# Patient Record
Sex: Male | Born: 1945 | Race: White | Hispanic: No | Marital: Married | Smoking: Former smoker
Health system: Southern US, Community
[De-identification: ages and names within clinical notes are randomized; demographics above are authoritative.]

## PROBLEM LIST (undated history)

## (undated) DIAGNOSIS — I10 Essential (primary) hypertension: Secondary | ICD-10-CM

## (undated) DIAGNOSIS — I2510 Atherosclerotic heart disease of native coronary artery without angina pectoris: Secondary | ICD-10-CM

## (undated) DIAGNOSIS — N400 Enlarged prostate without lower urinary tract symptoms: Secondary | ICD-10-CM

## (undated) DIAGNOSIS — E119 Type 2 diabetes mellitus without complications: Secondary | ICD-10-CM

## (undated) DIAGNOSIS — K08109 Complete loss of teeth, unspecified cause, unspecified class: Secondary | ICD-10-CM

## (undated) DIAGNOSIS — K219 Gastro-esophageal reflux disease without esophagitis: Secondary | ICD-10-CM

## (undated) DIAGNOSIS — M5030 Other cervical disc degeneration, unspecified cervical region: Secondary | ICD-10-CM

## (undated) DIAGNOSIS — I779 Disorder of arteries and arterioles, unspecified: Secondary | ICD-10-CM

## (undated) DIAGNOSIS — J189 Pneumonia, unspecified organism: Secondary | ICD-10-CM

## (undated) DIAGNOSIS — M1990 Unspecified osteoarthritis, unspecified site: Secondary | ICD-10-CM

## (undated) DIAGNOSIS — J449 Chronic obstructive pulmonary disease, unspecified: Secondary | ICD-10-CM

## (undated) DIAGNOSIS — R0600 Dyspnea, unspecified: Secondary | ICD-10-CM

## (undated) DIAGNOSIS — Z7901 Long term (current) use of anticoagulants: Secondary | ICD-10-CM

## (undated) DIAGNOSIS — E876 Hypokalemia: Secondary | ICD-10-CM

## (undated) DIAGNOSIS — I700 Atherosclerosis of aorta: Secondary | ICD-10-CM

## (undated) DIAGNOSIS — Z7902 Long term (current) use of antithrombotics/antiplatelets: Secondary | ICD-10-CM

## (undated) DIAGNOSIS — I219 Acute myocardial infarction, unspecified: Secondary | ICD-10-CM

## (undated) DIAGNOSIS — N200 Calculus of kidney: Secondary | ICD-10-CM

## (undated) DIAGNOSIS — E7800 Pure hypercholesterolemia, unspecified: Secondary | ICD-10-CM

## (undated) DIAGNOSIS — N189 Chronic kidney disease, unspecified: Secondary | ICD-10-CM

## (undated) DIAGNOSIS — K5790 Diverticulosis of intestine, part unspecified, without perforation or abscess without bleeding: Secondary | ICD-10-CM

## (undated) DIAGNOSIS — N23 Unspecified renal colic: Secondary | ICD-10-CM

## (undated) DIAGNOSIS — C3490 Malignant neoplasm of unspecified part of unspecified bronchus or lung: Secondary | ICD-10-CM

## (undated) HISTORY — PX: CATARACT EXTRACTION, BILATERAL: SHX1313

## (undated) HISTORY — DX: Type 2 diabetes mellitus without complications: E11.9

## (undated) HISTORY — DX: Malignant neoplasm of unspecified part of unspecified bronchus or lung: C34.90

---

## 2005-04-27 ENCOUNTER — Ambulatory Visit: Payer: Self-pay | Admitting: General Surgery

## 2005-04-27 HISTORY — PX: COLONOSCOPY: SHX174

## 2005-11-15 DIAGNOSIS — E876 Hypokalemia: Secondary | ICD-10-CM | POA: Insufficient documentation

## 2005-11-15 DIAGNOSIS — I10 Essential (primary) hypertension: Secondary | ICD-10-CM | POA: Insufficient documentation

## 2006-07-19 DIAGNOSIS — D239 Other benign neoplasm of skin, unspecified: Secondary | ICD-10-CM | POA: Insufficient documentation

## 2008-02-14 ENCOUNTER — Emergency Department: Payer: Self-pay | Admitting: Emergency Medicine

## 2008-09-07 ENCOUNTER — Ambulatory Visit: Payer: Self-pay | Admitting: Family Medicine

## 2010-12-10 ENCOUNTER — Inpatient Hospital Stay: Payer: Self-pay | Admitting: Internal Medicine

## 2010-12-10 DIAGNOSIS — I214 Non-ST elevation (NSTEMI) myocardial infarction: Secondary | ICD-10-CM

## 2010-12-10 HISTORY — DX: Non-ST elevation (NSTEMI) myocardial infarction: I21.4

## 2010-12-11 DIAGNOSIS — Z955 Presence of coronary angioplasty implant and graft: Secondary | ICD-10-CM

## 2010-12-11 HISTORY — PX: CORONARY ANGIOPLASTY WITH STENT PLACEMENT: SHX49

## 2010-12-11 HISTORY — DX: Presence of coronary angioplasty implant and graft: Z95.5

## 2011-01-05 ENCOUNTER — Ambulatory Visit: Payer: Self-pay | Admitting: Internal Medicine

## 2011-01-05 HISTORY — PX: CORONARY STENT INTERVENTION: CATH118234

## 2011-03-06 DIAGNOSIS — I219 Acute myocardial infarction, unspecified: Secondary | ICD-10-CM

## 2011-03-06 HISTORY — PX: CARDIAC CATHETERIZATION: SHX172

## 2011-03-06 HISTORY — DX: Acute myocardial infarction, unspecified: I21.9

## 2011-07-09 ENCOUNTER — Emergency Department: Payer: Self-pay | Admitting: Emergency Medicine

## 2011-07-09 LAB — URINALYSIS, COMPLETE
Bacteria: NONE SEEN
Bilirubin,UR: NEGATIVE
Glucose,UR: NEGATIVE mg/dL (ref 0–75)
Ketone: NEGATIVE
Nitrite: NEGATIVE
Protein: NEGATIVE
RBC,UR: 150 /HPF (ref 0–5)
Specific Gravity: 1.015 (ref 1.003–1.030)
Squamous Epithelial: 1

## 2011-07-09 LAB — LIPASE, BLOOD: Lipase: 180 U/L (ref 73–393)

## 2011-07-09 LAB — COMPREHENSIVE METABOLIC PANEL
Albumin: 4.3 g/dL (ref 3.4–5.0)
Alkaline Phosphatase: 96 U/L (ref 50–136)
Anion Gap: 8 (ref 7–16)
BUN: 19 mg/dL — ABNORMAL HIGH (ref 7–18)
Bilirubin,Total: 0.6 mg/dL (ref 0.2–1.0)
Calcium, Total: 9.2 mg/dL (ref 8.5–10.1)
Co2: 29 mmol/L (ref 21–32)
Creatinine: 1.85 mg/dL — ABNORMAL HIGH (ref 0.60–1.30)
EGFR (African American): 43 — ABNORMAL LOW
EGFR (Non-African Amer.): 37 — ABNORMAL LOW
Potassium: 4.3 mmol/L (ref 3.5–5.1)
SGPT (ALT): 30 U/L
Sodium: 141 mmol/L (ref 136–145)
Total Protein: 8 g/dL (ref 6.4–8.2)

## 2011-07-09 LAB — CBC
HCT: 48 % (ref 40.0–52.0)
MCH: 29.3 pg (ref 26.0–34.0)
WBC: 10.1 10*3/uL (ref 3.8–10.6)

## 2011-09-21 ENCOUNTER — Emergency Department: Payer: Self-pay | Admitting: Emergency Medicine

## 2011-09-21 LAB — CBC
HCT: 46.8 % (ref 40.0–52.0)
MCH: 29.5 pg (ref 26.0–34.0)
MCV: 89 fL (ref 80–100)
RDW: 14.7 % — ABNORMAL HIGH (ref 11.5–14.5)
WBC: 10.5 10*3/uL (ref 3.8–10.6)

## 2011-09-21 LAB — URINALYSIS, COMPLETE
Ketone: NEGATIVE
Nitrite: NEGATIVE
Ph: 5 (ref 4.5–8.0)
Protein: NEGATIVE
Specific Gravity: 1.024 (ref 1.003–1.030)
WBC UR: 4 /HPF (ref 0–5)

## 2011-09-21 LAB — COMPREHENSIVE METABOLIC PANEL
Bilirubin,Total: 0.6 mg/dL (ref 0.2–1.0)
Chloride: 105 mmol/L (ref 98–107)
Co2: 30 mmol/L (ref 21–32)
EGFR (African American): 38 — ABNORMAL LOW
EGFR (Non-African Amer.): 33 — ABNORMAL LOW
SGOT(AST): 25 U/L (ref 15–37)
SGPT (ALT): 34 U/L
Total Protein: 7.7 g/dL (ref 6.4–8.2)

## 2011-10-02 ENCOUNTER — Emergency Department: Payer: Self-pay | Admitting: Emergency Medicine

## 2011-10-02 LAB — BASIC METABOLIC PANEL
BUN: 21 mg/dL — ABNORMAL HIGH (ref 7–18)
Calcium, Total: 8.4 mg/dL — ABNORMAL LOW (ref 8.5–10.1)
Chloride: 106 mmol/L (ref 98–107)
Creatinine: 1.88 mg/dL — ABNORMAL HIGH (ref 0.60–1.30)
EGFR (African American): 42 — ABNORMAL LOW
EGFR (Non-African Amer.): 36 — ABNORMAL LOW
Glucose: 97 mg/dL (ref 65–99)
Potassium: 4.2 mmol/L (ref 3.5–5.1)
Sodium: 139 mmol/L (ref 136–145)

## 2011-10-02 LAB — CBC
HCT: 42 % (ref 40.0–52.0)
HGB: 14.3 g/dL (ref 13.0–18.0)
MCH: 29.9 pg (ref 26.0–34.0)
MCHC: 34.1 g/dL (ref 32.0–36.0)
MCV: 88 fL (ref 80–100)
RBC: 4.78 10*6/uL (ref 4.40–5.90)

## 2011-10-02 LAB — URINALYSIS, COMPLETE
Bacteria: NONE SEEN
Bilirubin,UR: NEGATIVE
Glucose,UR: NEGATIVE mg/dL (ref 0–75)
Ketone: NEGATIVE
Ph: 5 (ref 4.5–8.0)
Protein: NEGATIVE
RBC,UR: 5 /HPF (ref 0–5)
Squamous Epithelial: <1

## 2011-10-10 ENCOUNTER — Ambulatory Visit: Payer: Self-pay | Admitting: Urology

## 2011-10-11 ENCOUNTER — Ambulatory Visit: Payer: Self-pay | Admitting: Urology

## 2014-05-20 DIAGNOSIS — S76011A Strain of muscle, fascia and tendon of right hip, initial encounter: Secondary | ICD-10-CM | POA: Diagnosis not present

## 2014-05-31 DIAGNOSIS — N390 Urinary tract infection, site not specified: Secondary | ICD-10-CM | POA: Diagnosis not present

## 2014-07-02 DIAGNOSIS — I10 Essential (primary) hypertension: Secondary | ICD-10-CM | POA: Diagnosis not present

## 2014-07-02 DIAGNOSIS — I252 Old myocardial infarction: Secondary | ICD-10-CM | POA: Diagnosis not present

## 2014-07-02 DIAGNOSIS — E782 Mixed hyperlipidemia: Secondary | ICD-10-CM | POA: Diagnosis not present

## 2014-07-08 DIAGNOSIS — E782 Mixed hyperlipidemia: Secondary | ICD-10-CM | POA: Diagnosis not present

## 2014-07-08 DIAGNOSIS — I10 Essential (primary) hypertension: Secondary | ICD-10-CM | POA: Diagnosis not present

## 2014-08-05 DIAGNOSIS — E669 Obesity, unspecified: Secondary | ICD-10-CM | POA: Diagnosis not present

## 2014-08-05 DIAGNOSIS — I2510 Atherosclerotic heart disease of native coronary artery without angina pectoris: Secondary | ICD-10-CM | POA: Diagnosis not present

## 2014-08-05 DIAGNOSIS — I209 Angina pectoris, unspecified: Secondary | ICD-10-CM | POA: Diagnosis not present

## 2014-08-05 DIAGNOSIS — E785 Hyperlipidemia, unspecified: Secondary | ICD-10-CM | POA: Diagnosis not present

## 2014-10-06 ENCOUNTER — Telehealth: Payer: Self-pay

## 2014-10-06 ENCOUNTER — Other Ambulatory Visit: Payer: Self-pay

## 2014-10-06 NOTE — Telephone Encounter (Signed)
Refill request received from [facility] requesting Naproxen 500 mg.

## 2014-10-07 MED ORDER — NAPROXEN 500 MG PO TABS: 500.0000 mg | ORAL_TABLET | Freq: Two times a day (BID) | ORAL | Status: DC

## 2014-10-08 ENCOUNTER — Ambulatory Visit (INDEPENDENT_AMBULATORY_CARE_PROVIDER_SITE_OTHER): Payer: Medicare Other | Admitting: Family Medicine

## 2014-10-08 ENCOUNTER — Encounter: Payer: Self-pay | Admitting: Family Medicine

## 2014-10-08 ENCOUNTER — Other Ambulatory Visit: Payer: Self-pay | Admitting: Family Medicine

## 2014-10-08 VITALS — BP 140/78 | HR 61 | Temp 97.8°F | Resp 16 | Wt 200.0 lb

## 2014-10-08 DIAGNOSIS — R319 Hematuria, unspecified: Secondary | ICD-10-CM

## 2014-10-08 DIAGNOSIS — M1990 Unspecified osteoarthritis, unspecified site: Secondary | ICD-10-CM | POA: Insufficient documentation

## 2014-10-08 DIAGNOSIS — I10 Essential (primary) hypertension: Secondary | ICD-10-CM

## 2014-10-08 DIAGNOSIS — E785 Hyperlipidemia, unspecified: Secondary | ICD-10-CM | POA: Insufficient documentation

## 2014-10-08 DIAGNOSIS — K219 Gastro-esophageal reflux disease without esophagitis: Secondary | ICD-10-CM | POA: Insufficient documentation

## 2014-10-08 DIAGNOSIS — I252 Old myocardial infarction: Secondary | ICD-10-CM | POA: Insufficient documentation

## 2014-10-08 LAB — POCT URINALYSIS DIPSTICK
BILIRUBIN UA: NEGATIVE
Blood, UA: INVALID
Glucose, UA: NEGATIVE
Ketones, UA: NEGATIVE
Leukocytes, UA: NEGATIVE
NITRITE UA: NEGATIVE
PH UA: 6.5
Protein, UA: NEGATIVE
Spec Grav, UA: 1.01
UROBILINOGEN UA: 0.2

## 2014-10-08 MED ORDER — CIPROFLOXACIN HCL 500 MG PO TABS: 500.0000 mg | ORAL_TABLET | Freq: Two times a day (BID) | ORAL | Status: DC

## 2014-10-08 NOTE — Progress Notes (Signed)
Patient ID: Luke Rojas, male   DOB: 1945-11-24, 69 y.o.   MRN: [id number]   Patient: Luke Rojas Male    DOB: 09-Feb-1946   69 y.o.   MRN: [id number] Visit Date: 10/08/2014  Today's Provider: Vernie Murders, PA   Chief Complaint  Patient presents with  . Hematuria    X 2 days   Subjective:    HPI This 69 year old male noticed blood in urine yesterday without discomfort. Has had multiple kidney stones in the past. No nausea, vomiting, dysuria or urgency. Still has some decrease in stream intermittently. No nocturia.  Patient Active Problem List   Diagnosis Date Noted  . Arthritis 10/08/2014  . H/O acute myocardial infarction 10/08/2014  . Acid reflux 10/08/2014  . HLD (hyperlipidemia) 10/08/2014  . BP (high blood pressure) 10/08/2014  . 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 Benign neoplasm of skin 07/19/2006  . Essential (primary) hypertension 11/15/2005  . Decreased potassium in the blood 11/15/2005   Past Surgical History  Procedure Laterality Date  . No past surgeries     Family History  Problem Relation Age of Onset  . Pulmonary embolism Mother   . Transient ischemic attack Mother   . Pancreatic cancer Father   . Hypertension Father   . Cirrhosis Brother    Allergies  Allergen Reactions  . Codeine Nausea Only      Previous Medications   ASPIRIN 81 MG TABLET    Take 1 tablet by mouth daily.   ATORVASTATIN (LIPITOR) 40 MG TABLET    Take 1 tablet by mouth daily.   METOPROLOL (LOPRESSOR) 50 MG TABLET    Take 1 tablet by mouth. Morning and night   MULTIPLE VITAMIN PO    Take 1 tablet by mouth daily.   NAPROXEN (NAPROSYN) 500 MG TABLET    Take 1 tablet (500 mg total) by mouth 2 (two) times daily.   NITROGLYCERIN (NITROSTAT) 0.4 MG SL TABLET    Place 1 tablet under the tongue. Every 5 minutes X 3 PRN chest pain   OMEGA-3 FATTY ACIDS (FISH OIL) 1200 MG CAPS    Take 1 capsule by mouth 2 (two) times daily.   RAMIPRIL (ALTACE) 10 MG  CAPSULE    Take 1 capsule by mouth daily.    Review of Systems  Constitutional: Negative.   HENT: Negative.   Respiratory: Negative.   Cardiovascular: Negative.   Gastrointestinal: Negative.   Genitourinary: Positive for hematuria. Negative for dysuria, urgency, frequency and flank pain.  Musculoskeletal: Positive for arthralgias.       Stiffness and ache/soreness in both hands. Controlled by use of Naproxen.   History  Substance Use Topics  . Smoking status: Former Research scientist (life sciences)  . Smokeless tobacco: Not on file     Comment: [place]  . Alcohol Use: No   Objective:   BP 140/78 mmHg  Pulse 61  Temp(Src) 97.8 F (36.6 C) (Oral)  Resp 16  Wt 200 lb (90.719 kg)  SpO2 97%  Physical Exam  Constitutional: He is oriented to person, place, and time. He appears well-developed and well-nourished. No distress.  HENT:  Head: Normocephalic and atraumatic.  Right Ear: Hearing normal.  Left Ear: Hearing normal.  Nose: Nose normal.  Mouth/Throat: Oropharynx is clear and moist.  Eyes: Conjunctivae, EOM and lids are normal. Right eye exhibits no discharge. Left eye exhibits no discharge. No scleral icterus.  Cardiovascular: Normal rate and regular rhythm.   Pulmonary/Chest: Effort normal and  breath sounds normal. No respiratory distress.  Abdominal: Soft. Bowel sounds are normal.  Genitourinary: Rectum normal, prostate normal and penis normal. Guaiac negative stool.  Musculoskeletal: Normal range of motion.  Neurological: He is alert and oriented to person, place, and time.  Skin: Skin is intact. No lesion and no rash noted.  Psychiatric: He has a normal mood and affect. His speech is normal and behavior is normal. Thought content normal.      Assessment & Plan:        1. Hematuria Onset yesterday with very little symptoms. No pain or nausea. History of past renal stones. Will get C&S and start antibiotic. Increase fluid intake and if no sign of infection will need to get renal  ultrasound. May need referral to urologist. If pain develops or hematuria worsens, should go to ER. - POCT urinalysis dipstick - Urine Culture - ciprofloxacin (CIPRO) 500 MG tablet; Take 1 tablet (500 mg total) by mouth 2 (two) times daily.  Dispense: 20 tablet; Refill: 0 - CBC with Differential/Platelet

## 2014-10-09 LAB — CBC WITH DIFFERENTIAL/PLATELET
BASOS: 0 %
Basophils Absolute: 0 10*3/uL (ref 0.0–0.2)
EOS (ABSOLUTE): 0.5 10*3/uL — ABNORMAL HIGH (ref 0.0–0.4)
EOS: 6 %
HEMATOCRIT: 45.9 % (ref 37.5–51.0)
HEMOGLOBIN: 15.4 g/dL (ref 12.6–17.7)
IMMATURE GRANS (ABS): 0 10*3/uL (ref 0.0–0.1)
Immature Granulocytes: 0 %
Lymphocytes Absolute: 3 10*3/uL (ref 0.7–3.1)
Lymphs: 40 %
MCH: 29.5 pg (ref 26.6–33.0)
MCHC: 33.6 g/dL (ref 31.5–35.7)
MCV: 88 fL (ref 79–97)
MONOCYTES: 9 %
Monocytes Absolute: 0.7 10*3/uL (ref 0.1–0.9)
NEUTROS PCT: 45 %
Neutrophils Absolute: 3.4 10*3/uL (ref 1.4–7.0)
Platelets: 163 10*3/uL (ref 150–379)
RBC: 5.22 x10E6/uL (ref 4.14–5.80)
RDW: 14.7 % (ref 12.3–15.4)
WBC: 7.7 10*3/uL (ref 3.4–10.8)

## 2014-10-11 ENCOUNTER — Telehealth: Payer: Self-pay

## 2014-10-11 LAB — URINE CULTURE: Organism ID, Bacteria: NO GROWTH

## 2014-10-11 NOTE — Telephone Encounter (Signed)
-----   Message from Margo Common, [place] sent at 10/11/2014  8:38 AM EDT ----- Blood cell count in good shape. Awaiting urine culture report for infection. If any further blood in urine, will proceed with CT scan to look for obstructing kidney stones.

## 2014-10-11 NOTE — Telephone Encounter (Signed)
Attempted to contact patient. No answer and voicemail has not be set up yet.

## 2014-10-11 NOTE — Telephone Encounter (Signed)
-----   Message from Margo Common, [place] sent at 10/11/2014  8:47 AM EDT ----- Culture report came through and no bacterial infection identified. Should consider CT scan to evaluate for more kidney stones or follow up with his urologist.

## 2014-10-12 NOTE — Telephone Encounter (Signed)
Patient advised as directed below. Patient verbalized understanding. Patient states she would prefer following up with urology before proceeding with CT scan. Patient states he will call back with results from urologist.

## 2014-10-20 DIAGNOSIS — R3914 Feeling of incomplete bladder emptying: Secondary | ICD-10-CM | POA: Diagnosis not present

## 2014-10-20 DIAGNOSIS — R310 Gross hematuria: Secondary | ICD-10-CM | POA: Diagnosis not present

## 2014-10-20 DIAGNOSIS — N200 Calculus of kidney: Secondary | ICD-10-CM | POA: Diagnosis not present

## 2014-10-20 DIAGNOSIS — N401 Enlarged prostate with lower urinary tract symptoms: Secondary | ICD-10-CM | POA: Diagnosis not present

## 2014-10-27 ENCOUNTER — Other Ambulatory Visit: Payer: Self-pay

## 2014-11-02 ENCOUNTER — Encounter
Admission: RE | Admit: 2014-11-02 | Discharge: 2014-11-02 | Disposition: A | Discharge status: Home / Self Care | Payer: Medicare Other | Source: Ambulatory Visit | Attending: Urology | Admitting: Urology

## 2014-11-02 DIAGNOSIS — Z87891 Personal history of nicotine dependence: Secondary | ICD-10-CM | POA: Diagnosis not present

## 2014-11-02 DIAGNOSIS — I2510 Atherosclerotic heart disease of native coronary artery without angina pectoris: Secondary | ICD-10-CM | POA: Diagnosis not present

## 2014-11-02 DIAGNOSIS — Z7902 Long term (current) use of antithrombotics/antiplatelets: Secondary | ICD-10-CM | POA: Diagnosis not present

## 2014-11-02 DIAGNOSIS — I10 Essential (primary) hypertension: Secondary | ICD-10-CM | POA: Diagnosis not present

## 2014-11-02 DIAGNOSIS — Z823 Family history of stroke: Secondary | ICD-10-CM | POA: Diagnosis not present

## 2014-11-02 DIAGNOSIS — Z955 Presence of coronary angioplasty implant and graft: Secondary | ICD-10-CM | POA: Diagnosis not present

## 2014-11-02 DIAGNOSIS — Z7982 Long term (current) use of aspirin: Secondary | ICD-10-CM | POA: Diagnosis not present

## 2014-11-02 DIAGNOSIS — Z791 Long term (current) use of non-steroidal anti-inflammatories (NSAID): Secondary | ICD-10-CM | POA: Diagnosis not present

## 2014-11-02 DIAGNOSIS — Z885 Allergy status to narcotic agent status: Secondary | ICD-10-CM | POA: Diagnosis not present

## 2014-11-02 DIAGNOSIS — Z79899 Other long term (current) drug therapy: Secondary | ICD-10-CM | POA: Diagnosis not present

## 2014-11-02 DIAGNOSIS — N200 Calculus of kidney: Secondary | ICD-10-CM | POA: Diagnosis not present

## 2014-11-02 LAB — BASIC METABOLIC PANEL
Anion gap: 7 (ref 5–15)
BUN: 21 mg/dL — ABNORMAL HIGH (ref 6–20)
CALCIUM: 9 mg/dL (ref 8.9–10.3)
CHLORIDE: 108 mmol/L (ref 101–111)
CO2: 23 mmol/L (ref 22–32)
CREATININE: 1.3 mg/dL — AB (ref 0.61–1.24)
GFR, EST NON AFRICAN AMERICAN: 54 mL/min — AB (ref >60)
Glucose, Bld: 106 mg/dL — ABNORMAL HIGH (ref 65–99)
Potassium: 4.1 mmol/L (ref 3.5–5.1)
SODIUM: 138 mmol/L (ref 135–145)

## 2014-11-04 ENCOUNTER — Encounter
Admission: RE | Disposition: A | Discharge status: Home / Self Care | Payer: Self-pay | Source: Ambulatory Visit | Attending: Urology

## 2014-11-04 ENCOUNTER — Encounter: Payer: Self-pay | Admitting: *Deleted

## 2014-11-04 ENCOUNTER — Ambulatory Visit
Admission: RE | Admit: 2014-11-04 | Discharge: 2014-11-04 | Disposition: A | Discharge status: Home / Self Care | Payer: Medicare Other | Source: Ambulatory Visit | Attending: Urology | Admitting: Urology

## 2014-11-04 DIAGNOSIS — Z79899 Other long term (current) drug therapy: Secondary | ICD-10-CM | POA: Insufficient documentation

## 2014-11-04 DIAGNOSIS — Z7902 Long term (current) use of antithrombotics/antiplatelets: Secondary | ICD-10-CM | POA: Diagnosis not present

## 2014-11-04 DIAGNOSIS — I2510 Atherosclerotic heart disease of native coronary artery without angina pectoris: Secondary | ICD-10-CM | POA: Insufficient documentation

## 2014-11-04 DIAGNOSIS — Z7982 Long term (current) use of aspirin: Secondary | ICD-10-CM | POA: Diagnosis not present

## 2014-11-04 DIAGNOSIS — Z955 Presence of coronary angioplasty implant and graft: Secondary | ICD-10-CM | POA: Insufficient documentation

## 2014-11-04 DIAGNOSIS — N200 Calculus of kidney: Secondary | ICD-10-CM | POA: Diagnosis not present

## 2014-11-04 DIAGNOSIS — Z87891 Personal history of nicotine dependence: Secondary | ICD-10-CM | POA: Diagnosis not present

## 2014-11-04 DIAGNOSIS — I10 Essential (primary) hypertension: Secondary | ICD-10-CM | POA: Insufficient documentation

## 2014-11-04 DIAGNOSIS — Z885 Allergy status to narcotic agent status: Secondary | ICD-10-CM | POA: Diagnosis not present

## 2014-11-04 DIAGNOSIS — Z823 Family history of stroke: Secondary | ICD-10-CM | POA: Insufficient documentation

## 2014-11-04 DIAGNOSIS — Z791 Long term (current) use of non-steroidal anti-inflammatories (NSAID): Secondary | ICD-10-CM | POA: Diagnosis not present

## 2014-11-04 HISTORY — DX: Chronic kidney disease, unspecified: N18.9

## 2014-11-04 HISTORY — DX: Essential (primary) hypertension: I10

## 2014-11-04 HISTORY — PX: EXTRACORPOREAL SHOCK WAVE LITHOTRIPSY: SHX1557

## 2014-11-04 HISTORY — DX: Atherosclerotic heart disease of native coronary artery without angina pectoris: I25.10

## 2014-11-04 SURGERY — LITHOTRIPSY, ESWL
Anesthesia: Moderate Sedation | Laterality: Left

## 2014-11-04 MED ORDER — DOCUSATE SODIUM 100 MG PO CAPS: 200.0000 mg | ORAL_CAPSULE | Freq: Two times a day (BID) | ORAL | Status: DC

## 2014-11-04 MED ORDER — TAMSULOSIN HCL 0.4 MG PO CAPS: 0.4000 mg | ORAL_CAPSULE | Freq: Every day | ORAL | Status: DC

## 2014-11-04 MED ORDER — MIDAZOLAM HCL 2 MG/2ML IJ SOLN
INTRAMUSCULAR | Status: AC
  Administered 2014-11-04: 1 mg via INTRAMUSCULAR
  Filled 2014-11-04: qty 2

## 2014-11-04 MED ORDER — LEVOFLOXACIN 500 MG PO TABS: 500.0000 mg | ORAL_TABLET | Freq: Every day | ORAL | Status: DC

## 2014-11-04 MED ORDER — PROMETHAZINE HCL 25 MG/ML IJ SOLN
25.0000 mg | Freq: Once | INTRAMUSCULAR | Status: AC
  Administered 2014-11-04: 25 mg via INTRAMUSCULAR

## 2014-11-04 MED ORDER — MIDAZOLAM HCL 2 MG/2ML IJ SOLN
1.0000 mg | Freq: Once | INTRAMUSCULAR | Status: AC
  Administered 2014-11-04: 1 mg via INTRAMUSCULAR

## 2014-11-04 MED ORDER — NUCYNTA 50 MG PO TABS: 50.0000 mg | ORAL_TABLET | Freq: Four times a day (QID) | ORAL | Status: DC | PRN

## 2014-11-04 MED ORDER — PROMETHAZINE HCL 25 MG/ML IJ SOLN
INTRAMUSCULAR | Status: AC
  Administered 2014-11-04: 25 mg via INTRAMUSCULAR
  Filled 2014-11-04: qty 1

## 2014-11-04 MED ORDER — DIPHENHYDRAMINE HCL 25 MG PO CAPS
ORAL_CAPSULE | ORAL | Status: AC
  Administered 2014-11-04: 25 mg via ORAL
  Filled 2014-11-04: qty 1

## 2014-11-04 MED ORDER — MORPHINE SULFATE (PF) 10 MG/ML IV SOLN
INTRAVENOUS | Status: AC
  Administered 2014-11-04: 10 mg via INTRAMUSCULAR
  Filled 2014-11-04: qty 1

## 2014-11-04 MED ORDER — ONDANSETRON 8 MG PO TBDP: 8.0000 mg | ORAL_TABLET | Freq: Four times a day (QID) | ORAL | Status: DC | PRN

## 2014-11-04 MED ORDER — LEVOFLOXACIN 500 MG PO TABS
500.0000 mg | ORAL_TABLET | Freq: Once | ORAL | Status: AC
  Administered 2014-11-04: 500 mg via ORAL

## 2014-11-04 MED ORDER — LEVOFLOXACIN 500 MG PO TABS
ORAL_TABLET | ORAL | Status: AC
  Administered 2014-11-04: 500 mg via ORAL
  Filled 2014-11-04: qty 1

## 2014-11-04 MED ORDER — MORPHINE SULFATE (PF) 10 MG/ML IV SOLN
10.0000 mg | Freq: Once | INTRAVENOUS | Status: AC
  Administered 2014-11-04: 10 mg via INTRAMUSCULAR

## 2014-11-04 MED ORDER — PROMETHAZINE HCL 25 MG RE SUPP: 25.0000 mg | Freq: Four times a day (QID) | RECTAL | Status: DC | PRN

## 2014-11-04 MED ORDER — DEXTROSE-NACL 5-0.45 % IV SOLN
INTRAVENOUS | Status: DC
  Administered 2014-11-04: 12:00:00 via INTRAVENOUS

## 2014-11-04 MED ORDER — DIPHENHYDRAMINE HCL 25 MG PO CAPS
25.0000 mg | ORAL_CAPSULE | ORAL | Status: AC
  Administered 2014-11-04: 25 mg via ORAL

## 2014-11-04 NOTE — Discharge Instructions (Addendum)
Dietary Guidelines to Help Prevent Kidney Stones Your risk of kidney stones can be decreased by adjusting the foods you eat. The most important thing you can do is drink enough fluid. You should drink enough fluid to keep your urine clear or pale yellow. The following guidelines provide specific information for the type of kidney stone you have had. GUIDELINES ACCORDING TO TYPE OF KIDNEY STONE Calcium Oxalate Kidney Stones  Reduce the amount of salt you eat. Foods that have a lot of salt cause your body to release excess calcium into your urine. The excess calcium can combine with a substance called oxalate to form kidney stones.  Reduce the amount of animal protein you eat if the amount you eat is excessive. Animal protein causes your body to release excess calcium into your urine. Ask your dietitian how much protein from animal sources you should be eating.  Avoid foods that are high in oxalates. If you take vitamins, they should have less than 500 mg of vitamin C. Your body turns vitamin C into oxalates. You do not need to avoid fruits and vegetables high in vitamin C. Calcium Phosphate Kidney Stones  Reduce the amount of salt you eat to help prevent the release of excess calcium into your urine.  Reduce the amount of animal protein you eat if the amount you eat is excessive. Animal protein causes your body to release excess calcium into your urine. Ask your dietitian how much protein from animal sources you should be eating.  Get enough calcium from food or take a calcium supplement (ask your dietitian for recommendations). Food sources of calcium that do not increase your risk of kidney stones include:  Broccoli.  Dairy products, such as cheese and yogurt.  Pudding. Uric Acid Kidney Stones  Do not have more than 6 oz of animal protein per day. FOOD SOURCES Animal Protein Sources  Meat (all types).  Poultry.  Eggs.  Fish, seafood. Foods High in [employer] seasonings.  Soy  sauce.  Teriyaki sauce.  Cured and processed meats.  Salted crackers and snack foods.  Fast food.  Canned soups and most canned foods. Foods High in Oxalates  Grains:  Amaranth.  Barley.  Grits.  Wheat germ.  Bran.  Buckwheat flour.  All bran cereals.  Pretzels.  Whole wheat bread.  Vegetables:  Beans (wax).  Beets and beet greens.  Collard greens.  Eggplant.  [place]scarole.  Leeks.  Okra.  Parsley.  Rutabagas.  Spinach.  Swiss chard.  Tomato paste.  Fried potatoes.  Sweet potatoes.  Fruits:  Red currants.  Figs.  Kiwi.  Rhubarb.  Meat and Other Protein Sources:  Beans (dried).  Soy burgers and other soybean products.  Miso.  Nuts (peanuts, almonds, pecans, cashews, hazelnuts).  Nut butters.  Sesame seeds and tahini (paste made of sesame seeds).  Poppy seeds.  Beverages:  Chocolate drink mixes.  Soy milk.  Instant iced tea.  Juices made from high-oxalate fruits or vegetables.  Other:  Carob.  Chocolate.  Fruitcake.  Marmalades. Document Released: 06/16/2010 Document Revised: 02/24/2013 Document Reviewed: 01/16/2013 [facility] Patient Information 2015 [place], [place]. This information is not intended to replace advice given to you by your health care provider. Make sure you discuss any questions you have with your health care provider.  Lithotripsy for Kidney Stones Lithotripsy is a treatment that can sometimes help eliminate kidney stones and pain that they cause. A form of lithotripsy, also known as extracorporeal shock wave lithotripsy, is a nonsurgical procedure that  helps your body rid itself of the kidney stone when it is too big to pass on its own. Extracorporeal shock wave lithotripsy is a method of crushing a kidney stone with shock waves. These shock waves pass through your body and are focused on your stone. They cause the kidney stones to crumble while still in the urinary tract. It is then easier for  the smaller pieces of stone to pass in the urine. Lithotripsy usually takes about an hour. It is done in a hospital, a lithotripsy center, or a mobile unit. It usually does not require an overnight stay. Your health care provider will instruct you on preparation for the procedure. Your health care provider will tell you what to expect afterward. LET [facility] CARE PROVIDER KNOW ABOUT:  Any allergies you have.  All medicines you are taking, including vitamins, herbs, eye drops, creams, and over-the-counter medicines.  Previous problems you or members of your family have had with the use of anesthetics.  Any blood disorders you have.  Previous surgeries you have had.  Medical conditions you have. RISKS AND COMPLICATIONS Generally, lithotripsy for kidney stones is a safe procedure. However, as with any procedure, complications can occur. Possible complications include:  Infection.  Bleeding of the kidney.  Bruising of the kidney or skin.  Obstruction of the ureter.  Failure of the stone to fragment. BEFORE THE PROCEDURE  Do not eat or drink for 6-8 hours prior to the procedure. You may, however, take the medications with a sip of water that your physician instructs you to take  Do not take aspirin or aspirin-containing products for 7 days prior to your procedure  Do not take nonsteroidal anti-inflammatory products for 7 days prior to your procedure PROCEDURE A stent (flexible tube with holes) may be placed in your ureter. The ureter is the tube that transports the urine from the kidneys to the bladder. Your health care provider may place a stent before the procedure. This will help keep urine flowing from the kidney if the fragments of the stone block the ureter. You may have an IV tube placed in one of your veins to give you fluids and medicines. These medicines may help you relax or make you sleep. During the procedure, you will lie comfortably on a fluid-filled cushion or in a  warm-water bath. After an X-ray or ultrasound exam to locate your stone, shock waves are aimed at the stone. If you are awake, you may feel a tapping sensation as the shock waves pass through your body. If large stone particles remain after treatment, a second procedure may be necessary at a later date. For comfort during the test:  Relax as much as possible.  Try to remain still as much as possible.  Try to follow instructions to speed up the test.  Let your health care provider know if you are uncomfortable, anxious, or in pain. AFTER THE PROCEDURE  After surgery, you will be taken to the recovery area. A nurse will watch and check your progress. Once you're awake, stable, and taking fluids well, you will be allowed to go home as long as there are no problems. You will also be allowed to pass your urine before discharge.You may be given antibiotics to help prevent infection. You may also be prescribed pain medicine if needed. In a week or two, your health care provider may remove your stent, if you have one. You may first have an X-ray exam to check on how successful the fragmentation  of your stone has been and how much of the stone has passed. Your health care provider will check to see whether or not stone particles remain. SEEK IMMEDIATE MEDICAL CARE IF:  You develop a fever or shaking chills.  Your pain is not relieved by medicine.  You feel sick to your stomach (nauseated) and you vomit.  You develop heavy bleeding.  You have difficulty urinating.  You start to pass your stent from your penis. Document Released: 02/17/2000 Document Revised: 12/10/2012 Document Reviewed: 09/04/2012 [facility] Patient Information 2015 [place], [place]. This information is not intended to replace advice given to you by your health care provider. Make sure you discuss any questions you have with your health care provider.  Kidney Stones Kidney stones (urolithiasis) are deposits that form inside your  kidneys. The intense pain is caused by the stone moving through the urinary tract. When the stone moves, the ureter goes into spasm around the stone. The stone is usually passed in the urine.  CAUSES   A disorder that makes certain neck glands produce too much parathyroid hormone (primary hyperparathyroidism).  A buildup of uric acid crystals, similar to gout in your joints.  Narrowing (stricture) of the ureter.  A kidney obstruction present at birth (congenital obstruction).  Previous surgery on the kidney or ureters.  Numerous kidney infections. SYMPTOMS   Feeling sick to your stomach (nauseous).  Throwing up (vomiting).  Blood in the urine (hematuria).  Pain that usually spreads (radiates) to the groin.  Frequency or urgency of urination. DIAGNOSIS   Taking a history and physical exam.  Blood or urine tests.  CT scan.  Occasionally, an examination of the inside of the urinary bladder (cystoscopy) is performed. TREATMENT   Observation.  Increasing your fluid intake.  Extracorporeal shock wave lithotripsy--This is a noninvasive procedure that uses shock waves to break up kidney stones.  Surgery may be needed if you have severe pain or persistent obstruction. There are various surgical procedures. Most of the procedures are performed with the use of small instruments. Only small incisions are needed to accommodate these instruments, so recovery time is minimized. The size, location, and chemical composition are all important variables that will determine the proper choice of action for you. Talk to your health care provider to better understand your situation so that you will minimize the risk of injury to yourself and your kidney.  HOME CARE INSTRUCTIONS   Drink enough water and fluids to keep your urine clear or pale yellow. This will help you to pass the stone or stone fragments.  Strain all urine through the provided strainer. Keep all particulate matter and stones  for your health care provider to see. The stone causing the pain may be as small as a grain of salt. It is very important to use the strainer each and every time you pass your urine. The collection of your stone will allow your health care provider to analyze it and verify that a stone has actually passed. The stone analysis will often identify what you can do to reduce the incidence of recurrences.  Only take over-the-counter or prescription medicines for pain, discomfort, or fever as directed by your health care provider.  Make a follow-up appointment with your health care provider as directed.  Get follow-up X-rays if required. The absence of pain does not always mean that the stone has passed. It may have only stopped moving. If the urine remains completely obstructed, it can cause loss of kidney function or even  complete destruction of the kidney. It is your responsibility to make sure X-rays and follow-ups are completed. Ultrasounds of the kidney can show blockages and the status of the kidney. Ultrasounds are not associated with any radiation and can be performed easily in a matter of minutes. SEEK MEDICAL CARE IF:  You experience pain that is progressive and unresponsive to any pain medicine you have been prescribed. SEEK IMMEDIATE MEDICAL CARE IF:   Pain cannot be controlled with the prescribed medicine.  You have a fever or shaking chills.  The severity or intensity of pain increases over 18 hours and is not relieved by pain medicine.  You develop a new onset of abdominal pain.  You feel faint or pass out.  You are unable to urinate. MAKE SURE YOU:   Understand these instructions.  Will watch your condition.  Will get help right away if you are not doing well or get worse. Document Released: 02/19/2005 Document Revised: 10/22/2012 Document Reviewed: 07/23/2012 [facility] Patient Information 2015 [place], [place]. This information is not intended to replace advice given to you by  your health care provider. Make sure you discuss any questions you have with your health care provider.

## 2014-11-11 DIAGNOSIS — N200 Calculus of kidney: Secondary | ICD-10-CM | POA: Diagnosis not present

## 2014-11-12 ENCOUNTER — Encounter: Payer: Self-pay | Admitting: Urology

## 2014-12-07 DIAGNOSIS — R3915 Urgency of urination: Secondary | ICD-10-CM | POA: Diagnosis not present

## 2014-12-07 DIAGNOSIS — N401 Enlarged prostate with lower urinary tract symptoms: Secondary | ICD-10-CM | POA: Diagnosis not present

## 2014-12-07 DIAGNOSIS — R3914 Feeling of incomplete bladder emptying: Secondary | ICD-10-CM | POA: Diagnosis not present

## 2014-12-07 DIAGNOSIS — N200 Calculus of kidney: Secondary | ICD-10-CM | POA: Diagnosis not present

## 2015-01-10 ENCOUNTER — Telehealth: Payer: Self-pay

## 2015-01-10 ENCOUNTER — Other Ambulatory Visit: Payer: Self-pay | Admitting: Family Medicine

## 2015-01-10 DIAGNOSIS — M1990 Unspecified osteoarthritis, unspecified site: Secondary | ICD-10-CM

## 2015-01-10 MED ORDER — NAPROXEN 500 MG PO TABS: 500.0000 mg | ORAL_TABLET | Freq: Two times a day (BID) | ORAL | Status: DC

## 2015-01-10 NOTE — Telephone Encounter (Signed)
Refill request received from [facility] requesting Naproxen 500 mg tablet.

## 2015-01-11 NOTE — Telephone Encounter (Signed)
Dennis sent RX to pharmacy.

## 2015-02-07 ENCOUNTER — Other Ambulatory Visit: Payer: Self-pay

## 2015-02-07 ENCOUNTER — Ambulatory Visit (INDEPENDENT_AMBULATORY_CARE_PROVIDER_SITE_OTHER): Payer: Medicare Other | Admitting: Family Medicine

## 2015-02-07 ENCOUNTER — Encounter: Payer: Self-pay | Admitting: Family Medicine

## 2015-02-07 VITALS — BP 142/68 | HR 66 | Temp 97.9°F | Resp 14 | Wt 195.6 lb

## 2015-02-07 DIAGNOSIS — J069 Acute upper respiratory infection, unspecified: Secondary | ICD-10-CM | POA: Diagnosis not present

## 2015-02-07 DIAGNOSIS — L723 Sebaceous cyst: Secondary | ICD-10-CM

## 2015-02-07 DIAGNOSIS — J029 Acute pharyngitis, unspecified: Secondary | ICD-10-CM

## 2015-02-07 MED ORDER — FIRST-DUKES MOUTHWASH MT SUSP: OROMUCOSAL | Status: DC

## 2015-02-07 NOTE — Progress Notes (Signed)
Patient ID: Luke Rojas, male   DOB: 02/06/1946, 69 y.o.   MRN: [id number] Name: Luke Rojas   MRN: [id number]    DOB: 06-25-1945   Date:02/07/2015       Progress Note  Subjective  Chief Complaint  Chief Complaint  Patient presents with  . URI    URI  This is a new problem. The current episode started in the past 7 days. The problem has been unchanged. There has been no fever. Associated symptoms include coughing, rhinorrhea and a sore throat. Pertinent negatives include no ear pain or sinus pain. Associated symptoms comments: Swollen sensation in throat. Ticklish cough without sputum production.. He has tried nothing for the symptoms.   Past Surgical History  Procedure Laterality Date  . Cardiac catheterization  2012    2 stents  . Extracorporeal shock wave lithotripsy Left 11/04/2014    Procedure: EXTRACORPOREAL SHOCK WAVE LITHOTRIPSY (ESWL);  Surgeon: Royston Cowper, MD;  Location: [facility] ORS;  Service: Urology;  Laterality: Left;   Family History  Problem Relation Age of Onset  . Pulmonary embolism Mother   . Transient ischemic attack Mother   . Pancreatic cancer Father   . Hypertension Father   . Cirrhosis Brother     Past Medical History  Diagnosis Date  . Hypertension   . Coronary artery disease   . Chronic kidney disease     kidney stones    Social History  Substance Use Topics  . Smoking status: Former Smoker    Quit date: 11/03/2012  . Smokeless tobacco: Not on file     Comment: [place]  . Alcohol Use: No     Current outpatient prescriptions:  .  atorvastatin (LIPITOR) 40 MG tablet, Take 1 tablet by mouth daily., Disp: , Rfl:  .  clopidogrel (PLAVIX) 75 MG tablet, , Disp: , Rfl:  .  docusate sodium (COLACE) 100 MG capsule, Take 2 capsules (200 mg total) by mouth 2 (two) times daily., Disp: 120 capsule, Rfl: 3 .  metoprolol (LOPRESSOR) 50 MG tablet, Take 1 tablet by mouth. Morning and night, Disp: , Rfl:  .  MULTIPLE VITAMIN PO, Take 1  tablet by mouth daily., Disp: , Rfl:  .  naproxen (NAPROSYN) 500 MG tablet, Take 1 tablet (500 mg total) by mouth 2 (two) times daily., Disp: 60 tablet, Rfl: 3 .  nitroGLYCERIN (NITROSTAT) 0.4 MG SL tablet, Place 1 tablet under the tongue. Every 5 minutes X 3 PRN chest pain, Disp: , Rfl:  .  NUCYNTA 50 MG TABS tablet, Take 1 tablet (50 mg total) by mouth every 6 (six) hours as needed for moderate pain. 1 TO 2 TABS Q 6 HOURS PRN PAIN, Disp: 30 tablet, Rfl: 0 .  Omega-3 Fatty Acids (FISH OIL) 1200 MG CAPS, Take 1 capsule by mouth 2 (two) times daily., Disp: , Rfl:  .  omeprazole (PRILOSEC) 20 MG capsule, , Disp: , Rfl:  .  ondansetron (ZOFRAN ODT) 8 MG disintegrating tablet, Take 1 tablet (8 mg total) by mouth every 6 (six) hours as needed for nausea or vomiting., Disp: 10 tablet, Rfl: 3 .  Potassium Citrate 15 MEQ (1620 MG) TBCR, , Disp: , Rfl:  .  promethazine (PHENERGAN) 25 MG suppository, Place 1 suppository (25 mg total) rectally every 6 (six) hours as needed for nausea or vomiting., Disp: 10 suppository, Rfl: 3 .  ramipril (ALTACE) 10 MG capsule, Take 1 capsule by mouth daily., Disp: , Rfl:  .  tamsulosin (FLOMAX)  0.4 MG CAPS capsule, Take 1 capsule (0.4 mg total) by mouth daily., Disp: 30 capsule, Rfl: 11  Allergies  Allergen Reactions  . Codeine Nausea Only    Review of Systems  Constitutional: Negative.   HENT: Positive for rhinorrhea and sore throat. Negative for ear pain.   Eyes: Positive for discharge.  Respiratory: Positive for cough.   Cardiovascular: Negative.   Genitourinary: Negative.   Musculoskeletal: Negative.   Skin: Negative.   Neurological: Negative.   Endo/Heme/Allergies: Negative.   Psychiatric/Behavioral: Negative.    Objective  Filed Vitals:   02/07/15 1431  BP: 142/68  Pulse: 66  Temp: 97.9 F (36.6 C)  TempSrc: Oral  Resp: 14  Weight: 195 lb 9.6 oz (88.724 kg)  SpO2: 98%    Physical Exam  Constitutional: He is oriented to person, place, and time  and well-developed, well-nourished, and in no distress.  HENT:  Head: Normocephalic.  Right Ear: External ear normal.  Left Ear: External ear normal.  Slightly reddened posterior pharynx.   Eyes: Conjunctivae and EOM are normal.  Neck: Normal range of motion. Neck supple.  Cardiovascular: Normal rate and regular rhythm.   Pulmonary/Chest: Effort normal and breath sounds normal.  Abdominal: Soft. Bowel sounds are normal.  Neurological: He is alert and oriented to person, place, and time.    Assessment & Plan  1. Sore throat Recent onset. Will treat with Duke's Mouthwash to help with sore throat and irritation to left lower gum under new dentures. Recheck with dentist regarding fit of dentures. - Diphenhyd-Hydrocort-Nystatin (FIRST-DUKES MOUTHWASH) SUSP; Gargle and swallow one teaspoon after meals and at bedtime.  Dispense: 237 mL; Refill: 0  2. Upper respiratory infection Onset over the past 3-4 days. May use Mucinex-DM for cough and increase fluid intake. Recheck prn.  3. Sebaceous cyst Stable 2-3 cm soft sebaceous cyst on the right mid back. No redness or pain. If any progression or pain, may need referral to surgeon/dermatologist to excise lesion.

## 2015-02-15 ENCOUNTER — Encounter: Payer: Self-pay | Admitting: *Deleted

## 2015-03-10 DIAGNOSIS — N200 Calculus of kidney: Secondary | ICD-10-CM | POA: Diagnosis not present

## 2015-03-23 ENCOUNTER — Encounter: Payer: Self-pay | Admitting: *Deleted

## 2015-03-24 ENCOUNTER — Ambulatory Visit
Admission: RE | Admit: 2015-03-24 | Discharge: 2015-03-24 | Disposition: A | Discharge status: Home / Self Care | Payer: Medicare Other | Source: Ambulatory Visit | Attending: Urology | Admitting: Urology

## 2015-03-24 ENCOUNTER — Encounter
Admission: RE | Disposition: A | Discharge status: Home / Self Care | Payer: Self-pay | Source: Ambulatory Visit | Attending: Urology

## 2015-03-24 DIAGNOSIS — Z79899 Other long term (current) drug therapy: Secondary | ICD-10-CM | POA: Insufficient documentation

## 2015-03-24 DIAGNOSIS — Z9889 Other specified postprocedural states: Secondary | ICD-10-CM | POA: Insufficient documentation

## 2015-03-24 DIAGNOSIS — I252 Old myocardial infarction: Secondary | ICD-10-CM | POA: Diagnosis not present

## 2015-03-24 DIAGNOSIS — Z885 Allergy status to narcotic agent status: Secondary | ICD-10-CM | POA: Diagnosis not present

## 2015-03-24 DIAGNOSIS — Z823 Family history of stroke: Secondary | ICD-10-CM | POA: Diagnosis not present

## 2015-03-24 DIAGNOSIS — N200 Calculus of kidney: Secondary | ICD-10-CM | POA: Diagnosis not present

## 2015-03-24 DIAGNOSIS — I2510 Atherosclerotic heart disease of native coronary artery without angina pectoris: Secondary | ICD-10-CM | POA: Insufficient documentation

## 2015-03-24 DIAGNOSIS — Z791 Long term (current) use of non-steroidal anti-inflammatories (NSAID): Secondary | ICD-10-CM | POA: Diagnosis not present

## 2015-03-24 DIAGNOSIS — Z95818 Presence of other cardiac implants and grafts: Secondary | ICD-10-CM | POA: Diagnosis not present

## 2015-03-24 DIAGNOSIS — I10 Essential (primary) hypertension: Secondary | ICD-10-CM | POA: Diagnosis not present

## 2015-03-24 DIAGNOSIS — Z7902 Long term (current) use of antithrombotics/antiplatelets: Secondary | ICD-10-CM | POA: Diagnosis not present

## 2015-03-24 DIAGNOSIS — Z87891 Personal history of nicotine dependence: Secondary | ICD-10-CM | POA: Insufficient documentation

## 2015-03-24 HISTORY — PX: EXTRACORPOREAL SHOCK WAVE LITHOTRIPSY: SHX1557

## 2015-03-24 HISTORY — DX: Pure hypercholesterolemia, unspecified: E78.00

## 2015-03-24 HISTORY — DX: Gastro-esophageal reflux disease without esophagitis: K21.9

## 2015-03-24 HISTORY — DX: Acute myocardial infarction, unspecified: I21.9

## 2015-03-24 SURGERY — LITHOTRIPSY, ESWL
Anesthesia: Moderate Sedation | Laterality: Left

## 2015-03-24 MED ORDER — DIPHENHYDRAMINE HCL 25 MG PO CAPS
ORAL_CAPSULE | ORAL | Status: AC
  Administered 2015-03-24: 25 mg via ORAL
  Filled 2015-03-24: qty 1

## 2015-03-24 MED ORDER — PROMETHAZINE HCL 25 MG/ML IJ SOLN
INTRAMUSCULAR | Status: AC
  Administered 2015-03-24: 25 mg
  Filled 2015-03-24: qty 1

## 2015-03-24 MED ORDER — MIDAZOLAM HCL 2 MG/2ML IJ SOLN
1.0000 mg | Freq: Once | INTRAMUSCULAR | Status: AC
  Administered 2015-03-24: 1 mg via INTRAMUSCULAR

## 2015-03-24 MED ORDER — DEXTROSE 5 % IV SOLN
1.0000 g | Freq: Once | INTRAVENOUS | Status: AC
  Administered 2015-03-24: 1 g via INTRAVENOUS
  Filled 2015-03-24: qty 10

## 2015-03-24 MED ORDER — DEXTROSE-NACL 5-0.45 % IV SOLN
INTRAVENOUS | Status: DC
  Administered 2015-03-24: 12:00:00 via INTRAVENOUS

## 2015-03-24 MED ORDER — LEVOFLOXACIN 500 MG PO TABS: 500.0000 mg | ORAL_TABLET | Freq: Every day | ORAL | Status: DC

## 2015-03-24 MED ORDER — MORPHINE SULFATE (PF) 10 MG/ML IV SOLN
INTRAVENOUS | Status: AC
  Administered 2015-03-24: 10 mg via INTRAMUSCULAR
  Filled 2015-03-24: qty 1

## 2015-03-24 MED ORDER — FUROSEMIDE 10 MG/ML IJ SOLN: 10.0000 mg | Freq: Once | INTRAMUSCULAR | Status: DC

## 2015-03-24 MED ORDER — SODIUM CHLORIDE 0.9 % IJ SOLN
INTRAMUSCULAR | Status: AC
  Filled 2015-03-24: qty 3

## 2015-03-24 MED ORDER — MORPHINE SULFATE (PF) 10 MG/ML IV SOLN
10.0000 mg | Freq: Once | INTRAVENOUS | Status: AC
  Administered 2015-03-24: 10 mg via INTRAMUSCULAR

## 2015-03-24 MED ORDER — MIDAZOLAM HCL 2 MG/2ML IJ SOLN
INTRAMUSCULAR | Status: AC
  Administered 2015-03-24: 1 mg via INTRAMUSCULAR
  Filled 2015-03-24: qty 2

## 2015-03-24 MED ORDER — PROMETHAZINE HCL 25 MG/ML IJ SOLN: 12.5000 mg | Freq: Once | INTRAMUSCULAR | Status: DC

## 2015-03-24 MED ORDER — DIPHENHYDRAMINE HCL 25 MG PO CAPS
25.0000 mg | ORAL_CAPSULE | ORAL | Status: AC
  Administered 2015-03-24: 25 mg via ORAL

## 2015-03-24 NOTE — OR Nursing (Signed)
Report given to Mickel Baas on [place] truck.

## 2015-03-24 NOTE — Discharge Instructions (Addendum)
Dietary Guidelines to Help Prevent Kidney Stones Your risk of kidney stones can be decreased by adjusting the foods you eat. The most important thing you can do is drink enough fluid. You should drink enough fluid to keep your urine clear or pale yellow. The following guidelines provide specific information for the type of kidney stone you have had. GUIDELINES ACCORDING TO TYPE OF KIDNEY STONE Calcium Oxalate Kidney Stones  Reduce the amount of salt you eat. Foods that have a lot of salt cause your body to release excess calcium into your urine. The excess calcium can combine with a substance called oxalate to form kidney stones.  Reduce the amount of animal protein you eat if the amount you eat is excessive. Animal protein causes your body to release excess calcium into your urine. Ask your dietitian how much protein from animal sources you should be eating.  Avoid foods that are high in oxalates. If you take vitamins, they should have less than 500 mg of vitamin C. Your body turns vitamin C into oxalates. You do not need to avoid fruits and vegetables high in vitamin C. Calcium Phosphate Kidney Stones  Reduce the amount of salt you eat to help prevent the release of excess calcium into your urine.  Reduce the amount of animal protein you eat if the amount you eat is excessive. Animal protein causes your body to release excess calcium into your urine. Ask your dietitian how much protein from animal sources you should be eating.  Get enough calcium from food or take a calcium supplement (ask your dietitian for recommendations). Food sources of calcium that do not increase your risk of kidney stones include:  Broccoli.  Dairy products, such as cheese and yogurt.  Pudding. Uric Acid Kidney Stones  Do not have more than 6 oz of animal protein per day. FOOD SOURCES Animal Protein Sources  Meat (all types).  Poultry.  Eggs.  Fish, seafood. Foods High in [employer] seasonings.  Soy  sauce.  Teriyaki sauce.  Cured and processed meats.  Salted crackers and snack foods.  Fast food.  Canned soups and most canned foods. Foods High in Oxalates  Grains:  Amaranth.  Barley.  Grits.  Wheat germ.  Bran.  Buckwheat flour.  All bran cereals.  Pretzels.  Whole wheat bread.  Vegetables:  Beans (wax).  Beets and beet greens.  Collard greens.  Eggplant.  [place]ole.  Leeks.  Okra.  Parsley.  Rutabagas.  Spinach.  Swiss chard.  Tomato paste.  Fried potatoes.  Sweet potatoes.  Fruits:  Red currants.  Figs.  Kiwi.  Rhubarb.  Meat and Other Protein Sources:  Beans (dried).  Soy burgers and other soybean products.  Miso.  Nuts (peanuts, almonds, pecans, cashews, hazelnuts).  Nut butters.  Sesame seeds and tahini (paste made of sesame seeds).  Poppy seeds.  Beverages:  Chocolate drink mixes.  Soy milk.  Instant iced tea.  Juices made from high-oxalate fruits or vegetables.  Other:  Carob.  Chocolate.  Fruitcake.  Marmalades.   This information is not intended to replace advice given to you by your health care provider. Make sure you discuss any questions you have with your health care provider.   Document Released: 06/16/2010 Document Revised: 02/24/2013 Document Reviewed: 01/16/2013 Elsevier Interactive Patient Education 2016 Else[employer]  Kidney Stones Kidney stones (urolithiasis) are deposits that form inside your kidneys. The intense pain is caused by the stone moving through the urinary tract. When the stone moves, the ureter  goes into spasm around the stone. The stone is usually passed in the urine.  CAUSES   A disorder that makes certain neck glands produce too much parathyroid hormone (primary hyperparathyroidism).  A buildup of uric acid crystals, similar to gout in your joints.  Narrowing (stricture) of the ureter.  A kidney obstruction present at birth (congenital  obstruction).  Previous surgery on the kidney or ureters.  Numerous kidney infections. SYMPTOMS   Feeling sick to your stomach (nauseous).  Throwing up (vomiting).  Blood in the urine (hematuria).  Pain that usually spreads (radiates) to the groin.  Frequency or urgency of urination. DIAGNOSIS   Taking a history and physical exam.  Blood or urine tests.  CT scan.  Occasionally, an examination of the inside of the urinary bladder (cystoscopy) is performed. TREATMENT   Observation.  Increasing your fluid intake.  Extracorporeal shock wave lithotripsy--This is a noninvasive procedure that uses shock waves to break up kidney stones.  Surgery may be needed if you have severe pain or persistent obstruction. There are various surgical procedures. Most of the procedures are performed with the use of small instruments. Only small incisions are needed to accommodate these instruments, so recovery time is minimized. The size, location, and chemical composition are all important variables that will determine the proper choice of action for you. Talk to your health care provider to better understand your situation so that you will minimize the risk of injury to yourself and your kidney.  HOME CARE INSTRUCTIONS   Drink enough water and fluids to keep your urine clear or pale yellow. This will help you to pass the stone or stone fragments.  Strain all urine through the provided strainer. Keep all particulate matter and stones for your health care provider to see. The stone causing the pain may be as small as a grain of salt. It is very important to use the strainer each and every time you pass your urine. The collection of your stone will allow your health care provider to analyze it and verify that a stone has actually passed. The stone analysis will often identify what you can do to reduce the incidence of recurrences.  Only take over-the-counter or prescription medicines for pain,  discomfort, or fever as directed by your health care provider.  Keep all follow-up visits as told by your health care provider. This is important.  Get follow-up X-rays if required. The absence of pain does not always mean that the stone has passed. It may have only stopped moving. If the urine remains completely obstructed, it can cause loss of kidney function or even complete destruction of the kidney. It is your responsibility to make sure X-rays and follow-ups are completed. Ultrasounds of the kidney can show blockages and the status of the kidney. Ultrasounds are not associated with any radiation and can be performed easily in a matter of minutes.  Make changes to your daily diet as told by your health care provider. You may be told to:  Limit the amount of salt that you eat.  Eat 5 or more servings of fruits and vegetables each day.  Limit the amount of meat, poultry, fish, and eggs that you eat.  Collect a 24-hour urine sample as told by your health care provider.You may need to collect another urine sample every 6-12 months. SEEK MEDICAL CARE IF:  You experience pain that is progressive and unresponsive to any pain medicine you have been prescribed. SEEK IMMEDIATE MEDICAL CARE IF:   Pain  cannot be controlled with the prescribed medicine.  You have a fever or shaking chills.  The severity or intensity of pain increases over 18 hours and is not relieved by pain medicine.  You develop a new onset of abdominal pain.  You feel faint or pass out.  You are unable to urinate.   This information is not intended to replace advice given to you by your health care provider. Make sure you discuss any questions you have with your health care provider.   Document Released: 02/19/2005 Document Revised: 11/10/2014 Document Reviewed: 07/23/2012 Elsevier Interactive Patient Education 2016 [place], Care After Refer to this sheet in the next few weeks. These instructions  provide you with information on caring for yourself after your procedure. Your health care provider may also give you more specific instructions. Your treatment has been planned according to current medical practices, but problems sometimes occur. Call your health care provider if you have any problems or questions after your procedure. WHAT TO EXPECT AFTER THE PROCEDURE   Your urine may have a red tinge for a few days after treatment. Blood loss is usually minimal.  You may have soreness in the back or flank area. This usually goes away after a few days. The procedure can cause blotches or bruises on the back where the pressure wave enters the skin. These marks usually cause only minimal discomfort and should disappear in a short time.  Stone fragments should begin to pass within 24 hours of treatment. However, a delayed passage is not unusual.  You may have pain, discomfort, and feel sick to your stomach (nauseated) when the crushed fragments of stone are passed down the tube from the kidney to the bladder. Stone fragments can pass soon after the procedure and may last for up to 4-8 weeks.  A small number of patients may have severe pain when stone fragments are not able to pass, which leads to an obstruction.  If your stone is greater than 1 inch (2.5 cm) in diameter or if you have multiple stones that have a combined diameter greater than 1 inch (2.5 cm), you may require more than one treatment.  If you had a stent placed prior to your procedure, you may experience some discomfort, especially during urination. You may experience the pain or discomfort in your flank or back, or you may experience a sharp pain or discomfort at the base of your penis or in your lower abdomen. The discomfort usually lasts only a few minutes after urinating. HOME CARE INSTRUCTIONS   Rest at home until you feel your energy improving.  Only take over-the-counter or prescription medicines for pain, discomfort, or  fever as directed by your health care provider. Depending on the type of lithotripsy, you may need to take antibiotics and anti-inflammatory medicines for a few days.  Drink enough water and fluids to keep your urine clear or pale yellow. This helps "flush" your kidneys. It helps pass any remaining pieces of stone and prevents stones from coming back.  Most people can resume daily activities within 1-2 days after standard lithotripsy. It can take longer to recover from laser and percutaneous lithotripsy.  Strain all urine through the provided strainer. Keep all particulate matter and stones for your health care provider to see. The stone may be as small as a grain of salt. It is very important to use the strainer each and every time you pass your urine. Any stones that are found can be sent to  a medical lab for examination.  Visit your health care provider for a follow-up appointment in a few weeks. Your doctor may remove your stent if you have one. Your health care provider will also check to see whether stone particles still remain. SEEK MEDICAL CARE IF:   Your pain is not relieved by medicine.  You have a lasting nauseous feeling.  You feel there is too much blood in the urine.  You develop persistent problems with frequent or painful urination that does not at least partially improve after 2 days following the procedure.  You have a congested cough.  You feel lightheaded.  You develop a rash or any other signs that might suggest an allergic problem.  You develop any reaction or side effects to your medicine(s). SEEK IMMEDIATE MEDICAL CARE IF:   You experience severe back or flank pain or both.  You see nothing but blood when you urinate.  You cannot pass any urine at all.  You have a fever or shaking chills.  You develop shortness of breath, difficulty breathing, or chest pain.  You develop vomiting that will not stop after 6-8 hours.  You have a fainting episode.   This  information is not intended to replace advice given to you by your health care provider. Make sure you discuss any questions you have with your health care provider.   Document Released: 03/11/2007 Document Revised: 11/10/2014 Document Reviewed: 09/04/2012 Elsevier Interactive Patient Education [employer].  Lithotripsy Lithotripsy is a treatment that can sometimes help eliminate kidney stones and pain that they cause. A form of lithotripsy, also known as extracorporeal shock wave lithotripsy, is a nonsurgical procedure that helps your body rid itself of the kidney stone when it is too big to pass on its own. Extracorporeal shock wave lithotripsy is a method of crushing a kidney stone with shock waves. These shock waves pass through your body and are focused on your stone. They cause the kidney stones to crumble while still in the urinary tract. It is then easier for the smaller pieces of stone to pass in the urine. Lithotripsy usually takes about an hour. It is done in a hospital, a lithotripsy center, or a mobile unit. It usually does not require an overnight stay. Your health care provider will instruct you on preparation for the procedure. Your health care provider will tell you what to expect afterward. LET [facility] CARE PROVIDER KNOW ABOUT:  Any allergies you have.  All medicines you are taking, including vitamins, herbs, eye drops, creams, and over-the-counter medicines.  Previous problems you or members of your family have had with the use of anesthetics.  Any blood disorders you have.  Previous surgeries you have had.  Medical conditions you have. RISKS AND COMPLICATIONS Generally, lithotripsy for kidney stones is a safe procedure. However, as with any procedure, complications can occur. Possible complications include:  Infection.  Bleeding of the kidney.  Bruising of the kidney or skin.  Obstruction of the ureter.  Failure of the stone to fragment. BEFORE THE  PROCEDURE  Do not eat or drink for 6-8 hours prior to the procedure. You may, however, take the medications with a sip of water that your physician instructs you to take  Do not take aspirin or aspirin-containing products for 7 days prior to your procedure  Do not take nonsteroidal anti-inflammatory products for 7 days prior to your procedure PROCEDURE A stent (flexible tube with holes) may be placed in your ureter. The ureter is  the tube that transports the urine from the kidneys to the bladder. Your health care provider may place a stent before the procedure. This will help keep urine flowing from the kidney if the fragments of the stone block the ureter. You may have an IV tube placed in one of your veins to give you fluids and medicines. These medicines may help you relax or make you sleep. During the procedure, you will lie comfortably on a fluid-filled cushion or in a warm-water bath. After an X-ray or ultrasound exam to locate your stone, shock waves are aimed at the stone. If you are awake, you may feel a tapping sensation as the shock waves pass through your body. If large stone particles remain after treatment, a second procedure may be necessary at a later date. For comfort during the test:  Relax as much as possible.  Try to remain still as much as possible.  Try to follow instructions to speed up the test.  Let your health care provider know if you are uncomfortable, anxious, or in pain. AFTER THE PROCEDURE  After surgery, you will be taken to the recovery area. A nurse will watch and check your progress. Once you're awake, stable, and taking fluids well, you will be allowed to go home as long as there are no problems. You will also be allowed to pass your urine before discharge.You may be given antibiotics to help prevent infection. You may also be prescribed pain medicine if needed. In a week or two, your health care provider may remove your stent, if you have one. You may first  have an X-ray exam to check on how successful the fragmentation of your stone has been and how much of the stone has passed. Your health care provider will check to see whether or not stone particles remain. SEEK IMMEDIATE MEDICAL CARE IF:  You develop a fever or shaking chills.  Your pain is not relieved by medicine.  You feel sick to your stomach (nauseated) and you vomit.  You develop heavy bleeding.  You have difficulty urinating.  You start to pass your stent from your penis.   This information is not intended to replace advice given to you by your health care provider. Make sure you discuss any questions you have with your health care provider.   Document Released: 02/17/2000 Document Revised: 03/12/2014 Document Reviewed: 09/04/2012 Elsevier Interactive Patient Education 2016 [place].  Renal Colic Renal colic is pain that is caused by passing a kidney stone. The pain can be sharp and severe. It may be felt in the back, abdomen, side (flank), or groin. It can cause nausea. Renal colic can come and go. HOME CARE INSTRUCTIONS Watch your condition for any changes. The following actions may help to lessen any discomfort that you are feeling:  Take medicines only as directed by your health care provider.  Ask your health care provider if it is okay to take over-the-counter pain medicine.  Drink enough fluid to keep your urine clear or pale yellow. Drink 6-8 glasses of water each day.  Limit the amount of salt that you eat to less than 2 grams per day.  Reduce the amount of protein in your diet. Eat less meat, fish, nuts, and dairy.  Avoid foods such as spinach, rhubarb, nuts, or bran. These may make kidney stones more likely to form. SEEK MEDICAL CARE IF:  You have a fever or chills.  Your urine smells bad or looks cloudy.  You have pain or  burning when you pass urine. SEEK IMMEDIATE MEDICAL CARE IF:  Your flank pain or groin pain suddenly worsens.  You become  confused or disoriented or you lose consciousness.   This information is not intended to replace advice given to you by your health care provider. Make sure you discuss any questions you have with your health care provider.   Document Released: 11/29/2004 Document Revised: 03/12/2014 Document Reviewed: 12/30/2013 Elsevier Interactive Patient Education [employer].

## 2015-03-24 NOTE — OR Nursing (Signed)
Patient has a bandage on upper right side of back.  Patient states he hit it on something and its draining.  He has been to his doctor.

## 2015-03-28 ENCOUNTER — Telehealth: Payer: Self-pay | Admitting: Family Medicine

## 2015-03-28 NOTE — Telephone Encounter (Signed)
Patient is due for pneumovax 23 and we do have the vaccine available.

## 2015-03-28 NOTE — Telephone Encounter (Signed)
FYI: Pt is scheduled to come in on 03/31/15 at 10 am for a pneumonia shot. Pt stated he got a call telling him he was due for the shot. (not sure if it was a call from his insurance or if it was a hospital call) Is pt due for the shot and do we have the shot in the office? Please advise if I need to change the appt. Thanks TNP

## 2015-03-31 ENCOUNTER — Ambulatory Visit (INDEPENDENT_AMBULATORY_CARE_PROVIDER_SITE_OTHER): Payer: Medicare Other | Admitting: Family Medicine

## 2015-03-31 DIAGNOSIS — Z23 Encounter for immunization: Secondary | ICD-10-CM | POA: Diagnosis not present

## 2015-03-31 DIAGNOSIS — N200 Calculus of kidney: Secondary | ICD-10-CM | POA: Diagnosis not present

## 2015-04-07 ENCOUNTER — Encounter: Payer: Self-pay | Admitting: Family Medicine

## 2015-04-07 NOTE — Progress Notes (Signed)
Subjective:    Patient ID: Luke Rojas, male    DOB: Sep 12, 1945, 70 y.o.   MRN: [id number] No chief complaint on file.   HPI This 70 year old male presented for Pneumovax-23 vaccination. No history of recent illness or fever.  Patient Active Problem List   Diagnosis Date Noted  . Arthritis 10/08/2014  . H/O acute myocardial infarction 10/08/2014  . Acid reflux 10/08/2014  . HLD (hyperlipidemia) 10/08/2014  . BP (high blood pressure) 10/08/2014  . 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 Benign neoplasm of skin 07/19/2006  . Essential (primary) hypertension 11/15/2005  . Decreased potassium in the blood 11/15/2005   Past Surgical History  Procedure Laterality Date  . Extracorporeal shock wave lithotripsy Left 11/04/2014    has had 2 previous lithotripsies  . Cardiac catheterization  2013    2 stents  . Extracorporeal shock wave lithotripsy Left 03/24/2015    Procedure: EXTRACORPOREAL SHOCK WAVE LITHOTRIPSY (ESWL);  Surgeon: Royston Cowper, MD;  Location: [facility] ORS;  Service: Urology;  Laterality: Left;   Allergies  Allergen Reactions  . Codeine Nausea Only   Current Outpatient Prescriptions on File Prior to Visit  Medication Sig Dispense Refill  . atorvastatin (LIPITOR) 40 MG tablet Take 1 tablet by mouth daily.    . clopidogrel (PLAVIX) 75 MG tablet     . Diphenhyd-Hydrocort-Nystatin (FIRST-DUKES MOUTHWASH) SUSP Gargle and swallow one teaspoon after meals and at bedtime. (Patient not taking: Reported on 03/24/2015) 237 mL 0  . docusate sodium (COLACE) 100 MG capsule Take 2 capsules (200 mg total) by mouth 2 (two) times daily. 120 capsule 3  . levofloxacin (LEVAQUIN) 500 MG tablet Take 1 tablet (500 mg total) by mouth daily. 2 tablet 0  . metoprolol (LOPRESSOR) 50 MG tablet Take 1 tablet by mouth. Morning and night    . MULTIPLE VITAMIN PO Take 1 tablet by mouth daily.    . nitroGLYCERIN (NITROSTAT) 0.4 MG SL tablet Place 1 tablet under the  tongue. Reported on 03/24/2015    . NUCYNTA 50 MG TABS tablet Take 1 tablet (50 mg total) by mouth every 6 (six) hours as needed for moderate pain. 1 TO 2 TABS Q 6 HOURS PRN PAIN (Patient not taking: Reported on 03/24/2015) 30 tablet 0  . Omega-3 Fatty Acids (FISH OIL) 1200 MG CAPS Take 1 capsule by mouth 2 (two) times daily.    Marland Kitchen omeprazole (PRILOSEC) 20 MG capsule     . ondansetron (ZOFRAN ODT) 8 MG disintegrating tablet Take 1 tablet (8 mg total) by mouth every 6 (six) hours as needed for nausea or vomiting. (Patient not taking: Reported on 03/24/2015) 10 tablet 3  . Potassium Citrate 15 MEQ (1620 MG) TBCR     . promethazine (PHENERGAN) 25 MG suppository Place 1 suppository (25 mg total) rectally every 6 (six) hours as needed for nausea or vomiting. (Patient not taking: Reported on 03/24/2015) 10 suppository 3  . ramipril (ALTACE) 10 MG capsule Take 1 capsule by mouth daily.    . tamsulosin (FLOMAX) 0.4 MG CAPS capsule Take 1 capsule (0.4 mg total) by mouth daily. 30 capsule 11   No current facility-administered medications on file prior to visit.    Review of Systems  Constitutional: Negative.   HENT: Negative.   Respiratory: Negative.   Gastrointestinal: Negative.   Genitourinary: Negative.         Objective:   Physical Exam  Constitutional: He is oriented to person, place, and time. He  appears well-developed and well-nourished. No distress.  HENT:  Head: Normocephalic and atraumatic.  Right Ear: Hearing normal.  Left Ear: Hearing normal.  Nose: Nose normal.  Eyes: Conjunctivae and lids are normal. Right eye exhibits no discharge. Left eye exhibits no discharge. No scleral icterus.  Pulmonary/Chest: Effort normal. No respiratory distress.  Musculoskeletal: Normal range of motion.  Neurological: He is alert and oriented to person, place, and time.  Skin: Skin is intact. No lesion and no rash noted.  Psychiatric: He has a normal mood and affect. His speech is normal and behavior is  normal. Thought content normal.       Assessment & Plan:  1. Need for pneumococcal vaccination Given Pneumovax-23. Was given Prenar-15 January 2014. Recheck prn. - Pneumococcal polysaccharide vaccine 23-valent greater than or equal to 2yo subcutaneous/IM

## 2015-04-14 DIAGNOSIS — N200 Calculus of kidney: Secondary | ICD-10-CM | POA: Diagnosis not present

## 2015-05-03 ENCOUNTER — Encounter: Payer: Self-pay | Admitting: General Surgery

## 2015-05-03 ENCOUNTER — Ambulatory Visit (INDEPENDENT_AMBULATORY_CARE_PROVIDER_SITE_OTHER): Payer: Medicare Other | Admitting: General Surgery

## 2015-05-03 VITALS — BP 138/78 | HR 62 | Resp 12 | Ht 68.0 in | Wt 190.0 lb

## 2015-05-03 DIAGNOSIS — Z1211 Encounter for screening for malignant neoplasm of colon: Secondary | ICD-10-CM | POA: Diagnosis not present

## 2015-05-03 NOTE — Patient Instructions (Signed)

## 2015-05-03 NOTE — Progress Notes (Signed)
Patient ID: Luke Rojas, male   DOB: Nov 01, 1945, 70 y.o.   MRN: [id number]  Chief Complaint  Patient presents with  . Colonoscopy    HPI Luke Rojas is a 70 y.o. male here today for a evaluation of a screening colonoscopy. Last colonoscopy 04/27/05. Patient states no GI problems at this time. Bowels move daily and no bleeding.  I personally reviewed the patient's history.  HPI  Past Medical History  Diagnosis Date  . Hypertension   . Chronic kidney disease     kidney stones  . GERD (gastroesophageal reflux disease)   . Hypercholesteremia   . Coronary artery disease     2 stents   . Myocardial infarction [facility]) 2013    Past Surgical History  Procedure Laterality Date  . Extracorporeal shock wave lithotripsy Left 11/04/2014    has had 2 previous lithotripsies  . Cardiac catheterization  2013    2 stents  . Extracorporeal shock wave lithotripsy Left 03/24/2015    Procedure: EXTRACORPOREAL SHOCK WAVE LITHOTRIPSY (ESWL);  Surgeon: Royston Cowper, MD;  Location: [facility] ORS;  Service: Urology;  Laterality: Left;  . Colonoscopy  04/27/05    Family History  Problem Relation Age of Onset  . Pulmonary embolism Mother   . Transient ischemic attack Mother   . Pancreatic cancer Father   . Hypertension Father   . Cirrhosis Brother     Social History Social History  Substance Use Topics  . Smoking status: Former Smoker -- 1.00 packs/day for 30 years    Types: Cigarettes    Quit date: 03/05/1984  . Smokeless tobacco: Current User    Types: Chew  . Alcohol Use: No    Allergies  Allergen Reactions  . Codeine Nausea Only    Current Outpatient Prescriptions  Medication Sig Dispense Refill  . aspirin 81 MG tablet Take 81 mg by mouth daily.    Marland Kitchen atorvastatin (LIPITOR) 40 MG tablet Take 1 tablet by mouth daily.    . clopidogrel (PLAVIX) 75 MG tablet     . metoprolol (LOPRESSOR) 50 MG tablet Take 1 tablet by mouth. Morning and night    . MULTIPLE VITAMIN PO Take 1  tablet by mouth daily.    . naproxen (NAPROSYN) 500 MG tablet Take 500 mg by mouth 2 (two) times daily with a meal.    . nitroGLYCERIN (NITROSTAT) 0.4 MG SL tablet Place 1 tablet under the tongue. Reported on 03/24/2015    . Omega-3 Fatty Acids (FISH OIL) 1200 MG CAPS Take 1 capsule by mouth 2 (two) times daily.    Marland Kitchen omeprazole (PRILOSEC) 20 MG capsule     . ramipril (ALTACE) 10 MG capsule Take 1 capsule by mouth daily.    . polyethylene glycol powder (GLYCOLAX/MIRALAX) powder 255 grams one bottle for colonoscopy prep 255 g 0   No current facility-administered medications for this visit.    Review of Systems Review of Systems  Constitutional: Negative.   Respiratory: Negative.   Cardiovascular: Negative.   Gastrointestinal: Negative.     Blood pressure 138/78, pulse 62, resp. rate 12, height '5\' 8"'$  (1.727 m), weight 190 lb (86.183 kg).  Physical Exam Physical Exam  Constitutional: He is oriented to person, place, and time. He appears well-developed and well-nourished.  HENT:  Mouth/Throat: Oropharynx is clear and moist.  Eyes: Conjunctivae are normal. No scleral icterus.  Neck: Neck supple.  Cardiovascular: Normal rate, regular rhythm and normal heart sounds.   Pulmonary/Chest: Effort normal and breath sounds normal.  Lymphadenopathy:    He has no cervical adenopathy.  Neurological: He is alert and oriented to person, place, and time.  Skin: Skin is warm and dry.  Psychiatric: His behavior is normal.    Data Reviewed 04/27/2005 colonoscopy was reviewed. Sessile polyp in the rectum identified. Pathology showed hyperplastic polyp.  Assessment    Candidate for screening colonoscopy.    Plan    It has been 4 years since the last stent placement. The patient should be able to safely stop his Plavix prior to his colonoscopy.    Colonoscopy with possible biopsy/polypectomy prn: Information regarding the procedure, including its potential risks and complications (including but  not limited to perforation of the bowel, which may require emergency surgery to repair, and bleeding) was verbally given to the patient. Educational information regarding lower intestinal endoscopy was given to the patient. Written instructions for how to complete the bowel prep using Miralax were provided. The importance of drinking ample fluids to avoid dehydration as a result of the prep emphasized.  Stop plavix 5 days prior to colonoscopy.  PCP:  Margo Common This information has been scribed by Karie Fetch [place].    Luke Rojas 05/04/2015, 7:56 PM

## 2015-05-04 ENCOUNTER — Other Ambulatory Visit: Payer: Self-pay | Admitting: *Deleted

## 2015-05-04 ENCOUNTER — Other Ambulatory Visit: Payer: Self-pay | Admitting: General Surgery

## 2015-05-04 ENCOUNTER — Encounter: Payer: Self-pay | Admitting: *Deleted

## 2015-05-04 DIAGNOSIS — Z1211 Encounter for screening for malignant neoplasm of colon: Secondary | ICD-10-CM

## 2015-05-04 MED ORDER — POLYETHYLENE GLYCOL 3350 17 GM/SCOOP PO POWD: ORAL | Status: DC

## 2015-05-04 NOTE — Progress Notes (Signed)
Patient ID: Luke Rojas, male   DOB: 1945-08-31, 70 y.o.   MRN: [id number]  Patient has been scheduled for a colonoscopy on 06-08-15 at [facility].   This patient has been asked to discontinue Plavix 5 days prior to procedure. It is okay for patient to continue 81 mg aspirin once daily.

## 2015-06-01 ENCOUNTER — Telehealth: Payer: Self-pay | Admitting: *Deleted

## 2015-06-01 NOTE — Telephone Encounter (Signed)
Patient contacted today and he confirms no medication changes since last office visit. This patient was reminded about stoping fish oil and Plavix. It is okay for patient to continue 81 mg aspirin once daily.  He reports he has already picked up Miralax prescription.   We will proceed with colonoscopy as scheduled for 06-08-15 at [facility].   This patient was instructed to call the office should he have further questions.

## 2015-06-02 ENCOUNTER — Other Ambulatory Visit: Payer: Self-pay | Admitting: General Surgery

## 2015-06-02 DIAGNOSIS — Z1211 Encounter for screening for malignant neoplasm of colon: Secondary | ICD-10-CM

## 2015-06-07 ENCOUNTER — Encounter: Payer: Self-pay | Admitting: *Deleted

## 2015-06-08 ENCOUNTER — Ambulatory Visit: Payer: Medicare Other | Admitting: Anesthesiology

## 2015-06-08 ENCOUNTER — Encounter
Admission: RE | Disposition: A | Discharge status: Home / Self Care | Payer: Self-pay | Source: Ambulatory Visit | Attending: General Surgery

## 2015-06-08 ENCOUNTER — Ambulatory Visit
Admission: RE | Admit: 2015-06-08 | Discharge: 2015-06-08 | Disposition: A | Discharge status: Home / Self Care | Payer: Medicare Other | Source: Ambulatory Visit | Attending: General Surgery | Admitting: General Surgery

## 2015-06-08 ENCOUNTER — Encounter: Payer: Self-pay | Admitting: *Deleted

## 2015-06-08 DIAGNOSIS — Z9889 Other specified postprocedural states: Secondary | ICD-10-CM | POA: Diagnosis not present

## 2015-06-08 DIAGNOSIS — Z7982 Long term (current) use of aspirin: Secondary | ICD-10-CM | POA: Insufficient documentation

## 2015-06-08 DIAGNOSIS — Z791 Long term (current) use of non-steroidal anti-inflammatories (NSAID): Secondary | ICD-10-CM | POA: Insufficient documentation

## 2015-06-08 DIAGNOSIS — E7800 Pure hypercholesterolemia, unspecified: Secondary | ICD-10-CM | POA: Insufficient documentation

## 2015-06-08 DIAGNOSIS — Z79899 Other long term (current) drug therapy: Secondary | ICD-10-CM | POA: Diagnosis not present

## 2015-06-08 DIAGNOSIS — I129 Hypertensive chronic kidney disease with stage 1 through stage 4 chronic kidney disease, or unspecified chronic kidney disease: Secondary | ICD-10-CM | POA: Insufficient documentation

## 2015-06-08 DIAGNOSIS — Z7902 Long term (current) use of antithrombotics/antiplatelets: Secondary | ICD-10-CM | POA: Diagnosis not present

## 2015-06-08 DIAGNOSIS — K219 Gastro-esophageal reflux disease without esophagitis: Secondary | ICD-10-CM | POA: Diagnosis not present

## 2015-06-08 DIAGNOSIS — N189 Chronic kidney disease, unspecified: Secondary | ICD-10-CM | POA: Diagnosis not present

## 2015-06-08 DIAGNOSIS — I2510 Atherosclerotic heart disease of native coronary artery without angina pectoris: Secondary | ICD-10-CM | POA: Insufficient documentation

## 2015-06-08 DIAGNOSIS — I252 Old myocardial infarction: Secondary | ICD-10-CM | POA: Insufficient documentation

## 2015-06-08 DIAGNOSIS — Z1211 Encounter for screening for malignant neoplasm of colon: Secondary | ICD-10-CM | POA: Diagnosis not present

## 2015-06-08 DIAGNOSIS — Z87442 Personal history of urinary calculi: Secondary | ICD-10-CM | POA: Diagnosis not present

## 2015-06-08 DIAGNOSIS — Z87891 Personal history of nicotine dependence: Secondary | ICD-10-CM | POA: Diagnosis not present

## 2015-06-08 HISTORY — PX: COLONOSCOPY WITH PROPOFOL: SHX5780

## 2015-06-08 SURGERY — COLONOSCOPY WITH PROPOFOL
Anesthesia: General

## 2015-06-08 MED ORDER — LIDOCAINE HCL (CARDIAC) 20 MG/ML IV SOLN
INTRAVENOUS | Status: DC | PRN
  Administered 2015-06-08: 30 mg via INTRAVENOUS

## 2015-06-08 MED ORDER — MIDAZOLAM HCL 5 MG/5ML IJ SOLN
INTRAMUSCULAR | Status: DC | PRN
  Administered 2015-06-08: 1 mg via INTRAVENOUS

## 2015-06-08 MED ORDER — PROPOFOL 10 MG/ML IV BOLUS
INTRAVENOUS | Status: DC | PRN
  Administered 2015-06-08: 50 mg via INTRAVENOUS

## 2015-06-08 MED ORDER — SODIUM CHLORIDE 0.9 % IV SOLN
INTRAVENOUS | Status: DC
  Administered 2015-06-08: 11:00:00 via INTRAVENOUS
  Administered 2015-06-08: 1000 mL via INTRAVENOUS

## 2015-06-08 MED ORDER — PROPOFOL 500 MG/50ML IV EMUL
INTRAVENOUS | Status: DC | PRN
  Administered 2015-06-08: 120 ug/kg/min via INTRAVENOUS

## 2015-06-08 MED ORDER — FENTANYL CITRATE (PF) 100 MCG/2ML IJ SOLN
INTRAMUSCULAR | Status: DC | PRN
  Administered 2015-06-08: 50 ug via INTRAVENOUS

## 2015-06-08 NOTE — Op Note (Signed)
[facility] Gastroenterology Patient Name: Luke Rojas Procedure Date: 06/08/2015 10:43 AM MRN: [id number] Account #: [id number] Date of Birth: [birth year] Admit Type: Outpatient Age: 70 Room: [facility] ENDO ROOM 1 Gender: Male Note Status: Finalized Procedure:            Colonoscopy Indications:          Screening for colorectal malignant neoplasm Providers:            Robert Bellow, MD Referring MD:         Guadalupe Maple, MD (Referring MD) Medicines:            Monitored Anesthesia Care Complications:        No immediate complications. Procedure:            Pre-Anesthesia Assessment:                       - Prior to the procedure, a History and Physical was                        performed, and patient medications, allergies and                        sensitivities were reviewed. The patient's tolerance of                        previous anesthesia was reviewed.                       - The risks and benefits of the procedure and the                        sedation options and risks were discussed with the                        patient. All questions were answered and informed                        consent was obtained.                       After obtaining informed consent, the colonoscope was                        passed under direct vision. Throughout the procedure,                        the patient's blood pressure, pulse, and oxygen                        saturations were monitored continuously. The                        Colonoscope was introduced through the anus and                        advanced to the the cecum, identified by appendiceal                        orifice and ileocecal valve. The colonoscopy was  performed without difficulty. The patient tolerated the                        procedure well. The quality of the bowel preparation                        was excellent. Findings:      The entire examined colon  appeared normal on direct and retroflexion       views. Impression:           - The entire examined colon is normal on direct and                        retroflexion views.                       - No specimens collected. Recommendation:       - Repeat colonoscopy in 10 years for screening purposes. Procedure Code(s):    --- Professional ---                       [phone], Colonoscopy, flexible; diagnostic, including                        collection of specimen(s) by brushing or washing, when                        performed (separate procedure) Diagnosis Code(s):    --- Professional ---                       Z12.11, Encounter for screening for malignant neoplasm                        of colon CPT copyright 2016 American Medical Association. All rights reserved. The codes documented in this report are preliminary and upon coder review may  be revised to meet current compliance requirements. Robert Bellow, MD 06/08/2015 11:08:24 AM This report has been signed electronically. Number of Addenda: 0 Note Initiated On: 06/08/2015 10:43 AM Scope Withdrawal Time: 0 hours 5 minutes 7 seconds  Total Procedure Duration: 0 hours 11 minutes 40 seconds       [facility]

## 2015-06-08 NOTE — Transfer of Care (Signed)
Immediate Anesthesia Transfer of Care Note  Patient: Luke Rojas  Procedure(s) Performed: Procedure(s): COLONOSCOPY WITH PROPOFOL (N/A)  Patient Location: PACU and Short Stay  Anesthesia Type:General  Level of Consciousness: patient cooperative  Airway & Oxygen Therapy: Patient Spontanous Breathing and Patient connected to nasal cannula oxygen  Post-op Assessment: Report given to RN and Post -op Vital signs reviewed and stable  Post vital signs: Reviewed and stable  Last Vitals:  Filed Vitals:   06/08/15 0947  BP: 132/75  Pulse: 59  Temp: 36.6 C  Resp: 18    Complications: No apparent anesthesia complications

## 2015-06-08 NOTE — Anesthesia Preprocedure Evaluation (Signed)
Anesthesia Evaluation  Patient identified by MRN, date of birth, ID band Patient awake    Reviewed: Allergy & Precautions, NPO status , Patient's Chart, lab work & pertinent test results  Airway Mallampati: II       Dental  (+) Upper Dentures   Pulmonary former smoker,    Pulmonary exam normal        Cardiovascular Exercise Tolerance: Good hypertension, Pt. on home beta blockers + CAD and + Past MI   Rhythm:Regular Rate:Normal     Neuro/Psych negative neurological ROS  negative psych ROS   GI/Hepatic Neg liver ROS, GERD  ,  Endo/Other  negative endocrine ROS  Renal/GU negative Renal ROS     Musculoskeletal   Abdominal Normal abdominal exam  (+)   Peds  Hematology negative hematology ROS (+)   Anesthesia Other Findings   Reproductive/Obstetrics                             Anesthesia Physical Anesthesia Plan  ASA: III  Anesthesia Plan: General   Post-op Pain Management:    Induction: Intravenous  Airway Management Planned: Natural Airway and Nasal Cannula  Additional Equipment:   Intra-op Plan:   Post-operative Plan:   Informed Consent: I have reviewed the patients History and Physical, chart, labs and discussed the procedure including the risks, benefits and alternatives for the proposed anesthesia with the patient or authorized representative who has indicated his/her understanding and acceptance.     Plan Discussed with: CRNA  Anesthesia Plan Comments:         Anesthesia Quick Evaluation

## 2015-06-08 NOTE — Anesthesia Postprocedure Evaluation (Signed)
Anesthesia Post Note  Patient: Luke Rojas  Procedure(s) Performed: Procedure(s) (LRB): COLONOSCOPY WITH PROPOFOL (N/A)  Patient location during evaluation: PACU Anesthesia Type: General Level of consciousness: awake Pain management: pain level controlled Vital Signs Assessment: post-procedure vital signs reviewed and stable Respiratory status: spontaneous breathing Cardiovascular status: blood pressure returned to baseline Anesthetic complications: no    Last Vitals:  Filed Vitals:   06/08/15 0947  BP: 132/75  Pulse: 59  Temp: 36.6 C  Resp: 18    Last Pain: There were no vitals filed for this visit.               VAN STAVEREN,Genasis Zingale

## 2015-06-08 NOTE — H&P (Signed)
NEIKO TRIVEDI [id number] 1945-10-06     HPI: Candidate for screening colonoscopy. No health issues since last exam. Tolerated prep well.   Prescriptions prior to admission  Medication Sig Dispense Refill Last Dose  . metoprolol (LOPRESSOR) 50 MG tablet Take 1 tablet by mouth. Morning and night   06/08/2015 at 0600  . aspirin 81 MG tablet Take 81 mg by mouth daily.     Marland Kitchen atorvastatin (LIPITOR) 40 MG tablet Take 1 tablet by mouth daily.   Taking  . clopidogrel (PLAVIX) 75 MG tablet    Taking  . MULTIPLE VITAMIN PO Take 1 tablet by mouth daily.   Taking  . naproxen (NAPROSYN) 500 MG tablet Take 500 mg by mouth 2 (two) times daily with a meal.     . nitroGLYCERIN (NITROSTAT) 0.4 MG SL tablet Place 1 tablet under the tongue. Reported on 03/24/2015   Taking  . Omega-3 Fatty Acids (FISH OIL) 1200 MG CAPS Take 1 capsule by mouth 2 (two) times daily.   Taking  . omeprazole (PRILOSEC) 20 MG capsule    Taking  . polyethylene glycol powder (GLYCOLAX/MIRALAX) powder 255 grams one bottle for colonoscopy prep 255 g 0   . ramipril (ALTACE) 10 MG capsule Take 1 capsule by mouth daily.   Taking   Allergies  Allergen Reactions  . Codeine Nausea Only   Past Medical History  Diagnosis Date  . Hypertension   . Chronic kidney disease     kidney stones  . GERD (gastroesophageal reflux disease)   . Hypercholesteremia   . Coronary artery disease     2 stents   . Myocardial infarction [facility]) 2013   Past Surgical History  Procedure Laterality Date  . Extracorporeal shock wave lithotripsy Left 11/04/2014    has had 2 previous lithotripsies  . Cardiac catheterization  2013    2 stents  . Extracorporeal shock wave lithotripsy Left 03/24/2015    Procedure: EXTRACORPOREAL SHOCK WAVE LITHOTRIPSY (ESWL);  Surgeon: Royston Cowper, MD;  Location: [facility] ORS;  Service: Urology;  Laterality: Left;  . Colonoscopy  04/27/05   Social History   Social History  . Marital Status: Married    Spouse Name: N/A  .  Number of Children: N/A  . Years of Education: N/A   Occupational History  . Not on file.   Social History Main Topics  . Smoking status: Former Smoker -- 1.00 packs/day for 30 years    Types: Cigarettes    Quit date: 03/05/1984  . Smokeless tobacco: Current User    Types: Chew  . Alcohol Use: No  . Drug Use: No  . Sexual Activity: Not on file   Other Topics Concern  . Not on file   Social History Narrative   Social History   Social History Narrative     ROS: Negative.     PE: HEENT: Negative. Lungs: Clear. Cardio: RR. Robert Bellow 06/08/2015   Assessment/Plan:  Proceed with planned endoscopy.

## 2015-06-15 ENCOUNTER — Other Ambulatory Visit: Payer: Self-pay

## 2015-06-15 NOTE — Telephone Encounter (Signed)
Refill request received from [facility] requesting Naproxen 500 mg.

## 2015-06-16 MED ORDER — NAPROXEN 500 MG PO TABS: 500.0000 mg | ORAL_TABLET | Freq: Two times a day (BID) | ORAL | Status: DC

## 2015-08-04 ENCOUNTER — Encounter: Payer: Self-pay | Admitting: General Surgery

## 2015-08-04 DIAGNOSIS — I2510 Atherosclerotic heart disease of native coronary artery without angina pectoris: Secondary | ICD-10-CM | POA: Diagnosis not present

## 2015-08-04 DIAGNOSIS — E785 Hyperlipidemia, unspecified: Secondary | ICD-10-CM | POA: Diagnosis not present

## 2015-08-04 DIAGNOSIS — I209 Angina pectoris, unspecified: Secondary | ICD-10-CM | POA: Diagnosis not present

## 2015-08-04 DIAGNOSIS — K219 Gastro-esophageal reflux disease without esophagitis: Secondary | ICD-10-CM | POA: Diagnosis not present

## 2015-10-19 ENCOUNTER — Other Ambulatory Visit: Payer: Self-pay | Admitting: Family Medicine

## 2015-10-19 NOTE — Telephone Encounter (Signed)
Is a Luke Rojas pt. Has appointment with him on 10/31/2015. Renaldo Fiddler, CMA

## 2015-10-31 ENCOUNTER — Encounter: Payer: Self-pay | Admitting: Family Medicine

## 2015-10-31 ENCOUNTER — Ambulatory Visit (INDEPENDENT_AMBULATORY_CARE_PROVIDER_SITE_OTHER): Payer: Medicare Other | Admitting: Family Medicine

## 2015-10-31 VITALS — BP 130/76 | HR 63 | Temp 97.7°F | Resp 14 | Ht 66.5 in | Wt 200.2 lb

## 2015-10-31 DIAGNOSIS — Z0000 Encounter for general adult medical examination without abnormal findings: Secondary | ICD-10-CM | POA: Diagnosis not present

## 2015-10-31 DIAGNOSIS — I10 Essential (primary) hypertension: Secondary | ICD-10-CM | POA: Diagnosis not present

## 2015-10-31 DIAGNOSIS — Z23 Encounter for immunization: Secondary | ICD-10-CM

## 2015-10-31 DIAGNOSIS — I252 Old myocardial infarction: Secondary | ICD-10-CM | POA: Diagnosis not present

## 2015-10-31 DIAGNOSIS — E785 Hyperlipidemia, unspecified: Secondary | ICD-10-CM | POA: Diagnosis not present

## 2015-10-31 LAB — POCT URINALYSIS DIPSTICK
Bilirubin, UA: NEGATIVE
GLUCOSE UA: NEGATIVE
KETONES UA: NEGATIVE
Leukocytes, UA: NEGATIVE
Nitrite, UA: NEGATIVE
Protein, UA: NEGATIVE
RBC UA: NEGATIVE
UROBILINOGEN UA: 0.2
pH, UA: 5

## 2015-10-31 NOTE — Progress Notes (Signed)
Patient: Luke Rojas, Male    DOB: 23-Jul-1945, 70 y.o.   MRN: [id number] Visit Date: 10/31/2015  Today's Provider: Vernie Murders, PA   No chief complaint on file.  Subjective:    Annual wellness visit Luke Rojas is a 70 y.o. male who presents today for his Subsequent Annual Wellness Visit. He feels well. He reports exercising none. He reports he is sleeping well.  ----------------------------------------------------------- Prevnar: 01/07/2014 Pneumovax: 03/31/2015 Tdap: 09/23/2009 Zoster: 04/02/2011 Colonoscopy: 06/08/15 normal   Review of Systems  Constitutional: Negative.   Respiratory: Negative.   Cardiovascular: Negative.   Musculoskeletal: Positive for arthralgias.  Neurological: Positive for numbness.    Social History   Social History  . Marital status: Married    Spouse name: N/A  . Number of children: N/A  . Years of education: N/A   Occupational History  . Not on file.   Social History Main Topics  . Smoking status: Former Smoker    Packs/day: 1.00    Years: 30.00    Types: Cigarettes    Quit date: 03/05/1984  . Smokeless tobacco: Current User    Types: Chew  . Alcohol use No  . Drug use: No  . Sexual activity: Not on file   Other Topics Concern  . Not on file   Social History Narrative  . No narrative on file    Patient Active Problem List   Diagnosis Date Noted  . Encounter for screening colonoscopy 05/04/2015  . Arthritis 10/08/2014  . H/O acute myocardial infarction 10/08/2014  . Acid reflux 10/08/2014  . HLD (hyperlipidemia) 10/08/2014  . BP (high blood pressure) 10/08/2014  . 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 Benign neoplasm of skin 07/19/2006  . Essential (primary) hypertension 11/15/2005  . Decreased potassium in the blood 11/15/2005    Past Surgical History:  Procedure Laterality Date  . CARDIAC CATHETERIZATION  2013   2 stents  . COLONOSCOPY  04/27/05  . COLONOSCOPY WITH PROPOFOL N/A  06/08/2015   Procedure: COLONOSCOPY WITH PROPOFOL;  Surgeon: Robert Bellow, MD;  Location[facility] END[facility]COPY;  Service: Endoscopy;  Laterality: N/A;  . EXTRACORPOREAL SHOCK WAVE LITHOTRIPSY Left 11/04/2014   has had 2 previous lithotripsies  . EXTRACORPOREAL SHOCK WAVE LITHOTRIPSY Left 03/24/2015   Procedure: EXTRACORPOREAL SHOCK WAVE LITHOTRIPSY (ESWL);  Surgeon: Royston Cowper, MD;  Location: [facility] ORS;  Service: Urology;  Laterality: Left;    His family history includes Cirrhosis in his brother; Hypertension in his father; Pancreatic cancer in his father; Pulmonary embolism in his mother; Transient ischemic attack in his mother.    Allergies  Allergen Reactions  . Codeine Nausea Only   Previous Medications   ASPIRIN 81 MG TABLET    Take 81 mg by mouth daily.   ATORVASTATIN (LIPITOR) 40 MG TABLET    Take 1 tablet by mouth daily.   CLOPIDOGREL (PLAVIX) 75 MG TABLET       METOPROLOL (LOPRESSOR) 50 MG TABLET    Take 1 tablet by mouth. Morning and night   MULTIPLE VITAMIN PO    Take 1 tablet by mouth daily.   NAPROXEN (NAPROSYN) 500 MG TABLET    TAKE 1 TABLET BY MOUTH TWICE A DAY WITH A MEAL   NITROGLYCERIN (NITROSTAT) 0.4 MG SL TABLET    Place 1 tablet under the tongue. Reported on 03/24/2015   OMEGA-3 FATTY ACIDS (FISH OIL) 1200 MG CAPS    Take 1 capsule by mouth 2 (two) times daily.   OMEPRAZOLE (PRILOSEC)  20 MG CAPSULE       RAMIPRIL (ALTACE) 10 MG CAPSULE    Take 1 capsule by mouth daily.    Patient Care Team: Margo Common, PA as PCP - General (Physician Assistant)     Objective:   Vitals: BP 130/76 (BP Location: Right Arm, Patient Position: Sitting, Cuff Size: Normal)   Pulse 63   Temp 97.7 F (36.5 C) (Oral)   Resp 14   Ht 5' 6.5" (1.689 m)   Wt 200 lb 3.2 oz (90.8 kg)   BMI 31.83 kg/m   Physical Exam  Activities of Daily Living In your present state of health, do you have any difficulty performing the following activities: 10/31/2015  Hearing? N  Vision? N   Difficulty concentrating or making decisions? N  Walking or climbing stairs? N  Dressing or bathing? N  Doing errands, shopping? N  Some recent data might be hidden    Fall Risk Assessment Fall Risk  10/31/2015  Falls in the past year? No     Depression Screen PHQ 2/9 Scores 10/31/2015  PHQ - 2 Score 0    Cognitive Testing - 6-CIT  Correct? Score   What year is it? yes 0 0 or 4  What month is it? yes 0 0 or 3  Memorize:    Pia Mau,  41,  [place],      What time is it? (within 1 hour) yes 0 0 or 3  Count backwards from 20 yes 0 0, 2, or 4  Name the months of the year yes 0 0, 2, or 4  Repeat name & address above no 8 0, 2, 4, 6, 8, or 10       TOTAL SCORE  8/28   Interpretation:  Abnormal- 8  Normal (0-7) Abnormal (8-28)   Audit-C Alcohol Use Screening  Question Answer Points  How often do you have alcoholic drink? never 0  On days you do drink alcohol, how many drinks do you typically consume? 0 0  How oftey will you drink 6 or more in a total? never 0  Total Score:  0   A score of 3 or more in women, and 4 or more in men indicates increased risk for alcohol abuse, EXCEPT if all of the points are from question 1.  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      Immunization History  Administered Date(s) Administered  . Pneumococcal Conjugate-13 01/07/2014  . Pneumococcal Polysaccharide-23 03/31/2015  . Tdap 09/23/2009  . Zoster 04/02/2011    Health Maintenance  Topic Date Due  . Hepatitis C Screening  1946-01-03  . INFLUENZA VACCINE   10/04/2015  . TETANUS/TDAP  09/24/2019  . COLONOSCOPY  06/07/2025  . ZOSTAVAX  Completed  . PNA vac Low Risk Adult  Completed    Discussed health benefits of physical activity, and encouraged him to engage in regular exercise appropriate for his age and condition.    ------------------------------------------------------------------------------------------------------------ 1. Medicare annual wellness visit, subsequent General health good. Colonoscopy and immunizations up to date. - POCT Urinalysis Dipstick  2. Need for influenza vaccination - Flu  vaccine HIGH DOSE PF  3. Essential (primary) hypertension Stable and BP controlled. No side effects. Still taking Ramipril 10 mg qd.  4. HLD (hyperlipidemia) Tolerating Atorvastatin 40 mg qd without side effects. Continues low fat diet.  5. H/O acute myocardial infarction No chest pains or palpitations. Still followed by Dr. Clayborn Bigness (cardiologist) annually. Has not had to use NTG. Taking ASA 81 mg qd. Continue follow up with cardiologist regularly.

## 2015-11-04 ENCOUNTER — Encounter: Payer: Self-pay | Admitting: General Surgery

## 2015-12-22 ENCOUNTER — Telehealth: Payer: Self-pay | Admitting: Family Medicine

## 2015-12-22 NOTE — Telephone Encounter (Signed)
LM for pt to sched AWV in Nov (or whenever), j. Jennette Bill

## 2016-02-21 ENCOUNTER — Encounter: Payer: Self-pay | Admitting: Family Medicine

## 2016-02-21 ENCOUNTER — Ambulatory Visit (INDEPENDENT_AMBULATORY_CARE_PROVIDER_SITE_OTHER): Payer: Medicare Other | Admitting: Family Medicine

## 2016-02-21 VITALS — BP 112/60 | HR 60 | Temp 98.2°F | Resp 16 | Wt 197.8 lb

## 2016-02-21 DIAGNOSIS — N3001 Acute cystitis with hematuria: Secondary | ICD-10-CM | POA: Diagnosis not present

## 2016-02-21 LAB — POCT URINALYSIS DIPSTICK
Bilirubin, UA: NOT DETECTED
GLUCOSE UA: NEGATIVE
KETONES UA: NEGATIVE
Nitrite, UA: NEGATIVE
UROBILINOGEN UA: 0.2
pH, UA: 5

## 2016-02-21 MED ORDER — CIPROFLOXACIN HCL 500 MG PO TABS: 500.0000 mg | ORAL_TABLET | Freq: Two times a day (BID) | ORAL | 0 refills | Status: DC

## 2016-02-21 MED ORDER — PHENAZOPYRIDINE HCL 200 MG PO TABS: 200.0000 mg | ORAL_TABLET | Freq: Three times a day (TID) | ORAL | 0 refills | Status: DC | PRN

## 2016-02-21 NOTE — Patient Instructions (Signed)
Urinary Tract Infection, Adult A urinary tract infection (UTI) is an infection of any part of the urinary tract, which includes the kidneys, ureters, bladder, and urethra. These organs make, store, and get rid of urine in the body. UTI can be a bladder infection (cystitis) or kidney infection (pyelonephritis). What are the causes? This infection may be caused by fungi, viruses, or bacteria. Bacteria are the most common cause of UTIs. This condition can also be caused by repeated incomplete emptying of the bladder during urination. What increases the risk? This condition is more likely to develop if:  You ignore your need to urinate or hold urine for long periods of time.  You do not empty your bladder completely during urination.  You wipe back to front after urinating or having a bowel movement, if you are male.  You are uncircumcised, if you are male.  You are constipated.  You have a urinary catheter that stays in place (indwelling).  You have a weak defense (immune) system.  You have a medical condition that affects your bowels, kidneys, or bladder.  You have diabetes.  You take antibiotic medicines frequently or for long periods of time, and the antibiotics no longer work well against certain types of infections (antibiotic resistance).  You take medicines that irritate your urinary tract.  You are exposed to chemicals that irritate your urinary tract.  You are male. What are the signs or symptoms? Symptoms of this condition include:  Fever.  Frequent urination or passing small amounts of urine frequently.  Needing to urinate urgently.  Pain or burning with urination.  Urine that smells bad or unusual.  Cloudy urine.  Pain in the lower abdomen or back.  Trouble urinating.  Blood in the urine.  Vomiting or being less hungry than normal.  Diarrhea or abdominal pain.  Vaginal discharge, if you are male. How is this diagnosed? This condition is  diagnosed with a medical history and physical exam. You will also need to provide a urine sample to test your urine. Other tests may be done, including:  Blood tests.  Sexually transmitted disease (STD) testing. If you have had more than one UTI, a cystoscopy or imaging studies may be done to determine the cause of the infections. How is this treated? Treatment for this condition often includes a combination of two or more of the following:  Antibiotic medicine.  Other medicines to treat less common causes of UTI.  Over-the-counter medicines to treat pain.  Drinking enough water to stay hydrated. Follow these instructions at home:  Take over-the-counter and prescription medicines only as told by your health care provider.  If you were prescribed an antibiotic, take it as told by your health care provider. Do not stop taking the antibiotic even if you start to feel better.  Avoid alcohol, caffeine, tea, and carbonated beverages. They can irritate your bladder.  Drink enough fluid to keep your urine clear or pale yellow.  Keep all follow-up visits as told by your health care provider. This is important.  Make sure to:  Empty your bladder often and completely. Do not hold urine for long periods of time.  Empty your bladder before and after sex.  Wipe from front to back after a bowel movement if you are male. Use each tissue one time when you wipe. Contact a health care provider if:  You have back pain.  You have a fever.  You feel nauseous or vomit.  Your symptoms do not get better after 3   days.  Your symptoms go away and then return. Get help right away if:  You have severe back pain or lower abdominal pain.  You are vomiting and cannot keep down any medicines or water. This information is not intended to replace advice given to you by your health care provider. Make sure you discuss any questions you have with your health care provider. Document Released:  11/29/2004 Document Revised: 08/03/2015 Document Reviewed: 01/10/2015 Elsevier Interactive Patient Education  2017 Elsevier Inc.  

## 2016-02-21 NOTE — Progress Notes (Signed)
Patient: Luke Rojas Male    DOB: [birth year]   70 y.o.   MRN: [id number] Visit Date: 02/21/2016  Today's Provider: Vernie Murders, PA   Chief Complaint  Patient presents with  . Urinary Tract Infection   Subjective:    Urinary Tract Infection   This is a new problem. Episode onset: Sunday. The problem occurs every urination. The problem has been unchanged. The quality of the pain is described as burning. Associated symptoms include frequency. Associated symptoms comments: Lower back pain, headache, and decreased urine output . He has tried nothing for the symptoms.   Past Medical History:  Diagnosis Date  . Chronic kidney disease    kidney stones  . Coronary artery disease    2 stents   . GERD (gastroesophageal reflux disease)   . Hypercholesteremia   . Hypertension   . Myocardial infarction 2013   Past Surgical History:  Procedure Laterality Date  . CARDIAC CATHETERIZATION  2013   2 stents  . COLONOSCOPY  04/27/05  . COLONOSCOPY WITH PROPOFOL N/A 06/08/2015   Procedure: COLONOSCOPY WITH PROPOFOL;  Surgeon: Robert Bellow, MD;  Location[facility] ENDOSCOPY;  Service: Endoscopy;  Laterality: N/A;  . EXTRACORPOREAL SHOCK WAVE LITHOTRIPSY Left 11/04/2014   has had 2 previous lithotripsies  . EXTRACORPOREAL SHOCK WAVE LITHOTRIPSY Left 03/24/2015   Procedure: EXTRACORPOREAL SHOCK WAVE LITHOTRIPSY (ESWL);  Surgeon: Royston Cowper, MD;  Location: [facility] ORS;  Service: Urology;  Laterality: Left;   Family History  Problem Relation Age of Onset  . Pulmonary embolism Mother   . Transient ischemic attack Mother   . Pancreatic cancer Father   . Hypertension Father   . Cirrhosis Brother    Allergies  Allergen Reactions  . Codeine Nausea Only     Previous Medications   ASPIRIN 81 MG TABLET    Take 81 mg by mouth daily.   ATORVASTATIN (LIPITOR) 40 MG TABLET    Take 1 tablet by mouth daily.   CLOPIDOGREL (PLAVIX) 75 MG TABLET       METOPROLOL (LOPRESSOR) 50 MG TABLET    Take  1 tablet by mouth. Morning and night   MULTIPLE VITAMIN PO    Take 1 tablet by mouth daily.   NAPROXEN (NAPROSYN) 500 MG TABLET    TAKE 1 TABLET BY MOUTH TWICE A DAY WITH A MEAL   NITROGLYCERIN (NITROSTAT) 0.4 MG SL TABLET    Place 1 tablet under the tongue. Reported on 03/24/2015   OMEGA-3 FATTY ACIDS (FISH OIL) 1200 MG CAPS    Take 1 capsule by mouth 2 (two) times daily.   OMEPRAZOLE (PRILOSEC) 20 MG CAPSULE       RAMIPRIL (ALTACE) 10 MG CAPSULE    Take 1 capsule by mouth daily.    Review of Systems  Constitutional: Negative.   Respiratory: Negative.   Cardiovascular: Negative.   Genitourinary: Positive for decreased urine volume, dysuria and frequency.  Musculoskeletal: Positive for back pain.  Neurological: Positive for headaches.    Social History  Substance Use Topics  . Smoking status: Former Smoker    Packs/day: 1.00    Years: 30.00    Types: Cigarettes    Quit date: 03/05/1984  . Smokeless tobacco: Current User    Types: Chew  . Alcohol use No   Objective:   BP 112/60 (BP Location: Right Arm, Patient Position: Sitting, Cuff Size: Normal)   Pulse 60   Temp 98.2 F (36.8 C) (Oral)   Resp 16   Wt 197  lb 12.8 oz (89.7 kg)   BMI 31.45 kg/m   Physical Exam  Constitutional: He is oriented to person, place, and time. He appears well-developed and well-nourished. No distress.  HENT:  Head: Normocephalic and atraumatic.  Right Ear: Hearing normal.  Left Ear: Hearing normal.  Nose: Nose normal.  Eyes: Conjunctivae and lids are normal. Right eye exhibits no discharge. Left eye exhibits no discharge. No scleral icterus.  Neck: Neck supple.  Cardiovascular: Normal rate and regular rhythm.   Pulmonary/Chest: Effort normal and breath sounds normal. No respiratory distress.  Abdominal: Soft. Bowel sounds are normal. There is tenderness.  Slight suprapubic discomfort to palpate. No CVA tenderness to percussion posteriorly.  Musculoskeletal: Normal range of motion.    Neurological: He is alert and oriented to person, place, and time.  Skin: Skin is intact. No lesion and no rash noted.  Psychiatric: He has a normal mood and affect. His speech is normal and behavior is normal. Thought content normal.      Assessment & Plan:     1. Acute cystitis with hematuria Onset with low back ache and urinary frequency 2 days ago. No gross hematuria. Took two Excedrin and back pain with headache resolved. Burning with frequent urination today and darker urine. Urinalysis showed TNTC WBC's, RBC's, and bacteria. Will get antibiotic and analgesic. Increase water intake and send specimen to lab for C&S. Recheck pending reports. Has a history of lithotripsy for renal stones in January 2017. - POCT Urinalysis Dipstick - Urine culture - ciprofloxacin (CIPRO) 500 MG tablet; Take 1 tablet (500 mg total) by mouth 2 (two) times daily.  Dispense: 20 tablet; Refill: 0 - phenazopyridine (PYRIDIUM) 200 MG tablet; Take 1 tablet (200 mg total) by mouth 3 (three) times daily as needed for pain.  Dispense: 12 tablet; Refill: 0

## 2016-02-23 ENCOUNTER — Telehealth: Payer: Self-pay

## 2016-02-23 LAB — URINE CULTURE

## 2016-02-23 NOTE — Telephone Encounter (Signed)
-----   Message from Margo Common, [place] sent at 02/23/2016  8:14 AM EST ----- Urine culture preliminary report shows E.coli bacteria. Awaiting the sensitivity to antibiotics report. Usually the Cipro, you were given, will clear this infection. Continue to drink the extra fluids to help flush this out.

## 2016-02-23 NOTE — Telephone Encounter (Signed)
Patient advised as below. Patient verbalizes understanding and is in agreement with treatment plan.  

## 2016-03-15 ENCOUNTER — Encounter: Payer: Self-pay | Admitting: Family Medicine

## 2016-03-15 ENCOUNTER — Ambulatory Visit (INDEPENDENT_AMBULATORY_CARE_PROVIDER_SITE_OTHER): Payer: Medicare Other | Admitting: Family Medicine

## 2016-03-15 VITALS — BP 126/60 | HR 55 | Temp 97.4°F | Resp 16 | Wt 199.6 lb

## 2016-03-15 DIAGNOSIS — B9789 Other viral agents as the cause of diseases classified elsewhere: Secondary | ICD-10-CM

## 2016-03-15 DIAGNOSIS — J069 Acute upper respiratory infection, unspecified: Secondary | ICD-10-CM | POA: Diagnosis not present

## 2016-03-15 NOTE — Patient Instructions (Signed)
Discussed use of Mucinex D for congestion and Delsym for cough. Also saline nasal spray may help clear your sinuses.

## 2016-03-15 NOTE — Progress Notes (Signed)
Subjective:     Patient ID: Luke Rojas, male   DOB: [birth year], 71 y.o.   MRN: [id number]  HPI  Chief Complaint  Patient presents with  . Cough    Patient comes in office today with complaints of cough, congestion and sore throat for the past 2 days. Associated with cough patient complains of the following; wheezing, shortness of breath, runny nose, blurred vision and headache. Patient denies taking any otc medication to treat symptoms.   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 + flu shot.   Review of Systems     Objective:   Physical Exam  Constitutional: He appears well-developed and well-nourished. No distress.  Ears: T.M's obscured by cerumen Throat: no tonsillar enlargement or exudate Neck: no cervical adenopathy Lungs: clear     Assessment:    1. Viral upper respiratory tract infection    Plan:    discussed use of Mucinex D, Delsym and saline nasal spray. Call or return if not improving over the next 4-5 days.

## 2016-03-25 ENCOUNTER — Emergency Department: Payer: Medicare Other

## 2016-03-25 ENCOUNTER — Encounter: Payer: Self-pay | Admitting: Emergency Medicine

## 2016-03-25 ENCOUNTER — Emergency Department
Admission: EM | Admit: 2016-03-25 | Discharge: 2016-03-25 | Disposition: A | Discharge status: Home / Self Care | Payer: Medicare Other | Attending: Emergency Medicine | Admitting: Emergency Medicine

## 2016-03-25 DIAGNOSIS — F17220 Nicotine dependence, chewing tobacco, uncomplicated: Secondary | ICD-10-CM | POA: Insufficient documentation

## 2016-03-25 DIAGNOSIS — Z79899 Other long term (current) drug therapy: Secondary | ICD-10-CM | POA: Insufficient documentation

## 2016-03-25 DIAGNOSIS — M1611 Unilateral primary osteoarthritis, right hip: Secondary | ICD-10-CM | POA: Diagnosis not present

## 2016-03-25 DIAGNOSIS — I252 Old myocardial infarction: Secondary | ICD-10-CM | POA: Insufficient documentation

## 2016-03-25 DIAGNOSIS — N189 Chronic kidney disease, unspecified: Secondary | ICD-10-CM | POA: Diagnosis not present

## 2016-03-25 DIAGNOSIS — I129 Hypertensive chronic kidney disease with stage 1 through stage 4 chronic kidney disease, or unspecified chronic kidney disease: Secondary | ICD-10-CM | POA: Diagnosis not present

## 2016-03-25 DIAGNOSIS — M25551 Pain in right hip: Secondary | ICD-10-CM | POA: Diagnosis present

## 2016-03-25 DIAGNOSIS — I2510 Atherosclerotic heart disease of native coronary artery without angina pectoris: Secondary | ICD-10-CM | POA: Diagnosis not present

## 2016-03-25 DIAGNOSIS — Z7982 Long term (current) use of aspirin: Secondary | ICD-10-CM | POA: Insufficient documentation

## 2016-03-25 MED ORDER — TRAMADOL HCL 50 MG PO TABS
50.0000 mg | ORAL_TABLET | Freq: Once | ORAL | Status: AC
  Administered 2016-03-25: 50 mg via ORAL
  Filled 2016-03-25: qty 1

## 2016-03-25 MED ORDER — TRAMADOL HCL 50 MG PO TABS: 50.0000 mg | ORAL_TABLET | Freq: Four times a day (QID) | ORAL | 0 refills | Status: DC | PRN

## 2016-03-25 MED ORDER — MELOXICAM 15 MG PO TABS: 15.0000 mg | ORAL_TABLET | Freq: Every day | ORAL | 0 refills | Status: DC

## 2016-03-25 NOTE — ED Provider Notes (Signed)
[facility] Emergency Department Provider Note   ____________________________________________   First MD Initiated Contact with Patient 03/25/16 1237     (approximate)  I have reviewed the triage vital signs and the nursing notes.   HISTORY  Chief Complaint Hip Pain   HPI PAWAN KNECHTEL is a 71 y.o. male is here with complaint of right hip pain. Patient states pain started approximately 2 days ago and has continued since. Patient is taken some over-the-counter ibuprofen occasionally with minimal relief. Patient denies any history of injury or prior problems with his right hip. He is continued to ambulate with discomfort. He denies any back pain.Patient states he continues taking his regular medication as prescribed by his PCP. Also on his list naproxen 500 mg twice a day was listed. When asked patient states that he continues to take that medication as well.   Past Medical History:  Diagnosis Date  . Chronic kidney disease    kidney stones  . Coronary artery disease    2 stents   . GERD (gastroesophageal reflux disease)   . Hypercholesteremia   . Hypertension   . Myocardial infarction 2013    Patient Active Problem List   Diagnosis Date Noted  . Encounter for screening colonoscopy 05/04/2015  . Arthritis 10/08/2014  . H/O acute myocardial infarction 10/08/2014  . Acid reflux 10/08/2014  . HLD (hyperlipidemia) 10/08/2014  . BP (high blood pressure) 10/08/2014  . 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 Benign neoplasm of skin 07/19/2006  . Essential (primary) hypertension 11/15/2005  . Decreased potassium in the blood 11/15/2005    Past Surgical History:  Procedure Laterality Date  . CARDIAC CATHETERIZATION  2013   2 stents  . COLONOSCOPY  04/27/05  . COLONOSCOPY WITH PROPOFOL N/A 06/08/2015   Procedure: COLONOSCOPY WITH PROPOFOL;  Surgeon: Robert Bellow, MD;  Location: [facility] ENDOSCOPY;  Service: Endoscopy;   Laterality: N/A;  . EXTRACORPOREAL SHOCK WAVE LITHOTRIPSY Left 11/04/2014   has had 2 previous lithotripsies  . EXTRACORPOREAL SHOCK WAVE LITHOTRIPSY Left 03/24/2015   Procedure: EXTRACORPOREAL SHOCK WAVE LITHOTRIPSY (ESWL);  Surgeon: Royston Cowper, MD;  Location: [facility] ORS;  Service: Urology;  Laterality: Left;    Prior to Admission medications   Medication Sig Start Date End Date Taking? Authorizing Provider  aspirin 81 MG tablet Take 81 mg by mouth daily.    Historical Provider, MD  atorvastatin (LIPITOR) 40 MG tablet Take 1 tablet by mouth daily. 09/29/11   Historical Provider, MD  ciprofloxacin (CIPRO) 500 MG tablet Take 1 tablet (500 mg total) by mouth 2 (two) times daily. 02/21/16   Vickki Muff Chrismon, PA  clopidogrel (PLAVIX) 75 MG tablet  01/12/15   Historical Provider, MD  meloxicam (MOBIC) 15 MG tablet Take 1 tablet (15 mg total) by mouth daily. 03/25/16 03/25/17  Johnn Hai, PA-C  metoprolol (LOPRESSOR) 50 MG tablet Take 1 tablet by mouth. Morning and night 07/08/14   Historical Provider, MD  MULTIPLE VITAMIN PO Take 1 tablet by mouth daily.    Historical Provider, MD  nitroGLYCERIN (NITROSTAT) 0.4 MG SL tablet Place 1 tablet under the tongue. Reported on 03/24/2015 09/29/11   Historical Provider, MD  Omega-3 Fatty Acids (FISH OIL) 1200 MG CAPS Take 1 capsule by mouth 2 (two) times daily.    Historical Provider, MD  omeprazole (PRILOSEC) 20 MG capsule  01/26/15   Historical Provider, MD  phenazopyridine (PYRIDIUM) 200 MG tablet Take 1 tablet (200 mg total) by mouth  3 (three) times daily as needed for pain. 02/21/16   Vickki Muff Chrismon, PA  ramipril (ALTACE) 10 MG capsule Take 1 capsule by mouth daily. 07/08/14   Historical Provider, MD  traMADol (ULTRAM) 50 MG tablet Take 1 tablet (50 mg total) by mouth every 6 (six) hours as needed. 03/25/16   Johnn Hai, PA-C    Allergies Codeine  Family History  Problem Relation Age of Onset  . Pulmonary embolism Mother   . Transient  ischemic attack Mother   . Pancreatic cancer Father   . Hypertension Father   . Cirrhosis Brother     Social History Social History  Substance Use Topics  . Smoking status: Former Smoker    Packs/day: 1.00    Years: 30.00    Types: Cigarettes    Quit date: 03/05/1984  . Smokeless tobacco: Current User    Types: Chew  . Alcohol use No    Review of Systems Constitutional: No fever/chills Eyes: No visual changes. Cardiovascular: Denies chest pain. Respiratory: Denies shortness of breath. Gastrointestinal: No abdominal pain.  No nausea, no vomiting.   Musculoskeletal: Negative for back pain.Positive right hip pain. Skin: Negative for rash. Neurological: Negative for headaches, focal weakness or numbness.  10-point ROS otherwise negative.  ____________________________________________   PHYSICAL EXAM:  VITAL SIGNS: ED Triage Vitals  Enc Vitals Group     BP 03/25/16 1126 (!) 151/68     Pulse Rate 03/25/16 1124 (!) 57     Resp 03/25/16 1124 16     Temp 03/25/16 1124 98.1 F (36.7 C)     Temp src --      SpO2 03/25/16 1124 97 %     Weight 03/25/16 1125 200 lb (90.7 kg)     Height 03/25/16 1125 '5\' 9"'$  (1.753 m)     Head Circumference --      Peak Flow --      Pain Score 03/25/16 1127 0     Pain Loc --      Pain Edu? --      Excl. in [place]? --     Constitutional: Alert and oriented. Well appearing and in no acute distress. Eyes: Conjunctivae are normal. PERRL. EOMI. Head: Atraumatic. Nose: No congestion/rhinnorhea. Neck: No stridor.   Cardiovascular: Normal rate, regular rhythm. Grossly normal heart sounds.  Good peripheral circulation. Respiratory: Normal respiratory effort.  No retractions. Lungs CTAB. Gastrointestinal: Soft and nontender. Musculoskeletal: Patient is able to move upper and lower extremities. There is marked tenderness on palpation of the right lateral hip area. Range of motion is slightly restricted secondary to discomfort. Patient is ambulatory  without assistance. There  is no edema noted lower extremities. Patient does walk with a slight limp. Neurologic:  Normal speech and language. No gross focal neurologic deficits are appreciated. Skin:  Skin is warm, dry and intact. No rash noted. Psychiatric: Mood and affect are normal. Speech and behavior are normal.  ____________________________________________   LABS (all labs ordered are listed, but only abnormal results are displayed)  Labs Reviewed - No data to display  RADIOLOGY Right hip x-ray per radiologist shows no acute bony abnormality. There is mild right hip osteoarthritis. I, Johnn Hai, personally viewed and evaluated these images (plain radiographs) as part of my medical decision making, as well as reviewing the written report by the radiologist.  ____________________________________________   PROCEDURES  Procedure(s) performed: None  Procedures  Critical Care performed: No  ____________________________________________   INITIAL IMPRESSION / ASSESSMENT AND PLAN /  ED COURSE  Pertinent labs & imaging results that were available during my care of the patient were reviewed by me and considered in my medical decision making (see chart for details).  In evaluating the patient's medication he states that he has been on naproxen 500 mg twice a day for "a little bit long time". At this time he will discontinue taking the naproxen. He was given a prescription for meloxicam 15 mg one daily with food. He was also given a tramadol 50 mg while in the emergency room and a prescription for a limited amount for severe pain should he need it. Follow up with his primary care provider for any continued medication and further evaluation if needed    ___________________________________________   FINAL CLINICAL IMPRESSION(S) / ED DIAGNOSES  Final diagnoses:  Right hip pain  Osteoarthritis of right hip, unspecified osteoarthritis type      NEW MEDICATIONS STARTED  DURING THIS VISIT:  Discharge Medication List as of 03/25/2016  2:39 PM    START taking these medications   Details  meloxicam (MOBIC) 15 MG tablet Take 1 tablet (15 mg total) by mouth daily., Starting Sun 03/25/2016, Until Mon 03/25/2017, Print    traMADol (ULTRAM) 50 MG tablet Take 1 tablet (50 mg total) by mouth every 6 (six) hours as needed., Starting Sun 03/25/2016, Print         Note:  This document was prepared using Dragon voice recognition software and may include unintentional dictation errors.    Johnn Hai, PA-C 03/25/16 [place], MD 03/27/16 [phone]

## 2016-03-25 NOTE — ED Notes (Signed)
See triage note. Pt c/o pain to R hip radiating down R leg starting yesterday. Denies injury. Able to ambulate with limp.

## 2016-03-25 NOTE — Discharge Instructions (Signed)
Discontinue taking naproxen 500 mg twice a day. Begin taking meloxicam 15 mg one daily with food. Tramadol 50 mg 1 every 6 hours if needed for severe pain. This medication could cause drowsiness and increase your risk for falling.  Follow-up with your primary care doctor if any continued problems or not improving.

## 2016-03-25 NOTE — ED Notes (Signed)

## 2016-03-25 NOTE — ED Triage Notes (Signed)
Pt states rt hip pain that started yesterday and radiates into rt leg

## 2016-03-26 ENCOUNTER — Telehealth: Payer: Self-pay | Admitting: Family Medicine

## 2016-03-26 ENCOUNTER — Other Ambulatory Visit: Payer: Self-pay

## 2016-03-26 NOTE — Telephone Encounter (Signed)
I do not do TCM calls for ED only visits. They have to be admitted, sorry!

## 2016-03-26 NOTE — Telephone Encounter (Signed)
Faythe Ghee was not sure about that. Thank you

## 2016-03-26 NOTE — Telephone Encounter (Signed)
Pt was discharged from [facility] ER on 03/25/2016 for right hip and leg pain.  I have scheduled a hospital follow up appointment/MW

## 2016-03-27 ENCOUNTER — Ambulatory Visit
Admission: RE | Admit: 2016-03-27 | Discharge: 2016-03-27 | Disposition: A | Discharge status: Home / Self Care | Payer: Medicare Other | Source: Ambulatory Visit | Attending: Family Medicine | Admitting: Family Medicine

## 2016-03-27 ENCOUNTER — Ambulatory Visit (INDEPENDENT_AMBULATORY_CARE_PROVIDER_SITE_OTHER): Payer: Medicare Other | Admitting: Family Medicine

## 2016-03-27 ENCOUNTER — Telehealth: Payer: Self-pay

## 2016-03-27 ENCOUNTER — Encounter: Payer: Self-pay | Admitting: Family Medicine

## 2016-03-27 VITALS — BP 142/78 | HR 58 | Temp 98.0°F | Resp 14 | Wt 193.6 lb

## 2016-03-27 DIAGNOSIS — M545 Low back pain, unspecified: Secondary | ICD-10-CM

## 2016-03-27 DIAGNOSIS — M5136 Other intervertebral disc degeneration, lumbar region: Secondary | ICD-10-CM | POA: Diagnosis not present

## 2016-03-27 DIAGNOSIS — M5440 Lumbago with sciatica, unspecified side: Secondary | ICD-10-CM

## 2016-03-27 DIAGNOSIS — N2889 Other specified disorders of kidney and ureter: Secondary | ICD-10-CM | POA: Insufficient documentation

## 2016-03-27 DIAGNOSIS — I700 Atherosclerosis of aorta: Secondary | ICD-10-CM | POA: Diagnosis not present

## 2016-03-27 DIAGNOSIS — M1611 Unilateral primary osteoarthritis, right hip: Secondary | ICD-10-CM

## 2016-03-27 MED ORDER — HYDROCODONE-ACETAMINOPHEN 5-325 MG PO TABS
1.0000 | ORAL_TABLET | Freq: Four times a day (QID) | ORAL | 0 refills | Status: DC | PRN
Start: 2016-03-27 — End: 2016-10-04

## 2016-03-27 MED ORDER — PREDNISONE 5 MG PO TABS: 5.0000 mg | ORAL_TABLET | Freq: Every day | ORAL | 0 refills | Status: DC

## 2016-03-27 NOTE — Telephone Encounter (Signed)
Patient advised. He will call back if medication prescribed does not work.

## 2016-03-27 NOTE — Progress Notes (Signed)
Patient: Luke Rojas Male    DOB: 07-08-1945   71 y.o.   MRN: [id number] Visit Date: 03/27/2016  Today's Provider: Vernie Murders, PA   Chief Complaint  Patient presents with  . 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 Treatment for this included started Meloxicam and Tramadol. Stopped Naproxen. He reports good compliance with treatment. He reports this condition is Unchanged. Patient reports pain is 10/10 when standing and walking.   ------------------------------------------------------------------------------------ Patient Active Problem List   Diagnosis Date Noted  . Encounter for screening colonoscopy 05/04/2015  . Arthritis 10/08/2014  . H/O acute myocardial infarction 10/08/2014  . Acid reflux 10/08/2014  . HLD (hyperlipidemia) 10/08/2014  . BP (high blood pressure) 10/08/2014  . 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 Benign neoplasm of skin 07/19/2006  . Essential (primary) hypertension 11/15/2005  . Decreased potassium in the blood 11/15/2005   Past Surgical History:  Procedure Laterality Date  . CARDIAC CATHETERIZATION  2013   2 stents  . COLONOSCOPY  04/27/05  . COLONOSCOPY WITH PROPOFOL N/A 06/08/2015   Procedure: COLONOSCOPY WITH PROPOFOL;  Surgeon: Robert Bellow, MD;  Location: [facility] ENDOSCOPY;  Service: Endoscopy;  Laterality: N/A;  . EXTRACORPOREAL SHOCK WAVE LITHOTRIPSY Left 11/04/2014   has had 2 previous lithotripsies  . EXTRACORPOREAL SHOCK WAVE LITHOTRIPSY Left 03/24/2015   Procedure: EXTRACORPOREAL SHOCK WAVE LITHOTRIPSY (ESWL);  Surgeon: Royston Cowper, MD;  Location: [facility] ORS;  Service: Urology;  Laterality: Left;   Family History  Problem Relation Age of Onset  . Pulmonary embolism Mother   . Transient ischemic attack Mother   . Pancreatic cancer Father   . Hypertension Father   .  Cirrhosis Brother    Allergies  Allergen Reactions  . Codeine Nausea Only     Previous Medications   ASPIRIN 81 MG TABLET    Take 81 mg by mouth daily.   ATORVASTATIN (LIPITOR) 40 MG TABLET    Take 1 tablet by mouth daily.   CIPROFLOXACIN (CIPRO) 500 MG TABLET    Take 1 tablet (500 mg total) by mouth 2 (two) times daily.   CLOPIDOGREL (PLAVIX) 75 MG TABLET       MELOXICAM (MOBIC) 15 MG TABLET    Take 1 tablet (15 mg total) by mouth daily.   METOPROLOL (LOPRESSOR) 50 MG TABLET    Take 1 tablet by mouth. Morning and night   MULTIPLE VITAMIN PO    Take 1 tablet by mouth daily.   NAPROXEN (NAPROSYN) 500 MG TABLET       NITROGLYCERIN (NITROSTAT) 0.4 MG SL TABLET    Place 1 tablet under the tongue. Reported on 03/24/2015   OMEGA-3 FATTY ACIDS (FISH OIL) 1200 MG CAPS    Take 1 capsule by mouth 2 (two) times daily.   OMEPRAZOLE (PRILOSEC) 20 MG CAPSULE       PHENAZOPYRIDINE (PYRIDIUM) 200 MG TABLET    Take 1 tablet (200 mg total) by mouth 3 (three) times daily as needed for pain.   RAMIPRIL (ALTACE) 10 MG CAPSULE    Take 1 capsule by mouth daily.   TRAMADOL (ULTRAM) 50 MG TABLET    Take 1 tablet (50 mg total) by mouth every 6 (six) hours as needed.    Review of Systems  Constitutional: Negative.   Respiratory: Negative.   Cardiovascular: Negative.   Musculoskeletal: Positive  for arthralgias.    Social History  Substance Use Topics  . Smoking status: Former Smoker    Packs/day: 1.00    Years: 30.00    Types: Cigarettes    Quit date: 03/05/1984  . Smokeless tobacco: Current User    Types: Chew  . Alcohol use No   Objective:   BP (!) 142/78 (BP Location: Right Arm, Patient Position: Sitting, Cuff Size: Normal)   Pulse (!) 58   Temp 98 F (36.7 C) (Oral)   Resp 14   Wt 193 lb 9.6 oz (87.8 kg)   SpO2 99%   BMI 28.59 kg/m   Physical Exam  Constitutional: He is oriented to person, place, and time. He appears well-developed and well-nourished. No distress.  HENT:  Head:  Normocephalic and atraumatic.  Right Ear: Hearing normal.  Left Ear: Hearing normal.  Nose: Nose normal.  Eyes: Conjunctivae and lids are normal. Right eye exhibits no discharge. Left eye exhibits no discharge. No scleral icterus.  Neck: Neck supple.  Pulmonary/Chest: Effort normal. No respiratory distress.  Musculoskeletal: He exhibits tenderness.  Tender right lower back/sacrum with radiation down the lateral thigh to the lateral calf. No numbness. Increased pain with standing erect and walking. DTR's 1+ and symmetric. Good pulses in legs and feet. SLR's 90 degrees without pain. No hip pain to palpate or cross legs in figure-of-4 pattern.  Neurological: He is alert and oriented to person, place, and time.  Skin: Skin is intact. No lesion and no rash noted.  Psychiatric: He has a normal mood and affect. His speech is normal and behavior is normal. Thought content normal.      Assessment & Plan:     1. Low back pain with radiation Started the day after shoveling snow from his driveway. Unable to stand erect without sharp pain and radiation down the right leg to the calf. No relief from the Tramadol and Meloxicam. Some pain relief from sitting down or bending over while walking. Will get x-ray evaluation of the lower back. Treat with Prednisone taper for inflammation and given Norco for pain. May need orthopedic evaluation pending reports. - HYDROcodone-acetaminophen (NORCO/VICODIN) 5-325 MG tablet; Take 1 tablet by mouth every 6 (six) hours as needed for moderate pain.  Dispense: 30 tablet; Refill: 0 - DG Lumbar Spine Complete - predniSONE (DELTASONE) 5 MG tablet; Take 1 tablet (5 mg total) by mouth daily with breakfast. Taper by one tablet daily (6,5,4,3,2,1)  Dispense: 21 tablet; Refill: 0  2. Osteoarthritis of right hip, unspecified osteoarthritis type X-rays in ER on 03-25-16 showed arthritis in the right hip. Most of his pain is in the right lower back and some tenderness to palpation.  Good ROM of the right hip today. Will give pain med and prednisone taper for inflammation. Stop the Tramadol and Meloxicam since he is not afforded any relief. May need referral to an orthopedist pending response. - HYDROcodone-acetaminophen (NORCO/VICODIN) 5-325 MG tablet; Take 1 tablet by mouth every 6 (six) hours as needed for moderate pain.  Dispense: 30 tablet; Refill: 0 - predniSONE (DELTASONE) 5 MG tablet; Take 1 tablet (5 mg total) by mouth daily with breakfast. Taper by one tablet daily (6,5,4,3,2,1)  Dispense: 21 tablet; Refill: 0

## 2016-03-27 NOTE — Telephone Encounter (Signed)
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 Will need referral to an orthopedist/spine specialist if no better with use of prednisone and pain medication over the next 5-6 days.

## 2016-04-03 ENCOUNTER — Encounter: Payer: Self-pay | Admitting: Family Medicine

## 2016-04-03 ENCOUNTER — Ambulatory Visit (INDEPENDENT_AMBULATORY_CARE_PROVIDER_SITE_OTHER): Payer: Medicare Other | Admitting: Family Medicine

## 2016-04-03 VITALS — BP 120/66 | HR 60 | Temp 98.3°F | Resp 16 | Wt 193.0 lb

## 2016-04-03 DIAGNOSIS — M47819 Spondylosis without myelopathy or radiculopathy, site unspecified: Secondary | ICD-10-CM

## 2016-04-03 DIAGNOSIS — M5440 Lumbago with sciatica, unspecified side: Secondary | ICD-10-CM

## 2016-04-03 DIAGNOSIS — M4690 Unspecified inflammatory spondylopathy, site unspecified: Secondary | ICD-10-CM | POA: Diagnosis not present

## 2016-04-03 DIAGNOSIS — M545 Low back pain, unspecified: Secondary | ICD-10-CM

## 2016-04-03 MED ORDER — CYCLOBENZAPRINE HCL 5 MG PO TABS: 5.0000 mg | ORAL_TABLET | Freq: Three times a day (TID) | ORAL | 1 refills | Status: DC | PRN

## 2016-04-03 MED ORDER — MELOXICAM 15 MG PO TABS: 15.0000 mg | ORAL_TABLET | Freq: Every day | ORAL | 0 refills | Status: DC

## 2016-04-03 NOTE — Progress Notes (Signed)
Patient: Luke Rojas Male    DOB: Jan 08, 1946   71 y.o.   MRN: [id number] Visit Date: 04/03/2016  Today's Provider: Vernie Murders, PA   Chief Complaint  Patient presents with  . Back Pain   Subjective:    HPI Patient here today c/o persistent back pain. Patient was last seen on 03/27/2016 and started on hydrocodone-acetaminophen, prednisone, and x-ray were ordered. X-Ray showed degenerative disease with arthritic spurring at multiple levels in lumbar spine. Patient advised he may need a referral to orthopedist/spine specialist if symptoms are not improving with oral medications. Patient reports his pain is unchanged, pt reports he took last dose of prednisone yesterday. Patient reports he is taking hydrocodone-acetaminophen every 6 hours and started taking Meloxicam 15 mg today. Patient reports mild pain control on medications.  Past Medical History:  Diagnosis Date  . Chronic kidney disease    kidney stones  . Coronary artery disease    2 stents   . GERD (gastroesophageal reflux disease)   . Hypercholesteremia   . Hypertension   . Myocardial infarction 2013      Past Surgical History:  Procedure Laterality Date  . CARDIAC CATHETERIZATION  2013   2 stents  . COLONOSCOPY  04/27/05  . COLONOSCOPY WITH PROPOFOL N/A 06/08/2015   Procedure: COLONOSCOPY WITH PROPOFOL;  Surgeon: Robert Bellow, MD;  Location: [facility] ENDOSCOPY;  Service: Endoscopy;  Laterality: N/A;  . EXTRACORPOREAL SHOCK WAVE LITHOTRIPSY Left 11/04/2014   has had 2 previous lithotripsies  . EXTRACORPOREAL SHOCK WAVE LITHOTRIPSY Left 03/24/2015   Procedure: EXTRACORPOREAL SHOCK WAVE LITHOTRIPSY (ESWL);  Surgeon: Royston Cowper, MD;  Location: [facility] ORS;  Service: Urology;  Laterality: Left;   Family History  Problem Relation Age of Onset  . Pulmonary embolism Mother   . Transient ischemic attack Mother   . Pancreatic cancer Father   . Hypertension Father   . Cirrhosis Brother     Allergies    Allergen Reactions  . Codeine Nausea Only     Current Outpatient Prescriptions:  .  aspirin 81 MG tablet, Take 81 mg by mouth daily., Disp: , Rfl:  .  atorvastatin (LIPITOR) 40 MG tablet, Take 1 tablet by mouth daily., Disp: , Rfl:  .  ciprofloxacin (CIPRO) 500 MG tablet, Take 1 tablet (500 mg total) by mouth 2 (two) times daily., Disp: 20 tablet, Rfl: 0 .  clopidogrel (PLAVIX) 75 MG tablet, , Disp: , Rfl:  .  HYDROcodone-acetaminophen (NORCO/VICODIN) 5-325 MG tablet, Take 1 tablet by mouth every 6 (six) hours as needed for moderate pain., Disp: 30 tablet, Rfl: 0 .  meloxicam (MOBIC) 15 MG tablet, Take 1 tablet (15 mg total) by mouth daily., Disp: 20 tablet, Rfl: 0 .  metoprolol (LOPRESSOR) 50 MG tablet, Take 1 tablet by mouth. Morning and night, Disp: , Rfl:  .  MULTIPLE VITAMIN PO, Take 1 tablet by mouth daily., Disp: , Rfl:  .  naproxen (NAPROSYN) 500 MG tablet, , Disp: , Rfl:  .  nitroGLYCERIN (NITROSTAT) 0.4 MG SL tablet, Place 1 tablet under the tongue. Reported on 03/24/2015, Disp: , Rfl:  .  Omega-3 Fatty Acids (FISH OIL) 1200 MG CAPS, Take 1 capsule by mouth 2 (two) times daily., Disp: , Rfl:  .  omeprazole (PRILOSEC) 20 MG capsule, , Disp: , Rfl:  .  phenazopyridine (PYRIDIUM) 200 MG tablet, Take 1 tablet (200 mg total) by mouth 3 (three) times daily as needed for pain., Disp: 12 tablet, Rfl: 0 .  ramipril (ALTACE) 10 MG capsule, Take 1 capsule by mouth daily., Disp: , Rfl:   Review of Systems  Constitutional: Negative.   Respiratory: Negative.   Cardiovascular: Negative.   Musculoskeletal: Positive for back pain.    Social History  Substance Use Topics  . Smoking status: Former Smoker    Packs/day: 1.00    Years: 30.00    Types: Cigarettes    Quit date: 03/05/1984  . Smokeless tobacco: Current User    Types: Chew  . Alcohol use No   Objective:   BP 120/66 (BP Location: Right Arm, Patient Position: Sitting, Cuff Size: Large)   Pulse 60   Temp 98.3 F (36.8 C)  (Oral)   Resp 16   Wt 193 lb (87.5 kg)   SpO2 97%   BMI 28.50 kg/m   Physical Exam  Constitutional: He is oriented to person, place, and time. He appears well-developed and well-nourished. No distress.  HENT:  Head: Normocephalic and atraumatic.  Right Ear: Hearing normal.  Left Ear: Hearing normal.  Nose: Nose normal.  Eyes: Conjunctivae and lids are normal. Right eye exhibits no discharge. Left eye exhibits no discharge. No scleral icterus.  Neck: Neck supple.  Cardiovascular: Normal rate and regular rhythm.   Pulmonary/Chest: Effort normal and breath sounds normal. No respiratory distress.  Abdominal: Soft. Bowel sounds are normal.  Musculoskeletal:  Still has tenderness in right lower back to palpate or transfer from sitting to standing. Denies significant discomfort sitting or standing. Able stand more erect today when walking. More comfortable partially flexed when walking. No numbness or muscle weakness. Spasms then trying to stand from seated position. SLR's 90 degrees with intermittent radiating pain to the right thigh.  Neurological: He is alert and oriented to person, place, and time.  Skin: Skin is intact. No lesion and no rash noted.  Psychiatric: He has a normal mood and affect. His speech is normal and behavior is normal. Thought content normal.      Assessment & Plan:     1. Low back pain with radiation Slight improvement. Does not feel the Norco is helping with pain. Will continue moist heat applications or ice packs, add Flexeril and schedule physical therapy evaluation and treatment. Recheck in 2 weeks. With history of kidney stones, may need follow up with urologist and further imaging. - cyclobenzaprine (FLEXERIL) 5 MG tablet; Take 1 tablet (5 mg total) by mouth 3 (three) times daily as needed for muscle spasms.  Dispense: 30 tablet; Refill: 1 - Ambulatory referral to Physical Therapy  2. 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 Some calcifications considered renal stones as previously documented. Denies hematuria or stone-type pain he had experienced in the past. Will add NSAID and recheck in 2 weeks. May need to recheck with urologist. - meloxicam (MOBIC) 15 MG tablet; Take 1 tablet (15 mg total) by mouth daily.  Dispense: 20 tablet; Refill: [place], PA  [facility]

## 2016-04-17 ENCOUNTER — Ambulatory Visit: Payer: Medicare Other | Attending: Family Medicine

## 2016-04-17 DIAGNOSIS — M5416 Radiculopathy, lumbar region: Secondary | ICD-10-CM | POA: Insufficient documentation

## 2016-04-17 DIAGNOSIS — R262 Difficulty in walking, not elsewhere classified: Secondary | ICD-10-CM | POA: Insufficient documentation

## 2016-04-17 DIAGNOSIS — M545 Low back pain: Secondary | ICD-10-CM | POA: Insufficient documentation

## 2016-04-17 NOTE — Therapy (Signed)
[place] [facility] [address] [place], [address] Phone: [phone]   Fax:  [phone]  Physical Therapy Evaluation  Patient Details  Name: Luke Rojas MRN: [id number] Date of Birth: 06/21/1945 Referring Provider: Vernie Murders, PA  Encounter Date: 04/17/2016      PT End of Session - 04/17/16 0805    Visit Number 1   Number of Visits 9   Date for PT Re-Evaluation 05/17/16   Authorization Type 1   Authorization Time Period of 10   PT Start Time 0806   PT Stop Time 0931   PT Time Calculation (min) 85 min   Activity Tolerance Patient tolerated treatment well   Behavior During Therapy [facility] for tasks assessed/performed      Past Medical History:  Diagnosis Date  . Chronic kidney disease    kidney stones  . Coronary artery disease    2 stents   . GERD (gastroesophageal reflux disease)   . Hypercholesteremia   . Hypertension   . Myocardial infarction 2013    Past Surgical History:  Procedure Laterality Date  . CARDIAC CATHETERIZATION  2013   2 stents  . COLONOSCOPY  04/27/05  . COLONOSCOPY WITH PROPOFOL N/A 06/08/2015   Procedure: COLONOSCOPY WITH PROPOFOL;  Surgeon: Robert Bellow, MD;  Location: [facility] ENDOSCOPY;  Service: Endoscopy;  Laterality: N/A;  . EXTRACORPOREAL SHOCK WAVE LITHOTRIPSY Left 11/04/2014   has had 2 previous lithotripsies  . EXTRACORPOREAL SHOCK WAVE LITHOTRIPSY Left 03/24/2015   Procedure: EXTRACORPOREAL SHOCK WAVE LITHOTRIPSY (ESWL);  Surgeon: Royston Cowper, MD;  Location: [facility] ORS;  Service: Urology;  Laterality: Left;    There were no vitals filed for this visit.       Subjective Assessment - 04/17/16 0811    Subjective Back pain:  0/10 currently (pt sitting on a chair), 10/10 at worst; and R LE pain: 0/10 currently (pt sitting) 10/10 at most    Pertinent History Low back pain with radiation. Pt states that his back bothered him around the time it snowed (4 weeks  ago, around Jan 18). Pt shoveled snow. The next day, pt was fine. Saturday (03/24/2016) morning, his back bothered him. Went to the hospital Sunday, had an x-ray for his hip which showed arthritis. Had a back x-ray (Tuesday) which revealed arthritis. Pain pills and muscle relaxers did not really help.  Had to stay bent over afterwards for about 3 weeks due to back pain.  Feeling better now. Pt also states that his R LE kind of gives way when he steps on his R LE.  Pt also states that his R lateral leg bothered him this morning. Prior to injury pt was able to ambulate independenly without back pain.  Had chiropractic treatment for his back 30 years ago which helped.    Patient Stated Goals Go to work (plumbing, retired but helping someone), walk better, climb ladders.    Currently in Pain? Yes   Pain Score 0-No pain  0/10 sitting   Pain Location Back  back and R LE to R leg, L5 dermatome   Pain Orientation Right   Pain Descriptors / Indicators Aching   Pain Type Acute pain   Pain Onset 1 to 4 weeks ago   Pain Frequency Occasional   Aggravating Factors  lying on his L side (causes sharp pain in R side), laying on his R side hurts but not as bad. standing up and taking a step.   Standing  for about  2 minutes, standing up straight.    Pain Relieving Factors sleeping on his back, propping his R foot onto a chair when standing, bending over.             [facility] PT Assessment - 04/17/16 0808      Assessment   Medical Diagnosis low back pain with radiation   Referring Provider Vernie Murders, PA   Onset Date/Surgical Date 03/24/16   Prior Therapy No known physical therapy for current condition     Precautions   Precaution Comments No known precautions     Restrictions   Other Position/Activity Restrictions No known restrictions     Balance Screen   Has the patient fallen in the past 6 months No   Has the patient had a decrease in activity level because of a fear of falling?  No   Is the  patient reluctant to leave their home because of a fear of falling?  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 Supine posture: L LE longer     AROM   Overall AROM Comments lumbar rotation performed in sitting.    Lumbar Flexion WFL with reproduction of R LE symptoms   Lumbar Extension limited with R lateral leg pain reproduction (worse than lumbar flexion)   limited lumbar and thoracic extension   Lumbar - Right Side Bend limited with R lateral leg pain reproduction   Lumbar - Left Side Bend limited, minimal to no pain   Lumbar - Right Rotation limited with R leg pain (with overpressure)   Lumbar - Left Rotation WFL, minimal to no symptoms     PROM   Overall PROM Comments S/L R hip extension: -14 degrees with reproduction of symptoms. Supine 90/90 hip position: ER WFL bilaterally, IR limited bilaterally R >L      Strength   Right Hip Flexion 5/5   Right Hip ABduction 4/5   Left Hip Flexion 4+/5   Left Hip ABduction --  Unable to perform secondary to pain in R S/L   Right Knee Flexion 4+/5  hamstring cramp   Right Knee Extension 5/5   Left Knee Flexion 4/5  (L lateral leg discomfort)   Left Knee Extension 5/5     Ambulation/Gait   Gait Comments antalgic, decreased stance R LE, forward flexed, R lateral lean during R LE stance phase      Objectives   There-ex  Directed patient with supine R mini hip flexion with knee bent 10x  Log rolling 1x from supine to  sit  Seated bilateral scapular retraction to promote thoracic extension 10x5 seconds  Seated thoracic extension over chair with foot on 3 inch step and Air Ex pad 10x5 seconds to promote gentle thoracic extension.   Standing glute max squeeze 10x5 seconds    Standing R hip extension gentle stretch (with glute max squeeze) with L foot on 1st step with bilateral UE assist and  slight forward flexed posture 10x5 seconds  Reviewed HEP. Pt demonstrated and verbalized understanding.    Improved exercise technique, movement at target joints, use of target muscles after mod verbal, visual, tactile cues.                    PT Education - 04/17/16 2036    Education provided Yes   Education Details ther-ex, HEP, plan of care   Person(s) Educated Patient   Methods Explanation;Demonstration;Tactile cues;Verbal cues;Handout   Comprehension Verbalized understanding;Returned demonstration             PT Long Term Goals - 04/17/16 0950      PT LONG TERM GOAL #1   Title Patient will have a decrease in back and R LE pain to 5/10 or less at worst to promote ability to ambulate and perform standing tasks.    Baseline 10/10 at worst (04/17/2016)   Time 4   Period Weeks   Status New     PT LONG TERM GOAL #2   Title Patient will improve R hip extension PROM to -8 degrees or less to promote ability to stand, walk perform standing tasks.    Baseline R hip extension PROM -14 degrees (04/17/2016)   Time 4   Period Weeks   Status New     PT LONG TERM GOAL #3   Title Patient will improve bilateral LE strength by at least 1/2 MMT grade to promote ability to perform functional tasks.    Time 4   Period Weeks   Status New     PT LONG TERM GOAL #4   Title Pt will report being able to increase standing tolerance to 8 min or more with overall decreased back pain to promote ability to perform functional tasks at home.    Baseline standing for about 2 minutes increases back pain (04/17/2016)    Time 4   Period Weeks   Status New               Plan - 04/17/16 0947    Clinical Impression Statement Patient is a 71 year old male who came to physical therapy secondary to low back pain with R LE radiating symptoms. He also demonstrates limited R hip extension ROM, limited bilateral hip IR R > L, limited thoracic extension, altered gait pattern and posture, glute weakness, and difficulty performing functional tasks such as walking, and difficulty tolerating positions such as standing. Patient will benefit from skilled physical therapy services to address the aforementioned deficits.     Rehab Potential Good   Clinical Impairments Affecting Rehab Potential pain   PT Frequency 2x / week   PT Duration 4 weeks   PT Treatment/Interventions Neuromuscular re-education;Therapeutic activities;Therapeutic exercise;Manual techniques;Dry needling;Patient/family education;Gait training;Ultrasound;Electrical Stimulation;Iontophoresis '4mg'$ /ml Dexamethasone;Traction;Aquatic Therapy  traction if appropriate   PT Next Visit Plan hip extension, thoracic extension, scapular strengthening, glute strengthening   Consulted and Agree with Plan of Care Patient;Family member/caregiver   Family Member Consulted wife      Patient will benefit from skilled therapeutic intervention in order to improve the following deficits and impairments:  Pain, Postural dysfunction, Improper body mechanics, Difficulty walking, Decreased strength, Decreased range of motion  Visit Diagnosis: Acute low back pain, unspecified back pain laterality, with sciatica presence unspecified - Plan: PT plan of care cert/re-cert  Radiculopathy, lumbar region - Plan: PT plan of care cert/re-cert  Difficulty in walking, not elsewhere classified - Plan: PT plan of care cert/re-cert  G-Codes - 04/17/16 1232    Functional Assessment Tool Used clinical presentation, patient interview   Functional Limitation Mobility: Walking and  moving around   Mobility: Walking and Moving Around Current Status [phone]) At least 40 percent but less than 60 percent impaired, limited or restricted   Mobility: Walking and Moving Around Goal Status [phone]) At least 20 percent but less than 40 percent impaired, limited or restricted       Problem List Patient Active Problem List   Diagnosis Date Noted  . Encounter for screening colonoscopy 05/04/2015  . Arthritis 10/08/2014  . H/O acute myocardial infarction 10/08/2014  . Acid reflux 10/08/2014  . HLD (hyperlipidemia) 10/08/2014  . BP (high blood pressure) 10/08/2014  . 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 Benign neoplasm of skin 07/19/2006  . Essential (primary) hypertension 11/15/2005  . Decreased potassium in the blood 11/15/2005   Joneen Boers PT, DPT   04/17/2016, 8:50 PM   [place] [facility] [address] [place], [address] Phone: [phone]   Fax:  [phone]  Name: Luke Rojas MRN: [id number] Date of Birth: 1945/06/22

## 2016-04-17 NOTE — Patient Instructions (Signed)
     Scapular Retraction (perform in sitting)   With arms at sides, pinch shoulder blades together.  Hold for 5 seconds.  Repeat __10__ times per set. Do __3__ sets per session.     Copyright  VHI. All rights reserved.    

## 2016-04-18 ENCOUNTER — Emergency Department: Payer: Medicare Other

## 2016-04-18 ENCOUNTER — Encounter: Payer: Self-pay | Admitting: Emergency Medicine

## 2016-04-18 ENCOUNTER — Emergency Department
Admission: EM | Admit: 2016-04-18 | Discharge: 2016-04-18 | Disposition: A | Discharge status: Home / Self Care | Payer: Medicare Other | Attending: Emergency Medicine | Admitting: Emergency Medicine

## 2016-04-18 DIAGNOSIS — I2510 Atherosclerotic heart disease of native coronary artery without angina pectoris: Secondary | ICD-10-CM | POA: Insufficient documentation

## 2016-04-18 DIAGNOSIS — N189 Chronic kidney disease, unspecified: Secondary | ICD-10-CM | POA: Diagnosis not present

## 2016-04-18 DIAGNOSIS — Z7982 Long term (current) use of aspirin: Secondary | ICD-10-CM | POA: Diagnosis not present

## 2016-04-18 DIAGNOSIS — F17220 Nicotine dependence, chewing tobacco, uncomplicated: Secondary | ICD-10-CM | POA: Insufficient documentation

## 2016-04-18 DIAGNOSIS — R109 Unspecified abdominal pain: Secondary | ICD-10-CM | POA: Diagnosis present

## 2016-04-18 DIAGNOSIS — N200 Calculus of kidney: Secondary | ICD-10-CM | POA: Insufficient documentation

## 2016-04-18 DIAGNOSIS — Z79899 Other long term (current) drug therapy: Secondary | ICD-10-CM | POA: Diagnosis not present

## 2016-04-18 DIAGNOSIS — I129 Hypertensive chronic kidney disease with stage 1 through stage 4 chronic kidney disease, or unspecified chronic kidney disease: Secondary | ICD-10-CM | POA: Diagnosis not present

## 2016-04-18 LAB — URINALYSIS, COMPLETE (UACMP) WITH MICROSCOPIC
BACTERIA UA: NONE SEEN
BILIRUBIN URINE: NEGATIVE
Glucose, UA: NEGATIVE mg/dL
KETONES UR: NEGATIVE mg/dL
Nitrite: NEGATIVE
PROTEIN: NEGATIVE mg/dL
SQUAMOUS EPITHELIAL / LPF: NONE SEEN
Specific Gravity, Urine: 1.012 (ref 1.005–1.030)
pH: 7 (ref 5.0–8.0)

## 2016-04-18 LAB — BASIC METABOLIC PANEL
Anion gap: 8 (ref 5–15)
BUN: 20 mg/dL (ref 6–20)
CHLORIDE: 102 mmol/L (ref 101–111)
CO2: 27 mmol/L (ref 22–32)
CREATININE: 1.4 mg/dL — AB (ref 0.61–1.24)
Calcium: 9.2 mg/dL (ref 8.9–10.3)
GFR calc non Af Amer: 49 mL/min — ABNORMAL LOW (ref >60)
GFR, EST AFRICAN AMERICAN: 57 mL/min — AB (ref >60)
Glucose, Bld: 151 mg/dL — ABNORMAL HIGH (ref 65–99)
POTASSIUM: 4.2 mmol/L (ref 3.5–5.1)
Sodium: 137 mmol/L (ref 135–145)

## 2016-04-18 LAB — CBC
HEMATOCRIT: 44.6 % (ref 40.0–52.0)
HEMOGLOBIN: 15.3 g/dL (ref 13.0–18.0)
MCH: 29.7 pg (ref 26.0–34.0)
MCHC: 34.2 g/dL (ref 32.0–36.0)
MCV: 86.8 fL (ref 80.0–100.0)
PLATELETS: 152 10*3/uL (ref 150–440)
RBC: 5.14 MIL/uL (ref 4.40–5.90)
RDW: 14.8 % — ABNORMAL HIGH (ref 11.5–14.5)
WBC: 10.6 10*3/uL (ref 3.8–10.6)

## 2016-04-18 MED ORDER — TAMSULOSIN HCL 0.4 MG PO CAPS: 0.4000 mg | ORAL_CAPSULE | Freq: Every day | ORAL | 0 refills | Status: DC

## 2016-04-18 MED ORDER — OXYCODONE-ACETAMINOPHEN 10-325 MG PO TABS: 1.0000 | ORAL_TABLET | Freq: Four times a day (QID) | ORAL | 0 refills | Status: DC | PRN

## 2016-04-18 MED ORDER — OXYCODONE-ACETAMINOPHEN 5-325 MG PO TABS
1.0000 | ORAL_TABLET | Freq: Once | ORAL | Status: AC
  Administered 2016-04-18: 1 via ORAL
  Filled 2016-04-18: qty 1

## 2016-04-18 MED ORDER — ONDANSETRON HCL 4 MG PO TABS: 4.0000 mg | ORAL_TABLET | Freq: Every day | ORAL | 1 refills | Status: AC | PRN

## 2016-04-18 NOTE — ED Provider Notes (Signed)
[facility] Emergency Department Provider Note   First MD Initiated Contact with Patient 04/18/16 0411     (approximate)  I have reviewed the triage vital signs and the nursing notes.   HISTORY  Chief Complaint Flank Pain    HPI Luke Rojas is a 71 y.o. male presents with acute onset of right flank pain with onset tonight. Patient states that his pain is 10 out of 10 at home patient has no pain at this time. Patient denies any dysuria or does admits to hematuria patient is afebrile temperature 90.8.   Past Medical History:  Diagnosis Date  . Chronic kidney disease    kidney stones  . Coronary artery disease    2 stents   . GERD (gastroesophageal reflux disease)   . Hypercholesteremia   . Hypertension   . Myocardial infarction 2013    Patient Active Problem List   Diagnosis Date Noted  . Encounter for screening colonoscopy 05/04/2015  . Arthritis 10/08/2014  . H/O acute myocardial infarction 10/08/2014  . Acid reflux 10/08/2014  . HLD (hyperlipidemia) 10/08/2014  . BP (high blood pressure) 10/08/2014  . 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 Benign neoplasm of skin 07/19/2006  . Essential (primary) hypertension 11/15/2005  . Decreased potassium in the blood 11/15/2005    Past Surgical History:  Procedure Laterality Date  . CARDIAC CATHETERIZATION  2013   2 stents  . COLONOSCOPY  04/27/05  . COLONOSCOPY WITH PROPOFOL N/A 06/08/2015   Procedure: COLONOSCOPY WITH PROPOFOL;  Surgeon: Robert Bellow, MD;  Location: [facility] ENDOSCOPY;  Service: Endoscopy;  Laterality: N/A;  . EXTRACORPOREAL SHOCK WAVE LITHOTRIPSY Left 11/04/2014   has had 2 previous lithotripsies  . EXTRACORPOREAL SHOCK WAVE LITHOTRIPSY Left 03/24/2015   Procedure: EXTRACORPOREAL SHOCK WAVE LITHOTRIPSY (ESWL);  Surgeon: Royston Cowper, MD;  Location: [facility] ORS;  Service: Urology;  Laterality: Left;    Prior to Admission medications   Medication  Sig Start Date End Date Taking? Authorizing Provider  aspirin 81 MG tablet Take 81 mg by mouth daily.    Historical Provider, MD  atorvastatin (LIPITOR) 40 MG tablet Take 1 tablet by mouth daily. 09/29/11   Historical Provider, MD  ciprofloxacin (CIPRO) 500 MG tablet Take 1 tablet (500 mg total) by mouth 2 (two) times daily. 02/21/16   Vickki Muff Chrismon, PA  clopidogrel (PLAVIX) 75 MG tablet  01/12/15   Historical Provider, MD  cyclobenzaprine (FLEXERIL) 5 MG tablet Take 1 tablet (5 mg total) by mouth 3 (three) times daily as needed for muscle spasms. 04/03/16   Vickki Muff Chrismon, PA  HYDROcodone-acetaminophen (NORCO/VICODIN) 5-325 MG tablet Take 1 tablet by mouth every 6 (six) hours as needed for moderate pain. 03/27/16   Vickki Muff Chrismon, PA  meloxicam (MOBIC) 15 MG tablet Take 1 tablet (15 mg total) by mouth daily. 04/03/16 04/03/17  Vickki Muff Chrismon, PA  metoprolol (LOPRESSOR) 50 MG tablet Take 1 tablet by mouth. Morning and night 07/08/14   Historical Provider, MD  MULTIPLE VITAMIN PO Take 1 tablet by mouth daily.    Historical Provider, MD  naproxen (NAPROSYN) 500 MG tablet  03/20/16   Historical Provider, MD  nitroGLYCERIN (NITROSTAT) 0.4 MG SL tablet Place 1 tablet under the tongue. Reported on 03/24/2015 09/29/11   Historical Provider, MD  Omega-3 Fatty Acids (FISH OIL) 1200 MG CAPS Take 1 capsule by mouth 2 (two) times daily.    Historical Provider, MD  omeprazole (PRILOSEC) 20 MG capsule  01/26/15   Historical Provider, MD  ondansetron (ZOFRAN) 4 MG tablet Take 1 tablet (4 mg total) by mouth daily as needed for nausea or vomiting. 04/18/16 04/18/17  Gregor Hams, MD  oxyCODONE-acetaminophen (PERCOCET) 10-325 MG tablet Take 1 tablet by mouth every 6 (six) hours as needed for pain. 04/18/16 04/18/17  Gregor Hams, MD  phenazopyridine (PYRIDIUM) 200 MG tablet Take 1 tablet (200 mg total) by mouth 3 (three) times daily as needed for pain. 02/21/16   Vickki Muff Chrismon, PA  ramipril (ALTACE) 10 MG  capsule Take 1 capsule by mouth daily. 07/08/14   Historical Provider, MD  tamsulosin (FLOMAX) 0.4 MG CAPS capsule Take 1 capsule (0.4 mg total) by mouth daily after breakfast. 04/18/16   Gregor Hams, MD    Allergies Codeine  Family History  Problem Relation Age of Onset  . Pulmonary embolism Mother   . Transient ischemic attack Mother   . Pancreatic cancer Father   . Hypertension Father   . Cirrhosis Brother     Social History Social History  Substance Use Topics  . Smoking status: Former Smoker    Packs/day: 1.00    Years: 30.00    Types: Cigarettes    Quit date: 03/05/1984  . Smokeless tobacco: Current User    Types: Chew  . Alcohol use No    Review of Systems Constitutional: No fever/chills Eyes: No visual changes. ENT: No sore throat. Cardiovascular: Denies chest pain. Respiratory: Denies shortness of breath. Gastrointestinal: As negative for right flank  No nausea, no vomiting.  No diarrhea.  No constipation. Genitourinary: Negative for dysuria. Musculoskeletal: Negative for back pain. Skin: Negative for rash. Neurological: Negative for headaches, focal weakness or numbness.  10-point ROS otherwise negative.  ____________________________________________   PHYSICAL EXAM:  VITAL SIGNS: ED Triage Vitals  Enc Vitals Group     BP 04/18/16 0108 (!) 186/86     Pulse Rate 04/18/16 0108 72     Resp 04/18/16 0108 18     Temp 04/18/16 0108 98 F (36.7 C)     Temp Source 04/18/16 0108 Oral     SpO2 04/18/16 0108 99 %     Weight 04/18/16 0107 193 lb (87.5 kg)     Height 04/18/16 0107 '5\' 9"'$  (1.753 m)     Head Circumference --      Peak Flow --      Pain Score 04/18/16 0107 10     Pain Loc --      Pain Edu? --      Excl. in [place]? --    Constitutional: Alert and oriented. Well appearing and in no acute distress. Eyes: Conjunctivae are normal. PERRL. EOMI. Head: Atraumatic. Ears:  Healthy appearing ear canals and TMs bilaterally Nose: No  congestion/rhinnorhea. Mouth/Throat: Mucous membranes are moist. Neck: No stridor.  No meningeal signs.  Cardiovascular: Normal rate, regular rhythm. Good peripheral circulation. Grossly normal heart sounds. Respiratory: Normal respiratory effort.  No retractions. Lungs CTAB. Gastrointestinal: Soft and nontender. No distention.  Musculoskeletal: No lower extremity tenderness nor edema. No gross deformities of extremities. Neurologic:  Normal speech and language. No gross focal neurologic deficits are appreciated.  Skin:  Skin is warm, dry and intact. No rash noted.   ____________________________________________   LABS (all labs ordered are listed, but only abnormal results are displayed)  Labs Reviewed  BASIC METABOLIC PANEL - Abnormal; Notable for the following:       Result Value   Glucose, Bld 151 (*)  Creatinine, Ser 1.40 (*)    GFR calc non Af Amer 49 (*)    GFR calc Af Amer 57 (*)    All other components within normal limits  CBC - Abnormal; Notable for the following:    RDW 14.8 (*)    All other components within normal limits  URINALYSIS, COMPLETE (UACMP) WITH MICROSCOPIC - Abnormal; Notable for the following:    Color, Urine YELLOW (*)    APPearance CLEAR (*)    Hgb urine dipstick LARGE (*)    Leukocytes, UA TRACE (*)    All other components within normal limits    RADIOLOGY I, Varnado N Delos Klich, personally viewed and evaluated these images (plain radiographs) as part of my medical decision making, as well as reviewing the written report by the radiologist.  Ct Renal Stone Study  Result Date: 04/18/2016 CLINICAL DATA:  Right flank pain. Previous history of several kidney stones. Patient has past all but 2 of them. Right flank and back pain with blood in urine this morning. EXAM: CT ABDOMEN AND PELVIS WITHOUT CONTRAST TECHNIQUE: Multidetector CT imaging of the abdomen and pelvis was performed following the standard protocol without IV contrast. COMPARISON:  10/02/2011  FINDINGS: Lower chest: Mild dependent changes in the lung bases. Coronary artery and aortic calcifications. Hepatobiliary: No focal liver abnormality is seen. No gallstones, gallbladder wall thickening, or biliary dilatation. Pancreas: Unremarkable. No pancreatic ductal dilatation or surrounding inflammatory changes. Spleen: Normal in size without focal abnormality. Adrenals/Urinary Tract: No adrenal gland nodules. Large cyst in the upper pole right kidney, unchanged. 3 mm stone in the proximal right ureter at the level of L4. There is hydronephrosis and ureterectasis above the stone with distal ureteral decompression. Stranding around the right kidney and ureter. Multiple additional intrarenal stones on the right, with at least 6 stones identified. Largest is in the lower pole and measures 5 mm in diameter. Multiple punctate size stones in the left kidney. No left hydronephrosis or hydroureter. Bladder wall is not thickened and no filling defects are demonstrated in the bladder. Stomach/Bowel: Stomach is within normal limits. Appendix appears normal. No evidence of bowel wall thickening, distention, or inflammatory changes. Vascular/Lymphatic: Aortic atherosclerosis. No enlarged abdominal or pelvic lymph nodes. Reproductive: Prostate gland is enlarged, measuring 5 cm diameter. Prostate calcifications are present. Other: No free air or free fluid in the abdomen. Abdominal wall musculature appears intact. Musculoskeletal: Degenerative changes in the spine. No destructive bone lesions. IMPRESSION: 3 mm stone in the proximal right ureter with moderate proximal obstruction. Additional nonobstructing intrarenal stones bilaterally. Electronically Signed   By: Lucienne Capers M.D.   On: 04/18/2016 01:55      Procedures    INITIAL IMPRESSION / ASSESSMENT AND PLAN / ED COURSE  Pertinent labs & imaging results that were available during my care of the patient were reviewed by me and considered in my medical  decision making (see chart for details).   Patient has no pain at this time we'll prescribe Percocet and Zofran and Flomax for home. Patient states that he seen Dr. Yves Dill in the past with his previous kidney stones and a such will be referred to.     ____________________________________________  FINAL CLINICAL IMPRESSION(S) / ED DIAGNOSES  Final diagnoses:  Right kidney stone     MEDICATIONS GIVEN DURING THIS VISIT:  Medications - No data to display   NEW OUTPATIENT MEDICATIONS STARTED DURING THIS VISIT:  New Prescriptions   ONDANSETRON (ZOFRAN) 4 MG TABLET    Take 1 tablet (  4 mg total) by mouth daily as needed for nausea or vomiting.   OXYCODONE-ACETAMINOPHEN (PERCOCET) 10-325 MG TABLET    Take 1 tablet by mouth every 6 (six) hours as needed for pain.   TAMSULOSIN (FLOMAX) 0.4 MG CAPS CAPSULE    Take 1 capsule (0.4 mg total) by mouth daily after breakfast.    Modified Medications   No medications on file    Discontinued Medications   No medications on file     Note:  This document was prepared using Dragon voice recognition software and may include unintentional dictation errors.    Gregor Hams, MD 04/18/16 [phone]

## 2016-04-18 NOTE — ED Triage Notes (Signed)
Patient ambulatory to triage with steady gait, without difficulty or distress noted; pt reports right flank pain and noted hematuria tonight; st hx kidney stone

## 2016-04-19 ENCOUNTER — Ambulatory Visit: Payer: Medicare Other

## 2016-04-24 ENCOUNTER — Ambulatory Visit: Payer: Medicare Other

## 2016-04-24 DIAGNOSIS — M5416 Radiculopathy, lumbar region: Secondary | ICD-10-CM

## 2016-04-24 DIAGNOSIS — R262 Difficulty in walking, not elsewhere classified: Secondary | ICD-10-CM

## 2016-04-24 DIAGNOSIS — M545 Low back pain: Secondary | ICD-10-CM | POA: Diagnosis not present

## 2016-04-24 NOTE — Therapy (Signed)
[facility] [address] [place], [address] Phone: [phone]   Fax:  [phone]  Physical Therapy Treatment  Patient Details  Name: Luke Rojas MRN: [id number] Date of Birth: 03-Jun-1945 Referring Provider: Vernie Murders, PA  Encounter Date: 04/24/2016      PT End of Session - 04/24/16 0818    Visit Number 2   Number of Visits 9   Date for PT Re-Evaluation 05/17/16   Authorization Type 2   Authorization Time Period of 10   PT Start Time 0818   PT Stop Time 0900   PT Time Calculation (min) 42 min   Activity Tolerance Patient tolerated treatment well   Behavior During Therapy [facility] for tasks assessed/performed      Past Medical History:  Diagnosis Date  . Chronic kidney disease    kidney stones  . Coronary artery disease    2 stents   . GERD (gastroesophageal reflux disease)   . Hypercholesteremia   . Hypertension   . Myocardial infarction 2013    Past Surgical History:  Procedure Laterality Date  . CARDIAC CATHETERIZATION  2013   2 stents  . COLONOSCOPY  04/27/05  . COLONOSCOPY WITH PROPOFOL N/A 06/08/2015   Procedure: COLONOSCOPY WITH PROPOFOL;  Surgeon: Robert Bellow, MD;  Location: [facility] ENDOSCOPY;  Service: Endoscopy;  Laterality: N/A;  . EXTRACORPOREAL SHOCK WAVE LITHOTRIPSY Left 11/04/2014   has had 2 previous lithotripsies  . EXTRACORPOREAL SHOCK WAVE LITHOTRIPSY Left 03/24/2015   Procedure: EXTRACORPOREAL SHOCK WAVE LITHOTRIPSY (ESWL);  Surgeon: Royston Cowper, MD;  Location: [facility] ORS;  Service: Urology;  Laterality: Left;    There were no vitals filed for this visit.      Subjective Assessment - 04/24/16 0821    Subjective Pt states going to his kidney doctor in the morning. Other than that, he is in good shape. Back seems to be doing better. Can bend over, walk and get the papers.  No pain currently.    Pertinent History Low back pain with radiation. Pt states that his  back bothered him around the time it snowed (4 weeks ago, around Jan 18). Pt shoveled snow. The next day, pt was fine. Saturday (03/24/2016) morning, his back bothered him. Went to the hospital Sunday, had an x-ray for his hip which showed arthritis. Had a back x-ray (Tuesday) which revealed arthritis. Pain pills and muscle relaxers did not really help.  Had to stay bent over afterwards for about 3 weeks due to back pain.  Feeling better now. Pt also states that his R LE kind of gives way when he steps on his R LE.  Pt also states that his R lateral leg bothered him this morning. Prior to injury pt was able to ambulate independenly without back pain.  Had chiropractic treatment for his back 30 years ago which helped.    Patient Stated Goals Go to work (plumbing, retired but helping someone), walk better, climb ladders.    Currently in Pain? No/denies   Pain Score 0-No pain   Pain Onset 1 to 4 weeks ago                                 PT Education - 04/24/16 0824    Education provided Yes   Education Details ther-ex   [employer]) Educated Patient   Methods Explanation;Demonstration;Tactile cues;Verbal cues   Comprehension Verbalized understanding;Returned demonstration  Objectives  There-ex  Directed patient with seated bilateral scapular retraction to promote thoracic extension 10x5 seconds  Vitals obtained. Blood pressure L arm sitting, mechanically taken: 150/77, HR 62  Supine R leg straight, L knee in hooklying: glute max squeeze 3x5 with 5 second holds to promote R hip extension  Log rolling sit <> supine 3x. Max cues for technique. Minimal to no back pain with transfer afterwards.    Seated thoracic extension over chair with foot on 3 inch step and Air Ex pad 10x5 seconds to promote gentle thoracic extension.   Standing glute max squeeze 10x5 seconds for 2 sets  Standing bilateral shoulder extension resisting yellow band 6x. R LE symptoms which  eases with rest  Sitting bilateral shoulder extension resisting yellow band 10x2. No R LE symptoms.    Improved exercise technique, movement at target joints, use of target muscles after mod verbal, visual, tactile cues.      Back pain initially with supine <> sit which decreased with log rolling technique. Continued with gentle thoracic extension and R hip flexor flexibility and scapular and glute max strengthening exercises to decrease pressure to low back.  Pt tolerated session well without aggravation of symptoms          PT Long Term Goals - 04/17/16 0950      PT LONG TERM GOAL #1   Title Patient will have a decrease in back and R LE pain to 5/10 or less at worst to promote ability to ambulate and perform standing tasks.    Baseline 10/10 at worst (04/17/2016)   Time 4   Period Weeks   Status New     PT LONG TERM GOAL #2   Title Patient will improve R hip extension PROM to -8 degrees or less to promote ability to stand, walk perform standing tasks.    Baseline R hip extension PROM -14 degrees (04/17/2016)   Time 4   Period Weeks   Status New     PT LONG TERM GOAL #3   Title Patient will improve bilateral LE strength by at least 1/2 MMT grade to promote ability to perform functional tasks.    Time 4   Period Weeks   Status New     PT LONG TERM GOAL #4   Title Pt will report being able to increase standing tolerance to 8 min or more with overall decreased back pain to promote ability to perform functional tasks at home.    Baseline standing for about 2 minutes increases back pain (04/17/2016)   Time 4   Period Weeks   Status New               Plan - 04/24/16 8416    Clinical Impression Statement Back pain initially with supine <> sit which decreased with log rolling technique. Continued with gentle thoracic extension and R hip flexor flexibility and scapular and glute max strengthening exercises to decrease pressure to low back.  Pt tolerated session well without  aggravation of symptoms    Rehab Potential Good   Clinical Impairments Affecting Rehab Potential pain   PT Frequency 2x / week   PT Duration 4 weeks   PT Treatment/Interventions Neuromuscular re-education;Therapeutic activities;Therapeutic exercise;Manual techniques;Dry needling;Patient/family education;Gait training;Ultrasound;Electrical Stimulation;Iontophoresis '4mg'$ /ml Dexamethasone;Traction;Aquatic Therapy  traction if appropriate   PT Next Visit Plan hip extension, thoracic extension, scapular strengthening, glute strengthening   Consulted and Agree with Plan of Care Patient;Family member/caregiver   Family Member Consulted wife  Patient will benefit from skilled therapeutic intervention in order to improve the following deficits and impairments:  Pain, Postural dysfunction, Improper body mechanics, Difficulty walking, Decreased strength, Decreased range of motion  Visit Diagnosis: Acute low back pain, unspecified back pain laterality, with sciatica presence unspecified  Radiculopathy, lumbar region  Difficulty in walking, not elsewhere classified     Problem List Patient Active Problem List   Diagnosis Date Noted  . Encounter for screening colonoscopy 05/04/2015  . Arthritis 10/08/2014  . H/O acute myocardial infarction 10/08/2014  . Acid reflux 10/08/2014  . HLD (hyperlipidemia) 10/08/2014  . BP (high blood pressure) 10/08/2014  . 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 Benign neoplasm of skin 07/19/2006  . Essential (primary) hypertension 11/15/2005  . Decreased potassium in the blood 11/15/2005    Joneen Boers PT, DPT   04/24/2016, 9:09 AM  [place] [address], [place], [address] Phone: [phone]   Fax:  [phone]  Name: Luke Rojas MRN: [id number] Date of Birth: 09/03/1945

## 2016-04-24 NOTE — Patient Instructions (Signed)
  Standing and holding onto something sturdy for support:    Squeeze your rear end muscles together for 5 seconds.   Repeat 10 times.     Perform 3 sets daily.

## 2016-05-01 ENCOUNTER — Ambulatory Visit: Payer: Medicare Other

## 2016-05-03 ENCOUNTER — Ambulatory Visit: Payer: Medicare Other

## 2016-05-08 ENCOUNTER — Ambulatory Visit: Payer: Medicare Other

## 2016-05-08 ENCOUNTER — Other Ambulatory Visit: Payer: Self-pay | Admitting: Family Medicine

## 2016-05-08 DIAGNOSIS — M47819 Spondylosis without myelopathy or radiculopathy, site unspecified: Secondary | ICD-10-CM

## 2016-05-10 ENCOUNTER — Ambulatory Visit: Payer: Medicare Other | Attending: Family Medicine

## 2016-05-10 DIAGNOSIS — R262 Difficulty in walking, not elsewhere classified: Secondary | ICD-10-CM | POA: Diagnosis present

## 2016-05-10 DIAGNOSIS — M545 Low back pain: Secondary | ICD-10-CM | POA: Insufficient documentation

## 2016-05-10 DIAGNOSIS — M5416 Radiculopathy, lumbar region: Secondary | ICD-10-CM | POA: Diagnosis present

## 2016-05-10 NOTE — Therapy (Signed)
[facility] [address], [place], [address] Phone: [phone]   Fax:  [phone]  Physical Therapy Treatment  Patient Details  Name: Luke Rojas MRN: [id number] Date of Birth: [birth year] Referring Provider: Vernie Murders, PA  Encounter Date: 05/10/2016      PT End of Session - 05/10/16 0840    Visit Number 3   Number of Visits 9   Date for PT Re-Evaluation 05/17/16   Authorization Type 3   Authorization Time Period of 10   PT Start Time 0840   PT Stop Time 0924   PT Time Calculation (min) 44 min   Activity Tolerance Patient tolerated treatment well   Behavior During Therapy [facility] for tasks assessed/performed      Past Medical History:  Diagnosis Date  . Chronic kidney disease    kidney stones  . Coronary artery disease    2 stents   . GERD (gastroesophageal reflux disease)   . Hypercholesteremia   . Hypertension   . Myocardial infarction 2013    Past Surgical History:  Procedure Laterality Date  . CARDIAC CATHETERIZATION  2013   2 stents  . COLONOSCOPY  04/27/05  . COLONOSCOPY WITH PROPOFOL N/A 06/08/2015   Procedure: COLONOSCOPY WITH PROPOFOL;  Surgeon: Robert Bellow, MD;  Location: [facility] ENDOSCOPY;  Service: Endoscopy;  Laterality: N/A;  . EXTRACORPOREAL SHOCK WAVE LITHOTRIPSY Left 11/04/2014   has had 2 previous lithotripsies  . EXTRACORPOREAL SHOCK WAVE LITHOTRIPSY Left 03/24/2015   Procedure: EXTRACORPOREAL SHOCK WAVE LITHOTRIPSY (ESWL);  Surgeon: Royston Cowper, MD;  Location: [facility] ORS;  Service: Urology;  Laterality: Left;    There were no vitals filed for this visit.      Subjective Assessment - 05/10/16 0842    Subjective Pt was wearing a catheter for a week because he could not pee. Had the catheter removed last week. Pt urinated a lot and a kidney stone came out. Still has 2 kidney stones.  Going back to his kidney doctor in 2 weeks to do a biopsy on his prostate.   Back feels fine. Has not problem with it. Just feels week. Has not done much exercise. 1-2/10 R LE pain at worst, 0/10 back pain at most for the past 7 days.  Symptoms bothered him a little bit after last session but it worked itself out.    Pertinent History Low back pain with radiation. Pt states that his back bothered him around the time it snowed (4 weeks ago, around Jan 18). Pt shoveled snow. The next day, pt was fine. Saturday (03/24/2016) morning, his back bothered him. Went to the hospital Sunday, had an x-ray for his hip which showed arthritis. Had a back x-ray (Tuesday) which revealed arthritis. Pain pills and muscle relaxers did not really help.  Had to stay bent over afterwards for about 3 weeks due to back pain.  Feeling better now. Pt also states that his R LE kind of gives way when he steps on his R LE.  Pt also states that his R lateral leg bothered him this morning. Prior to injury pt was able to ambulate independenly without back pain.  Had chiropractic treatment for his back 30 years ago which helped.    Patient Stated Goals Go to work (plumbing, retired but helping someone), walk better, climb ladders.    Currently in Pain? No/denies   Pain Score 0-No pain   Pain Onset 1 to 4 weeks ago  PT Education - 05/10/16 0858    Education provided Yes   Education Details ther-ex. HEP   Person(s) Educated Patient   Methods Explanation;Demonstration;Tactile cues;Verbal cues;Handout   Comprehension Verbalized understanding;Returned demonstration        Objectives  There-ex  Directed patient with seated thoracic extension over chair 10x5 seconds for 2 sets  Vitals obtained. Blood pressure L arm sitting, mechanically taken: 156/70, HR 68   Standing glute max squeeze 10x5 seconds for 2 sets  Standing bilateral shoulder extension with bilateral scapular retraction and glute max squeeze to promote thoracic extension, hip  extension, and decrease low back pressure 10x2 with 5 second holds.  Pt education on thoracic extension, hip extension to help decrease low back and R LE symptoms.    Reviewed HEP. Pt demonstrated and verbalized understanding.   TRX mini squats 5x3   Improved exercise technique, movement at target joints, use of target muscles after mod verbal, visual, tactile cues.      Pt able to perform all standing exercises today without complain of back or R LE pain. Continued working on thoracic and hip extension to decrease low back extension pressure. Patient making good progress towards pain goals.              PT Long Term Goals - 04/17/16 0950      PT LONG TERM GOAL #1   Title Patient will have a decrease in back and R LE pain to 5/10 or less at worst to promote ability to ambulate and perform standing tasks.    Baseline 10/10 at worst (04/17/2016)   Time 4   Period Weeks   Status New     PT LONG TERM GOAL #2   Title Patient will improve R hip extension PROM to -8 degrees or less to promote ability to stand, walk perform standing tasks.    Baseline R hip extension PROM -14 degrees (04/17/2016)   Time 4   Period Weeks   Status New     PT LONG TERM GOAL #3   Title Patient will improve bilateral LE strength by at least 1/2 MMT grade to promote ability to perform functional tasks.    Time 4   Period Weeks   Status New     PT LONG TERM GOAL #4   Title Pt will report being able to increase standing tolerance to 8 min or more with overall decreased back pain to promote ability to perform functional tasks at home.    Baseline standing for about 2 minutes increases back pain (04/17/2016)   Time 4   Period Weeks   Status New               Plan - 05/10/16 9381    Clinical Impression Statement Pt able to perform all standing exercises today without complain of back or R LE pain. Continued working on thoracic and hip extension to decrease low back extension pressure.  Patient making good progress towards pain goals.    Rehab Potential Good   Clinical Impairments Affecting Rehab Potential pain   PT Frequency 2x / week   PT Duration 4 weeks   PT Treatment/Interventions Neuromuscular re-education;Therapeutic activities;Therapeutic exercise;Manual techniques;Dry needling;Patient/family education;Gait training;Ultrasound;Electrical Stimulation;Iontophoresis '4mg'$ /ml Dexamethasone;Traction;Aquatic Therapy  traction if appropriate   PT Next Visit Plan hip extension, thoracic extension, scapular strengthening, glute strengthening   Consulted and Agree with Plan of Care Patient;Family member/caregiver   Family Member Consulted wife      Patient will benefit from skilled therapeutic intervention  in order to improve the following deficits and impairments:  Pain, Postural dysfunction, Improper body mechanics, Difficulty walking, Decreased strength, Decreased range of motion  Visit Diagnosis: Acute low back pain, unspecified back pain laterality, with sciatica presence unspecified  Radiculopathy, lumbar region  Difficulty in walking, not elsewhere classified     Problem List Patient Active Problem List   Diagnosis Date Noted  . Encounter for screening colonoscopy 05/04/2015  . Arthritis 10/08/2014  . H/O acute myocardial infarction 10/08/2014  . Acid reflux 10/08/2014  . HLD (hyperlipidemia) 10/08/2014  . BP (high blood pressure) 10/08/2014  . 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 Benign neoplasm of skin 07/19/2006  . Essential (primary) hypertension 11/15/2005  . Decreased potassium in the blood 11/15/2005   Joneen Boers PT, DPT   05/10/2016, 9:33 AM  [facility] [address] [place], [address] Phone: [phone]   Fax:  [phone]  Name: Luke Rojas MRN: [id number] Date of Birth: Jul 23, 1945

## 2016-05-10 NOTE — Patient Instructions (Addendum)
(  Home) Extension: Thoracic With Lumbar Lock - Sitting    Sit with back against chair, knees bent, hands folded across (not shown). Extend trunk over chair back. Hold position for __5__ seconds. Repeat ___10  times per set. Do _3___ sets per session daily.   Copyright  VHI. All rights reserved.      Strengthening: Resisted Extension    Hold band, one in each hand, arms forward. Pull arms back, elbow straight.   Squeeze your shoulder blades together.  Squeeze rear end muscles together  Count out loud for 5 seconds.   Repeat ___10_ times per set. Do _3___ sets per session. Do _1__ sessions per day.  http://orth.exer.us/832   Copyright  VHI. All rights reserved.

## 2016-05-17 ENCOUNTER — Ambulatory Visit: Payer: Medicare Other

## 2016-05-24 ENCOUNTER — Ambulatory Visit: Payer: Medicare Other

## 2016-06-04 NOTE — Progress Notes (Signed)
Patient presents requesting a BP check.  He reports that he checked his blood pressure and got a reading of 158/104.   He is scheduled to have prostate biopsy tomorrow and his wife had to be taken tot he ED last night.  He is under increased stress and this was discussed.   Patient's reading lowered after just a few minutes sitting.  He was advised to that his pressure today was not in a dangerous range, also informed him that they will check his vital signs before doing the procedure tomorrow.  He agrees that he may be a little more anxious about things going on in his life right now and that may have contributed to the earlier eleveted reading.   Patient has been re-assured and appeared more relaxed after talking with him.  He will try to get rest between now and the time of his procedure.

## 2016-06-07 ENCOUNTER — Other Ambulatory Visit: Payer: Self-pay | Admitting: Family Medicine

## 2016-06-07 DIAGNOSIS — M47819 Spondylosis without myelopathy or radiculopathy, site unspecified: Secondary | ICD-10-CM

## 2016-07-03 ENCOUNTER — Encounter: Payer: Self-pay | Admitting: Family Medicine

## 2016-09-16 ENCOUNTER — Emergency Department: Payer: Medicare Other | Admitting: Certified Registered Nurse Anesthetist

## 2016-09-16 ENCOUNTER — Encounter: Payer: Self-pay | Admitting: Emergency Medicine

## 2016-09-16 ENCOUNTER — Emergency Department: Payer: Medicare Other

## 2016-09-16 ENCOUNTER — Emergency Department
Admission: EM | Admit: 2016-09-16 | Discharge: 2016-09-16 | Disposition: A | Discharge status: Home / Self Care | Payer: Medicare Other | Attending: Emergency Medicine | Admitting: Emergency Medicine

## 2016-09-16 ENCOUNTER — Encounter
Admission: EM | Disposition: A | Discharge status: Home / Self Care | Payer: Self-pay | Source: Home / Self Care | Attending: Emergency Medicine

## 2016-09-16 DIAGNOSIS — I2510 Atherosclerotic heart disease of native coronary artery without angina pectoris: Secondary | ICD-10-CM | POA: Insufficient documentation

## 2016-09-16 DIAGNOSIS — Z7982 Long term (current) use of aspirin: Secondary | ICD-10-CM | POA: Diagnosis not present

## 2016-09-16 DIAGNOSIS — Z87891 Personal history of nicotine dependence: Secondary | ICD-10-CM | POA: Diagnosis not present

## 2016-09-16 DIAGNOSIS — E7800 Pure hypercholesterolemia, unspecified: Secondary | ICD-10-CM | POA: Insufficient documentation

## 2016-09-16 DIAGNOSIS — Z955 Presence of coronary angioplasty implant and graft: Secondary | ICD-10-CM | POA: Insufficient documentation

## 2016-09-16 DIAGNOSIS — N390 Urinary tract infection, site not specified: Secondary | ICD-10-CM

## 2016-09-16 DIAGNOSIS — I10 Essential (primary) hypertension: Secondary | ICD-10-CM | POA: Insufficient documentation

## 2016-09-16 DIAGNOSIS — K219 Gastro-esophageal reflux disease without esophagitis: Secondary | ICD-10-CM | POA: Diagnosis not present

## 2016-09-16 DIAGNOSIS — N200 Calculus of kidney: Secondary | ICD-10-CM

## 2016-09-16 DIAGNOSIS — N136 Pyonephrosis: Secondary | ICD-10-CM | POA: Insufficient documentation

## 2016-09-16 DIAGNOSIS — E782 Mixed hyperlipidemia: Secondary | ICD-10-CM | POA: Diagnosis not present

## 2016-09-16 DIAGNOSIS — N400 Enlarged prostate without lower urinary tract symptoms: Secondary | ICD-10-CM | POA: Insufficient documentation

## 2016-09-16 DIAGNOSIS — Z79899 Other long term (current) drug therapy: Secondary | ICD-10-CM | POA: Diagnosis not present

## 2016-09-16 DIAGNOSIS — Z7902 Long term (current) use of antithrombotics/antiplatelets: Secondary | ICD-10-CM | POA: Insufficient documentation

## 2016-09-16 DIAGNOSIS — Z87442 Personal history of urinary calculi: Secondary | ICD-10-CM | POA: Diagnosis not present

## 2016-09-16 DIAGNOSIS — I252 Old myocardial infarction: Secondary | ICD-10-CM | POA: Insufficient documentation

## 2016-09-16 DIAGNOSIS — N201 Calculus of ureter: Secondary | ICD-10-CM

## 2016-09-16 DIAGNOSIS — R109 Unspecified abdominal pain: Secondary | ICD-10-CM

## 2016-09-16 HISTORY — PX: CYSTOSCOPY WITH STENT PLACEMENT: SHX5790

## 2016-09-16 LAB — CBC
HEMATOCRIT: 43.1 % (ref 40.0–52.0)
HEMOGLOBIN: 14.8 g/dL (ref 13.0–18.0)
MCH: 29.8 pg (ref 26.0–34.0)
MCHC: 34.4 g/dL (ref 32.0–36.0)
MCV: 86.8 fL (ref 80.0–100.0)
Platelets: 115 10*3/uL — ABNORMAL LOW (ref 150–440)
RBC: 4.97 MIL/uL (ref 4.40–5.90)
RDW: 14.7 % — ABNORMAL HIGH (ref 11.5–14.5)
WBC: 11.6 10*3/uL — AB (ref 3.8–10.6)

## 2016-09-16 LAB — URINALYSIS, COMPLETE (UACMP) WITH MICROSCOPIC
Bilirubin Urine: NEGATIVE
Glucose, UA: NEGATIVE mg/dL
Ketones, ur: NEGATIVE mg/dL
NITRITE: POSITIVE — AB
Protein, ur: NEGATIVE mg/dL
SPECIFIC GRAVITY, URINE: 1.014 (ref 1.005–1.030)
pH: 5 (ref 5.0–8.0)

## 2016-09-16 LAB — BASIC METABOLIC PANEL
ANION GAP: 9 (ref 5–15)
BUN: 19 mg/dL (ref 6–20)
CHLORIDE: 104 mmol/L (ref 101–111)
CO2: 23 mmol/L (ref 22–32)
Calcium: 8.8 mg/dL — ABNORMAL LOW (ref 8.9–10.3)
Creatinine, Ser: 1.46 mg/dL — ABNORMAL HIGH (ref 0.61–1.24)
GFR calc non Af Amer: 47 mL/min — ABNORMAL LOW (ref >60)
GFR, EST AFRICAN AMERICAN: 54 mL/min — AB (ref >60)
GLUCOSE: 131 mg/dL — AB (ref 65–99)
POTASSIUM: 3.4 mmol/L — AB (ref 3.5–5.1)
Sodium: 136 mmol/L (ref 135–145)

## 2016-09-16 SURGERY — CYSTOSCOPY, WITH STENT INSERTION
Anesthesia: General | Laterality: Right

## 2016-09-16 MED ORDER — ONDANSETRON HCL 4 MG/2ML IJ SOLN: 4.0000 mg | Freq: Once | INTRAMUSCULAR | Status: DC | PRN

## 2016-09-16 MED ORDER — FENTANYL CITRATE (PF) 100 MCG/2ML IJ SOLN
INTRAMUSCULAR | Status: AC
  Filled 2016-09-16: qty 2

## 2016-09-16 MED ORDER — FENTANYL CITRATE (PF) 100 MCG/2ML IJ SOLN
INTRAMUSCULAR | Status: DC | PRN
  Administered 2016-09-16: 25 ug via INTRAVENOUS
  Administered 2016-09-16: 75 ug via INTRAVENOUS

## 2016-09-16 MED ORDER — KETOROLAC TROMETHAMINE 30 MG/ML IJ SOLN
30.0000 mg | Freq: Once | INTRAMUSCULAR | Status: AC
  Administered 2016-09-16: 30 mg via INTRAVENOUS
  Filled 2016-09-16: qty 1

## 2016-09-16 MED ORDER — ONDANSETRON HCL 4 MG/2ML IJ SOLN
INTRAMUSCULAR | Status: AC
  Filled 2016-09-16: qty 2

## 2016-09-16 MED ORDER — PROPOFOL 10 MG/ML IV BOLUS
INTRAVENOUS | Status: DC | PRN
  Administered 2016-09-16: 180 mg via INTRAVENOUS

## 2016-09-16 MED ORDER — SODIUM CHLORIDE 0.9 % IV BOLUS (SEPSIS)
1000.0000 mL | Freq: Once | INTRAVENOUS | Status: AC
  Administered 2016-09-16: 1000 mL via INTRAVENOUS

## 2016-09-16 MED ORDER — EPHEDRINE SULFATE 50 MG/ML IJ SOLN
INTRAMUSCULAR | Status: DC | PRN
  Administered 2016-09-16: 5 mg via INTRAVENOUS

## 2016-09-16 MED ORDER — BELLADONNA ALKALOIDS-OPIUM 16.2-60 MG RE SUPP
RECTAL | Status: DC | PRN
  Administered 2016-09-16: 1 via RECTAL

## 2016-09-16 MED ORDER — DEXTROSE 5 % IV SOLN
1.0000 g | Freq: Once | INTRAVENOUS | Status: AC
  Administered 2016-09-16: 1 g via INTRAVENOUS
  Filled 2016-09-16: qty 10

## 2016-09-16 MED ORDER — DOCUSATE SODIUM 100 MG PO CAPS: 200.0000 mg | ORAL_CAPSULE | Freq: Two times a day (BID) | ORAL | 3 refills | Status: DC

## 2016-09-16 MED ORDER — PHENAZOPYRIDINE HCL 200 MG PO TABS: 200.0000 mg | ORAL_TABLET | Freq: Three times a day (TID) | ORAL | 3 refills | Status: DC | PRN

## 2016-09-16 MED ORDER — FUROSEMIDE 10 MG/ML IJ SOLN
INTRAMUSCULAR | Status: AC
  Administered 2016-09-16: 10 mg via INTRAVENOUS
  Filled 2016-09-16: qty 2

## 2016-09-16 MED ORDER — FENTANYL CITRATE (PF) 100 MCG/2ML IJ SOLN: 25.0000 ug | INTRAMUSCULAR | Status: DC | PRN

## 2016-09-16 MED ORDER — ONDANSETRON HCL 4 MG/2ML IJ SOLN
4.0000 mg | Freq: Once | INTRAMUSCULAR | Status: AC
  Administered 2016-09-16: 4 mg via INTRAVENOUS
  Filled 2016-09-16: qty 2

## 2016-09-16 MED ORDER — HYDROCODONE-ACETAMINOPHEN 7.5-325 MG PO TABS: 1.0000 | ORAL_TABLET | ORAL | 0 refills | Status: DC | PRN

## 2016-09-16 MED ORDER — BELLADONNA ALKALOIDS-OPIUM 16.2-60 MG RE SUPP
RECTAL | Status: AC
  Filled 2016-09-16: qty 1

## 2016-09-16 MED ORDER — LACTATED RINGERS IV SOLN
INTRAVENOUS | Status: DC | PRN
  Administered 2016-09-16: 12:00:00 via INTRAVENOUS

## 2016-09-16 MED ORDER — LIDOCAINE HCL (CARDIAC) 20 MG/ML IV SOLN
INTRAVENOUS | Status: DC | PRN
  Administered 2016-09-16: 100 mg via INTRAVENOUS

## 2016-09-16 MED ORDER — PROPOFOL 10 MG/ML IV BOLUS
INTRAVENOUS | Status: AC
  Filled 2016-09-16: qty 20

## 2016-09-16 MED ORDER — LIDOCAINE HCL 2 % EX GEL
CUTANEOUS | Status: AC
  Filled 2016-09-16: qty 10

## 2016-09-16 MED ORDER — PHENYLEPHRINE HCL 10 MG/ML IJ SOLN
INTRAMUSCULAR | Status: DC | PRN
  Administered 2016-09-16: 50 ug via INTRAVENOUS
  Administered 2016-09-16: 100 ug via INTRAVENOUS
  Administered 2016-09-16 (×3): 50 ug via INTRAVENOUS
  Administered 2016-09-16: 100 ug via INTRAVENOUS

## 2016-09-16 MED ORDER — ONDANSETRON 8 MG PO TBDP: 4.0000 mg | ORAL_TABLET | Freq: Four times a day (QID) | ORAL | 3 refills | Status: DC | PRN

## 2016-09-16 MED ORDER — LIDOCAINE HCL 2 % EX GEL
CUTANEOUS | Status: DC | PRN
  Administered 2016-09-16: 1 via URETHRAL

## 2016-09-16 MED ORDER — ONDANSETRON HCL 4 MG/2ML IJ SOLN
INTRAMUSCULAR | Status: DC | PRN
  Administered 2016-09-16: 4 mg via INTRAVENOUS

## 2016-09-16 MED ORDER — SUCCINYLCHOLINE CHLORIDE 20 MG/ML IJ SOLN
INTRAMUSCULAR | Status: DC | PRN
  Administered 2016-09-16: 120 mg via INTRAVENOUS

## 2016-09-16 MED ORDER — FUROSEMIDE 10 MG/ML IJ SOLN
10.0000 mg | Freq: Once | INTRAMUSCULAR | Status: AC
  Administered 2016-09-16: 10 mg via INTRAVENOUS

## 2016-09-16 MED ORDER — DEXAMETHASONE SODIUM PHOSPHATE 10 MG/ML IJ SOLN
INTRAMUSCULAR | Status: DC | PRN
  Administered 2016-09-16: 10 mg via INTRAVENOUS

## 2016-09-16 MED ORDER — SULFAMETHOXAZOLE-TRIMETHOPRIM 800-160 MG PO TABS: 1.0000 | ORAL_TABLET | Freq: Two times a day (BID) | ORAL | 0 refills | Status: DC

## 2016-09-16 SURGICAL SUPPLY — 19 items
BAG DRAIN CYSTO-URO LG1000N (MISCELLANEOUS) ×2 IMPLANT
CONRAY 43 FOR UROLOGY 50M (MISCELLANEOUS) ×2 IMPLANT
GLOVE BIO SURGEON STRL SZ7 (GLOVE) ×8 IMPLANT
GLOVE BIO SURGEON STRL SZ7.5 (GLOVE) ×2 IMPLANT
GOWN STRL REUS W/ TWL LRG LVL4 (GOWN DISPOSABLE) ×1 IMPLANT
GOWN STRL REUS W/ TWL XL LVL3 (GOWN DISPOSABLE) ×1 IMPLANT
GOWN STRL REUS W/TWL LRG LVL4 (GOWN DISPOSABLE) ×1
GOWN STRL REUS W/TWL XL LVL3 (GOWN DISPOSABLE) ×1
GUIDEWIRE STR ZIPWIRE 035X150 (MISCELLANEOUS) ×2 IMPLANT
KIT RM TURNOVER CYSTO AR (KITS) ×2 IMPLANT
PACK CYSTO AR (MISCELLANEOUS) ×2 IMPLANT
SET CYSTO W/LG BORE CLAMP LF (SET/KITS/TRAYS/PACK) ×2 IMPLANT
SOL .9 NS 3000ML IRR  AL (IV SOLUTION) ×1
SOL .9 NS 3000ML IRR UROMATIC (IV SOLUTION) ×1 IMPLANT
SOL PREP PVP 2OZ (MISCELLANEOUS) ×2
SOLUTION PREP PVP 2OZ (MISCELLANEOUS) ×1 IMPLANT
STENT URET 6FRX24 CONTOUR (STENTS) ×2 IMPLANT
STENT URET 6FRX26 CONTOUR (STENTS) IMPLANT
WATER STERILE IRR 1000ML POUR (IV SOLUTION) ×2 IMPLANT

## 2016-09-16 NOTE — Op Note (Signed)
Preoperative diagnosis: 1. Right ureterolithiasis with hydronephrosis                                             2. Urinary infection  Postoperative diagnosis: Same   Procedure: 1. Cystoscopy with right double pigtail ureteral stent placement                      2. Fluoroscopy    Surgeon: Otelia Limes. Yves Dill MD  Anesthesia: General  Indications:See the history and physical. After informed consent the above procedure(s) were requested     Technique and findings: After adequate general anesthesia had been obtained the patient was placed into dorsal lithotomy position and the perineum was prepped and draped in the usual fashion. Fluoroscopy confirmed the presence of an 11 mm right proximal ureteral calculus. The 2 French the scope was then coupled the camera and visually advanced into the bladder. The patient had lateral lobe prostatic hypertrophy. No bladder mucosal lesions were identified. Both ureteral orifices were identified and had clear efflux. The right ureteral orifice was then cannulated with a 0.035 Glidewire and guidewire advanced to the renal pelvis. A 6 French by 24 cm double pigtail stent with retrieval suture was then advanced over the guidewire and positioned in the ureter under fluoroscopic guidance. The guidewire was removed taking care leaving the stent in position. The bladder was drained and the cystoscope was removed. 10 cc of viscous Xylocaine was instilled within the urethra. A B&O suppository was placed. The procedure was then terminated and patient transferred to the recovery room in stable condition.

## 2016-09-16 NOTE — Anesthesia Postprocedure Evaluation (Signed)
Anesthesia Post Note  Patient: Luke Rojas  Procedure(s) Performed: Procedure(s) (LRB): CYSTOSCOPY WITH STENT PLACEMENT (Right)  Patient location during evaluation: PACU Anesthesia Type: General Level of consciousness: awake and alert Pain management: pain level controlled Vital Signs Assessment: post-procedure vital signs reviewed and stable Respiratory status: spontaneous breathing and respiratory function stable Cardiovascular status: stable Anesthetic complications: no     Last Vitals:  Vitals:   09/16/16 1100 09/16/16 1215  BP: 119/66 113/63  Pulse: (!) 58 73  Resp: 19   Temp:  36.4 C    Last Pain:  Vitals:   09/16/16 1215  PainSc: 0-No pain                 KEPHART,Jaecob K

## 2016-09-16 NOTE — OR Nursing (Signed)
Patient voided

## 2016-09-16 NOTE — ED Provider Notes (Signed)
[facility] Emergency Department Provider Note  ____________________________________________   First MD Initiated Contact with Patient 09/16/16 [phone]     (approximate)  I have reviewed the triage vital signs and the nursing notes.   HISTORY  Chief Complaint Flank Pain   HPI Luke Rojas is a 71 y.o. male with a history of kidney stones is present emergency department with right lower back pain that started at 1 AM. He says that the pain is sharp and a 10 out of 10 at this time. He says that he had one episode of nausea and vomiting at home. Says that he has had multiple kidney stones in the past and has had intervention on 2 of them. Sees Dr. Eliberto Ivory in [place]. Denying any abdominal pain at this time but says that the pain has been shooting from his right lower back around his abdomen intermittently. Says that he has had intermittent blood in his urine over the past several weeks but says that he also procedure done on his prostate and had spoken to his urologist about this who said this was a normal aftereffects of the procedure.   Past Medical History:  Diagnosis Date  . Chronic kidney disease    kidney stones  . Coronary artery disease    2 stents   . GERD (gastroesophageal reflux disease)   . Hypercholesteremia   . Hypertension   . Myocardial infarction [facility]) 2013    Patient Active Problem List   Diagnosis Date Noted  . Encounter for screening colonoscopy 05/04/2015  . Arthritis 10/08/2014  . H/O acute myocardial infarction 10/08/2014  . Acid reflux 10/08/2014  . HLD (hyperlipidemia) 10/08/2014  . BP (high blood pressure) 10/08/2014  . 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 Benign neoplasm of skin 07/19/2006  . Essential (primary) hypertension 11/15/2005  . Decreased potassium in the blood 11/15/2005    Past Surgical History:  Procedure Laterality Date  . CARDIAC CATHETERIZATION  2013   2 stents  .  COLONOSCOPY  04/27/05  . COLONOSCOPY WITH PROPOFOL N/A 06/08/2015   Procedure: COLONOSCOPY WITH PROPOFOL;  Surgeon: Robert Bellow, MD;  Location: [facility] ENDOSCOPY;  Service: Endoscopy;  Laterality: N/A;  . EXTRACORPOREAL SHOCK WAVE LITHOTRIPSY Left 11/04/2014   has had 2 previous lithotripsies  . EXTRACORPOREAL SHOCK WAVE LITHOTRIPSY Left 03/24/2015   Procedure: EXTRACORPOREAL SHOCK WAVE LITHOTRIPSY (ESWL);  Surgeon: Royston Cowper, MD;  Location: [facility] ORS;  Service: Urology;  Laterality: Left;    Prior to Admission medications   Medication Sig Start Date End Date Taking? Authorizing Provider  aspirin 81 MG tablet Take 81 mg by mouth daily.    [provider]  atorvastatin (LIPITOR) 40 MG tablet Take 1 tablet by mouth daily. 09/29/11   [provider]  ciprofloxacin (CIPRO) 500 MG tablet Take 1 tablet (500 mg total) by mouth 2 (two) times daily. 02/21/16   Chrismon, Vickki Muff, PA  clopidogrel (PLAVIX) 75 MG tablet  01/12/15   [provider]  cyclobenzaprine (FLEXERIL) 5 MG tablet Take 1 tablet (5 mg total) by mouth 3 (three) times daily as needed for muscle spasms. 04/03/16   Chrismon, Vickki Muff, PA  HYDROcodone-acetaminophen (NORCO/VICODIN) 5-325 MG tablet Take 1 tablet by mouth every 6 (six) hours as needed for moderate pain. 03/27/16   Chrismon, Vickki Muff, PA  meloxicam (MOBIC) 15 MG tablet TAKE 1 TABLET BY MOUTH ONCE A DAY 06/07/16   Chrismon, Vickki Muff, PA  metoprolol (LOPRESSOR) 50 MG  tablet Take 1 tablet by mouth. Morning and night 07/08/14   [provider]  MULTIPLE VITAMIN PO Take 1 tablet by mouth daily.    [provider]  naproxen (NAPROSYN) 500 MG tablet  03/20/16   [provider]  nitroGLYCERIN (NITROSTAT) 0.4 MG SL tablet Place 1 tablet under the tongue. Reported on 03/24/2015 09/29/11   [provider]  Omega-3 Fatty Acids (FISH OIL) 1200 MG CAPS Take 1 capsule by mouth 2 (two) times daily.    [provider]  omeprazole  (PRILOSEC) 20 MG capsule  01/26/15   [provider]  ondansetron (ZOFRAN) 4 MG tablet Take 1 tablet (4 mg total) by mouth daily as needed for nausea or vomiting. 04/18/16 04/18/17  Gregor Hams, MD  oxyCODONE-acetaminophen (PERCOCET) 10-325 MG tablet Take 1 tablet by mouth every 6 (six) hours as needed for pain. 04/18/16 04/18/17  Gregor Hams, MD  phenazopyridine (PYRIDIUM) 200 MG tablet Take 1 tablet (200 mg total) by mouth 3 (three) times daily as needed for pain. 02/21/16   Chrismon, Vickki Muff, PA  ramipril (ALTACE) 10 MG capsule Take 1 capsule by mouth daily. 07/08/14   [provider]  tamsulosin (FLOMAX) 0.4 MG CAPS capsule Take 1 capsule (0.4 mg total) by mouth daily after breakfast. 04/18/16   Gregor Hams, MD    Allergies Codeine  Family History  Problem Relation Age of Onset  . Pulmonary embolism Mother   . Transient ischemic attack Mother   . Pancreatic cancer Father   . Hypertension Father   . Cirrhosis Brother     Social History Social History  Substance Use Topics  . Smoking status: Former Smoker    Packs/day: 1.00    Years: 30.00    Types: Cigarettes    Quit date: 03/05/1984  . Smokeless tobacco: Current User    Types: Chew  . Alcohol use No    Review of Systems  Constitutional: No fever/chills Eyes: No visual changes. ENT: No sore throat. Cardiovascular: Denies chest pain. Respiratory: Denies shortness of breath. Gastrointestinal: No diarrhea.  No constipation. Genitourinary: Negative for dysuria. Musculoskeletal: as above Skin: Negative for rash. Neurological: Negative for headaches, focal weakness or numbness.   ____________________________________________   PHYSICAL EXAM:  VITAL SIGNS: ED Triage Vitals [09/16/16 0755]  Enc Vitals Group     BP      Pulse      Resp      Temp      Temp src      SpO2      Weight 190 lb (86.2 kg)     Height 5\' 8"  (1.727 m)     Head Circumference      Peak Flow      Pain Score 10       Pain Loc      Pain Edu?      Excl. in [place]?     Constitutional: Alert and oriented. Well appearing and in no acute distress. Eyes: Conjunctivae are normal.  Head: Atraumatic. Nose: No congestion/rhinnorhea. Mouth/Throat: Mucous membranes are moist.  Neck: No stridor.   Cardiovascular: Normal rate, regular rhythm. Grossly normal heart sounds.   Respiratory: Normal respiratory effort.  No retractions. Lungs CTAB. Gastrointestinal: Soft and nontender. No distention. Right-sided low CVA tenderness to palpation. Musculoskeletal: No lower extremity tenderness nor edema.  No joint effusions. Neurologic:  Normal speech and language. No gross focal neurologic deficits are appreciated. Skin:  Skin is warm, dry and intact. No rash noted.  Psychiatric: Mood and affect are normal. Speech and behavior are normal.  ____________________________________________   LABS (all labs ordered are listed, but only abnormal results are displayed)  Labs Reviewed  URINALYSIS, COMPLETE (UACMP) WITH MICROSCOPIC - Abnormal; Notable for the following:       Result Value   Color, Urine YELLOW (*)    APPearance HAZY (*)    Hgb urine dipstick MODERATE (*)    Nitrite POSITIVE (*)    Leukocytes, UA LARGE (*)    Bacteria, UA RARE (*)    Squamous Epithelial / LPF 0-5 (*)    All other components within normal limits  CBC - Abnormal; Notable for the following:    WBC 11.6 (*)    RDW 14.7 (*)    Platelets 115 (*)    All other components within normal limits  BASIC METABOLIC PANEL - Abnormal; Notable for the following:    Potassium 3.4 (*)    Glucose, Bld 131 (*)    Creatinine, Ser 1.46 (*)    Calcium 8.8 (*)    GFR calc non Af Amer 47 (*)    GFR calc Af Amer 54 (*)    All other components within normal limits  URINE CULTURE   ____________________________________________  EKG   ____________________________________________  RADIOLOGY  Right-sided hydronephrosis with proximal right stone 11 mm ST.  STONE LIKELY ALSO SEEN ON KUB.  ____________________________________________   PROCEDURES  Procedure(s) performed:   Procedures  Critical Care performed:   ____________________________________________   INITIAL IMPRESSION / ASSESSMENT AND PLAN / ED COURSE  Pertinent labs & imaging results that were available during my care of the patient were reviewed by me and considered in my medical decision making (see chart for details).  ----------------------------------------- 9:57 AM on 09/16/2016 -----------------------------------------  Patient is pain-free at this time. I discussed the case with his urologist, Dr. Rogers Blocker, who says that he will come to the bedside to evaluate the patient. The patient has a UTI and what appears to be a proximal 11 mm stone. He is afebrile with only mild leukocytosis. Antibiotics begun.    ----------------------------------------- 10:49 AM on 09/16/2016 -----------------------------------------  Patient was evaluated by Dr. Rogers Blocker at the bedside and will be taking the patient to the operating room for stent. Patient kept nothing by mouth while in the emergency department.  ____________________________________________   FINAL CLINICAL IMPRESSION(S) / ED DIAGNOSES  Final diagnoses:  Right flank pain  UTI. Kidney stone.    NEW MEDICATIONS STARTED DURING THIS VISIT:  New Prescriptions   No medications on file     Note:  This document was prepared using Dragon voice recognition software and may include unintentional dictation errors.     Orbie Pyo, MD 09/16/16 1049

## 2016-09-16 NOTE — ED Notes (Signed)
Patient transported to Ultrasound 

## 2016-09-16 NOTE — ED Notes (Signed)
FIRST NURSE NOTE:  Pt reports he has been fighting a kidney stone since around 1am. C/o right flank pain. Pt ambulating in lobby, offered wheelchair, but declined.

## 2016-09-16 NOTE — Anesthesia Preprocedure Evaluation (Signed)
Anesthesia Evaluation  Patient identified by MRN, date of birth, ID band Patient awake    Reviewed: Allergy & Precautions, NPO status , Patient's Chart, lab work & pertinent test results  History of Anesthesia Complications Negative for: history of anesthetic complications  Airway Mallampati: III       Dental  (+) Upper Dentures, Lower Dentures   Pulmonary neg pulmonary ROS, former smoker,           Cardiovascular hypertension, Pt. on medications + CAD, + Past MI and + Cardiac Stents       Neuro/Psych negative neurological ROS     GI/Hepatic GERD  Medicated and Controlled,  Endo/Other  negative endocrine ROS  Renal/GU Renal disease (stents)     Musculoskeletal   Abdominal   Peds  Hematology negative hematology ROS (+)   Anesthesia Other Findings   Reproductive/Obstetrics                             Anesthesia Physical Anesthesia Plan  ASA: III and emergent  Anesthesia Plan: General   Post-op Pain Management:    Induction: Intravenous  PONV Risk Score and Plan: 2 and Ondansetron and Dexamethasone  Airway Management Planned: Oral ETT  Additional Equipment:   Intra-op Plan:   Post-operative Plan:   Informed Consent: I have reviewed the patients History and Physical, chart, labs and discussed the procedure including the risks, benefits and alternatives for the proposed anesthesia with the patient or authorized representative who has indicated his/her understanding and acceptance.     Plan Discussed with:   Anesthesia Plan Comments:         Anesthesia Quick Evaluation

## 2016-09-16 NOTE — Anesthesia Procedure Notes (Signed)
Procedure Name: Intubation Performed by: Demetrius Charity Pre-anesthesia Checklist: Patient identified, Patient being monitored, Timeout performed, Emergency Drugs available and Suction available Patient Re-evaluated:Patient Re-evaluated prior to induction Oxygen Delivery Method: Circle system utilized Preoxygenation: Pre-oxygenation with 100% oxygen Induction Type: IV induction Ventilation: Oral airway inserted - appropriate to patient size Laryngoscope Size: Mac and 3 Grade View: Grade I Tube type: Oral Tube size: 7.0 mm Number of attempts: 1 Airway Equipment and Method: Stylet Placement Confirmation: ETT inserted through vocal cords under direct vision,  positive ETCO2 and breath sounds checked- equal and bilateral Secured at: 23 cm Tube secured with: Tape Dental Injury: Teeth and Oropharynx as per pre-operative assessment

## 2016-09-16 NOTE — H&P (Addendum)
Urology Admission H&P Right flank pain Chief Complaint:   History of Present Illness: Sudden onset right flank pain at 1 AM today. This was associated with chills nausea and vomiting. Renal ultrasound indicated moderate right hydronephrosis with an 11 mm proximal right ureteral stone. Urinalysis was nitrite positive and white blood cell count was elevated at 11,600.  Past Medical History:  Diagnosis Date  . Chronic kidney disease    kidney stones  . Coronary artery disease    2 stents   . GERD (gastroesophageal reflux disease)   . Hypercholesteremia   . Hypertension   . Myocardial infarction [facility]) 2013       BPH Past Surgical History:  Procedure Laterality Date  . CARDIAC CATHETERIZATION  2013   2 stents  . COLONOSCOPY  04/27/05  . COLONOSCOPY WITH PROPOFOL N/A 06/08/2015   Procedure: COLONOSCOPY WITH PROPOFOL;  Surgeon: Robert Bellow, MD;  Location: [facility] ENDOSCOPY;  Service: Endoscopy;  Laterality: N/A;  . EXTRACORPOREAL SHOCK WAVE LITHOTRIPSY Left 11/04/2014   has had 2 previous lithotripsies  . EXTRACORPOREAL SHOCK WAVE LITHOTRIPSY Left 03/24/2015   Procedure: EXTRACORPOREAL SHOCK WAVE LITHOTRIPSY (ESWL);  Surgeon: Royston Cowper, MD;  Location: [facility] ORS;  Service: Urology;  Laterality: Left;  TUMT 07/2016  Home Medications:   (Not in a hospital admission) Allergies:  Allergies  Allergen Reactions  . Codeine Nausea Only    Family History  Problem Relation Age of Onset  . Pulmonary embolism Mother   . Transient ischemic attack Mother   . Pancreatic cancer Father   . Hypertension Father   . Cirrhosis Brother    Social History:  reports that he quit smoking about 32 years ago. His smoking use included Cigarettes. He has a 30.00 pack-year smoking history. His smokeless tobacco use includes Chew. He reports that he does not drink alcohol or use drugs.  Review of Systems  Constitutional: Positive for chills.  HENT: Positive for hearing loss.   Eyes: Negative.    Respiratory: Negative.   Cardiovascular: Negative.   Gastrointestinal: Positive for abdominal pain, nausea and vomiting.  Genitourinary: Positive for frequency and urgency.  Musculoskeletal: Positive for back pain.  Skin: Negative.   Neurological: Negative.   Endo/Heme/Allergies: Negative.   Psychiatric/Behavioral: Negative.     Physical Exam:  Vital signs in last 24 hours: Pulse Rate:  [65] 65 (07/15 0900) Resp:  [18] 18 (07/15 0900) BP: (121)/(61) 121/61 (07/15 0900) SpO2:  [99 %] 99 % (07/15 0900) Weight:  [86.2 kg (190 lb)] 86.2 kg (190 lb) (07/15 0755) Physical Exam  Constitutional: He appears well-nourished.  HENT:  Head: Normocephalic.  Eyes: Conjunctivae are normal.  Neck: Normal range of motion.  Cardiovascular: Regular rhythm.   Respiratory: Effort normal.  GI: Soft. Bowel sounds are normal.  Musculoskeletal: Normal range of motion.  Neurological: He is alert.  Skin: Skin is dry.  Psychiatric: He has a normal mood and affect.    Laboratory Data:  Results for orders placed or performed during the hospital encounter of 09/16/16 (from the past 24 hour(s))  CBC     Status: Abnormal   Collection Time: 09/16/16  7:57 AM  Result Value Ref Range   WBC 11.6 (H) 3.8 - 10.6 K/uL   RBC 4.97 4.40 - 5.90 MIL/uL   Hemoglobin 14.8 13.0 - 18.0 g/dL   HCT 43.1 40.0 - 52.0 %   MCV 86.8 80.0 - 100.0 fL   MCH 29.8 26.0 - 34.0 pg   MCHC 34.4 32.0 - 36.0  g/dL   RDW 14.7 (H) 11.5 - 14.5 %   Platelets 115 (L) 150 - 440 K/uL  Basic metabolic panel     Status: Abnormal   Collection Time: 09/16/16  7:57 AM  Result Value Ref Range   Sodium 136 135 - 145 mmol/L   Potassium 3.4 (L) 3.5 - 5.1 mmol/L   Chloride 104 101 - 111 mmol/L   CO2 23 22 - 32 mmol/L   Glucose, Bld 131 (H) 65 - 99 mg/dL   BUN 19 6 - 20 mg/dL   Creatinine, Ser 1.46 (H) 0.61 - 1.24 mg/dL   Calcium 8.8 (L) 8.9 - 10.3 mg/dL   GFR calc non Af Amer 47 (L) >60 mL/min   GFR calc Af Amer 54 (L) >60 mL/min   Anion  gap 9 5 - 15  Urinalysis, Complete w Microscopic     Status: Abnormal   Collection Time: 09/16/16  8:07 AM  Result Value Ref Range   Color, Urine YELLOW (A) YELLOW   APPearance HAZY (A) CLEAR   Specific Gravity, Urine 1.014 1.005 - 1.030   pH 5.0 5.0 - 8.0   Glucose, UA NEGATIVE NEGATIVE mg/dL   Hgb urine dipstick MODERATE (A) NEGATIVE   Bilirubin Urine NEGATIVE NEGATIVE   Ketones, ur NEGATIVE NEGATIVE mg/dL   Protein, ur NEGATIVE NEGATIVE mg/dL   Nitrite POSITIVE (A) NEGATIVE   Leukocytes, UA LARGE (A) NEGATIVE   RBC / HPF 6-30 0 - 5 RBC/hpf   WBC, UA TOO NUMEROUS TO COUNT 0 - 5 WBC/hpf   Bacteria, UA RARE (A) NONE SEEN   Squamous Epithelial / LPF 0-5 (A) NONE SEEN   Mucous PRESENT    No results found for this or any previous visit (from the past 240 hour(s)). Creatinine:  Recent Labs  09/16/16 0757  CREATININE 1.46*   Baseline Creatinine: 1.46  Impression/Assessment:  Right ureterolithiasis with hydronephrosis and urinary infection  Plan:  Cystoscopy with right stent placement  Rojas,Luke R 09/16/2016, 10:35 AM

## 2016-09-16 NOTE — Transfer of Care (Signed)
Immediate Anesthesia Transfer of Care Note  Patient: Luke Rojas  Procedure(s) Performed: Procedure(s): CYSTOSCOPY WITH STENT PLACEMENT (Right)  Patient Location: PACU  Anesthesia Type:General  Level of Consciousness: awake and alert   Airway & Oxygen Therapy: Patient Spontanous Breathing and Patient connected to face mask oxygen  Post-op Assessment: Report given to RN and Post -op Vital signs reviewed and stable  Post vital signs: Reviewed and stable  Last Vitals:  Vitals:   09/16/16 1030 09/16/16 1100  BP: 139/71 119/66  Pulse: 63 (!) 58  Resp: 15 19    Last Pain:  Vitals:   09/16/16 0803  PainSc: 10-Worst pain ever         Complications: No apparent anesthesia complications

## 2016-09-16 NOTE — Anesthesia Post-op Follow-up Note (Cosign Needed)
Anesthesia QCDR form completed.        

## 2016-09-16 NOTE — Discharge Instructions (Addendum)
AMBULATORY SURGERY  DISCHARGE INSTRUCTIONS   1) The drugs that you were given will stay in your system until tomorrow so for the next 24 hours you should not:  A) Drive an automobile B) Make any legal decisions C) Drink any alcoholic beverage   2) You may resume regular meals tomorrow.  Today it is better to start with liquids and gradually work up to solid foods.  You may eat anything you prefer, but it is better to start with liquids, then soup and crackers, and gradually work up to solid foods.   3) Please notify your doctor immediately if you have any unusual bleeding, trouble breathing, redness and pain at the surgery site, drainage, fever, or pain not relieved by medication.    4) Additional Instructions:        Please contact your physician with any problems or Same Day Surgery at [phone], Monday through Friday 6 am to 4 pm, or  at [facility] number at [phone].Flank Pain, Adult Flank pain is pain that is located on the side of the body between the upper abdomen and the back. This area is called the flank. The pain may occur over a short period of time (acute), or it may be long-term or recurring (chronic). It may be mild or severe. Flank pain can be caused by many things, including:  Muscle soreness or injury.  Kidney stones or kidney disease.  Stress.  A disease of the spine (vertebral disk disease).  A lung infection (pneumonia).  Fluid around the lungs (pulmonary edema).  A skin rash caused by the chickenpox virus (shingles).  Tumors that affect the back of the abdomen.  Gallbladder disease.  Follow these instructions at home:  Drink enough fluid to keep your urine clear or pale yellow.  Rest as told by your health care provider.  Take over-the-counter and prescription medicines only as told by your health care provider.  Keep a journal to track what has caused your flank pain and what has made it feel better.  Keep all  follow-up visits as told by your health care provider. This is important. Contact a health care provider if:  Your pain is not controlled with medicine.  You have new symptoms.  Your pain gets worse.  You have a fever.  Your symptoms last longer than 2-3 days.  You have trouble urinating or you are urinating very frequently. Get help right away if:  You have trouble breathing or you are short of breath.  Your abdomen hurts or it is swollen or red.  You have nausea or vomiting.  You feel faint or you pass out.  You have blood in your urine. Summary  Flank pain is pain that is located on the side of the body between the upper abdomen and the back.  The pain may occur over a short period of time (acute), or it may be long-term or recurring (chronic). It may be mild or severe.  Flank pain can be caused by many things.  Contact your health care provider if your symptoms get worse or they last longer than 2-3 days. This information is not intended to replace advice given to you by your health care provider. Make sure you discuss any questions you have with your health care provider. Document Released: 04/12/2005 Document Revised: 05/04/2016 Document Reviewed: 05/04/2016 Elsevier Interactive Patient Education  2018 [employer].  Dysuria Dysuria is pain or discomfort while urinating. The pain or discomfort may be felt in the tube that  carries urine out of the bladder (urethra) or in the surrounding tissue of the genitals. The pain may also be felt in the groin area, lower abdomen, and lower back. You may have to urinate frequently or have the sudden feeling that you have to urinate (urgency). Dysuria can affect both men and women, but is more common in women. Dysuria can be caused by many different things, including:  Urinary tract infection in women.  Infection of the kidney or bladder.  Kidney stones or bladder stones.  Certain sexually transmitted infections (STIs), such  as chlamydia.  Dehydration.  Inflammation of the vagina.  Use of certain medicines.  Use of certain soaps or scented products that cause irritation.  Follow these instructions at home: Watch your dysuria for any changes. The following actions may help to reduce any discomfort you are feeling:  Drink enough fluid to keep your urine clear or pale yellow.  Empty your bladder often. Avoid holding urine for long periods of time.  After a bowel movement or urination, women should cleanse from front to back, using each tissue only once.  Empty your bladder after sexual intercourse.  Take medicines only as directed by your health care provider.  If you were prescribed an antibiotic medicine, finish it all even if you start to feel better.  Avoid caffeine, tea, and alcohol. They can irritate the bladder and make dysuria worse. In men, alcohol may irritate the prostate.  Keep all follow-up visits as directed by your health care provider. This is important.  If you had any tests done to find the cause of dysuria, it is your responsibility to obtain your test results. Ask the lab or department performing the test when and how you will get your results. Talk with your health care provider if you have any questions about your results.  Contact a health care provider if:  You develop pain in your back or sides.  You have a fever.  You have nausea or vomiting.  You have blood in your urine.  You are not urinating as often as you usually do. Get help right away if:  You pain is severe and not relieved with medicines.  You are unable to hold down any fluids.  You or someone else notices a change in your mental function.  You have a rapid heartbeat at rest.  You have shaking or chills.  You feel extremely weak. This information is not intended to replace advice given to you by your health care provider. Make sure you discuss any questions you have with your health care  provider. Document Released: 11/18/2003 Document Revised: 07/28/2015 Document Reviewed: 10/15/2013 Elsevier Interactive Patient Education  2018 [employer].   Kidney Stones Kidney stones (urolithiasis) are solid, rock-like deposits that form inside of the organs that make urine (kidneys). A kidney stone may form in a kidney and move into the bladder, where it can cause intense pain and block the flow of urine. Kidney stones are created when high levels of certain minerals are found in the urine. They are usually passed through urination, but in some cases, medical treatment may be needed to remove them. What are the causes? Kidney stones may be caused by:  A condition in which certain glands produce too much parathyroid hormone (primary hyperparathyroidism), which causes too much calcium buildup in the blood.  Buildup of uric acid crystals in the bladder (hyperuricosuria). Uric acid is a chemical that the body produces when you eat certain foods. It usually exits  the body in the urine.  Narrowing (stricture) of one or both of the tubes that drain urine from the kidneys to the bladder (ureters).  A kidney blockage that is present at birth (congenital obstruction).  Past surgery on the kidney or the ureters, such as gastric bypass surgery.  What increases the risk? The following factors make you more likely to develop kidney stones:  Having had a kidney stone in the past.  Having a family history of kidney stones.  Not drinking enough water.  Eating a diet that is high in protein, salt (sodium), or sugar.  Being overweight or obese.  What are the signs or symptoms? Symptoms of a kidney stone may include:  Nausea.  Vomiting.  Blood in the urine (hematuria).  Pain in the side of the abdomen, right below the ribs (flank pain). Pain usually spreads (radiates) to the groin.  Needing to urinate frequently or urgently.  How is this diagnosed? This condition may be diagnosed  based on:  Your medical history.  A physical exam.  Blood tests.  Urine tests.  CT scan.  Abdominal X-ray.  A procedure to examine the inside of the bladder (cystoscopy).  How is this treated? Treatment for kidney stones depends on the size, location, and makeup of the stones. Treatment may involve:  Analyzing your urine before and after you pass the stone through urination.  Being monitored at the hospital until you pass the stone through urination.  Increasing your fluid intake and decreasing the amount of calcium and protein in your diet.  A procedure to break up kidney stones in the bladder using: ? A focused beam of light (laser therapy). ? Shock waves (extracorporeal shock wave lithotripsy).  Surgery to remove kidney stones. This may be needed if you have severe pain or have stones that block your urinary tract.  Follow these instructions at home: Eating and drinking   Drink enough fluid to keep your urine clear or pale yellow. This will help you to pass the kidney stone.  If directed, change your diet. This may include: ? Limiting how much sodium you eat. ? Eating more fruits and vegetables. ? Limiting how much meat, poultry, fish, and eggs you eat.  Follow instructions from your health care provider about eating or drinking restrictions. General instructions  Collect urine samples as told by your health care provider. You may need to collect a urine sample: ? 24 hours after you pass the stone. ? 8-12 weeks after passing the kidney stone, and every 6-12 months after that.  Strain your urine every time you urinate, for as long as directed. Use the strainer that your health care provider recommends.  Do not throw out the kidney stone after passing it. Keep the stone so it can be tested by your health care provider. Testing the makeup of your kidney stone may help prevent you from getting kidney stones in the future.  Take over-the-counter and prescription  medicines only as told by your health care provider.  Keep all follow-up visits as told by your health care provider. This is important. You may need follow-up X-rays or ultrasounds to make sure that your stone has passed. How is this prevented? To prevent another kidney stone:  Drink enough fluid to keep your urine clear or pale yellow. This is the best way to prevent kidney stones.  Eat a healthy diet and follow recommendations from your health care provider about foods to avoid. You may be instructed to eat a low-protein  diet. Recommendations vary depending on the type of kidney stone that you have.  Maintain a healthy weight.  Contact a health care provider if:  You have pain that gets worse or does not get better with medicine. Get help right away if:  You have a fever or chills.  You develop severe pain.  You develop new abdominal pain.  You faint.  You are unable to urinate. This information is not intended to replace advice given to you by your health care provider. Make sure you discuss any questions you have with your health care provider. Document Released: 02/19/2005 Document Revised: 09/09/2015 Document Reviewed: 08/05/2015 Elsevier Interactive Patient Education  2017 [employer]  Ureteral Stent Implantation, Care After Refer to this sheet in the next few weeks. These instructions provide you with information about caring for yourself after your procedure. Your health care provider may also give you more specific instructions. Your treatment has been planned according to current medical practices, but problems sometimes occur. Call your health care provider if you have any problems or questions after your procedure. What can I expect after the procedure? After the procedure, it is common to have:  Nausea.  Mild pain when you urinate. You may feel this pain in your lower back or lower abdomen. Pain should stop within a few minutes after you urinate. This may last  for up to 1 week.  A small amount of blood in your urine for several days.  Follow these instructions at home:  Medicines  Take over-the-counter and prescription medicines only as told by your health care provider.  If you were prescribed an antibiotic medicine, take it as told by your health care provider. Do not stop taking the antibiotic even if you start to feel better.  Do not drive for 24 hours if you received a sedative.  Do not drive or operate heavy machinery while taking prescription pain medicines. Activity  Return to your normal activities as told by your health care provider. Ask your health care provider what activities are safe for you.  Do not lift anything that is heavier than 10 lb (4.5 kg). Follow this limit for 1 week after your procedure, or for as long as told by your health care provider. General instructions  Watch for any blood in your urine. Call your health care provider if the amount of blood in your urine increases.  If you have a catheter: ? Follow instructions from your health care provider about taking care of your catheter and collection bag. ? Do not take baths, swim, or use a hot tub until your health care provider approves.  Drink enough fluid to keep your urine clear or pale yellow.  Keep all follow-up visits as told by your health care provider. This is important. Contact a health care provider if:  You have pain that gets worse or does not get better with medicine, especially pain when you urinate.  You have difficulty urinating.  You feel nauseous or you vomit repeatedly during a period of more than 2 days after the procedure. Get help right away if:  Your urine is dark red or has blood clots in it.  You are leaking urine (have incontinence).  The end of the stent comes out of your urethra.  You cannot urinate.  You have sudden, sharp, or severe pain in your abdomen or lower back.  You have a fever. This information is not  intended to replace advice given to you by your health care  provider. Make sure you discuss any questions you have with your health care provider. Document Released: 10/22/2012 Document Revised: 07/28/2015 Document Reviewed: 09/03/2014 Elsevier Interactive Patient Education  [employer].

## 2016-09-16 NOTE — ED Triage Notes (Signed)
Right flank pain since 130 this morning, hx of kidney stones. Denies any dysuria or fevers at home

## 2016-09-17 ENCOUNTER — Encounter: Payer: Self-pay | Admitting: Urology

## 2016-09-19 LAB — URINE CULTURE

## 2016-10-01 ENCOUNTER — Other Ambulatory Visit: Payer: Self-pay

## 2016-10-01 ENCOUNTER — Ambulatory Visit
Admission: RE | Admit: 2016-10-01 | Discharge: 2016-10-01 | Disposition: A | Discharge status: Home / Self Care | Payer: Medicare Other | Source: Ambulatory Visit | Attending: Orthopedic Surgery | Admitting: Orthopedic Surgery

## 2016-10-01 DIAGNOSIS — R001 Bradycardia, unspecified: Secondary | ICD-10-CM | POA: Insufficient documentation

## 2016-10-03 MED ORDER — LEVOFLOXACIN 500 MG PO TABS
500.0000 mg | ORAL_TABLET | ORAL | Status: AC
  Administered 2016-10-04: 500 mg via ORAL

## 2016-10-03 MED ORDER — CEFAZOLIN SODIUM-DEXTROSE 1-4 GM/50ML-% IV SOLN: 1.0000 g | Freq: Once | INTRAVENOUS | Status: DC

## 2016-10-04 ENCOUNTER — Encounter
Admission: RE | Disposition: A | Discharge status: Home / Self Care | Payer: Self-pay | Source: Ambulatory Visit | Attending: Urology

## 2016-10-04 ENCOUNTER — Encounter: Payer: Self-pay | Admitting: *Deleted

## 2016-10-04 ENCOUNTER — Ambulatory Visit
Admission: RE | Admit: 2016-10-04 | Discharge: 2016-10-04 | Disposition: A | Discharge status: Home / Self Care | Payer: Medicare Other | Source: Ambulatory Visit | Attending: Urology | Admitting: Urology

## 2016-10-04 DIAGNOSIS — I2510 Atherosclerotic heart disease of native coronary artery without angina pectoris: Secondary | ICD-10-CM | POA: Insufficient documentation

## 2016-10-04 DIAGNOSIS — Z7902 Long term (current) use of antithrombotics/antiplatelets: Secondary | ICD-10-CM | POA: Insufficient documentation

## 2016-10-04 DIAGNOSIS — Z955 Presence of coronary angioplasty implant and graft: Secondary | ICD-10-CM | POA: Insufficient documentation

## 2016-10-04 DIAGNOSIS — I10 Essential (primary) hypertension: Secondary | ICD-10-CM | POA: Insufficient documentation

## 2016-10-04 DIAGNOSIS — K219 Gastro-esophageal reflux disease without esophagitis: Secondary | ICD-10-CM | POA: Diagnosis not present

## 2016-10-04 DIAGNOSIS — N200 Calculus of kidney: Secondary | ICD-10-CM | POA: Insufficient documentation

## 2016-10-04 DIAGNOSIS — Z87891 Personal history of nicotine dependence: Secondary | ICD-10-CM | POA: Insufficient documentation

## 2016-10-04 DIAGNOSIS — Z79899 Other long term (current) drug therapy: Secondary | ICD-10-CM | POA: Insufficient documentation

## 2016-10-04 HISTORY — PX: EXTRACORPOREAL SHOCK WAVE LITHOTRIPSY: SHX1557

## 2016-10-04 SURGERY — LITHOTRIPSY, ESWL
Anesthesia: Moderate Sedation | Laterality: Right

## 2016-10-04 MED ORDER — MORPHINE SULFATE (PF) 10 MG/ML IV SOLN
10.0000 mg | Freq: Once | INTRAVENOUS | Status: AC
  Administered 2016-10-04: 10 mg via INTRAMUSCULAR

## 2016-10-04 MED ORDER — MIDAZOLAM HCL 2 MG/2ML IJ SOLN
1.0000 mg | Freq: Once | INTRAMUSCULAR | Status: AC
  Administered 2016-10-04: 1 mg via INTRAMUSCULAR

## 2016-10-04 MED ORDER — NUCYNTA 50 MG PO TABS: 50.0000 mg | ORAL_TABLET | Freq: Four times a day (QID) | ORAL | 0 refills | Status: DC | PRN

## 2016-10-04 MED ORDER — MIDAZOLAM HCL 2 MG/2ML IJ SOLN
INTRAMUSCULAR | Status: AC
  Filled 2016-10-04: qty 2

## 2016-10-04 MED ORDER — ONDANSETRON 8 MG PO TBDP: 4.0000 mg | ORAL_TABLET | Freq: Four times a day (QID) | ORAL | 3 refills | Status: DC | PRN

## 2016-10-04 MED ORDER — DIPHENHYDRAMINE HCL 25 MG PO CAPS
25.0000 mg | ORAL_CAPSULE | ORAL | Status: AC
  Administered 2016-10-04: 25 mg via ORAL

## 2016-10-04 MED ORDER — LEVOFLOXACIN 500 MG PO TABS
ORAL_TABLET | ORAL | Status: AC
  Filled 2016-10-04: qty 1

## 2016-10-04 MED ORDER — DEXTROSE-NACL 5-0.45 % IV SOLN
INTRAVENOUS | Status: DC
  Administered 2016-10-04: 11:00:00 via INTRAVENOUS

## 2016-10-04 MED ORDER — PROMETHAZINE HCL 25 MG/ML IJ SOLN
25.0000 mg | Freq: Once | INTRAMUSCULAR | Status: AC
  Administered 2016-10-04: 25 mg via INTRAMUSCULAR

## 2016-10-04 MED ORDER — PROMETHAZINE HCL 25 MG/ML IJ SOLN
INTRAMUSCULAR | Status: AC
  Filled 2016-10-04: qty 1

## 2016-10-04 MED ORDER — CIPROFLOXACIN HCL 500 MG PO TABS: 500.0000 mg | ORAL_TABLET | Freq: Two times a day (BID) | ORAL | 0 refills | Status: DC

## 2016-10-04 MED ORDER — MORPHINE SULFATE (PF) 10 MG/ML IV SOLN
INTRAVENOUS | Status: AC
  Filled 2016-10-04: qty 1

## 2016-10-04 MED ORDER — DIPHENHYDRAMINE HCL 25 MG PO CAPS
ORAL_CAPSULE | ORAL | Status: AC
  Filled 2016-10-04: qty 1

## 2016-10-04 MED ORDER — FUROSEMIDE 10 MG/ML IJ SOLN: 10.0000 mg | Freq: Once | INTRAMUSCULAR | Status: DC

## 2016-10-04 NOTE — Discharge Instructions (Signed)
Kidney Stones Kidney stones (urolithiasis) are solid, rock-like deposits that form inside of the organs that make urine (kidneys). A kidney stone may form in a kidney and move into the bladder, where it can cause intense pain and block the flow of urine. Kidney stones are created when high levels of certain minerals are found in the urine. They are usually passed through urination, but in some cases, medical treatment may be needed to remove them. What are the causes? Kidney stones may be caused by:  A condition in which certain glands produce too much parathyroid hormone (primary hyperparathyroidism), which causes too much calcium buildup in the blood.  Buildup of uric acid crystals in the bladder (hyperuricosuria). Uric acid is a chemical that the body produces when you eat certain foods. It usually exits the body in the urine.  Narrowing (stricture) of one or both of the tubes that drain urine from the kidneys to the bladder (ureters).  A kidney blockage that is present at birth (congenital obstruction).  Past surgery on the kidney or the ureters, such as gastric bypass surgery.  What increases the risk? The following factors make you more likely to develop kidney stones:  Having had a kidney stone in the past.  Having a family history of kidney stones.  Not drinking enough water.  Eating a diet that is high in protein, salt (sodium), or sugar.  Being overweight or obese.  What are the signs or symptoms? Symptoms of a kidney stone may include:  Nausea.  Vomiting.  Blood in the urine (hematuria).  Pain in the side of the abdomen, right below the ribs (flank pain). Pain usually spreads (radiates) to the groin.  Needing to urinate frequently or urgently.  How is this diagnosed? This condition may be diagnosed based on:  Your medical history.  A physical exam.  Blood tests.  Urine tests.  CT scan.  Abdominal X-ray.  A procedure to examine the inside of the  bladder (cystoscopy).  How is this treated? Treatment for kidney stones depends on the size, location, and makeup of the stones. Treatment may involve:  Analyzing your urine before and after you pass the stone through urination.  Being monitored at the hospital until you pass the stone through urination.  Increasing your fluid intake and decreasing the amount of calcium and protein in your diet.  A procedure to break up kidney stones in the bladder using: ? A focused beam of light (laser therapy). ? Shock waves (extracorporeal shock wave lithotripsy).  Surgery to remove kidney stones. This may be needed if you have severe pain or have stones that block your urinary tract.  Follow these instructions at home: Eating and drinking   Drink enough fluid to keep your urine clear or pale yellow. This will help you to pass the kidney stone.  If directed, change your diet. This may include: ? Limiting how much sodium you eat. ? Eating more fruits and vegetables. ? Limiting how much meat, poultry, fish, and eggs you eat.  Follow instructions from your health care provider about eating or drinking restrictions. General instructions  Collect urine samples as told by your health care provider. You may need to collect a urine sample: ? 24 hours after you pass the stone. ? 8-12 weeks after passing the kidney stone, and every 6-12 months after that.  Strain your urine every time you urinate, for as long as directed. Use the strainer that your health care provider recommends.  Do not throw out  the kidney stone after passing it. Keep the stone so it can be tested by your health care provider. Testing the makeup of your kidney stone may help prevent you from getting kidney stones in the future.  Take over-the-counter and prescription medicines only as told by your health care provider.  Keep all follow-up visits as told by your health care provider. This is important. You may need follow-up  X-rays or ultrasounds to make sure that your stone has passed. How is this prevented? To prevent another kidney stone:  Drink enough fluid to keep your urine clear or pale yellow. This is the best way to prevent kidney stones.  Eat a healthy diet and follow recommendations from your health care provider about foods to avoid. You may be instructed to eat a low-protein diet. Recommendations vary depending on the type of kidney stone that you have.  Maintain a healthy weight.  Contact a health care provider if:  You have pain that gets worse or does not get better with medicine. Get help right away if:  You have a fever or chills.  You develop severe pain.  You develop new abdominal pain.  You faint.  You are unable to urinate. This information is not intended to replace advice given to you by your health care provider. Make sure you discuss any questions you have with your health care provider. Document Released: 02/19/2005 Document Revised: 09/09/2015 Document Reviewed: 08/05/2015 Elsevier Interactive Patient Education  2017 [place] After This sheet gives you information about how to care for yourself after your procedure. Your health care provider may also give you more specific instructions. If you have problems or questions, contact your health care provider. What can I expect after the procedure? After the procedure, it is common to have:  Some blood in your urine. This should only last for a few days.  Soreness in your back, sides, or upper abdomen for a few days.  Blotches or bruises on your back where the pressure wave entered the skin.  Pain, discomfort, or nausea when pieces (fragments) of the kidney stone move through the tube that carries urine from the kidney to the bladder (ureter). Stone fragments may pass soon after the procedure, but they may continue to pass for up to 4-8 weeks. ? If you have severe pain or nausea, contact your  health care provider. This may be caused by a large stone that was not broken up, and this may mean that you need more treatment.  Some pain or discomfort during urination.  Some pain or discomfort in the lower abdomen or (in men) at the base of the penis.  Follow these instructions at home: Medicines  Take over-the-counter and prescription medicines only as told by your health care provider.  If you were prescribed an antibiotic medicine, take it as told by your health care provider. Do not stop taking the antibiotic even if you start to feel better.  Do not drive for 24 hours if you were given a medicine to help you relax (sedative).  Do not drive or use heavy machinery while taking prescription pain medicine. Eating and drinking  Drink enough water and fluids to keep your urine clear or pale yellow. This helps any remaining pieces of the stone to pass. It can also help prevent new stones from forming.  Eat plenty of fresh fruits and vegetables.  Follow instructions from your health care provider about eating and drinking restrictions. You may be instructed: ?  To reduce how much salt (sodium) you eat or drink. Check ingredients and nutrition facts on packaged foods and beverages. ? To reduce how much meat you eat.  Eat the recommended amount of calcium for your age and gender. Ask your health care provider how much calcium you should have. General instructions  Get plenty of rest.  Most people can resume normal activities 1-2 days after the procedure. Ask your health care provider what activities are safe for you.  If directed, strain all urine through the strainer that was provided by your health care provider. ? Keep all fragments for your health care provider to see. Any stones that are found may be sent to a medical lab for examination. The stone may be as small as a grain of salt.  Keep all follow-up visits as told by your health care provider. This is important. Contact a  health care provider if:  You have pain that is severe or does not get better with medicine.  You have nausea that is severe or does not go away.  You have blood in your urine longer than your health care provider told you to expect.  You have more blood in your urine.  You have pain during urination that does not go away.  You urinate more frequently than usual and this does not go away.  You develop a rash or any other possible signs of an allergic reaction. Get help right away if:  You have severe pain in your back, sides, or upper abdomen.  You have severe pain while urinating.  Your urine is very dark red.  You have blood in your stool (feces).  You cannot pass any urine at all.  You feel a strong urge to urinate after emptying your bladder.  You have a fever or chills.  You develop shortness of breath, difficulty breathing, or chest pain.  You have severe nausea that leads to persistent vomiting.  You faint. Summary  After this procedure, it is common to have some pain, discomfort, or nausea when pieces (fragments) of the kidney stone move through the tube that carries urine from the kidney to the bladder (ureter). If this pain or nausea is severe, however, you should contact your health care provider.  Most people can resume normal activities 1-2 days after the procedure. Ask your health care provider what activities are safe for you.  Drink enough water and fluids to keep your urine clear or pale yellow. This helps any remaining pieces of the stone to pass, and it can help prevent new stones from forming.  If directed, strain your urine and keep all fragments for your health care provider to see. Fragments or stones may be as small as a grain of salt.  Get help right away if you have severe pain in your back, sides, or upper abdomen or have severe pain while urinating. This information is not intended to replace advice given to you by your health care provider.  Make sure you discuss any questions you have with your health care provider. Document Released: 03/11/2007 Document Revised: 01/11/2016 Document Reviewed: 01/11/2016 Elsevier Interactive Patient Education  2017 [place].   Lithotripsy Lithotripsy is a treatment that can sometimes help eliminate kidney stones and the pain that they cause. A form of lithotripsy, also known as extracorporeal shock wave lithotripsy, is a nonsurgical procedure that crushes a kidney stone with shock waves. These shock waves pass through your body and focus on the kidney stone. They cause the  kidney stone to break up while it is still in the urinary tract. This makes it easier for the smaller pieces of stone to pass in the urine. Tell a health care provider about:  Any allergies you have.  All medicines you are taking, including vitamins, herbs, eye drops, creams, and over-the-counter medicines.  Any blood disorders you have.  Any surgeries you have had.  Any medical conditions you have.  Whether you are pregnant or may be pregnant.  Any problems you or family members have had with anesthetic medicines. What are the risks? Generally, this is a safe procedure. However, problems may occur, including:  Infection.  Bleeding of the kidney.  Bruising of the kidney or skin.  Scarring of the kidney, which can lead to: ? Increased blood pressure. ? Poor kidney function. ? Return (recurrence) of kidney stones.  Damage to other structures or organs, such as the liver, colon, spleen, or pancreas.  Blockage (obstruction) of the the tube that carries urine from the kidney to the bladder (ureter).  Failure of the kidney stone to break into pieces (fragments).  What happens before the procedure? Staying hydrated Follow instructions from your health care provider about hydration, which may include:  Up to 2 hours before the procedure - you may continue to drink clear liquids, such as water, clear fruit  juice, black coffee, and plain tea.  Eating and drinking restrictions Follow instructions from your health care provider about eating and drinking, which may include:  8 hours before the procedure - stop eating heavy meals or foods such as meat, fried foods, or fatty foods.  6 hours before the procedure - stop eating light meals or foods, such as toast or cereal.  6 hours before the procedure - stop drinking milk or drinks that contain milk.  2 hours before the procedure - stop drinking clear liquids.  General instructions  Plan to have someone take you home from the hospital or clinic.  Ask your health care provider about: ? Changing or stopping your regular medicines. This is especially important if you are taking diabetes medicines or blood thinners. ? Taking medicines such as aspirin and ibuprofen. These medicines and other NSAIDs can thin your blood. Do not take these medicines for 7 days before your procedure if your health care provider instructs you not to.  You may have tests, such as: ? Blood tests. ? Urine tests. ? Imaging tests, such as a CT scan. What happens during the procedure?  To lower your risk of infection: ? Your health care team will wash or sanitize their hands. ? Your skin will be washed with soap.  An IV tube will be inserted into one of your veins. This tube will give you fluids and medicines.  You will be given one or more of the following: ? A medicine to help you relax (sedative). ? A medicine to make you fall asleep (general anesthetic).  A water-filled cushion may be placed behind your kidney or on your abdomen. In some cases you may be placed in a tub of lukewarm water.  Your body will be positioned in a way that makes it easy to target the kidney stone.  A flexible tube with holes in it (stent) may be placed in the ureter. This will help keep urine flowing from the kidney if the fragments of the stone have been blocking the ureter.  An X-ray  or ultrasound exam will be done to locate your stone.  Shock waves will be  aimed at the stone. If you are awake, you may feel a tapping sensation as the shock waves pass through your body. The procedure may vary among health care providers and hospitals. What happens after the procedure?  You may have an X-ray to see whether the procedure was able to break up the kidney stone and how much of the stone has passed. If large stone fragments remain after treatment, you may need to have a second procedure at a later time.  Your blood pressure, heart rate, breathing rate, and blood oxygen level will be monitored until the medicines you were given have worn off.  You may be given antibiotics or pain medicine as needed.  If a stent was placed in your ureter during surgery, it may stay in place for a few weeks.  You may need strain your urine to collect pieces of the kidney stone for testing.  You will need to drink plenty of water.  Do not drive for 24 hours if you were given a sedative. Summary  Lithotripsy is a treatment that can sometimes help eliminate kidney stones and the pain that they cause.  A form of lithotripsy, also known as extracorporeal shock wave lithotripsy, is a nonsurgical procedure that crushes a kidney stone with shock waves.  Generally, this is a safe procedure. However, problems may occur, including damage to the kidney or other organs, infection, or obstruction of the tube that carries urine from the kidney to the bladder (ureter).  When you go home, you will need to drink plenty of water. You may be asked to strain your urine to collect pieces of the kidney stone for testing. This information is not intended to replace advice given to you by your health care provider. Make sure you discuss any questions you have with your health care provider. Document Released: 02/17/2000 Document Revised: 01/11/2016 Document Reviewed: 01/11/2016 Elsevier Interactive Patient Education   2017 [employer].

## 2016-10-04 NOTE — OR Nursing (Signed)
Discussed MVI and folic acid preo with Valetta Close - aware pt took both this am, advises ok to proceed

## 2016-10-05 ENCOUNTER — Encounter: Payer: Self-pay | Admitting: Urology

## 2016-10-30 ENCOUNTER — Ambulatory Visit (INDEPENDENT_AMBULATORY_CARE_PROVIDER_SITE_OTHER): Payer: Medicare Other

## 2016-10-30 ENCOUNTER — Encounter: Payer: Self-pay | Admitting: Family Medicine

## 2016-10-30 ENCOUNTER — Ambulatory Visit: Payer: Medicare Other | Admitting: Family Medicine

## 2016-10-30 VITALS — BP 146/84 | HR 60 | Temp 98.2°F | Ht 68.0 in | Wt 194.8 lb

## 2016-10-30 DIAGNOSIS — Z1159 Encounter for screening for other viral diseases: Secondary | ICD-10-CM | POA: Diagnosis not present

## 2016-10-30 DIAGNOSIS — N200 Calculus of kidney: Secondary | ICD-10-CM | POA: Diagnosis not present

## 2016-10-30 DIAGNOSIS — I10 Essential (primary) hypertension: Secondary | ICD-10-CM

## 2016-10-30 DIAGNOSIS — Z23 Encounter for immunization: Secondary | ICD-10-CM

## 2016-10-30 DIAGNOSIS — I252 Old myocardial infarction: Secondary | ICD-10-CM

## 2016-10-30 DIAGNOSIS — E782 Mixed hyperlipidemia: Secondary | ICD-10-CM

## 2016-10-30 DIAGNOSIS — Z0000 Encounter for general adult medical examination without abnormal findings: Secondary | ICD-10-CM | POA: Diagnosis not present

## 2016-10-30 NOTE — Patient Instructions (Signed)
[name] , Thank you for taking time to come for your Medicare Wellness Visit. I appreciate your ongoing commitment to your health goals. Please review the following plan we discussed and let me know if I can assist you in the future.   Screening recommendations/referrals: Colonoscopy: up to date Recommended yearly ophthalmology/optometry visit for glaucoma screening and checkup Recommended yearly dental visit for hygiene and checkup  Vaccinations: Influenza vaccine: completed today Pneumococcal vaccine: completed series Tdap vaccine: up to date, due 09/2019 Shingles vaccine: completed 04/02/11  Advanced directives: Advance directive discussed with you today. I have provided a copy for you to complete at home and have notarized. Once this is complete please bring a copy in to our office so we can scan it into your chart.  Conditions/risks identified: Continue drinking 6-8 glasses of water a day.  Next appointment: None, need to schedule 1 year AWV  Preventive Care 58 Years and Older, Male Preventive care refers to lifestyle choices and visits with your health care provider that can promote health and wellness. What does preventive care include?  A yearly physical exam. This is also called an annual well check.  Dental exams once or twice a year.  Routine eye exams. Ask your health care provider how often you should have your eyes checked.  Personal lifestyle choices, including:  Daily care of your teeth and gums.  Regular physical activity.  Eating a healthy diet.  Avoiding tobacco and drug use.  Limiting alcohol use.  Practicing safe sex.  Taking low doses of aspirin every day.  Taking vitamin and mineral supplements as recommended by your health care provider. What happens during an annual well check? The services and screenings done by your health care provider during your annual well check will depend on your age, overall health, lifestyle risk factors, and  family history of disease. Counseling  Your health care provider may ask you questions about your:  Alcohol use.  Tobacco use.  Drug use.  Emotional well-being.  Home and relationship well-being.  Sexual activity.  Eating habits.  History of falls.  Memory and ability to understand (cognition).  Work and work Statistician. Screening  You may have the following tests or measurements:  Height, weight, and BMI.  Blood pressure.  Lipid and cholesterol levels. These may be checked every 5 years, or more frequently if you are over 7 years old.  Skin check.  Lung cancer screening. You may have this screening every year starting at age 72 if you have a 30-pack-year history of smoking and currently smoke or have quit within the past 15 years.  Fecal occult blood test (FOBT) of the stool. You may have this test every year starting at age 72.  Flexible sigmoidoscopy or colonoscopy. You may have a sigmoidoscopy every 5 years or a colonoscopy every 10 years starting at age 72.  Prostate cancer screening. Recommendations will vary depending on your family history and other risks.  Hepatitis C blood test.  Hepatitis B blood test.  Sexually transmitted disease (STD) testing.  Diabetes screening. This is done by checking your blood sugar (glucose) after you have not eaten for a while (fasting). You may have this done every 1-3 years.  Abdominal aortic aneurysm (AAA) screening. You may need this if you are a current or former smoker.  Osteoporosis. You may be screened starting at age 15 if you are at high risk. Talk with your health care provider about your test results, treatment options, and if necessary, the need  for more tests. Vaccines  Your health care provider may recommend certain vaccines, such as:  Influenza vaccine. This is recommended every year.  Tetanus, diphtheria, and acellular pertussis (Tdap, Td) vaccine. You may need a Td booster every 10 years.  Zoster  vaccine. You may need this after age 72.  Pneumococcal 13-valent conjugate (PCV13) vaccine. One dose is recommended after age 72.  Pneumococcal polysaccharide (PPSV23) vaccine. One dose is recommended after age 72. Talk to your health care provider about which screenings and vaccines you need and how often you need them. This information is not intended to replace advice given to you by your health care provider. Make sure you discuss any questions you have with your health care provider. Document Released: 03/18/2015 Document Revised: 11/09/2015 Document Reviewed: 12/21/2014 Elsevier Interactive Patient Education  2017 [place] Prevention in the Home Falls can cause injuries. They can happen to people of all ages. There are many things you can do to make your home safe and to help prevent falls. What can I do on the outside of my home?  Regularly fix the edges of walkways and driveways and fix any cracks.  Remove anything that might make you trip as you walk through a door, such as a raised step or threshold.  Trim any bushes or trees on the path to your home.  Use bright outdoor lighting.  Clear any walking paths of anything that might make someone trip, such as rocks or tools.  Regularly check to see if handrails are loose or broken. Make sure that both sides of any steps have handrails.  Any raised decks and porches should have guardrails on the edges.  Have any leaves, snow, or ice cleared regularly.  Use sand or salt on walking paths during winter.  Clean up any spills in your garage right away. This includes oil or grease spills. What can I do in the bathroom?  Use night lights.  Install grab bars by the toilet and in the tub and shower. Do not use towel bars as grab bars.  Use non-skid mats or decals in the tub or shower.  If you need to sit down in the shower, use a plastic, non-slip stool.  Keep the floor dry. Clean up any water that spills on the  floor as soon as it happens.  Remove soap buildup in the tub or shower regularly.  Attach bath mats securely with double-sided non-slip rug tape.  Do not have throw rugs and other things on the floor that can make you trip. What can I do in the bedroom?  Use night lights.  Make sure that you have a light by your bed that is easy to reach.  Do not use any sheets or blankets that are too big for your bed. They should not hang down onto the floor.  Have a firm chair that has side arms. You can use this for support while you get dressed.  Do not have throw rugs and other things on the floor that can make you trip. What can I do in the kitchen?  Clean up any spills right away.  Avoid walking on wet floors.  Keep items that you use a lot in easy-to-reach places.  If you need to reach something above you, use a strong step stool that has a grab bar.  Keep electrical cords out of the way.  Do not use floor polish or wax that makes floors slippery. If you must use wax, use  non-skid floor wax.  Do not have throw rugs and other things on the floor that can make you trip. What can I do with my stairs?  Do not leave any items on the stairs.  Make sure that there are handrails on both sides of the stairs and use them. Fix handrails that are broken or loose. Make sure that handrails are as long as the stairways.  Check any carpeting to make sure that it is firmly attached to the stairs. Fix any carpet that is loose or worn.  Avoid having throw rugs at the top or bottom of the stairs. If you do have throw rugs, attach them to the floor with carpet tape.  Make sure that you have a light switch at the top of the stairs and the bottom of the stairs. If you do not have them, ask someone to add them for you. What else can I do to help prevent falls?  Wear shoes that:  Do not have high heels.  Have rubber bottoms.  Are comfortable and fit you well.  Are closed at the toe. Do not wear  sandals.  If you use a stepladder:  Make sure that it is fully opened. Do not climb a closed stepladder.  Make sure that both sides of the stepladder are locked into place.  Ask someone to hold it for you, if possible.  Clearly mark and make sure that you can see:  Any grab bars or handrails.  First and last steps.  Where the edge of each step is.  Use tools that help you move around (mobility aids) if they are needed. These include:  Canes.  Walkers.  Scooters.  Crutches.  Turn on the lights when you go into a dark area. Replace any light bulbs as soon as they burn out.  Set up your furniture so you have a clear path. Avoid moving your furniture around.  If any of your floors are uneven, fix them.  If there are any pets around you, be aware of where they are.  Review your medicines with your doctor. Some medicines can make you feel dizzy. This can increase your chance of falling. Ask your doctor what other things that you can do to help prevent falls. This information is not intended to replace advice given to you by your health care provider. Make sure you discuss any questions you have with your health care provider. Document Released: 12/16/2008 Document Revised: 07/28/2015 Document Reviewed: 03/26/2014 Elsevier Interactive Patient Education  2017 [employer].

## 2016-10-30 NOTE — Progress Notes (Signed)
Subjective:   Luke Rojas is a 71 y.o. male who presents for Medicare Annual/Subsequent preventive examination.  Review of Systems:  N/A Cardiac Risk Factors include: advanced age (>77men, >35 women);dyslipidemia;hypertension;male gender     Objective:    Vitals: BP (!) 146/84 (BP Location: Left Arm)   Pulse 60   Temp 98.2 F (36.8 C) (Oral)   Ht 5\' 8"  (1.727 m)   Wt 194 lb 12.8 oz (88.4 kg)   BMI 29.62 kg/m   Body mass index is 29.62 kg/m.  Tobacco History  Smoking Status  . Former Smoker  . Packs/day: 1.00  . Years: 30.00  . Types: Cigarettes  . Quit date: 03/05/1984  Smokeless Tobacco  . Current User  . Types: Chew     Ready to quit: Not Answered Counseling given: Not Answered   Past Medical History:  Diagnosis Date  . Chronic kidney disease    kidney stones  . Coronary artery disease    2 stents   . GERD (gastroesophageal reflux disease)   . Hypercholesteremia   . Hypertension   . Myocardial infarction [facility]) 2013   Past Surgical History:  Procedure Laterality Date  . CARDIAC CATHETERIZATION  2013   2 stents  . COLONOSCOPY  04/27/05  . COLONOSCOPY WITH PROPOFOL N/A 06/08/2015   Procedure: COLONOSCOPY WITH PROPOFOL;  Surgeon: Robert Bellow, MD;  Location: [facility] ENDOSCOPY;  Service: Endoscopy;  Laterality: N/A;  . CYSTOSCOPY WITH STENT PLACEMENT Right 09/16/2016   Procedure: CYSTOSCOPY WITH STENT PLACEMENT;  Surgeon: Royston Cowper, MD;  Location: [facility] ORS;  Service: Urology;  Laterality: Right;  . EXTRACORPOREAL SHOCK WAVE LITHOTRIPSY Left 11/04/2014   has had 2 previous lithotripsies  . EXTRACORPOREAL SHOCK WAVE LITHOTRIPSY Left 03/24/2015   Procedure: EXTRACORPOREAL SHOCK WAVE LITHOTRIPSY (ESWL);  Surgeon: Royston Cowper, MD;  Location: [facility] ORS;  Service: Urology;  Laterality: Left;  . EXTRACORPOREAL SHOCK WAVE LITHOTRIPSY Right 10/04/2016   Procedure: EXTRACORPOREAL SHOCK WAVE LITHOTRIPSY (ESWL);  Surgeon: Royston Cowper, MD;  Location: [facility]  ORS;  Service: Urology;  Laterality: Right;   Family History  Problem Relation Age of Onset  . Pulmonary embolism Mother   . Transient ischemic attack Mother   . Pancreatic cancer Father   . Hypertension Father   . Cirrhosis Brother    History  Sexual Activity  . Sexual activity: Not on file    Outpatient Encounter Prescriptions as of 10/30/2016  Medication Sig  . atorvastatin (LIPITOR) 40 MG tablet Take 1 tablet by mouth daily.  . cyclobenzaprine (FLEXERIL) 5 MG tablet Take 1 tablet (5 mg total) by mouth 3 (three) times daily as needed for muscle spasms.  Marland Kitchen docusate sodium (COLACE) 100 MG capsule Take 2 capsules (200 mg total) by mouth 2 (two) times daily.  . finasteride (PROSCAR) 5 MG tablet Take 5 mg by mouth every evening.  Marland Kitchen HYDROcodone-acetaminophen (NORCO) 7.5-325 MG tablet Take 1-2 tablets by mouth every 4 (four) hours as needed for moderate pain. Maximum dose per 24 hours - 8 pills  . metoprolol (LOPRESSOR) 50 MG tablet Take 50 mg by mouth 2 (two) times daily. Morning and night  . MULTIPLE VITAMIN PO Take 1 tablet by mouth daily.  . nitroGLYCERIN (NITROSTAT) 0.4 MG SL tablet Place 0.4 mg under the tongue every 5 (five) minutes as needed. Reported on 03/24/2015  . NUCYNTA 50 MG tablet Take 1 tablet (50 mg total) by mouth every 6 (six) hours as needed for moderate pain. 1 TO 2  TABS Q 6 HOURS PRN PAIN  . Omega-3 Fatty Acids (FISH OIL) 1200 MG CAPS Take 1 capsule by mouth 2 (two) times daily.  Marland Kitchen omeprazole (PRILOSEC) 20 MG capsule Take 20 mg by mouth daily.   . ondansetron (ZOFRAN ODT) 8 MG disintegrating tablet Take 0.5 tablets (4 mg total) by mouth every 6 (six) hours as needed for nausea or vomiting.  . ondansetron (ZOFRAN) 4 MG tablet Take 1 tablet (4 mg total) by mouth daily as needed for nausea or vomiting.  . phenazopyridine (PYRIDIUM) 200 MG tablet Take 1 tablet (200 mg total) by mouth 3 (three) times daily as needed for pain. (Patient not taking: Reported on 09/16/2016)  .  ramipril (ALTACE) 10 MG capsule Take 1 capsule by mouth daily.  . tamsulosin (FLOMAX) 0.4 MG CAPS capsule Take 1 capsule (0.4 mg total) by mouth daily after breakfast.  . [DISCONTINUED] ciprofloxacin (CIPRO) 500 MG tablet Take 1 tablet (500 mg total) by mouth 2 (two) times daily.   No facility-administered encounter medications on file as of 10/30/2016.     Activities of Daily Living In your present state of health, do you have any difficulty performing the following activities: 10/30/2016 10/31/2015  Hearing? N N  Vision? Y N  Comment having cataract sx in the future -  Difficulty concentrating or making decisions? N N  Walking or climbing stairs? N N  Dressing or bathing? N N  Doing errands, shopping? N N  Preparing Food and eating ? N -  Using the Toilet? N -  In the past six months, have you accidently leaked urine? N -  Do you have problems with loss of bowel control? N -  Managing your Medications? N -  Managing your Finances? N -  Housekeeping or managing your Housekeeping? N -  Some recent data might be hidden    Patient Care Team: Chrismon, Vickki Muff, PA as PCP - General (Physician Assistant)   Assessment:     Exercise Activities and Dietary recommendations Current Exercise Habits: The patient does not participate in regular exercise at present, Exercise limited by: None identified  Goals    None     Fall Risk Fall Risk  10/30/2016 10/31/2015  Falls in the past year? No No   Depression Screen PHQ 2/9 Scores 10/30/2016 10/31/2015  PHQ - 2 Score 0 0    Cognitive Function- Pt declined screening today.        Immunization History  Administered Date(s) Administered  . Influenza Split 03/10/2010, 03/11/2012  . Influenza, High Dose Seasonal PF 01/07/2014, 10/31/2015  . Influenza,inj,Quad PF,6+ Mos 12/09/2012  . Pneumococcal Conjugate-13 01/07/2014  . Pneumococcal Polysaccharide-23 03/31/2015  . Tdap 09/23/2009  . Zoster 04/02/2011   Screening Tests Health  Maintenance  Topic Date Due  . Hepatitis C Screening  03-23-1945  . INFLUENZA VACCINE  10/03/2016  . TETANUS/TDAP  09/24/2019  . COLONOSCOPY  06/07/2025  . PNA vac Low Risk Adult  Completed      Plan:  I have personally reviewed and addressed the Medicare Annual Wellness questionnaire and have noted the following in the patient's chart:  A. Medical and social history B. Use of alcohol, tobacco or illicit drugs  C. Current medications and supplements D. Functional ability and status E.  Nutritional status F.  Physical activity G. Advance directives H. List of other physicians I.  Hospitalizations, surgeries, and ER visits in previous 12 months J.  [place] such as hearing and vision if needed, cognitive and  depression L. Referrals and appointments - none  In addition, I have reviewed and discussed with patient certain preventive protocols, quality metrics, and best practice recommendations. A written personalized care plan for preventive services as well as general preventive health recommendations were provided to patient.  See attached scanned questionnaire for additional information.   Signed,  Fabio Neighbors, LPN Nurse Health Advisor   MD Recommendations: None.  Reviewed documentation of wellness screening by Nurse Health Advisor. Agree with recommendations and reviewed with patient today.

## 2016-10-30 NOTE — Progress Notes (Signed)
Patient: Luke Rojas, Male    DOB: October 31, 1945, 71 y.o.   MRN: [id number] Visit Date: 10/30/2016  Today's Provider: Vernie Murders, PA   Chief Complaint  Patient presents with  . Annual Exam   Subjective:    Annual physical exam Luke Rojas is a 71 y.o. male who presents today for health maintenance and complete physical. He feels well. He reports exercising none. He reports he is sleeping well.  -----------------------------------------------------------------   Review of Systems  Constitutional: Negative.   HENT: Negative.   Eyes: Negative.   Respiratory: Negative.   Cardiovascular: Negative.   Gastrointestinal: Negative.   Endocrine: Negative.   Genitourinary: Negative.   Musculoskeletal: Negative.   Skin: Negative.   Allergic/Immunologic: Negative.   Neurological: Negative.   Hematological: Negative.   Psychiatric/Behavioral: Negative.     Social History      He  reports that he quit smoking about 32 years ago. His smoking use included Cigarettes. He has a 30.00 pack-year smoking history. His smokeless tobacco use includes Chew. He reports that he does not drink alcohol or use drugs.       Social History   Social History  . Marital status: Married    Spouse name: N/A  . Number of children: N/A  . Years of education: N/A   Social History Main Topics  . Smoking status: Former Smoker    Packs/day: 1.00    Years: 30.00    Types: Cigarettes    Quit date: 03/05/1984  . Smokeless tobacco: Current User    Types: Chew  . Alcohol use No  . Drug use: No  . Sexual activity: Not on file   Other Topics Concern  . Not on file   Social History Narrative  . No narrative on file    Past Medical History:  Diagnosis Date  . Chronic kidney disease    kidney stones  . Coronary artery disease    2 stents   . GERD (gastroesophageal reflux disease)   . Hypercholesteremia   . Hypertension   . Myocardial infarction [facility]) 2013     Patient  Active Problem List   Diagnosis Date Noted  . Encounter for screening colonoscopy 05/04/2015  . Arthritis 10/08/2014  . H/O acute myocardial infarction 10/08/2014  . Acid reflux 10/08/2014  . HLD (hyperlipidemia) 10/08/2014  . BP (high blood pressure) 10/08/2014  . 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 Benign neoplasm of skin 07/19/2006  . Essential (primary) hypertension 11/15/2005  . Decreased potassium in the blood 11/15/2005    Past Surgical History:  Procedure Laterality Date  . CARDIAC CATHETERIZATION  2013   2 stents  . COLONOSCOPY  04/27/05  . COLONOSCOPY WITH PROPOFOL N/A 06/08/2015   Procedure: COLONOSCOPY WITH PROPOFOL;  Surgeon: Robert Bellow, MD;  Location: [facility] ENDOSCOPY;  Service: Endoscopy;  Laterality: N/A;  . CYSTOSCOPY WITH STENT PLACEMENT Right 09/16/2016   Procedure: CYSTOSCOPY WITH STENT PLACEMENT;  Surgeon: Royston Cowper, MD;  Location: [facility] ORS;  Service: Urology;  Laterality: Right;  . EXTRACORPOREAL SHOCK WAVE LITHOTRIPSY Left 11/04/2014   has had 2 previous lithotripsies  . EXTRACORPOREAL SHOCK WAVE LITHOTRIPSY Left 03/24/2015   Procedure: EXTRACORPOREAL SHOCK WAVE LITHOTRIPSY (ESWL);  Surgeon: Royston Cowper, MD;  Location: [facility] ORS;  Service: Urology;  Laterality: Left;  . EXTRACORPOREAL SHOCK WAVE LITHOTRIPSY Right 10/04/2016   Procedure: EXTRACORPOREAL SHOCK WAVE LITHOTRIPSY (ESWL);  Surgeon: Royston Cowper, MD;  Location: [facility] ORS;  Service: Urology;  Laterality: Right;    Family History        Family Status  Relation Status  . Mother Deceased at age 28  . Father Deceased at age 78  . Sister Alive  . Brother Deceased       POISIONING  . Brother Deceased  . Brother Deceased at age 38       MENINGITIS        His family history includes Cirrhosis in his brother; Hypertension in his father; Pancreatic cancer in his father; Pulmonary embolism in his mother; Transient ischemic attack in his mother.     Allergies    Allergen Reactions  . Codeine Nausea Only    Current Outpatient Prescriptions:  .  atorvastatin (LIPITOR) 40 MG tablet, Take 1 tablet by mouth daily., Disp: , Rfl:  .  cyclobenzaprine (FLEXERIL) 5 MG tablet, Take 1 tablet (5 mg total) by mouth 3 (three) times daily as needed for muscle spasms., Disp: 30 tablet, Rfl: 1 .  docusate sodium (COLACE) 100 MG capsule, Take 2 capsules (200 mg total) by mouth 2 (two) times daily., Disp: 120 capsule, Rfl: 3 .  finasteride (PROSCAR) 5 MG tablet, Take 5 mg by mouth every evening., Disp: , Rfl:  .  HYDROcodone-acetaminophen (NORCO) 7.5-325 MG tablet, Take 1-2 tablets by mouth every 4 (four) hours as needed for moderate pain. Maximum dose per 24 hours - 8 pills, Disp: 20 tablet, Rfl: 0 .  metoprolol (LOPRESSOR) 50 MG tablet, Take 50 mg by mouth 2 (two) times daily. Morning and night, Disp: , Rfl:  .  MULTIPLE VITAMIN PO, Take 1 tablet by mouth daily., Disp: , Rfl:  .  nitroGLYCERIN (NITROSTAT) 0.4 MG SL tablet, Place 0.4 mg under the tongue every 5 (five) minutes as needed. Reported on 03/24/2015, Disp: , Rfl:  .  NUCYNTA 50 MG tablet, Take 1 tablet (50 mg total) by mouth every 6 (six) hours as needed for moderate pain. 1 TO 2 TABS Q 6 HOURS PRN PAIN, Disp: 20 tablet, Rfl: 0 .  Omega-3 Fatty Acids (FISH OIL) 1200 MG CAPS, Take 1 capsule by mouth 2 (two) times daily., Disp: , Rfl:  .  omeprazole (PRILOSEC) 20 MG capsule, Take 20 mg by mouth daily. , Disp: , Rfl:  .  ondansetron (ZOFRAN ODT) 8 MG disintegrating tablet, Take 0.5 tablets (4 mg total) by mouth every 6 (six) hours as needed for nausea or vomiting., Disp: 4 tablet, Rfl: 3 .  ondansetron (ZOFRAN) 4 MG tablet, Take 1 tablet (4 mg total) by mouth daily as needed for nausea or vomiting., Disp: 30 tablet, Rfl: 1 .  phenazopyridine (PYRIDIUM) 200 MG tablet, Take 1 tablet (200 mg total) by mouth 3 (three) times daily as needed for pain., Disp: 12 tablet, Rfl: 0 .  ramipril (ALTACE) 10 MG capsule, Take 1  capsule by mouth daily., Disp: , Rfl:  .  tamsulosin (FLOMAX) 0.4 MG CAPS capsule, Take 1 capsule (0.4 mg total) by mouth daily after breakfast., Disp: 14 capsule, Rfl: 0   Patient Care Team: Chrismon, Vickki Muff, PA as PCP - General (Physician Assistant) Thelma Comp, [place] as Consulting Physician (Optometry) Royston Cowper, MD as Consulting Physician (Urology)      Objective:   Vitals: BP (!) 146/84   Pulse 60   Temp 98.2 F (36.8 C) (Oral)   Ht 5\' 8"  (1.727 m)   Wt 194 lb 12.8 oz (88.4 kg)   BMI 29.62 kg/m    Vitals:  10/30/16 0910  BP: (!) 146/84  Pulse: 60  Temp: 98.2 F (36.8 C)  TempSrc: Oral  Weight: 194 lb 12.8 oz (88.4 kg)  Height: 5\' 8"  (1.727 m)     Physical Exam  Constitutional: He is oriented to person, place, and time. He appears well-developed and well-nourished.  HENT:  Head: Normocephalic and atraumatic.  Right Ear: External ear normal.  Left Ear: External ear normal.  Nose: Nose normal.  Mouth/Throat: Oropharynx is clear and moist.  Eyes: Pupils are equal, round, and reactive to light. Conjunctivae and EOM are normal. Right eye exhibits no discharge.  Neck: Normal range of motion. Neck supple. No tracheal deviation present. No thyromegaly present.  Cardiovascular: Normal rate, regular rhythm, normal heart sounds and intact distal pulses.   No murmur heard. Pulmonary/Chest: Effort normal and breath sounds normal. No respiratory distress. He has no wheezes. He has no rales. He exhibits no tenderness.  Abdominal: Soft. He exhibits no distension and no mass. There is no tenderness. There is no rebound and no guarding.  Genitourinary:  Genitourinary Comments: Exam deferred. Recent follow up with Dr. Yves Dill for follow up of renal stones and BPH.  Musculoskeletal: Normal range of motion. He exhibits no edema or tenderness.  Lymphadenopathy:    He has no cervical adenopathy.  Neurological: He is alert and oriented to person, place, and time. He has  normal reflexes. No cranial nerve deficit. He exhibits normal muscle tone. Coordination normal.  Skin: Skin is warm and dry. No rash noted. No erythema.  Psychiatric: He has a normal mood and affect. His behavior is normal. Judgment and thought content normal.     Depression Screen PHQ 2/9 Scores 10/30/2016 10/31/2015  PHQ - 2 Score 0 0      Assessment & Plan:     Routine Health Maintenance and Physical Exam  Exercise Activities and Dietary recommendations Goals    . Increase water intake          Continue drinking 6-8 glasses of water a day. Pt declined diet or exercise change.       Immunization History  Administered Date(s) Administered  . Influenza Split 03/10/2010, 03/11/2012  . Influenza, High Dose Seasonal PF 01/07/2014, 10/31/2015, 10/30/2016  . Influenza,inj,Quad PF,6+ Mos 12/09/2012  . Pneumococcal Conjugate-13 01/07/2014  . Pneumococcal Polysaccharide-23 03/31/2015  . Tdap 09/23/2009  . Zoster 04/02/2011    Health Maintenance  Topic Date Due  . Samul Dada  09/24/2019  . COLONOSCOPY  06/07/2025  . INFLUENZA VACCINE  Completed  . Hepatitis C Screening  Completed  . PNA vac Low Risk Adult  Completed     Discussed health benefits of physical activity, and encouraged him to engage in regular exercise appropriate for his age and condition.    -------------------------------------------------------------------- 1. Encounter for Medicare annual wellness exam General health good. Immunizations up to date. Recommend flu shot this Fall. Colonoscopy done 06-07-15 without significant pathology.  2. Essential (primary) hypertension Stable on the Altace and Metoprolol without side effects. Recheck labs and follow up pending reports. - CBC with Differential/Platelet - Comprehensive metabolic panel - Lipid panel - TSH  3. H/O acute myocardial infarction No longer needing any Nitrostat since MI with placement of 2 stents in 2013. No recent chest pains, diaphoresis  or dyspnea. Recheck lipids and follow up annually with Dr. Clayborn Bigness (cardiologist).  - Lipid panel  4. Combined fat and carbohydrate induced hyperlipemia Trying to follow low fat diet and exercising more by working outdoors with a friend. No longer  taking statin or fish oil supplement. Follow up pending report of CMP,TSH and lipid panel. - Comprehensive metabolic panel - Lipid panel - TSH  5. Nephrolithiasis Followed by Dr. Yves Dill without recurrence of pain or hematuria recently. Still taking Flomax to help clear remains of lithotripsy. Recheck CBC and CMP to assess kidney function. - CBC with Differential/Platelet - Comprehensive metabolic panel    Vernie Murders, PA  [facility]

## 2016-11-07 LAB — LIPID PANEL
Chol/HDL Ratio: 3.5 ratio (ref 0.0–5.0)
Cholesterol, Total: 102 mg/dL (ref 100–199)
HDL: 29 mg/dL — AB (ref >39)
LDL Calculated: 42 mg/dL (ref 0–99)
Triglycerides: 155 mg/dL — ABNORMAL HIGH (ref 0–149)
VLDL CHOLESTEROL CAL: 31 mg/dL (ref 5–40)

## 2016-11-07 LAB — COMPREHENSIVE METABOLIC PANEL
ALT: 23 IU/L (ref 0–44)
AST: 22 IU/L (ref 0–40)
Albumin/Globulin Ratio: 1.6 (ref 1.2–2.2)
Albumin: 4.4 g/dL (ref 3.5–4.8)
Alkaline Phosphatase: 71 IU/L (ref 39–117)
BILIRUBIN TOTAL: 0.4 mg/dL (ref 0.0–1.2)
BUN/Creatinine Ratio: 13 (ref 10–24)
BUN: 16 mg/dL (ref 8–27)
CALCIUM: 9.6 mg/dL (ref 8.6–10.2)
CHLORIDE: 106 mmol/L (ref 96–106)
CO2: 19 mmol/L — ABNORMAL LOW (ref 20–29)
Creatinine, Ser: 1.21 mg/dL (ref 0.76–1.27)
GFR calc non Af Amer: 60 mL/min/{1.73_m2} (ref >59)
GFR, EST AFRICAN AMERICAN: 69 mL/min/{1.73_m2} (ref >59)
GLUCOSE: 129 mg/dL — AB (ref 65–99)
Globulin, Total: 2.8 g/dL (ref 1.5–4.5)
Potassium: 4.4 mmol/L (ref 3.5–5.2)
Sodium: 140 mmol/L (ref 134–144)
TOTAL PROTEIN: 7.2 g/dL (ref 6.0–8.5)

## 2016-11-07 LAB — CBC WITH DIFFERENTIAL/PLATELET
BASOS ABS: 0 10*3/uL (ref 0.0–0.2)
Basos: 1 %
EOS (ABSOLUTE): 0.3 10*3/uL (ref 0.0–0.4)
Eos: 5 %
Hematocrit: 41.2 % (ref 37.5–51.0)
Hemoglobin: 14.3 g/dL (ref 13.0–17.7)
IMMATURE GRANS (ABS): 0 10*3/uL (ref 0.0–0.1)
IMMATURE GRANULOCYTES: 0 %
LYMPHS: 32 %
Lymphocytes Absolute: 1.8 10*3/uL (ref 0.7–3.1)
MCH: 29.7 pg (ref 26.6–33.0)
MCHC: 34.7 g/dL (ref 31.5–35.7)
MCV: 86 fL (ref 79–97)
Monocytes Absolute: 0.5 10*3/uL (ref 0.1–0.9)
Monocytes: 9 %
NEUTROS PCT: 53 %
Neutrophils Absolute: 3.1 10*3/uL (ref 1.4–7.0)
PLATELETS: 156 10*3/uL (ref 150–379)
RBC: 4.82 x10E6/uL (ref 4.14–5.80)
RDW: 15.9 % — ABNORMAL HIGH (ref 12.3–15.4)
WBC: 5.6 10*3/uL (ref 3.4–10.8)

## 2016-11-07 LAB — TSH: TSH: 4.22 u[IU]/mL (ref 0.450–4.500)

## 2016-11-07 LAB — HEPATITIS C ANTIBODY: Hep C Virus Ab: 0.2 s/co ratio (ref 0.0–0.9)

## 2016-11-08 ENCOUNTER — Telehealth: Payer: Self-pay

## 2016-11-08 NOTE — Telephone Encounter (Signed)
Pt advised.   Thanks,   -Laura  

## 2016-11-08 NOTE — Telephone Encounter (Signed)
-----   Message from Margo Common, [place] sent at 11/08/2016  8:04 AM EDT ----- Triglycerides slightly elevated and HDL cholesterol very low. Recommend getting back on the Atorvastatin regularly, add Co-Q 10 qd and lower fats in diet. Recheck progress in 3 months.

## 2016-11-17 ENCOUNTER — Other Ambulatory Visit: Payer: Self-pay | Admitting: Family Medicine

## 2016-11-17 DIAGNOSIS — M47819 Spondylosis without myelopathy or radiculopathy, site unspecified: Secondary | ICD-10-CM

## 2017-03-21 ENCOUNTER — Other Ambulatory Visit: Payer: Self-pay | Admitting: Family Medicine

## 2017-03-21 DIAGNOSIS — M47819 Spondylosis without myelopathy or radiculopathy, site unspecified: Secondary | ICD-10-CM

## 2017-03-22 ENCOUNTER — Ambulatory Visit
Admission: RE | Admit: 2017-03-22 | Discharge: 2017-03-22 | Disposition: A | Discharge status: Home / Self Care | Payer: Medicare Other | Source: Ambulatory Visit | Attending: Family Medicine | Admitting: Family Medicine

## 2017-03-22 ENCOUNTER — Ambulatory Visit (INDEPENDENT_AMBULATORY_CARE_PROVIDER_SITE_OTHER): Payer: Medicare Other | Admitting: Family Medicine

## 2017-03-22 ENCOUNTER — Encounter: Payer: Self-pay | Admitting: Family Medicine

## 2017-03-22 ENCOUNTER — Telehealth: Payer: Self-pay

## 2017-03-22 VITALS — BP 136/62 | HR 57 | Temp 98.2°F | Wt 194.2 lb

## 2017-03-22 DIAGNOSIS — M5137 Other intervertebral disc degeneration, lumbosacral region: Secondary | ICD-10-CM | POA: Diagnosis not present

## 2017-03-22 DIAGNOSIS — E782 Mixed hyperlipidemia: Secondary | ICD-10-CM | POA: Diagnosis not present

## 2017-03-22 DIAGNOSIS — M5136 Other intervertebral disc degeneration, lumbar region: Secondary | ICD-10-CM | POA: Diagnosis not present

## 2017-03-22 DIAGNOSIS — Z8739 Personal history of other diseases of the musculoskeletal system and connective tissue: Secondary | ICD-10-CM | POA: Insufficient documentation

## 2017-03-22 DIAGNOSIS — M25551 Pain in right hip: Secondary | ICD-10-CM | POA: Diagnosis present

## 2017-03-22 DIAGNOSIS — M79604 Pain in right leg: Secondary | ICD-10-CM | POA: Insufficient documentation

## 2017-03-22 DIAGNOSIS — I10 Essential (primary) hypertension: Secondary | ICD-10-CM

## 2017-03-22 DIAGNOSIS — I700 Atherosclerosis of aorta: Secondary | ICD-10-CM | POA: Insufficient documentation

## 2017-03-22 DIAGNOSIS — M1611 Unilateral primary osteoarthritis, right hip: Secondary | ICD-10-CM | POA: Insufficient documentation

## 2017-03-22 MED ORDER — PREDNISONE 10 MG (21) PO TBPK: ORAL_TABLET | ORAL | 0 refills | Status: DC

## 2017-03-22 NOTE — Telephone Encounter (Signed)
-----   Message from Margo Common, [place] sent at 03/22/2017  1:50 PM EST ----- Arthritis in the right hip is no worse than last check a year ago. Lumbar spine shows advanced facet degeneration along with chronic areas. Suspect leg discomfort probably from all this arthritic degeneration. Suggest Prednisone 10 mg 6 Day taper (Sterapred Unipak #21 tablets) to be taken as directed on the package and recheck if no better in 7-10 days. Apply moist heat to the back as needed.

## 2017-03-22 NOTE — Telephone Encounter (Signed)
Patient advised. RX sent to [facility].

## 2017-03-22 NOTE — Progress Notes (Signed)
Patient: Luke Rojas Male    DOB: 04-12-1945   72 y.o.   MRN: [id number] Visit Date: 03/22/2017  Today's Provider: Vernie Murders, PA   Chief Complaint  Patient presents with  . Hip Pain   Subjective:    Hip Pain   Incident onset: approximately 1 month ago. There was no injury mechanism. The pain is present in the right hip and right leg. The quality of the pain is described as aching and shooting. The pain has been worsening since onset. Exacerbated by: unknown. He has tried nothing for the symptoms.   Family History  Problem Relation Age of Onset  . Pulmonary embolism Mother   . Transient ischemic attack Mother   . Pancreatic cancer Father   . Hypertension Father   . Cirrhosis Brother    Past Medical History:  Diagnosis Date  . Chronic kidney disease    kidney stones  . Coronary artery disease    2 stents   . GERD (gastroesophageal reflux disease)   . Hypercholesteremia   . Hypertension   . Myocardial infarction [facility]) 2013   Past Surgical History:  Procedure Laterality Date  . CARDIAC CATHETERIZATION  2013   2 stents  . COLONOSCOPY  04/27/05  . COLONOSCOPY WITH PROPOFOL N/A 06/08/2015   Procedure: COLONOSCOPY WITH PROPOFOL;  Surgeon: Robert Bellow, MD;  Location: [facility] ENDOSCOPY;  Service: Endoscopy;  Laterality: N/A;  . CYSTOSCOPY WITH STENT PLACEMENT Right 09/16/2016   Procedure: CYSTOSCOPY WITH STENT PLACEMENT;  Surgeon: Royston Cowper, MD;  Location: [facility] ORS;  Service: Urology;  Laterality: Right;  . EXTRACORPOREAL SHOCK WAVE LITHOTRIPSY Left 11/04/2014   has had 2 previous lithotripsies  . EXTRACORPOREAL SHOCK WAVE LITHOTRIPSY Left 03/24/2015   Procedure: EXTRACORPOREAL SHOCK WAVE LITHOTRIPSY (ESWL);  Surgeon: Royston Cowper, MD;  Location: [facility] ORS;  Service: Urology;  Laterality: Left;  . EXTRACORPOREAL SHOCK WAVE LITHOTRIPSY Right 10/04/2016   Procedure: EXTRACORPOREAL SHOCK WAVE LITHOTRIPSY (ESWL);  Surgeon: Royston Cowper, MD;  Location:  [facility] ORS;  Service: Urology;  Laterality: Right;   Allergies  Allergen Reactions  . Codeine Nausea Only    Current Outpatient Medications:  .  atorvastatin (LIPITOR) 40 MG tablet, Take 1 tablet by mouth daily., Disp: , Rfl:  .  cyclobenzaprine (FLEXERIL) 5 MG tablet, Take 1 tablet (5 mg total) by mouth 3 (three) times daily as needed for muscle spasms., Disp: 30 tablet, Rfl: 1 .  docusate sodium (COLACE) 100 MG capsule, Take 2 capsules (200 mg total) by mouth 2 (two) times daily., Disp: 120 capsule, Rfl: 3 .  finasteride (PROSCAR) 5 MG tablet, Take 5 mg by mouth every evening., Disp: , Rfl:  .  HYDROcodone-acetaminophen (NORCO) 7.5-325 MG tablet, Take 1-2 tablets by mouth every 4 (four) hours as needed for moderate pain. Maximum dose per 24 hours - 8 pills, Disp: 20 tablet, Rfl: 0 .  meloxicam (MOBIC) 15 MG tablet, TAKE 1 TABLET BY MOUTH ONCE A DAY, Disp: 30 tablet, Rfl: 3 .  metoprolol (LOPRESSOR) 50 MG tablet, Take 50 mg by mouth 2 (two) times daily. Morning and night, Disp: , Rfl:  .  MULTIPLE VITAMIN PO, Take 1 tablet by mouth daily., Disp: , Rfl:  .  nitroGLYCERIN (NITROSTAT) 0.4 MG SL tablet, Place 0.4 mg under the tongue every 5 (five) minutes as needed. Reported on 03/24/2015, Disp: , Rfl:  .  NUCYNTA 50 MG tablet, Take 1 tablet (50 mg total) by mouth every 6 (six)  hours as needed for moderate pain. 1 TO 2 TABS Q 6 HOURS PRN PAIN, Disp: 20 tablet, Rfl: 0 .  Omega-3 Fatty Acids (FISH OIL) 1200 MG CAPS, Take 1 capsule by mouth 2 (two) times daily., Disp: , Rfl:  .  omeprazole (PRILOSEC) 20 MG capsule, Take 20 mg by mouth daily. , Disp: , Rfl:  .  ondansetron (ZOFRAN ODT) 8 MG disintegrating tablet, Take 0.5 tablets (4 mg total) by mouth every 6 (six) hours as needed for nausea or vomiting., Disp: 4 tablet, Rfl: 3 .  ondansetron (ZOFRAN) 4 MG tablet, Take 1 tablet (4 mg total) by mouth daily as needed for nausea or vomiting., Disp: 30 tablet, Rfl: 1 .  phenazopyridine (PYRIDIUM) 200 MG  tablet, Take 1 tablet (200 mg total) by mouth 3 (three) times daily as needed for pain., Disp: 12 tablet, Rfl: 0 .  ramipril (ALTACE) 10 MG capsule, Take 1 capsule by mouth daily., Disp: , Rfl:  .  tamsulosin (FLOMAX) 0.4 MG CAPS capsule, Take 1 capsule (0.4 mg total) by mouth daily after breakfast., Disp: 14 capsule, Rfl: 0  Review of Systems  Constitutional: Negative.   Respiratory: Negative.   Cardiovascular: Negative.   Musculoskeletal:       Hip pain     Social History   Tobacco Use  . Smoking status: Former Smoker    Packs/day: 1.00    Years: 30.00    Pack years: 30.00    Types: Cigarettes    Last attempt to quit: 03/05/1984    Years since quitting: 33.0  . Smokeless tobacco: Current User    Types: Chew  Substance Use Topics  . Alcohol use: No    Alcohol/week: 0.0 oz   Objective:   BP 136/62 (BP Location: Right Arm, Patient Position: Sitting, Cuff Size: Normal)   Pulse (!) 57   Temp 98.2 F (36.8 C) (Oral)   Wt 194 lb 3.2 oz (88.1 kg)   SpO2 99%   BMI 29.53 kg/m   Physical Exam  Constitutional: He is oriented to person, place, and time. He appears well-developed and well-nourished. No distress.  HENT:  Head: Normocephalic and atraumatic.  Right Ear: Hearing normal.  Left Ear: Hearing normal.  Nose: Nose normal.  Eyes: Conjunctivae and lids are normal. Right eye exhibits no discharge. Left eye exhibits no discharge. No scleral icterus.  Neck: Neck supple.  Cardiovascular: Normal rate.  Pulmonary/Chest: Effort normal. No respiratory distress.  Musculoskeletal: Normal range of motion.  Ache in right hip with radiation into the right lateral calf. Good pulses and ROM.  Neurological: He is alert and oriented to person, place, and time.  Skin: Skin is intact. No lesion and no rash noted.  Psychiatric: He has a normal mood and affect. His speech is normal and behavior is normal. Thought content normal.      Assessment & Plan:        1. Right leg pain Over the  past month having aches and pain in the lateral right thigh and lateral calf. Fair ROM of knees and hip. Pain in hip and leg when lying in bed at night. Will get CBC and sed rate with x-ray evaluation of past arthritis. Continue Meloxicam and Tylenol. May add moist heat applications and may need orthopedic evaluation for cortisone injections. - DG Lumbar Spine Complete - DG HIP UNILAT WITH PELVIS 1V RIGHT - CBC with Differential/Platelet - Sedimentation rate  2. Hx of osteoarthritis X-rays January 2018 showed some mild arthritis in the  right hip and DDD L3-4 & L2-3 with arthritic spurring. Still taking the Meloxicam and occasional Tylenol. Will recheck labs and x-ray for signs of progression. - DG Lumbar Spine Complete - DG HIP UNILAT WITH PELVIS 1V RIGHT - CBC with Differential/Platelet - Sedimentation rate  3. Essential (primary) hypertension Well controlled with the Metoprolol 50 mg BID and Ramipril 10 mg qd. Check follow up labs and follow up pending reports. - CBC with Differential/Platelet - Comprehensive metabolic panel  4. Mixed hyperlipidemia Tolerating Atorvastatin 40 mg qd with low fat diet and no significant side effects recognized. Recheck labs and follow up pending reports. - Comprehensive metabolic panel - Lipid panel    Vernie Murders, PA  [facility]

## 2017-03-23 LAB — COMPREHENSIVE METABOLIC PANEL
A/G RATIO: 1.5 (ref 1.2–2.2)
ALK PHOS: 79 IU/L (ref 39–117)
ALT: 28 IU/L (ref 0–44)
AST: 26 IU/L (ref 0–40)
Albumin: 4.3 g/dL (ref 3.5–4.8)
BILIRUBIN TOTAL: 0.6 mg/dL (ref 0.0–1.2)
BUN/Creatinine Ratio: 15 (ref 10–24)
BUN: 18 mg/dL (ref 8–27)
CHLORIDE: 103 mmol/L (ref 96–106)
CO2: 20 mmol/L (ref 20–29)
Calcium: 9.3 mg/dL (ref 8.6–10.2)
Creatinine, Ser: 1.24 mg/dL (ref 0.76–1.27)
GFR calc Af Amer: 67 mL/min/{1.73_m2} (ref >59)
GFR, EST NON AFRICAN AMERICAN: 58 mL/min/{1.73_m2} — AB (ref >59)
GLOBULIN, TOTAL: 2.8 g/dL (ref 1.5–4.5)
Glucose: 120 mg/dL — ABNORMAL HIGH (ref 65–99)
POTASSIUM: 4.3 mmol/L (ref 3.5–5.2)
SODIUM: 141 mmol/L (ref 134–144)
Total Protein: 7.1 g/dL (ref 6.0–8.5)

## 2017-03-23 LAB — CBC WITH DIFFERENTIAL/PLATELET
BASOS: 0 %
Basophils Absolute: 0 10*3/uL (ref 0.0–0.2)
EOS (ABSOLUTE): 0.3 10*3/uL (ref 0.0–0.4)
EOS: 5 %
HEMATOCRIT: 45.4 % (ref 37.5–51.0)
Hemoglobin: 15.4 g/dL (ref 13.0–17.7)
IMMATURE GRANS (ABS): 0 10*3/uL (ref 0.0–0.1)
IMMATURE GRANULOCYTES: 0 %
LYMPHS: 27 %
Lymphocytes Absolute: 1.9 10*3/uL (ref 0.7–3.1)
MCH: 29.7 pg (ref 26.6–33.0)
MCHC: 33.9 g/dL (ref 31.5–35.7)
MCV: 88 fL (ref 79–97)
MONOCYTES: 10 %
MONOS ABS: 0.7 10*3/uL (ref 0.1–0.9)
NEUTROS PCT: 58 %
Neutrophils Absolute: 4 10*3/uL (ref 1.4–7.0)
Platelets: 166 10*3/uL (ref 150–379)
RBC: 5.18 x10E6/uL (ref 4.14–5.80)
RDW: 15.2 % (ref 12.3–15.4)
WBC: 6.9 10*3/uL (ref 3.4–10.8)

## 2017-03-23 LAB — LIPID PANEL
CHOL/HDL RATIO: 3.7 ratio (ref 0.0–5.0)
Cholesterol, Total: 99 mg/dL — ABNORMAL LOW (ref 100–199)
HDL: 27 mg/dL — ABNORMAL LOW (ref >39)
LDL Calculated: 47 mg/dL (ref 0–99)
TRIGLYCERIDES: 127 mg/dL (ref 0–149)
VLDL Cholesterol Cal: 25 mg/dL (ref 5–40)

## 2017-03-23 LAB — SEDIMENTATION RATE: Sed Rate: 33 mm/hr — ABNORMAL HIGH (ref 0–30)

## 2017-03-25 ENCOUNTER — Telehealth: Payer: Self-pay

## 2017-03-25 NOTE — Telephone Encounter (Signed)
Patient advised. 3 month follow up scheduled. He states he takes Omega-3 Fish Oil 1200 mg BID and Atorvastatin 40 mg QD now. Any other recommendations?

## 2017-03-25 NOTE — Telephone Encounter (Signed)
Very good. Usually this regimen will help get things back in line if you can exercise some. With the recent flare of the arthritis, I know the exercise can be difficult. Keep taking the Atorvastatin and Omega-3 Fish Oil as you have been and recheck lipid levels in 3 months.

## 2017-03-25 NOTE — Telephone Encounter (Signed)
-----   Message from [place], [place] sent at 03/25/2017  9:22 AM EST ----- Blood tests show slight increase in inflammation test (Sed Rate) and should improve with use of the Prednisone Taper. If no better in 7-10 days, will need recheck. HDL cholesterol ("good" cholesterol) is lower. Need Omega-3 Fish Oil or Krill Oil 1000 mg qd and recheck labs in 3 months.

## 2017-04-01 ENCOUNTER — Ambulatory Visit (INDEPENDENT_AMBULATORY_CARE_PROVIDER_SITE_OTHER): Payer: Medicare Other | Admitting: Physician Assistant

## 2017-04-01 ENCOUNTER — Encounter: Payer: Self-pay | Admitting: Physician Assistant

## 2017-04-01 VITALS — BP 132/76 | HR 58 | Temp 97.9°F | Resp 16 | Wt 194.0 lb

## 2017-04-01 DIAGNOSIS — J069 Acute upper respiratory infection, unspecified: Secondary | ICD-10-CM | POA: Diagnosis not present

## 2017-04-01 NOTE — Patient Instructions (Signed)
Mucinex for congestion  Upper Respiratory Infection, Adult Most upper respiratory infections (URIs) are caused by a virus. A URI affects the nose, throat, and upper air passages. The most common type of URI is often called "the common cold." Follow these instructions at home:  Take medicines only as told by your doctor.  Gargle warm saltwater or take cough drops to comfort your throat as told by your doctor.  Use a warm mist humidifier or inhale steam from a shower to increase air moisture. This may make it easier to breathe.  Drink enough fluid to keep your pee (urine) clear or pale yellow.  Eat soups and other clear broths.  Have a healthy diet.  Rest as needed.  Go back to work when your fever is gone or your doctor says it is okay. ? You may need to stay home longer to avoid giving your URI to others. ? You can also wear a face mask and wash your hands often to prevent spread of the virus.  Use your inhaler more if you have asthma.  Do not use any tobacco products, including cigarettes, chewing tobacco, or electronic cigarettes. If you need help quitting, ask your doctor. Contact a doctor if:  You are getting worse, not better.  Your symptoms are not helped by medicine.  You have chills.  You are getting more short of breath.  You have brown or red mucus.  You have yellow or brown discharge from your nose.  You have pain in your face, especially when you bend forward.  You have a fever.  You have puffy (swollen) neck glands.  You have pain while swallowing.  You have white areas in the back of your throat. Get help right away if:  You have very bad or constant: ? Headache. ? Ear pain. ? Pain in your forehead, behind your eyes, and over your cheekbones (sinus pain). ? Chest pain.  You have long-lasting (chronic) lung disease and any of the following: ? Wheezing. ? Long-lasting cough. ? Coughing up blood. ? A change in your usual mucus.  You have a  stiff neck.  You have changes in your: ? Vision. ? Hearing. ? Thinking. ? Mood. This information is not intended to replace advice given to you by your health care provider. Make sure you discuss any questions you have with your health care provider. Document Released: 08/08/2007 Document Revised: 10/23/2015 Document Reviewed: 05/27/2013 Elsevier Interactive Patient Education  2018 [employer].

## 2017-04-01 NOTE — Addendum Note (Signed)
Addended by: Mar Daring on: 04/01/2017 12:25 PM   Modules accepted: Level of Service

## 2017-04-01 NOTE — Progress Notes (Signed)
Patient: Luke Rojas Male    DOB: 1945-03-21   72 y.o.   MRN: [id number] Visit Date: 04/01/2017  Today's Provider: Mar Daring, PA-C   Chief Complaint  Patient presents with  . URI   Subjective:    URI   This is a new problem. The current episode started in the past 7 days (about 3 days). The problem has been gradually worsening. There has been no fever. Associated symptoms include congestion, coughing, headaches, rhinorrhea and a sore throat. Pertinent negatives include no abdominal pain, chest pain, ear pain, nausea, sinus pain, sneezing or wheezing. He has tried nothing for the symptoms.      Allergies  Allergen Reactions  . Codeine Nausea Only     Current Outpatient Medications:  .  atorvastatin (LIPITOR) 40 MG tablet, Take 1 tablet by mouth daily., Disp: , Rfl:  .  cyclobenzaprine (FLEXERIL) 5 MG tablet, Take 1 tablet (5 mg total) by mouth 3 (three) times daily as needed for muscle spasms., Disp: 30 tablet, Rfl: 1 .  docusate sodium (COLACE) 100 MG capsule, Take 2 capsules (200 mg total) by mouth 2 (two) times daily., Disp: 120 capsule, Rfl: 3 .  finasteride (PROSCAR) 5 MG tablet, Take 5 mg by mouth every evening., Disp: , Rfl:  .  HYDROcodone-acetaminophen (NORCO) 7.5-325 MG tablet, Take 1-2 tablets by mouth every 4 (four) hours as needed for moderate pain. Maximum dose per 24 hours - 8 pills, Disp: 20 tablet, Rfl: 0 .  meloxicam (MOBIC) 15 MG tablet, TAKE 1 TABLET BY MOUTH ONCE A DAY, Disp: 30 tablet, Rfl: 3 .  metoprolol (LOPRESSOR) 50 MG tablet, Take 50 mg by mouth 2 (two) times daily. Morning and night, Disp: , Rfl:  .  MULTIPLE VITAMIN PO, Take 1 tablet by mouth daily., Disp: , Rfl:  .  nitroGLYCERIN (NITROSTAT) 0.4 MG SL tablet, Place 0.4 mg under the tongue every 5 (five) minutes as needed. Reported on 03/24/2015, Disp: , Rfl:  .  NUCYNTA 50 MG tablet, Take 1 tablet (50 mg total) by mouth every 6 (six) hours as needed for moderate pain. 1 TO 2 TABS  Q 6 HOURS PRN PAIN, Disp: 20 tablet, Rfl: 0 .  Omega-3 Fatty Acids (FISH OIL) 1200 MG CAPS, Take 1 capsule by mouth 2 (two) times daily., Disp: , Rfl:  .  omeprazole (PRILOSEC) 20 MG capsule, Take 20 mg by mouth daily. , Disp: , Rfl:  .  ondansetron (ZOFRAN ODT) 8 MG disintegrating tablet, Take 0.5 tablets (4 mg total) by mouth every 6 (six) hours as needed for nausea or vomiting., Disp: 4 tablet, Rfl: 3 .  phenazopyridine (PYRIDIUM) 200 MG tablet, Take 1 tablet (200 mg total) by mouth 3 (three) times daily as needed for pain., Disp: 12 tablet, Rfl: 0 .  ramipril (ALTACE) 10 MG capsule, Take 1 capsule by mouth daily., Disp: , Rfl:  .  tamsulosin (FLOMAX) 0.4 MG CAPS capsule, Take 1 capsule (0.4 mg total) by mouth daily after breakfast., Disp: 14 capsule, Rfl: 0 .  ondansetron (ZOFRAN) 4 MG tablet, Take 1 tablet (4 mg total) by mouth daily as needed for nausea or vomiting., Disp: 30 tablet, Rfl: 1 .  predniSONE (STERAPRED UNI-PAK 21 TAB) 10 MG (21) TBPK tablet, 6 day taper dose. Take as directed on package. (Patient not taking: Reported on 04/01/2017), Disp: 21 tablet, Rfl: 0  Review of Systems  Constitutional: Positive for activity change and fatigue.  HENT: Positive for  congestion, postnasal drip, rhinorrhea, sinus pressure and sore throat. Negative for ear pain, sinus pain, sneezing and voice change.   Respiratory: Positive for cough. Negative for chest tightness, shortness of breath and wheezing.   Cardiovascular: Negative for chest pain and leg swelling.  Gastrointestinal: Negative for abdominal pain and nausea.  Neurological: Positive for headaches. Negative for dizziness.    Social History   Tobacco Use  . Smoking status: Former Smoker    Packs/day: 1.00    Years: 30.00    Pack years: 30.00    Types: Cigarettes    Last attempt to quit: 03/05/1984    Years since quitting: 33.0  . Smokeless tobacco: Current User    Types: Chew  Substance Use Topics  . Alcohol use: No     Alcohol/week: 0.0 oz   Objective:   BP 132/76 (BP Location: Left Arm, Patient Position: Sitting, Cuff Size: Normal)   Pulse (!) 58   Temp 97.9 F (36.6 C)   Resp 16   Wt 194 lb (88 kg)   SpO2 99%   BMI 29.50 kg/m  Vitals:   04/01/17 1134  BP: 132/76  Pulse: (!) 58  Resp: 16  Temp: 97.9 F (36.6 C)  SpO2: 99%  Weight: 194 lb (88 kg)     Physical Exam  Constitutional: He appears well-developed and well-nourished. No distress.  HENT:  Head: Normocephalic and atraumatic.  Right Ear: Hearing, external ear and ear canal normal. Tympanic membrane is not erythematous and not bulging. No middle ear effusion.  Left Ear: Hearing, external ear and ear canal normal. Tympanic membrane is not erythematous and not bulging.  No middle ear effusion.  Nose: Mucosal edema present. No rhinorrhea. Right sinus exhibits no maxillary sinus tenderness and no frontal sinus tenderness. Left sinus exhibits no maxillary sinus tenderness and no frontal sinus tenderness.  Mouth/Throat: Uvula is midline and mucous membranes are normal. Posterior oropharyngeal erythema present. No oropharyngeal exudate or posterior oropharyngeal edema.  Cerumen in each ear blocking visualization of the TM bilaterally  Eyes: Conjunctivae and EOM are normal. Pupils are equal, round, and reactive to light. Right eye exhibits no discharge. Left eye exhibits no discharge.  Neck: Normal range of motion. Neck supple. No tracheal deviation present. No Brudzinski's sign and no Kernig's sign noted. No thyromegaly present.  Cardiovascular: Normal rate, regular rhythm and normal heart sounds. Exam reveals no gallop and no friction rub.  No murmur heard. Pulmonary/Chest: Effort normal and breath sounds normal. No stridor. No respiratory distress. He has no wheezes. He has no rales.  Lymphadenopathy:    He has no cervical adenopathy.  Skin: Skin is warm and dry. He is not diaphoretic.  Vitals reviewed.       Assessment & Plan:       1. Viral URI Conservative management for viral URI. Not using any OTC except chloraseptic spray at this time. Advised of second sickening warning signs and return precautions. Mucinex may be used for congestion. Salt water gargles for sore throat. Call if symptoms fail to improve or worsen.        Mar Daring, PA-C  [facility]

## 2017-04-04 ENCOUNTER — Ambulatory Visit (INDEPENDENT_AMBULATORY_CARE_PROVIDER_SITE_OTHER): Payer: Medicare Other | Admitting: Family Medicine

## 2017-04-04 ENCOUNTER — Encounter: Payer: Self-pay | Admitting: Family Medicine

## 2017-04-04 VITALS — BP 110/62 | HR 58 | Temp 98.0°F | Wt 201.0 lb

## 2017-04-04 DIAGNOSIS — R5381 Other malaise: Secondary | ICD-10-CM

## 2017-04-04 DIAGNOSIS — R05 Cough: Secondary | ICD-10-CM | POA: Diagnosis not present

## 2017-04-04 DIAGNOSIS — R059 Cough, unspecified: Secondary | ICD-10-CM

## 2017-04-04 LAB — POCT INFLUENZA A/B
Influenza A, POC: POSITIVE — AB
Influenza B, POC: NEGATIVE

## 2017-04-04 MED ORDER — OSELTAMIVIR PHOSPHATE 75 MG PO CAPS: 75.0000 mg | ORAL_CAPSULE | Freq: Two times a day (BID) | ORAL | 0 refills | Status: DC

## 2017-04-04 MED ORDER — HYDROCODONE-HOMATROPINE 5-1.5 MG/5ML PO SYRP: 5.0000 mL | ORAL_SOLUTION | Freq: Three times a day (TID) | ORAL | 0 refills | Status: DC | PRN

## 2017-04-04 NOTE — Patient Instructions (Signed)

## 2017-04-04 NOTE — Progress Notes (Signed)
Patient: Luke Rojas Male    DOB: 08-05-1945   72 y.o.   MRN: [id number] Visit Date: 04/04/2017  Today's Provider: Vernie Murders, PA   Chief Complaint  Patient presents with  . URI   Subjective:    HPI Viral URI:  Patient presents today for a follow up. Last OV was on 04/01/17. He was advised to use Mucinex for congestion, and salt water gargles for sore throat. Patient states symptoms have worsen since OV. He reports symptoms started 1 week ago.     Past Medical History:  Diagnosis Date  . Chronic kidney disease    kidney stones  . Coronary artery disease    2 stents   . GERD (gastroesophageal reflux disease)   . Hypercholesteremia   . Hypertension   . Myocardial infarction [facility]) 2013   Past Surgical History:  Procedure Laterality Date  . CARDIAC CATHETERIZATION  2013   2 stents  . COLONOSCOPY  04/27/05  . COLONOSCOPY WITH PROPOFOL N/A 06/08/2015   Procedure: COLONOSCOPY WITH PROPOFOL;  Surgeon: Robert Bellow, MD;  Location: [facility] ENDOSCOPY;  Service: Endoscopy;  Laterality: N/A;  . CYSTOSCOPY WITH STENT PLACEMENT Right 09/16/2016   Procedure: CYSTOSCOPY WITH STENT PLACEMENT;  Surgeon: Royston Cowper, MD;  Location: [facility] ORS;  Service: Urology;  Laterality: Right;  . EXTRACORPOREAL SHOCK WAVE LITHOTRIPSY Left 11/04/2014   has had 2 previous lithotripsies  . EXTRACORPOREAL SHOCK WAVE LITHOTRIPSY Left 03/24/2015   Procedure: EXTRACORPOREAL SHOCK WAVE LITHOTRIPSY (ESWL);  Surgeon: Royston Cowper, MD;  Location: [facility] ORS;  Service: Urology;  Laterality: Left;  . EXTRACORPOREAL SHOCK WAVE LITHOTRIPSY Right 10/04/2016   Procedure: EXTRACORPOREAL SHOCK WAVE LITHOTRIPSY (ESWL);  Surgeon: Royston Cowper, MD;  Location: [facility] ORS;  Service: Urology;  Laterality: Right;   Family History  Problem Relation Age of Onset  . Pulmonary embolism Mother   . Transient ischemic attack Mother   . Pancreatic cancer Father   . Hypertension Father   . Cirrhosis Brother     Allergies  Allergen Reactions  . Codeine Nausea Only    Current Outpatient Medications:  .  atorvastatin (LIPITOR) 40 MG tablet, Take 1 tablet by mouth daily., Disp: , Rfl:  .  cyclobenzaprine (FLEXERIL) 5 MG tablet, Take 1 tablet (5 mg total) by mouth 3 (three) times daily as needed for muscle spasms., Disp: 30 tablet, Rfl: 1 .  docusate sodium (COLACE) 100 MG capsule, Take 2 capsules (200 mg total) by mouth 2 (two) times daily., Disp: 120 capsule, Rfl: 3 .  finasteride (PROSCAR) 5 MG tablet, Take 5 mg by mouth every evening., Disp: , Rfl:  .  HYDROcodone-acetaminophen (NORCO) 7.5-325 MG tablet, Take 1-2 tablets by mouth every 4 (four) hours as needed for moderate pain. Maximum dose per 24 hours - 8 pills, Disp: 20 tablet, Rfl: 0 .  meloxicam (MOBIC) 15 MG tablet, TAKE 1 TABLET BY MOUTH ONCE A DAY, Disp: 30 tablet, Rfl: 3 .  metoprolol (LOPRESSOR) 50 MG tablet, Take 50 mg by mouth 2 (two) times daily. Morning and night, Disp: , Rfl:  .  MULTIPLE VITAMIN PO, Take 1 tablet by mouth daily., Disp: , Rfl:  .  nitroGLYCERIN (NITROSTAT) 0.4 MG SL tablet, Place 0.4 mg under the tongue every 5 (five) minutes as needed. Reported on 03/24/2015, Disp: , Rfl:  .  NUCYNTA 50 MG tablet, Take 1 tablet (50 mg total) by mouth every 6 (six) hours as needed for moderate pain. 1 TO  2 TABS Q 6 HOURS PRN PAIN, Disp: 20 tablet, Rfl: 0 .  Omega-3 Fatty Acids (FISH OIL) 1200 MG CAPS, Take 1 capsule by mouth 2 (two) times daily., Disp: , Rfl:  .  omeprazole (PRILOSEC) 20 MG capsule, Take 20 mg by mouth daily. , Disp: , Rfl:  .  ondansetron (ZOFRAN ODT) 8 MG disintegrating tablet, Take 0.5 tablets (4 mg total) by mouth every 6 (six) hours as needed for nausea or vomiting., Disp: 4 tablet, Rfl: 3 .  ondansetron (ZOFRAN) 4 MG tablet, Take 1 tablet (4 mg total) by mouth daily as needed for nausea or vomiting., Disp: 30 tablet, Rfl: 1 .  phenazopyridine (PYRIDIUM) 200 MG tablet, Take 1 tablet (200 mg total) by mouth 3  (three) times daily as needed for pain., Disp: 12 tablet, Rfl: 0 .  ramipril (ALTACE) 10 MG capsule, Take 1 capsule by mouth daily., Disp: , Rfl:  .  tamsulosin (FLOMAX) 0.4 MG CAPS capsule, Take 1 capsule (0.4 mg total) by mouth daily after breakfast., Disp: 14 capsule, Rfl: 0  Review of Systems  Constitutional: Positive for appetite change and fatigue.  HENT: Positive for congestion, postnasal drip, sinus pressure and sore throat.   Respiratory: Positive for cough.   Cardiovascular: Negative.   Neurological: Positive for headaches.    Social History   Tobacco Use  . Smoking status: Former Smoker    Packs/day: 1.00    Years: 30.00    Pack years: 30.00    Types: Cigarettes    Last attempt to quit: 03/05/1984    Years since quitting: 33.1  . Smokeless tobacco: Current User    Types: Chew  Substance Use Topics  . Alcohol use: No    Alcohol/week: 0.0 oz   Objective:   BP 110/62 (BP Location: Right Arm, Patient Position: Sitting, Cuff Size: Normal)   Pulse (!) 58   Temp 98 F (36.7 C) (Oral)   Wt 201 lb (91.2 kg)   SpO2 97%   BMI 30.56 kg/m    Physical Exam  Constitutional: He is oriented to person, place, and time. He appears well-developed and well-nourished. No distress.  HENT:  Head: Normocephalic and atraumatic.  Right Ear: Hearing and external ear normal.  Left Ear: Hearing and external ear normal.  Nose: Nose normal.  Slight erythema to posterior pharynx and tonsillar pillars. No exudates, ulcerations or tonsillar enlargement.  Eyes: Conjunctivae and lids are normal. Right eye exhibits no discharge. Left eye exhibits no discharge. No scleral icterus.  Neck: Neck supple.  Cardiovascular: Normal rate and regular rhythm.  Pulmonary/Chest: Effort normal. No respiratory distress. He has no wheezes. He has no rales. He exhibits tenderness.  Abdominal: Soft. Bowel sounds are normal.  Musculoskeletal: Normal range of motion.  Lymphadenopathy:    He has no cervical  adenopathy.  Neurological: He is alert and oriented to person, place, and time.  Skin: Skin is intact. No lesion and no rash noted.  Psychiatric: He has a normal mood and affect. His speech is normal and behavior is normal. Thought content normal.      Assessment & Plan:     1. Cough Aching, burning sensation in chest without sputum production when coughing the past 3-4 days. Seems to be worsening the past 2 days. Given cough syrup and increase fluid intake. - oseltamivir (TAMIFLU) 75 MG capsule; Take 1 capsule (75 mg total) by mouth 2 (two) times daily.  Dispense: 10 capsule; Refill: 0 - HYDROcodone-homatropine (HYCODAN) 5-1.5 MG/5ML syrup; Take 5  mLs by mouth every 8 (eight) hours as needed for cough.  Dispense: 120 mL; Refill: 0  2. Malaise Onset over the past 3-4 days and worsening. Flu test questionably positive. Will treat with Tamiflu and given Hycodan for cough. Use Tylenol prn aches and pains. Increase fluid intake and recheck if worsening or purulent sputum production in the next 5-6 days. Home to rest. - oseltamivir (TAMIFLU) 75 MG capsule; Take 1 capsule (75 mg total) by mouth 2 (two) times daily.  Dispense: 10 capsule; Refill: [place], PA  [facility]

## 2017-04-18 ENCOUNTER — Encounter: Payer: Self-pay | Admitting: Family Medicine

## 2017-04-18 ENCOUNTER — Ambulatory Visit (INDEPENDENT_AMBULATORY_CARE_PROVIDER_SITE_OTHER): Payer: Medicare Other | Admitting: Family Medicine

## 2017-04-18 VITALS — BP 110/62 | HR 56 | Temp 98.1°F | Wt 197.6 lb

## 2017-04-18 DIAGNOSIS — Z8739 Personal history of other diseases of the musculoskeletal system and connective tissue: Secondary | ICD-10-CM | POA: Diagnosis not present

## 2017-04-18 DIAGNOSIS — M5441 Lumbago with sciatica, right side: Secondary | ICD-10-CM | POA: Diagnosis not present

## 2017-04-18 MED ORDER — PREDNISONE 10 MG PO TABS: 10.0000 mg | ORAL_TABLET | Freq: Every day | ORAL | 0 refills | Status: DC

## 2017-04-18 NOTE — Progress Notes (Signed)
Patient: Luke Rojas Male    DOB: 09/12/1945   72 y.o.   MRN: [id number] Visit Date: 04/18/2017  Today's Provider: Vernie Murders, PA   Chief Complaint  Patient presents with  . Back Pain   Subjective:    Back Pain  This is a new problem. The current episode started more than 1 month ago. The problem occurs intermittently. The problem has been gradually worsening since onset. The pain is present in the lumbar spine. The quality of the pain is described as shooting. The pain radiates to the right thigh, right knee and right foot. Risk factors: arthritis  He has tried heat for the symptoms. The treatment provided mild relief.   Past Medical History:  Diagnosis Date  . Chronic kidney disease    kidney stones  . Coronary artery disease    2 stents   . GERD (gastroesophageal reflux disease)   . Hypercholesteremia   . Hypertension   . Myocardial infarction [facility]) 2013   Past Surgical History:  Procedure Laterality Date  . CARDIAC CATHETERIZATION  2013   2 stents  . COLONOSCOPY  04/27/05  . COLONOSCOPY WITH PROPOFOL N/A 06/08/2015   Procedure: COLONOSCOPY WITH PROPOFOL;  Surgeon: Robert Bellow, MD;  Location: [facility] ENDOSCOPY;  Service: Endoscopy;  Laterality: N/A;  . CYSTOSCOPY WITH STENT PLACEMENT Right 09/16/2016   Procedure: CYSTOSCOPY WITH STENT PLACEMENT;  Surgeon: Royston Cowper, MD;  Location: [facility] ORS;  Service: Urology;  Laterality: Right;  . EXTRACORPOREAL SHOCK WAVE LITHOTRIPSY Left 11/04/2014   has had 2 previous lithotripsies  . EXTRACORPOREAL SHOCK WAVE LITHOTRIPSY Left 03/24/2015   Procedure: EXTRACORPOREAL SHOCK WAVE LITHOTRIPSY (ESWL);  Surgeon: Royston Cowper, MD;  Location: [facility] ORS;  Service: Urology;  Laterality: Left;  . EXTRACORPOREAL SHOCK WAVE LITHOTRIPSY Right 10/04/2016   Procedure: EXTRACORPOREAL SHOCK WAVE LITHOTRIPSY (ESWL);  Surgeon: Royston Cowper, MD;  Location: [facility] ORS;  Service: Urology;  Laterality: Right;   Family History    Problem Relation Age of Onset  . Pulmonary embolism Mother   . Transient ischemic attack Mother   . Pancreatic cancer Father   . Hypertension Father   . Cirrhosis Brother    Allergies  Allergen Reactions  . Codeine Nausea Only    Current Outpatient Medications:  .  atorvastatin (LIPITOR) 40 MG tablet, Take 1 tablet by mouth daily., Disp: , Rfl:  .  cyclobenzaprine (FLEXERIL) 5 MG tablet, Take 1 tablet (5 mg total) by mouth 3 (three) times daily as needed for muscle spasms., Disp: 30 tablet, Rfl: 1 .  docusate sodium (COLACE) 100 MG capsule, Take 2 capsules (200 mg total) by mouth 2 (two) times daily., Disp: 120 capsule, Rfl: 3 .  finasteride (PROSCAR) 5 MG tablet, Take 5 mg by mouth every evening., Disp: , Rfl:  .  HYDROcodone-acetaminophen (NORCO) 7.5-325 MG tablet, Take 1-2 tablets by mouth every 4 (four) hours as needed for moderate pain. Maximum dose per 24 hours - 8 pills, Disp: 20 tablet, Rfl: 0 .  HYDROcodone-homatropine (HYCODAN) 5-1.5 MG/5ML syrup, Take 5 mLs by mouth every 8 (eight) hours as needed for cough., Disp: 120 mL, Rfl: 0 .  meloxicam (MOBIC) 15 MG tablet, TAKE 1 TABLET BY MOUTH ONCE A DAY, Disp: 30 tablet, Rfl: 3 .  metoprolol (LOPRESSOR) 50 MG tablet, Take 50 mg by mouth 2 (two) times daily. Morning and night, Disp: , Rfl:  .  MULTIPLE VITAMIN PO, Take 1 tablet by mouth daily., Disp: ,  Rfl:  .  nitroGLYCERIN (NITROSTAT) 0.4 MG SL tablet, Place 0.4 mg under the tongue every 5 (five) minutes as needed. Reported on 03/24/2015, Disp: , Rfl:  .  NUCYNTA 50 MG tablet, Take 1 tablet (50 mg total) by mouth every 6 (six) hours as needed for moderate pain. 1 TO 2 TABS Q 6 HOURS PRN PAIN, Disp: 20 tablet, Rfl: 0 .  Omega-3 Fatty Acids (FISH OIL) 1200 MG CAPS, Take 1 capsule by mouth 2 (two) times daily., Disp: , Rfl:  .  omeprazole (PRILOSEC) 20 MG capsule, Take 20 mg by mouth daily. , Disp: , Rfl:  .  ondansetron (ZOFRAN ODT) 8 MG disintegrating tablet, Take 0.5 tablets (4 mg  total) by mouth every 6 (six) hours as needed for nausea or vomiting., Disp: 4 tablet, Rfl: 3 .  ondansetron (ZOFRAN) 4 MG tablet, Take 1 tablet (4 mg total) by mouth daily as needed for nausea or vomiting., Disp: 30 tablet, Rfl: 1 .  oseltamivir (TAMIFLU) 75 MG capsule, Take 1 capsule (75 mg total) by mouth 2 (two) times daily., Disp: 10 capsule, Rfl: 0 .  phenazopyridine (PYRIDIUM) 200 MG tablet, Take 1 tablet (200 mg total) by mouth 3 (three) times daily as needed for pain., Disp: 12 tablet, Rfl: 0 .  ramipril (ALTACE) 10 MG capsule, Take 1 capsule by mouth daily., Disp: , Rfl:  .  tamsulosin (FLOMAX) 0.4 MG CAPS capsule, Take 1 capsule (0.4 mg total) by mouth daily after breakfast., Disp: 14 capsule, Rfl: 0  Review of Systems  Constitutional: Negative.   Respiratory: Negative.   Cardiovascular: Negative.   Musculoskeletal: Positive for back pain.    Social History   Tobacco Use  . Smoking status: Former Smoker    Packs/day: 1.00    Years: 30.00    Pack years: 30.00    Types: Cigarettes    Last attempt to quit: 03/05/1984    Years since quitting: 33.1  . Smokeless tobacco: Current User    Types: Chew  Substance Use Topics  . Alcohol use: No    Alcohol/week: 0.0 oz   Objective:   BP 110/62 (BP Location: Right Arm, Patient Position: Sitting, Cuff Size: Normal)   Pulse (!) 56   Temp 98.1 F (36.7 C) (Oral)   Wt 197 lb 9.6 oz (89.6 kg)   SpO2 96%   BMI 30.04 kg/m   Physical Exam  Constitutional: He is oriented to person, place, and time. He appears well-developed and well-nourished. No distress.  HENT:  Head: Normocephalic and atraumatic.  Right Ear: Hearing normal.  Left Ear: Hearing normal.  Nose: Nose normal.  Eyes: Conjunctivae and lids are normal. Right eye exhibits no discharge. Left eye exhibits no discharge. No scleral icterus.  Neck: Neck supple.  Pulmonary/Chest: Effort normal. No respiratory distress.  Abdominal: Soft. Bowel sounds are normal.    Musculoskeletal: Normal range of motion.  Neurological: He is alert and oriented to person, place, and time.  Pain in right lower back with SLR's to 90 degrees. Good strength. DTR's equal and active.  Skin: Skin is intact. No lesion and no rash noted.  Psychiatric: He has a normal mood and affect. His speech is normal and behavior is normal. Thought content normal.      Assessment & Plan:     1. Right-sided low back pain with right-sided sciatica, unspecified chronicity Continues to have right low back pain with radiation down the right lateral leg to the ankle. No known injury. X-rays on 03-22-17  confirms degenerative disease. No help with Meloxicam. Will switch to a 12 Day Prednisone 10 mg taper, apply moist heat and use a lumbar supporter. If no better in 10-12 days, will need to consider MRI of lumbar spine and possible specialist referral. - predniSONE (DELTASONE) 10 MG tablet; Take 1 tablet (10 mg total) by mouth daily with breakfast. Taper down by one tablet every 2 days (6,6,5,5,4,4,3,3,2,2,1,1)  Dispense: 42 tablet; Refill: 0  2. Hx of osteoarthritis X-rays on 03-22-17 compared to films of January 2018 shows right hip arthritis and DDD L3-4 & L2-3 with facet hypertrophy worsening. Will treat with Prednisone taper and may use lumbar supporter. May need MRI and referral to spine specialist if no improvement.       Vernie Murders, PA  [facility]

## 2017-05-13 ENCOUNTER — Encounter: Payer: Self-pay | Admitting: Family Medicine

## 2017-05-13 ENCOUNTER — Ambulatory Visit (INDEPENDENT_AMBULATORY_CARE_PROVIDER_SITE_OTHER): Payer: Medicare Other | Admitting: Family Medicine

## 2017-05-13 VITALS — BP 120/62 | HR 72 | Temp 98.1°F | Resp 16

## 2017-05-13 DIAGNOSIS — M5441 Lumbago with sciatica, right side: Secondary | ICD-10-CM | POA: Diagnosis not present

## 2017-05-13 DIAGNOSIS — G8929 Other chronic pain: Secondary | ICD-10-CM

## 2017-05-13 MED ORDER — TRAMADOL HCL 50 MG PO TABS: 50.0000 mg | ORAL_TABLET | Freq: Three times a day (TID) | ORAL | 0 refills | Status: DC | PRN

## 2017-05-13 NOTE — Progress Notes (Signed)
Patient: Luke Rojas Male    DOB: 06-10-1945   72 y.o.   MRN: [id number] Visit Date: 05/13/2017  Today's Provider: Vernie Murders, PA   Chief Complaint  Patient presents with  . Right-sided low back pain with right-sided sciatica    follow up    Subjective:    HPI Right-sided low back pain with right-sided sciatica Patient presents today for a follow up. He was seen in the office on 04/18/17. Patient was started on a 12 day prednisone taper to treat the symptoms. Patient reports that he has completed the medication and he still does not feel much better. He reports that he felt well while on the medication, but soon as he went off, the pain came back.   Patient Active Problem List   Diagnosis Date Noted  . Encounter for screening colonoscopy 05/04/2015  . Arthritis 10/08/2014  . H/O acute myocardial infarction 10/08/2014  . Acid reflux 10/08/2014  . HLD (hyperlipidemia) 10/08/2014  . BP (high blood pressure) 10/08/2014  . 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 Benign neoplasm of skin 07/19/2006  . Essential (primary) hypertension 11/15/2005  . Decreased potassium in the blood 11/15/2005   Past Surgical History:  Procedure Laterality Date  . CARDIAC CATHETERIZATION  2013   2 stents  . COLONOSCOPY  04/27/05  . COLONOSCOPY WITH PROPOFOL N/A 06/08/2015   Procedure: COLONOSCOPY WITH PROPOFOL;  Surgeon: Robert Bellow, MD;  Location: [facility] ENDOSCOPY;  Service: Endoscopy;  Laterality: N/A;  . CYSTOSCOPY WITH STENT PLACEMENT Right 09/16/2016   Procedure: CYSTOSCOPY WITH STENT PLACEMENT;  Surgeon: Royston Cowper, MD;  Location: [facility] ORS;  Service: Urology;  Laterality: Right;  . EXTRACORPOREAL SHOCK WAVE LITHOTRIPSY Left 11/04/2014   has had 2 previous lithotripsies  . EXTRACORPOREAL SHOCK WAVE LITHOTRIPSY Left 03/24/2015   Procedure: EXTRACORPOREAL SHOCK WAVE LITHOTRIPSY (ESWL);  Surgeon: Royston Cowper, MD;  Location: [facility] ORS;  Service:  Urology;  Laterality: Left;  . EXTRACORPOREAL SHOCK WAVE LITHOTRIPSY Right 10/04/2016   Procedure: EXTRACORPOREAL SHOCK WAVE LITHOTRIPSY (ESWL);  Surgeon: Royston Cowper, MD;  Location: [facility] ORS;  Service: Urology;  Laterality: Right;   Family History  Problem Relation Age of Onset  . Pulmonary embolism Mother   . Transient ischemic attack Mother   . Pancreatic cancer Father   . Hypertension Father   . Cirrhosis Brother    Allergies  Allergen Reactions  . Codeine Nausea Only    Current Outpatient Medications:  .  atorvastatin (LIPITOR) 40 MG tablet, Take 1 tablet by mouth daily., Disp: , Rfl:  .  cyclobenzaprine (FLEXERIL) 5 MG tablet, Take 1 tablet (5 mg total) by mouth 3 (three) times daily as needed for muscle spasms., Disp: 30 tablet, Rfl: 1 .  docusate sodium (COLACE) 100 MG capsule, Take 2 capsules (200 mg total) by mouth 2 (two) times daily., Disp: 120 capsule, Rfl: 3 .  finasteride (PROSCAR) 5 MG tablet, Take 5 mg by mouth every evening., Disp: , Rfl:  .  HYDROcodone-acetaminophen (NORCO) 7.5-325 MG tablet, Take 1-2 tablets by mouth every 4 (four) hours as needed for moderate pain. Maximum dose per 24 hours - 8 pills, Disp: 20 tablet, Rfl: 0 .  HYDROcodone-homatropine (HYCODAN) 5-1.5 MG/5ML syrup, Take 5 mLs by mouth every 8 (eight) hours as needed for cough., Disp: 120 mL, Rfl: 0 .  meloxicam (MOBIC) 15 MG tablet, TAKE 1 TABLET BY MOUTH ONCE A DAY, Disp: 30 tablet, Rfl: 3 .  metoprolol (LOPRESSOR) 50 MG tablet, Take 50 mg by mouth 2 (two) times daily. Morning and night, Disp: , Rfl:  .  MULTIPLE VITAMIN PO, Take 1 tablet by mouth daily., Disp: , Rfl:  .  nitroGLYCERIN (NITROSTAT) 0.4 MG SL tablet, Place 0.4 mg under the tongue every 5 (five) minutes as needed. Reported on 03/24/2015, Disp: , Rfl:  .  NUCYNTA 50 MG tablet, Take 1 tablet (50 mg total) by mouth every 6 (six) hours as needed for moderate pain. 1 TO 2 TABS Q 6 HOURS PRN PAIN, Disp: 20 tablet, Rfl: 0 .  Omega-3 Fatty  Acids (FISH OIL) 1200 MG CAPS, Take 1 capsule by mouth 2 (two) times daily., Disp: , Rfl:  .  omeprazole (PRILOSEC) 20 MG capsule, Take 20 mg by mouth daily. , Disp: , Rfl:  .  ondansetron (ZOFRAN ODT) 8 MG disintegrating tablet, Take 0.5 tablets (4 mg total) by mouth every 6 (six) hours as needed for nausea or vomiting., Disp: 4 tablet, Rfl: 3 .  ramipril (ALTACE) 10 MG capsule, Take 1 capsule by mouth daily., Disp: , Rfl:  .  tamsulosin (FLOMAX) 0.4 MG CAPS capsule, Take 1 capsule (0.4 mg total) by mouth daily after breakfast., Disp: 14 capsule, Rfl: 0 .  oseltamivir (TAMIFLU) 75 MG capsule, Take 1 capsule (75 mg total) by mouth 2 (two) times daily. (Patient not taking: Reported on 05/13/2017), Disp: 10 capsule, Rfl: 0 .  phenazopyridine (PYRIDIUM) 200 MG tablet, Take 1 tablet (200 mg total) by mouth 3 (three) times daily as needed for pain. (Patient not taking: Reported on 05/13/2017), Disp: 12 tablet, Rfl: 0 .  predniSONE (DELTASONE) 10 MG tablet, Take 1 tablet (10 mg total) by mouth daily with breakfast. Taper down by one tablet every 2 days (6,6,5,5,4,4,3,3,2,2,1,1) (Patient not taking: Reported on 05/13/2017), Disp: 42 tablet, Rfl: 0  Review of Systems  Constitutional: Positive for activity change.  Cardiovascular: Negative for chest pain, palpitations and leg swelling.  Musculoskeletal: Positive for arthralgias, back pain and myalgias.  Skin: Negative.   Neurological: Negative.    Social History   Tobacco Use  . Smoking status: Former Smoker    Packs/day: 1.00    Years: 30.00    Pack years: 30.00    Types: Cigarettes    Last attempt to quit: 03/05/1984    Years since quitting: 33.2  . Smokeless tobacco: Current User    Types: Chew  Substance Use Topics  . Alcohol use: No    Alcohol/week: 0.0 oz   Objective:   BP 120/62 (BP Location: Right Arm, Patient Position: Sitting, Cuff Size: Normal)   Pulse 72   Temp 98.1 F (36.7 C)   Resp 16  Vitals:   05/13/17 1536  BP: 120/62    Pulse: 72  Resp: 16  Temp: 98.1 F (36.7 C)   Physical Exam  Constitutional: He is oriented to person, place, and time. He appears well-developed and well-nourished. No distress.  HENT:  Head: Normocephalic and atraumatic.  Right Ear: Hearing normal.  Left Ear: Hearing normal.  Nose: Nose normal.  Mouth/Throat: Oropharynx is clear and moist.  Eyes: Conjunctivae and lids are normal. Right eye exhibits no discharge. Left eye exhibits no discharge. No scleral icterus.  Cardiovascular: Normal rate and regular rhythm.  Pulmonary/Chest: Effort normal. No respiratory distress.  Abdominal: Soft. Bowel sounds are normal.  Musculoskeletal: Normal range of motion.  Ache and occasional sharp pain in right low back radiating down the lateral thigh to the lateral calf.  Good strength. Unable to elicit DTR's both knees. Pain to test SLR's to 85 degrees.  Neurological: He is alert and oriented to person, place, and time.  Skin: Skin is intact. No lesion and no rash noted.  Psychiatric: He has a normal mood and affect. His speech is normal and behavior is normal. Thought content normal.      Assessment & Plan:     1. Chronic right-sided low back pain with right-sided sciatica Onset over the past 3-4 months. Prednisone taper helped for a day or two but pain returned and having more radiation down the right leg to the lateral calf. Occasionally feel as if leg will give away but no falls yet. Recommend he restart the Mobic and add Tramadol for pain. Apply moist heat and schedule for MRI without contrast. - MR Lumbar Spine Wo Contrast - traMADol (ULTRAM) 50 MG tablet; Take 1 tablet (50 mg total) by mouth every 8 (eight) hours as needed.  Dispense: 30 tablet; Refill: [place], PA  [facility]

## 2017-05-16 ENCOUNTER — Telehealth: Payer: Self-pay | Admitting: Family Medicine

## 2017-05-16 NOTE — Telephone Encounter (Signed)
Patient was advised. Expressed understanding.  

## 2017-05-16 NOTE — Telephone Encounter (Signed)
Ms. Woolverton called stating that Rush Landmark is in so much pain that the Tramadol is not last 8 hours.   Will you please call Mr. Boomer and let him know if he can take this more often or what he needs to do.

## 2017-05-16 NOTE — Telephone Encounter (Signed)
May increase Tramadol 50 mg to 1-2 QID and proceed with MRI of back as scheduled next week. Be sure to take the Meloxicam daily, also.

## 2017-05-20 ENCOUNTER — Other Ambulatory Visit: Payer: Self-pay | Admitting: Family Medicine

## 2017-05-20 ENCOUNTER — Telehealth: Payer: Self-pay | Admitting: Family Medicine

## 2017-05-20 DIAGNOSIS — M1611 Unilateral primary osteoarthritis, right hip: Secondary | ICD-10-CM

## 2017-05-20 DIAGNOSIS — M5440 Lumbago with sciatica, unspecified side: Principal | ICD-10-CM

## 2017-05-20 DIAGNOSIS — M545 Low back pain, unspecified: Secondary | ICD-10-CM

## 2017-05-20 MED ORDER — HYDROCODONE-ACETAMINOPHEN 5-325 MG PO TABS: 1.0000 | ORAL_TABLET | Freq: Four times a day (QID) | ORAL | 0 refills | Status: DC | PRN

## 2017-05-20 MED ORDER — CYCLOBENZAPRINE HCL 5 MG PO TABS: 5.0000 mg | ORAL_TABLET | Freq: Three times a day (TID) | ORAL | 1 refills | Status: DC | PRN

## 2017-05-20 NOTE — Telephone Encounter (Signed)
Sent prescription for Vicodin and Flexeril to help with pain and muscle spasm to the [facility]. Remind him to proceed with getting the MRI scan on his back 05-23-17.

## 2017-05-20 NOTE — Telephone Encounter (Signed)
Left patient a voicemail advising him that RX is at the pharmacy and to keep appointment for MRI.

## 2017-05-20 NOTE — Telephone Encounter (Signed)
Patient is still in a lot of pain. Only has 3 Tramadol left.  Would like to have something stronger if possible.  If not, then please call in more Tramadol.  He is taking 1-2 Tramadol every 4 hours.  Please call in  Today as he is in a lot of paion

## 2017-05-20 NOTE — Progress Notes (Signed)
Continues to have back pain with sciatica. To get MRI on 05-23-17. Tramadol and Meloxicam not controlling pain. Will give refill of Norco and Flexeril that was used a year ago.

## 2017-05-23 ENCOUNTER — Other Ambulatory Visit: Payer: Self-pay | Admitting: Family Medicine

## 2017-05-23 ENCOUNTER — Telehealth: Payer: Self-pay

## 2017-05-23 ENCOUNTER — Ambulatory Visit
Admission: RE | Admit: 2017-05-23 | Discharge: 2017-05-23 | Disposition: A | Discharge status: Home / Self Care | Payer: Medicare Other | Source: Ambulatory Visit | Attending: Family Medicine | Admitting: Family Medicine

## 2017-05-23 DIAGNOSIS — M48061 Spinal stenosis, lumbar region without neurogenic claudication: Secondary | ICD-10-CM | POA: Diagnosis not present

## 2017-05-23 DIAGNOSIS — M4804 Spinal stenosis, thoracic region: Secondary | ICD-10-CM | POA: Insufficient documentation

## 2017-05-23 DIAGNOSIS — M5441 Lumbago with sciatica, right side: Secondary | ICD-10-CM | POA: Insufficient documentation

## 2017-05-23 DIAGNOSIS — G8929 Other chronic pain: Secondary | ICD-10-CM | POA: Diagnosis not present

## 2017-05-23 DIAGNOSIS — M4316 Spondylolisthesis, lumbar region: Secondary | ICD-10-CM | POA: Diagnosis not present

## 2017-05-23 DIAGNOSIS — M47819 Spondylosis without myelopathy or radiculopathy, site unspecified: Secondary | ICD-10-CM

## 2017-05-23 NOTE — Telephone Encounter (Signed)
-----   Message from Margo Common, [place] sent at 05/23/2017  1:42 PM EDT ----- Multiple degenerative changes and mild spinal stenosis L3-L4 on MRI. If no better with present treatment, recommend referral to neurosurgeon or physiatrist for evaluation and treatment.

## 2017-05-23 NOTE — Telephone Encounter (Signed)
Placed order for referral to physiatrist.

## 2017-05-23 NOTE — Telephone Encounter (Signed)
Patient advised. He states the pain is not improving with current treatment. He prefers to proceed with referral.

## 2017-05-24 ENCOUNTER — Encounter: Payer: Self-pay | Admitting: Family Medicine

## 2017-05-24 ENCOUNTER — Ambulatory Visit (INDEPENDENT_AMBULATORY_CARE_PROVIDER_SITE_OTHER): Payer: Medicare Other | Admitting: Family Medicine

## 2017-05-24 VITALS — BP 128/62 | HR 63 | Temp 98.2°F | Wt 192.4 lb

## 2017-05-24 DIAGNOSIS — G8929 Other chronic pain: Secondary | ICD-10-CM

## 2017-05-24 DIAGNOSIS — M5441 Lumbago with sciatica, right side: Secondary | ICD-10-CM | POA: Diagnosis not present

## 2017-05-24 NOTE — Progress Notes (Signed)
Patient: Luke Rojas Male    DOB: 10-Jan-1946   72 y.o.   MRN: [id number] Visit Date: 05/24/2017  Today's Provider: Vernie Murders, PA   Chief Complaint  Patient presents with  . Back Pain    follow up    Subjective:    HPI Chronic right-sided low back pain with right-sided sciatica: Patient presents for a follow up. Last OV was on 05/13/17. Patient was advised to start Tramadol and have MRI of lumbar spine. On 05/23/17 patient had MRI done. Results showed multiple degenerative changes and mild spinal stenosis L3-L4. Patient reports yesterday symptoms were unchanged. Today he states symptoms are improved some. He wanted to discuss treatment options before proceeding with physiatry referral that was placed on 05/23/17.   Past Medical History:  Diagnosis Date  . Chronic kidney disease    kidney stones  . Coronary artery disease    2 stents   . GERD (gastroesophageal reflux disease)   . Hypercholesteremia   . Hypertension   . Myocardial infarction [facility]) 2013   Past Surgical History:  Procedure Laterality Date  . CARDIAC CATHETERIZATION  2013   2 stents  . COLONOSCOPY  04/27/05  . COLONOSCOPY WITH PROPOFOL N/A 06/08/2015   Procedure: COLONOSCOPY WITH PROPOFOL;  Surgeon: Robert Bellow, MD;  Location: [facility] ENDOSCOPY;  Service: Endoscopy;  Laterality: N/A;  . CYSTOSCOPY WITH STENT PLACEMENT Right 09/16/2016   Procedure: CYSTOSCOPY WITH STENT PLACEMENT;  Surgeon: Royston Cowper, MD;  Location: [facility] ORS;  Service: Urology;  Laterality: Right;  . EXTRACORPOREAL SHOCK WAVE LITHOTRIPSY Left 11/04/2014   has had 2 previous lithotripsies  . EXTRACORPOREAL SHOCK WAVE LITHOTRIPSY Left 03/24/2015   Procedure: EXTRACORPOREAL SHOCK WAVE LITHOTRIPSY (ESWL);  Surgeon: Royston Cowper, MD;  Location: [facility] ORS;  Service: Urology;  Laterality: Left;  . EXTRACORPOREAL SHOCK WAVE LITHOTRIPSY Right 10/04/2016   Procedure: EXTRACORPOREAL SHOCK WAVE LITHOTRIPSY (ESWL);  Surgeon: Royston Cowper, MD;  Location: [facility] ORS;  Service: Urology;  Laterality: Right;   Family History  Problem Relation Age of Onset  . Pulmonary embolism Mother   . Transient ischemic attack Mother   . Pancreatic cancer Father   . Hypertension Father   . Cirrhosis Brother    Allergies  Allergen Reactions  . Codeine Nausea Only    Current Outpatient Medications:  .  atorvastatin (LIPITOR) 40 MG tablet, Take 1 tablet by mouth daily., Disp: , Rfl:  .  cyclobenzaprine (FLEXERIL) 5 MG tablet, Take 1 tablet (5 mg total) by mouth 3 (three) times daily as needed for muscle spasms., Disp: 30 tablet, Rfl: 1 .  docusate sodium (COLACE) 100 MG capsule, Take 2 capsules (200 mg total) by mouth 2 (two) times daily., Disp: 120 capsule, Rfl: 3 .  finasteride (PROSCAR) 5 MG tablet, Take 5 mg by mouth every evening., Disp: , Rfl:  .  HYDROcodone-acetaminophen (NORCO/VICODIN) 5-325 MG tablet, Take 1 tablet by mouth every 6 (six) hours as needed for moderate pain., Disp: 30 tablet, Rfl: 0 .  meloxicam (MOBIC) 15 MG tablet, TAKE 1 TABLET BY MOUTH ONCE A DAY, Disp: 30 tablet, Rfl: 3 .  metoprolol (LOPRESSOR) 50 MG tablet, Take 50 mg by mouth 2 (two) times daily. Morning and night, Disp: , Rfl:  .  MULTIPLE VITAMIN PO, Take 1 tablet by mouth daily., Disp: , Rfl:  .  nitroGLYCERIN (NITROSTAT) 0.4 MG SL tablet, Place 0.4 mg under the tongue every 5 (five) minutes as needed. Reported on 03/24/2015,  Disp: , Rfl:  .  Omega-3 Fatty Acids (FISH OIL) 1200 MG CAPS, Take 1 capsule by mouth 2 (two) times daily., Disp: , Rfl:  .  omeprazole (PRILOSEC) 20 MG capsule, Take 20 mg by mouth daily. , Disp: , Rfl:  .  ondansetron (ZOFRAN ODT) 8 MG disintegrating tablet, Take 0.5 tablets (4 mg total) by mouth every 6 (six) hours as needed for nausea or vomiting., Disp: 4 tablet, Rfl: 3 .  ramipril (ALTACE) 10 MG capsule, Take 1 capsule by mouth daily., Disp: , Rfl:  .  tamsulosin (FLOMAX) 0.4 MG CAPS capsule, Take 1 capsule (0.4 mg total) by mouth  daily after breakfast., Disp: 14 capsule, Rfl: 0  Review of Systems  Constitutional: Negative.   Respiratory: Negative.   Cardiovascular: Negative.   Musculoskeletal: Positive for back pain.   Social History   Tobacco Use  . Smoking status: Former Smoker    Packs/day: 1.00    Years: 30.00    Pack years: 30.00    Types: Cigarettes    Last attempt to quit: 03/05/1984    Years since quitting: 33.2  . Smokeless tobacco: Current User    Types: Chew  Substance Use Topics  . Alcohol use: No    Alcohol/week: 0.0 oz   Objective:   BP 128/62 (BP Location: Right Arm, Patient Position: Sitting, Cuff Size: Normal)   Pulse 63   Temp 98.2 F (36.8 C) (Oral)   Wt 192 lb 6.4 oz (87.3 kg)   SpO2 98%   BMI 29.25 kg/m    Physical Exam  Constitutional: He is oriented to person, place, and time. He appears well-developed and well-nourished. No distress.  HENT:  Head: Normocephalic and atraumatic.  Right Ear: Hearing normal.  Left Ear: Hearing normal.  Nose: Nose normal.  Eyes: Conjunctivae and lids are normal. Right eye exhibits no discharge. Left eye exhibits no discharge. No scleral icterus.  Pulmonary/Chest: Effort normal. No respiratory distress.  Musculoskeletal: Normal range of motion. He exhibits no tenderness.  Decreased ROM in spine due to discomfort. Good muscle strength throughout.   Neurological: He is alert and oriented to person, place, and time. He has normal reflexes.  Skin: Skin is intact. No lesion and no rash noted.  Psychiatric: He has a normal mood and affect. His speech is normal and behavior is normal. Thought content normal.      Assessment & Plan:     1. Chronic right-sided low back pain with right-sided sciatica Had MRI yesterday with report of pain radiating to the right hip and leg suspected to be L4-L5 where a small right foraminal disc extrusion is superimposed on circumferential chronic disc and endplate degeneration. Subsequent moderate to severe right  foraminal stenosis. Query right L4 radiculitis. Multifactorial mild spinal stenosis at L3-L4 with up to moderate L3 neural foraminal stenosis which appears greater on the right. Grade 1 anterolisthesis at L5-S1 with severe facet degeneration and moderate to severe left L5 foraminal stenosis. Prefers no surgical intervention, so we will schedule physiatry referral. Using Norco 5/325 mg one tablet QID prn with Flexeril 5 mg one tablet TID prn. Feels this is gradually helping with back pain. Continue moist heat applications and proceed with referral. (Counseled for 25 minutes regarding MRI report and options.)       Vernie Murders, [facility]

## 2017-05-27 ENCOUNTER — Telehealth: Payer: Self-pay | Admitting: Family Medicine

## 2017-05-27 ENCOUNTER — Other Ambulatory Visit: Payer: Self-pay | Admitting: Family Medicine

## 2017-05-27 DIAGNOSIS — G8929 Other chronic pain: Secondary | ICD-10-CM

## 2017-05-27 DIAGNOSIS — M5441 Lumbago with sciatica, right side: Principal | ICD-10-CM

## 2017-05-27 NOTE — Telephone Encounter (Signed)
Pt requesting a referral for his back pain.  States he spoke with Simona Huh when he saw him last.

## 2017-05-27 NOTE — Telephone Encounter (Signed)
Referral placed. Judson Roch will contact patient with appointment time.

## 2017-05-27 NOTE — Telephone Encounter (Signed)
Work on physiatry referral as patient does not want to have neurosurgical referral yet.

## 2017-06-03 ENCOUNTER — Other Ambulatory Visit: Payer: Self-pay | Admitting: Family Medicine

## 2017-06-03 DIAGNOSIS — M1611 Unilateral primary osteoarthritis, right hip: Secondary | ICD-10-CM

## 2017-06-03 DIAGNOSIS — M545 Low back pain, unspecified: Secondary | ICD-10-CM

## 2017-06-03 DIAGNOSIS — M5440 Lumbago with sciatica, unspecified side: Principal | ICD-10-CM

## 2017-06-03 NOTE — Telephone Encounter (Signed)
Pt requesting refill of Hydrocodone 5-325 and cyclobenzaprine 5 MG sent to [facility]

## 2017-06-04 MED ORDER — HYDROCODONE-ACETAMINOPHEN 5-325 MG PO TABS: 1.0000 | ORAL_TABLET | Freq: Four times a day (QID) | ORAL | 0 refills | Status: DC | PRN

## 2017-06-04 MED ORDER — CYCLOBENZAPRINE HCL 5 MG PO TABS: 5.0000 mg | ORAL_TABLET | Freq: Three times a day (TID) | ORAL | 1 refills | Status: DC | PRN

## 2017-06-13 ENCOUNTER — Other Ambulatory Visit: Payer: Self-pay | Admitting: Family Medicine

## 2017-06-13 DIAGNOSIS — M1611 Unilateral primary osteoarthritis, right hip: Secondary | ICD-10-CM

## 2017-06-13 DIAGNOSIS — M5440 Lumbago with sciatica, unspecified side: Principal | ICD-10-CM

## 2017-06-13 DIAGNOSIS — G8929 Other chronic pain: Secondary | ICD-10-CM

## 2017-06-13 DIAGNOSIS — M545 Low back pain, unspecified: Secondary | ICD-10-CM

## 2017-06-13 DIAGNOSIS — M5441 Lumbago with sciatica, right side: Principal | ICD-10-CM

## 2017-06-13 MED ORDER — METHOCARBAMOL 750 MG PO TABS: 750.0000 mg | ORAL_TABLET | Freq: Four times a day (QID) | ORAL | 1 refills | Status: DC

## 2017-06-13 NOTE — Telephone Encounter (Signed)
Continues to have low back pain that is radiating down the right leg. Does not feel the Norco and Flexeril is helping much. Feel better when he sits down and elevates feet. Has an appointment with the physiatrist on 07-04-17. Will schedule PT evaluation and treatment. Also, will stop using the Norco and Flexeril. Give trial of Robaxin. Call report of response next week. Should continue Mobic daily, apply moist heat or Icy Hot rubs.

## 2017-06-13 NOTE — Telephone Encounter (Signed)
1. Pt is requesting Simona Huh return his call to discuss his pain level.  2.  Pt contacted office for refill request on the following medications:  1. HYDROcodone-acetaminophen (NORCO/VICODIN) 5-325 MG tablet  2. cyclobenzaprine (FLEXERIL) 5 MG tablet   [place]  Last Rx: 06/04/17  Pt stated he will be out of the medication later today. Please advise. Thanks TNP

## 2017-06-25 ENCOUNTER — Encounter: Payer: Self-pay | Admitting: Family Medicine

## 2017-06-25 ENCOUNTER — Ambulatory Visit (INDEPENDENT_AMBULATORY_CARE_PROVIDER_SITE_OTHER): Payer: Medicare Other | Admitting: Family Medicine

## 2017-06-25 VITALS — BP 134/84 | HR 68 | Ht 68.0 in | Wt 191.0 lb

## 2017-06-25 DIAGNOSIS — G8929 Other chronic pain: Secondary | ICD-10-CM | POA: Diagnosis not present

## 2017-06-25 DIAGNOSIS — I10 Essential (primary) hypertension: Secondary | ICD-10-CM | POA: Diagnosis not present

## 2017-06-25 DIAGNOSIS — M5441 Lumbago with sciatica, right side: Secondary | ICD-10-CM | POA: Diagnosis not present

## 2017-06-25 DIAGNOSIS — E782 Mixed hyperlipidemia: Secondary | ICD-10-CM | POA: Diagnosis not present

## 2017-06-25 NOTE — Progress Notes (Signed)
Patient: Luke Rojas Male    DOB: [birth year]   72 y.o.   MRN: [id number] Visit Date: 06/25/2017  Today's Provider: Vernie Murders, PA   Chief Complaint  Patient presents with  . Hypertension    3 month follow up  . Hyperlipidemia    3 month follow up   Subjective:    HPI Hypertension: Patient here for follow-up of elevated blood pressure. He is not exercising and is not adherent to low salt diet.  Blood pressure is well controlled at home. Cardiac symptoms none. Patient denies chest pain.  Cardiovascular risk factors: advanced age (older than 59 for men, 36 for women), dyslipidemia, hypertension and male gender. Use of agents associated with hypertension: NSAIDS. History of target organ damage: angina/ prior myocardial infarction. Hyperlipidemia: Patient presents with hyperlipidemia.  He was tested because hypertension and cardiovascular disease with past MI.  His last labs showed Total cholesterol of 99, HDL 27, LDL 47,  Triglycerides 127. There is not a family history of hyperlipidemia. There is not a family history of early ischemia heart disease. Chronic low back pain: No longer needing Norco or Flexeril. Switched to Robaxin and scheduled physical therapy. States the Robaxin, lumbar supporter and rubs/liniments have been helping but has return of pain intermittently. MRI on 05-23-17 showed 1. Symptomatic level with regard to pain radiating to the right hip and leg suspected to be L4-L5 where a small right foraminal disc extrusion is superimposed on circumferential chronic disc and endplate degeneration. Subsequent moderate to severe right foraminal stenosis. Query right L4 radiculitis. 2. Multifactorial mild spinal stenosis at L3-L4 with up to moderate L3 neural foraminal stenosis which appears greater on the right. 3. Grade 1 anterolisthesis at L5-S1 with severe facet degeneration and moderate to severe left L5 foraminal stenosis. 4. 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     Past Medical History:  Diagnosis Date  . Chronic kidney disease    kidney stones  . Coronary artery disease    2 stents   . GERD (gastroesophageal reflux disease)   . Hypercholesteremia   . Hypertension   . Myocardial infarction [facility]) 2013   Patient Active Problem List   Diagnosis Date Noted  . Encounter for screening colonoscopy 05/04/2015  . Arthritis 10/08/2014  . H/O acute myocardial infarction 10/08/2014  . Acid reflux 10/08/2014  . HLD (hyperlipidemia) 10/08/2014  . BP (high blood pressure) 10/08/2014  . 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 Benign neoplasm of skin 07/19/2006  . Essential (primary) hypertension 11/15/2005  . Decreased potassium in the blood 11/15/2005   Past Surgical History:  Procedure Laterality Date  . CARDIAC CATHETERIZATION  2013   2 stents  . COLONOSCOPY  04/27/05  . COLONOSCOPY WITH PROPOFOL N/A 06/08/2015   Procedure: COLONOSCOPY WITH PROPOFOL;  Surgeon: Robert Bellow, MD;  Location: [facility] ENDOSCOPY;  Service: Endoscopy;  Laterality: N/A;  . CYSTOSCOPY WITH STENT PLACEMENT Right 09/16/2016   Procedure: CYSTOSCOPY WITH STENT PLACEMENT;  Surgeon: Royston Cowper, MD;  Location: [facility] ORS;  Service: Urology;  Laterality: Right;  . EXTRACORPOREAL SHOCK WAVE LITHOTRIPSY Left 11/04/2014   has had 2 previous lithotripsies  . EXTRACORPOREAL SHOCK WAVE LITHOTRIPSY Left 03/24/2015   Procedure: EXTRACORPOREAL SHOCK WAVE LITHOTRIPSY (ESWL);  Surgeon: Royston Cowper, MD;  Location: [facility] ORS;  Service: Urology;  Laterality: Left;  .  EXTRACORPOREAL SHOCK WAVE LITHOTRIPSY Right 10/04/2016   Procedure: EXTRACORPOREAL SHOCK WAVE LITHOTRIPSY (ESWL);  Surgeon: Royston Cowper, MD;  Location: [facility] ORS;  Service: Urology;  Laterality: Right;   Family History  Problem Relation Age of Onset  .  Pulmonary embolism Mother   . Transient ischemic attack Mother   . Pancreatic cancer Father   . Hypertension Father   . Cirrhosis Brother    Allergies  Allergen Reactions  . Codeine Nausea Only    Current Outpatient Medications:  .  atorvastatin (LIPITOR) 40 MG tablet, Take 1 tablet by mouth daily., Disp: , Rfl:  .  docusate sodium (COLACE) 100 MG capsule, Take 2 capsules (200 mg total) by mouth 2 (two) times daily., Disp: 120 capsule, Rfl: 3 .  finasteride (PROSCAR) 5 MG tablet, Take 5 mg by mouth every evening., Disp: , Rfl:  .  meloxicam (MOBIC) 15 MG tablet, TAKE 1 TABLET BY MOUTH ONCE A DAY, Disp: 30 tablet, Rfl: 3 .  methocarbamol (ROBAXIN) 750 MG tablet, Take 1 tablet (750 mg total) by mouth 4 (four) times daily., Disp: 40 tablet, Rfl: 1 .  metoprolol (LOPRESSOR) 50 MG tablet, Take 50 mg by mouth 2 (two) times daily. Morning and night, Disp: , Rfl:  .  MULTIPLE VITAMIN PO, Take 1 tablet by mouth daily., Disp: , Rfl:  .  nitroGLYCERIN (NITROSTAT) 0.4 MG SL tablet, Place 0.4 mg under the tongue every 5 (five) minutes as needed. Reported on 03/24/2015, Disp: , Rfl:  .  Omega-3 Fatty Acids (FISH OIL) 1200 MG CAPS, Take 1 capsule by mouth 2 (two) times daily., Disp: , Rfl:  .  omeprazole (PRILOSEC) 20 MG capsule, Take 20 mg by mouth daily. , Disp: , Rfl:  .  ondansetron (ZOFRAN ODT) 8 MG disintegrating tablet, Take 0.5 tablets (4 mg total) by mouth every 6 (six) hours as needed for nausea or vomiting., Disp: 4 tablet, Rfl: 3 .  ramipril (ALTACE) 10 MG capsule, Take 1 capsule by mouth daily., Disp: , Rfl:  .  tamsulosin (FLOMAX) 0.4 MG CAPS capsule, Take 1 capsule (0.4 mg total) by mouth daily after breakfast., Disp: 14 capsule, Rfl: 0  Review of Systems  Constitutional: Negative.   HENT: Negative.   Eyes: Negative.   Genitourinary: Negative.   Musculoskeletal: Positive for back pain.  Neurological: Negative.     Social History   Tobacco Use  . Smoking status: Former Smoker     Packs/day: 1.00    Years: 30.00    Pack years: 30.00    Types: Cigarettes    Last attempt to quit: 03/05/1984    Years since quitting: 33.3  . Smokeless tobacco: Current User    Types: Chew  Substance Use Topics  . Alcohol use: No    Alcohol/week: 0.0 oz   Objective:   BP 134/84 (BP Location: Left Arm, Patient Position: Sitting, Cuff Size: Normal)   Pulse 68   Ht 5\' 8"  (1.727 m)   Wt 191 lb (86.6 kg)   BMI 29.04 kg/m  Vitals:   06/25/17 0824  BP: 134/84  Pulse: 68  Weight: 191 lb (86.6 kg)  Height: 5\' 8"  (1.727 m)    Physical Exam  Constitutional: He is oriented to person, place, and time. He appears well-developed and well-nourished. No distress.  HENT:  Head: Normocephalic and atraumatic.  Right Ear: Hearing normal.  Left Ear: Hearing normal.  Nose: Nose normal.  Eyes: Conjunctivae and lids are normal. Right eye exhibits no discharge. Left eye  exhibits no discharge. No scleral icterus.  Cardiovascular: Normal rate.  Pulmonary/Chest: Effort normal. No respiratory distress.  Musculoskeletal: He exhibits no tenderness.  Ache in lower back with very little radiation into the right leg now. No muscle weakness. DTR's 1-2+ and symmetric knee jerk. No numbness in extremities. SLR's to 90 degrees without pain today.  Neurological: He is alert and oriented to person, place, and time.  Skin: Skin is intact. No lesion and no rash noted.  Psychiatric: He has a normal mood and affect. His speech is normal and behavior is normal. Thought content normal.      Assessment & Plan:     1. Chronic right-sided low back pain with right-sided sciatica Having less intense pain and less frequent radiation from the right lower back to the right lower leg. No muscle weakness or numbness. Getting enough relief from Robaxin, Meloxicam, back rubs/liniment, moist heat/ice pack applications and use of a lumbar brace/supporter that he will occasionally try to do small tasks in his yard. Scheduled for  physical therapy next week and evaluation at a pain clinic. He has a copy of his MRI to take with him to that appointment on 07-04-17. Continue present treatment with hope of further improvement.  2. Essential (primary) hypertension Well controlled and tolerating Ramipril 10 mg qd with Metoprolol Tartrate 50 mg BID. No chest pains, palpitations, edema or dyspnea. Check CBC and CMP. Follow up pending lab reports. - CBC with Differential/Platelet - Comprehensive metabolic panel  3. Mixed hyperlipidemia Has been trying to follow a low fat diet but unable to exercise much due to back pain. Added Omega-3 Fish Oil 1200 mg BID in January 2019 to the Lipitor 40 mg qd without side effects. Will check CMP and lipid panel to assess progress.  - Comprehensive metabolic panel - Lipid panel       Vernie Murders, PA  [facility]

## 2017-06-28 LAB — COMPREHENSIVE METABOLIC PANEL
ALBUMIN: 4.1 g/dL (ref 3.5–4.8)
ALK PHOS: 79 IU/L (ref 39–117)
ALT: 31 IU/L (ref 0–44)
AST: 22 IU/L (ref 0–40)
Albumin/Globulin Ratio: 1.6 (ref 1.2–2.2)
BUN / CREAT RATIO: 10 (ref 10–24)
BUN: 13 mg/dL (ref 8–27)
Bilirubin Total: 0.5 mg/dL (ref 0.0–1.2)
CO2: 21 mmol/L (ref 20–29)
Calcium: 9.3 mg/dL (ref 8.6–10.2)
Chloride: 102 mmol/L (ref 96–106)
Creatinine, Ser: 1.24 mg/dL (ref 0.76–1.27)
GFR calc Af Amer: 67 mL/min/{1.73_m2} (ref >59)
GFR calc non Af Amer: 58 mL/min/{1.73_m2} — ABNORMAL LOW (ref >59)
GLUCOSE: 135 mg/dL — AB (ref 65–99)
Globulin, Total: 2.6 g/dL (ref 1.5–4.5)
Potassium: 4.4 mmol/L (ref 3.5–5.2)
Sodium: 140 mmol/L (ref 134–144)
TOTAL PROTEIN: 6.7 g/dL (ref 6.0–8.5)

## 2017-06-28 LAB — CBC WITH DIFFERENTIAL/PLATELET
Basophils Absolute: 0 10*3/uL (ref 0.0–0.2)
Basos: 1 %
EOS (ABSOLUTE): 0.4 10*3/uL (ref 0.0–0.4)
EOS: 5 %
HEMATOCRIT: 44.3 % (ref 37.5–51.0)
HEMOGLOBIN: 14.9 g/dL (ref 13.0–17.7)
IMMATURE GRANS (ABS): 0 10*3/uL (ref 0.0–0.1)
IMMATURE GRANULOCYTES: 0 %
LYMPHS: 35 %
Lymphocytes Absolute: 2.4 10*3/uL (ref 0.7–3.1)
MCH: 29.2 pg (ref 26.6–33.0)
MCHC: 33.6 g/dL (ref 31.5–35.7)
MCV: 87 fL (ref 79–97)
Monocytes Absolute: 0.7 10*3/uL (ref 0.1–0.9)
Monocytes: 10 %
NEUTROS PCT: 49 %
Neutrophils Absolute: 3.5 10*3/uL (ref 1.4–7.0)
Platelets: 178 10*3/uL (ref 150–379)
RBC: 5.1 x10E6/uL (ref 4.14–5.80)
RDW: 15.7 % — ABNORMAL HIGH (ref 12.3–15.4)
WBC: 7 10*3/uL (ref 3.4–10.8)

## 2017-06-28 LAB — LIPID PANEL
CHOLESTEROL TOTAL: 101 mg/dL (ref 100–199)
Chol/HDL Ratio: 3.6 ratio (ref 0.0–5.0)
HDL: 28 mg/dL — AB (ref >39)
LDL CALC: 41 mg/dL (ref 0–99)
Triglycerides: 160 mg/dL — ABNORMAL HIGH (ref 0–149)
VLDL CHOLESTEROL CAL: 32 mg/dL (ref 5–40)

## 2017-06-30 LAB — SPECIMEN STATUS REPORT

## 2017-06-30 LAB — HGB A1C W/O EAG: Hgb A1c MFr Bld: 6.7 % — ABNORMAL HIGH (ref 4.8–5.6)

## 2017-07-01 ENCOUNTER — Telehealth: Payer: Self-pay

## 2017-07-01 NOTE — Telephone Encounter (Signed)
Patient advised. Follow up appointment scheduled.

## 2017-07-01 NOTE — Telephone Encounter (Signed)
-----   Message from Margo Common, [place] sent at 06/28/2017 10:19 AM EDT ----- Triglycerides slightly elevated and HDL is too low. Continue Omega-3 Fish Oil 1200 mg BID and add Co-Q 10 100 mg qd. Blood sugar elevated - ask lab to run Hgb A1C.

## 2017-07-01 NOTE — Telephone Encounter (Signed)
-----   Message from Margo Common, [place] sent at 07/01/2017  8:22 AM EDT ----- Hgb A1C confirms diabetes. Schedule follow up appointment in the next week to get treatment program started and schedule diabetes education with [facility].

## 2017-07-03 ENCOUNTER — Ambulatory Visit: Payer: Medicare Other | Attending: Family Medicine

## 2017-07-03 DIAGNOSIS — M545 Low back pain: Secondary | ICD-10-CM | POA: Insufficient documentation

## 2017-07-03 DIAGNOSIS — R262 Difficulty in walking, not elsewhere classified: Secondary | ICD-10-CM | POA: Diagnosis present

## 2017-07-03 DIAGNOSIS — M5416 Radiculopathy, lumbar region: Secondary | ICD-10-CM | POA: Diagnosis present

## 2017-07-04 ENCOUNTER — Encounter: Payer: Self-pay | Admitting: Student in an Organized Health Care Education/Training Program

## 2017-07-04 ENCOUNTER — Other Ambulatory Visit: Payer: Self-pay

## 2017-07-04 ENCOUNTER — Ambulatory Visit
Payer: Medicare Other | Attending: Student in an Organized Health Care Education/Training Program | Admitting: Student in an Organized Health Care Education/Training Program

## 2017-07-04 VITALS — BP 143/82 | HR 61 | Temp 97.6°F | Resp 16 | Ht 68.0 in | Wt 185.0 lb

## 2017-07-04 DIAGNOSIS — M5134 Other intervertebral disc degeneration, thoracic region: Secondary | ICD-10-CM | POA: Insufficient documentation

## 2017-07-04 DIAGNOSIS — E7801 Familial hypercholesterolemia: Secondary | ICD-10-CM | POA: Diagnosis not present

## 2017-07-04 DIAGNOSIS — N189 Chronic kidney disease, unspecified: Secondary | ICD-10-CM | POA: Insufficient documentation

## 2017-07-04 DIAGNOSIS — M47816 Spondylosis without myelopathy or radiculopathy, lumbar region: Secondary | ICD-10-CM | POA: Insufficient documentation

## 2017-07-04 DIAGNOSIS — Z791 Long term (current) use of non-steroidal anti-inflammatories (NSAID): Secondary | ICD-10-CM | POA: Diagnosis not present

## 2017-07-04 DIAGNOSIS — I129 Hypertensive chronic kidney disease with stage 1 through stage 4 chronic kidney disease, or unspecified chronic kidney disease: Secondary | ICD-10-CM | POA: Diagnosis not present

## 2017-07-04 DIAGNOSIS — M1288 Other specific arthropathies, not elsewhere classified, other specified site: Secondary | ICD-10-CM | POA: Diagnosis not present

## 2017-07-04 DIAGNOSIS — M4804 Spinal stenosis, thoracic region: Secondary | ICD-10-CM | POA: Diagnosis not present

## 2017-07-04 DIAGNOSIS — G894 Chronic pain syndrome: Secondary | ICD-10-CM | POA: Diagnosis present

## 2017-07-04 DIAGNOSIS — Z79899 Other long term (current) drug therapy: Secondary | ICD-10-CM | POA: Insufficient documentation

## 2017-07-04 DIAGNOSIS — M545 Low back pain: Secondary | ICD-10-CM | POA: Insufficient documentation

## 2017-07-04 DIAGNOSIS — Z885 Allergy status to narcotic agent status: Secondary | ICD-10-CM | POA: Diagnosis not present

## 2017-07-04 DIAGNOSIS — I2510 Atherosclerotic heart disease of native coronary artery without angina pectoris: Secondary | ICD-10-CM | POA: Insufficient documentation

## 2017-07-04 DIAGNOSIS — M48061 Spinal stenosis, lumbar region without neurogenic claudication: Secondary | ICD-10-CM | POA: Diagnosis not present

## 2017-07-04 DIAGNOSIS — M9982 Other biomechanical lesions of thoracic region: Secondary | ICD-10-CM

## 2017-07-04 DIAGNOSIS — I252 Old myocardial infarction: Secondary | ICD-10-CM | POA: Diagnosis not present

## 2017-07-04 DIAGNOSIS — Z87891 Personal history of nicotine dependence: Secondary | ICD-10-CM | POA: Diagnosis not present

## 2017-07-04 DIAGNOSIS — M9983 Other biomechanical lesions of lumbar region: Secondary | ICD-10-CM

## 2017-07-04 DIAGNOSIS — M5416 Radiculopathy, lumbar region: Secondary | ICD-10-CM | POA: Insufficient documentation

## 2017-07-04 DIAGNOSIS — K219 Gastro-esophageal reflux disease without esophagitis: Secondary | ICD-10-CM | POA: Insufficient documentation

## 2017-07-04 DIAGNOSIS — M5116 Intervertebral disc disorders with radiculopathy, lumbar region: Secondary | ICD-10-CM | POA: Insufficient documentation

## 2017-07-04 NOTE — Progress Notes (Signed)
Patient's Name: Luke Rojas  MRN: [id number]  Referring Provider: Margo Common, [place]  DOB: [birth year]  PCP: Margo Common, PA  DOS: 07/04/2017  Note by: Gillis Santa, MD  Service setting: Ambulatory outpatient  Specialty: Interventional Pain Management  Location: [facility] (AMB) Pain Management Facility  Visit type: Initial Patient Evaluation  Patient type: New Patient   Primary Reason(s) for Visit: Encounter for initial evaluation of one or more chronic problems (new to examiner) potentially causing chronic pain, and posing a threat to normal musculoskeletal function. (Level of risk: High) CC: Back Pain (lower)  HPI  Luke Rojas is a 72 y.o. year old, male patient, who comes today to see [place] for the first time for an initial evaluation of his chronic pain. 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 Today he comes in for evaluation of his Back Pain (lower)  Pain Assessment: Location: Lower Back Radiating: pain had been moving through right hip down back of right leg to top of foot, not including toes Onset: More than a month ago Duration: Chronic pain Quality: Burning, Shooting Severity: 0-No pain/10 (self-reported pain score)  Note: Reported level is compatible with observation.                         When using our objective Pain Scale, levels between 6 and 10/10 are said to belong in an emergency room, as it progressively worsens from a 6/10, described as severely limiting, requiring emergency care not usually  available at an outpatient pain management facility. At a 6/10 level, communication becomes difficult and requires great effort. Assistance to reach the emergency department may be required. Facial flushing and profuse sweating along with potentially dangerous increases in heart rate and blood pressure will be evident. Effect on ADL: Pt states until last Thursday, pain had been so bad that he stayed inside all day sitting. Since Last Thursday, pt states he has had no pain "whatsoever". Timing:   Modifying factors: arctic ice, medications  Onset and Duration: Sudden Cause of pain: Spurs against nerve. Severity: No change since onset, NAS-11 at its worse: 10/10, NAS-11 at its best: 5/10, NAS-11 now: 8/10 and NAS-11 on the average: 8/10 Timing: Not influenced by the time of the day Aggravating Factors: Walking Alleviating Factors: Warm showers or baths Associated Problems: Pain that wakes patient up Quality of Pain: Aching, Sharp, Stabbing and Uncomfortable Previous Examinations or Tests: MRI scan and X-rays Previous Treatments: Narcotic medications  The patient comes into the clinics today for the first time for a chronic pain management evaluation.   Patient presents with a chief complaint of axial low back pain that would radiate down to his right hip, right leg and right foot.  He states that he was prescribed Robaxin 750 mg to take 4 times a day which significantly helped his axial low back pain.  He states that 4 days ago, he notices complete resolution of his axial low back pain.  Today he is not endorsing any pain.  Today I took the time to provide the patient with information regarding my pain practice. The patient was informed  that my practice is divided into two sections: an interventional pain management section, as well as a completely separate and distinct medication management section. I explained that I have procedure days for my interventional therapies, and evaluation days for  follow-ups and medication management. Because of the amount of documentation required during both, they are kept separated. This means that there is the possibility that he may be scheduled for a procedure on one day, and medication management the next. I have also informed him that because of staffing and facility limitations, I no longer take patients for medication management only. To illustrate the reasons for this, I gave the patient the example of surgeons, and how inappropriate it would be to refer a patient to his/her care, just to write for the post-surgical antibiotics on a surgery done by a different surgeon.   Because interventional pain management is my board-certified specialty, the patient was informed that joining my practice means that they are open to any and all interventional therapies. I made it clear that this does not mean that they will be forced to have any procedures done. What this means is that I believe interventional therapies to be essential part of the diagnosis and proper management of chronic pain conditions. Therefore, patients not interested in these interventional alternatives will be better served under the care of a different practitioner.  The patient was also made aware of my Comprehensive Pain Management Safety Guidelines where by joining my practice, they limit all of their nerve blocks and joint injections to those done by our practice, for as long as we are retained to manage their care.   Meds   Current Outpatient Medications:  .  atorvastatin (LIPITOR) 40 MG tablet, Take 1 tablet by mouth daily., Disp: , Rfl:  .  co-enzyme Q-10 30 MG capsule, Take 30 mg by mouth 3 (three) times daily., Disp: , Rfl:  .  docusate sodium (COLACE) 100 MG capsule, Take 2 capsules (200 mg total) by mouth 2 (two) times daily., Disp: 120 capsule, Rfl: 3 .  finasteride (PROSCAR) 5 MG tablet, Take 5 mg by mouth every evening., Disp: , Rfl:  .  meloxicam (MOBIC) 15 MG tablet, TAKE 1  TABLET BY MOUTH ONCE A DAY, Disp: 30 tablet, Rfl: 3 .  methocarbamol (ROBAXIN) 750 MG tablet, Take 1 tablet (750 mg total) by mouth 4 (four) times daily., Disp: 40 tablet, Rfl: 1 .  metoprolol (LOPRESSOR) 50 MG tablet, Take 50 mg by mouth 2 (two) times daily. Morning and night, Disp: , Rfl:  .  MULTIPLE VITAMIN PO, Take 1 tablet by mouth daily., Disp: , Rfl:  .  nitroGLYCERIN (NITROSTAT) 0.4 MG SL tablet, Place 0.4 mg under the tongue every 5 (five) minutes as needed. Reported on 03/24/2015, Disp: , Rfl:  .  Omega-3 Fatty Acids (FISH OIL) 1200 MG CAPS, Take 1 capsule by mouth 2 (two) times daily., Disp: , Rfl:  .  omeprazole (PRILOSEC) 20 MG capsule, Take 20 mg by mouth daily. , Disp: , Rfl:  .  ondansetron (ZOFRAN ODT) 8 MG disintegrating tablet, Take 0.5 tablets (4 mg total) by mouth every 6 (six) hours as needed for nausea or vomiting., Disp: 4 tablet, Rfl: 3 .  ramipril (ALTACE) 10 MG capsule, Take 1 capsule by mouth daily., Disp: , Rfl:  .  tamsulosin (FLOMAX) 0.4 MG CAPS capsule, Take 1 capsule (0.4 mg total) by mouth daily after breakfast., Disp: 14 capsule, Rfl: 0  Imaging Review  Lumbosacral Imaging: Lumbar MR  wo contrast:  Results for orders placed in visit on 05/13/17  MR Lumbar Spine Wo Contrast   Narrative CLINICAL DATA:  72 year old male with lumbar back pain radiating to the right hip leg and foot for several months with no known injury.  EXAM: MRI LUMBAR SPINE WITHOUT CONTRAST  TECHNIQUE: Multiplanar, multisequence MR imaging of the lumbar spine was performed. No intravenous contrast was administered.  COMPARISON:  Lumbar radiographs 03/22/2017. CT Abdomen and Pelvis 04/18/2016  FINDINGS: Segmentation:  Normal on the comparison radiographs.  Alignment: Grade 1 anterolisthesis of L5 on S1 is stable since February 2018 measuring 4-5 millimeters. Stable subtle anterolisthesis of L3 on L4. Overall chronic straightening of lumbar lordosis.  Vertebrae: No marrow edema or  evidence of acute osseous abnormality. Visualized bone marrow signal is within normal limits. Intact visible sacrum and SI joints.  Conus medullaris and cauda equina: Conus extends to the T12 level. No signal abnormality in the lower thoracic spinal cord or conus.  Paraspinal and other soft tissues: Benign right renal upper pole cyst redemonstrated. Otherwise negative visible abdominal viscera. Negative visualized posterior paraspinal soft tissues.  Disc levels:  T10-T11: Partially visible chronic posterior element hypertrophy, moderate to severe on the left side. Borderline to mild spinal stenosis and mild to moderate left T10 foraminal stenosis appears stable since 2018.  T11-T12: Minor disc bulge.  Small chronic Schmorl's nodes.  T12-L1:  Negative.  L1-L2: Disc desiccation with circumferential disc bulge. Mild facet hypertrophy. No stenosis.  L2-L3: Disc space loss. Chronic vacuum disc at this level. Circumferential disc bulge and endplate spurring although more pronounced far laterally than posteriorly. No spinal or lateral recess stenosis. No convincing foraminal stenosis.  L3-L4: Disc desiccation with circumferential disc bulge, also most pronounced far laterally in greater on the left. Broad-based posterior and bilateral foraminal involvement. Mild to moderate facet and ligament flavum hypertrophy. Trace degenerative facet joint fluid. Mild spinal stenosis. No convincing lateral recess stenosis. Mild to moderate bilateral L3 neural foraminal stenosis, perhaps greater on the right.  L4-L5: Circumferential disc bulge and endplate spurring. Overall disc material appears mildly eccentric to the left but there is a small superimposed right foraminal disc extrusion best seen on series 2, image 5 and series 5, image 26. Superimposed moderate facet and ligament flavum hypertrophy. No significant spinal stenosis. Mild bilateral lateral recess stenosis at the L5  levels. Moderate left and moderate to severe right L4 neural foraminal stenosis.  L5-S1: Circumferential disc bulge eccentric to the left. Severe bilateral facet hypertrophy. Degenerative facet joint fluid greater on the right. However, no spinal or lateral recess stenosis. There is moderate to severe left and mild right L5 neural foraminal stenosis.  IMPRESSION: 1. Symptomatic level with regard to pain radiating to the right hip and leg suspected to be L4-L5 where a small right foraminal disc extrusion is superimposed on circumferential chronic disc and endplate degeneration. Subsequent moderate to severe right foraminal stenosis. Query right L4 radiculitis. 2. Multifactorial mild spinal stenosis at L3-L4 with up to moderate L3 neural foraminal stenosis which appears greater on the right. 3. Grade 1 anterolisthesis at L5-S1 with severe facet degeneration and moderate to severe left L5 foraminal stenosis. 4. Chronic moderate to severe lower thoracic spine posterior element degeneration at T10-T11 with up to mild lower thoracic spinal stenosis and moderate left T10 foraminal stenosis.   Electronically Signed   By: Genevie Ann M.D.   On: 05/23/2017 12:11   Lumbar DG (Complete) 4+V:  Results for orders placed in visit on  03/22/17  DG Lumbar Spine Complete   Narrative CLINICAL DATA:  72 year old male with lumbar back pain radiating to the right lower extremity for 1-2 months with no known injury.  EXAM: LUMBAR SPINE - COMPLETE 4+ VIEW  COMPARISON:  CT Abdomen and Pelvis 04/18/2016.  FINDINGS: Calcified aortic atherosclerosis. Normal lumbar segmentation. Stable lumbar vertebral height and alignment. Widespread lumbar degenerative endplate spurring. Mild to moderate disc space loss at L1-L2, L2-L3, L4-L5. Chronic vacuum disc at L2-L3. No pars fracture. Moderate to severe lower lumbar facet hypertrophy, with vacuum facet phenomena demonstrated on the comparison CT at L5-S1. Sacral  ala and SI joints appear normal. Visible lower thoracic levels appear intact. Visible pelvis intact.  IMPRESSION: 1.  No acute osseous abnormality in the lumbar spine. 2. Chronic lumbar spine degeneration, including advanced disc degeneration at L2-L3 and advanced facet degeneration at L5-S1. 3.  Aortic Atherosclerosis (ICD10-I70.0).   Electronically Signed   By: Genevie Ann M.D.   On: 03/22/2017 12:45   Hip-R DG 2-3 views:  Results for orders placed in visit on 03/22/17  DG HIP [place] 2-3 VIEWS RIGHT   Narrative CLINICAL DATA:  72 year old male with lumbar back pain radiating to the right lower extremity for 1-2 months with no known injury.  EXAM: DG HIP (WITH OR WITHOUT PELVIS) 2-3V RIGHT  COMPARISON:  Lumbar radiographs today reported separately. Pelvis and right hip series 03/25/2016.  FINDINGS: Bone mineralization is stable and normal for age. Femoral heads remain normally located. Stable hip joint spaces. Asymmetric right acetabular osteophytosis. Stable and intact proximal right femur. SI joints remain normal. No acute osseous abnormality identified. Small lower pelvic vascular calcifications again noted.  IMPRESSION: Mild right hip osteoarthritis appears stable since 2018. No acute osseous abnormality about the right hip or pelvis.   Electronically Signed   By: Genevie Ann M.D.   On: 03/22/2017 12:47    Complexity Note: Imaging results reviewed. Results shared with Mr. Luce, using Layman's terms.                         ROS  Cardiovascular: Daily Aspirin intake, High blood pressure, Heart failure and Blood thinners:  Antiplatelet Pulmonary or Respiratory: Snoring  Neurological: No reported neurological signs or symptoms such as seizures, abnormal skin sensations, urinary and/or fecal incontinence, being born with an abnormal open spine and/or a tethered spinal cord Review of Past Neurological Studies: No results found for this or any previous  visit. Psychological-Psychiatric: No reported psychological or psychiatric signs or symptoms such as difficulty sleeping, anxiety, depression, delusions or hallucinations (schizophrenial), mood swings (bipolar disorders) or suicidal ideations or attempts Gastrointestinal: No reported gastrointestinal signs or symptoms such as vomiting or evacuating blood, reflux, heartburn, alternating episodes of diarrhea and constipation, inflamed or scarred liver, or pancreas or irrregular and/or infrequent bowel movements Genitourinary: Passing kidney stones, Peeing blood and Recurrent Urinary Tract infections Hematological: Brusing easily Endocrine: No reported endocrine signs or symptoms such as high or low blood sugar, rapid heart rate due to high thyroid levels, obesity or weight gain due to slow thyroid or thyroid disease Rheumatologic: No reported rheumatological signs and symptoms such as fatigue, joint pain, tenderness, swelling, redness, heat, stiffness, decreased range of motion, with or without associated rash Musculoskeletal: Negative for myasthenia gravis, muscular dystrophy, multiple sclerosis or malignant hyperthermia Work History: Retired  Allergies  Mr. Wilczak is allergic to codeine.  Laboratory Chemistry  Inflammation Markers (CRP: Acute Phase) (ESR: Chronic Phase) Lab Results  Component Value Date   ESRSEDRATE 33 (H) 03/22/2017                         Rheumatology Markers No results found for: RF, ANA, Therisa [place], [facility]                      Renal Function Markers Lab Results  Component Value Date   BUN 13 06/27/2017   CREATININE 1.24 06/27/2017   GFRAA 67 06/27/2017   GFRNONAA 58 (L) 06/27/2017                              Hepatic Function Markers Lab Results  Component Value Date   AST 22 06/27/2017   ALT 31 06/27/2017   ALBUMIN 4.1 06/27/2017   ALKPHOS 79 06/27/2017   LIPASE 180 07/09/2011                        Electrolytes Lab Results   Component Value Date   NA 140 06/27/2017   K 4.4 06/27/2017   CL 102 06/27/2017   CALCIUM 9.3 06/27/2017                        Neuropathy Markers Lab Results  Component Value Date   HGBA1C 6.7 (H) 06/27/2017                        Bone Pathology Markers No results found for: [place], [id number], [id number], [id number], 25OHVITD1, 25OHVITD2, 25OHVITD3, TESTOFREE, TESTOSTERONE                       Coagulation Parameters Lab Results  Component Value Date   PLT 178 06/27/2017                        Cardiovascular Markers Lab Results  Component Value Date   HGB 14.9 06/27/2017   HCT 44.3 06/27/2017                         CA Markers No results found for: CEA, CA125, LABCA2                      Note: Lab results reviewed.  [place]  Drug: Mr. Mcgrail  reports that he does not use drugs. Alcohol:  reports that he does not drink alcohol. Tobacco:  reports that he quit smoking about 33 years ago. His smoking use included cigarettes. He has a 30.00 pack-year smoking history. His smokeless tobacco use includes chew. Medical:  has a past medical history of Chronic kidney disease, Coronary artery disease, GERD (gastroesophageal reflux disease), Hypercholesteremia, Hypertension, and Myocardial infarction ([place]) (2013). Family: family history includes Cirrhosis in his brother; Hypertension in his father; Pancreatic cancer in his father; Pulmonary embolism in his mother; Transient ischemic attack in his mother.  Past Surgical History:  Procedure Laterality Date  . CARDIAC CATHETERIZATION  2013   2 stents  . COLONOSCOPY  04/27/05  . COLONOSCOPY WITH PROPOFOL N/A 06/08/2015   Procedure: COLONOSCOPY WITH PROPOFOL;  Surgeon: Robert Bellow, MD;  Location: [facility] ENDOSCOPY;  Service: Endoscopy;  Laterality: N/A;  . CYSTOSCOPY WITH STENT PLACEMENT Right 09/16/2016   Procedure: CYSTOSCOPY WITH STENT PLACEMENT;  Surgeon: Royston Cowper, MD;  Location:  [facility] ORS;  Service: Urology;  Laterality:  Right;  . EXTRACORPOREAL SHOCK WAVE LITHOTRIPSY Left 11/04/2014   has had 2 previous lithotripsies  . EXTRACORPOREAL SHOCK WAVE LITHOTRIPSY Left 03/24/2015   Procedure: EXTRACORPOREAL SHOCK WAVE LITHOTRIPSY (ESWL);  Surgeon: Royston Cowper, MD;  Location: [facility] ORS;  Service: Urology;  Laterality: Left;  . EXTRACORPOREAL SHOCK WAVE LITHOTRIPSY Right 10/04/2016   Procedure: EXTRACORPOREAL SHOCK WAVE LITHOTRIPSY (ESWL);  Surgeon: Royston Cowper, MD;  Location: [facility] ORS;  Service: Urology;  Laterality: Right;   Active Ambulatory Problems    Diagnosis Date Noted  . Arthritis 10/08/2014  . Essential (primary) hypertension 11/15/2005  . H/O acute myocardial infarction 10/08/2014  . 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 Decreased potassium in the blood 11/15/2005  . Benign neoplasm of skin 07/19/2006  . Acid reflux 10/08/2014  . HLD (hyperlipidemia) 10/08/2014  . BP (high blood pressure) 10/08/2014  . Encounter for screening colonoscopy 05/04/2015  . Lumbar radiculopathy (R-sided) L4 07/04/2017  . 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 Lumbar facet arthropathy 07/04/2017  . 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 Foraminal stenosis of thoracic region 07/04/2017  . Chronic pain syndrome 07/04/2017   Resolved Ambulatory Problems    Diagnosis Date Noted  . No Resolved Ambulatory Problems   Past Medical History:  Diagnosis Date  . Chronic kidney disease   . Coronary artery disease   . GERD (gastroesophageal reflux disease)   . Hypercholesteremia   . Hypertension   . Myocardial infarction [facility]) 2013   Constitutional Exam  General appearance: Well nourished, well developed, and well hydrated. In no apparent acute distress Vitals:   07/04/17 1044  BP: (!) 143/82  Pulse: 61  Resp: 16  Temp: 97.6 F (36.4 C)  TempSrc: Oral  SpO2: 99%  Weight: 185 lb (83.9 kg)  Height: _0  (1.727 m)   BMI Assessment: Estimated body mass  index is 28.13 kg/m as calculated from the following:   Height as of this encounter: _1  (1.727 m).   Weight as of this encounter: 185 lb (83.9 kg).  BMI interpretation table: BMI level Category Range association with higher incidence of chronic pain  <18 kg/m2 Underweight   18.5-24.9 kg/m2 Ideal body weight   25-29.9 kg/m2 Overweight Increased incidence by 20%  30-34.9 kg/m2 Obese (Class I) Increased incidence by 68%  35-39.9 kg/m2 Severe obesity (Class II) Increased incidence by 136%  >40 kg/m2 Extreme obesity (Class III) Increased incidence by 254%   Patient's current BMI Ideal Body weight  Body mass index is 28.13 kg/m. Ideal body weight: 68.4 kg (150 lb 12.7 oz) Adjusted ideal body weight: 74.6 kg (164 lb 7.6 oz)   BMI Readings from Last 4 Encounters:  07/04/17 28.13 kg/m  06/25/17 29.04 kg/m  05/24/17 29.25 kg/m  04/18/17 30.04 kg/m   Wt Readings from Last 4 Encounters:  07/04/17 185 lb (83.9 kg)  06/25/17 191 lb (86.6 kg)  05/24/17 192 lb 6.4 oz (87.3 kg)  04/18/17 197 lb 9.6 oz (89.6 kg)  Psych/Mental status: Alert, oriented x 3 (person, place, & time)       Eyes: PERLA Respiratory: No evidence of acute respiratory distress  Cervical Spine Area Exam  Skin & Axial Inspection: No masses, redness, edema, swelling, or associated skin lesions Alignment: Symmetrical Functional ROM: Unrestricted ROM      Stability: No instability detected Muscle Tone/Strength: Functionally intact. No obvious neuro-muscular anomalies detected. Sensory (Neurological): Unimpaired Palpation: No palpable anomalies  Upper Extremity (UE) Exam    Side: Right upper extremity  Side: Left upper extremity  Skin & Extremity Inspection: Skin color, temperature, and hair growth are WNL. No peripheral edema or cyanosis. No masses, redness, swelling, asymmetry, or associated skin lesions. No contractures.  Skin & Extremity Inspection: Skin color, temperature, and hair growth are WNL. No  peripheral edema or cyanosis. No masses, redness, swelling, asymmetry, or associated skin lesions. No contractures.  Functional ROM: Unrestricted ROM          Functional ROM: Unrestricted ROM          Muscle Tone/Strength: Functionally intact. No obvious neuro-muscular anomalies detected.  Muscle Tone/Strength: Functionally intact. No obvious neuro-muscular anomalies detected.  Sensory (Neurological): Unimpaired          Sensory (Neurological): Unimpaired          Palpation: No palpable anomalies              Palpation: No palpable anomalies              Specialized Test(s): Deferred         Specialized Test(s): Deferred          Thoracic Spine Area Exam  Skin & Axial Inspection: No masses, redness, or swelling Alignment: Symmetrical Functional ROM: Unrestricted ROM Stability: No instability detected Muscle Tone/Strength: Functionally intact. No obvious neuro-muscular anomalies detected. Sensory (Neurological): Unimpaired Muscle strength & Tone: No palpable anomalies  Lumbar Spine Area Exam  Skin & Axial Inspection: No masses, redness, or swelling Alignment: Symmetrical Functional ROM: Unrestricted ROM       Stability: No instability detected Muscle Tone/Strength: Functionally intact. No obvious neuro-muscular anomalies detected. Sensory (Neurological): Unimpaired Palpation: No palpable anomalies       Provocative Tests: Lumbar Hyperextension and rotation test: evaluation deferred today       Lumbar Lateral bending test: evaluation deferred today       Patrick's Maneuver: evaluation deferred today                    Gait & Posture Assessment  Ambulation: Unassisted Gait: Relatively normal for age and body habitus Posture: WNL   Lower Extremity Exam    Side: Right lower extremity  Side: Left lower extremity  Skin & Extremity Inspection: Skin color, temperature, and hair growth are WNL. No peripheral edema or cyanosis. No masses, redness, swelling, asymmetry, or associated skin  lesions. No contractures.  Skin & Extremity Inspection: Skin color, temperature, and hair growth are WNL. No peripheral edema or cyanosis. No masses, redness, swelling, asymmetry, or associated skin lesions. No contractures.  Functional ROM: Unrestricted ROM          Functional ROM: Unrestricted ROM          Muscle Tone/Strength: Functionally intact. No obvious neuro-muscular anomalies detected.  Muscle Tone/Strength: Functionally intact. No obvious neuro-muscular anomalies detected.  Sensory (Neurological): Unimpaired  Sensory (Neurological): Unimpaired  Palpation: No palpable anomalies  Palpation: No palpable anomalies   Assessment  Primary Diagnosis & Pertinent Problem List: The primary encounter diagnosis was Lumbar radiculopathy (R-sided) L4. 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  Visit Diagnosis (New problems to examiner): 1. Lumbar radiculopathy (R-sided) L4   2. 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 Lumbar facet arthropathy   4. Thoracic degenerative disc disease   5. Foraminal stenosis of  thoracic region   6. Chronic pain syndrome   General Recommendations: The pain condition that the patient suffers from is best treated with a multidisciplinary approach that involves an increase in physical activity to prevent de-conditioning and worsening of the pain cycle, as well as psychological counseling (formal and/or informal) to address the co-morbid psychological affects of pain. Treatment will often involve judicious use of pain medications and interventional procedures to decrease the pain, allowing the patient to participate in the physical activity that will ultimately produce long-lasting pain reductions. The goal of the multidisciplinary approach is to return the patient to a higher level of  overall function and to restore their ability to perform activities of daily living.  72 year old male who presents with a previous complaint of axial low back pain that radiated down his right leg in a dermatomal fashion.  Patient's axial low back pain that radiates down to his left leg is secondary lumbar radiculopathy at L4, foraminal stenosis right greater than left at L3, lumbar facet arthropathy, lumbar degenerative disc disease.  Patient states that his primary care physician gave him Robaxin 750 mg 3-4 times a day which significantly improved his back pain.  Today he is not endorsing any back pain or radiating leg pain.  I instructed the patient that should his pain symptoms start to return, to utilize Robaxin again.  If this is not beneficial, we discussed PRN lumbar ESI to help out with his symptoms of right lumbar radiculopathy.  Risks and benefits were discussed.  We will place PRN order for lumbar ESI on the right at L4-L5 should the patient have return of symptoms that are refractory to Robaxin.   Plan of Care (Initial workup plan)  Note: Please be advised that as per protocol, today's visit has been an evaluation only. We have not taken over the patient's controlled substance management.  Ordered Lab-work, Procedure(s), Referral(s), & Consult(s): Orders Placed This Encounter  Procedures  . Lumbar Epidural Injection   Pharmacological management options:  Opioid Analgesics: The patient was informed that there is no guarantee that he would be a candidate for opioid analgesics. The decision will be made following CDC guidelines. This decision will be based on the results of diagnostic studies, as well as Mr. Ceesay risk profile.   Membrane stabilizer: To be determined at a later time  Muscle relaxant: To be determined at a later time  NSAID: To be determined at a later time  Other analgesic(s): To be determined at a later time   Interventional management options: Mr. Hink was  informed that there is no guarantee that he would be a candidate for interventional therapies. The decision will be based on the results of diagnostic studies, as well as Mr. Kagel risk profile.  Procedure(s) under consideration:  R L4/5 ESI Lumbar facets Thoracic facets   Provider-requested follow-up: Return if symptoms worsen or fail to improve.  Future Appointments  Date Time Provider [place]  07/08/2017 11:00 AM Chrismon, Vickki Muff, [place] [facility] None  07/09/2017  3:15 PM Blythe Stanford, PT [facility] None  07/11/2017  3:45 PM Rissell, Lake Bells, PT [facility] None  07/15/2017  3:15 PM Rissell, Lake Bells, PT [facility] None  07/17/2017  3:15 PM Rissell, Lake Bells, PT [facility] None    Primary Care Physician: Margo Common, PA Location: [facility] Note by: Gillis Santa, M.D, Date: 07/04/2017; Time: 1:07 PM  There are no Patient Instructions on file for this visit.

## 2017-07-04 NOTE — Progress Notes (Signed)
Safety precautions to be maintained throughout the outpatient stay will include: orient to surroundings, keep bed in low position, maintain call bell within reach at all times, provide assistance with transfer out of bed and ambulation.  

## 2017-07-04 NOTE — Therapy (Signed)
[place] [facility] [address] [place], [address] Phone: [phone]   Fax:  [phone]  Physical Therapy Evaluation  Patient Details  Name: Luke Rojas MRN: [id number] Date of Birth: 07-May-1945 Referring Provider: Vernie Murders   Encounter Date: 07/03/2017  PT End of Session - 07/04/17 1541    Visit Number  1    Number of Visits  13    Date for PT Re-Evaluation  08/14/17    Authorization Type  1 / 10 Progress Note    PT Start Time  1600    PT Stop Time  1700    PT Time Calculation (min)  60 min    Activity Tolerance  Patient tolerated treatment well    Behavior During Therapy  [facility] for tasks assessed/performed       Past Medical History:  Diagnosis Date  . Chronic kidney disease    kidney stones  . Coronary artery disease    2 stents   . GERD (gastroesophageal reflux disease)   . Hypercholesteremia   . Hypertension   . Myocardial infarction [facility]) 2013    Past Surgical History:  Procedure Laterality Date  . CARDIAC CATHETERIZATION  2013   2 stents  . COLONOSCOPY  04/27/05  . COLONOSCOPY WITH PROPOFOL N/A 06/08/2015   Procedure: COLONOSCOPY WITH PROPOFOL;  Surgeon: Robert Bellow, MD;  Location[facility] ENDOSCOPY;  Service: Endoscopy;  Laterality: N/A;  . CYSTOSCOPY WITH STENT PLACEMENT Right 09/16/2016   Procedure: CYSTOSCOPY WITH STENT PLACEMENT;  Surgeon: Royston Cowper, MD;  Location: [facility] ORS;  Service: Urology;  Laterality: Right;  . EXTRACORPOREAL SHOCK WAVE LITHOTRIPSY Left 11/04/2014   has had 2 previous lithotripsies  . EXTRACORPOREAL SHOCK WAVE LITHOTRIPSY Left 03/24/2015   Procedure: EXTRACORPOREAL SHOCK WAVE LITHOTRIPSY (ESWL);  Surgeon: Royston Cowper, MD;  Location: [facility] ORS;  Service: Urology;  Laterality: Left;  . EXTRACORPOREAL SHOCK WAVE LITHOTRIPSY Right 10/04/2016   Procedure: EXTRACORPOREAL SHOCK WAVE LITHOTRIPSY (ESWL);  Surgeon: Royston Cowper, MD;  Location: [facility] ORS;   Service: Urology;  Laterality: Right;    There were no vitals filed for this visit.   Subjective Assessment - 07/04/17 1433    Subjective  Patient reports he has had 6 weeks of low back pain with radiaiting pain down his R LE. Patient reports his pain has improved greatly since the previous Friday. Patient states he currently no longer having any pain in the back or radiaiting into the leg. Before the previous Friday, patient reports increased pain with "all movements". Patient reports decreased pain with heat modalities. Patient states the pain states he had radiaiting pain down into the affected LE. Patient reports he is worried about his pain worsening again and hopes to avoid surgery where he can.     Pertinent History  Chronic low back pain    Patient Stated Goals  To Keep the pain away    Currently in Pain?  No/denies    Pain Score  0-No pain worse pain: 10/10    Pain Location  Back    Pain Orientation  Lower    Pain Descriptors / Indicators  Burning;Shooting    Pain Onset  1 to 4 weeks ago    Pain Frequency  Constant         [facility] PT Assessment - 07/04/17 0001      Assessment   Medical Diagnosis  LBP with radiculopathy    Referring Provider  Simona Huh Chrismon  Onset Date/Surgical Date  05/23/17    Hand Dominance  Right    Next MD Visit  unknown    Prior Therapy  yes for LBP      Balance Screen   Has the patient fallen in the past 6 months  No    Has the patient had a decrease in activity level because of a fear of falling?   No    Is the patient reluctant to leave their home because of a fear of falling?   No      Prior Function   Level of Independence  Independent    Vocation  Retired    Biomedical scientist  N/A    Leisure  Yard work      Observation/Other Assessments   Observations  Increased lumbar flexion      Arts administrator Tests   Functional tests  Squat      Squat   Comments  decreased lumbar extension       Posture/Postural Control   Posture Comments  FHP, increased thoracic kyphosis      ROM / Strength   AROM / PROM / Strength  AROM;Strength      AROM   AROM Assessment Site  Lumbar;Hip    Right/Left Hip  Right;Left    Right Hip Extension  5    Right Hip Flexion  110    Right Hip External Rotation   40    Right Hip Internal Rotation   20    Right Hip ABduction  30    Right Hip ADduction  20    Left Hip Extension  0    Left Hip Flexion  110    Left Hip External Rotation   40    Left Hip Internal Rotation   20    Left Hip ABduction  30    Left Hip ADduction  20    Lumbar Flexion  WNL    Lumbar Extension  25% limited - no pain    Lumbar - Right Side Bend  WNL    Lumbar - Left Side Bend  WNL    Lumbar - Right Rotation  WNL    Lumbar - Left Rotation  WNL      Strength   Strength Assessment Site  Hip;Knee;Ankle;Lumbar    Right/Left Hip  Right;Left    Right Hip Flexion  5/5    Right Hip Extension  4/5    Right Hip External Rotation   4/5    Right Hip Internal Rotation  4+/5    Right Hip ABduction  4-/5    Right Hip ADduction  5/5    Left Hip Flexion  5/5    Left Hip Extension  4/5    Left Hip External Rotation  4-/5    Left Hip Internal Rotation  4+/5    Left Hip ABduction  4-/5    Left Hip ADduction  5/5    Right/Left Knee  Right;Left    Right Knee Flexion  5/5    Right Knee Extension  5/5    Left Knee Flexion  5/5    Left Knee Extension  5/5    Right/Left Ankle  Right;Left    Right Ankle Dorsiflexion  5/5    Right Ankle Plantar Flexion  4+/5    Left Ankle Dorsiflexion  5/5    Left Ankle Plantar Flexion  4+/5    Lumbar Flexion  5/5  Lumbar Extension  5/5      Palpation   Palpation comment  No Increase in pain with palpation      Special Tests    Special Tests  Lumbar    Lumbar Tests  Slump Test      Slump test   Findings  Negative    Side  -- B    Comment  No increase in pain       Balance   Balance Assessed  Yes      Static Standing Balance   Static  Standing - Balance Support  No upper extremity supported    Static Standing - Comment/# of Minutes  Single leg Stance -- 5 sec B; tandem stance: 10sec B; feet together EC: 10sec; feet together balance -- 10sec        Objective measurements completed on examination: See above findings.    TREATMENT Therapeutic Exercise: Single Leg Stance -- 2 x 30sec Tandem stance -- x 30sec B Airex balance EC feet together balance -- 2 x 30  Patient demonstrates decreased postural sway with focus on eye control     PT Education - 07/04/17 1539    Education provided  Yes    Education Details  HEP: single leg stance; tandem stance    Person(s) Educated  Patient    Methods  Explanation;Demonstration;Handout    Comprehension  Verbalized understanding;Returned demonstration          PT Long Term Goals - 07/04/17 1801      PT LONG TERM GOAL #1   Title  Patient will be independent with exercise performance to continue benefits of therapy after discharge.    Baseline  Dependent with HEP    Time  4    Period  Weeks    Status  New    Target Date  07/31/17      PT LONG TERM GOAL #2   Title  Patient will have 0/10 pain for a 2 week period to indicate significant improvement to better be able to perform functional activities.    Baseline  0/10 pain for 4 days    Time  6    Period  Weeks    Status  New    Target Date  08/14/17      PT LONG TERM GOAL #3   Title  Patient will be able to hold a single leg stance for >10sec B to better be able to perform functional outdoor activities    Baseline  5 sec B    Time  6    Period  Weeks    Status  New    Target Date  08/14/17             Plan - 07/04/17 1544    Clinical Impression Statement  Patient is a 72 yo right hand dominant male presenting with intermittent increase in pain along the R side of his lumbar spine. Patient demonstrates increased lumbar dysfunction with radiating pain indicating sciatic involvement. Patient also  demosntrates decreased ability to balance most notably with decreased base of support. Patient also demonstrates increased difficulty with eyes closed balance most notably with on compliant surfaces. Patient will benefit from further skilled therapy to return to prior level of function.     History and Personal Factors relevant to plan of care:  Previous Chronic LBP    Clinical Presentation  Stable    Clinical Presentation due to:  Patient not currently in pain    Clinical Decision Making  Low    Rehab Potential  Good    Clinical Impairments Affecting Rehab Potential  (+) not currently in pain (-) Chronic LBP    PT Frequency  2x / week    PT Duration  6 weeks    PT Treatment/Interventions  Neuromuscular re-education;Electrical Stimulation;Cryotherapy;Moist Heat;Dry needling;Balance training;Gait training;Patient/family education;Manual techniques;Ultrasound;Aquatic Therapy;Passive range of motion    PT Next Visit Plan  Address balance difficulties    PT Home Exercise Plan  See education section    Consulted and Agree with Plan of Care  Patient       Patient will benefit from skilled therapeutic intervention in order to improve the following deficits and impairments:  Decreased coordination, Decreased strength, Decreased balance, Pain  Visit Diagnosis: Radiculopathy, lumbar region  Difficulty in walking, not elsewhere classified     Problem List Patient Active Problem List   Diagnosis Date Noted  . Lumbar radiculopathy (R-sided) L4 07/04/2017  . 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 Lumbar facet arthropathy 07/04/2017  . 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 Foraminal stenosis of thoracic region 07/04/2017  . Chronic pain syndrome 07/04/2017  . Encounter for screening colonoscopy 05/04/2015  . Arthritis 10/08/2014  . H/O acute myocardial infarction 10/08/2014  . Acid reflux 10/08/2014  . HLD (hyperlipidemia) 10/08/2014  . BP (high blood  pressure) 10/08/2014  . 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 Benign neoplasm of skin 07/19/2006  . Essential (primary) hypertension 11/15/2005  . Decreased potassium in the blood 11/15/2005    Blythe Stanford, PT DPT 07/04/2017, 6:16 PM  [place] [facility] [address], [place], [address] Phone: [phone]   Fax:  [phone]  Name: Luke Rojas MRN: [id number] Date of Birth: 12-Dec-1945

## 2017-07-08 ENCOUNTER — Encounter: Payer: Self-pay | Admitting: Family Medicine

## 2017-07-08 ENCOUNTER — Ambulatory Visit (INDEPENDENT_AMBULATORY_CARE_PROVIDER_SITE_OTHER): Payer: Medicare Other | Admitting: Family Medicine

## 2017-07-08 VITALS — BP 112/64 | HR 58 | Temp 98.2°F | Wt 190.0 lb

## 2017-07-08 DIAGNOSIS — E119 Type 2 diabetes mellitus without complications: Secondary | ICD-10-CM

## 2017-07-08 DIAGNOSIS — M1990 Unspecified osteoarthritis, unspecified site: Secondary | ICD-10-CM | POA: Diagnosis not present

## 2017-07-08 MED ORDER — DICLOFENAC SODIUM 1 % TD GEL: 2.0000 g | Freq: Four times a day (QID) | TRANSDERMAL | 3 refills | Status: AC

## 2017-07-08 MED ORDER — GLUCOSE BLOOD VI STRP: ORAL_STRIP | 4 refills | Status: DC

## 2017-07-08 MED ORDER — ONETOUCH ULTRASOFT LANCETS MISC: 4 refills | Status: DC

## 2017-07-08 NOTE — Progress Notes (Signed)
Patient: Luke Rojas Male    DOB: [birth year]   72 y.o.   MRN: [id number] Visit Date: 07/08/2017  Today's Provider: Vernie Murders, PA   Chief Complaint  Patient presents with  . Diabetes  . Follow-up   Subjective:    HPI  Diabetes Mellitus Type II-Initial Treatment Visit  Lab Results  Component Value Date   HGBA1C 6.7 (H) 06/27/2017    Last seen 2 weeks ago.  Management since then includes advised to follow up in 2 weeks to discuss treatment plan for newly diagnosed diabetes and referral to lifestyle center. He reports good compliance with treatment. He is not having side effects.  Current symptoms include none Home blood sugar records: not being checked yet  Episodes of hypoglycemia? Unknown   Current Insulin Regimen: none Most Recent Eye Exam: not UTD Weight trend: stable Prior visit with dietician: no Current diet: in general, a "healthy" diet   Current exercise: no regular exercise  Pertinent Labs:    Component Value Date/Time   CHOL 101 06/27/2017 0825   TRIG 160 (H) 06/27/2017 0825   HDL 28 (L) 06/27/2017 0825   LDLCALC 41 06/27/2017 0825   CREATININE 1.24 06/27/2017 0825   CREATININE 1.88 (H) 10/02/2011 1051    Wt Readings from Last 3 Encounters:  07/08/17 190 lb (86.2 kg)  07/04/17 185 lb (83.9 kg)  06/25/17 191 lb (86.6 kg)    ------------------------------------------------------------------------ Past Medical History:  Diagnosis Date  . Chronic kidney disease    kidney stones  . Coronary artery disease    2 stents   . GERD (gastroesophageal reflux disease)   . Hypercholesteremia   . Hypertension   . Myocardial infarction [facility]) 2013   Past Surgical History:  Procedure Laterality Date  . CARDIAC CATHETERIZATION  2013   2 stents  . COLONOSCOPY  04/27/05  . COLONOSCOPY WITH PROPOFOL N/A 06/08/2015   Procedure: COLONOSCOPY WITH PROPOFOL;  Surgeon: Robert Bellow, MD;  Location: [facility] ENDOSCOPY;  Service: Endoscopy;  Laterality:  N/A;  . CYSTOSCOPY WITH STENT PLACEMENT Right 09/16/2016   Procedure: CYSTOSCOPY WITH STENT PLACEMENT;  Surgeon: Royston Cowper, MD;  Location: [facility] ORS;  Service: Urology;  Laterality: Right;  . EXTRACORPOREAL SHOCK WAVE LITHOTRIPSY Left 11/04/2014   has had 2 previous lithotripsies  . EXTRACORPOREAL SHOCK WAVE LITHOTRIPSY Left 03/24/2015   Procedure: EXTRACORPOREAL SHOCK WAVE LITHOTRIPSY (ESWL);  Surgeon: Royston Cowper, MD;  Location: [facility] ORS;  Service: Urology;  Laterality: Left;  . EXTRACORPOREAL SHOCK WAVE LITHOTRIPSY Right 10/04/2016   Procedure: EXTRACORPOREAL SHOCK WAVE LITHOTRIPSY (ESWL);  Surgeon: Royston Cowper, MD;  Location: [facility] ORS;  Service: Urology;  Laterality: Right;   Family History  Problem Relation Age of Onset  . Pulmonary embolism Mother   . Transient ischemic attack Mother   . Pancreatic cancer Father   . Hypertension Father   . Cirrhosis Brother    Allergies  Allergen Reactions  . Codeine Nausea Only    Current Outpatient Medications:  .  atorvastatin (LIPITOR) 40 MG tablet, Take 1 tablet by mouth daily., Disp: , Rfl:  .  co-enzyme Q-10 30 MG capsule, Take 30 mg by mouth 3 (three) times daily., Disp: , Rfl:  .  docusate sodium (COLACE) 100 MG capsule, Take 2 capsules (200 mg total) by mouth 2 (two) times daily., Disp: 120 capsule, Rfl: 3 .  finasteride (PROSCAR) 5 MG tablet, Take 5 mg by mouth every evening., Disp: , Rfl:  .  meloxicam (MOBIC)  15 MG tablet, TAKE 1 TABLET BY MOUTH ONCE A DAY, Disp: 30 tablet, Rfl: 3 .  methocarbamol (ROBAXIN) 750 MG tablet, Take 1 tablet (750 mg total) by mouth 4 (four) times daily., Disp: 40 tablet, Rfl: 1 .  metoprolol (LOPRESSOR) 50 MG tablet, Take 50 mg by mouth 2 (two) times daily. Morning and night, Disp: , Rfl:  .  MULTIPLE VITAMIN PO, Take 1 tablet by mouth daily., Disp: , Rfl:  .  nitroGLYCERIN (NITROSTAT) 0.4 MG SL tablet, Place 0.4 mg under the tongue every 5 (five) minutes as needed. Reported on 03/24/2015, Disp: ,  Rfl:  .  Omega-3 Fatty Acids (FISH OIL) 1200 MG CAPS, Take 1 capsule by mouth 2 (two) times daily., Disp: , Rfl:  .  omeprazole (PRILOSEC) 20 MG capsule, Take 20 mg by mouth daily. , Disp: , Rfl:  .  ondansetron (ZOFRAN ODT) 8 MG disintegrating tablet, Take 0.5 tablets (4 mg total) by mouth every 6 (six) hours as needed for nausea or vomiting., Disp: 4 tablet, Rfl: 3 .  ramipril (ALTACE) 10 MG capsule, Take 1 capsule by mouth daily., Disp: , Rfl:  .  tamsulosin (FLOMAX) 0.4 MG CAPS capsule, Take 1 capsule (0.4 mg total) by mouth daily after breakfast., Disp: 14 capsule, Rfl: 0  Review of Systems  Constitutional: Negative.   Respiratory: Negative.   Cardiovascular: Negative.   Endocrine: Negative.  Negative for cold intolerance, heat intolerance, polydipsia, polyphagia and polyuria.   Social History   Tobacco Use  . Smoking status: Former Smoker    Packs/day: 1.00    Years: 30.00    Pack years: 30.00    Types: Cigarettes    Last attempt to quit: 03/05/1984    Years since quitting: 33.3  . Smokeless tobacco: Current User    Types: Chew  Substance Use Topics  . Alcohol use: No    Alcohol/week: 0.0 oz   Objective:   BP 112/64 (BP Location: Right Arm, Patient Position: Sitting, Cuff Size: Normal)   Pulse (!) 58   Temp 98.2 F (36.8 C) (Oral)   Wt 190 lb (86.2 kg)   SpO2 98%   BMI 28.89 kg/m   Physical Exam  Constitutional: He is oriented to person, place, and time. He appears well-developed and well-nourished. No distress.  HENT:  Head: Normocephalic and atraumatic.  Right Ear: Hearing normal.  Left Ear: Hearing normal.  Nose: Nose normal.  Eyes: Conjunctivae and lids are normal. Right eye exhibits no discharge. Left eye exhibits no discharge. No scleral icterus.  Cardiovascular: Normal rate and regular rhythm.  Pulmonary/Chest: Effort normal. No respiratory distress.  Musculoskeletal:  Some stiffness and swelling of 2nd and 3rd MCP joints both hands.  Neurological: He is  alert and oriented to person, place, and time.  Skin: Skin is intact. No lesion and no rash noted.  Psychiatric: He has a normal mood and affect. His speech is normal and behavior is normal. Thought content normal.   Diabetic Foot Form - Detailed   Diabetic Foot Exam - detailed Diabetic Foot exam was performed with the following findings:  Yes 07/08/2017 12:20 PM  Visual Foot Exam completed.:  Yes  Can the patient see the bottom of their feet?:  Yes Are the shoes appropriate in style and fit?:  Yes Is there swelling or and abnormal foot shape?:  No Is there a claw toe deformity?:  No Is there elevated skin temparature?:  No Is there foot or ankle muscle weakness?:  No Normal Range  of Motion:  Yes Pulse Foot Exam completed.:  Yes  Right posterior Tibialias:  Present Left posterior Tibialias:  Present  Right Dorsalis Pedis:  Present Left Dorsalis Pedis:  Present  Sensory Foot Exam Completed.:  Yes Semmes-Weinstein Monofilament Test R Site 1-Great Toe:  Pos L Site 1-Great Toe:  Pos          Assessment & Plan:     1. Diabetes mellitus without complication (HCC) Hgb H5F was 6.7% on 06-27-17. Taught to use a glucometer and BS 104 this morning. Has been decreasing this sweets and with back doing better, getting more exercise at home. Will schedule diabetic education for new onset diabetes. Should check FBS daily and recheck in 3 months. Had normal foot exam today and was seen by his ophthalmologist earlier this year. Advised to get eye exam annually. - Ambulatory referral to diabetic education - glucose blood test strip; Test fasting blood glucose each morning before breakfast.  Dispense: 100 each; Refill: 4 - Lancets (ONETOUCH ULTRASOFT) lancets; Test fasting glucose level each morning before breakfast.  Dispense: 100 each; Refill: 4  2. Arthritis Intermittent flare of pain and stiffness of both hands. Recent back pain with DDD, lumbar facet arthropathy and L3 foraminal stenosis much  improved with use of Robaxin. Presently going to physical therapy and had physiatry evaluation (Dr. Sharlet Salina) 07-04-17. Back pain has totally abated today and able to work in his yard without discomfort. States Dr. Sharlet Salina recommended he proceed with conservative treatment and would be available for follow up if needed. Will refill Voltaren Gel for his hands. - diclofenac sodium (VOLTAREN) 1 % GEL; Apply 2 g topically 4 (four) times daily.  Dispense: 100 g; Refill: [place], PA  [facility]

## 2017-07-09 ENCOUNTER — Ambulatory Visit: Payer: Medicare Other

## 2017-07-09 DIAGNOSIS — M545 Low back pain: Secondary | ICD-10-CM

## 2017-07-09 DIAGNOSIS — M5416 Radiculopathy, lumbar region: Secondary | ICD-10-CM | POA: Diagnosis not present

## 2017-07-09 DIAGNOSIS — R262 Difficulty in walking, not elsewhere classified: Secondary | ICD-10-CM

## 2017-07-09 NOTE — Therapy (Signed)
[place] [address] [place], [address] Phone: [phone]   Fax:  [phone]  Physical Therapy Treatment  Patient Details  Name: Luke Rojas MRN: [id number] Date of Birth: August 19, 1945 Referring Provider: Vernie Murders   Encounter Date: 07/09/2017  PT End of Session - 07/09/17 1530    Visit Number  2    Number of Visits  13    Date for PT Re-Evaluation  08/14/17    Authorization Type  2 / 10 Progress Note    PT Start Time  1515    PT Stop Time  1600    PT Time Calculation (min)  45 min    Activity Tolerance  Patient tolerated treatment well    Behavior During Therapy  [facility] for tasks assessed/performed       Past Medical History:  Diagnosis Date  . Chronic kidney disease    kidney stones  . Coronary artery disease    2 stents   . GERD (gastroesophageal reflux disease)   . Hypercholesteremia   . Hypertension   . Myocardial infarction [facility]) 2013    Past Surgical History:  Procedure Laterality Date  . CARDIAC CATHETERIZATION  2013   2 stents  . COLONOSCOPY  04/27/05  . COLONOSCOPY WITH PROPOFOL N/A 06/08/2015   Procedure: COLONOSCOPY WITH PROPOFOL;  Surgeon: Robert Bellow, MD;  Location: [facility] ENDOSCOPY;  Service: Endoscopy;  Laterality: N/A;  . CYSTOSCOPY WITH STENT PLACEMENT Right 09/16/2016   Procedure: CYSTOSCOPY WITH STENT PLACEMENT;  Surgeon: Royston Cowper, MD;  Location: [facility] ORS;  Service: Urology;  Laterality: Right;  . EXTRACORPOREAL SHOCK WAVE LITHOTRIPSY Left 11/04/2014   has had 2 previous lithotripsies  . EXTRACORPOREAL SHOCK WAVE LITHOTRIPSY Left 03/24/2015   Procedure: EXTRACORPOREAL SHOCK WAVE LITHOTRIPSY (ESWL);  Surgeon: Royston Cowper, MD;  Location: [facility] ORS;  Service: Urology;  Laterality: Left;  . EXTRACORPOREAL SHOCK WAVE LITHOTRIPSY Right 10/04/2016   Procedure: EXTRACORPOREAL SHOCK WAVE LITHOTRIPSY (ESWL);  Surgeon: Royston Cowper, MD;  Location: [facility] ORS;   Service: Urology;  Laterality: Right;    There were no vitals filed for this visit.  Subjective Assessment - 07/09/17 1523    Subjective  Patient reports he has not had any pain in his back since two Fridays from now. Patient reports he continues to have difficulty from balancing.     Pertinent History  Chronic low back pain    Patient Stated Goals  To Keep the pain away    Currently in Pain?  No/denies    Pain Onset  1 to 4 weeks ago       TREATMENT: Therapeutic Exercise: Side stepping on balance stones -- x 20  Single leg stance - 45 sec B  Tandem Stance on airex pad - x 20 Single leg stance with foot elevated on balance stone - x 20 B  Hip abduction at hip machine -  x 20 B 40# Rotational ball toss at rebounder - x20 B with foot elevated on balance stone  Patient demonstrates increased fatigue at end of the session     PT Education - 07/09/17 1529    Education provided  Yes    Education Details  form/technique with exercise    Person(s) Educated  Patient    Methods  Explanation;Demonstration    Comprehension  Verbalized understanding;Returned demonstration          PT Long Term Goals - 07/04/17 1801      PT  LONG TERM GOAL #1   Title  Patient will be independent with exercise performance to continue benefits of therapy after discharge.    Baseline  Dependent with HEP    Time  4    Period  Weeks    Status  New    Target Date  07/31/17      PT LONG TERM GOAL #2   Title  Patient will have 0/10 pain for a 2 week period to indicate significant improvement to better be able to perform functional activities.    Baseline  0/10 pain for 4 days    Time  6    Period  Weeks    Status  New    Target Date  08/14/17      PT LONG TERM GOAL #3   Title  Patient will be able to hold a single leg stance for >10sec B to better be able to perform functional outdoor activities    Baseline  5 sec B    Time  6    Period  Weeks    Status  New    Target Date  08/14/17             Plan - 07/09/17 1543    Clinical Impression Statement  Patient demonstrates improvement in single leg stance today with ability to balance for ~ 7 sec without requiring UE support. Although patient is improving, he continues to require frequent cueing for technique throughout session indicating decreased coordination and decreased strength. Patient will benefit from further skilled therapy to return to prior leveel of function.     Rehab Potential  Good    Clinical Impairments Affecting Rehab Potential  (+) not currently in pain (-) Chronic LBP    PT Frequency  2x / week    PT Duration  6 weeks    PT Treatment/Interventions  Neuromuscular re-education;Electrical Stimulation;Cryotherapy;Moist Heat;Dry needling;Balance training;Gait training;Patient/family education;Manual techniques;Ultrasound;Aquatic Therapy;Passive range of motion    PT Next Visit Plan  Address balance difficulties    PT Home Exercise Plan  See education section    Consulted and Agree with Plan of Care  Patient       Patient will benefit from skilled therapeutic intervention in order to improve the following deficits and impairments:  Decreased coordination, Decreased strength, Decreased balance, Pain  Visit Diagnosis: Radiculopathy, lumbar region  Difficulty in walking, not elsewhere classified  Acute low back pain, unspecified back pain laterality, with sciatica presence unspecified     Problem List Patient Active Problem List   Diagnosis Date Noted  . Lumbar radiculopathy (R-sided) L4 07/04/2017  . 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 Lumbar facet arthropathy 07/04/2017  . 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 Foraminal stenosis of thoracic region 07/04/2017  . Chronic pain syndrome 07/04/2017  . Encounter for screening colonoscopy 05/04/2015  . Arthritis 10/08/2014  . H/O acute myocardial infarction 10/08/2014  . Acid reflux 10/08/2014  . HLD  (hyperlipidemia) 10/08/2014  . BP (high blood pressure) 10/08/2014  . 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 Benign neoplasm of skin 07/19/2006  . Essential (primary) hypertension 11/15/2005  . Decreased potassium in the blood 11/15/2005    Blythe Stanford, PT DPT 07/09/2017, 3:54 PM  [place] [facility] [address], [place], [address] Phone: [phone]   Fax:  [phone]  Name: KANAV KAZMIERCZAK MRN: [id number] Date of Birth: 08-29-1945

## 2017-07-11 ENCOUNTER — Ambulatory Visit: Payer: Medicare Other

## 2017-07-15 ENCOUNTER — Ambulatory Visit: Payer: Medicare Other

## 2017-07-17 ENCOUNTER — Other Ambulatory Visit: Payer: Self-pay | Admitting: Family Medicine

## 2017-07-17 ENCOUNTER — Ambulatory Visit: Payer: Medicare Other

## 2017-07-17 DIAGNOSIS — M5416 Radiculopathy, lumbar region: Secondary | ICD-10-CM | POA: Diagnosis not present

## 2017-07-17 DIAGNOSIS — M47819 Spondylosis without myelopathy or radiculopathy, site unspecified: Secondary | ICD-10-CM

## 2017-07-17 NOTE — Therapy (Signed)
[place] [facility] [address], [place], [address] Phone: [phone]   Fax:  [phone]  Physical Therapy Treatment/DIscharge Summary  Patient Details  Name: Luke Rojas MRN: [id number] Date of Birth: Jan 29, 1946 Referring Provider: Vernie Murders   Encounter Date: 07/17/2017  PT End of Session - 07/17/17 1616    Visit Number  3    Number of Visits  13    Date for PT Re-Evaluation  08/14/17    Authorization Type  3 / 10 Progress Note    PT Start Time  1515    PT Stop Time  1530    PT Time Calculation (min)  15 min    Activity Tolerance  Patient tolerated treatment well    Behavior During Therapy  [facility] for tasks assessed/performed       Past Medical History:  Diagnosis Date  . Chronic kidney disease    kidney stones  . Coronary artery disease    2 stents   . GERD (gastroesophageal reflux disease)   . Hypercholesteremia   . Hypertension   . Myocardial infarction [facility]) 2013    Past Surgical History:  Procedure Laterality Date  . CARDIAC CATHETERIZATION  2013   2 stents  . COLONOSCOPY  04/27/05  . COLONOSCOPY WITH PROPOFOL N/A 06/08/2015   Procedure: COLONOSCOPY WITH PROPOFOL;  Surgeon: Robert Bellow, MD;  Location: [facility] ENDOSCOPY;  Service: Endoscopy;  Laterality: N/A;  . CYSTOSCOPY WITH STENT PLACEMENT Right 09/16/2016   Procedure: CYSTOSCOPY WITH STENT PLACEMENT;  Surgeon: Royston Cowper, MD;  Location: [facility] ORS;  Service: Urology;  Laterality: Right;  . EXTRACORPOREAL SHOCK WAVE LITHOTRIPSY Left 11/04/2014   has had 2 previous lithotripsies  . EXTRACORPOREAL SHOCK WAVE LITHOTRIPSY Left 03/24/2015   Procedure: EXTRACORPOREAL SHOCK WAVE LITHOTRIPSY (ESWL);  Surgeon: Royston Cowper, MD;  Location: [facility] ORS;  Service: Urology;  Laterality: Left;  . EXTRACORPOREAL SHOCK WAVE LITHOTRIPSY Right 10/04/2016   Procedure: EXTRACORPOREAL SHOCK WAVE LITHOTRIPSY (ESWL);  Surgeon: Royston Cowper, MD;   Location: [facility] ORS;  Service: Urology;  Laterality: Right;    There were no vitals filed for this visit.  Subjective Assessment - 07/17/17 1556    Subjective  Patient reports to not have pain with any functional activities and patient reports he is able to be dischaged from physical therapy.     Pertinent History  Chronic low back pain    Patient Stated Goals  To Keep the pain away    Currently in Pain?  No/denies    Pain Onset  1 to 4 weeks ago        TREATMENT: SLS in standing -- 2 x 30sec B Step ups onto 4" step -- x 20 B Hip abduction in standing -- x 20 B Hip Extension in standing -- x 20 B  Patient demonstrates no increase in pain throughout session      PT Education - 07/17/17 1616    Education provided  Yes    Education Details  continue HEP     Person(s) Educated  Patient    Methods  Explanation;Demonstration    Comprehension  Verbalized understanding;Returned demonstration          PT Long Term Goals - 07/17/17 1632      PT LONG TERM GOAL #1   Title  Patient will be independent with exercise performance to continue benefits of therapy after discharge.    Baseline  Dependent with HEP; 07/17/2017: Independent with HEP  Time  4    Period  Weeks    Status  Achieved      PT LONG TERM GOAL #2   Title  Patient will have 0/10 pain for a 2 week period to indicate significant improvement to better be able to perform functional activities.    Baseline  0/10 pain for 4 days;  07/17/2017: 0/10 pain for >3 weeks    Time  6    Period  Weeks    Status  Achieved      PT LONG TERM GOAL #3   Title  Patient will be able to hold a single leg stance for >10sec B to better be able to perform functional outdoor activities    Baseline  5 sec B; 07/17/2017: >15 sec B    Time  6    Period  Weeks    Status  Achieved            Plan - 07/17/17 1630    Clinical Impression Statement  Patient continues to not have any pain with exercises and functional activities.  Patient demosntrates sginfiicant improvement in balance with >10sec balance with SLS. Patient has met all long term goals and is be discharged from physical therapy.     Rehab Potential  Good    Clinical Impairments Affecting Rehab Potential  (+) not currently in pain (-) Chronic LBP    PT Frequency  2x / week    PT Duration  6 weeks    PT Treatment/Interventions  Neuromuscular re-education;Electrical Stimulation;Cryotherapy;Moist Heat;Dry needling;Balance training;Gait training;Patient/family education;Manual techniques;Ultrasound;Aquatic Therapy;Passive range of motion    PT Next Visit Plan  Address balance difficulties    PT Home Exercise Plan  See education section    Consulted and Agree with Plan of Care  Patient       Patient will benefit from skilled therapeutic intervention in order to improve the following deficits and impairments:  Decreased coordination, Decreased strength, Decreased balance, Pain  Visit Diagnosis: Radiculopathy, lumbar region     Problem List Patient Active Problem List   Diagnosis Date Noted  . Lumbar radiculopathy (R-sided) L4 07/04/2017  . 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 Lumbar facet arthropathy 07/04/2017  . 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 Foraminal stenosis of thoracic region 07/04/2017  . Chronic pain syndrome 07/04/2017  . Encounter for screening colonoscopy 05/04/2015  . Arthritis 10/08/2014  . H/O acute myocardial infarction 10/08/2014  . Acid reflux 10/08/2014  . HLD (hyperlipidemia) 10/08/2014  . BP (high blood pressure) 10/08/2014  . 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 Benign neoplasm of skin 07/19/2006  . Essential (primary) hypertension 11/15/2005  . Decreased potassium in the blood 11/15/2005    Blythe Stanford, PT DPT 07/17/2017, 4:34 PM  [place] [facility] [address] [place],  [address] Phone: [phone]   Fax:  [phone]  Name: Luke Rojas MRN: [id number] Date of Birth: 1945/07/25

## 2017-07-24 ENCOUNTER — Encounter: Payer: Self-pay | Admitting: *Deleted

## 2017-07-24 ENCOUNTER — Encounter: Payer: Medicare Other | Attending: Family Medicine | Admitting: *Deleted

## 2017-07-24 VITALS — BP 128/70 | Ht 68.0 in | Wt 186.9 lb

## 2017-07-24 DIAGNOSIS — E119 Type 2 diabetes mellitus without complications: Secondary | ICD-10-CM | POA: Diagnosis not present

## 2017-07-24 DIAGNOSIS — Z713 Dietary counseling and surveillance: Secondary | ICD-10-CM | POA: Diagnosis not present

## 2017-07-24 NOTE — Patient Instructions (Signed)
Check blood sugars 1 x day before breakfast or 2 hrs after one meal every day Bring blood sugar records to the next class   Exercise: Begin walking  for  10-15  minutes  3 days a week and gradually increase to 30 minutes 5 x week  Eat 3 meals day,  1  snack a day Space meals 4-6 hours apart Avoid sugar sweetened drinks (juices)  Return for classes on:

## 2017-07-24 NOTE — Progress Notes (Signed)
Diabetes Self-Management Education  Visit Type: First/Initial  Appt. Start Time: 1000 Appt. End Time: 0254  07/24/2017  [name], identified by name and date of birth, is a 72 y.o. male with a diagnosis of Diabetes: Type 2.   ASSESSMENT  Blood pressure 128/70, height 5\' 8"  (1.727 m), weight 186 lb 14.4 oz (84.8 kg). Body mass index is 28.42 kg/m.  Diabetes Self-Management Education - 07/24/17 1152      Visit Information   Visit Type  First/Initial      Initial Visit   Diabetes Type  Type 2    Are you currently following a meal plan?  Yes    What type of meal plan do you follow?  stopped eating sweets    Are you taking your medications as prescribed?  Yes    Date Diagnosed  April 2019      Health Coping   How would you rate your overall health?  Good      Psychosocial Assessment   Patient Belief/Attitude about Diabetes  Motivated to manage diabetes    Self-care barriers  None    Self-management support  Doctor's office;Family;Friends    Patient Concerns  Nutrition/Meal planning;Glycemic Control    Special Needs  None    Preferred Learning Style  Auditory    Learning Readiness  Change in progress    How often do you need to have someone help you when you read instructions, pamphlets, or other written materials from your doctor or pharmacy?  1 - Never    What is the last grade level you completed in school?  10th      Pre-Education Assessment   Patient understands the diabetes disease and treatment process.  Needs Instruction    Patient understands incorporating nutritional management into lifestyle.  Needs Instruction    Patient undertands incorporating physical activity into lifestyle.  Needs Instruction    Patient understands using medications safely.  Needs Instruction    Patient understands monitoring blood glucose, interpreting and using results  Needs Review    Patient understands prevention, detection, and treatment of acute complications.  Needs  Instruction    Patient understands prevention, detection, and treatment of chronic complications.  Needs Instruction    Patient understands how to develop strategies to address psychosocial issues.  Needs Instruction    Patient understands how to develop strategies to promote health/change behavior.  Needs Instruction      Complications   Last HgB A1C per patient/outside source  6.7 % 06/27/17    How often do you check your blood sugar?  1-2 times/day    Fasting Blood glucose range (mg/dL)  70-129 FBG's 90-128 mg/dL    Have you had a dilated eye exam in the past 12 months?  Yes    Have you had a dental exam in the past 12 months?  No dentures    Are you checking your feet?  No      Dietary Intake   Breakfast  grits and [place] bacon; eggs and [place] bacon; cereal, milk and blueberries    Lunch  hamburger, salads    Dinner  beef, pork, chicken, fish, pizza, some potatoes and bread; peas, corn, pinto beans, salads, onions, pickles    Beverage(s)  water, unsweetened tea, coffee, juices      Exercise   Exercise Type  ADL's      Patient Education   Previous Diabetes Education  No    Disease state   Definition of diabetes, type 1  and 2, and the diagnosis of diabetes;Factors that contribute to the development of diabetes    Nutrition management   Role of diet in the treatment of diabetes and the relationship between the three main macronutrients and blood glucose level;Reviewed blood glucose goals for pre and post meals and how to evaluate the patients' food intake on their blood glucose level.    Physical activity and exercise   Role of exercise on diabetes management, blood pressure control and cardiac health.    Monitoring  Purpose and frequency of SMBG.;Taught/discussed recording of test results and interpretation of SMBG.;Identified appropriate SMBG and/or A1C goals.    Chronic complications  Relationship between chronic complications and blood glucose control;Retinopathy and reason for  yearly dilated eye exams    Psychosocial adjustment  Identified and addressed patients feelings and concerns about diabetes      Individualized Goals (developed by patient)   Reducing Risk  Improve blood sugars     Outcomes   Expected Outcomes  Demonstrated interest in learning. Expect positive outcomes       Individualized Plan for Diabetes Self-Management Training:   Learning Objective:  Patient will have a greater understanding of diabetes self-management. Patient education plan is to attend individual and/or group sessions per assessed needs and concerns.   Plan:   Patient Instructions  Check blood sugars 1 x day before breakfast or 2 hrs after one meal every day Bring blood sugar records to the next class  Exercise: Begin walking  for  10-15  minutes  3 days a week and gradually increase to 30 minutes 5 x week Eat 3 meals day,  1  snack a day Space meals 4-6 hours apart Avoid sugar sweetened drinks (juices)   Expected Outcomes:  Demonstrated interest in learning. Expect positive outcomes  Education material provided:  General Meal Planning Guidelines Simple Meal Plan  If problems or questions, patient to contact team via:  Johny Drilling, [place], [place], CDE [phone]  Future DSME appointment:  Tomorrow Jul 25, 2017 for Diabetes Class 1

## 2017-07-25 ENCOUNTER — Encounter: Payer: Self-pay | Admitting: Dietician

## 2017-07-25 ENCOUNTER — Encounter: Payer: Medicare Other | Admitting: Dietician

## 2017-07-25 VITALS — Ht 68.0 in | Wt 188.5 lb

## 2017-07-25 DIAGNOSIS — E119 Type 2 diabetes mellitus without complications: Secondary | ICD-10-CM

## 2017-07-25 DIAGNOSIS — Z713 Dietary counseling and surveillance: Secondary | ICD-10-CM | POA: Diagnosis not present

## 2017-07-25 NOTE — Progress Notes (Signed)

## 2017-07-26 ENCOUNTER — Telehealth: Payer: Self-pay | Admitting: *Deleted

## 2017-07-26 ENCOUNTER — Ambulatory Visit: Payer: Medicare Other | Admitting: Family Medicine

## 2017-07-26 NOTE — Telephone Encounter (Signed)
Received voice mail from patient last night that his blood sugar was 72 after supper. He questioned what to do. Called patient and he reports that he drank some juice and his blood sugar went to 95. He couldn't remember what he had for supper. He only had a salad with chicken for lunch yesterday. We discussed hypoglycemia symptoms and balanced meals with protein serving and up to 3 servings of carbohydrates with each meal. He does report that he went to his notebook from class yesterday and saw the list for carbohydrates. He had egg whites, [place] bacon, toast and small amount of grits for breakfast. He is not taking any diabetes medications. Instructed him to call back if he has any further questions.

## 2017-08-01 ENCOUNTER — Encounter: Payer: Self-pay | Admitting: *Deleted

## 2017-08-01 ENCOUNTER — Encounter: Payer: Medicare Other | Admitting: *Deleted

## 2017-08-01 VITALS — Wt 185.4 lb

## 2017-08-01 DIAGNOSIS — E119 Type 2 diabetes mellitus without complications: Secondary | ICD-10-CM

## 2017-08-01 DIAGNOSIS — Z713 Dietary counseling and surveillance: Secondary | ICD-10-CM | POA: Diagnosis not present

## 2017-08-01 NOTE — Progress Notes (Signed)

## 2017-08-04 ENCOUNTER — Other Ambulatory Visit: Payer: Self-pay

## 2017-08-04 ENCOUNTER — Emergency Department
Admission: EM | Admit: 2017-08-04 | Discharge: 2017-08-04 | Disposition: A | Discharge status: Home / Self Care | Payer: Medicare Other | Attending: Emergency Medicine | Admitting: Emergency Medicine

## 2017-08-04 DIAGNOSIS — I10 Essential (primary) hypertension: Secondary | ICD-10-CM

## 2017-08-04 DIAGNOSIS — I129 Hypertensive chronic kidney disease with stage 1 through stage 4 chronic kidney disease, or unspecified chronic kidney disease: Secondary | ICD-10-CM | POA: Insufficient documentation

## 2017-08-04 DIAGNOSIS — I2510 Atherosclerotic heart disease of native coronary artery without angina pectoris: Secondary | ICD-10-CM | POA: Insufficient documentation

## 2017-08-04 DIAGNOSIS — Z79899 Other long term (current) drug therapy: Secondary | ICD-10-CM | POA: Insufficient documentation

## 2017-08-04 DIAGNOSIS — Z87891 Personal history of nicotine dependence: Secondary | ICD-10-CM | POA: Diagnosis not present

## 2017-08-04 DIAGNOSIS — E139 Other specified diabetes mellitus without complications: Secondary | ICD-10-CM | POA: Diagnosis not present

## 2017-08-04 DIAGNOSIS — N189 Chronic kidney disease, unspecified: Secondary | ICD-10-CM | POA: Diagnosis not present

## 2017-08-04 LAB — GLUCOSE, CAPILLARY: Glucose-Capillary: 106 mg/dL — ABNORMAL HIGH (ref 65–99)

## 2017-08-04 NOTE — Discharge Instructions (Addendum)
It is quite possible you are getting the wrong reading on the blood pressure machine you have at home.  Please take it into your doctor to have it rechecked.  If you find your sugars are low drink orange juice.  If they are consistently quite high, between 200 and 300s , or they are higher than that, call you doctor.  Return to the emergency room for any new or worrisome symptoms including chest pain shortness of breath nausea vomiting headache.  If you have a nosebleed that lasts for more than 20 minutes despite applying direct pressure as discussed, please return to the emergency room.  Otherwise, continue your excellent work on your diabetes and diet interventions.

## 2017-08-04 NOTE — ED Provider Notes (Signed)
[facility] Emergency Department Provider Note  ____________________________________________   I have reviewed the triage vital signs and the nursing notes. Where available I have reviewed prior notes and, if possible and indicated, outside hospital notes.    HISTORY  Chief Complaint Hypertension    HPI Luke Rojas is a 72 y.o. male presents here complaining of no actual physical symptoms but has had some numbers that are of concern to him recently.  Patient has a history of newly diagnosed diabetes which he is managing very well with diet.  His highest sugar he has seen is 146.  He woke up yesterday morning and found that his sugar was in the 60s, he did not feel symptomatically but he took some orange juice as he was instructed to do and it came up.  He takes no insulin and no medications for diabetes.  This was likely his natural diurnal rhythm.  In any event, that was concerning to him.  He also purchased a brand-new blood pressure machine for his house, and took his pressure this morning and said it was 190.  He had no symptoms with this.  No chest pain or shortness of breath etc.  However, he was concerned about this elevated reading and he wanted to come in and make sure that nothing else was a mess.  He has not had any headache chest pain focal numbness focal weakness chest pain or exertional symptoms leg swelling or any other complaints aside from these a variance numbers that he is noticing now that he is checking things at home.  Has had no recent change in his medications for his blood pressure, and he denies any symptoms.  He states he did have a slight nosebleed yesterday but that it lasted for a few seconds and then was gone when he thinks he scratched his nose and he states that he is somewhat anxious about his new diagnosis of diabetes.   Past Medical History:  Diagnosis Date  . Chronic kidney disease    kidney stones  . Coronary artery disease     2 stents   . Diabetes mellitus without complication ([place])   . GERD (gastroesophageal reflux disease)   . Hypercholesteremia   . Hypertension   . Myocardial infarction [facility]) 2013    Patient Active Problem List   Diagnosis Date Noted  . Lumbar radiculopathy (R-sided) L4 07/04/2017  . 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 Lumbar facet arthropathy 07/04/2017  . 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 Foraminal stenosis of thoracic region 07/04/2017  . Chronic pain syndrome 07/04/2017  . Encounter for screening colonoscopy 05/04/2015  . Arthritis 10/08/2014  . H/O acute myocardial infarction 10/08/2014  . Acid reflux 10/08/2014  . HLD (hyperlipidemia) 10/08/2014  . BP (high blood pressure) 10/08/2014  . 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 Benign neoplasm of skin 07/19/2006  . Essential (primary) hypertension 11/15/2005  . Decreased potassium in the blood 11/15/2005    Past Surgical History:  Procedure Laterality Date  . CARDIAC CATHETERIZATION  2013   2 stents  . COLONOSCOPY  04/27/05  . COLONOSCOPY WITH PROPOFOL N/A 06/08/2015   Procedure: COLONOSCOPY WITH PROPOFOL;  Surgeon: Robert Bellow, MD;  Location: [facility] ENDOSCOPY;  Service: Endoscopy;  Laterality: N/A;  . CYSTOSCOPY WITH STENT PLACEMENT Right 09/16/2016   Procedure: CYSTOSCOPY WITH STENT PLACEMENT;  Surgeon: Royston Cowper, MD;  Location: [facility] ORS;  Service: Urology;  Laterality: Right;  .  EXTRACORPOREAL SHOCK WAVE LITHOTRIPSY Left 11/04/2014   has had 2 previous lithotripsies  . EXTRACORPOREAL SHOCK WAVE LITHOTRIPSY Left 03/24/2015   Procedure: EXTRACORPOREAL SHOCK WAVE LITHOTRIPSY (ESWL);  Surgeon: Royston Cowper, MD;  Location: [facility] ORS;  Service: Urology;  Laterality: Left;  . EXTRACORPOREAL SHOCK WAVE LITHOTRIPSY Right 10/04/2016   Procedure: EXTRACORPOREAL SHOCK WAVE LITHOTRIPSY (ESWL);  Surgeon: Royston Cowper, MD;  Location: [facility] ORS;  Service:  Urology;  Laterality: Right;    Prior to Admission medications   Medication Sig Start Date End Date Taking? Authorizing Provider  atorvastatin (LIPITOR) 40 MG tablet Take 1 tablet by mouth daily. 09/29/11   [provider]  co-enzyme Q-10 30 MG capsule Take 30 mg by mouth 3 (three) times daily.    [provider]  diclofenac sodium (VOLTAREN) 1 % GEL Apply 2 g topically 4 (four) times daily. 07/08/17   Chrismon, Vickki Muff, PA  finasteride (PROSCAR) 5 MG tablet Take 5 mg by mouth every evening. 06/26/16   [provider]  glucose blood test strip Test fasting blood glucose each morning before breakfast. 07/08/17   Chrismon, Vickki Muff, PA  Lancets [facility] ULTRASOFT) lancets Test fasting glucose level each morning before breakfast. 07/08/17   Chrismon, Vickki Muff, PA  meloxicam (MOBIC) 15 MG tablet TAKE 1 TABLET BY MOUTH ONCE A DAY 07/18/17   Chrismon, Vickki Muff, PA  methocarbamol (ROBAXIN) 750 MG tablet Take 1 tablet (750 mg total) by mouth 4 (four) times daily. Patient not taking: Reported on 07/24/2017 06/13/17   Chrismon, Vickki Muff, PA  metoprolol (LOPRESSOR) 50 MG tablet Take 50 mg by mouth 2 (two) times daily. Morning and night 07/08/14   [provider]  MULTIPLE VITAMIN PO Take 1 tablet by mouth daily.    [provider]  nitroGLYCERIN (NITROSTAT) 0.4 MG SL tablet Place 0.4 mg under the tongue every 5 (five) minutes as needed. Reported on 03/24/2015 09/29/11   [provider]  Omega-3 Fatty Acids (FISH OIL) 1200 MG CAPS Take 1 capsule by mouth 2 (two) times daily.    [provider]  omeprazole (PRILOSEC) 20 MG capsule Take 20 mg by mouth daily.  01/26/15   [provider]  ramipril (ALTACE) 10 MG capsule Take 1 capsule by mouth daily. 07/08/14   [provider]  tamsulosin (FLOMAX) 0.4 MG CAPS capsule Take 1 capsule (0.4 mg total) by mouth daily after breakfast. 04/18/16   Gregor Hams, MD    Allergies Codeine  Family  History  Problem Relation Age of Onset  . Pulmonary embolism Mother   . Transient ischemic attack Mother   . Diabetes Mother   . Pancreatic cancer Father   . Hypertension Father   . Diabetes Father   . Cirrhosis Brother     Social History Social History   Tobacco Use  . Smoking status: Former Smoker    Packs/day: 1.00    Years: 30.00    Pack years: 30.00    Types: Cigarettes    Last attempt to quit: 03/05/1984    Years since quitting: 33.4  . Smokeless tobacco: Current User    Types: Chew  Substance Use Topics  . Alcohol use: No    Alcohol/week: 0.0 oz  . Drug use: No    Review of Systems Constitutional: No fever/chills Eyes: No visual changes. ENT: No sore throat. No stiff neck no neck pain Cardiovascular: Denies chest pain. Respiratory: Denies shortness of breath. Gastrointestinal:   no vomiting.  No diarrhea.  No constipation. Genitourinary: Negative for dysuria. Musculoskeletal: Negative lower extremity swelling Skin: Negative for rash. Neurological: Negative for severe headaches, focal weakness or numbness.   ____________________________________________   PHYSICAL EXAM:  VITAL SIGNS: ED Triage Vitals  Enc Vitals Group     BP 08/04/17 0729 (!) 174/79     Pulse Rate 08/04/17 0729 62     Resp 08/04/17 0729 16     Temp 08/04/17 0729 97.8 F (36.6 C)     Temp Source 08/04/17 0729 Oral     SpO2 08/04/17 0729 100 %     Weight 08/04/17 0730 185 lb (83.9 kg)     Height 08/04/17 0730 5\' 8"  (1.727 m)     Head Circumference --      Peak Flow --      Pain Score 08/04/17 0729 0     Pain Loc --      Pain Edu? --      Excl. in [place]? --     Constitutional: Alert and oriented. Well appearing and in no acute distress. Eyes: Conjunctivae are normal Head: Atraumatic HEENT: No congestion/rhinnorhea. Mucous membranes are moist.  Oropharynx non-erythematous Neck:   Nontender with no meningismus, no masses, no stridor Cardiovascular: Normal rate, regular rhythm.  Grossly normal heart sounds.  Good peripheral circulation. Respiratory: Normal respiratory effort.  No retractions. Lungs CTAB. Abdominal: Soft and nontender. No distention. No guarding no rebound Back:  There is no focal tenderness or step off.  there is no midline tenderness there are no lesions noted. there is no CVA tenderness Musculoskeletal: No lower extremity tenderness, no upper extremity tenderness. No joint effusions, no DVT signs strong distal pulses no edema Neurologic:  Normal speech and language. No gross focal neurologic deficits are appreciated.  Skin:  Skin is warm, dry and intact. No rash noted. Psychiatric: Mood and affect are normal. Speech and behavior are normal.  ____________________________________________   LABS (all labs ordered are listed, but only abnormal results are displayed)  Labs Reviewed  GLUCOSE, CAPILLARY - Abnormal; Notable for the following components:      Result Value   Glucose-Capillary 106 (*)    All other components within normal limits    Pertinent labs  results that were available during my care of the patient were reviewed by me and considered in my medical decision making (see chart for details). ____________________________________________  EKG  I personally interpreted any EKGs ordered by me or triage  ____________________________________________  RADIOLOGY  Pertinent labs & imaging results that were available during my care of the patient were reviewed by me and considered in my medical decision making (see chart for details). If possible, patient and/or family made aware of any abnormal findings.  No results found. ____________________________________________    PROCEDURES  Procedure(s) performed: None  Procedures  Critical Care performed: None  ____________________________________________   INITIAL IMPRESSION / ASSESSMENT AND PLAN / ED COURSE  Pertinent labs & imaging results that were available during my care of  the patient were reviewed by me and considered in my medical decision making (see chart for details).  Patient with normal blood sugar here normal blood pressure here are close enough to it, he has had elevated blood pressure his entire life.  I have urged him to take his blood pressure machine into his primary care doctor and have it calibrated and likely should avoid using it until that time.  He has no evidence of ACS PE dissection, nothing to suggest that he is having hypertensive crisis,  he is slightly above his baseline he states but he admits to being anxious and I think that is the case.  Patient's blood sugar was slightly low yesterday when he woke up this is not attributable to medication as he is not taking medication and it was easily corrected with when she was about immediately up into the 70s, I did recommend that he keep close tabs on these things and to follow-up with his doctor but there is no evidence of any insulinoma or other pathology likely to require intervention.  Patient very reassured by all of our discussion.  Given that he has no symptoms I do not see any work-up is indicated and we will discharge with close outpatient follow-up.  I did offer him baseline blood work to make sure his BUN/creatinine etc. are okay, but he declined stating that his doctor takes care of all that I think that is much more reasonable than keeping him in the emergency department with no signs or symptoms of disease.  However return precautions and follow-up of been given and understood    ____________________________________________   FINAL CLINICAL IMPRESSION(S) / ED DIAGNOSES  Final diagnoses:  None      This chart was dictated using voice recognition software.  Despite best efforts to proofread,  errors can occur which can change meaning.      Schuyler Amor, MD 08/04/17 [phone]

## 2017-08-04 NOTE — ED Triage Notes (Signed)
Pt c/o b/p being high and having a nose bleed this morning, with feeling "swimmy headed". States yesterday his blood sugar got down to 64 and drank some juice..states he is not on medication for diabetes.Marland Kitchen

## 2017-08-04 NOTE — ED Notes (Signed)
ED Provider at bedside. 

## 2017-08-05 DIAGNOSIS — I2510 Atherosclerotic heart disease of native coronary artery without angina pectoris: Secondary | ICD-10-CM | POA: Insufficient documentation

## 2017-08-06 ENCOUNTER — Ambulatory Visit (INDEPENDENT_AMBULATORY_CARE_PROVIDER_SITE_OTHER): Payer: Medicare Other | Admitting: Family Medicine

## 2017-08-06 ENCOUNTER — Encounter: Payer: Self-pay | Admitting: Family Medicine

## 2017-08-06 VITALS — BP 134/68 | HR 57 | Temp 97.7°F | Wt 187.2 lb

## 2017-08-06 DIAGNOSIS — E119 Type 2 diabetes mellitus without complications: Secondary | ICD-10-CM

## 2017-08-06 DIAGNOSIS — I10 Essential (primary) hypertension: Secondary | ICD-10-CM | POA: Diagnosis not present

## 2017-08-06 NOTE — Progress Notes (Signed)
Patient: Luke Rojas Male    DOB: [birth year]   72 y.o.   MRN: [id number] Visit Date: 08/06/2017  Today's Provider: Vernie Murders, PA   Chief Complaint  Patient presents with  . Diabetes  . Follow-up   Subjective:    HPI  Diabetes Mellitus Type II, Follow-up:  Lab Results  Component Value Date   HGBA1C 6.7 (H) 06/27/2017   Last seen for diabetes 1 months ago.  Management since then includes no changes. Patient advised to check FBS and see Nutritionist. He reports good compliance with treatment. He is not having side effects.  Current symptoms include none  Home blood sugar records: fasting range: 80-low 100's  Episodes of hypoglycemia? yes - 60's on 08/04/17 and patient went to ER   Current Insulin Regimen: none Most Recent Eye Exam: not UTD Weight trend: stable Prior visit with dietician: yes - 08/01/17 Current diet: in general, a "healthy" diet   Current exercise: no regular exercise   Pertinent Labs:    Component Value Date/Time   CHOL 101 06/27/2017 0825   TRIG 160 (H) 06/27/2017 0825   HDL 28 (L) 06/27/2017 0825   LDLCALC 41 06/27/2017 0825   CREATININE 1.24 06/27/2017 0825   CREATININE 1.88 (H) 10/02/2011 1051    Wt Readings from Last 3 Encounters:  08/06/17 187 lb 3.2 oz (84.9 kg)  08/04/17 185 lb (83.9 kg)  08/01/17 185 lb 6.4 oz (84.1 kg)    ------------------------------------------------------------------------ Past Medical History:  Diagnosis Date  . Chronic kidney disease    kidney stones  . Coronary artery disease    2 stents   . Diabetes mellitus without complication ([place])   . GERD (gastroesophageal reflux disease)   . Hypercholesteremia   . Hypertension   . Myocardial infarction [facility]) 2013   Past Surgical History:  Procedure Laterality Date  . CARDIAC CATHETERIZATION  2013   2 stents  . COLONOSCOPY  04/27/05  . COLONOSCOPY WITH PROPOFOL N/A 06/08/2015   Procedure: COLONOSCOPY WITH PROPOFOL;  Surgeon: Robert Bellow, MD;   Location: [facility] ENDOSCOPY;  Service: Endoscopy;  Laterality: N/A;  . CYSTOSCOPY WITH STENT PLACEMENT Right 09/16/2016   Procedure: CYSTOSCOPY WITH STENT PLACEMENT;  Surgeon: Royston Cowper, MD;  Location: [facility] ORS;  Service: Urology;  Laterality: Right;  . EXTRACORPOREAL SHOCK WAVE LITHOTRIPSY Left 11/04/2014   has had 2 previous lithotripsies  . EXTRACORPOREAL SHOCK WAVE LITHOTRIPSY Left 03/24/2015   Procedure: EXTRACORPOREAL SHOCK WAVE LITHOTRIPSY (ESWL);  Surgeon: Royston Cowper, MD;  Location: [facility] ORS;  Service: Urology;  Laterality: Left;  . EXTRACORPOREAL SHOCK WAVE LITHOTRIPSY Right 10/04/2016   Procedure: EXTRACORPOREAL SHOCK WAVE LITHOTRIPSY (ESWL);  Surgeon: Royston Cowper, MD;  Location: [facility] ORS;  Service: Urology;  Laterality: Right;   Family History  Problem Relation Age of Onset  . Pulmonary embolism Mother   . Transient ischemic attack Mother   . Diabetes Mother   . Pancreatic cancer Father   . Hypertension Father   . Diabetes Father   . Cirrhosis Brother    Allergies  Allergen Reactions  . Codeine Nausea Only    Current Outpatient Medications:  .  atorvastatin (LIPITOR) 40 MG tablet, Take 1 tablet by mouth daily., Disp: , Rfl:  .  co-enzyme Q-10 30 MG capsule, Take 30 mg by mouth 3 (three) times daily., Disp: , Rfl:  .  diclofenac sodium (VOLTAREN) 1 % GEL, Apply 2 g topically 4 (four) times daily., Disp: 100 g, Rfl: 3 .  finasteride (PROSCAR) 5 MG tablet, Take 5 mg by mouth every evening., Disp: , Rfl:  .  glucose blood test strip, Test fasting blood glucose each morning before breakfast., Disp: 100 each, Rfl: 4 .  Lancets (ONETOUCH ULTRASOFT) lancets, Test fasting glucose level each morning before breakfast., Disp: 100 each, Rfl: 4 .  meloxicam (MOBIC) 15 MG tablet, TAKE 1 TABLET BY MOUTH ONCE A DAY, Disp: 30 tablet, Rfl: 3 .  metoprolol (LOPRESSOR) 50 MG tablet, Take 50 mg by mouth 2 (two) times daily. Morning and night, Disp: , Rfl:  .  MULTIPLE VITAMIN PO, Take  1 tablet by mouth daily., Disp: , Rfl:  .  nitroGLYCERIN (NITROSTAT) 0.4 MG SL tablet, Place 0.4 mg under the tongue every 5 (five) minutes as needed. Reported on 03/24/2015, Disp: , Rfl:  .  Omega-3 Fatty Acids (FISH OIL) 1200 MG CAPS, Take 1 capsule by mouth 2 (two) times daily., Disp: , Rfl:  .  omeprazole (PRILOSEC) 20 MG capsule, Take 20 mg by mouth daily. , Disp: , Rfl:  .  ramipril (ALTACE) 10 MG capsule, Take 1 capsule by mouth daily., Disp: , Rfl:  .  tamsulosin (FLOMAX) 0.4 MG CAPS capsule, Take 1 capsule (0.4 mg total) by mouth daily after breakfast., Disp: 14 capsule, Rfl: 0 .  methocarbamol (ROBAXIN) 750 MG tablet, Take 1 tablet (750 mg total) by mouth 4 (four) times daily. (Patient not taking: Reported on 07/24/2017), Disp: 40 tablet, Rfl: 1  Review of Systems  Constitutional: Negative.   Respiratory: Negative.   Cardiovascular: Negative.   Endocrine: Negative.    Social History   Tobacco Use  . Smoking status: Former Smoker    Packs/day: 1.00    Years: 30.00    Pack years: 30.00    Types: Cigarettes    Last attempt to quit: 03/05/1984    Years since quitting: 33.4  . Smokeless tobacco: Current User    Types: Chew  Substance Use Topics  . Alcohol use: No    Alcohol/week: 0.0 oz   Objective:   BP 134/68 (BP Location: Right Arm, Patient Position: Sitting, Cuff Size: Normal)   Pulse (!) 57   Temp 97.7 F (36.5 C) (Oral)   Wt 187 lb 3.2 oz (84.9 kg)   SpO2 99%   BMI 28.46 kg/m  Vitals:   08/06/17 1506  BP: 134/68  Pulse: (!) 57  Temp: 97.7 F (36.5 C)  TempSrc: Oral  SpO2: 99%  Weight: 187 lb 3.2 oz (84.9 kg)   Physical Exam  Constitutional: He is oriented to person, place, and time. He appears well-developed and well-nourished. No distress.  HENT:  Head: Normocephalic and atraumatic.  Right Ear: Hearing normal.  Left Ear: Hearing normal.  Nose: Nose normal.  Eyes: Conjunctivae and lids are normal. Right eye exhibits no discharge. Left eye exhibits no  discharge. No scleral icterus.  Cardiovascular: Normal rate and regular rhythm.  Pulmonary/Chest: Effort normal and breath sounds normal. No respiratory distress.  Abdominal: Soft. Bowel sounds are normal.  Musculoskeletal: Normal range of motion.  Neurological: He is alert and oriented to person, place, and time.  Skin: Skin is intact. No lesion and no rash noted.  Psychiatric: He has a normal mood and affect. His speech is normal and behavior is normal. Thought content normal.       Assessment & Plan:     1. Essential (primary) hypertension Well controlled BP today. Had follow up with cardiologist (Dr. Clayborn Bigness) yesterday. Still taking the Ramipril 10  mg qd and Metoprolol 50 mg BID. Continues to limit salt intake and controls amount of caffeine consumption. Continue to monitor BP at home. Wrist cuff reading was 119/72 with immediate in office BP cuff getting 124/70 while sitting quietly. Recheck in 3-4 months.  2. Diabetes mellitus without complication (HCC) FBS in the 70-110 range. Feeling well with only one episode of BS down to 62 and a snack corrected this quickly. Following diabetic diet and exercising regularly per diabetes educator instructions. Essentially well controlled by life style changes and diet, only. Recheck in 3-4 months.       Vernie Murders, PA  [facility]

## 2017-08-08 ENCOUNTER — Encounter: Payer: Medicare Other | Attending: Family Medicine | Admitting: Dietician

## 2017-08-08 ENCOUNTER — Encounter: Payer: Self-pay | Admitting: Dietician

## 2017-08-08 VITALS — BP 120/72 | Ht 68.0 in | Wt 188.4 lb

## 2017-08-08 DIAGNOSIS — Z713 Dietary counseling and surveillance: Secondary | ICD-10-CM | POA: Insufficient documentation

## 2017-08-08 DIAGNOSIS — E119 Type 2 diabetes mellitus without complications: Secondary | ICD-10-CM

## 2017-08-08 NOTE — Progress Notes (Signed)

## 2017-08-12 ENCOUNTER — Encounter: Payer: Self-pay | Admitting: *Deleted

## 2017-08-29 ENCOUNTER — Other Ambulatory Visit: Payer: Self-pay | Admitting: Family Medicine

## 2017-08-29 DIAGNOSIS — E119 Type 2 diabetes mellitus without complications: Secondary | ICD-10-CM

## 2017-08-29 MED ORDER — GLUCOSE BLOOD VI STRP: ORAL_STRIP | 4 refills | Status: DC

## 2017-08-29 NOTE — Telephone Encounter (Signed)
Pt needs new refill on his test strips  One touch verio.  Using more than directed   [place]

## 2017-08-30 ENCOUNTER — Telehealth: Payer: Self-pay | Admitting: Family Medicine

## 2017-08-30 NOTE — Telephone Encounter (Signed)
RX was sent to pharmacy on 08/29/17. E-Prescribing Status: Receipt confirmed by pharmacy (08/29/2017 2:12 PM EDT)

## 2017-08-30 NOTE — Telephone Encounter (Signed)
[facility] sent fax requesting new Rx for  glucose blood test strip. The fax stated that pt is testing more than once a day and they are requesting new Rx with increased quantity. Please advise. Thanks TNP

## 2017-11-13 ENCOUNTER — Other Ambulatory Visit: Payer: Self-pay | Admitting: Family Medicine

## 2017-11-13 DIAGNOSIS — M47819 Spondylosis without myelopathy or radiculopathy, site unspecified: Secondary | ICD-10-CM

## 2017-11-22 ENCOUNTER — Ambulatory Visit (INDEPENDENT_AMBULATORY_CARE_PROVIDER_SITE_OTHER): Payer: Medicare Other | Admitting: Family Medicine

## 2017-11-22 ENCOUNTER — Encounter: Payer: Self-pay | Admitting: Family Medicine

## 2017-11-22 ENCOUNTER — Ambulatory Visit (INDEPENDENT_AMBULATORY_CARE_PROVIDER_SITE_OTHER): Payer: Medicare Other

## 2017-11-22 VITALS — BP 118/60 | HR 54 | Temp 98.0°F | Ht 68.0 in | Wt 185.6 lb

## 2017-11-22 VITALS — BP 118/60 | HR 54 | Temp 98.0°F | Ht 68.0 in | Wt 185.0 lb

## 2017-11-22 DIAGNOSIS — E782 Mixed hyperlipidemia: Secondary | ICD-10-CM | POA: Diagnosis not present

## 2017-11-22 DIAGNOSIS — E119 Type 2 diabetes mellitus without complications: Secondary | ICD-10-CM

## 2017-11-22 DIAGNOSIS — Z23 Encounter for immunization: Secondary | ICD-10-CM | POA: Diagnosis not present

## 2017-11-22 DIAGNOSIS — Z0000 Encounter for general adult medical examination without abnormal findings: Secondary | ICD-10-CM | POA: Diagnosis not present

## 2017-11-22 DIAGNOSIS — I10 Essential (primary) hypertension: Secondary | ICD-10-CM

## 2017-11-22 DIAGNOSIS — I252 Old myocardial infarction: Secondary | ICD-10-CM | POA: Diagnosis not present

## 2017-11-22 DIAGNOSIS — M1990 Unspecified osteoarthritis, unspecified site: Secondary | ICD-10-CM

## 2017-11-22 DIAGNOSIS — Z87442 Personal history of urinary calculi: Secondary | ICD-10-CM

## 2017-11-22 NOTE — Progress Notes (Addendum)
Subjective:   Luke Rojas is a 72 y.o. male who presents for Medicare Annual/Subsequent preventive examination.  Review of Systems:  N/A  Cardiac Risk Factors include: advanced age (>69men, >86 women);diabetes mellitus;dyslipidemia;hypertension;male gender;smoking/ tobacco exposure     Objective:    Vitals: BP 118/60 (BP Location: Right Arm)   Pulse (!) 54   Temp 98 F (36.7 C) (Oral)   Ht 5\' 8"  (1.727 m)   Wt 185 lb 9.6 oz (84.2 kg)   BMI 28.22 kg/m   Body mass index is 28.22 kg/m.  Advanced Directives 11/22/2017 07/24/2017 07/04/2017 10/30/2016 10/04/2016 09/16/2016 04/18/2016  Does Patient Have a Medical Advance Directive? No No No No No No No  Would patient like information on creating a medical advance directive? No - Patient declined No - Patient declined Yes (MAU/Ambulatory/Procedural Areas - Information given) Yes (ED - Information included in AVS) No - Patient declined No - Patient declined No - Patient declined    Tobacco Social History   Tobacco Use  Smoking Status Former Smoker  . Packs/day: 1.00  . Years: 30.00  . Pack years: 30.00  . Types: Cigarettes  . Last attempt to quit: 03/05/1984  . Years since quitting: 33.7  Smokeless Tobacco Current User  . Types: Chew     Ready to quit: Not Answered Counseling given: Not Answered   Clinical Intake:  Pre-visit preparation completed: Yes  Pain : No/denies pain Pain Score: 0-No pain     Nutritional Status: BMI 25 -29 Overweight Nutritional Risks: None Diabetes: Yes(type 2) CBG done?: No Did pt. bring in CBG monitor from home?: No  How often do you need to have someone help you when you read instructions, pamphlets, or other written materials from your doctor or pharmacy?: 1 - Never  Interpreter Needed?: No  Information entered by :: [facility], LPN  Past Medical History:  Diagnosis Date  . Chronic kidney disease    kidney stones  . Coronary artery disease    2 stents   . Diabetes mellitus  without complication ([place])   . GERD (gastroesophageal reflux disease)   . Hypercholesteremia   . Hypertension   . Myocardial infarction [facility]) 2013   Past Surgical History:  Procedure Laterality Date  . CARDIAC CATHETERIZATION  2013   2 stents  . COLONOSCOPY  04/27/05  . COLONOSCOPY WITH PROPOFOL N/A 06/08/2015   Procedure: COLONOSCOPY WITH PROPOFOL;  Surgeon: Robert Bellow, MD;  Location: [facility] ENDOSCOPY;  Service: Endoscopy;  Laterality: N/A;  . CYSTOSCOPY WITH STENT PLACEMENT Right 09/16/2016   Procedure: CYSTOSCOPY WITH STENT PLACEMENT;  Surgeon: Royston Cowper, MD;  Location: [facility] ORS;  Service: Urology;  Laterality: Right;  . EXTRACORPOREAL SHOCK WAVE LITHOTRIPSY Left 11/04/2014   has had 2 previous lithotripsies  . EXTRACORPOREAL SHOCK WAVE LITHOTRIPSY Left 03/24/2015   Procedure: EXTRACORPOREAL SHOCK WAVE LITHOTRIPSY (ESWL);  Surgeon: Royston Cowper, MD;  Location: [facility] ORS;  Service: Urology;  Laterality: Left;  . EXTRACORPOREAL SHOCK WAVE LITHOTRIPSY Right 10/04/2016   Procedure: EXTRACORPOREAL SHOCK WAVE LITHOTRIPSY (ESWL);  Surgeon: Royston Cowper, MD;  Location: [facility] ORS;  Service: Urology;  Laterality: Right;   Family History  Problem Relation Age of Onset  . Pulmonary embolism Mother   . Transient ischemic attack Mother   . Diabetes Mother   . Pancreatic cancer Father   . Hypertension Father   . Diabetes Father   . Cirrhosis Brother    Social History   Socioeconomic History  . Marital status: Married  Spouse name: Not on file  . Number of children: 2  . Years of education: Not on file  . Highest education level: 8th grade  Occupational History  . Occupation: retired  Scientific laboratory technician  . Financial resource strain: Not hard at all  . Food insecurity:    Worry: Never true    Inability: Never true  . Transportation needs:    Medical: No    Non-medical: No  Tobacco Use  . Smoking status: Former Smoker    Packs/day: 1.00    Years: 30.00    Pack years: 30.00      Types: Cigarettes    Last attempt to quit: 03/05/1984    Years since quitting: 33.7  . Smokeless tobacco: Current User    Types: Chew  Substance and Sexual Activity  . Alcohol use: No    Alcohol/week: 0.0 standard drinks  . Drug use: No  . Sexual activity: Not on file  Lifestyle  . Physical activity:    Days per week: Not on file    Minutes per session: Not on file  . Stress: Not at all  Relationships  . Social connections:    Talks on phone: Not on file    Gets together: Not on file    Attends religious service: Not on file    Active member of club or organization: Not on file    Attends meetings of clubs or organizations: Not on file    Relationship status: Not on file  Other Topics Concern  . Not on file  Social History Narrative  . Not on file    Outpatient Encounter Medications as of 11/22/2017  Medication Sig  . atorvastatin (LIPITOR) 40 MG tablet Take 1 tablet by mouth daily.  Marland Kitchen co-enzyme Q-10 30 MG capsule Take 30 mg by mouth 3 (three) times daily.  . diclofenac sodium (VOLTAREN) 1 % GEL Apply 2 g topically 4 (four) times daily.  . finasteride (PROSCAR) 5 MG tablet Take 5 mg by mouth every evening.  Marland Kitchen glucose blood test strip Test fasting blood glucose each morning before breakfast.  . Lancets (ONETOUCH ULTRASOFT) lancets Test fasting glucose level each morning before breakfast.  . meloxicam (MOBIC) 15 MG tablet TAKE 1 TABLET BY MOUTH ONCE DAILY  . metoprolol (LOPRESSOR) 50 MG tablet Take 50 mg by mouth 2 (two) times daily. Morning and night  . MULTIPLE VITAMIN PO Take 1 tablet by mouth daily.  . nitroGLYCERIN (NITROSTAT) 0.4 MG SL tablet Place 0.4 mg under the tongue every 5 (five) minutes as needed. Reported on 03/24/2015  . Omega-3 Fatty Acids (FISH OIL) 1200 MG CAPS Take 1 capsule by mouth 2 (two) times daily.  Marland Kitchen omeprazole (PRILOSEC) 20 MG capsule Take 20 mg by mouth daily.   . ramipril (ALTACE) 10 MG capsule Take 1 capsule by mouth daily.  . tamsulosin  (FLOMAX) 0.4 MG CAPS capsule Take 1 capsule (0.4 mg total) by mouth daily after breakfast.  . methocarbamol (ROBAXIN) 750 MG tablet Take 1 tablet (750 mg total) by mouth 4 (four) times daily. (Patient not taking: Reported on 07/24/2017)   No facility-administered encounter medications on file as of 11/22/2017.     Activities of Daily Living In your present state of health, do you have any difficulty performing the following activities: 11/22/2017  Hearing? N  Vision? N  Difficulty concentrating or making decisions? N  Walking or climbing stairs? N  Dressing or bathing? N  Doing errands, shopping? N  Preparing Food and eating ?  N  Using the Toilet? N  In the past six months, have you accidently leaked urine? N  Do you have problems with loss of bowel control? N  Managing your Medications? N  Managing your Finances? N  Housekeeping or managing your Housekeeping? N  Some recent data might be hidden    Patient Care Team: Chrismon, Vickki Muff, PA as PCP - General (Physician Assistant) Thelma Comp, [place] as Consulting Physician (Optometry) Royston Cowper, MD as Consulting Physician (Urology)   Assessment:   This is a routine wellness examination for Luke Rojas.  Exercise Activities and Dietary recommendations Current Exercise Habits: Home exercise routine, Type of exercise: walking, Time (Minutes): 35, Frequency (Times/Week): 3, Weekly Exercise (Minutes/Week): 105, Intensity: Mild, Exercise limited by: None identified  Goals    . Increase water intake     Continue drinking 6-8 glasses of water a day. Pt declined diet or exercise change.       Fall Risk Fall Risk  11/22/2017 08/08/2017 08/01/2017 07/25/2017 07/24/2017  Falls in the past year? No Yes Yes No No  Comment - no falls in past week - - -  Number falls in past yr: - - 1 - -  Injury with Fall? - - No - -  Comment - - fell back on railing on porch - slipped on water - -  Follow up - - Falls prevention discussed - -   Is the  patient's home free of loose throw rugs in walkways, pet beds, electrical cords, etc?   yes      Grab bars in the bathroom? no      Handrails on the stairs?   no      Adequate lighting?   yes  Timed Get Up and Go Performed: N/A  Depression Screen PHQ 2/9 Scores 11/22/2017 07/24/2017 07/04/2017 10/30/2016  PHQ - 2 Score 0 0 0 0    Cognitive Function: Pt declined screening today.         Immunization History  Administered Date(s) Administered  . Influenza Split 03/10/2010, 03/11/2012  . Influenza, High Dose Seasonal PF 01/07/2014, 10/31/2015, 10/30/2016, 11/22/2017  . Influenza,inj,Quad PF,6+ Mos 12/09/2012  . Pneumococcal Conjugate-13 01/07/2014  . Pneumococcal Polysaccharide-23 03/31/2015  . Tdap 09/23/2009  . Zoster 04/02/2011    Qualifies for Shingles Vaccine? Due for Shingles vaccine. Declined my offer to administer today. Education has been provided regarding the importance of this vaccine. Pt has been advised to call her insurance company to determine her out of pocket expense. Advised she may also receive this vaccine at her local pharmacy or Health Dept. Verbalized acceptance and understanding.  Screening Tests Health Maintenance  Topic Date Due  . OPHTHALMOLOGY EXAM  01/03/1956  . HEMOGLOBIN A1C  12/27/2017  . FOOT EXAM  07/09/2018  . TETANUS/TDAP  09/24/2019  . COLONOSCOPY  06/07/2025  . INFLUENZA VACCINE  Completed  . Hepatitis C Screening  Completed  . PNA vac Low Risk Adult  Completed   Cancer Screenings: Lung: Low Dose CT Chest recommended if Age 55-80 years, 30 pack-year currently smoking OR have quit w/in 15years. Patient does not qualify. Colorectal: Up to date  Additional Screenings:  Hepatitis C Screening: Up to date      Plan:  I have personally reviewed and addressed the Medicare Annual Wellness questionnaire and have noted the following in the patient's chart:  A. Medical and social history B. Use of alcohol, tobacco or illicit drugs  C. Current  medications and supplements D. Functional ability and  status E.  Nutritional status F.  Physical activity G. Advance directives H. List of other physicians I.  Hospitalizations, surgeries, and ER visits in previous 12 months J.  [place] such as hearing and vision if needed, cognitive and depression L. Referrals and appointments - none  In addition, I have reviewed and discussed with patient certain preventive protocols, quality metrics, and best practice recommendations. A written personalized care plan for preventive services as well as general preventive health recommendations were provided to patient.  See attached scanned questionnaire for additional information.   Signed,  Fabio Neighbors, LPN Nurse Health Advisor   Nurse Recommendations: Pt states he has had an eye exam completed this year. Will contact [facility] to retrieve records.   Reviewed documentation and recommendations of Nurse Health Advisor. Available for consultation during screening. Agree with plan and recommendations.

## 2017-11-22 NOTE — Patient Instructions (Addendum)
[name] , Thank you for taking time to come for your Medicare Wellness Visit. I appreciate your ongoing commitment to your health goals. Please review the following plan we discussed and let me know if I can assist you in the future.   Screening recommendations/referrals: Colonoscopy: Up to date Recommended yearly ophthalmology/optometry visit for glaucoma screening and checkup Recommended yearly dental visit for hygiene and checkup  Vaccinations: Influenza vaccine: Up to date Pneumococcal vaccine: Up to date Tdap vaccine: Up to date Shingles vaccine: Pt declines today.     Advanced directives: Please bring a copy of your POA (Power of Attorney) and/or Living Will to your next appointment, once completed.   Conditions/risks identified: Recommend to continue trying to increase water intake to at least 6 glasses a day.   Next appointment: 10:40 AM today with Vernie Murders.  Preventive Care 73 Years and Older, Male Preventive care refers to lifestyle choices and visits with your health care provider that can promote health and wellness. What does preventive care include?  A yearly physical exam. This is also called an annual well check.  Dental exams once or twice a year.  Routine eye exams. Ask your health care provider how often you should have your eyes checked.  Personal lifestyle choices, including:  Daily care of your teeth and gums.  Regular physical activity.  Eating a healthy diet.  Avoiding tobacco and drug use.  Limiting alcohol use.  Practicing safe sex.  Taking low doses of aspirin every day.  Taking vitamin and mineral supplements as recommended by your health care provider. What happens during an annual well check? The services and screenings done by your health care provider during your annual well check will depend on your age, overall health, lifestyle risk factors, and family history of disease. Counseling  Your health care provider may ask you  questions about your:  Alcohol use.  Tobacco use.  Drug use.  Emotional well-being.  Home and relationship well-being.  Sexual activity.  Eating habits.  History of falls.  Memory and ability to understand (cognition).  Work and work Statistician. Screening  You may have the following tests or measurements:  Height, weight, and BMI.  Blood pressure.  Lipid and cholesterol levels. These may be checked every 5 years, or more frequently if you are over 62 years old.  Skin check.  Lung cancer screening. You may have this screening every year starting at age 19 if you have a 30-pack-year history of smoking and currently smoke or have quit within the past 15 years.  Fecal occult blood test (FOBT) of the stool. You may have this test every year starting at age 71.  Flexible sigmoidoscopy or colonoscopy. You may have a sigmoidoscopy every 5 years or a colonoscopy every 10 years starting at age 30.  Prostate cancer screening. Recommendations will vary depending on your family history and other risks.  Hepatitis C blood test.  Hepatitis B blood test.  Sexually transmitted disease (STD) testing.  Diabetes screening. This is done by checking your blood sugar (glucose) after you have not eaten for a while (fasting). You may have this done every 1-3 years.  Abdominal aortic aneurysm (AAA) screening. You may need this if you are a current or former smoker.  Osteoporosis. You may be screened starting at age 82 if you are at high risk. Talk with your health care provider about your test results, treatment options, and if necessary, the need for more tests. Vaccines  Your health care provider  may recommend certain vaccines, such as:  Influenza vaccine. This is recommended every year.  Tetanus, diphtheria, and acellular pertussis (Tdap, Td) vaccine. You may need a Td booster every 10 years.  Zoster vaccine. You may need this after age 79.  Pneumococcal 13-valent conjugate  (PCV13) vaccine. One dose is recommended after age 24.  Pneumococcal polysaccharide (PPSV23) vaccine. One dose is recommended after age 10. Talk to your health care provider about which screenings and vaccines you need and how often you need them. This information is not intended to replace advice given to you by your health care provider. Make sure you discuss any questions you have with your health care provider. Document Released: 03/18/2015 Document Revised: 11/09/2015 Document Reviewed: 12/21/2014 Elsevier Interactive Patient Education  2017 [employer] Prevention in the Home Falls can cause injuries. They can happen to people of all ages. There are many things you can do to make your home safe and to help prevent falls. What can I do on the outside of my home?  Regularly fix the edges of walkways and driveways and fix any cracks.  Remove anything that might make you trip as you walk through a door, such as a raised step or threshold.  Trim any bushes or trees on the path to your home.  Use bright outdoor lighting.  Clear any walking paths of anything that might make someone trip, such as rocks or tools.  Regularly check to see if handrails are loose or broken. Make sure that both sides of any steps have handrails.  Any raised decks and porches should have guardrails on the edges.  Have any leaves, snow, or ice cleared regularly.  Use sand or salt on walking paths during winter.  Clean up any spills in your garage right away. This includes oil or grease spills. What can I do in the bathroom?  Use night lights.  Install grab bars by the toilet and in the tub and shower. Do not use towel bars as grab bars.  Use non-skid mats or decals in the tub or shower.  If you need to sit down in the shower, use a plastic, non-slip stool.  Keep the floor dry. Clean up any water that spills on the floor as soon as it happens.  Remove soap buildup in the tub or shower  regularly.  Attach bath mats securely with double-sided non-slip rug tape.  Do not have throw rugs and other things on the floor that can make you trip. What can I do in the bedroom?  Use night lights.  Make sure that you have a light by your bed that is easy to reach.  Do not use any sheets or blankets that are too big for your bed. They should not hang down onto the floor.  Have a firm chair that has side arms. You can use this for support while you get dressed.  Do not have throw rugs and other things on the floor that can make you trip. What can I do in the kitchen?  Clean up any spills right away.  Avoid walking on wet floors.  Keep items that you use a lot in easy-to-reach places.  If you need to reach something above you, use a strong step stool that has a grab bar.  Keep electrical cords out of the way.  Do not use floor polish or wax that makes floors slippery. If you must use wax, use non-skid floor wax.  Do not have throw rugs  and other things on the floor that can make you trip. What can I do with my stairs?  Do not leave any items on the stairs.  Make sure that there are handrails on both sides of the stairs and use them. Fix handrails that are broken or loose. Make sure that handrails are as long as the stairways.  Check any carpeting to make sure that it is firmly attached to the stairs. Fix any carpet that is loose or worn.  Avoid having throw rugs at the top or bottom of the stairs. If you do have throw rugs, attach them to the floor with carpet tape.  Make sure that you have a light switch at the top of the stairs and the bottom of the stairs. If you do not have them, ask someone to add them for you. What else can I do to help prevent falls?  Wear shoes that:  Do not have high heels.  Have rubber bottoms.  Are comfortable and fit you well.  Are closed at the toe. Do not wear sandals.  If you use a stepladder:  Make sure that it is fully  opened. Do not climb a closed stepladder.  Make sure that both sides of the stepladder are locked into place.  Ask someone to hold it for you, if possible.  Clearly mark and make sure that you can see:  Any grab bars or handrails.  First and last steps.  Where the edge of each step is.  Use tools that help you move around (mobility aids) if they are needed. These include:  Canes.  Walkers.  Scooters.  Crutches.  Turn on the lights when you go into a dark area. Replace any light bulbs as soon as they burn out.  Set up your furniture so you have a clear path. Avoid moving your furniture around.  If any of your floors are uneven, fix them.  If there are any pets around you, be aware of where they are.  Review your medicines with your doctor. Some medicines can make you feel dizzy. This can increase your chance of falling. Ask your doctor what other things that you can do to help prevent falls. This information is not intended to replace advice given to you by your health care provider. Make sure you discuss any questions you have with your health care provider. Document Released: 12/16/2008 Document Revised: 07/28/2015 Document Reviewed: 03/26/2014 Elsevier Interactive Patient Education  2017 [employer].

## 2017-11-22 NOTE — Progress Notes (Signed)
Patient: Luke Rojas, Male    DOB: 1945/07/28, 72 y.o.   MRN: [id number] Visit Date: 11/22/2017  Today's Provider: Vernie Murders, PA    Subjective:     Complete Physical Luke Rojas is a 72 y.o. male. He feels well. He reports exercising regularly.  Walks 3-4 times a week.   He reports he is sleeping well.  -----------------------------------------------------------   Review of Systems  Constitutional: Negative.   HENT: Negative.   Eyes: Negative.   Respiratory: Negative.   Cardiovascular: Negative.   Gastrointestinal: Negative.   Endocrine: Negative.   Genitourinary: Negative.   Musculoskeletal: Positive for arthralgias and back pain. Negative for gait problem, joint swelling, myalgias, neck pain and neck stiffness.       History of Arthritis.   Skin: Negative.   Allergic/Immunologic: Negative.   Neurological: Negative.   Hematological: Negative.   Psychiatric/Behavioral: Negative.     Social History   Socioeconomic History  . Marital status: Married    Spouse name: Not on file  . Number of children: 2  . Years of education: Not on file  . Highest education level: 8th grade  Occupational History  . Occupation: retired  Scientific laboratory technician  . Financial resource strain: Not hard at all  . Food insecurity:    Worry: Never true    Inability: Never true  . Transportation needs:    Medical: No    Non-medical: No  Tobacco Use  . Smoking status: Former Smoker    Packs/day: 1.00    Years: 30.00    Pack years: 30.00    Types: Cigarettes    Last attempt to quit: 03/05/1984    Years since quitting: 33.7  . Smokeless tobacco: Current User    Types: Chew  Substance and Sexual Activity  . Alcohol use: No    Alcohol/week: 0.0 standard drinks  . Drug use: No  . Sexual activity: Not on file  Lifestyle  . Physical activity:    Days per week: Not on file    Minutes per session: Not on file  . Stress: Not at all  Relationships  . Social  connections:    Talks on phone: Not on file    Gets together: Not on file    Attends religious service: Not on file    Active member of club or organization: Not on file    Attends meetings of clubs or organizations: Not on file    Relationship status: Not on file  . Intimate partner violence:    Fear of current or ex partner: Not on file    Emotionally abused: Not on file    Physically abused: Not on file    Forced sexual activity: Not on file  Other Topics Concern  . Not on file  Social History Narrative  . Not on file    Past Medical History:  Diagnosis Date  . Chronic kidney disease    kidney stones  . Coronary artery disease    2 stents   . Diabetes mellitus without complication ([place])   . GERD (gastroesophageal reflux disease)   . Hypercholesteremia   . Hypertension   . Myocardial infarction [facility]) 2013     Patient Active Problem List   Diagnosis Date Noted  . Coronary artery disease involving native coronary artery without angina pectoris 08/05/2017  . Lumbar radiculopathy (R-sided) L4 07/04/2017  . 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 Lumbar facet arthropathy 07/04/2017  .  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 Foraminal stenosis of thoracic region 07/04/2017  . Chronic pain syndrome 07/04/2017  . Encounter for screening colonoscopy 05/04/2015  . Arthritis 10/08/2014  . H/O acute myocardial infarction 10/08/2014  . Acid reflux 10/08/2014  . HLD (hyperlipidemia) 10/08/2014  . BP (high blood pressure) 10/08/2014  . Hyperlipidemia 12/05/2007  . Benign neoplasm of skin 07/19/2006  . Essential (primary) hypertension 11/15/2005  . Decreased potassium in the blood 11/15/2005    Past Surgical History:  Procedure Laterality Date  . CARDIAC CATHETERIZATION  2013   2 stents  . COLONOSCOPY  04/27/05  . COLONOSCOPY WITH PROPOFOL N/A 06/08/2015   Procedure: COLONOSCOPY WITH PROPOFOL;  Surgeon: Robert Bellow, MD;  Location: [facility]  ENDOSCOPY;  Service: Endoscopy;  Laterality: N/A;  . CYSTOSCOPY WITH STENT PLACEMENT Right 09/16/2016   Procedure: CYSTOSCOPY WITH STENT PLACEMENT;  Surgeon: Royston Cowper, MD;  Location: [facility] ORS;  Service: Urology;  Laterality: Right;  . EXTRACORPOREAL SHOCK WAVE LITHOTRIPSY Left 11/04/2014   has had 2 previous lithotripsies  . EXTRACORPOREAL SHOCK WAVE LITHOTRIPSY Left 03/24/2015   Procedure: EXTRACORPOREAL SHOCK WAVE LITHOTRIPSY (ESWL);  Surgeon: Royston Cowper, MD;  Location: [facility] ORS;  Service: Urology;  Laterality: Left;  . EXTRACORPOREAL SHOCK WAVE LITHOTRIPSY Right 10/04/2016   Procedure: EXTRACORPOREAL SHOCK WAVE LITHOTRIPSY (ESWL);  Surgeon: Royston Cowper, MD;  Location: [facility] ORS;  Service: Urology;  Laterality: Right;   His family history includes Cirrhosis in his brother; Diabetes in his father and mother; Hypertension in his father; Pancreatic cancer in his father; Pulmonary embolism in his mother; Transient ischemic attack in his mother.     Current Outpatient Medications:  .  atorvastatin (LIPITOR) 40 MG tablet, Take 1 tablet by mouth daily., Disp: , Rfl:  .  co-enzyme Q-10 30 MG capsule, Take 30 mg by mouth 3 (three) times daily., Disp: , Rfl:  .  diclofenac sodium (VOLTAREN) 1 % GEL, Apply 2 g topically 4 (four) times daily., Disp: 100 g, Rfl: 3 .  finasteride (PROSCAR) 5 MG tablet, Take 5 mg by mouth every evening., Disp: , Rfl:  .  glucose blood test strip, Test fasting blood glucose each morning before breakfast., Disp: 100 each, Rfl: 4 .  Lancets (ONETOUCH ULTRASOFT) lancets, Test fasting glucose level each morning before breakfast., Disp: 100 each, Rfl: 4 .  meloxicam (MOBIC) 15 MG tablet, TAKE 1 TABLET BY MOUTH ONCE DAILY, Disp: 30 tablet, Rfl: 3 .  methocarbamol (ROBAXIN) 750 MG tablet, Take 1 tablet (750 mg total) by mouth 4 (four) times daily., Disp: 40 tablet, Rfl: 1 .  metoprolol (LOPRESSOR) 50 MG tablet, Take 50 mg by mouth 2 (two) times daily. Morning and night,  Disp: , Rfl:  .  MULTIPLE VITAMIN PO, Take 1 tablet by mouth daily., Disp: , Rfl:  .  nitroGLYCERIN (NITROSTAT) 0.4 MG SL tablet, Place 0.4 mg under the tongue every 5 (five) minutes as needed. Reported on 03/24/2015, Disp: , Rfl:  .  Omega-3 Fatty Acids (FISH OIL) 1200 MG CAPS, Take 1 capsule by mouth 2 (two) times daily., Disp: , Rfl:  .  omeprazole (PRILOSEC) 20 MG capsule, Take 20 mg by mouth daily. , Disp: , Rfl:  .  ramipril (ALTACE) 10 MG capsule, Take 1 capsule by mouth daily., Disp: , Rfl:  .  tamsulosin (FLOMAX) 0.4 MG CAPS capsule, Take 1 capsule (0.4 mg total) by mouth daily after breakfast., Disp: 14 capsule, Rfl: 0  Patient Care Team: Margo Common,  PA as PCP - General (Physician Assistant) Thelma Comp, [place] as Consulting Physician (Optometry) Royston Cowper, MD as Consulting Physician (Urology)    Objective:   Vitals: BP 118/60   Pulse (!) 54   Temp 98 F (36.7 C) (Oral)   Ht 5\' 8"  (1.727 m)   Wt 185 lb (83.9 kg)   BMI 28.13 kg/m   Physical Exam  Constitutional: He is oriented to person, place, and time. He appears well-developed and well-nourished.  HENT:  Head: Normocephalic and atraumatic.  Right Ear: External ear normal.  Left Ear: External ear normal.  Nose: Nose normal.  Mouth/Throat: Oropharynx is clear and moist.  Eyes: Pupils are equal, round, and reactive to light. Conjunctivae and EOM are normal. Right eye exhibits no discharge.  Neck: Normal range of motion. Neck supple. No tracheal deviation present. No thyromegaly present.  Cardiovascular: Normal rate, regular rhythm, normal heart sounds and intact distal pulses.  No murmur heard. Pulmonary/Chest: Effort normal and breath sounds normal. No respiratory distress. He has no wheezes. He has no rales. He exhibits no tenderness.  Abdominal: Soft. He exhibits no distension and no mass. There is no tenderness. There is no rebound and no guarding.  Musculoskeletal: Normal range of motion. He  exhibits no edema or tenderness.  Lymphadenopathy:    He has no cervical adenopathy.  Neurological: He is alert and oriented to person, place, and time. He has normal reflexes. He displays normal reflexes. No cranial nerve deficit. He exhibits normal muscle tone. Coordination normal.  Skin: Skin is warm and dry. No rash noted. No erythema.  Psychiatric: He has a normal mood and affect. His behavior is normal. Judgment and thought content normal.   Diabetic Foot Form - Detailed   Diabetic Foot Exam - detailed Diabetic Foot exam was performed with the following findings:  Yes 11/22/2017 11:40 AM  Visual Foot Exam completed.:  Yes  Can the patient see the bottom of their feet?:  Yes Are the shoes appropriate in style and fit?:  Yes Is there swelling or and abnormal foot shape?:  No Is there a claw toe deformity?:  No Is there elevated skin temparature?:  No Is there foot or ankle muscle weakness?:  No Normal Range of Motion:  Yes Pulse Foot Exam completed.:  Yes  Right posterior Tibialias:  Present Left posterior Tibialias:  Present  Right Dorsalis Pedis:  Present Left Dorsalis Pedis:  Present  Sensory Foot Exam Completed.:  Yes Semmes-Weinstein Monofilament Test R Site 1-Great Toe:  Pos L Site 1-Great Toe:  Pos        Activities of Daily Living In your present state of health, do you have any difficulty performing the following activities: 11/22/2017  Hearing? N  Vision? N  Difficulty concentrating or making decisions? N  Walking or climbing stairs? N  Dressing or bathing? N  Doing errands, shopping? N  Preparing Food and eating ? N  Using the Toilet? N  In the past six months, have you accidently leaked urine? N  Do you have problems with loss of bowel control? N  Managing your Medications? N  Managing your Finances? N  Housekeeping or managing your Housekeeping? N  Some recent data might be hidden   Fall Risk Asses Fall Risk  11/22/2017 08/08/2017 08/01/2017 07/25/2017  07/24/2017  Falls in the past year? No Yes Yes No No  Comment - no falls in past week - - -  Number falls in past yr: - - 1 - -  Injury with Fall? - - No - -  Comment - - fell back on railing on porch - slipped on water - -  Follow up - - Falls prevention discussed - -   Depression Screen PHQ 2/9 Scores 11/22/2017 07/24/2017 07/04/2017 10/30/2016  PHQ - 2 Score 0 0 0 0   Assessment & Plan:    Annual Physical Reviewed patient's Family Medical History Reviewed and updated list of patient's medical providers Assessment of cognitive impairment was done Assessed patient's functional ability Established a written schedule for health screening [place] Completed and Reviewed  Exercise Activities and Dietary recommendations Goals    . Increase water intake     Continue drinking 6-8 glasses of water a day. Pt declined diet or exercise change.       Immunization History  Administered Date(s) Administered  . Influenza Split 03/10/2010, 03/11/2012  . Influenza, High Dose Seasonal PF 01/07/2014, 10/31/2015, 10/30/2016, 11/22/2017  . Influenza,inj,Quad PF,6+ Mos 12/09/2012  . Pneumococcal Conjugate-13 01/07/2014  . Pneumococcal Polysaccharide-23 03/31/2015  . Tdap 09/23/2009  . Zoster 04/02/2011    Health Maintenance  Topic Date Due  . OPHTHALMOLOGY EXAM  01/03/1955  . HEMOGLOBIN A1C  12/27/2017  . FOOT EXAM  07/09/2018  . TETANUS/TDAP  09/24/2019  . COLONOSCOPY  06/07/2025  . INFLUENZA VACCINE  Completed  . Hepatitis C Screening  Completed  . PNA vac Low Risk Adult  Completed     Discussed health benefits of physical activity, and encouraged him to engage in regular exercise appropriate for his age and condition.    ------------------------------------------------------------------------------------------------------------ 1. Essential (primary) hypertension Well controlled and tolerating Metoprolol Tartrate 50 mg BID with Ramipril 10 mg qd. Get follow up  labs and recheck pending reports. - CBC with Differential/Platelet - Comprehensive metabolic panel - Lipid panel - TSH  2. H/O acute myocardial infarction History of MI with 2 stents placed in 2013. No recent chest pains or use of NTG. Followed by Dr. Clayborn Bigness (cardiologist) in June 2019 without new issues. Recheck labs and follow up pending reports. - CBC with Differential/Platelet - Comprehensive metabolic panel - Lipid panel  3. Mixed hyperlipidemia Tolerating the Lipitor 40 mg qd without side effects. Continues Omega-3 Fish Oil 1200 mg BID. Follow low fat diet and recheck labs. - CBC with Differential/Platelet - Comprehensive metabolic panel - Lipid panel - TSH  4. Personal history of kidney stones No recent hematuria or flank pain. No recurrence of stones since August 2018. Check renal function labs. - Comprehensive metabolic panel  5. Arthritis Aches and pains in lower back and hands is improved. Keeping hands warm controls discomfort. Methocarbamol 750 mg QID has helped relieve muscle spasms and Meloxicam 15 mg qd. Physical therapy in May 2019 helped a great deal for the lumbar radiculopathy. No numbness or radiating pain now. Apply moist heat to affected areas and recheck CBC with diff.  - CBC with Differential/Platelet  6. Diabetes mellitus without complication ([place]) Stable and well controlled. FBS in the 90-130 range at home. Last eye exam was Dec. 2018 after having bilateral cataract surgery. Normal foot exam. Controlling diabetes by diet and physical activities. Recheck labs and follow up pending reports. - CBC with Differential/Platelet - Comprehensive metabolic panel - Hemoglobin A1c - Lipid panel    Vernie Murders, PA  [facility]

## 2017-11-23 LAB — COMPREHENSIVE METABOLIC PANEL
ALK PHOS: 74 IU/L (ref 39–117)
ALT: 26 IU/L (ref 0–44)
AST: 24 IU/L (ref 0–40)
Albumin/Globulin Ratio: 1.7 (ref 1.2–2.2)
Albumin: 4.3 g/dL (ref 3.5–4.8)
BILIRUBIN TOTAL: 0.7 mg/dL (ref 0.0–1.2)
BUN/Creatinine Ratio: 15 (ref 10–24)
BUN: 21 mg/dL (ref 8–27)
CHLORIDE: 103 mmol/L (ref 96–106)
CO2: 19 mmol/L — ABNORMAL LOW (ref 20–29)
Calcium: 9.1 mg/dL (ref 8.6–10.2)
Creatinine, Ser: 1.42 mg/dL — ABNORMAL HIGH (ref 0.76–1.27)
GFR calc Af Amer: 57 mL/min/{1.73_m2} — ABNORMAL LOW (ref >59)
GFR calc non Af Amer: 49 mL/min/{1.73_m2} — ABNORMAL LOW (ref >59)
GLUCOSE: 85 mg/dL (ref 65–99)
Globulin, Total: 2.6 g/dL (ref 1.5–4.5)
POTASSIUM: 4.5 mmol/L (ref 3.5–5.2)
Sodium: 141 mmol/L (ref 134–144)
TOTAL PROTEIN: 6.9 g/dL (ref 6.0–8.5)

## 2017-11-23 LAB — CBC WITH DIFFERENTIAL/PLATELET
BASOS ABS: 0 10*3/uL (ref 0.0–0.2)
Basos: 1 %
EOS (ABSOLUTE): 0.3 10*3/uL (ref 0.0–0.4)
Eos: 5 %
Hematocrit: 44.4 % (ref 37.5–51.0)
Hemoglobin: 15.2 g/dL (ref 13.0–17.7)
IMMATURE GRANS (ABS): 0 10*3/uL (ref 0.0–0.1)
Immature Granulocytes: 0 %
LYMPHS: 41 %
Lymphocytes Absolute: 2.5 10*3/uL (ref 0.7–3.1)
MCH: 30.3 pg (ref 26.6–33.0)
MCHC: 34.2 g/dL (ref 31.5–35.7)
MCV: 88 fL (ref 79–97)
MONOS ABS: 0.6 10*3/uL (ref 0.1–0.9)
Monocytes: 10 %
NEUTROS ABS: 2.7 10*3/uL (ref 1.4–7.0)
NEUTROS PCT: 43 %
PLATELETS: 144 10*3/uL — AB (ref 150–450)
RBC: 5.02 x10E6/uL (ref 4.14–5.80)
RDW: 13.9 % (ref 12.3–15.4)
WBC: 6.1 10*3/uL (ref 3.4–10.8)

## 2017-11-23 LAB — LIPID PANEL
CHOLESTEROL TOTAL: 93 mg/dL — AB (ref 100–199)
Chol/HDL Ratio: 3.7 ratio (ref 0.0–5.0)
HDL: 25 mg/dL — ABNORMAL LOW (ref >39)
LDL Calculated: 39 mg/dL (ref 0–99)
TRIGLYCERIDES: 146 mg/dL (ref 0–149)
VLDL Cholesterol Cal: 29 mg/dL (ref 5–40)

## 2017-11-23 LAB — HEMOGLOBIN A1C
Est. average glucose Bld gHb Est-mCnc: 123 mg/dL
Hgb A1c MFr Bld: 5.9 % — ABNORMAL HIGH (ref 4.8–5.6)

## 2017-11-23 LAB — TSH: TSH: 4.35 u[IU]/mL (ref 0.450–4.500)

## 2017-11-25 ENCOUNTER — Telehealth: Payer: Self-pay

## 2017-11-25 NOTE — Telephone Encounter (Signed)
lmtcb

## 2017-11-25 NOTE — Telephone Encounter (Signed)
-----   Message from Margo Common, [place] sent at 11/24/2017  5:21 PM EDT ----- Hgb A1C better and should continue present diabetes treatment. Kidney function shows need for extra water in diet and should continue Ramipril daily. HDL cholesterol low but remainder of lipids in good shape. Continue switch Omega-3 Fish Oil to [employer] 1000 mg qd and continue Atorvastatin 40 mg qd. Recheck progress in 3-4 months.

## 2017-11-27 NOTE — Telephone Encounter (Signed)
Patient advised as directed below. 

## 2017-11-27 NOTE — Telephone Encounter (Signed)
LMTCB 11/27/2017   Thanks,   -Mickel Baas

## 2017-12-19 ENCOUNTER — Ambulatory Visit (INDEPENDENT_AMBULATORY_CARE_PROVIDER_SITE_OTHER): Payer: Medicare Other | Admitting: Family Medicine

## 2017-12-19 ENCOUNTER — Other Ambulatory Visit: Payer: Self-pay

## 2017-12-19 ENCOUNTER — Encounter: Payer: Self-pay | Admitting: Family Medicine

## 2017-12-19 VITALS — BP 178/98 | HR 51 | Temp 98.1°F | Ht 68.0 in | Wt 190.4 lb

## 2017-12-19 DIAGNOSIS — I10 Essential (primary) hypertension: Secondary | ICD-10-CM | POA: Diagnosis not present

## 2017-12-19 NOTE — Progress Notes (Signed)
Patient: Luke Rojas Male    DOB: 1946-01-22   72 y.o.   MRN: [id number] Visit Date: 12/19/2017  Today's Provider: Vernie Murders, PA   Chief Complaint  Patient presents with  . Hypertension   Subjective:    HPI  Pt reports blood pressure being elevated that started this morning 12/19/17.  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  Pt reports no chest pain, no SOB and that he feels pretty good.     Past Medical History:  Diagnosis Date  . Chronic kidney disease    kidney stones  . Coronary artery disease    2 stents   . Diabetes mellitus without complication ([place])   . GERD (gastroesophageal reflux disease)   . Hypercholesteremia   . Hypertension   . Myocardial infarction [facility]) 2013   Past Surgical History:  Procedure Laterality Date  . CARDIAC CATHETERIZATION  2013   2 stents  . COLONOSCOPY  04/27/05  . COLONOSCOPY WITH PROPOFOL N/A 06/08/2015   Procedure: COLONOSCOPY WITH PROPOFOL;  Surgeon: Robert Bellow, MD;  [facility];  Service: Endoscopy;  Laterality: N/A;  . CYSTOSCOPY WITH STENT PLACEMENT Right 09/16/2016   Procedure: CYSTOSCOPY WITH STENT PLACEMENT;  Surgeon: Royston Cowper, MD;  Location: [facility] ORS;  Service: Urology;  Laterality: Right;  . EXTRACORPOREAL SHOCK WAVE LITHOTRIPSY Left 11/04/2014   has had 2 previous lithotripsies  . EXTRACORPOREAL SHOCK WAVE LITHOTRIPSY Left 03/24/2015   Procedure: EXTRACORPOREAL SHOCK WAVE LITHOTRIPSY (ESWL);  Surgeon: Royston Cowper, MD;  Location: [facility] ORS;  Service: Urology;  Laterality: Left;  . EXTRACORPOREAL SHOCK WAVE LITHOTRIPSY Right 10/04/2016   Procedure: EXTRACORPOREAL SHOCK WAVE LITHOTRIPSY (ESWL);  Surgeon: Royston Cowper, MD;  Location: [facility] ORS;  Service: Urology;  Laterality: Right;   Family History  Problem Relation Age of Onset  . Pulmonary embolism Mother   . Transient ischemic attack Mother   . Diabetes Mother   . Pancreatic cancer  Father   . Hypertension Father   . Diabetes Father   . Cirrhosis Brother    Allergies  Allergen Reactions  . Codeine Nausea Only    Current Outpatient Medications:  .  atorvastatin (LIPITOR) 40 MG tablet, Take 1 tablet by mouth daily., Disp: , Rfl:  .  co-enzyme Q-10 30 MG capsule, Take 30 mg by mouth 3 (three) times daily., Disp: , Rfl:  .  diclofenac sodium (VOLTAREN) 1 % GEL, Apply 2 g topically 4 (four) times daily., Disp: 100 g, Rfl: 3 .  finasteride (PROSCAR) 5 MG tablet, Take 5 mg by mouth every evening., Disp: , Rfl:  .  glucose blood test strip, Test fasting blood glucose each morning before breakfast., Disp: 100 each, Rfl: 4 .  Lancets (ONETOUCH ULTRASOFT) lancets, Test fasting glucose level each morning before breakfast., Disp: 100 each, Rfl: 4 .  meloxicam (MOBIC) 15 MG tablet, TAKE 1 TABLET BY MOUTH ONCE DAILY, Disp: 30 tablet, Rfl: 3 .  methocarbamol (ROBAXIN) 750 MG tablet, Take 1 tablet (750 mg total) by mouth 4 (four) times daily., Disp: 40 tablet, Rfl: 1 .  metoprolol (LOPRESSOR) 50 MG tablet, Take 50 mg by mouth 2 (two) times daily. Morning and night, Disp: , Rfl:  .  MULTIPLE VITAMIN PO, Take 1 tablet by mouth daily., Disp: , Rfl:  .  nitroGLYCERIN (NITROSTAT) 0.4 MG SL tablet, Place 0.4 mg under the tongue every 5 (five) minutes as needed. Reported on 03/24/2015,  Disp: , Rfl:  .  Omega-3 Fatty Acids (FISH OIL) 1200 MG CAPS, Take 1 capsule by mouth 2 (two) times daily., Disp: , Rfl:  .  omeprazole (PRILOSEC) 20 MG capsule, Take 20 mg by mouth daily. , Disp: , Rfl:  .  ramipril (ALTACE) 10 MG capsule, Take 1 capsule by mouth daily., Disp: , Rfl:  .  tamsulosin (FLOMAX) 0.4 MG CAPS capsule, Take 1 capsule (0.4 mg total) by mouth daily after breakfast., Disp: 14 capsule, Rfl: 0  Review of Systems  Constitutional: Negative.   HENT: Negative.   Eyes: Negative.   Respiratory: Negative.   Cardiovascular: Negative.   Gastrointestinal: Negative.   Endocrine: Negative.     Genitourinary: Negative.   Musculoskeletal: Negative.   Skin: Negative.   Allergic/Immunologic: Negative.   Neurological: Negative.   Hematological: Negative.   Psychiatric/Behavioral: Negative.     Social History   Tobacco Use  . Smoking status: Former Smoker    Packs/day: 1.00    Years: 30.00    Pack years: 30.00    Types: Cigarettes    Last attempt to quit: 03/05/1984    Years since quitting: 33.8  . Smokeless tobacco: Current User    Types: Chew  Substance Use Topics  . Alcohol use: No    Alcohol/week: 0.0 standard drinks   Objective:   BP (!) 178/98 (BP Location: Right Arm, Patient Position: Sitting, Cuff Size: Normal)   Pulse (!) 51   Temp 98.1 F (36.7 C) (Oral)   Ht 5\' 8"  (1.727 m)   Wt 190 lb 6.4 oz (86.4 kg)   SpO2 98%   BMI 28.95 kg/m  Vitals:   12/19/17 1105  BP: (!) 178/98  Pulse: (!) 51  Temp: 98.1 F (36.7 C)  TempSrc: Oral  SpO2: 98%  Weight: 190 lb 6.4 oz (86.4 kg)  Height: 5\' 8"  (1.727 m)   Physical Exam  Constitutional: He is oriented to person, place, and time. He appears well-developed and well-nourished. No distress.  HENT:  Head: Normocephalic and atraumatic.  Right Ear: Hearing normal.  Left Ear: Hearing normal.  Nose: Nose normal.  Eyes: Conjunctivae and lids are normal. Right eye exhibits no discharge. Left eye exhibits no discharge. No scleral icterus.  Cardiovascular: Normal rate and regular rhythm.  Pulmonary/Chest: Effort normal and breath sounds normal. No respiratory distress.  Musculoskeletal: Normal range of motion. He exhibits no edema.  Neurological: He is alert and oriented to person, place, and time. No cranial nerve deficit.  Skin: Skin is intact. No lesion and no rash noted.  Psychiatric: He has a normal mood and affect. His speech is normal and behavior is normal. Thought content normal.      Assessment & Plan:     1. Essential (primary) hypertension BP elevated today without chest pains, palpitations, dyspnea,  edema, weakness or neurologic deficits. After calming down and sitting for 10-15 minutes, BP 160/84 LA and 168/82 RA. Admits diet has not been the best the past couple weeks because he has felt well. Reminded him to restrict salt intake and caffeine. Continues to take the Metoprolol 50 mg BID and Ramipril 10 mg qd. If BP >190/90, may take an extra 1/2 tablet of the Metoprolol and rest for an hour to see that his BP comes back down. If this is necessary frequently, will change dosage of medications. Follow up in January as planned.       Vernie Murders, PA  [facility]

## 2018-01-09 ENCOUNTER — Other Ambulatory Visit: Payer: Self-pay

## 2018-01-09 ENCOUNTER — Encounter: Payer: Self-pay | Admitting: Family Medicine

## 2018-01-09 ENCOUNTER — Ambulatory Visit (INDEPENDENT_AMBULATORY_CARE_PROVIDER_SITE_OTHER): Payer: Medicare Other | Admitting: Family Medicine

## 2018-01-09 VITALS — BP 130/82 | HR 56 | Temp 97.9°F | Ht 68.0 in | Wt 188.4 lb

## 2018-01-09 DIAGNOSIS — I10 Essential (primary) hypertension: Secondary | ICD-10-CM | POA: Diagnosis not present

## 2018-01-09 DIAGNOSIS — E119 Type 2 diabetes mellitus without complications: Secondary | ICD-10-CM

## 2018-01-09 MED ORDER — METOPROLOL TARTRATE 50 MG PO TABS: ORAL_TABLET | ORAL | 3 refills | Status: DC

## 2018-01-09 NOTE — Progress Notes (Signed)
Patient: Luke Rojas Male    DOB: December 16, 1945   72 y.o.   MRN: [id number] Visit Date: 01/09/2018  Today's Provider: Vernie Murders, PA   Chief Complaint  Patient presents with  . Follow-up    bp and bs   Subjective:    HPI  Pt reports he is being seen for a f-up for blood pressure and blood sugar.     Past Medical History:  Diagnosis Date  . Chronic kidney disease    kidney stones  . Coronary artery disease    2 stents   . Diabetes mellitus without complication ([place])   . GERD (gastroesophageal reflux disease)   . Hypercholesteremia   . Hypertension   . Myocardial infarction [facility]) 2013   Past Surgical History:  Procedure Laterality Date  . CARDIAC CATHETERIZATION  2013   2 stents  . COLONOSCOPY  04/27/05  . COLONOSCOPY WITH PROPOFOL N/A 06/08/2015   Procedure: COLONOSCOPY WITH PROPOFOL;  Surgeon: Robert Bellow, MD;  Location: [facility] ENDOSCOPY;  Service: Endoscopy;  Laterality: N/A;  . CYSTOSCOPY WITH STENT PLACEMENT Right 09/16/2016   Procedure: CYSTOSCOPY WITH STENT PLACEMENT;  Surgeon: Royston Cowper, MD;  Location: [facility] ORS;  Service: Urology;  Laterality: Right;  . EXTRACORPOREAL SHOCK WAVE LITHOTRIPSY Left 11/04/2014   has had 2 previous lithotripsies  . EXTRACORPOREAL SHOCK WAVE LITHOTRIPSY Left 03/24/2015   Procedure: EXTRACORPOREAL SHOCK WAVE LITHOTRIPSY (ESWL);  Surgeon: Royston Cowper, MD;  Location: [facility] ORS;  Service: Urology;  Laterality: Left;  . EXTRACORPOREAL SHOCK WAVE LITHOTRIPSY Right 10/04/2016   Procedure: EXTRACORPOREAL SHOCK WAVE LITHOTRIPSY (ESWL);  Surgeon: Royston Cowper, MD;  Location: [facility] ORS;  Service: Urology;  Laterality: Right;   Family History  Problem Relation Age of Onset  . Pulmonary embolism Mother   . Transient ischemic attack Mother   . Diabetes Mother   . Pancreatic cancer Father   . Hypertension Father   . Diabetes Father   . Cirrhosis Brother    Allergies  Allergen Reactions  . Codeine Nausea Only     Current Outpatient Medications:  .  atorvastatin (LIPITOR) 40 MG tablet, Take 1 tablet by mouth daily., Disp: , Rfl:  .  co-enzyme Q-10 30 MG capsule, Take 30 mg by mouth 3 (three) times daily., Disp: , Rfl:  .  diclofenac sodium (VOLTAREN) 1 % GEL, Apply 2 g topically 4 (four) times daily., Disp: 100 g, Rfl: 3 .  finasteride (PROSCAR) 5 MG tablet, Take 5 mg by mouth every evening., Disp: , Rfl:  .  glucose blood test strip, Test fasting blood glucose each morning before breakfast., Disp: 100 each, Rfl: 4 .  Lancets (ONETOUCH ULTRASOFT) lancets, Test fasting glucose level each morning before breakfast., Disp: 100 each, Rfl: 4 .  meloxicam (MOBIC) 15 MG tablet, TAKE 1 TABLET BY MOUTH ONCE DAILY, Disp: 30 tablet, Rfl: 3 .  methocarbamol (ROBAXIN) 750 MG tablet, Take 1 tablet (750 mg total) by mouth 4 (four) times daily., Disp: 40 tablet, Rfl: 1 .  metoprolol (LOPRESSOR) 50 MG tablet, Take 50 mg by mouth 2 (two) times daily. Morning and night, Disp: , Rfl:  .  MULTIPLE VITAMIN PO, Take 1 tablet by mouth daily., Disp: , Rfl:  .  nitroGLYCERIN (NITROSTAT) 0.4 MG SL tablet, Place 0.4 mg under the tongue every 5 (five) minutes as needed. Reported on 03/24/2015, Disp: , Rfl:  .  Omega-3 Fatty Acids (FISH OIL) 1200 MG CAPS, Take 1 capsule by mouth  2 (two) times daily., Disp: , Rfl:  .  ramipril (ALTACE) 10 MG capsule, Take 1 capsule by mouth daily., Disp: , Rfl:  .  tamsulosin (FLOMAX) 0.4 MG CAPS capsule, Take 1 capsule (0.4 mg total) by mouth daily after breakfast., Disp: 14 capsule, Rfl: 0 .  omeprazole (PRILOSEC) 20 MG capsule, Take 20 mg by mouth daily. , Disp: , Rfl:   Review of Systems  Constitutional: Negative.   HENT: Negative.   Eyes: Negative.   Respiratory: Negative.   Cardiovascular: Negative.   Gastrointestinal: Negative.   Endocrine: Negative.   Genitourinary: Negative.   Musculoskeletal: Negative.   Skin: Negative.   Allergic/Immunologic: Negative.   Neurological: Negative.    Hematological: Negative.   Psychiatric/Behavioral: Negative.    Social History   Tobacco Use  . Smoking status: Former Smoker    Packs/day: 1.00    Years: 30.00    Pack years: 30.00    Types: Cigarettes    Last attempt to quit: 03/05/1984    Years since quitting: 33.8  . Smokeless tobacco: Current User    Types: Chew  Substance Use Topics  . Alcohol use: No    Alcohol/week: 0.0 standard drinks   Objective:   BP 130/82 (BP Location: Right Arm, Patient Position: Sitting, Cuff Size: Normal)   Pulse (!) 56   Temp 97.9 F (36.6 C) (Oral)   Ht 5\' 8"  (1.727 m)   Wt 188 lb 6.4 oz (85.5 kg)   SpO2 97%   BMI 28.65 kg/m   BP Readings from Last 3 Encounters:  01/09/18 130/82  12/19/17 (!) 178/98  11/22/17 118/60   Wt Readings from Last 3 Encounters:  01/09/18 188 lb 6.4 oz (85.5 kg)  12/19/17 190 lb 6.4 oz (86.4 kg)  11/22/17 185 lb (83.9 kg)   Vitals:   01/09/18 0925  BP: 130/82  Pulse: (!) 56  Temp: 97.9 F (36.6 C)  TempSrc: Oral  SpO2: 97%  Weight: 188 lb 6.4 oz (85.5 kg)  Height: 5\' 8"  (1.727 m)   Physical Exam  Constitutional: He is oriented to person, place, and time. He appears well-developed and well-nourished. No distress.  HENT:  Head: Normocephalic and atraumatic.  Right Ear: Hearing normal.  Left Ear: Hearing normal.  Nose: Nose normal.  Eyes: Conjunctivae and lids are normal. Right eye exhibits no discharge. Left eye exhibits no discharge. No scleral icterus.  Cardiovascular: Normal rate and regular rhythm.  Pulmonary/Chest: Effort normal and breath sounds normal. No respiratory distress.  Neurological: He is alert and oriented to person, place, and time.  Skin: Skin is intact. No lesion and no rash noted.  Psychiatric: He has a normal mood and affect. His speech is normal and behavior is normal. Thought content normal.      Assessment & Plan:     1. Essential (primary) hypertension Much improved BP with increase in Metoprolol to 1.5 tablet q am  and 1 tablet q pm of the 50 mg and Ramipril 10 mg qd. No dyspnea, dizziness, chest pains, palpitations or peripheral edema. Continue to restrict sodium intake and caffeine. States he still gets high BP readings at home. Recommend he bring his BP cuff to the office to check calibration. - metoprolol tartrate (LOPRESSOR) 50 MG tablet; 1.5 tablet by mouth each morning and 1 tablet at night  Dispense: 75 tablet; Refill: 3  2. Diabetes mellitus without complication (HCC) FBS 94 this morning. No polyuria, polydipsia, vision changes or peripheral neuropathy. Continue diet and exercise. No need  for additional medication. Recheck in 2-3 months.       Vernie Murders, PA  [facility]

## 2018-02-03 ENCOUNTER — Encounter: Payer: Self-pay | Admitting: Family Medicine

## 2018-02-03 ENCOUNTER — Ambulatory Visit (INDEPENDENT_AMBULATORY_CARE_PROVIDER_SITE_OTHER): Payer: Medicare Other | Admitting: Family Medicine

## 2018-02-03 VITALS — BP 147/68 | HR 50 | Temp 97.7°F | Resp 16 | Wt 189.0 lb

## 2018-02-03 DIAGNOSIS — I10 Essential (primary) hypertension: Secondary | ICD-10-CM

## 2018-02-03 DIAGNOSIS — E782 Mixed hyperlipidemia: Secondary | ICD-10-CM

## 2018-02-03 DIAGNOSIS — I2510 Atherosclerotic heart disease of native coronary artery without angina pectoris: Secondary | ICD-10-CM | POA: Diagnosis not present

## 2018-02-03 DIAGNOSIS — E119 Type 2 diabetes mellitus without complications: Secondary | ICD-10-CM

## 2018-02-03 LAB — POCT GLUCOSE (DEVICE FOR HOME USE): POC Glucose: 150 mg/dl — AB (ref 70–99)

## 2018-02-03 NOTE — Progress Notes (Signed)
Patient: Luke Rojas Male    DOB: 1945-11-02   72 y.o.   MRN: [id number] Visit Date: 02/03/2018  Today's Provider: Vernie Murders, PA   No chief complaint on file.  Subjective:    HPI   Diabetes Mellitus Type II, Follow-up:   Lab Results  Component Value Date   HGBA1C 5.9 (H) 11/22/2017   HGBA1C 6.7 (H) 06/27/2017   Last seen for diabetes 01/09/2018.  Management since then includes; no changes. He reports good compliance with treatment. He is not having side effects. none Current symptoms include none and have been . Home blood sugar records:   Episodes of hypoglycemia? no   Current Insulin Regimen: n/a Most Recent Eye Exam: **due* Weight trend: stable Prior visit with dietician: no Current diet: in general, a "healthy" diet   Current exercise: no regular exercise  --------------------------------------------------------------   Hypertension, follow-up:  BP Readings from Last 3 Encounters:  02/03/18 (!) 147/68  01/09/18 130/82  12/19/17 (!) 178/98    He was last seen for hypertension 1 months ago.  BP at that visit was 130/82. Management since that visit includes;increased Metoprolol to 1.5 tablet q am and 1 tablet q pm of the 50 mg and Ramipril 10 mg qd .He reports good compliance with treatment. He is not having side effects. none He is not exercising. He is adherent to low salt diet.   Outside blood pressures are . He is experiencing none.  Patient denies none.   Cardiovascular risk factors include diabetes mellitus.  Use of agents associated with hypertension: none.   ---------------------------------------------------------------    Allergies  Allergen Reactions  . Codeine Nausea Only     Current Outpatient Medications:  .  atorvastatin (LIPITOR) 40 MG tablet, Take 1 tablet by mouth daily., Disp: , Rfl:  .  co-enzyme Q-10 30 MG capsule, Take 30 mg by mouth 3 (three) times daily., Disp: , Rfl:  .  diclofenac sodium (VOLTAREN)  1 % GEL, Apply 2 g topically 4 (four) times daily., Disp: 100 g, Rfl: 3 .  finasteride (PROSCAR) 5 MG tablet, Take 5 mg by mouth every evening., Disp: , Rfl:  .  glucose blood test strip, Test fasting blood glucose each morning before breakfast., Disp: 100 each, Rfl: 4 .  Lancets (ONETOUCH ULTRASOFT) lancets, Test fasting glucose level each morning before breakfast., Disp: 100 each, Rfl: 4 .  meloxicam (MOBIC) 15 MG tablet, TAKE 1 TABLET BY MOUTH ONCE DAILY, Disp: 30 tablet, Rfl: 3 .  methocarbamol (ROBAXIN) 750 MG tablet, Take 1 tablet (750 mg total) by mouth 4 (four) times daily., Disp: 40 tablet, Rfl: 1 .  metoprolol tartrate (LOPRESSOR) 50 MG tablet, 1.5 tablet by mouth each morning and 1 tablet at night, Disp: 75 tablet, Rfl: 3 .  MULTIPLE VITAMIN PO, Take 1 tablet by mouth daily., Disp: , Rfl:  .  nitroGLYCERIN (NITROSTAT) 0.4 MG SL tablet, Place 0.4 mg under the tongue every 5 (five) minutes as needed. Reported on 03/24/2015, Disp: , Rfl:  .  Omega-3 Fatty Acids (FISH OIL) 1200 MG CAPS, Take 1 capsule by mouth 2 (two) times daily., Disp: , Rfl:  .  omeprazole (PRILOSEC) 20 MG capsule, Take 20 mg by mouth daily. , Disp: , Rfl:  .  ramipril (ALTACE) 10 MG capsule, Take 1 capsule by mouth daily., Disp: , Rfl:  .  tamsulosin (FLOMAX) 0.4 MG CAPS capsule, Take 1 capsule (0.4 mg total) by mouth daily after breakfast., Disp: 14 capsule,  Rfl: 0  Review of Systems  Constitutional: Negative for appetite change, chills and fever.  Respiratory: Negative for chest tightness, shortness of breath and wheezing.   Cardiovascular: Negative for chest pain and palpitations.  Gastrointestinal: Negative for abdominal pain, nausea and vomiting.    Social History   Tobacco Use  . Smoking status: Former Smoker    Packs/day: 1.00    Years: 30.00    Pack years: 30.00    Types: Cigarettes    Last attempt to quit: 03/05/1984    Years since quitting: 33.9  . Smokeless tobacco: Current User    Types: Chew    Substance Use Topics  . Alcohol use: No    Alcohol/week: 0.0 standard drinks   Objective:   BP (!) 147/68 (BP Location: Right Arm, Patient Position: Sitting, Cuff Size: Large)   Pulse (!) 50   Temp 97.7 F (36.5 C) (Oral)   Resp 16   Wt 189 lb (85.7 kg)   SpO2 93%   BMI 28.74 kg/m  Vitals:   02/03/18 0930  BP: (!) 147/68  Pulse: (!) 50  Resp: 16  Temp: 97.7 F (36.5 C)  TempSrc: Oral  SpO2: 93%  Weight: 189 lb (85.7 kg)   Physical Exam  Constitutional: He is oriented to person, place, and time. He appears well-developed and well-nourished. No distress.  HENT:  Head: Normocephalic and atraumatic.  Right Ear: Hearing normal.  Left Ear: Hearing normal.  Nose: Nose normal.  Eyes: Conjunctivae and lids are normal. Right eye exhibits no discharge. Left eye exhibits no discharge. No scleral icterus.  Neck: Neck supple.  Cardiovascular: Normal rate and regular rhythm.  Pulmonary/Chest: Effort normal and breath sounds normal. No respiratory distress.  Abdominal: Soft. Bowel sounds are normal.  Musculoskeletal: Normal range of motion.  Neurological: He is alert and oriented to person, place, and time.  Skin: Skin is intact. No lesion and no rash noted.  Psychiatric: He has a normal mood and affect. His speech is normal and behavior is normal. Thought content normal.       Assessment & Plan:     1. Diabetes mellitus without complication ([place]) Blood sugar at home difficult to get readings until he got a new bottle of test strips. RBS 150 with office equipment and 147 with his OneTouch Verio glucometer today. No polyuria, polydipsia, polyphagia or vision changes. Continues to work on diet control. Will check labs in the next week or two. Follow up pending lab reports. - POCT Glucose (Device for Home Use) - CBC with Differential/Platelet - Comprehensive metabolic panel - Lipid panel - Hemoglobin A1c  2. Essential hypertension Tolerating Metoprolol Tartrate 75 mg q am and 50  mg q pm with Altace 10 mg qd. BP stable and had follow up with Dr. Clayborn Bigness (cardiologist) last week on 01-28-18.  - CBC with Differential/Platelet - Comprehensive metabolic panel - TSH  3. Mixed hyperlipidemia Tolerating Atorvastatin 40 mg qd without myalgias or fatigue issues. Continue walking 45 minutes 3-4 days a week and fat reduction diet. Recheck CMP, Lipid Panel and TSH. - Comprehensive metabolic panel - Lipid panel - TSH  4. Coronary artery disease involving native coronary artery of native heart without angina pectoris Had follow up with Dr. Clayborn Bigness (cardiologist) on 01-28-18 with history oif PCI and DES in the RCA and circumflex on Feb. 2012. Scheduled for stress test on 02-12-18. Will check routine labs and follow up pending lab reports. Denies chest pains, palpitations or dyspnea. - CBC with Differential/Platelet -  Comprehensive metabolic panel - Lipid panel - TSH       Vernie Murders, PA  [facility]

## 2018-02-06 ENCOUNTER — Telehealth: Payer: Self-pay | Admitting: *Deleted

## 2018-02-06 LAB — COMPREHENSIVE METABOLIC PANEL
A/G RATIO: 1.8 (ref 1.2–2.2)
ALT: 24 IU/L (ref 0–44)
AST: 25 IU/L (ref 0–40)
Albumin: 4.4 g/dL (ref 3.5–4.8)
Alkaline Phosphatase: 72 IU/L (ref 39–117)
BILIRUBIN TOTAL: 0.6 mg/dL (ref 0.0–1.2)
BUN/Creatinine Ratio: 18 (ref 10–24)
BUN: 24 mg/dL (ref 8–27)
CHLORIDE: 102 mmol/L (ref 96–106)
CO2: 21 mmol/L (ref 20–29)
Calcium: 9.5 mg/dL (ref 8.6–10.2)
Creatinine, Ser: 1.33 mg/dL — ABNORMAL HIGH (ref 0.76–1.27)
GFR calc Af Amer: 61 mL/min/{1.73_m2} (ref >59)
GFR calc non Af Amer: 53 mL/min/{1.73_m2} — ABNORMAL LOW (ref >59)
GLOBULIN, TOTAL: 2.5 g/dL (ref 1.5–4.5)
GLUCOSE: 102 mg/dL — AB (ref 65–99)
POTASSIUM: 4.6 mmol/L (ref 3.5–5.2)
SODIUM: 139 mmol/L (ref 134–144)
Total Protein: 6.9 g/dL (ref 6.0–8.5)

## 2018-02-06 LAB — CBC WITH DIFFERENTIAL/PLATELET
Basophils Absolute: 0 10*3/uL (ref 0.0–0.2)
Basos: 1 %
EOS (ABSOLUTE): 0.4 10*3/uL (ref 0.0–0.4)
Eos: 5 %
HEMOGLOBIN: 15.8 g/dL (ref 13.0–17.7)
Hematocrit: 47.4 % (ref 37.5–51.0)
Immature Grans (Abs): 0 10*3/uL (ref 0.0–0.1)
Immature Granulocytes: 0 %
Lymphocytes Absolute: 2.3 10*3/uL (ref 0.7–3.1)
Lymphs: 33 %
MCH: 29.8 pg (ref 26.6–33.0)
MCHC: 33.3 g/dL (ref 31.5–35.7)
MCV: 89 fL (ref 79–97)
MONOS ABS: 0.6 10*3/uL (ref 0.1–0.9)
Monocytes: 9 %
NEUTROS ABS: 3.6 10*3/uL (ref 1.4–7.0)
Neutrophils: 52 %
PLATELETS: 162 10*3/uL (ref 150–450)
RBC: 5.31 x10E6/uL (ref 4.14–5.80)
RDW: 13.9 % (ref 12.3–15.4)
WBC: 7 10*3/uL (ref 3.4–10.8)

## 2018-02-06 LAB — TSH: TSH: 6.19 u[IU]/mL — ABNORMAL HIGH (ref 0.450–4.500)

## 2018-02-06 LAB — HEMOGLOBIN A1C
Est. average glucose Bld gHb Est-mCnc: 120 mg/dL
HEMOGLOBIN A1C: 5.8 % — AB (ref 4.8–5.6)

## 2018-02-06 LAB — LIPID PANEL
Chol/HDL Ratio: 3.9 ratio (ref 0.0–5.0)
Cholesterol, Total: 117 mg/dL (ref 100–199)
HDL: 30 mg/dL — AB (ref >39)
LDL Calculated: 59 mg/dL (ref 0–99)
TRIGLYCERIDES: 141 mg/dL (ref 0–149)
VLDL Cholesterol Cal: 28 mg/dL (ref 5–40)

## 2018-02-06 NOTE — Telephone Encounter (Signed)
Patient returned your call. KW

## 2018-02-06 NOTE — Telephone Encounter (Signed)
Patient advised.KW 

## 2018-02-06 NOTE — Telephone Encounter (Signed)
LMOVM for pt to return call 

## 2018-02-06 NOTE — Telephone Encounter (Signed)
-----   Message from [employer], [place] sent at 02/06/2018  8:16 AM EST ----- Sugar control in good shape with Hgb A1C below 6.0. Don't need to check blood sugar more than once each morning now. Kidney function and HDL slowly improving. Continue present medications and recheck in 3 months.

## 2018-03-24 ENCOUNTER — Other Ambulatory Visit: Payer: Self-pay | Admitting: Family Medicine

## 2018-03-24 DIAGNOSIS — M47819 Spondylosis without myelopathy or radiculopathy, site unspecified: Secondary | ICD-10-CM

## 2018-03-28 NOTE — Progress Notes (Signed)
Patient: Luke Rojas Male    DOB: Jul 10, 1945   73 y.o.   MRN: [id number] Visit Date: 03/31/2018  Today's Provider: Vernie Murders, PA   Chief Complaint  Patient presents with  . Follow-up    HTN and HDL   Subjective:     HPI Patient was seen by his Cardiologist at [facility] and was prescribed Amlodipine and Hydrochlorothiazide. He is to follow up February 6,2020.  Lipid/Cholesterol, Follow-up:   Last seen for this1 months ago.  Management changes since that visit include none. . Last Lipid Panel:    Component Value Date/Time   CHOL 117 02/05/2018 0829   TRIG 141 02/05/2018 0829   HDL 30 (L) 02/05/2018 0829   CHOLHDL 3.9 02/05/2018 0829   LDLCALC 59 02/05/2018 0829    Risk factors for vascular disease include hypercholesterolemia and hypertension  He reports excellent compliance with treatment. He is not having side effects.  Current symptoms include none and have been stable. Current exercise: walking  Wt Readings from Last 3 Encounters:  03/31/18 187 lb (84.8 kg)  02/03/18 189 lb (85.7 kg)  01/09/18 188 lb 6.4 oz (85.5 kg)    -------------------------------------------------------------------  Diabetes Mellitus Type II, Follow-up:   Lab Results  Component Value Date   HGBA1C 5.8 (H) 02/05/2018   HGBA1C 5.9 (H) 11/22/2017   HGBA1C 6.7 (H) 06/27/2017    Last seen for diabetes 1 months ago.  Management since then includes none. Current symptoms include none and have been stable. Home blood sugar records: fasting range: 100-90  Episodes of hypoglycemia? no   Pertinent Labs:    Component Value Date/Time   CHOL 117 02/05/2018 0829   TRIG 141 02/05/2018 0829   HDL 30 (L) 02/05/2018 0829   LDLCALC 59 02/05/2018 0829   CREATININE 1.33 (H) 02/05/2018 0829   CREATININE 1.88 (H) 10/02/2011 1051   Past Medical History:  Diagnosis Date  . Chronic kidney disease    kidney stones  . Coronary artery disease    2 stents   . Diabetes  mellitus without complication ([place])   . GERD (gastroesophageal reflux disease)   . Hypercholesteremia   . Hypertension   . Myocardial infarction [facility]) 2013   Past Surgical History:  Procedure Laterality Date  . CARDIAC CATHETERIZATION  2013   2 stents  . COLONOSCOPY  04/27/05  . COLONOSCOPY WITH PROPOFOL N/A 06/08/2015   Procedure: COLONOSCOPY WITH PROPOFOL;  Surgeon: Robert Bellow, MD;  Location: [facility] ENDOSCOPY;  Service: Endoscopy;  Laterality: N/A;  . CYSTOSCOPY WITH STENT PLACEMENT Right 09/16/2016   Procedure: CYSTOSCOPY WITH STENT PLACEMENT;  Surgeon: Royston Cowper, MD;  Location: [facility] ORS;  Service: Urology;  Laterality: Right;  . EXTRACORPOREAL SHOCK WAVE LITHOTRIPSY Left 11/04/2014   has had 2 previous lithotripsies  . EXTRACORPOREAL SHOCK WAVE LITHOTRIPSY Left 03/24/2015   Procedure: EXTRACORPOREAL SHOCK WAVE LITHOTRIPSY (ESWL);  Surgeon: Royston Cowper, MD;  Location: [facility] ORS;  Service: Urology;  Laterality: Left;  . EXTRACORPOREAL SHOCK WAVE LITHOTRIPSY Right 10/04/2016   Procedure: EXTRACORPOREAL SHOCK WAVE LITHOTRIPSY (ESWL);  Surgeon: Royston Cowper, MD;  Location: [facility] ORS;  Service: Urology;  Laterality: Right;   Family History  Problem Relation Age of Onset  . Pulmonary embolism Mother   . Transient ischemic attack Mother   . Diabetes Mother   . Pancreatic cancer Father   . Hypertension Father   . Diabetes Father   . Cirrhosis Brother    Allergies  Allergen  Reactions  . Codeine Nausea Only    Current Outpatient Medications:  .  amLODipine (NORVASC) 5 MG tablet, Take by mouth., Disp: , Rfl:  .  atorvastatin (LIPITOR) 40 MG tablet, Take 1 tablet by mouth daily., Disp: , Rfl:  .  diclofenac sodium (VOLTAREN) 1 % GEL, Apply 2 g topically 4 (four) times daily., Disp: 100 g, Rfl: 3 .  finasteride (PROSCAR) 5 MG tablet, Take 5 mg by mouth every evening., Disp: , Rfl:  .  glucose blood test strip, Test fasting blood glucose each morning before breakfast., Disp:  100 each, Rfl: 4 .  hydrochlorothiazide (MICROZIDE) 12.5 MG capsule, , Disp: , Rfl:  .  Lancets (ONETOUCH ULTRASOFT) lancets, Test fasting glucose level each morning before breakfast., Disp: 100 each, Rfl: 4 .  meloxicam (MOBIC) 15 MG tablet, TAKE 1 TABLET BY MOUTH ONCE DAILY, Disp: 30 tablet, Rfl: 3 .  metoprolol tartrate (LOPRESSOR) 50 MG tablet, 1.5 tablet by mouth each morning and 1 tablet at night, Disp: 75 tablet, Rfl: 3 .  MULTIPLE VITAMIN PO, Take 1 tablet by mouth daily., Disp: , Rfl:  .  nitroGLYCERIN (NITROSTAT) 0.4 MG SL tablet, Place 0.4 mg under the tongue every 5 (five) minutes as needed. Reported on 03/24/2015, Disp: , Rfl:  .  Omega-3 Fatty Acids (FISH OIL) 1200 MG CAPS, Take 1 capsule by mouth 2 (two) times daily., Disp: , Rfl:  .  omeprazole (PRILOSEC) 20 MG capsule, Take 20 mg by mouth daily. , Disp: , Rfl:  .  ramipril (ALTACE) 10 MG capsule, Take 1 capsule by mouth daily., Disp: , Rfl:  .  tamsulosin (FLOMAX) 0.4 MG CAPS capsule, Take 1 capsule (0.4 mg total) by mouth daily after breakfast., Disp: 14 capsule, Rfl: 0 .  co-enzyme Q-10 30 MG capsule, Take 30 mg by mouth 3 (three) times daily., Disp: , Rfl:  .  methocarbamol (ROBAXIN) 750 MG tablet, Take 1 tablet (750 mg total) by mouth 4 (four) times daily., Disp: 40 tablet, Rfl: 1  Review of Systems  Social History   Tobacco Use  . Smoking status: Former Smoker    Packs/day: 1.00    Years: 30.00    Pack years: 30.00    Types: Cigarettes    Last attempt to quit: 03/05/1984    Years since quitting: 34.0  . Smokeless tobacco: Current User    Types: Chew  Substance Use Topics  . Alcohol use: No    Alcohol/week: 0.0 standard drinks     Objective:   BP 140/62 (BP Location: Right Arm, Patient Position: Sitting, Cuff Size: Large)   Pulse (!) 52   Temp 98 F (36.7 C) (Oral)   Resp 16   Wt 187 lb (84.8 kg)   SpO2 97%   BMI 28.43 kg/m  Vitals:   03/31/18 0815  BP: 140/62  Pulse: (!) 52  Resp: 16  Temp: 98 F  (36.7 C)  TempSrc: Oral  SpO2: 97%  Weight: 187 lb (84.8 kg)   Physical Exam Constitutional:      General: He is not in acute distress.    Appearance: He is well-developed.  HENT:     Head: Normocephalic and atraumatic.     Right Ear: Hearing normal.     Left Ear: Hearing normal.     Nose: Nose normal.  Eyes:     General: Lids are normal. No scleral icterus.       Right eye: No discharge.        Left  eye: No discharge.     Conjunctiva/sclera: Conjunctivae normal.  Neck:     Musculoskeletal: Neck supple.  Cardiovascular:     Rate and Rhythm: Normal rate and regular rhythm.     Heart sounds: Normal heart sounds.  Pulmonary:     Effort: Pulmonary effort is normal. No respiratory distress.  Abdominal:     General: Bowel sounds are normal.     Palpations: Abdomen is soft.  Musculoskeletal: Normal range of motion.  Skin:    Findings: No lesion or rash.  Neurological:     Mental Status: He is alert and oriented to person, place, and time.  Psychiatric:        Speech: Speech normal.        Behavior: Behavior normal.        Thought Content: Thought content normal.       Assessment & Plan    1. Essential (primary) hypertension Had systolic hypertension treated by cardiologist (Dr. Clayborn Bigness) with addition of Amlodipine 5 mg qd and HCTZ 12.5 mg qd to the Ramipril 10 mg qd and Metoprolol Tartrate 50 mg 1.5 tablet each morning and 1 tablet each night. Feeling well without dizziness, chest pains, edema, palpitations or dyspnea. BP down to 140/62 today. Plan follow up of labs in 3 months. Recent Results (from the past 2160 hour(s))  POCT Glucose (Device for Home Use)     Status: Abnormal   Collection Time: 02/03/18  9:51 AM  Result Value Ref Range   Glucose Fasting, POC     POC Glucose 150 (A) 70 - 99 mg/dl  CBC with Differential/Platelet     Status: None   Collection Time: 02/05/18  8:29 AM  Result Value Ref Range   WBC 7.0 3.4 - 10.8 x10E3/uL   RBC 5.31 4.14 - 5.80 x10E6/uL     Hemoglobin 15.8 13.0 - 17.7 g/dL   Hematocrit 47.4 37.5 - 51.0 %   MCV 89 79 - 97 fL   MCH 29.8 26.6 - 33.0 pg   MCHC 33.3 31.5 - 35.7 g/dL   RDW 13.9 12.3 - 15.4 %   Platelets 162 150 - 450 x10E3/uL   Neutrophils 52 Not Estab. %   Lymphs 33 Not Estab. %   Monocytes 9 Not Estab. %   Eos 5 Not Estab. %   Basos 1 Not Estab. %   Neutrophils Absolute 3.6 1.4 - 7.0 x10E3/uL   Lymphocytes Absolute 2.3 0.7 - 3.1 x10E3/uL   Monocytes Absolute 0.6 0.1 - 0.9 x10E3/uL   EOS (ABSOLUTE) 0.4 0.0 - 0.4 x10E3/uL   Basophils Absolute 0.0 0.0 - 0.2 x10E3/uL   Immature Granulocytes 0 Not Estab. %   Immature Grans (Abs) 0.0 0.0 - 0.1 x10E3/uL  Comprehensive metabolic panel     Status: Abnormal   Collection Time: 02/05/18  8:29 AM  Result Value Ref Range   Glucose 102 (H) 65 - 99 mg/dL   BUN 24 8 - 27 mg/dL   Creatinine, Ser 1.33 (H) 0.76 - 1.27 mg/dL   GFR calc non Af Amer 53 (L) >59 mL/min/1.73   GFR calc Af Amer 61 >59 mL/min/1.73   BUN/Creatinine Ratio 18 10 - 24   Sodium 139 134 - 144 mmol/L   Potassium 4.6 3.5 - 5.2 mmol/L   Chloride 102 96 - 106 mmol/L   CO2 21 20 - 29 mmol/L   Calcium 9.5 8.6 - 10.2 mg/dL   Total Protein 6.9 6.0 - 8.5 g/dL   Albumin 4.4 3.5 -  4.8 g/dL   Globulin, Total 2.5 1.5 - 4.5 g/dL   Albumin/Globulin Ratio 1.8 1.2 - 2.2   Bilirubin Total 0.6 0.0 - 1.2 mg/dL   Alkaline Phosphatase 72 39 - 117 IU/L   AST 25 0 - 40 IU/L   ALT 24 0 - 44 IU/L  Lipid panel     Status: Abnormal   Collection Time: 02/05/18  8:29 AM  Result Value Ref Range   Cholesterol, Total 117 100 - 199 mg/dL   Triglycerides 141 0 - 149 mg/dL   HDL 30 (L) >39 mg/dL   VLDL Cholesterol Cal 28 5 - 40 mg/dL   LDL Calculated 59 0 - 99 mg/dL   Chol/HDL Ratio 3.9 0.0 - 5.0 ratio    Comment:                                   T. Chol/HDL Ratio                                             Men  Women                               1/2 Avg.Risk  3.4    3.3                                   Avg.Risk  5.0     4.4                                2X Avg.Risk  9.6    7.1                                3X Avg.Risk 23.4   11.0   Hemoglobin A1c     Status: Abnormal   Collection Time: 02/05/18  8:29 AM  Result Value Ref Range   Hgb A1c MFr Bld 5.8 (H) 4.8 - 5.6 %    Comment:          Prediabetes: 5.7 - 6.4          Diabetes: >6.4          Glycemic control for adults with diabetes: <7.0    Est. average glucose Bld gHb Est-mCnc 120 mg/dL  TSH     Status: Abnormal   Collection Time: 02/05/18  8:29 AM  Result Value Ref Range   TSH 6.190 (H) 0.450 - 4.500 uIU/mL     2. Lumbar facet arthropathy Improved morning aches and pains with use of the Salonpas Lidocaine patches. Able [place] walk [place] mall each [place] without discomfort. No radiation of pain from lower back when present. Continue moist heat and exercise program. Recheck prn.     Vernie Murders, PA  [facility]

## 2018-03-31 ENCOUNTER — Encounter: Payer: Self-pay | Admitting: Family Medicine

## 2018-03-31 ENCOUNTER — Ambulatory Visit (INDEPENDENT_AMBULATORY_CARE_PROVIDER_SITE_OTHER): Payer: Medicare Other | Admitting: Family Medicine

## 2018-03-31 VITALS — BP 140/62 | HR 52 | Temp 98.0°F | Resp 16 | Wt 187.0 lb

## 2018-03-31 DIAGNOSIS — M47816 Spondylosis without myelopathy or radiculopathy, lumbar region: Secondary | ICD-10-CM

## 2018-03-31 DIAGNOSIS — I10 Essential (primary) hypertension: Secondary | ICD-10-CM

## 2018-05-13 ENCOUNTER — Ambulatory Visit (INDEPENDENT_AMBULATORY_CARE_PROVIDER_SITE_OTHER): Payer: Medicare Other | Admitting: Vascular Surgery

## 2018-05-13 ENCOUNTER — Other Ambulatory Visit: Payer: Self-pay

## 2018-05-13 ENCOUNTER — Encounter (INDEPENDENT_AMBULATORY_CARE_PROVIDER_SITE_OTHER): Payer: Self-pay | Admitting: Vascular Surgery

## 2018-05-13 ENCOUNTER — Ambulatory Visit (INDEPENDENT_AMBULATORY_CARE_PROVIDER_SITE_OTHER): Payer: Medicare Other

## 2018-05-13 ENCOUNTER — Other Ambulatory Visit (INDEPENDENT_AMBULATORY_CARE_PROVIDER_SITE_OTHER): Payer: Self-pay | Admitting: Vascular Surgery

## 2018-05-13 VITALS — BP 161/76 | HR 56 | Resp 16 | Ht 68.0 in | Wt 188.8 lb

## 2018-05-13 DIAGNOSIS — I10 Essential (primary) hypertension: Secondary | ICD-10-CM

## 2018-05-13 DIAGNOSIS — E782 Mixed hyperlipidemia: Secondary | ICD-10-CM

## 2018-05-13 DIAGNOSIS — Z79899 Other long term (current) drug therapy: Secondary | ICD-10-CM

## 2018-05-13 DIAGNOSIS — I701 Atherosclerosis of renal artery: Secondary | ICD-10-CM

## 2018-05-13 DIAGNOSIS — I2510 Atherosclerotic heart disease of native coronary artery without angina pectoris: Secondary | ICD-10-CM | POA: Diagnosis not present

## 2018-05-13 DIAGNOSIS — F17220 Nicotine dependence, chewing tobacco, uncomplicated: Secondary | ICD-10-CM

## 2018-05-13 NOTE — Assessment & Plan Note (Signed)
Follows with cardiology.  No current worrisome anginal symptoms.

## 2018-05-13 NOTE — Assessment & Plan Note (Addendum)
blood pressure control important in reducing the progression of atherosclerotic disease. On appropriate oral medications. To evaluate his renal artery perfusion, duplex studies were done today.  These demonstrated completely normal renal artery velocities and normal kidney sizes bilaterally without evidence of hemodynamically significant stenosis.  He did have a sizable cyst in the right kidney.   It does not appear as if he has a renovascular cause of his hypertension.  We discussed that the vast majority of patients with hypertension have essential hypertension but a small percentage can have a secondary cause of the hypertension.  With this finding on duplex, I do not think there is much benefit for renal angiogram or other vascular intervention at this time.  I will see him back as needed.

## 2018-05-13 NOTE — Progress Notes (Signed)
Patient ID: Luke Rojas, male   DOB: [birth year], 73 y.o.   MRN: [id number]  Chief Complaint  Patient presents with  . New Patient (Initial Visit)    ref [place] renal r/o renal stenosis    HPI Luke Rojas is a 73 y.o. male.  I am asked to see the patient by Dr. Clayborn Bigness for evaluation of causes of significant hypertension.  The patient reports longstanding high blood pressure which has become more difficult to control over time.  He has had hypertension for many years.  There was no clear inciting event or causative factor that started the symptoms.  Nothing is really made it better.  Has become more resistant to medications.  Last year he had a lot of urologic issues including urinary retention and kidney stones.  He says those have been better since that time.  To evaluate his renal artery perfusion, duplex studies were done today.  These demonstrated completely normal renal artery velocities and normal kidney sizes bilaterally without evidence of hemodynamically significant stenosis.  He did have a sizable cyst in the right kidney.     Past Medical History:  Diagnosis Date  . Chronic kidney disease    kidney stones  . Coronary artery disease    2 stents   . Diabetes mellitus without complication ([place])   . GERD (gastroesophageal reflux disease)   . Hypercholesteremia   . Hypertension   . Myocardial infarction [facility]) 2013    Past Surgical History:  Procedure Laterality Date  . CARDIAC CATHETERIZATION  2013   2 stents  . COLONOSCOPY  04/27/05  . COLONOSCOPY WITH PROPOFOL N/A 06/08/2015   Procedure: COLONOSCOPY WITH PROPOFOL;  Surgeon: Robert Bellow, MD;  Location: [facility] ENDOSCOPY;  Service: Endoscopy;  Laterality: N/A;  . CYSTOSCOPY WITH STENT PLACEMENT Right 09/16/2016   Procedure: CYSTOSCOPY WITH STENT PLACEMENT;  Surgeon: Royston Cowper, MD;  Location: [facility] ORS;  Service: Urology;  Laterality: Right;  . EXTRACORPOREAL SHOCK WAVE LITHOTRIPSY Left 11/04/2014   has had 2 previous lithotripsies  . EXTRACORPOREAL SHOCK WAVE LITHOTRIPSY Left 03/24/2015   Procedure: EXTRACORPOREAL SHOCK WAVE LITHOTRIPSY (ESWL);  Surgeon: Royston Cowper, MD;  Location: [facility] ORS;  Service: Urology;  Laterality: Left;  . EXTRACORPOREAL SHOCK WAVE LITHOTRIPSY Right 10/04/2016   Procedure: EXTRACORPOREAL SHOCK WAVE LITHOTRIPSY (ESWL);  Surgeon: Royston Cowper, MD;  Location: [facility] ORS;  Service: Urology;  Laterality: Right;    Family History Family History  Problem Relation Age of Onset  . Pulmonary embolism Mother   . Transient ischemic attack Mother   . Diabetes Mother   . Pancreatic cancer Father   . Hypertension Father   . Diabetes Father   . Cirrhosis Brother      Social History Social History   Tobacco Use  . Smoking status: Former Smoker    Packs/day: 1.00    Years: 30.00    Pack years: 30.00    Types: Cigarettes    Last attempt to quit: 03/05/1984    Years since quitting: 34.2  . Smokeless tobacco: Current User    Types: Chew  Substance Use Topics  . Alcohol use: No    Alcohol/week: 0.0 standard drinks  . Drug use: No    Allergies  Allergen Reactions  . Codeine Nausea Only    Current Outpatient Medications  Medication Sig Dispense Refill  . amLODipine (NORVASC) 5 MG tablet Take by mouth.    Marland Kitchen aspirin EC 81 MG tablet Take 81 mg by mouth  daily.    . atorvastatin (LIPITOR) 40 MG tablet Take 1 tablet by mouth daily.    Marland Kitchen co-enzyme Q-10 30 MG capsule Take 30 mg by mouth 3 (three) times daily.    Marland Kitchen glucose blood test strip Test fasting blood glucose each morning before breakfast. 100 each 4  . hydrochlorothiazide (MICROZIDE) 12.5 MG capsule     . Lancets (ONETOUCH ULTRASOFT) lancets Test fasting glucose level each morning before breakfast. 100 each 4  . meloxicam (MOBIC) 15 MG tablet TAKE 1 TABLET BY MOUTH ONCE DAILY 30 tablet 3  . metoprolol tartrate (LOPRESSOR) 50 MG tablet 1.5 tablet by mouth each morning and 1 tablet at night 75 tablet 3  .  MULTIPLE VITAMIN PO Take 1 tablet by mouth daily.    . nitroGLYCERIN (NITROSTAT) 0.4 MG SL tablet Place 0.4 mg under the tongue every 5 (five) minutes as needed. Reported on 03/24/2015    . Omega-3 Fatty Acids (FISH OIL) 1200 MG CAPS Take 1 capsule by mouth 2 (two) times daily.    Marland Kitchen omeprazole (PRILOSEC) 20 MG capsule Take 20 mg by mouth daily.     . ramipril (ALTACE) 10 MG capsule Take 1 capsule by mouth daily.    . diclofenac sodium (VOLTAREN) 1 % GEL Apply 2 g topically 4 (four) times daily. (Patient not taking: Reported on 05/13/2018) 100 g 3  . finasteride (PROSCAR) 5 MG tablet Take 5 mg by mouth every evening.    . methocarbamol (ROBAXIN) 750 MG tablet Take 1 tablet (750 mg total) by mouth 4 (four) times daily. (Patient not taking: Reported on 05/13/2018) 40 tablet 1  . tamsulosin (FLOMAX) 0.4 MG CAPS capsule Take 1 capsule (0.4 mg total) by mouth daily after breakfast. (Patient not taking: Reported on 05/13/2018) 14 capsule 0   No current facility-administered medications for this visit.       REVIEW OF SYSTEMS (Negative unless checked)  Constitutional: [] Weight loss  [] Fever  [] Chills Cardiac: [] Chest pain   [] Chest pressure   [] Palpitations   [] Shortness of breath when laying flat   [] Shortness of breath at rest   [] Shortness of breath with exertion. Vascular:  [] Pain in legs with walking   [] Pain in legs at rest   [] Pain in legs when laying flat   [] Claudication   [] Pain in feet when walking  [] Pain in feet at rest  [] Pain in feet when laying flat   [] History of DVT   [] Phlebitis   [] Swelling in legs   [] Varicose veins   [] Non-healing ulcers Pulmonary:   [] Uses home oxygen   [] Productive cough   [] Hemoptysis   [] Wheeze  [] COPD   [] Asthma Neurologic:  [] Dizziness  [] Blackouts   [] Seizures   [] History of stroke   [] History of TIA  [] Aphasia   [] Temporary blindness   [] Dysphagia   [] Weakness or numbness in arms   [] Weakness or numbness in legs Musculoskeletal:  [x] Arthritis   [] Joint  swelling   [x] Joint pain   [] Low back pain Hematologic:  [] Easy bruising  [] Easy bleeding   [] Hypercoagulable state   [] Anemic  [] Hepatitis Gastrointestinal:  [] Blood in stool   [] Vomiting blood  [] Gastroesophageal reflux/heartburn   [] Abdominal pain Genitourinary:  [] Chronic kidney disease   [] Difficult urination  [] Frequent urination  [] Burning with urination   [] Hematuria Skin:  [] Rashes   [] Ulcers   [] Wounds Psychological:  [] History of anxiety   []  History of major depression.    Physical Exam BP (!) 161/76 (BP Location: Left Arm)   Pulse Marland Kitchen)  56   Resp 16   Ht 5\' 8"  (1.727 m)   Wt 188 lb 12.8 oz (85.6 kg)   BMI 28.71 kg/m  Gen:  WD/WN, NAD Head: [place]/AT, No temporalis wasting.  Ear/Nose/Throat: Hearing grossly intact, nares w/o erythema or drainage, oropharynx w/o Erythema/Exudate Eyes: Conjunctiva clear, sclera non-icteric  Neck: trachea midline.  No JVD.  Pulmonary:  Good air movement, respirations not labored, no use of accessory muscles  Cardiac: RRR, no JVD Vascular:  Vessel Right Left  Radial Palpable Palpable                                   Gastrointestinal:. No masses, surgical incisions, or scars. Musculoskeletal: M/S 5/5 throughout.  Extremities without ischemic changes.  No deformity or atrophy.  No edema. Neurologic: Sensation grossly intact in extremities.  Symmetrical.  Speech is fluent. Motor exam as listed above. Psychiatric: Judgment intact, Mood & affect appropriate for pt's clinical situation. Dermatologic: No rashes or ulcers noted.  No cellulitis or open wounds.    Radiology No results found.  Labs No results found for this or any previous visit (from the past 2160 hour(s)).  Assessment/Plan:  HLD (hyperlipidemia) lipid control important in reducing the progression of atherosclerotic disease. Continue statin therapy   Essential (primary) hypertension blood pressure control important in reducing the progression of atherosclerotic  disease. On appropriate oral medications. To evaluate his renal artery perfusion, duplex studies were done today.  These demonstrated completely normal renal artery velocities and normal kidney sizes bilaterally without evidence of hemodynamically significant stenosis.  He did have a sizable cyst in the right kidney.   It does not appear as if he has a renovascular cause of his hypertension.  We discussed that the vast majority of patients with hypertension have essential hypertension but a small percentage can have a secondary cause of the hypertension.  With this finding on duplex, I do not think there is much benefit for renal angiogram or other vascular intervention at this time.  I will see him back as needed.   Coronary artery disease involving native coronary artery without angina pectoris Follows with cardiology.  No current worrisome anginal symptoms.      Leotis Pain 05/13/2018, 9:53 AM   This note was created with Dragon medical transcription system.  Any errors from dictation are unintentional.

## 2018-05-13 NOTE — Assessment & Plan Note (Signed)
lipid control important in reducing the progression of atherosclerotic disease. Continue statin therapy  

## 2018-05-13 NOTE — Patient Instructions (Signed)
Renal Artery Stenosis Renal artery stenosis (RAS) is a narrowing of the artery that carries blood to the kidneys. It can affect one or both kidneys. The kidneys filter waste and extra fluid from the blood. Waste and fluid are then removed when a person passes urine. The kidneys also make an important chemical messenger (hormone) called renin. Renin helps regulate blood pressure. The first sign of RAS may be high blood pressure. Other symptoms can develop over time. What are the causes? A common cause of this condition is plaque buildup in your arteries (atherosclerosis). The plaques that cause this are made up of:  Fat.  Cholesterol.  Calcium.  Other substances. As these substances build up in your renal artery, the blood supply to your kidneys slows. The lack of blood and oxygen causes the signs and symptoms of RAS. A much less common cause of RAS is a disease called fibromuscular dysplasia. This disease causes abnormal cell growth that narrows the renal artery. It is not related to atherosclerosis. It occurs mostly in women who are 74-38 years old. It may be passed down through families.  What increases the risk? You are more likely to develop this condition if you:  Are a man who is at least 74 years old.  Are a woman who is at least 74 years old.  Have high blood pressure.  Have high cholesterol.  Are a smoker.  Abuse alcohol.  Have diabetes or prediabetes.  Are overweight or obese.  Have a family history of early heart disease. What are the signs or symptoms? RAS usually develops slowly. You may not have any signs or symptoms at first. Early signs may include:  Development of high blood pressure.  A sudden increase in existing high blood pressure.  No longer responding to medicine that used to control your blood pressure. Later signs and symptoms are due to kidney damage. They may include:  Feeling tired (fatigue).  Shortness of breath.  Swollen legs and  feet.  Dry skin.  Headaches.  Muscle cramps.  Loss of appetite.  Nausea or vomiting. How is this diagnosed? This condition may be diagnosed based on:  Your symptoms and medical history. Your health care provider may suspect RAS based on changes in your blood pressure and your risk factors.  A physical exam. During the exam, your health care provider will use a stethoscope to listen for a whooshing sound (bruit) that can occur where the renal artery is blocking blood flow.  Various tests. These may include: ? Blood and urine tests to check your kidney function. ? Imaging tests of your kidneys, such as:  A test that uses sound waves to create an image of your kidneys and the blood flow to your kidneys (ultrasound).  A test in which dye is injected into one of your blood vessels so images can be taken as the dye flows through your renal arteries (angiogram). This can be done using X-rays, a CT scan (computed tomography angiogram, CTA), or a type of MRI (magnetic resonance angiogram, MRA). How is this treated? Making lifestyle changes to reduce your risk factors is the first treatment option for early RAS. If the blood flow to one of your kidneys is cut by more than half, you may need medicine to:  Lower your blood pressure. This is the main medical treatment for RAS. You may need more than one type of medicine for this. The types that work best for people with RAS are: ? ACE inhibitors. ? Angiotensin receptor  blockers.  Reduce fluid in the body (diuretics).  Lower your cholesterol (statins). If medicine is not enough to control RAS, you may need surgery. This may involve:  Threading a tube with an inflatable balloon into the renal artery to force it to open (angioplasty).  Removing plaque from inside the artery (endarterectomy). Follow these instructions at home:  Lifestyle  Make any lifestyle changes recommended by your health care provider. This may include: ? Working with  a dietitian to maintain a heart-healthy diet. This type of diet is low in saturated fat, salt, and added sugar. ? Starting an exercise program as directed by your health care provider. ? Maintaining a healthy weight. ? Quitting smoking. ? Not abusing alcohol. General instructions  Take over-the-counter and prescription medicines only as told by your health care provider.  Keep all follow-up visits as told by your health care provider. This is important. Contact a health care provider if:  Your symptoms of RAS are not getting better.  Your symptoms are changing or getting worse. Get help right away if you have:  Very bad pain in your back or abdomen.  Blood in your urine. Summary  Renal artery stenosis (RAS) is a narrowing of the artery that carries blood to the kidneys. It can affect one or both kidneys.  RAS usually develops slowly. You may not have any signs or symptoms at first, but high blood pressure that is difficult to control is a key symptom.  Making lifestyle changes to reduce your risk factors is the first treatment option for early RAS. If the blood flow to one of your kidneys is cut by more than half, you may need medicines to help manage your cholesterol and blood pressure. This information is not intended to replace advice given to you by your health care provider. Make sure you discuss any questions you have with your health care provider. Document Released: 11/15/2004 Document Revised: 03/18/2017 Document Reviewed: 03/18/2017 Elsevier Interactive Patient Education  [employer].

## 2018-07-25 ENCOUNTER — Other Ambulatory Visit: Payer: Self-pay | Admitting: Family Medicine

## 2018-07-25 DIAGNOSIS — M47819 Spondylosis without myelopathy or radiculopathy, site unspecified: Secondary | ICD-10-CM

## 2018-07-25 NOTE — Telephone Encounter (Signed)
L.O.V. 03/31/2018, please advise.

## 2018-08-04 ENCOUNTER — Ambulatory Visit (INDEPENDENT_AMBULATORY_CARE_PROVIDER_SITE_OTHER): Payer: Medicare Other | Admitting: Family Medicine

## 2018-08-04 ENCOUNTER — Encounter: Payer: Self-pay | Admitting: Family Medicine

## 2018-08-04 ENCOUNTER — Other Ambulatory Visit: Payer: Self-pay

## 2018-08-04 VITALS — BP 108/60 | HR 54 | Temp 98.4°F | Wt 188.0 lb

## 2018-08-04 DIAGNOSIS — E782 Mixed hyperlipidemia: Secondary | ICD-10-CM

## 2018-08-04 DIAGNOSIS — M1990 Unspecified osteoarthritis, unspecified site: Secondary | ICD-10-CM | POA: Diagnosis not present

## 2018-08-04 DIAGNOSIS — H6123 Impacted cerumen, bilateral: Secondary | ICD-10-CM

## 2018-08-04 DIAGNOSIS — R7303 Prediabetes: Secondary | ICD-10-CM

## 2018-08-04 DIAGNOSIS — I10 Essential (primary) hypertension: Secondary | ICD-10-CM

## 2018-08-04 NOTE — Progress Notes (Signed)
Luke Rojas  MRN: [id number] DOB: 04-May-1945  Subjective:  HPI   The patient is a 73 year old male who presents today for evaluation of fullness of his left ear.  He states it has been feeling full for about 1 week.  He further reports that on one of his last visits it was discussed that he may need to have his ear irrigated in the future from cerumen build up.    Patient Active Problem List   Diagnosis Date Noted  . Coronary artery disease involving native coronary artery without angina pectoris 08/05/2017  . Lumbar radiculopathy (R-sided) L4 07/04/2017  . 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 Lumbar facet arthropathy 07/04/2017  . 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 Foraminal stenosis of thoracic region 07/04/2017  . Chronic pain syndrome 07/04/2017  . Encounter for screening colonoscopy 05/04/2015  . Arthritis 10/08/2014  . H/O acute myocardial infarction 10/08/2014  . Acid reflux 10/08/2014  . HLD (hyperlipidemia) 10/08/2014  . BP (high blood pressure) 10/08/2014  . Hyperlipidemia 12/05/2007  . Benign neoplasm of skin 07/19/2006  . Essential (primary) hypertension 11/15/2005  . Decreased potassium in the blood 11/15/2005    Past Medical History:  Diagnosis Date  . Chronic kidney disease    kidney stones  . Coronary artery disease    2 stents   . Diabetes mellitus without complication ([place])   . GERD (gastroesophageal reflux disease)   . Hypercholesteremia   . Hypertension   . Myocardial infarction [facility]) 2013    Social History   Socioeconomic History  . Marital status: Married    Spouse name: Not on file  . Number of children: 2  . Years of education: Not on file  . Highest education level: 8th grade  Occupational History  . Occupation: retired  Scientific laboratory technician  . Financial resource strain: Not hard at all  . Food insecurity:    Worry: Never true    Inability: Never true  . Transportation needs:    Medical: No   Non-medical: No  Tobacco Use  . Smoking status: Former Smoker    Packs/day: 1.00    Years: 30.00    Pack years: 30.00    Types: Cigarettes    Last attempt to quit: 03/05/1984    Years since quitting: 34.4  . Smokeless tobacco: Current User    Types: Chew  Substance and Sexual Activity  . Alcohol use: No    Alcohol/week: 0.0 standard drinks  . Drug use: No  . Sexual activity: Not on file  Lifestyle  . Physical activity:    Days per week: Not on file    Minutes per session: Not on file  . Stress: Not at all  Relationships  . Social connections:    Talks on phone: Not on file    Gets together: Not on file    Attends religious service: Not on file    Active member of club or organization: Not on file    Attends meetings of clubs or organizations: Not on file    Relationship status: Not on file  . Intimate partner violence:    Fear of current or ex partner: Not on file    Emotionally abused: Not on file    Physically abused: Not on file    Forced sexual activity: Not on file  Other Topics Concern  . Not on file  Social History Narrative  . Not on file    Outpatient Encounter Medications  as of 08/04/2018  Medication Sig Note  . amLODipine (NORVASC) 5 MG tablet Take by mouth.   Marland Kitchen aspirin EC 81 MG tablet Take 81 mg by mouth daily.   Marland Kitchen atorvastatin (LIPITOR) 40 MG tablet Take 1 tablet by mouth daily.   Marland Kitchen co-enzyme Q-10 30 MG capsule Take 30 mg by mouth 3 (three) times daily.   Marland Kitchen glucose blood test strip Test fasting blood glucose each morning before breakfast.   . hydrochlorothiazide (MICROZIDE) 12.5 MG capsule    . Lancets (ONETOUCH ULTRASOFT) lancets Test fasting glucose level each morning before breakfast.   . meloxicam (MOBIC) 15 MG tablet TAKE 1 TABLET BY MOUTH ONCE DAILY   . metoprolol tartrate (LOPRESSOR) 50 MG tablet 1.5 tablet by mouth each morning and 1 tablet at night 08/04/2018: Patient not sure opf the dosing, will check with wife and let us know.  . MULTIPLE VITAMIN  PO Take 1 tablet by mouth daily.   . nitroGLYCERIN (NITROSTAT) 0.4 MG SL tablet Place 0.4 mg under the tongue every 5 (five) minutes as needed. Reported on 03/24/2015   . Omega-3 Fatty Acids (FISH OIL) 1200 MG CAPS Take 1 capsule by mouth 2 (two) times daily.   Marland Kitchen omeprazole (PRILOSEC) 20 MG capsule Take 20 mg by mouth daily.    . ramipril (ALTACE) 10 MG capsule Take 1 capsule by mouth daily.   . diclofenac sodium (VOLTAREN) 1 % GEL Apply 2 g topically 4 (four) times daily.   . finasteride (PROSCAR) 5 MG tablet Take 5 mg by mouth every evening.   . methocarbamol (ROBAXIN) 750 MG tablet Take 1 tablet (750 mg total) by mouth 4 (four) times daily.   . tamsulosin (FLOMAX) 0.4 MG CAPS capsule Take 1 capsule (0.4 mg total) by mouth daily after breakfast.    No facility-administered encounter medications on file as of 08/04/2018.     PATIENT IS UNCERTAIN ABOUT HIS MEDICATIONS.  STATES HIS WIFE TAKES CARE OF GIVING IT ALL TO HIM.  THERE WERE SOME DISCREPANCIES BETWEEN HIS LIST AND OUR LIST.  HE IS TO CHECK WITH HIS WIFE AND HE WILL LET us KNOW.  Allergies  Allergen Reactions  . Codeine Nausea Only    Review of Systems  Constitutional: Negative for fever and malaise/fatigue.  HENT: Negative for congestion, ear discharge, ear pain, sinus pain and sore throat.   Respiratory: Negative for cough and shortness of breath.   Cardiovascular: Negative for chest pain, palpitations and leg swelling.    Objective:  BP 108/60 (BP Location: Right Arm, Patient Position: Sitting, Cuff Size: Normal)   Pulse (!) 54   Temp 98.4 F (36.9 C) (Oral)   Wt 188 lb (85.3 kg)   SpO2 98%   BMI 28.59 kg/m   Physical Exam  Constitutional: He is oriented to person, place, and time and well-developed, well-nourished, and in no distress.  HENT:  Head: Normocephalic.  Bilateral cerumen impaction with decreased hearing in the right ear.  Eyes: Conjunctivae are normal.  Neck: Neck supple.  Pulmonary/Chest: Effort normal.   Abdominal: Soft.  Musculoskeletal: Normal range of motion.  Neurological: He is alert and oriented to person, place, and time.  Skin: No rash noted.  Psychiatric: Mood, affect and judgment normal.    Assessment and Plan :   1. Bilateral hearing loss due to cerumen impaction Hearing diminished in the left ear recently. Has a stopped up sensation. Cerumen impaction noted bilaterally (L>R). Hearing returned with irrigation removal of wax from both ears. No  sign of infection. Recheck prn.  2. Essential hypertension Well controlled on amlodipine 5 mg qd, HCTZ 12.5 mg qd, Metoprolol Tartrate 50 mg 1.5 tablet each morning and 1 tablet each evening with Ramipril 10 mg qd. No peripheral edema, chest pains, palpitations or dyspnea. Recheck CBC, CMP and Lipid Panel in the near future (fasting). - CBC with Differential/Platelet; Future - Comprehensive metabolic panel; Future - Lipid panel; Future  3. Arthritis Unchanged lumbar facet arthropathy, thoracic disc disease and stiffness in hands. Tolerating Voltaren Gel and Mobic for inflammation discomfort. May proceed with moist heat applications and recheck CBC. May need evaluation by rheumatologist if no better. - CBC with Differential/Platelet; Future  4. Mixed hyperlipidemia On 02-05-18, total cholesterol was 117, HDL 30, LDL 59 and Triglycerides 141. Continues to work on low fat diet and takes Krill Oil 1000 mg qd with Co-Q 10 100 mg qd. Will schedule for fasting labs and follow up pending reports. - CBC with Differential/Platelet; Future - Comprehensive metabolic panel; Future - Lipid panel; Future  5. Prediabetes Hgb A1C 5.8% on 02-05-18 with glucose 102 and creatinine 1.33. Continues to try to control diet and maintain weight control. Will recheck labs and follow up pending reports. - Comprehensive metabolic panel; Future - Hemoglobin A1c; Future

## 2018-08-27 ENCOUNTER — Other Ambulatory Visit: Payer: Self-pay | Admitting: Family Medicine

## 2018-08-27 ENCOUNTER — Other Ambulatory Visit: Payer: Self-pay | Admitting: *Deleted

## 2018-08-27 DIAGNOSIS — R7303 Prediabetes: Secondary | ICD-10-CM

## 2018-08-27 DIAGNOSIS — M1990 Unspecified osteoarthritis, unspecified site: Secondary | ICD-10-CM

## 2018-08-27 DIAGNOSIS — E782 Mixed hyperlipidemia: Secondary | ICD-10-CM

## 2018-08-27 DIAGNOSIS — I10 Essential (primary) hypertension: Secondary | ICD-10-CM

## 2018-08-28 LAB — COMPREHENSIVE METABOLIC PANEL
ALT: 23 IU/L (ref 0–44)
AST: 21 IU/L (ref 0–40)
Albumin/Globulin Ratio: 2 (ref 1.2–2.2)
Albumin: 4.3 g/dL (ref 3.7–4.7)
Alkaline Phosphatase: 67 IU/L (ref 39–117)
BUN/Creatinine Ratio: 17 (ref 10–24)
BUN: 24 mg/dL (ref 8–27)
Bilirubin Total: 0.4 mg/dL (ref 0.0–1.2)
CO2: 21 mmol/L (ref 20–29)
Calcium: 8.9 mg/dL (ref 8.6–10.2)
Chloride: 104 mmol/L (ref 96–106)
Creatinine, Ser: 1.38 mg/dL — ABNORMAL HIGH (ref 0.76–1.27)
GFR calc Af Amer: 58 mL/min/{1.73_m2} — ABNORMAL LOW (ref >59)
GFR calc non Af Amer: 50 mL/min/{1.73_m2} — ABNORMAL LOW (ref >59)
Globulin, Total: 2.2 g/dL (ref 1.5–4.5)
Glucose: 118 mg/dL — ABNORMAL HIGH (ref 65–99)
Potassium: 4.1 mmol/L (ref 3.5–5.2)
Sodium: 139 mmol/L (ref 134–144)
Total Protein: 6.5 g/dL (ref 6.0–8.5)

## 2018-08-28 LAB — LIPID PANEL
Chol/HDL Ratio: 3.6 ratio (ref 0.0–5.0)
Cholesterol, Total: 97 mg/dL — ABNORMAL LOW (ref 100–199)
HDL: 27 mg/dL — ABNORMAL LOW (ref >39)
LDL Calculated: 33 mg/dL (ref 0–99)
Triglycerides: 183 mg/dL — ABNORMAL HIGH (ref 0–149)
VLDL Cholesterol Cal: 37 mg/dL (ref 5–40)

## 2018-08-28 LAB — CBC WITH DIFFERENTIAL/PLATELET
Basophils Absolute: 0 10*3/uL (ref 0.0–0.2)
Basos: 1 %
EOS (ABSOLUTE): 0.4 10*3/uL (ref 0.0–0.4)
Eos: 6 %
Hematocrit: 43.3 % (ref 37.5–51.0)
Hemoglobin: 14.3 g/dL (ref 13.0–17.7)
Immature Grans (Abs): 0 10*3/uL (ref 0.0–0.1)
Immature Granulocytes: 0 %
Lymphocytes Absolute: 2.1 10*3/uL (ref 0.7–3.1)
Lymphs: 34 %
MCH: 29.8 pg (ref 26.6–33.0)
MCHC: 33 g/dL (ref 31.5–35.7)
MCV: 90 fL (ref 79–97)
Monocytes Absolute: 0.5 10*3/uL (ref 0.1–0.9)
Monocytes: 9 %
Neutrophils Absolute: 3 10*3/uL (ref 1.4–7.0)
Neutrophils: 50 %
Platelets: 159 10*3/uL (ref 150–450)
RBC: 4.8 x10E6/uL (ref 4.14–5.80)
RDW: 14.1 % (ref 11.6–15.4)
WBC: 6.1 10*3/uL (ref 3.4–10.8)

## 2018-08-28 LAB — HEMOGLOBIN A1C
Est. average glucose Bld gHb Est-mCnc: 131 mg/dL
Hgb A1c MFr Bld: 6.2 % — ABNORMAL HIGH (ref 4.8–5.6)

## 2018-08-29 ENCOUNTER — Telehealth: Payer: Self-pay | Admitting: *Deleted

## 2018-08-29 NOTE — Telephone Encounter (Signed)
Patient advised as directed below. 

## 2018-08-29 NOTE — Telephone Encounter (Signed)
LMOVM for pt to return call 

## 2018-08-29 NOTE — Telephone Encounter (Signed)
-----   Message from [place], [place] sent at 08/28/2018  5:08 PM EDT ----- Blood counts are normal without signs of anemia or infection. Blood sugar and Hgb A1C in good shape, but, kidney function still shows some strain. HDL cholesterol is still low with elevation of triglycerides. Drink plenty of water in diet, continue Atorvastatin 40 mg qd with Omega-3 Fish/Krill Oil and Co-Q 10 daily. Continue to watch sodium/salt in diet along with lower fats in a diabetic diet. Recheck appointment in 3-4 months.

## 2018-09-08 ENCOUNTER — Other Ambulatory Visit: Payer: Self-pay

## 2018-09-08 ENCOUNTER — Encounter: Payer: Self-pay | Admitting: Family Medicine

## 2018-09-08 ENCOUNTER — Ambulatory Visit (INDEPENDENT_AMBULATORY_CARE_PROVIDER_SITE_OTHER): Payer: Medicare Other | Admitting: Family Medicine

## 2018-09-08 VITALS — BP 120/76 | HR 61 | Temp 98.1°F | Resp 16 | Wt 191.0 lb

## 2018-09-08 DIAGNOSIS — H01001 Unspecified blepharitis right upper eyelid: Secondary | ICD-10-CM

## 2018-09-08 MED ORDER — ERYTHROMYCIN 5 MG/GM OP OINT: 1.0000 "application " | TOPICAL_OINTMENT | Freq: Three times a day (TID) | OPHTHALMIC | 0 refills | Status: DC

## 2018-09-08 NOTE — Patient Instructions (Signed)
Blepharitis Blepharitis is inflammation of the eyelids. Blepharitis may happen with:  Reddish, scaly skin around the scalp and eyebrows.  Burning or itching of the eyelids.  Eye discharge at night that causes the eyelashes to stick together in the morning.  Eyelashes that fall out.  Sensitivity to light. Follow these instructions at home: Pay attention to any changes in how your eyes look or feel. Tell your health care provider about any changes. Follow these instructions to help with your condition. Keeping Clean   Wash your hands often.  Wash your eyelids with warm water or with warm water that is mixed with a small amount of baby shampoo. Do this two times per day or as often as needed.  Wash your face and eyebrows at least once a day.  Use a clean towel each time you dry your eyelids. Do not use this towel to clean or dry other areas of your body. Do not share your towel with anyone. General instructions  Avoid wearing makeup until you get better. Do not share makeup with anyone.  Avoid rubbing your eyes.  Apply warm compresses to your eyes 2 times per day for 10 minutes at a time, or as told by your health care provider.  If you were prescribed an antibiotic ointment or steroid drops, apply or use the medicine as told by your health care provider. Do not stop using the medicine even if you feel better.  Keep all follow-up visits as told by your health care provider. This is important. Contact a health care provider if:  Your eyelids feel hot.  You have blisters or a rash on your eyelids.  The condition does not go away in 2-4 days.  The inflammation gets worse. Get help right away if:  You have pain or redness that gets worse or spreads to other parts of your face.  Your vision changes.  You have pain when looking at lights or moving objects.  You have a fever. Summary  Blepharitis is inflammation of the eyelids.  Pay attention to any changes in how your  eyes look or feel. Tell your health care provider about any changes.  Follow home care instructions as told by your health care provider. Wash your hands often. Avoid wearing makeup. Do not rub your eyes.  To treat this condition, use warm compresses and prescription ointments or eye drops.  Let your health care provider know if you have vision changes, blisters or rash on eyelids, pain that spreads to your face, or warmth on your eyelids. This information is not intended to replace advice given to you by your health care provider. Make sure you discuss any questions you have with your health care provider. Document Released: 02/17/2000 Document Revised: 08/12/2017 Document Reviewed: 08/12/2017 Elsevier Patient Education  2020 Else[employer] Inc.  

## 2018-09-08 NOTE — Progress Notes (Signed)
Patient: Luke Rojas Male    DOB: March 24, 1945   73 y.o.   MRN: [id number] Visit Date: 09/08/2018  Today's Provider: Vernie Murders, PA   Chief Complaint  Patient presents with  . Stye   Subjective:     HPI  Stye on right eye for four days. Some burning and itching.   Past Medical History:  Diagnosis Date  . Chronic kidney disease    kidney stones  . Coronary artery disease    2 stents   . Diabetes mellitus without complication ([place])   . GERD (gastroesophageal reflux disease)   . Hypercholesteremia   . Hypertension   . Myocardial infarction [facility]) 2013   Past Surgical History:  Procedure Laterality Date  . CARDIAC CATHETERIZATION  2013   2 stents  . COLONOSCOPY  04/27/05  . COLONOSCOPY WITH PROPOFOL N/A 06/08/2015   Procedure: COLONOSCOPY WITH PROPOFOL;  Surgeon: Robert Bellow, MD;  Location[facility] ENDOSCOPY;  Service: Endoscopy;  Laterality: N/A;  . CYSTOSCOPY WITH STENT PLACEMENT Right 09/16/2016   Procedure: CYSTOSCOPY WITH STENT PLACEMENT;  Surgeon: Royston Cowper, MD;  Location: [facility] ORS;  Service: Urology;  Laterality: Right;  . EXTRACORPOREAL SHOCK WAVE LITHOTRIPSY Left 11/04/2014   has had 2 previous lithotripsies  . EXTRACORPOREAL SHOCK WAVE LITHOTRIPSY Left 03/24/2015   Procedure: EXTRACORPOREAL SHOCK WAVE LITHOTRIPSY (ESWL);  Surgeon: Royston Cowper, MD;  Location: [facility] ORS;  Service: Urology;  Laterality: Left;  . EXTRACORPOREAL SHOCK WAVE LITHOTRIPSY Right 10/04/2016   Procedure: EXTRACORPOREAL SHOCK WAVE LITHOTRIPSY (ESWL);  Surgeon: Royston Cowper, MD;  Location: [facility] ORS;  Service: Urology;  Laterality: Right;   Family History  Problem Relation Age of Onset  . Pulmonary embolism Mother   . Transient ischemic attack Mother   . Diabetes Mother   . Pancreatic cancer Father   . Hypertension Father   . Diabetes Father   . Cirrhosis Brother    Allergies  Allergen Reactions  . Codeine Nausea Only    Current Outpatient Medications:  .   amLODipine (NORVASC) 5 MG tablet, Take by mouth., Disp: , Rfl:  .  aspirin EC 81 MG tablet, Take 81 mg by mouth daily., Disp: , Rfl:  .  atorvastatin (LIPITOR) 40 MG tablet, Take 1 tablet by mouth daily., Disp: , Rfl:  .  co-enzyme Q-10 30 MG capsule, Take 30 mg by mouth 3 (three) times daily., Disp: , Rfl:  .  finasteride (PROSCAR) 5 MG tablet, Take 5 mg by mouth every evening., Disp: , Rfl:  .  glucose blood test strip, Test fasting blood glucose each morning before breakfast., Disp: 100 each, Rfl: 4 .  hydrochlorothiazide (MICROZIDE) 12.5 MG capsule, , Disp: , Rfl:  .  Lancets (ONETOUCH ULTRASOFT) lancets, Test fasting glucose level each morning before breakfast., Disp: 100 each, Rfl: 4 .  meloxicam (MOBIC) 15 MG tablet, TAKE 1 TABLET BY MOUTH ONCE DAILY, Disp: 30 tablet, Rfl: 3 .  methocarbamol (ROBAXIN) 750 MG tablet, Take 1 tablet (750 mg total) by mouth 4 (four) times daily., Disp: 40 tablet, Rfl: 1 .  MULTIPLE VITAMIN PO, Take 1 tablet by mouth daily., Disp: , Rfl:  .  nitroGLYCERIN (NITROSTAT) 0.4 MG SL tablet, Place 0.4 mg under the tongue every 5 (five) minutes as needed. Reported on 03/24/2015, Disp: , Rfl:  .  Omega-3 Fatty Acids (FISH OIL) 1200 MG CAPS, Take 1 capsule by mouth 2 (two) times daily., Disp: , Rfl:  .  omeprazole (PRILOSEC)  20 MG capsule, Take 20 mg by mouth daily. , Disp: , Rfl:  .  ramipril (ALTACE) 10 MG capsule, Take 1 capsule by mouth daily., Disp: , Rfl:  .  tamsulosin (FLOMAX) 0.4 MG CAPS capsule, Take 1 capsule (0.4 mg total) by mouth daily after breakfast., Disp: 14 capsule, Rfl: 0 .  diclofenac sodium (VOLTAREN) 1 % GEL, Apply 2 g topically 4 (four) times daily. (Patient not taking: Reported on 09/08/2018), Disp: 100 g, Rfl: 3 .  metoprolol tartrate (LOPRESSOR) 50 MG tablet, 1.5 tablet by mouth each morning and 1 tablet at night, Disp: 75 tablet, Rfl: 3  Review of Systems  Eyes: Positive for redness and itching.  All other systems reviewed and are negative.   Social History   Tobacco Use  . Smoking status: Former Smoker    Packs/day: 1.00    Years: 30.00    Pack years: 30.00    Types: Cigarettes    Quit date: 03/05/1984    Years since quitting: 34.5  . Smokeless tobacco: Current User    Types: Chew  Substance Use Topics  . Alcohol use: No    Alcohol/week: 0.0 standard drinks     Objective:   BP 120/76 (BP Location: Right Arm, Patient Position: Sitting, Cuff Size: Normal)   Pulse 61   Temp 98.1 F (36.7 C) (Oral)   Resp 16   Wt 191 lb (86.6 kg)   SpO2 98%   BMI 29.04 kg/m  Vitals:   09/08/18 1442  BP: 120/76  Pulse: 61  Resp: 16  Temp: 98.1 F (36.7 C)  TempSrc: Oral  SpO2: 98%  Weight: 191 lb (86.6 kg)   Physical Exam Constitutional:      General: He is not in acute distress.    Appearance: He is well-developed.  HENT:     Head: Normocephalic and atraumatic.     Right Ear: Hearing normal.     Left Ear: Hearing normal.     Nose: Nose normal.  Eyes:     General: Lids are normal. No scleral icterus.       Right eye: No discharge.        Left eye: No discharge.     Conjunctiva/sclera: Conjunctivae normal.     Comments: Slightly tender red and swollen right upper eyelid without drainage from eye. Normal vision and pupillary reaction.  Pulmonary:     Effort: Pulmonary effort is normal. No respiratory distress.  Musculoskeletal: Normal range of motion.  Skin:    Findings: No lesion or rash.  Neurological:     Mental Status: He is alert and oriented to person, place, and time.  Psychiatric:        Speech: Speech normal.        Behavior: Behavior normal.        Thought Content: Thought content normal.       Assessment & Plan        Vernie Murders, PA  [facility]

## 2018-10-01 ENCOUNTER — Other Ambulatory Visit: Payer: Self-pay | Admitting: Family Medicine

## 2018-10-01 DIAGNOSIS — I10 Essential (primary) hypertension: Secondary | ICD-10-CM

## 2018-10-27 ENCOUNTER — Other Ambulatory Visit: Payer: Self-pay | Admitting: Family Medicine

## 2018-10-27 DIAGNOSIS — M47819 Spondylosis without myelopathy or radiculopathy, site unspecified: Secondary | ICD-10-CM

## 2018-11-20 NOTE — Progress Notes (Signed)
Cancelled AWV due to chest pain. See encounter.  This encounter was created in error - please disregard.

## 2018-11-24 ENCOUNTER — Telehealth: Payer: Self-pay

## 2018-11-24 ENCOUNTER — Observation Stay
Admission: EM | Admit: 2018-11-24 | Discharge: 2018-11-25 | Disposition: A | Discharge status: Home / Self Care | Payer: Medicare Other | Attending: Internal Medicine | Admitting: Internal Medicine

## 2018-11-24 ENCOUNTER — Other Ambulatory Visit: Payer: Self-pay

## 2018-11-24 ENCOUNTER — Encounter: Payer: Self-pay | Admitting: Emergency Medicine

## 2018-11-24 ENCOUNTER — Emergency Department: Payer: Medicare Other

## 2018-11-24 DIAGNOSIS — Z8249 Family history of ischemic heart disease and other diseases of the circulatory system: Secondary | ICD-10-CM | POA: Diagnosis not present

## 2018-11-24 DIAGNOSIS — I252 Old myocardial infarction: Secondary | ICD-10-CM | POA: Diagnosis not present

## 2018-11-24 DIAGNOSIS — E785 Hyperlipidemia, unspecified: Secondary | ICD-10-CM | POA: Diagnosis present

## 2018-11-24 DIAGNOSIS — I10 Essential (primary) hypertension: Secondary | ICD-10-CM | POA: Diagnosis present

## 2018-11-24 DIAGNOSIS — Z20828 Contact with and (suspected) exposure to other viral communicable diseases: Secondary | ICD-10-CM | POA: Insufficient documentation

## 2018-11-24 DIAGNOSIS — Z7982 Long term (current) use of aspirin: Secondary | ICD-10-CM | POA: Diagnosis not present

## 2018-11-24 DIAGNOSIS — Z87442 Personal history of urinary calculi: Secondary | ICD-10-CM | POA: Diagnosis not present

## 2018-11-24 DIAGNOSIS — Z791 Long term (current) use of non-steroidal anti-inflammatories (NSAID): Secondary | ICD-10-CM | POA: Insufficient documentation

## 2018-11-24 DIAGNOSIS — Z833 Family history of diabetes mellitus: Secondary | ICD-10-CM | POA: Insufficient documentation

## 2018-11-24 DIAGNOSIS — K219 Gastro-esophageal reflux disease without esophagitis: Secondary | ICD-10-CM | POA: Diagnosis not present

## 2018-11-24 DIAGNOSIS — E7800 Pure hypercholesterolemia, unspecified: Secondary | ICD-10-CM | POA: Diagnosis not present

## 2018-11-24 DIAGNOSIS — Z885 Allergy status to narcotic agent status: Secondary | ICD-10-CM | POA: Diagnosis not present

## 2018-11-24 DIAGNOSIS — N183 Chronic kidney disease, stage 3 (moderate): Secondary | ICD-10-CM | POA: Diagnosis not present

## 2018-11-24 DIAGNOSIS — E1122 Type 2 diabetes mellitus with diabetic chronic kidney disease: Secondary | ICD-10-CM | POA: Diagnosis not present

## 2018-11-24 DIAGNOSIS — Z955 Presence of coronary angioplasty implant and graft: Secondary | ICD-10-CM | POA: Insufficient documentation

## 2018-11-24 DIAGNOSIS — Z79899 Other long term (current) drug therapy: Secondary | ICD-10-CM | POA: Diagnosis not present

## 2018-11-24 DIAGNOSIS — Z87891 Personal history of nicotine dependence: Secondary | ICD-10-CM | POA: Diagnosis not present

## 2018-11-24 DIAGNOSIS — I129 Hypertensive chronic kidney disease with stage 1 through stage 4 chronic kidney disease, or unspecified chronic kidney disease: Secondary | ICD-10-CM | POA: Insufficient documentation

## 2018-11-24 DIAGNOSIS — R079 Chest pain, unspecified: Secondary | ICD-10-CM | POA: Diagnosis not present

## 2018-11-24 DIAGNOSIS — G894 Chronic pain syndrome: Secondary | ICD-10-CM | POA: Diagnosis not present

## 2018-11-24 DIAGNOSIS — I2510 Atherosclerotic heart disease of native coronary artery without angina pectoris: Secondary | ICD-10-CM | POA: Diagnosis present

## 2018-11-24 LAB — CREATININE, SERUM
Creatinine, Ser: 1.48 mg/dL — ABNORMAL HIGH (ref 0.61–1.24)
GFR calc Af Amer: 54 mL/min — ABNORMAL LOW (ref >60)
GFR calc non Af Amer: 46 mL/min — ABNORMAL LOW (ref >60)

## 2018-11-24 LAB — CBC
HCT: 43.2 % (ref 39.0–52.0)
Hemoglobin: 14.8 g/dL (ref 13.0–17.0)
MCH: 30 pg (ref 26.0–34.0)
MCHC: 34.3 g/dL (ref 30.0–36.0)
MCV: 87.4 fL (ref 80.0–100.0)
Platelets: 179 10*3/uL (ref 150–400)
RBC: 4.94 MIL/uL (ref 4.22–5.81)
RDW: 13.8 % (ref 11.5–15.5)
WBC: 8.5 10*3/uL (ref 4.0–10.5)
nRBC: 0 % (ref 0.0–0.2)

## 2018-11-24 LAB — BASIC METABOLIC PANEL
Anion gap: 9 (ref 5–15)
BUN: 25 mg/dL — ABNORMAL HIGH (ref 8–23)
CO2: 23 mmol/L (ref 22–32)
Calcium: 9.3 mg/dL (ref 8.9–10.3)
Chloride: 107 mmol/L (ref 98–111)
Creatinine, Ser: 1.41 mg/dL — ABNORMAL HIGH (ref 0.61–1.24)
GFR calc Af Amer: 57 mL/min — ABNORMAL LOW (ref >60)
GFR calc non Af Amer: 49 mL/min — ABNORMAL LOW (ref >60)
Glucose, Bld: 102 mg/dL — ABNORMAL HIGH (ref 70–99)
Potassium: 4 mmol/L (ref 3.5–5.1)
Sodium: 139 mmol/L (ref 135–145)

## 2018-11-24 LAB — GLUCOSE, CAPILLARY: Glucose-Capillary: 119 mg/dL — ABNORMAL HIGH (ref 70–99)

## 2018-11-24 LAB — TROPONIN I (HIGH SENSITIVITY)
Troponin I (High Sensitivity): 5 ng/L (ref <18)
Troponin I (High Sensitivity): 5 ng/L (ref <18)

## 2018-11-24 MED ORDER — ASPIRIN EC 81 MG PO TBEC
81.0000 mg | DELAYED_RELEASE_TABLET | Freq: Every day | ORAL | Status: DC
  Administered 2018-11-25: 12:00:00 81 mg via ORAL
  Filled 2018-11-24: qty 1

## 2018-11-24 MED ORDER — HYDROCHLOROTHIAZIDE 12.5 MG PO CAPS
12.5000 mg | ORAL_CAPSULE | Freq: Every day | ORAL | Status: DC
  Administered 2018-11-25: 12.5 mg via ORAL
  Filled 2018-11-24: qty 1

## 2018-11-24 MED ORDER — INSULIN ASPART 100 UNIT/ML ~~LOC~~ SOLN
0.0000 [IU] | Freq: Three times a day (TID) | SUBCUTANEOUS | Status: DC
  Administered 2018-11-25: 2 [IU] via SUBCUTANEOUS
  Filled 2018-11-24: qty 1

## 2018-11-24 MED ORDER — ASPIRIN 81 MG PO CHEW
243.0000 mg | CHEWABLE_TABLET | Freq: Once | ORAL | Status: AC
  Administered 2018-11-24: 17:00:00 243 mg via ORAL
  Filled 2018-11-24: qty 3

## 2018-11-24 MED ORDER — ALPRAZOLAM 0.25 MG PO TABS: 0.2500 mg | ORAL_TABLET | Freq: Two times a day (BID) | ORAL | Status: DC | PRN

## 2018-11-24 MED ORDER — FINASTERIDE 5 MG PO TABS
5.0000 mg | ORAL_TABLET | Freq: Every evening | ORAL | Status: DC
  Administered 2018-11-24: 19:00:00 5 mg via ORAL
  Filled 2018-11-24: qty 1

## 2018-11-24 MED ORDER — ONDANSETRON HCL 4 MG/2ML IJ SOLN: 4.0000 mg | Freq: Four times a day (QID) | INTRAMUSCULAR | Status: DC | PRN

## 2018-11-24 MED ORDER — HEPARIN SODIUM (PORCINE) 5000 UNIT/ML IJ SOLN
5000.0000 [IU] | Freq: Three times a day (TID) | INTRAMUSCULAR | Status: DC
  Filled 2018-11-24 (×2): qty 1

## 2018-11-24 MED ORDER — PANTOPRAZOLE SODIUM 40 MG PO TBEC
40.0000 mg | DELAYED_RELEASE_TABLET | Freq: Every day | ORAL | Status: DC
  Administered 2018-11-25: 40 mg via ORAL
  Filled 2018-11-24: qty 1

## 2018-11-24 MED ORDER — ATORVASTATIN CALCIUM 20 MG PO TABS
40.0000 mg | ORAL_TABLET | Freq: Every day | ORAL | Status: DC
  Administered 2018-11-24 – 2018-11-25 (×2): 40 mg via ORAL
  Filled 2018-11-24 (×2): qty 2

## 2018-11-24 MED ORDER — ZOLPIDEM TARTRATE 5 MG PO TABS: 5.0000 mg | ORAL_TABLET | Freq: Every evening | ORAL | Status: DC | PRN

## 2018-11-24 MED ORDER — TAMSULOSIN HCL 0.4 MG PO CAPS: 0.4000 mg | ORAL_CAPSULE | Freq: Every day | ORAL | Status: DC

## 2018-11-24 MED ORDER — AMLODIPINE BESYLATE 5 MG PO TABS
5.0000 mg | ORAL_TABLET | Freq: Every day | ORAL | Status: DC
  Administered 2018-11-25: 5 mg via ORAL
  Filled 2018-11-24: qty 1

## 2018-11-24 MED ORDER — RAMIPRIL 10 MG PO CAPS
10.0000 mg | ORAL_CAPSULE | Freq: Every day | ORAL | Status: DC
  Administered 2018-11-25: 10 mg via ORAL
  Filled 2018-11-24: qty 1

## 2018-11-24 MED ORDER — METOPROLOL TARTRATE 50 MG PO TABS
50.0000 mg | ORAL_TABLET | Freq: Every day | ORAL | Status: DC
  Filled 2018-11-24: qty 1

## 2018-11-24 MED ORDER — ACETAMINOPHEN 325 MG PO TABS: 650.0000 mg | ORAL_TABLET | ORAL | Status: DC | PRN

## 2018-11-24 MED ORDER — METHOCARBAMOL 750 MG PO TABS
750.0000 mg | ORAL_TABLET | Freq: Four times a day (QID) | ORAL | Status: DC
  Administered 2018-11-24 – 2018-11-25 (×3): 750 mg via ORAL
  Filled 2018-11-24 (×6): qty 1

## 2018-11-24 MED ORDER — SODIUM CHLORIDE 0.9% FLUSH
3.0000 mL | Freq: Once | INTRAVENOUS | Status: AC
  Administered 2018-11-24: 3 mL via INTRAVENOUS

## 2018-11-24 MED ORDER — ERYTHROMYCIN 5 MG/GM OP OINT: 1.0000 "application " | TOPICAL_OINTMENT | Freq: Three times a day (TID) | OPHTHALMIC | Status: DC

## 2018-11-24 MED ORDER — METOPROLOL TARTRATE 50 MG PO TABS: 75.0000 mg | ORAL_TABLET | Freq: Every morning | ORAL | Status: DC

## 2018-11-24 MED ORDER — NITROGLYCERIN 0.4 MG SL SUBL: 0.4000 mg | SUBLINGUAL_TABLET | SUBLINGUAL | Status: DC | PRN

## 2018-11-24 MED ORDER — INSULIN ASPART 100 UNIT/ML ~~LOC~~ SOLN: 0.0000 [IU] | Freq: Every day | SUBCUTANEOUS | Status: DC

## 2018-11-24 NOTE — Telephone Encounter (Signed)
Called pt to complete a telephonic AWV and pt states he just tried to get in touch with our office 30 minutes ago and was unable to. Advised pt we were closed over lunch. Pt states he called because he was having chest pains. Pt states the pain was at a level 10 and it felt achy all over his chest. Pt states it felt like it did the first heart attack he had but this time his left arm was not in pain. Pt states he also placed a call to his cardiologist who did not answer and has not called him back. Pt declined any other s/s (n/v/d, SOB, pain elsewhere, lightheadness, blurred vision). Pt states that he is ok now because he took a Nitrostat tablet and it got rid of the pain. Advised pt he needed to go to the ER to be evaluated. Inquired about a ride and pt states he wife could take him. Cancelled AWV. Pt is heading to the ER now. FYI!

## 2018-11-24 NOTE — Telephone Encounter (Signed)
Thank you. Agree with your recommendation.

## 2018-11-24 NOTE — ED Notes (Signed)
ED TO INPATIENT HANDOFF REPORT  ED Nurse Name and Phone #:  [place] Name/Age/Gender Joycelyn Man 73 y.o. male Room/Bed: ED01HA/ED01HA  Code Status   Code Status: Not on file  Home/SNF/Other Home Patient oriented to: self, place, time and situation Is this baseline? Yes   Triage Complete: Triage complete  Chief Complaint chest pain  Triage Note First RN Note: pt presents to ED via POV with c/o L sided CP approx 1.5 hrs ago with CP "so severe that [he] couldn't hardly breathe". Pt states took 1 SL nitro at home PTA, states now he feels better.   Pt started with central chest pain 2 hours ago and he took one NTG and pain resolved.  Denies any other associated sx along with CP.  + cardiac hx. Pt was walking out of restaurant when pain started.   Allergies Allergies  Allergen Reactions  . Codeine Nausea Only    Level of Care/Admitting Diagnosis ED Disposition    ED Disposition Condition [facility]: [place] [100120]  Level of Care: Telemetry [5]  Covid Evaluation: Asymptomatic Screening Protocol (No Symptoms)  Diagnosis: Chest pain [101751]  Admitting Physician: Demetrios Loll [[id number]]  Attending Physician: Demetrios Loll [phone]  PT Class (Do Not Modify): Observation [104]  PT Acc Code (Do Not Modify): Observation [10022]       B Medical/Surgery History Past Medical History:  Diagnosis Date  . Chronic kidney disease    kidney stones  . Coronary artery disease    2 stents   . Diabetes mellitus without complication ([place])   . GERD (gastroesophageal reflux disease)   . Hypercholesteremia   . Hypertension   . Myocardial infarction [facility]) 2013   Past Surgical History:  Procedure Laterality Date  . CARDIAC CATHETERIZATION  2013   2 stents  . COLONOSCOPY  04/27/05  . COLONOSCOPY WITH PROPOFOL N/A 06/08/2015   Procedure: COLONOSCOPY WITH PROPOFOL;  Surgeon: Robert Bellow, MD;  Location: [facility] ENDOSCOPY;  Service:  Endoscopy;  Laterality: N/A;  . CYSTOSCOPY WITH STENT PLACEMENT Right 09/16/2016   Procedure: CYSTOSCOPY WITH STENT PLACEMENT;  Surgeon: Royston Cowper, MD;  Location: [facility] ORS;  Service: Urology;  Laterality: Right;  . EXTRACORPOREAL SHOCK WAVE LITHOTRIPSY Left 11/04/2014   has had 2 previous lithotripsies  . EXTRACORPOREAL SHOCK WAVE LITHOTRIPSY Left 03/24/2015   Procedure: EXTRACORPOREAL SHOCK WAVE LITHOTRIPSY (ESWL);  Surgeon: Royston Cowper, MD;  Location: [facility] ORS;  Service: Urology;  Laterality: Left;  . EXTRACORPOREAL SHOCK WAVE LITHOTRIPSY Right 10/04/2016   Procedure: EXTRACORPOREAL SHOCK WAVE LITHOTRIPSY (ESWL);  Surgeon: Royston Cowper, MD;  Location: [facility] ORS;  Service: Urology;  Laterality: Right;     A IV Location/Drains/Wounds Patient Lines/Drains/Airways Status   Active Line/Drains/Airways    Name:   Placement date:   Placement time:   Site:   Days:   Peripheral IV 11/24/18 Left Hand   11/24/18    1715    Hand   less than 1   Ureteral Drain/Stent Right ureter 6 Fr.   09/16/16    1204    Right ureter   799   Wound / Incision (Open or Dehisced) 03/24/15 Other (Comment) Back Left   03/24/15    1344    Back   1341          Intake/Output Last 24 hours No intake or output data in the 24 hours ending 11/24/18 1748  Labs/Imaging Results for orders placed or  performed during the hospital encounter of 11/24/18 (from the past 48 hour(s))  Basic metabolic panel     Status: Abnormal   Collection Time: 11/24/18  3:22 PM  Result Value Ref Range   Sodium 139 135 - 145 mmol/L   Potassium 4.0 3.5 - 5.1 mmol/L   Chloride 107 98 - 111 mmol/L   CO2 23 22 - 32 mmol/L   Glucose, Bld 102 (H) 70 - 99 mg/dL   BUN 25 (H) 8 - 23 mg/dL   Creatinine, Ser 1.41 (H) 0.61 - 1.24 mg/dL   Calcium 9.3 8.9 - 10.3 mg/dL   GFR calc non Af Amer 49 (L) >60 mL/min   GFR calc Af Amer 57 (L) >60 mL/min   Anion gap 9 5 - 15    Comment: Performed at [facility], [address]  [place], [place] [address]  CBC     Status: None   Collection Time: 11/24/18  3:22 PM  Result Value Ref Range   WBC 8.5 4.0 - 10.5 K/uL   RBC 4.94 4.22 - 5.81 MIL/uL   Hemoglobin 14.8 13.0 - 17.0 g/dL   HCT 43.2 39.0 - 52.0 %   MCV 87.4 80.0 - 100.0 fL   MCH 30.0 26.0 - 34.0 pg   MCHC 34.3 30.0 - 36.0 g/dL   RDW 13.8 11.5 - 15.5 %   Platelets 179 150 - 400 K/uL   nRBC 0.0 0.0 - 0.2 %    Comment: Performed at [facility], [address] [place], [place] [address]  Troponin I (High Sensitivity)     Status: None   Collection Time: 11/24/18  3:22 PM  Result Value Ref Range   Troponin I (High Sensitivity) 5 <18 ng/L    Comment: (NOTE) Elevated high sensitivity troponin I (hsTnI) values and significant  changes across serial measurements may suggest ACS but many other  chronic and acute conditions are known to elevate hsTnI results.  Refer to the "Links" section for chest pain algorithms and additional  guidance. Performed at [facility], [address], [place], [place] [address]    Dg Chest 2 View  Result Date: 11/24/2018 CLINICAL DATA:  Central chest pain for 2 hours. History of myocardial infarction, hypertension and diabetes. EXAM: CHEST - 2 VIEW COMPARISON:  Radiographs 12/10/2010.  Abdominal CT 04/18/2016. FINDINGS: The heart size and mediastinal contours are stable. There are coronary artery calcifications versus stents. There is chronic obstructive pulmonary disease with scattered pleuroparenchymal nodularity, stable. No confluent airspace opacity, pleural effusion or pneumothorax. There is an old fracture of the distal left clavicle. No acute osseous findings are evident. IMPRESSION: Stable chronic lung disease with emphysema. No acute cardiopulmonary process. Electronically Signed   By: Richardean Sale M.D.   On: 11/24/2018 16:10    Pending Labs Unresulted Labs (From admission, onward)    Start     Ordered   11/24/18 1607  SARS CORONAVIRUS 2 (TAT 6-24 HRS)  Nasopharyngeal Nasopharyngeal Swab  (Asymptomatic/Tier 2 Patients Labs)  Once,   STAT    Question Answer Comment  Is this test for diagnosis or screening Screening   Symptomatic for COVID-19 as defined by CDC No   Hospitalized for COVID-19 No   Admitted to ICU for COVID-19 No   Previously tested for COVID-19 No   Resident in a congregate (group) care setting No   Employed in healthcare setting No      11/24/18 1606   Signed and Held  Creatinine, serum  (  heparin)  Once,   R    Comments: Baseline for heparin therapy IF NOT ALREADY DRAWN.    Signed and Held   Signed and Held  Lipid panel  Tomorrow morning,   R     Signed and Held          Vitals/Pain Today's Vitals   11/24/18 1535 11/24/18 1650 11/24/18 1701 11/24/18 1739  BP:   140/72 117/64  Pulse:  (!) 56  (!) 54  Resp:  18  18  SpO2:  97%  98%  PainSc: 0-No pain 0-No pain 0-No pain     Isolation Precautions No active isolations  Medications Medications  sodium chloride flush (NS) 0.9 % injection 3 mL (has no administration in time range)  aspirin chewable tablet 243 mg (243 mg Oral Given 11/24/18 1630)    Mobility walks Low fall risk   Focused Assessments n/a   R Recommendations: See Admitting Provider Note  Report given to:   Additional Notes: n/a

## 2018-11-24 NOTE — H&P (Signed)
[place] [facility] [place] NAME: Luke Rojas    MR#:  [id number]  DATE OF BIRTH:  Jul 25, 1945  DATE OF ADMISSION:  11/24/2018  PRIMARY CARE PHYSICIAN: Margo Common, PA   REQUESTING/REFERRING PHYSICIAN: Dr. Charna Archer.  CHIEF COMPLAINT:   Chief Complaint  Patient presents with  . Chest Pain   Chest pain today. HISTORY OF PRESENT ILLNESS:  Luke Rojas  is a 73 y.o. male with a known history of hypertension, diabetes, hyperlipidemia, CKD, CAD and GERD.  The patient presents the ED with above chief complaint.  He started to have chest pain this afternoon, which is on the left side, sharp without radiation, last about 30 minutes, improved with nitroglycerin.  The first troponin is unremarkable, chest x-ray is unremarkable.  He is treated with aspirin 1 dose in the ED.  ED physician request admission.  PAST MEDICAL HISTORY:   Past Medical History:  Diagnosis Date  . Chronic kidney disease    kidney stones  . Coronary artery disease    2 stents   . Diabetes mellitus without complication ([place])   . GERD (gastroesophageal reflux disease)   . Hypercholesteremia   . Hypertension   . Myocardial infarction [facility]) 2013    PAST SURGICAL HISTORY:   Past Surgical History:  Procedure Laterality Date  . CARDIAC CATHETERIZATION  2013   2 stents  . COLONOSCOPY  04/27/05  . COLONOSCOPY WITH PROPOFOL N/A 06/08/2015   Procedure: COLONOSCOPY WITH PROPOFOL;  Surgeon: Robert Bellow, MD;  Location: [facility] ENDOSCOPY;  Service: Endoscopy;  Laterality: N/A;  . CYSTOSCOPY WITH STENT PLACEMENT Right 09/16/2016   Procedure: CYSTOSCOPY WITH STENT PLACEMENT;  Surgeon: Royston Cowper, MD;  Location: [facility] ORS;  Service: Urology;  Laterality: Right;  . EXTRACORPOREAL SHOCK WAVE LITHOTRIPSY Left 11/04/2014   has had 2 previous lithotripsies  . EXTRACORPOREAL SHOCK WAVE LITHOTRIPSY Left 03/24/2015   Procedure: EXTRACORPOREAL SHOCK WAVE LITHOTRIPSY (ESWL);   Surgeon: Royston Cowper, MD;  Location: [facility] ORS;  Service: Urology;  Laterality: Left;  . EXTRACORPOREAL SHOCK WAVE LITHOTRIPSY Right 10/04/2016   Procedure: EXTRACORPOREAL SHOCK WAVE LITHOTRIPSY (ESWL);  Surgeon: Royston Cowper, MD;  Location: [facility] ORS;  Service: Urology;  Laterality: Right;    SOCIAL HISTORY:   Social History   Tobacco Use  . Smoking status: Former Smoker    Packs/day: 1.00    Years: 30.00    Pack years: 30.00    Types: Cigarettes    Quit date: 03/05/1984    Years since quitting: 34.7  . Smokeless tobacco: Current User    Types: Chew  Substance Use Topics  . Alcohol use: No    Alcohol/week: 0.0 standard drinks    FAMILY HISTORY:   Family History  Problem Relation Age of Onset  . Pulmonary embolism Mother   . Transient ischemic attack Mother   . Diabetes Mother   . Pancreatic cancer Father   . Hypertension Father   . Diabetes Father   . Cirrhosis Brother     DRUG ALLERGIES:   Allergies  Allergen Reactions  . Codeine Nausea Only    REVIEW OF SYSTEMS:   Review of Systems  Constitutional: Negative for chills, fever and malaise/fatigue.  HENT: Negative for sore throat.   Eyes: Negative for blurred vision and double vision.  Respiratory: Negative for cough, hemoptysis, shortness of breath, wheezing and stridor.   Cardiovascular: Positive for chest pain. Negative for palpitations, orthopnea and leg swelling.  Gastrointestinal: Negative for  abdominal pain, blood in stool, diarrhea, melena, nausea and vomiting.  Genitourinary: Negative for dysuria, flank pain and hematuria.  Musculoskeletal: Negative for back pain and joint pain.  Skin: Negative for rash.  Neurological: Negative for dizziness, sensory change, focal weakness, seizures, loss of consciousness, weakness and headaches.  Endo/Heme/Allergies: Negative for polydipsia.  Psychiatric/Behavioral: Negative for depression. The patient is not nervous/anxious.     MEDICATIONS AT HOME:   Prior  to Admission medications   Medication Sig Start Date End Date Taking? Authorizing Provider  amLODipine (NORVASC) 5 MG tablet Take by mouth. 03/10/18 03/10/19  [provider]  aspirin EC 81 MG tablet Take 81 mg by mouth daily.    [provider]  atorvastatin (LIPITOR) 40 MG tablet Take 1 tablet by mouth daily. 09/29/11   [provider]  co-enzyme Q-10 30 MG capsule Take 30 mg by mouth 3 (three) times daily.    [provider]  diclofenac sodium (VOLTAREN) 1 % GEL Apply 2 g topically 4 (four) times daily. Patient not taking: Reported on 09/08/2018 07/08/17   Chrismon, Vickki Muff, PA  erythromycin ophthalmic ointment Place 1 application into the right eye 3 (three) times daily. Apply 1/2" ribbon inside lower lid. 09/08/18   Chrismon, Vickki Muff, PA  finasteride (PROSCAR) 5 MG tablet Take 5 mg by mouth every evening. 06/26/16   [provider]  glucose blood test strip Test fasting blood glucose each morning before breakfast. 08/29/17   Chrismon, Vickki Muff, PA  hydrochlorothiazide (MICROZIDE) 12.5 MG capsule  03/10/18   [provider]  Lancets [facility] ULTRASOFT) lancets Test fasting glucose level each morning before breakfast. 07/08/17   Chrismon, Vickki Muff, PA  meloxicam (MOBIC) 15 MG tablet TAKE 1 TABLET BY MOUTH ONCE DAILY 10/27/18   Chrismon, Vickki Muff, PA  methocarbamol (ROBAXIN) 750 MG tablet Take 1 tablet (750 mg total) by mouth 4 (four) times daily. 06/13/17   Chrismon, Vickki Muff, PA  metoprolol tartrate (LOPRESSOR) 50 MG tablet TAKE 1.5 TABLETS (75 MG TOTAL) BY MOUTH EACH MORNING AND 1 TABLET (50MG  TOTAL) BY MOUTH AT NIGHT. 10/01/18   Chrismon, Vickki Muff, PA  MULTIPLE VITAMIN PO Take 1 tablet by mouth daily.    [provider]  nitroGLYCERIN (NITROSTAT) 0.4 MG SL tablet Place 0.4 mg under the tongue every 5 (five) minutes as needed. Reported on 03/24/2015 09/29/11   [provider]  Omega-3 Fatty Acids (FISH OIL) 1200 MG CAPS Take 1 capsule by  mouth 2 (two) times daily.    [provider]  omeprazole (PRILOSEC) 20 MG capsule Take 20 mg by mouth daily.  01/26/15   [provider]  ramipril (ALTACE) 10 MG capsule Take 1 capsule by mouth daily. 07/08/14   [provider]  tamsulosin (FLOMAX) 0.4 MG CAPS capsule Take 1 capsule (0.4 mg total) by mouth daily after breakfast. 04/18/16   Gregor Hams, MD      VITAL SIGNS:  There were no vitals taken for this visit.  PHYSICAL EXAMINATION:  Physical Exam  GENERAL:  73 y.o.-year-old patient lying in the bed with no acute distress.  Obesity. EYES: Pupils equal, round, reactive to light and accommodation. No scleral icterus. Extraocular muscles intact.  HEENT: Head atraumatic, normocephalic. NECK:  Supple, no jugular venous distention. No thyroid enlargement, no tenderness.  LUNGS: Normal breath sounds bilaterally, no wheezing, rales,rhonchi or crepitation. No use of accessory muscles of respiration.  CARDIOVASCULAR: S1, S2 normal. No murmurs, rubs, or gallops.  ABDOMEN: Soft, nontender,  nondistended. Bowel sounds present. No organomegaly or mass.  EXTREMITIES: No pedal edema, cyanosis, or clubbing.  NEUROLOGIC: Cranial nerves II through XII are intact. Muscle strength 5/5 in all extremities. Sensation intact. Gait not checked.  PSYCHIATRIC: The patient is alert and oriented x 3.  SKIN: No obvious rash, lesion, or ulcer.   LABORATORY PANEL:   CBC Recent Labs  Lab 11/24/18 1522  WBC 8.5  HGB 14.8  HCT 43.2  PLT 179   ------------------------------------------------------------------------------------------------------------------  Chemistries  Recent Labs  Lab 11/24/18 1522  NA 139  K 4.0  CL 107  CO2 23  GLUCOSE 102*  BUN 25*  CREATININE 1.41*  CALCIUM 9.3   ------------------------------------------------------------------------------------------------------------------  Cardiac Enzymes No results for input(s): TROPONINI in the last  168 hours. ------------------------------------------------------------------------------------------------------------------  RADIOLOGY:  Dg Chest 2 View  Result Date: 11/24/2018 CLINICAL DATA:  Central chest pain for 2 hours. History of myocardial infarction, hypertension and diabetes. EXAM: CHEST - 2 VIEW COMPARISON:  Radiographs 12/10/2010.  Abdominal CT 04/18/2016. FINDINGS: The heart size and mediastinal contours are stable. There are coronary artery calcifications versus stents. There is chronic obstructive pulmonary disease with scattered pleuroparenchymal nodularity, stable. No confluent airspace opacity, pleural effusion or pneumothorax. There is an old fracture of the distal left clavicle. No acute osseous findings are evident. IMPRESSION: Stable chronic lung disease with emphysema. No acute cardiopulmonary process. Electronically Signed   By: Richardean Sale M.D.   On: 11/24/2018 16:10      IMPRESSION AND PLAN:   Chest pain, rule out ACS.  History of CAD. The patient will be placed for observation. Continue aspirin, Lipitor, follow-up with troponin level, stress test in a.m.  Hypertension.  Continue home hypertension medication. Diabetes.  Start sliding scale. CKD stage III.  Stable.  All the records are reviewed and case discussed with ED provider. Management plans discussed with the patient, family and they are in agreement.  CODE STATUS: Full code.  TOTAL TIME TAKING CARE OF THIS PATIENT:  45 minutes.    Demetrios Loll M.D on 11/24/2018 at 4:36 PM  Between 7am to 6pm - Pager - [phone]  After 6pm go to [email or web address] - Proofreader  [facility]  Office  [phone]  CC: Primary care physician; Margo Common, PA   Note: This dictation was prepared with Dragon dictation along with smaller phrase technology. Any transcriptional errors that result from this process are unin

## 2018-11-24 NOTE — ED Triage Notes (Addendum)
Pt started with central chest pain 2 hours ago and he took one NTG and pain resolved.  Denies any other associated sx along with CP.  + cardiac hx. Pt was walking out of restaurant when pain started.

## 2018-11-24 NOTE — ED Provider Notes (Signed)
[facility] Emergency Department Provider Note   ____________________________________________   First MD Initiated Contact with Patient 11/24/18 1547     (approximate)  I have reviewed the triage vital signs and the nursing notes.   HISTORY  Chief Complaint Chest Pain    HPI Luke Rojas is a 73 y.o. male 73 year old male with past medical history of CAD, hypertension, diabetes, CKD who presents to the ED complaining of chest pain.  Patient reports he had an episode of chest pressure approximately 2 hours ago while he was walking from a restaurant to his car.  Pain seemed to persist over about 20 minutes before improving with nitroglycerin and rest.  It was associated with some difficulty catching his breath, which is also since resolved.  He states he is otherwise been feeling well recently with no fevers, cough, vomiting, leg swelling or pain.        Past Medical History:  Diagnosis Date  . Chronic kidney disease    kidney stones  . Coronary artery disease    2 stents   . Diabetes mellitus without complication ([place])   . GERD (gastroesophageal reflux disease)   . Hypercholesteremia   . Hypertension   . Myocardial infarction [facility]) 2013    Patient Active Problem List   Diagnosis Date Noted  . Coronary artery disease involving native coronary artery without angina pectoris 08/05/2017  . Lumbar radiculopathy (R-sided) L4 07/04/2017  . 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 Lumbar facet arthropathy 07/04/2017  . 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 Foraminal stenosis of thoracic region 07/04/2017  . Chronic pain syndrome 07/04/2017  . Encounter for screening colonoscopy 05/04/2015  . Arthritis 10/08/2014  . H/O acute myocardial infarction 10/08/2014  . Acid reflux 10/08/2014  . HLD (hyperlipidemia) 10/08/2014  . BP (high blood pressure) 10/08/2014  . Hyperlipidemia 12/05/2007  . Benign neoplasm of skin  07/19/2006  . Essential (primary) hypertension 11/15/2005  . Decreased potassium in the blood 11/15/2005    Past Surgical History:  Procedure Laterality Date  . CARDIAC CATHETERIZATION  2013   2 stents  . COLONOSCOPY  04/27/05  . COLONOSCOPY WITH PROPOFOL N/A 06/08/2015   Procedure: COLONOSCOPY WITH PROPOFOL;  Surgeon: Robert Bellow, MD;  Location: [facility];  Service: Endoscopy;  Laterality: N/A;  . CYSTOSCOPY WITH STENT PLACEMENT Right 09/16/2016   Procedure: CYSTOSCOPY WITH STENT PLACEMENT;  Surgeon: Royston Cowper, MD;  Location: [facility] ORS;  Service: Urology;  Laterality: Right;  . EXTRACORPOREAL SHOCK WAVE LITHOTRIPSY Left 11/04/2014   has had 2 previous lithotripsies  . EXTRACORPOREAL SHOCK WAVE LITHOTRIPSY Left 03/24/2015   Procedure: EXTRACORPOREAL SHOCK WAVE LITHOTRIPSY (ESWL);  Surgeon: Royston Cowper, MD;  Location: [facility] ORS;  Service: Urology;  Laterality: Left;  . EXTRACORPOREAL SHOCK WAVE LITHOTRIPSY Right 10/04/2016   Procedure: EXTRACORPOREAL SHOCK WAVE LITHOTRIPSY (ESWL);  Surgeon: Royston Cowper, MD;  Location: [facility] ORS;  Service: Urology;  Laterality: Right;    Prior to Admission medications   Medication Sig Start Date End Date Taking? Authorizing Provider  amLODipine (NORVASC) 5 MG tablet Take by mouth. 03/10/18 03/10/19  [provider]  aspirin EC 81 MG tablet Take 81 mg by mouth daily.    [provider]  atorvastatin (LIPITOR) 40 MG tablet Take 1 tablet by mouth daily. 09/29/11   [provider]  co-enzyme Q-10 30 MG capsule Take 30 mg by mouth 3 (three) times daily.    [provider]  diclofenac sodium (VOLTAREN) 1 % GEL Apply 2 g topically 4 (four) times daily. Patient not taking: Reported on 09/08/2018 07/08/17   Chrismon, Vickki Muff, PA  erythromycin ophthalmic ointment Place 1 application into the right eye 3 (three) times daily. Apply 1/2" ribbon inside lower lid. 09/08/18   Chrismon, Vickki Muff, PA  finasteride (PROSCAR) 5 MG  tablet Take 5 mg by mouth every evening. 06/26/16   [provider]  glucose blood test strip Test fasting blood glucose each morning before breakfast. 08/29/17   Chrismon, Vickki Muff, PA  hydrochlorothiazide (MICROZIDE) 12.5 MG capsule  03/10/18   [provider]  Lancets [facility] ULTRASOFT) lancets Test fasting glucose level each morning before breakfast. 07/08/17   Chrismon, Vickki Muff, PA  meloxicam (MOBIC) 15 MG tablet TAKE 1 TABLET BY MOUTH ONCE DAILY 10/27/18   Chrismon, Vickki Muff, PA  methocarbamol (ROBAXIN) 750 MG tablet Take 1 tablet (750 mg total) by mouth 4 (four) times daily. 06/13/17   Chrismon, Vickki Muff, PA  metoprolol tartrate (LOPRESSOR) 50 MG tablet TAKE 1.5 TABLETS (75 MG TOTAL) BY MOUTH EACH MORNING AND 1 TABLET (50MG  TOTAL) BY MOUTH AT NIGHT. 10/01/18   Chrismon, Vickki Muff, PA  MULTIPLE VITAMIN PO Take 1 tablet by mouth daily.    [provider]  nitroGLYCERIN (NITROSTAT) 0.4 MG SL tablet Place 0.4 mg under the tongue every 5 (five) minutes as needed. Reported on 03/24/2015 09/29/11   [provider]  Omega-3 Fatty Acids (FISH OIL) 1200 MG CAPS Take 1 capsule by mouth 2 (two) times daily.    [provider]  omeprazole (PRILOSEC) 20 MG capsule Take 20 mg by mouth daily.  01/26/15   [provider]  ramipril (ALTACE) 10 MG capsule Take 1 capsule by mouth daily. 07/08/14   [provider]  tamsulosin (FLOMAX) 0.4 MG CAPS capsule Take 1 capsule (0.4 mg total) by mouth daily after breakfast. 04/18/16   Gregor Hams, MD    Allergies Codeine  Family History  Problem Relation Age of Onset  . Pulmonary embolism Mother   . Transient ischemic attack Mother   . Diabetes Mother   . Pancreatic cancer Father   . Hypertension Father   . Diabetes Father   . Cirrhosis Brother     Social History Social History   Tobacco Use  . Smoking status: Former Smoker    Packs/day: 1.00    Years: 30.00    Pack years: 30.00    Types:  Cigarettes    Quit date: 03/05/1984    Years since quitting: 34.7  . Smokeless tobacco: Current User    Types: Chew  Substance Use Topics  . Alcohol use: No    Alcohol/week: 0.0 standard drinks  . Drug use: No    Review of Systems  Constitutional: No fever/chills Eyes: No visual changes. ENT: No sore throat. Cardiovascular: Positive for chest pain. Respiratory: Positive for shortness of breath. Gastrointestinal: No abdominal pain.  No nausea, no vomiting.  No diarrhea.  No constipation. Genitourinary: Negative for dysuria. Musculoskeletal: Negative for back pain. Skin: Negative for rash. Neurological: Negative for headaches, focal weakness or numbness.  ____________________________________________   PHYSICAL EXAM:  VITAL SIGNS: ED Triage Vitals [11/24/18 1535]  Enc Vitals Group     BP      Pulse      Resp      Temp      Temp src      SpO2      Weight  Height      Head Circumference      Peak Flow      Pain Score 0     Pain Loc      Pain Edu?      Excl. in [place]?     Constitutional: Alert and oriented. Eyes: Conjunctivae are normal. Head: Atraumatic. Nose: No congestion/rhinnorhea. Mouth/Throat: Mucous membranes are moist. Neck: Normal ROM Cardiovascular: Normal rate, regular rhythm. Grossly normal heart sounds. Respiratory: Normal respiratory effort.  No retractions. Lungs CTAB. Gastrointestinal: Soft and nontender. No distention. Genitourinary: deferred Musculoskeletal: No lower extremity tenderness nor edema. Neurologic:  Normal speech and language. No gross focal neurologic deficits are appreciated. Skin:  Skin is warm, dry and intact. No rash noted. Psychiatric: Mood and affect are normal. Speech and behavior are normal.  ____________________________________________   LABS (all labs ordered are listed, but only abnormal results are displayed)  Labs Reviewed  BASIC METABOLIC PANEL - Abnormal; Notable for the following components:      Result  Value   Glucose, Bld 102 (*)    BUN 25 (*)    Creatinine, Ser 1.41 (*)    GFR calc non Af Amer 49 (*)    GFR calc Af Amer 57 (*)    All other components within normal limits  SARS CORONAVIRUS 2 (TAT 6-24 HRS)  CBC  TROPONIN I (HIGH SENSITIVITY)   ____________________________________________  EKG  ED ECG REPORT I, Blake Divine, the attending physician, personally viewed and interpreted this ECG.   Date: 11/24/2018  EKG Time: 15:18  Rate: 62  Rhythm: normal sinus rhythm  Axis: Normal  Intervals:none  ST&T Change: None   PROCEDURES  Procedure(s) performed (including Critical Care):  Procedures   ____________________________________________   INITIAL IMPRESSION / ASSESSMENT AND PLAN / ED COURSE       73 year old male with history of CAD and stenting multiple years ago presents to the ED with episode of chest pressure with exertion lasting for about 20 minutes and improving with rest and nitroglycerin.  He is chest pain-free on arrival to the ED, initial EKG without any acute ischemic changes.  Initial troponin is also negative.  History is concerning for cardiac etiology of patient's symptoms and given his risk factors, I feel that the best course of action would be to admit for observation and further cardiac work-up.  Will give full aspirin load, case discussed with hospitalist, who accepts patient for admission.      ____________________________________________   FINAL CLINICAL IMPRESSION(S) / ED DIAGNOSES  Final diagnoses:  Chest pain, unspecified type  Coronary artery disease involving native coronary artery of native heart without angina pectoris  Essential (primary) hypertension  Hyperlipidemia, unspecified hyperlipidemia type     ED Discharge Orders    None       Note:  This document was prepared using Dragon voice recognition software and may include unintentional dictation errors.   Blake Divine, MD 11/24/18 [phone]

## 2018-11-24 NOTE — ED Notes (Signed)
Collected Lav top and Green top, sent to lab pending orders.

## 2018-11-24 NOTE — ED Triage Notes (Signed)
First RN Note: pt presents to ED via POV with c/o L sided CP approx 1.5 hrs ago with CP "so severe that [he] couldn't hardly breathe". Pt states took 1 SL nitro at home PTA, states now he feels better.

## 2018-11-25 ENCOUNTER — Observation Stay: Payer: Medicare Other

## 2018-11-25 ENCOUNTER — Encounter: Payer: Self-pay | Admitting: Radiology

## 2018-11-25 LAB — LIPID PANEL
Cholesterol: 101 mg/dL (ref 0–200)
HDL: 21 mg/dL — ABNORMAL LOW (ref >40)
LDL Cholesterol: 21 mg/dL (ref 0–99)
Total CHOL/HDL Ratio: 4.8 RATIO
Triglycerides: 294 mg/dL — ABNORMAL HIGH (ref <150)
VLDL: 59 mg/dL — ABNORMAL HIGH (ref 0–40)

## 2018-11-25 LAB — SARS CORONAVIRUS 2 (TAT 6-24 HRS): SARS Coronavirus 2: NEGATIVE

## 2018-11-25 LAB — GLUCOSE, CAPILLARY
Glucose-Capillary: 107 mg/dL — ABNORMAL HIGH (ref 70–99)
Glucose-Capillary: 161 mg/dL — ABNORMAL HIGH (ref 70–99)

## 2018-11-25 LAB — NM MYOCAR MULTI W/SPECT W/WALL MOTION / EF
Estimated workload: 1 METS
Exercise duration (min): 1 min
Exercise duration (sec): 0 s
LV dias vol: 76 mL (ref 62–150)
LV sys vol: 24 mL
MPHR: 147 {beats}/min
Peak HR: 81 {beats}/min
Percent HR: 55 %
Rest HR: 53 {beats}/min
SDS: 0
SRS: 6
SSS: 3
TID: 0.85

## 2018-11-25 MED ORDER — ISOSORBIDE MONONITRATE ER 30 MG PO TB24
30.0000 mg | ORAL_TABLET | Freq: Every day | ORAL | Status: DC
  Administered 2018-11-25: 30 mg via ORAL
  Filled 2018-11-25: qty 1

## 2018-11-25 MED ORDER — TECHNETIUM TC 99M TETROFOSMIN IV KIT
10.0000 | PACK | Freq: Once | INTRAVENOUS | Status: AC | PRN
  Administered 2018-11-25: 11.12 via INTRAVENOUS

## 2018-11-25 MED ORDER — METOPROLOL TARTRATE 25 MG PO TABS: 25.0000 mg | ORAL_TABLET | Freq: Two times a day (BID) | ORAL | 2 refills | Status: DC

## 2018-11-25 MED ORDER — METOPROLOL TARTRATE 25 MG PO TABS: 25.0000 mg | ORAL_TABLET | Freq: Two times a day (BID) | ORAL | Status: DC

## 2018-11-25 MED ORDER — NITROGLYCERIN 0.4 MG SL SUBL: 0.4000 mg | SUBLINGUAL_TABLET | SUBLINGUAL | 1 refills | Status: DC | PRN

## 2018-11-25 MED ORDER — REGADENOSON 0.4 MG/5ML IV SOLN
0.4000 mg | Freq: Once | INTRAVENOUS | Status: AC
  Administered 2018-11-25: 10:00:00 0.4 mg via INTRAVENOUS
  Filled 2018-11-25: qty 5

## 2018-11-25 MED ORDER — TECHNETIUM TC 99M TETROFOSMIN IV KIT
30.0000 | PACK | Freq: Once | INTRAVENOUS | Status: AC | PRN
  Administered 2018-11-25: 31.55 via INTRAVENOUS

## 2018-11-25 MED ORDER — RAMIPRIL 10 MG PO CAPS: 10.0000 mg | ORAL_CAPSULE | Freq: Every day | ORAL | 2 refills | Status: DC

## 2018-11-25 MED ORDER — ISOSORBIDE MONONITRATE ER 30 MG PO TB24: 30.0000 mg | ORAL_TABLET | Freq: Every day | ORAL | 2 refills | Status: DC

## 2018-11-25 NOTE — Discharge Summary (Signed)
[facility] [place] NAME: Luke Rojas    MR#:  [id number]  DATE OF BIRTH:  [birth year]  DATE OF ADMISSION:  11/24/2018   ADMITTING PHYSICIAN: Demetrios Loll, MD  DATE OF DISCHARGE:  11/25/18  PRIMARY CARE PHYSICIAN: Chrismon, Vickki Muff, PA   ADMISSION DIAGNOSIS:   Essential (primary) hypertension [I10] Coronary artery disease involving native coronary artery of native heart without angina pectoris [I25.10] Hyperlipidemia, unspecified hyperlipidemia type [E78.5] Chest pain, unspecified type [R07.9]  DISCHARGE DIAGNOSIS:   Active Problems:   Chest pain   SECONDARY DIAGNOSIS:   Past Medical History:  Diagnosis Date  . Chronic kidney disease    kidney stones  . Coronary artery disease    2 stents   . Diabetes mellitus without complication ([place])   . GERD (gastroesophageal reflux disease)   . Hypercholesteremia   . Hypertension   . Myocardial infarction [facility]) 2013    HOSPITAL COURSE:   73 year old male with past medical history significant for CAD status post stents in 2013, hypertension, hyperlipidemia, CKD and GERD presents to hospital secondary to significant chest pain.  1.  Chest pain-sounds like stable angina.  Relieved with oral nitroglycerin. -Cardiac markers are negative.  Monitored on telemetry.  No current symptoms. -Stress test done showing low risk for occlusive CAD. -Added Imdur for his medications.  Discharge and outpatient cardiology follow-up.  2.  Hypertension-patient on Norvasc, Toprol.  Discontinued his hydrochlorothiazide.  Added Imdur. -Metoprolol dose has been reduced at the time of discharge since patient's heart rate was in the 50s without metoprolol while in the hospital.  PCP to follow-up within 1 to 2 weeks  3.  GERD-Prilosec  4.  CKD-creatinine stable at 1.4.  Patient has been up and ambulatory.  Will be discharged home today  DISCHARGE CONDITIONS:   Guarded  CONSULTS OBTAINED:    None  DRUG ALLERGIES:   Allergies  Allergen Reactions  . Codeine Nausea Only   DISCHARGE MEDICATIONS:   Allergies as of 11/25/2018      Reactions   Codeine Nausea Only      Medication List    STOP taking these medications   hydrochlorothiazide 12.5 MG capsule Commonly known as: MICROZIDE     TAKE these medications   amLODipine 5 MG tablet Commonly known as: NORVASC Take 5 mg by mouth daily.   aspirin EC 81 MG tablet Take 81 mg by mouth daily.   co-enzyme Q-10 30 MG capsule Take 30 mg by mouth daily.   diclofenac sodium 1 % Gel Commonly known as: VOLTAREN Apply 2 g topically 4 (four) times daily.   Fish Oil 1200 MG Caps Take 1 capsule by mouth daily.   isosorbide mononitrate 30 MG 24 hr tablet Commonly known as: IMDUR Take 1 tablet (30 mg total) by mouth daily.   Lipitor 40 MG tablet Generic drug: atorvastatin Take 1 tablet by mouth at bedtime.   meloxicam 15 MG tablet Commonly known as: MOBIC TAKE 1 TABLET BY MOUTH ONCE DAILY   metoprolol tartrate 25 MG tablet Commonly known as: LOPRESSOR Take 1 tablet (25 mg total) by mouth 2 (two) times daily. What changed:   medication strength  how much to take  how to take this  when to take this  additional instructions   MULTIPLE VITAMIN PO Take 1 tablet by mouth daily.   nitroGLYCERIN 0.4 MG SL tablet Commonly known as: Nitrostat Place 1 tablet (0.4 mg total) under the tongue every 5 (five)  minutes as needed for chest pain. What changed:   reasons to take this  additional instructions   omeprazole 20 MG capsule Commonly known as: PRILOSEC Take 20 mg by mouth daily.   ramipril 10 MG capsule Commonly known as: ALTACE Take 1 capsule (10 mg total) by mouth daily. What changed: how much to take        DISCHARGE INSTRUCTIONS:   1. Cardiology f/u in 1-2 weeks 2. PCP f/u in 2 weeks  DIET:   Cardiac diet  ACTIVITY:   Activity as tolerated  OXYGEN:   Home Oxygen: No.  Oxygen  Delivery: room air  DISCHARGE LOCATION:   home   If you experience worsening of your admission symptoms, develop shortness of breath, life threatening emergency, suicidal or homicidal thoughts you must seek medical attention immediately by calling 911 or calling your MD immediately  if symptoms less severe.  You Must read complete instructions/literature along with all the possible adverse reactions/side effects for all the Medicines you take and that have been prescribed to you. Take any new Medicines after you have completely understood and accpet all the possible adverse reactions/side effects.   Please note  You were cared for by a hospitalist during your hospital stay. If you have any questions about your discharge medications or the care you received while you were in the hospital after you are discharged, you can call the unit and asked to speak with the hospitalist on call if the hospitalist that took care of you is not available. Once you are discharged, your primary care physician will handle any further medical issues. Please note that NO REFILLS for any discharge medications will be authorized once you are discharged, as it is imperative that you return to your primary care physician (or establish a relationship with a primary care physician if you do not have one) for your aftercare needs so that they can reassess your need for medications and monitor your lab values.    On the day of Discharge:  VITAL SIGNS:   Blood pressure 139/66, pulse (!) 57, temperature 97.8 F (36.6 C), temperature source Oral, resp. rate 18, height 5\' 9"  (1.753 m), weight 86.3 kg, SpO2 100 %.  PHYSICAL EXAMINATION:    GENERAL:  73 y.o.-year-old patient lying in the bed with no acute distress.  EYES: Pupils equal, round, reactive to light and accommodation. No scleral icterus. Extraocular muscles intact.  HEENT: Head atraumatic, normocephalic. Oropharynx and nasopharynx clear.  NECK:  Supple, no  jugular venous distention. No thyroid enlargement, no tenderness.  LUNGS: Normal breath sounds bilaterally, no wheezing, rales,rhonchi or crepitation. No use of accessory muscles of respiration.  CARDIOVASCULAR: S1, S2 normal. No  rubs, or gallops. 2/6 systolic murmur present ABDOMEN: Soft, non-tender, non-distended. Bowel sounds present. No organomegaly or mass.  EXTREMITIES: No pedal edema, cyanosis, or clubbing.  NEUROLOGIC: Cranial nerves II through XII are intact. Muscle strength 5/5 in all extremities. Sensation intact. Gait not checked.  PSYCHIATRIC: The patient is alert and oriented x 3.  SKIN: No obvious rash, lesion, or ulcer.   DATA REVIEW:   CBC Recent Labs  Lab 11/24/18 1522  WBC 8.5  HGB 14.8  HCT 43.2  PLT 179    Chemistries  Recent Labs  Lab 11/24/18 1522 11/24/18 1844  NA 139  --   K 4.0  --   CL 107  --   CO2 23  --   GLUCOSE 102*  --   BUN 25*  --  CREATININE 1.41* 1.48*  CALCIUM 9.3  --      Microbiology Results  Results for orders placed or performed during the hospital encounter of 09/16/16  Urine Culture     Status: Abnormal   Collection Time: 09/16/16  8:07 AM   Specimen: Urine, Random  Result Value Ref Range Status   Specimen Description URINE, RANDOM  Final   Special Requests NONE  Final   Culture >=100,000 COLONIES/mL ESCHERICHIA COLI (A)  Final   Report Status 09/19/2016 FINAL  Final   Organism ID, Bacteria ESCHERICHIA COLI (A)  Final      Susceptibility   Escherichia coli - MIC*    AMPICILLIN >=32 RESISTANT Resistant     CEFAZOLIN >=64 RESISTANT Resistant     CEFTRIAXONE <=1 SENSITIVE Sensitive     CIPROFLOXACIN >=4 RESISTANT Resistant     GENTAMICIN <=1 SENSITIVE Sensitive     IMIPENEM <=0.25 SENSITIVE Sensitive     NITROFURANTOIN <=16 SENSITIVE Sensitive     TRIMETH/SULFA <=20 SENSITIVE Sensitive     AMPICILLIN/SULBACTAM >=32 RESISTANT Resistant     PIP/TAZO 64 INTERMEDIATE Intermediate     Extended ESBL NEGATIVE Sensitive      * >=100,000 COLONIES/mL ESCHERICHIA COLI    RADIOLOGY:  Dg Chest 2 View  Result Date: 11/24/2018 CLINICAL DATA:  Central chest pain for 2 hours. History of myocardial infarction, hypertension and diabetes. EXAM: CHEST - 2 VIEW COMPARISON:  Radiographs 12/10/2010.  Abdominal CT 04/18/2016. FINDINGS: The heart size and mediastinal contours are stable. There are coronary artery calcifications versus stents. There is chronic obstructive pulmonary disease with scattered pleuroparenchymal nodularity, stable. No confluent airspace opacity, pleural effusion or pneumothorax. There is an old fracture of the distal left clavicle. No acute osseous findings are evident. IMPRESSION: Stable chronic lung disease with emphysema. No acute cardiopulmonary process. Electronically Signed   By: Richardean Sale M.D.   On: 11/24/2018 16:10   Nm Myocar Multi W/spect W/wall Motion / Ef  Result Date: 11/25/2018  Blood pressure demonstrated a normal response to exercise.  There was no ST segment deviation noted during stress.  The study is normal.  This is a low risk study.  The left ventricular ejection fraction is moderately decreased (30-44%).      Management plans discussed with the patient, family and they are in agreement.  CODE STATUS:     Code Status Orders  (From admission, onward)         Start     Ordered   11/24/18 1835  Full code  Continuous     11/24/18 1834        Code Status History    This patient has a current code status but no historical code status.   Advance Care Planning Activity      TOTAL TIME TAKING CARE OF THIS PATIENT: 38 minutes.    Gladstone Lighter M.D on 11/25/2018 at 2:02 PM  Between 7am to 6pm - Pager - [phone]  After 6pm go to [email or web address] - Proofreader  [facility]  Office  [phone]  CC: Primary care physician; Margo Common, PA   Note: This dictation was prepared with Dragon dictation along with  smaller phrase technology. Any transcriptional errors that result from this process are unintentional.

## 2018-11-25 NOTE — Progress Notes (Signed)
Metoprolol held due to HR 54. MD willis made aware.

## 2018-11-26 NOTE — Progress Notes (Signed)
Luke Rojas  MRN: [id number] DOB: September 23, 1945  Subjective:  HPI   The patient is a 73 year old male who presents today for follow up after being seen in the ER.  He was scheduled to have his physical today but does not want to do that at this time.   The patient was seen in the ER for chest pain on 11/24/18.  He states that they made changes to his medications and feels he needs to review this to get it sorted out.    Patient states he had a temperature of 101 last night.  He states that he is having a throbbing headache today.  He was in bed all day yesterday "recuperating".  Patient Active Problem List   Diagnosis Date Noted  . Chest pain 11/24/2018  . Coronary artery disease involving native coronary artery without angina pectoris 08/05/2017  . Lumbar radiculopathy (R-sided) L4 07/04/2017  . 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 Lumbar facet arthropathy 07/04/2017  . 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 Foraminal stenosis of thoracic region 07/04/2017  . Chronic pain syndrome 07/04/2017  . Encounter for screening colonoscopy 05/04/2015  . Arthritis 10/08/2014  . H/O acute myocardial infarction 10/08/2014  . Acid reflux 10/08/2014  . HLD (hyperlipidemia) 10/08/2014  . BP (high blood pressure) 10/08/2014  . Hyperlipidemia 12/05/2007  . Benign neoplasm of skin 07/19/2006  . Essential (primary) hypertension 11/15/2005  . Decreased potassium in the blood 11/15/2005    Past Medical History:  Diagnosis Date  . Chronic kidney disease    kidney stones  . Coronary artery disease    2 stents   . Diabetes mellitus without complication ([place])   . GERD (gastroesophageal reflux disease)   . Hypercholesteremia   . Hypertension   . Myocardial infarction [facility]) 2013   Past Surgical History:  Procedure Laterality Date  . CARDIAC CATHETERIZATION  2013   2 stents  . COLONOSCOPY  04/27/05  . COLONOSCOPY WITH PROPOFOL N/A 06/08/2015   Procedure:  COLONOSCOPY WITH PROPOFOL;  Surgeon: Robert Bellow, MD;  Location: [facility] ENDOSCOPY;  Service: Endoscopy;  Laterality: N/A;  . CYSTOSCOPY WITH STENT PLACEMENT Right 09/16/2016   Procedure: CYSTOSCOPY WITH STENT PLACEMENT;  Surgeon: Royston Cowper, MD;  Location: [facility] ORS;  Service: Urology;  Laterality: Right;  . EXTRACORPOREAL SHOCK WAVE LITHOTRIPSY Left 11/04/2014   has had 2 previous lithotripsies  . EXTRACORPOREAL SHOCK WAVE LITHOTRIPSY Left 03/24/2015   Procedure: EXTRACORPOREAL SHOCK WAVE LITHOTRIPSY (ESWL);  Surgeon: Royston Cowper, MD;  Location: [facility] ORS;  Service: Urology;  Laterality: Left;  . EXTRACORPOREAL SHOCK WAVE LITHOTRIPSY Right 10/04/2016   Procedure: EXTRACORPOREAL SHOCK WAVE LITHOTRIPSY (ESWL);  Surgeon: Royston Cowper, MD;  Location: [facility] ORS;  Service: Urology;  Laterality: Right;   Family History  Problem Relation Age of Onset  . Pulmonary embolism Mother   . Transient ischemic attack Mother   . Diabetes Mother   . Pancreatic cancer Father   . Hypertension Father   . Diabetes Father   . Cirrhosis Brother    Social History   Socioeconomic History  . Marital status: Married    Spouse name: Not on file  . Number of children: 2  . Years of education: Not on file  . Highest education level: 8th grade  Occupational History  . Occupation: retired  Scientific laboratory technician  . Financial resource strain: Not hard at all  . Food insecurity    Worry: Never true  Inability: Never true  . Transportation needs    Medical: No    Non-medical: No  Tobacco Use  . Smoking status: Former Smoker    Packs/day: 1.00    Years: 30.00    Pack years: 30.00    Types: Cigarettes    Quit date: 03/05/1984    Years since quitting: 34.7  . Smokeless tobacco: Current User    Types: Chew  Substance and Sexual Activity  . Alcohol use: No    Alcohol/week: 0.0 standard drinks  . Drug use: No  . Sexual activity: Not on file  Lifestyle  . Physical activity    Days per week: Not on file     Minutes per session: Not on file  . Stress: Not at all  Relationships  . Social Herbalist on phone: Not on file    Gets together: Not on file    Attends religious service: Not on file    Active member of club or organization: Not on file    Attends meetings of clubs or organizations: Not on file    Relationship status: Not on file  . Intimate partner violence    Fear of current or ex partner: Not on file    Emotionally abused: Not on file    Physically abused: Not on file    Forced sexual activity: Not on file  Other Topics Concern  . Not on file  Social History Narrative  . Not on file   Outpatient Encounter Medications as of 11/27/2018  Medication Sig  . amLODipine (NORVASC) 5 MG tablet Take 5 mg by mouth daily.   Marland Kitchen aspirin EC 81 MG tablet Take 81 mg by mouth daily.  Marland Kitchen atorvastatin (LIPITOR) 40 MG tablet Take 1 tablet by mouth at bedtime.   Marland Kitchen co-enzyme Q-10 30 MG capsule Take 30 mg by mouth daily.   . diclofenac sodium (VOLTAREN) 1 % GEL Apply 2 g topically 4 (four) times daily.  . isosorbide mononitrate (IMDUR) 30 MG 24 hr tablet Take 1 tablet (30 mg total) by mouth daily.  Javier Docker Oil 1000 MG CAPS Take 1 mg by mouth daily.  . meloxicam (MOBIC) 15 MG tablet TAKE 1 TABLET BY MOUTH ONCE DAILY  . metoprolol tartrate (LOPRESSOR) 25 MG tablet Take 1 tablet (25 mg total) by mouth 2 (two) times daily.  . MULTIPLE VITAMIN PO Take 1 tablet by mouth daily.  . nitroGLYCERIN (NITROSTAT) 0.4 MG SL tablet Place 1 tablet (0.4 mg total) under the tongue every 5 (five) minutes as needed for chest pain.  Marland Kitchen omeprazole (PRILOSEC) 20 MG capsule Take 20 mg by mouth daily.   . ramipril (ALTACE) 10 MG capsule Take 1 capsule (10 mg total) by mouth daily.  . Omega-3 Fatty Acids (FISH OIL) 1200 MG CAPS Take 1 capsule by mouth daily.    No facility-administered encounter medications on file as of 11/27/2018.    Allergies  Allergen Reactions  . Codeine Nausea Only   Review of Systems   Constitutional: Positive for fever. Negative for chills, diaphoresis and malaise/fatigue.  HENT: Negative for congestion, ear pain, sinus pain and sore throat.   Respiratory: Positive for shortness of breath (not short but has to breath in deep). Negative for cough.   Cardiovascular: Positive for palpitations (may have last night 1 time). Negative for chest pain and leg swelling.  Gastrointestinal: Negative for abdominal pain and diarrhea.    Objective:  BP (!) 142/68 (BP Location: Right Arm, Patient Position:  Sitting, Cuff Size: Normal)   Pulse 77   Temp (!) 97.1 F (36.2 C) (Skin)   Wt 191 lb (86.6 kg)   SpO2 99%   BMI 28.21 kg/m   Physical Exam  Constitutional: He is oriented to person, place, and time and well-developed, well-nourished, and in no distress.  HENT:  Head: Normocephalic.  Eyes: Conjunctivae are normal.  Neck: Neck supple.  Cardiovascular: Normal rate and regular rhythm.  Pulmonary/Chest: Effort normal and breath sounds normal.  Abdominal: Soft. Bowel sounds are normal.  Musculoskeletal: Normal range of motion.  Neurological: He is alert and oriented to person, place, and time.  Skin: No rash noted.  Psychiatric: Mood, affect and judgment normal.   Diabetic Foot Form - Detailed   Diabetic Foot Exam - detailed Diabetic Foot exam was performed with the following findings: Yes 11/27/2018 11:54 AM  Visual Foot Exam completed.: Yes  Can the patient see the bottom of their feet?: Yes Are the shoes appropriate in style and fit?: Yes Is there swelling or and abnormal foot shape?: No Is there a claw toe deformity?: No Is there elevated skin temparature?: No Is there foot or ankle muscle weakness?: No Normal Range of Motion: Yes Pulse Foot Exam completed.: Yes  Right posterior Tibialias: Present Left posterior Tibialias: Present  Right Dorsalis Pedis: Present Left Dorsalis Pedis: Present  Sensory Foot Exam Completed.: Yes Semmes-Weinstein Monofilament Test R Site  1-Great Toe: Pos L Site 1-Great Toe: Pos    Comments: Well trimmed thick right great toenail. Asymptomatic.     Assessment and Plan :  1. Coronary artery disease involving native coronary artery of native heart without angina pectoris Admitted to [facility] on 11-24-18 due to chest pains. Stress test showing low risk for occlusive CAD and added Imdur 30 mg qd for angina. Scheduled for cardiology follow up in 1-2 weeks. Recheck routine labs and follow up prn. - CBC with Differential/Platelet - Comprehensive metabolic panel - Lipid panel - TSH  2. Mixed hyperlipidemia On 08-27-18, total cholesterol was 97, triglycerides 183, HDL 27 and LDL 33. Continues to take Atorvastatin 40 mg qd with Co-Q 10 and Omega-3 daily. Continue to follow low fat diet and recheck labs. - Comprehensive metabolic panel - Lipid panel - TSH  3. Essential (primary) hypertension BP fairly well controlled with influence of recent headache and fever symptoms. Continued on Amlodipine, Ramipril  and Toprol after hospitalization  11-24-18 to 11-25-18. HCTZ was discontinued and Imdur added. No respiratory symptoms or fever today. Recheck CBC, CMP, Lipid panel and TSH. - CBC with Differential/Platelet - Comprehensive metabolic panel - Lipid panel - TSH  4. Prediabetes Last Hgb A1C was 6.2% on 08-27-18. No hypoglycemic episodes documented. Trying to exercise some and working on a low fat diet. Recheck CBC, CMP and Hgb A1C. - CBC with Differential/Platelet - Comprehensive metabolic panel - Hemoglobin A1c

## 2018-11-27 ENCOUNTER — Ambulatory Visit (INDEPENDENT_AMBULATORY_CARE_PROVIDER_SITE_OTHER): Payer: Medicare Other | Admitting: Family Medicine

## 2018-11-27 ENCOUNTER — Ambulatory Visit: Payer: Medicare Other

## 2018-11-27 ENCOUNTER — Other Ambulatory Visit: Payer: Self-pay

## 2018-11-27 VITALS — BP 142/68 | HR 77 | Temp 97.1°F | Wt 191.0 lb

## 2018-11-27 DIAGNOSIS — I2510 Atherosclerotic heart disease of native coronary artery without angina pectoris: Secondary | ICD-10-CM

## 2018-11-27 DIAGNOSIS — I10 Essential (primary) hypertension: Secondary | ICD-10-CM

## 2018-11-27 DIAGNOSIS — E782 Mixed hyperlipidemia: Secondary | ICD-10-CM

## 2018-11-27 DIAGNOSIS — R7303 Prediabetes: Secondary | ICD-10-CM | POA: Diagnosis not present

## 2018-12-01 ENCOUNTER — Telehealth: Payer: Self-pay | Admitting: *Deleted

## 2018-12-01 NOTE — Telephone Encounter (Signed)
Appointment cancelled, patient notified. He states that he will have labs drawn tomorrow morning.

## 2018-12-01 NOTE — Telephone Encounter (Signed)
Do you still want him to come in tomorrow?

## 2018-12-01 NOTE — Telephone Encounter (Signed)
No need for another appointment this week unless he needs [place]. Just be sure to get lab tests done.

## 2018-12-01 NOTE — Telephone Encounter (Signed)
Patient wants to know if he needs to keep HFU appt for 12/02/2018, since he was seen on 11/27/2018? Please advise? Can leave message on vm if you can't reach him.

## 2018-12-02 ENCOUNTER — Inpatient Hospital Stay: Payer: Medicare Other | Admitting: Family Medicine

## 2018-12-03 LAB — HEMOGLOBIN A1C
Est. average glucose Bld gHb Est-mCnc: 131 mg/dL
Hgb A1c MFr Bld: 6.2 % — ABNORMAL HIGH (ref 4.8–5.6)

## 2018-12-03 LAB — CBC WITH DIFFERENTIAL/PLATELET
Basophils Absolute: 0 10*3/uL (ref 0.0–0.2)
Basos: 1 %
EOS (ABSOLUTE): 0.3 10*3/uL (ref 0.0–0.4)
Eos: 6 %
Hematocrit: 41.8 % (ref 37.5–51.0)
Hemoglobin: 13.6 g/dL (ref 13.0–17.7)
Immature Grans (Abs): 0 10*3/uL (ref 0.0–0.1)
Immature Granulocytes: 0 %
Lymphocytes Absolute: 1.9 10*3/uL (ref 0.7–3.1)
Lymphs: 32 %
MCH: 29.7 pg (ref 26.6–33.0)
MCHC: 32.5 g/dL (ref 31.5–35.7)
MCV: 91 fL (ref 79–97)
Monocytes Absolute: 0.7 10*3/uL (ref 0.1–0.9)
Monocytes: 11 %
Neutrophils Absolute: 3 10*3/uL (ref 1.4–7.0)
Neutrophils: 50 %
Platelets: 181 10*3/uL (ref 150–450)
RBC: 4.58 x10E6/uL (ref 4.14–5.80)
RDW: 14.6 % (ref 11.6–15.4)
WBC: 6 10*3/uL (ref 3.4–10.8)

## 2018-12-03 LAB — COMPREHENSIVE METABOLIC PANEL
ALT: 35 IU/L (ref 0–44)
AST: 24 IU/L (ref 0–40)
Albumin/Globulin Ratio: 1.5 (ref 1.2–2.2)
Albumin: 4.1 g/dL (ref 3.7–4.7)
Alkaline Phosphatase: 73 IU/L (ref 39–117)
BUN/Creatinine Ratio: 13 (ref 10–24)
BUN: 18 mg/dL (ref 8–27)
Bilirubin Total: 0.5 mg/dL (ref 0.0–1.2)
CO2: 20 mmol/L (ref 20–29)
Calcium: 8.9 mg/dL (ref 8.6–10.2)
Chloride: 104 mmol/L (ref 96–106)
Creatinine, Ser: 1.38 mg/dL — ABNORMAL HIGH (ref 0.76–1.27)
GFR calc Af Amer: 58 mL/min/{1.73_m2} — ABNORMAL LOW (ref >59)
GFR calc non Af Amer: 50 mL/min/{1.73_m2} — ABNORMAL LOW (ref >59)
Globulin, Total: 2.8 g/dL (ref 1.5–4.5)
Glucose: 124 mg/dL — ABNORMAL HIGH (ref 65–99)
Potassium: 4.7 mmol/L (ref 3.5–5.2)
Sodium: 139 mmol/L (ref 134–144)
Total Protein: 6.9 g/dL (ref 6.0–8.5)

## 2018-12-03 LAB — LIPID PANEL
Chol/HDL Ratio: 3.4 ratio (ref 0.0–5.0)
Cholesterol, Total: 82 mg/dL — ABNORMAL LOW (ref 100–199)
HDL: 24 mg/dL — ABNORMAL LOW (ref >39)
LDL Chol Calc (NIH): 34 mg/dL (ref 0–99)
Triglycerides: 136 mg/dL (ref 0–149)
VLDL Cholesterol Cal: 24 mg/dL (ref 5–40)

## 2018-12-03 LAB — TSH: TSH: 5.82 u[IU]/mL — ABNORMAL HIGH (ref 0.450–4.500)

## 2018-12-04 ENCOUNTER — Other Ambulatory Visit: Payer: Self-pay

## 2018-12-04 ENCOUNTER — Inpatient Hospital Stay
Admission: EM | Admit: 2018-12-04 | Discharge: 2018-12-05 | DRG: 287 | Disposition: A | Discharge status: Home / Self Care | Payer: Medicare Other | Attending: Internal Medicine | Admitting: Internal Medicine

## 2018-12-04 ENCOUNTER — Emergency Department: Payer: Medicare Other

## 2018-12-04 ENCOUNTER — Encounter: Payer: Self-pay | Admitting: Emergency Medicine

## 2018-12-04 DIAGNOSIS — Z885 Allergy status to narcotic agent status: Secondary | ICD-10-CM | POA: Diagnosis not present

## 2018-12-04 DIAGNOSIS — E119 Type 2 diabetes mellitus without complications: Secondary | ICD-10-CM

## 2018-12-04 DIAGNOSIS — G894 Chronic pain syndrome: Secondary | ICD-10-CM | POA: Diagnosis present

## 2018-12-04 DIAGNOSIS — Z8249 Family history of ischemic heart disease and other diseases of the circulatory system: Secondary | ICD-10-CM | POA: Diagnosis not present

## 2018-12-04 DIAGNOSIS — I25110 Atherosclerotic heart disease of native coronary artery with unstable angina pectoris: Principal | ICD-10-CM | POA: Diagnosis present

## 2018-12-04 DIAGNOSIS — E1122 Type 2 diabetes mellitus with diabetic chronic kidney disease: Secondary | ICD-10-CM | POA: Diagnosis present

## 2018-12-04 DIAGNOSIS — I129 Hypertensive chronic kidney disease with stage 1 through stage 4 chronic kidney disease, or unspecified chronic kidney disease: Secondary | ICD-10-CM | POA: Diagnosis present

## 2018-12-04 DIAGNOSIS — Z79899 Other long term (current) drug therapy: Secondary | ICD-10-CM

## 2018-12-04 DIAGNOSIS — Z20828 Contact with and (suspected) exposure to other viral communicable diseases: Secondary | ICD-10-CM | POA: Diagnosis present

## 2018-12-04 DIAGNOSIS — N182 Chronic kidney disease, stage 2 (mild): Secondary | ICD-10-CM | POA: Diagnosis present

## 2018-12-04 DIAGNOSIS — Z720 Tobacco use: Secondary | ICD-10-CM | POA: Diagnosis not present

## 2018-12-04 DIAGNOSIS — I10 Essential (primary) hypertension: Secondary | ICD-10-CM | POA: Diagnosis present

## 2018-12-04 DIAGNOSIS — Z7982 Long term (current) use of aspirin: Secondary | ICD-10-CM

## 2018-12-04 DIAGNOSIS — E782 Mixed hyperlipidemia: Secondary | ICD-10-CM | POA: Diagnosis present

## 2018-12-04 DIAGNOSIS — R079 Chest pain, unspecified: Secondary | ICD-10-CM | POA: Diagnosis present

## 2018-12-04 DIAGNOSIS — Z87442 Personal history of urinary calculi: Secondary | ICD-10-CM | POA: Diagnosis not present

## 2018-12-04 DIAGNOSIS — Z955 Presence of coronary angioplasty implant and graft: Secondary | ICD-10-CM

## 2018-12-04 DIAGNOSIS — I252 Old myocardial infarction: Secondary | ICD-10-CM | POA: Diagnosis not present

## 2018-12-04 DIAGNOSIS — M1990 Unspecified osteoarthritis, unspecified site: Secondary | ICD-10-CM | POA: Diagnosis present

## 2018-12-04 DIAGNOSIS — I2510 Atherosclerotic heart disease of native coronary artery without angina pectoris: Secondary | ICD-10-CM | POA: Diagnosis present

## 2018-12-04 DIAGNOSIS — K219 Gastro-esophageal reflux disease without esophagitis: Secondary | ICD-10-CM | POA: Diagnosis present

## 2018-12-04 DIAGNOSIS — I472 Ventricular tachycardia: Secondary | ICD-10-CM | POA: Diagnosis present

## 2018-12-04 DIAGNOSIS — Z833 Family history of diabetes mellitus: Secondary | ICD-10-CM

## 2018-12-04 DIAGNOSIS — E785 Hyperlipidemia, unspecified: Secondary | ICD-10-CM | POA: Diagnosis present

## 2018-12-04 DIAGNOSIS — Z791 Long term (current) use of non-steroidal anti-inflammatories (NSAID): Secondary | ICD-10-CM | POA: Diagnosis not present

## 2018-12-04 DIAGNOSIS — I25119 Atherosclerotic heart disease of native coronary artery with unspecified angina pectoris: Secondary | ICD-10-CM

## 2018-12-04 LAB — BASIC METABOLIC PANEL
Anion gap: 9 (ref 5–15)
BUN: 21 mg/dL (ref 8–23)
CO2: 22 mmol/L (ref 22–32)
Calcium: 8.9 mg/dL (ref 8.9–10.3)
Chloride: 106 mmol/L (ref 98–111)
Creatinine, Ser: 1.41 mg/dL — ABNORMAL HIGH (ref 0.61–1.24)
GFR calc Af Amer: 57 mL/min — ABNORMAL LOW (ref >60)
GFR calc non Af Amer: 49 mL/min — ABNORMAL LOW (ref >60)
Glucose, Bld: 126 mg/dL — ABNORMAL HIGH (ref 70–99)
Potassium: 3.9 mmol/L (ref 3.5–5.1)
Sodium: 137 mmol/L (ref 135–145)

## 2018-12-04 LAB — CBC
HCT: 39 % (ref 39.0–52.0)
Hemoglobin: 13.2 g/dL (ref 13.0–17.0)
MCH: 30.1 pg (ref 26.0–34.0)
MCHC: 33.8 g/dL (ref 30.0–36.0)
MCV: 88.8 fL (ref 80.0–100.0)
Platelets: 186 10*3/uL (ref 150–400)
RBC: 4.39 MIL/uL (ref 4.22–5.81)
RDW: 13.8 % (ref 11.5–15.5)
WBC: 7.8 10*3/uL (ref 4.0–10.5)
nRBC: 0 % (ref 0.0–0.2)

## 2018-12-04 LAB — SARS CORONAVIRUS 2 BY RT PCR (HOSPITAL ORDER, PERFORMED IN ~~LOC~~ HOSPITAL LAB): SARS Coronavirus 2: NEGATIVE

## 2018-12-04 LAB — TROPONIN I (HIGH SENSITIVITY)
Troponin I (High Sensitivity): 4 ng/L (ref <18)
Troponin I (High Sensitivity): 6 ng/L (ref <18)

## 2018-12-04 LAB — MAGNESIUM: Magnesium: 1.9 mg/dL (ref 1.7–2.4)

## 2018-12-04 MED ORDER — METOPROLOL TARTRATE 25 MG PO TABS
25.0000 mg | ORAL_TABLET | Freq: Once | ORAL | Status: AC
  Administered 2018-12-04: 22:00:00 25 mg via ORAL
  Filled 2018-12-04: qty 1

## 2018-12-04 MED ORDER — ASPIRIN 81 MG PO CHEW
243.0000 mg | CHEWABLE_TABLET | Freq: Once | ORAL | Status: AC
  Administered 2018-12-04: 22:00:00 243 mg via ORAL
  Filled 2018-12-04: qty 3

## 2018-12-04 NOTE — ED Triage Notes (Signed)
Pt in via POV, reports centralized chest pain, relief with 2 Nitro prior to arrival.  Denies any associated symptoms.  NAD noted at this time.

## 2018-12-04 NOTE — ED Provider Notes (Signed)
[facility] Emergency Department Provider Note   ____________________________________________   First MD Initiated Contact with Patient 12/04/18 2056     (approximate)  I have reviewed the triage vital signs and the nursing notes.   HISTORY  Chief Complaint Chest Pain    HPI Luke Rojas is a 73 y.o. male with past medical history of CAD, hypertension, diabetes, CKD presents to the ED complaining of chest pain.  Patient reports he initially had some sharp pain in the center of his chest yesterday while he was sitting still.  This resolved with 1 dose of sublingual nitro.  He had another episode of similar sharp pain across his anterior chest while walking around at a store earlier today.  He went home and took some nitro, symptoms abated after the second dose of nitro.  He was admitted last week for similar chest pain, had negative work-up at that time including low risk stress test.  He spoke with his cardiologist today, Dr. Clayborn Bigness, who recommended he be evaluated in the ED.  He states he has had no further chest pain and denies any fevers, cough, or shortness of breath.        Past Medical History:  Diagnosis Date  . Chronic kidney disease    kidney stones  . Coronary artery disease    2 stents   . Diabetes mellitus without complication ([place])   . GERD (gastroesophageal reflux disease)   . Hypercholesteremia   . Hypertension   . Myocardial infarction [facility]) 2013    Patient Active Problem List   Diagnosis Date Noted  . Chest pain 11/24/2018  . Coronary artery disease involving native coronary artery without angina pectoris 08/05/2017  . Lumbar radiculopathy (R-sided) L4 07/04/2017  . 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 Lumbar facet arthropathy 07/04/2017  . 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 Foraminal stenosis of thoracic region 07/04/2017  . Chronic pain syndrome 07/04/2017  . Encounter for  screening colonoscopy 05/04/2015  . Arthritis 10/08/2014  . H/O acute myocardial infarction 10/08/2014  . Acid reflux 10/08/2014  . HLD (hyperlipidemia) 10/08/2014  . BP (high blood pressure) 10/08/2014  . Hyperlipidemia 12/05/2007  . Benign neoplasm of skin 07/19/2006  . Essential (primary) hypertension 11/15/2005  . Decreased potassium in the blood 11/15/2005    Past Surgical History:  Procedure Laterality Date  . CARDIAC CATHETERIZATION  2013   2 stents  . COLONOSCOPY  04/27/05  . COLONOSCOPY WITH PROPOFOL N/A 06/08/2015   Procedure: COLONOSCOPY WITH PROPOFOL;  Surgeon: Robert Bellow, MD;  Location: [facility] ENDOSCOPY;  Service: Endoscopy;  Laterality: N/A;  . CYSTOSCOPY WITH STENT PLACEMENT Right 09/16/2016   Procedure: CYSTOSCOPY WITH STENT PLACEMENT;  Surgeon: Royston Cowper, MD;  Location: [facility] ORS;  Service: Urology;  Laterality: Right;  . EXTRACORPOREAL SHOCK WAVE LITHOTRIPSY Left 11/04/2014   has had 2 previous lithotripsies  . EXTRACORPOREAL SHOCK WAVE LITHOTRIPSY Left 03/24/2015   Procedure: EXTRACORPOREAL SHOCK WAVE LITHOTRIPSY (ESWL);  Surgeon: Royston Cowper, MD;  Location: [facility] ORS;  Service: Urology;  Laterality: Left;  . EXTRACORPOREAL SHOCK WAVE LITHOTRIPSY Right 10/04/2016   Procedure: EXTRACORPOREAL SHOCK WAVE LITHOTRIPSY (ESWL);  Surgeon: Royston Cowper, MD;  Location: [facility] ORS;  Service: Urology;  Laterality: Right;    Prior to Admission medications   Medication Sig Start Date End Date Taking? Authorizing Provider  amLODipine (NORVASC) 5 MG tablet Take 5 mg by mouth daily.  03/10/18 03/10/19  [provider]  aspirin EC 81 MG tablet Take 81 mg by mouth daily.    [provider]  atorvastatin (LIPITOR) 40 MG tablet Take 1 tablet by mouth at bedtime.  09/29/11   [provider]  co-enzyme Q-10 30 MG capsule Take 30 mg by mouth daily.     [provider]  diclofenac sodium (VOLTAREN) 1 % GEL Apply 2 g topically 4 (four) times daily.  07/08/17   Chrismon, Vickki Muff, PA  isosorbide mononitrate (IMDUR) 30 MG 24 hr tablet Take 1 tablet (30 mg total) by mouth daily. 11/25/18   Gladstone Lighter, MD  Javier Docker Oil 1000 MG CAPS Take 1 mg by mouth daily.    [provider]  meloxicam (MOBIC) 15 MG tablet TAKE 1 TABLET BY MOUTH ONCE DAILY 10/27/18   Chrismon, Vickki Muff, PA  metoprolol tartrate (LOPRESSOR) 25 MG tablet Take 1 tablet (25 mg total) by mouth 2 (two) times daily. 11/25/18   Gladstone Lighter, MD  MULTIPLE VITAMIN PO Take 1 tablet by mouth daily.    [provider]  nitroGLYCERIN (NITROSTAT) 0.4 MG SL tablet Place 1 tablet (0.4 mg total) under the tongue every 5 (five) minutes as needed for chest pain. 11/25/18   Gladstone Lighter, MD  Omega-3 Fatty Acids (FISH OIL) 1200 MG CAPS Take 1 capsule by mouth daily.     [provider]  omeprazole (PRILOSEC) 20 MG capsule Take 20 mg by mouth daily.  01/26/15   [provider]  ramipril (ALTACE) 10 MG capsule Take 1 capsule (10 mg total) by mouth daily. 11/25/18   Gladstone Lighter, MD    Allergies Codeine  Family History  Problem Relation Age of Onset  . Pulmonary embolism Mother   . Transient ischemic attack Mother   . Diabetes Mother   . Pancreatic cancer Father   . Hypertension Father   . Diabetes Father   . Cirrhosis Brother     Social History Social History   Tobacco Use  . Smoking status: Former Smoker    Packs/day: 1.00    Years: 30.00    Pack years: 30.00    Types: Cigarettes    Quit date: 03/05/1984    Years since quitting: 34.7  . Smokeless tobacco: Current User    Types: Chew  Substance Use Topics  . Alcohol use: No    Alcohol/week: 0.0 standard drinks  . Drug use: No    Review of Systems  Constitutional: No fever/chills Eyes: No visual changes. ENT: No sore throat. Cardiovascular: Positive for chest pain. Respiratory: Denies shortness of breath. Gastrointestinal: No abdominal pain.  No nausea, no vomiting.  No  diarrhea.  No constipation. Genitourinary: Negative for dysuria. Musculoskeletal: Negative for back pain. Skin: Negative for rash. Neurological: Negative for headaches, focal weakness or numbness.  ____________________________________________   PHYSICAL EXAM:  VITAL SIGNS: ED Triage Vitals  Enc Vitals Group     BP 12/04/18 1818 (!) 157/72     Pulse Rate 12/04/18 1818 66     Resp 12/04/18 1818 18     Temp 12/04/18 1818 98.2 F (36.8 C)     Temp Source 12/04/18 1818 Oral     SpO2 12/04/18 1818 98 %     Weight 12/04/18 1719 190 lb (86.2 kg)     Height 12/04/18 1719 5\' 6"  (1.676 m)     Head Circumference --      Peak Flow --      Pain Score 12/04/18 1719 0     Pain Loc --  Pain Edu? --      Excl. in [place]? --     Constitutional: Alert and oriented. Eyes: Conjunctivae are normal. Head: Atraumatic. Nose: No congestion/rhinnorhea. Mouth/Throat: Mucous membranes are moist. Neck: Normal ROM Cardiovascular: Normal rate, regular rhythm. Grossly normal heart sounds. Respiratory: Normal respiratory effort.  No retractions. Lungs CTAB. Gastrointestinal: Soft and nontender. No distention. Genitourinary: deferred Musculoskeletal: No lower extremity tenderness nor edema. Neurologic:  Normal speech and language. No gross focal neurologic deficits are appreciated. Skin:  Skin is warm, dry and intact. No rash noted. Psychiatric: Mood and affect are normal. Speech and behavior are normal.  ____________________________________________   LABS (all labs ordered are listed, but only abnormal results are displayed)  Labs Reviewed  BASIC METABOLIC PANEL - Abnormal; Notable for the following components:      Result Value   Glucose, Bld 126 (*)    Creatinine, Ser 1.41 (*)    GFR calc non Af Amer 49 (*)    GFR calc Af Amer 57 (*)    All other components within normal limits  SARS CORONAVIRUS 2 (HOSPITAL ORDER, [facility])  CBC  MAGNESIUM  TROPONIN I (HIGH  SENSITIVITY)  TROPONIN I (HIGH SENSITIVITY)   ____________________________________________  EKG  ED ECG REPORT I, Blake Divine, the attending physician, personally viewed and interpreted this ECG.   Date: 12/04/2018  EKG Time: 17:14  Rate: 64  Rhythm: normal sinus rhythm  Axis: Normal  Intervals:none  ST&T Change: None    PROCEDURES  Procedure(s) performed (including Critical Care):  Procedures   ____________________________________________   INITIAL IMPRESSION / ASSESSMENT AND PLAN / ED COURSE       73 year old male with history of CAD presents to the ED with episodes of sharp central chest pain both yesterday and today, alleviated by nitro.  His EKG shows no ischemic changes and initial troponin negative.  Chest x-ray negative for acute process.  Remainder of labs unremarkable.  Case was discussed with Dr. Clayborn Bigness of cardiology, who recommends patient be admitted for catheterization in the morning given patient with more frequent angina.  Case discussed with hospitalist, who accepts patient for admission.  Patient noted to have very brief run of V. Tach, approximately 6 beats.  He was asymptomatic for this and denies any recurrent chest pain or shortness of breath.  This was discussed with Dr. Clayborn Bigness, who recommends additional dose of metoprolol, will continue to monitor.      ____________________________________________   FINAL CLINICAL IMPRESSION(S) / ED DIAGNOSES  Final diagnoses:  Chest pain, unspecified type  Coronary artery disease involving native coronary artery of native heart with angina pectoris [facility])  Mixed hyperlipidemia     ED Discharge Orders    None       Note:  This document was prepared using Dragon voice recognition software and may include unintentional dictation errors.   Blake Divine, MD 12/04/18 2218

## 2018-12-04 NOTE — H&P (Signed)
[facility] NAME: Luke Rojas    MR#:  [id number]  DATE OF BIRTH:  1945-05-22  DATE OF ADMISSION:  12/04/2018  PRIMARY CARE PHYSICIAN: Chrismon, Vickki Muff, PA   REQUESTING/REFERRING PHYSICIAN: Charna Archer, MD  CHIEF COMPLAINT:   Chief Complaint  Patient presents with  . Chest Pain    HISTORY OF PRESENT ILLNESS:  Luke Rojas  is a 73 y.o. male who presents with chief complaint as above.  Patient presents the ED with a complaint of several episodes of chest pain requiring nitroglycerin.  He has known CAD with prior stents, but has never required nitro before per his report.  He had chest pain severe enough to require nitro today, which helped it resolve.  He then had another episode that progressed similarly.  He came to ED for evaluation.  ED physician spoke with Dr. Clayborn Bigness his cardiologist who recommended he be admitted tonight for further work-up tomorrow by cardiology.  PAST MEDICAL HISTORY:   Past Medical History:  Diagnosis Date  . Chronic kidney disease    kidney stones  . Coronary artery disease    2 stents   . Diabetes mellitus without complication ([place])   . GERD (gastroesophageal reflux disease)   . Hypercholesteremia   . Hypertension   . Myocardial infarction [facility]) 2013     PAST SURGICAL HISTORY:   Past Surgical History:  Procedure Laterality Date  . CARDIAC CATHETERIZATION  2013   2 stents  . COLONOSCOPY  04/27/05  . COLONOSCOPY WITH PROPOFOL N/A 06/08/2015   Procedure: COLONOSCOPY WITH PROPOFOL;  Surgeon: Robert Bellow, MD;  Location: [facility] ENDOSCOPY;  Service: Endoscopy;  Laterality: N/A;  . CYSTOSCOPY WITH STENT PLACEMENT Right 09/16/2016   Procedure: CYSTOSCOPY WITH STENT PLACEMENT;  Surgeon: Royston Cowper, MD;  Location: [facility] ORS;  Service: Urology;  Laterality: Right;  . EXTRACORPOREAL SHOCK WAVE LITHOTRIPSY Left 11/04/2014   has had 2 previous lithotripsies  . EXTRACORPOREAL SHOCK  WAVE LITHOTRIPSY Left 03/24/2015   Procedure: EXTRACORPOREAL SHOCK WAVE LITHOTRIPSY (ESWL);  Surgeon: Royston Cowper, MD;  Location: [facility] ORS;  Service: Urology;  Laterality: Left;  . EXTRACORPOREAL SHOCK WAVE LITHOTRIPSY Right 10/04/2016   Procedure: EXTRACORPOREAL SHOCK WAVE LITHOTRIPSY (ESWL);  Surgeon: Royston Cowper, MD;  Location: [facility] ORS;  Service: Urology;  Laterality: Right;     SOCIAL HISTORY:   Social History   Tobacco Use  . Smoking status: Former Smoker    Packs/day: 1.00    Years: 30.00    Pack years: 30.00    Types: Cigarettes    Quit date: 03/05/1984    Years since quitting: 34.7  . Smokeless tobacco: Current User    Types: Chew  Substance Use Topics  . Alcohol use: No    Alcohol/week: 0.0 standard drinks     FAMILY HISTORY:   Family History  Problem Relation Age of Onset  . Pulmonary embolism Mother   . Transient ischemic attack Mother   . Diabetes Mother   . Pancreatic cancer Father   . Hypertension Father   . Diabetes Father   . Cirrhosis Brother      DRUG ALLERGIES:   Allergies  Allergen Reactions  . Codeine Nausea Only    MEDICATIONS AT HOME:   Prior to Admission medications   Medication Sig Start Date End Date Taking? Authorizing Provider  amLODipine (NORVASC) 5 MG tablet Take 5 mg by mouth daily.  03/10/18 03/10/19  [provider]  aspirin  EC 81 MG tablet Take 81 mg by mouth daily.    [provider]  atorvastatin (LIPITOR) 40 MG tablet Take 1 tablet by mouth at bedtime.  09/29/11   [provider]  co-enzyme Q-10 30 MG capsule Take 30 mg by mouth daily.     [provider]  diclofenac sodium (VOLTAREN) 1 % GEL Apply 2 g topically 4 (four) times daily. 07/08/17   Chrismon, Vickki Muff, PA  isosorbide mononitrate (IMDUR) 30 MG 24 hr tablet Take 1 tablet (30 mg total) by mouth daily. 11/25/18   Gladstone Lighter, MD  Javier Docker Oil 1000 MG CAPS Take 1 mg by mouth daily.    [provider]  meloxicam (MOBIC)  15 MG tablet TAKE 1 TABLET BY MOUTH ONCE DAILY 10/27/18   Chrismon, Vickki Muff, PA  metoprolol tartrate (LOPRESSOR) 25 MG tablet Take 1 tablet (25 mg total) by mouth 2 (two) times daily. 11/25/18   Gladstone Lighter, MD  MULTIPLE VITAMIN PO Take 1 tablet by mouth daily.    [provider]  nitroGLYCERIN (NITROSTAT) 0.4 MG SL tablet Place 1 tablet (0.4 mg total) under the tongue every 5 (five) minutes as needed for chest pain. 11/25/18   Gladstone Lighter, MD  Omega-3 Fatty Acids (FISH OIL) 1200 MG CAPS Take 1 capsule by mouth daily.     [provider]  omeprazole (PRILOSEC) 20 MG capsule Take 20 mg by mouth daily.  01/26/15   [provider]  ramipril (ALTACE) 10 MG capsule Take 1 capsule (10 mg total) by mouth daily. 11/25/18   Gladstone Lighter, MD    REVIEW OF SYSTEMS:  Review of Systems  Constitutional: Negative for chills, fever, malaise/fatigue and weight loss.  HENT: Negative for ear pain, hearing loss and tinnitus.   Eyes: Negative for blurred vision, double vision, pain and redness.  Respiratory: Negative for cough, hemoptysis and shortness of breath.   Cardiovascular: Positive for chest pain. Negative for palpitations, orthopnea and leg swelling.  Gastrointestinal: Negative for abdominal pain, constipation, diarrhea, nausea and vomiting.  Genitourinary: Negative for dysuria, frequency and hematuria.  Musculoskeletal: Negative for back pain, joint pain and neck pain.  Skin:       No acne, rash, or lesions  Neurological: Negative for dizziness, tremors, focal weakness and weakness.  Endo/Heme/Allergies: Negative for polydipsia. Does not bruise/bleed easily.  Psychiatric/Behavioral: Negative for depression. The patient is not nervous/anxious and does not have insomnia.      VITAL SIGNS:   Vitals:   12/04/18 1719 12/04/18 1818 12/04/18 2200  BP:  (!) 157/72 (!) 142/55  Pulse:  66 65  Resp:  18 19  Temp:  98.2 F (36.8 C)   TempSrc:  Oral   SpO2:  98%  100%  Weight: 86.2 kg    Height: 5\' 6"  (1.676 m)     Wt Readings from Last 3 Encounters:  12/04/18 86.2 kg  11/27/18 86.6 kg  11/24/18 86.3 kg    PHYSICAL EXAMINATION:  Physical Exam  Vitals reviewed. Constitutional: He is oriented to person, place, and time. He appears well-developed and well-nourished. No distress.  HENT:  Head: Normocephalic and atraumatic.  Mouth/Throat: Oropharynx is clear and moist.  Eyes: Pupils are equal, round, and reactive to light. Conjunctivae and EOM are normal. No scleral icterus.  Neck: Normal range of motion. Neck supple. No JVD present. No thyromegaly present.  Cardiovascular: Normal rate, regular rhythm and intact distal pulses. Exam reveals no gallop and no friction rub.  No murmur heard.  Respiratory: Effort normal and breath sounds normal. No respiratory distress. He has no wheezes. He has no rales.  GI: Soft. Bowel sounds are normal. He exhibits no distension. There is no abdominal tenderness.  Musculoskeletal: Normal range of motion.        General: No edema.     Comments: No arthritis, no gout  Lymphadenopathy:    He has no cervical adenopathy.  Neurological: He is alert and oriented to person, place, and time. No cranial nerve deficit.  No dysarthria, no aphasia  Skin: Skin is warm and dry. No rash noted. No erythema.  Psychiatric: He has a normal mood and affect. His behavior is normal. Judgment and thought content normal.    LABORATORY PANEL:   CBC Recent Labs  Lab 12/04/18 1728  WBC 7.8  HGB 13.2  HCT 39.0  PLT 186   ------------------------------------------------------------------------------------------------------------------  Chemistries  Recent Labs  Lab 12/02/18 0817 12/04/18 1728 12/04/18 2220  NA 139 137  --   K 4.7 3.9  --   CL 104 106  --   CO2 20 22  --   GLUCOSE 124* 126*  --   BUN 18 21  --   CREATININE 1.38* 1.41*  --   CALCIUM 8.9 8.9  --   MG  --   --  1.9  AST 24  --   --   ALT 35  --   --    ALKPHOS 73  --   --   BILITOT 0.5  --   --    ------------------------------------------------------------------------------------------------------------------  Cardiac Enzymes No results for input(s): TROPONINI in the last 168 hours. ------------------------------------------------------------------------------------------------------------------  RADIOLOGY:  Dg Chest 2 View  Result Date: 12/04/2018 CLINICAL DATA:  Patient reports centralized chest pain, relief with 2 Nitro prior to arrival. Denies any associated symptoms. States he came to the hospital last week for the same reason. Hx of MI, HTN, diabetes, CAD. Former smoker. EXAM: CHEST - 2 VIEW COMPARISON:  11/24/2018 FINDINGS: Cardiac silhouette is normal in size. No mediastinal or hilar masses. No evidence of adenopathy. Mild stable areas of reticulonodular type lung scarring. No evidence of pneumonia or pulmonary edema. No pleural effusion or pneumothorax. Skeletal structures are intact. IMPRESSION: No active cardiopulmonary disease. Electronically Signed   By: Lajean Manes M.D.   On: 12/04/2018 17:56    EKG:   Orders placed or performed during the hospital encounter of 12/04/18  . EKG 12-Lead  . EKG 12-Lead  . ED EKG  . ED EKG    IMPRESSION AND PLAN:  Principal Problem:   Chest pain -patient's chest pain has resolved at this time.  Initial work-up in the ED is largely within normal limits.  Will admit to telemetry floor with cardiac monitoring.  We will get a cardiology consult Active Problems:   Essential (primary) hypertension -continue home dose antihypertensives   Coronary artery disease involving native coronary artery without angina pectoris -continue home meds, other work-up as above   Diabetes ([place]) -sliding scale insulin coverage   Acid reflux -home dose PPI   HLD (hyperlipidemia) -home dose antilipid  Chart review performed and case discussed with ED provider. Labs, imaging and/or ECG reviewed by provider and  discussed with patient/family. Management plans discussed with the patient and/or family.  COVID-19 status: Pending  DVT PROPHYLAXIS: SubQ lovenox   GI PROPHYLAXIS:  PPI   ADMISSION STATUS: Inpatient     CODE STATUS: Full Code Status History    Date Active Date Inactive Code Status Order  ID Comments User Context   11/24/2018 1834 11/25/2018 1800 Full Code [id number]  Demetrios Loll, MD Inpatient   Advance Care Planning Activity      TOTAL TIME TAKING CARE OF THIS PATIENT: 45 minutes.   This patient was evaluated in the context of the global COVID-19 pandemic, which necessitated consideration that the patient might be at risk for infection with the SARS-CoV-2 virus that causes COVID-19. Institutional protocols and algorithms that pertain to the evaluation of patients at risk for COVID-19 are in a state of rapid change based on information released by regulatory bodies including the CDC and federal and state organizations. These policies and algorithms were followed to the best of this provider's knowledge to date during the patient's care at this facility.  Ethlyn Daniels 12/04/2018, 11:20 PM  [facility]  Office  [phone]  CC: Primary care physician; Margo Common, PA  Note:  This document was prepared using Dragon voice recognition software and may include unintentional dictation errors.

## 2018-12-05 ENCOUNTER — Encounter
Admission: EM | Disposition: A | Discharge status: Home / Self Care | Payer: Self-pay | Source: Home / Self Care | Attending: Internal Medicine

## 2018-12-05 ENCOUNTER — Encounter: Payer: Self-pay | Admitting: Internal Medicine

## 2018-12-05 HISTORY — PX: LEFT HEART CATH AND CORONARY ANGIOGRAPHY: CATH118249

## 2018-12-05 LAB — BASIC METABOLIC PANEL
Anion gap: 8 (ref 5–15)
BUN: 20 mg/dL (ref 8–23)
CO2: 21 mmol/L — ABNORMAL LOW (ref 22–32)
Calcium: 8.9 mg/dL (ref 8.9–10.3)
Chloride: 107 mmol/L (ref 98–111)
Creatinine, Ser: 1.37 mg/dL — ABNORMAL HIGH (ref 0.61–1.24)
GFR calc Af Amer: 59 mL/min — ABNORMAL LOW (ref >60)
GFR calc non Af Amer: 51 mL/min — ABNORMAL LOW (ref >60)
Glucose, Bld: 129 mg/dL — ABNORMAL HIGH (ref 70–99)
Potassium: 4.4 mmol/L (ref 3.5–5.1)
Sodium: 136 mmol/L (ref 135–145)

## 2018-12-05 LAB — CBC
HCT: 42.6 % (ref 39.0–52.0)
Hemoglobin: 14.3 g/dL (ref 13.0–17.0)
MCH: 29.5 pg (ref 26.0–34.0)
MCHC: 33.6 g/dL (ref 30.0–36.0)
MCV: 87.8 fL (ref 80.0–100.0)
Platelets: 189 10*3/uL (ref 150–400)
RBC: 4.85 MIL/uL (ref 4.22–5.81)
RDW: 13.5 % (ref 11.5–15.5)
WBC: 8 10*3/uL (ref 4.0–10.5)
nRBC: 0 % (ref 0.0–0.2)

## 2018-12-05 LAB — GLUCOSE, CAPILLARY
Glucose-Capillary: 119 mg/dL — ABNORMAL HIGH (ref 70–99)
Glucose-Capillary: 123 mg/dL — ABNORMAL HIGH (ref 70–99)
Glucose-Capillary: 81 mg/dL (ref 70–99)

## 2018-12-05 SURGERY — LEFT HEART CATH AND CORONARY ANGIOGRAPHY
Anesthesia: Moderate Sedation

## 2018-12-05 MED ORDER — ACETAMINOPHEN 325 MG PO TABS: 650.0000 mg | ORAL_TABLET | ORAL | Status: DC | PRN

## 2018-12-05 MED ORDER — ACETAMINOPHEN 650 MG RE SUPP: 650.0000 mg | Freq: Four times a day (QID) | RECTAL | Status: DC | PRN

## 2018-12-05 MED ORDER — HYDRALAZINE HCL 20 MG/ML IJ SOLN: 10.0000 mg | INTRAMUSCULAR | Status: AC | PRN

## 2018-12-05 MED ORDER — ISOSORBIDE MONONITRATE ER 30 MG PO TB24: 30.0000 mg | ORAL_TABLET | Freq: Once | ORAL | Status: DC

## 2018-12-05 MED ORDER — SODIUM CHLORIDE 0.9% FLUSH: 3.0000 mL | Freq: Two times a day (BID) | INTRAVENOUS | Status: DC

## 2018-12-05 MED ORDER — ISOSORBIDE MONONITRATE ER 30 MG PO TB24
30.0000 mg | ORAL_TABLET | Freq: Every day | ORAL | Status: DC
  Administered 2018-12-05: 30 mg via ORAL
  Filled 2018-12-05: qty 1

## 2018-12-05 MED ORDER — ONDANSETRON HCL 4 MG/2ML IJ SOLN: 4.0000 mg | Freq: Four times a day (QID) | INTRAMUSCULAR | Status: DC | PRN

## 2018-12-05 MED ORDER — SODIUM CHLORIDE 0.9 % IV SOLN
INTRAVENOUS | Status: AC | PRN
  Administered 2018-12-05: 250 mL via INTRAVENOUS

## 2018-12-05 MED ORDER — AMLODIPINE BESYLATE 5 MG PO TABS
5.0000 mg | ORAL_TABLET | Freq: Every day | ORAL | Status: DC
  Administered 2018-12-05: 5 mg via ORAL
  Filled 2018-12-05: qty 1

## 2018-12-05 MED ORDER — SODIUM CHLORIDE 0.9 % IV SOLN: 250.0000 mL | INTRAVENOUS | Status: DC | PRN

## 2018-12-05 MED ORDER — SODIUM CHLORIDE 0.9 % WEIGHT BASED INFUSION: 1.0000 mL/kg/h | INTRAVENOUS | Status: DC

## 2018-12-05 MED ORDER — ASPIRIN 81 MG PO CHEW
CHEWABLE_TABLET | ORAL | Status: AC
  Filled 2018-12-05: qty 1

## 2018-12-05 MED ORDER — ASPIRIN EC 81 MG PO TBEC
81.0000 mg | DELAYED_RELEASE_TABLET | Freq: Every day | ORAL | Status: DC
  Filled 2018-12-05: qty 1

## 2018-12-05 MED ORDER — VERAPAMIL HCL 2.5 MG/ML IV SOLN
INTRAVENOUS | Status: AC
  Filled 2018-12-05: qty 2

## 2018-12-05 MED ORDER — SODIUM CHLORIDE 0.9% FLUSH: 3.0000 mL | INTRAVENOUS | Status: DC | PRN

## 2018-12-05 MED ORDER — HEPARIN (PORCINE) IN NACL 1000-0.9 UT/500ML-% IV SOLN
INTRAVENOUS | Status: DC | PRN
  Administered 2018-12-05: 500 mL

## 2018-12-05 MED ORDER — FENTANYL CITRATE (PF) 100 MCG/2ML IJ SOLN
INTRAMUSCULAR | Status: AC
  Administered 2018-12-05: 12:00:00 12.5 ug via INTRAVENOUS
  Filled 2018-12-05: qty 2

## 2018-12-05 MED ORDER — ATORVASTATIN CALCIUM 20 MG PO TABS: 40.0000 mg | ORAL_TABLET | Freq: Every day | ORAL | Status: DC

## 2018-12-05 MED ORDER — LABETALOL HCL 5 MG/ML IV SOLN: 10.0000 mg | INTRAVENOUS | Status: AC | PRN

## 2018-12-05 MED ORDER — HEPARIN (PORCINE) IN NACL 1000-0.9 UT/500ML-% IV SOLN
INTRAVENOUS | Status: AC
  Filled 2018-12-05: qty 1000

## 2018-12-05 MED ORDER — ACETAMINOPHEN 325 MG PO TABS: 650.0000 mg | ORAL_TABLET | Freq: Four times a day (QID) | ORAL | Status: DC | PRN

## 2018-12-05 MED ORDER — ASPIRIN 81 MG PO CHEW
81.0000 mg | CHEWABLE_TABLET | ORAL | Status: AC
  Administered 2018-12-05: 08:00:00 81 mg via ORAL

## 2018-12-05 MED ORDER — FENTANYL CITRATE (PF) 100 MCG/2ML IJ SOLN
INTRAMUSCULAR | Status: DC | PRN
  Administered 2018-12-05 (×2): 25 ug via INTRAVENOUS

## 2018-12-05 MED ORDER — METOPROLOL TARTRATE 50 MG PO TABS: 50.0000 mg | ORAL_TABLET | Freq: Two times a day (BID) | ORAL | Status: DC

## 2018-12-05 MED ORDER — RAMIPRIL 10 MG PO CAPS
10.0000 mg | ORAL_CAPSULE | Freq: Every day | ORAL | Status: DC
  Administered 2018-12-05: 10 mg via ORAL
  Filled 2018-12-05: qty 1

## 2018-12-05 MED ORDER — ENOXAPARIN SODIUM 40 MG/0.4ML ~~LOC~~ SOLN: 40.0000 mg | SUBCUTANEOUS | Status: DC

## 2018-12-05 MED ORDER — FENTANYL CITRATE (PF) 100 MCG/2ML IJ SOLN
INTRAMUSCULAR | Status: AC
  Filled 2018-12-05: qty 2

## 2018-12-05 MED ORDER — INSULIN ASPART 100 UNIT/ML ~~LOC~~ SOLN: 0.0000 [IU] | Freq: Three times a day (TID) | SUBCUTANEOUS | Status: DC

## 2018-12-05 MED ORDER — ONDANSETRON HCL 4 MG PO TABS: 4.0000 mg | ORAL_TABLET | Freq: Four times a day (QID) | ORAL | Status: DC | PRN

## 2018-12-05 MED ORDER — METOPROLOL TARTRATE 25 MG PO TABS: 25.0000 mg | ORAL_TABLET | Freq: Two times a day (BID) | ORAL | Status: DC

## 2018-12-05 MED ORDER — OXYCODONE HCL 5 MG PO TABS: 5.0000 mg | ORAL_TABLET | ORAL | Status: DC | PRN

## 2018-12-05 MED ORDER — HEPARIN SODIUM (PORCINE) 1000 UNIT/ML IJ SOLN
INTRAMUSCULAR | Status: AC
  Filled 2018-12-05: qty 1

## 2018-12-05 MED ORDER — MIDAZOLAM HCL 2 MG/2ML IJ SOLN
INTRAMUSCULAR | Status: DC | PRN
  Administered 2018-12-05 (×2): 1 mg via INTRAVENOUS

## 2018-12-05 MED ORDER — CLOPIDOGREL BISULFATE 75 MG PO TABS: 75.0000 mg | ORAL_TABLET | Freq: Every day | ORAL | 11 refills | Status: AC

## 2018-12-05 MED ORDER — PANTOPRAZOLE SODIUM 20 MG PO TBEC
20.0000 mg | DELAYED_RELEASE_TABLET | Freq: Every day | ORAL | Status: DC
  Administered 2018-12-05: 20 mg via ORAL
  Filled 2018-12-05: qty 1

## 2018-12-05 MED ORDER — SODIUM CHLORIDE 0.9 % WEIGHT BASED INFUSION
3.0000 mL/kg/h | INTRAVENOUS | Status: DC
  Administered 2018-12-05: 08:00:00 3 mL/kg/h via INTRAVENOUS

## 2018-12-05 MED ORDER — IOHEXOL 300 MG/ML  SOLN
INTRAMUSCULAR | Status: DC | PRN
  Administered 2018-12-05: 185 mL

## 2018-12-05 MED ORDER — VERAPAMIL HCL 2.5 MG/ML IV SOLN
INTRAVENOUS | Status: DC | PRN
  Administered 2018-12-05: 2.5 mg via INTRA_ARTERIAL

## 2018-12-05 MED ORDER — HEPARIN SODIUM (PORCINE) 1000 UNIT/ML IJ SOLN
INTRAMUSCULAR | Status: DC | PRN
  Administered 2018-12-05: 2000 [IU] via INTRAVENOUS
  Administered 2018-12-05: 4000 [IU] via INTRAVENOUS
  Administered 2018-12-05: 4300 [IU] via INTRAVENOUS

## 2018-12-05 MED ORDER — FENTANYL CITRATE (PF) 100 MCG/2ML IJ SOLN
12.5000 ug | Freq: Once | INTRAMUSCULAR | Status: AC
  Administered 2018-12-05: 12:00:00 12.5 ug via INTRAVENOUS

## 2018-12-05 MED ORDER — MIDAZOLAM HCL 2 MG/2ML IJ SOLN
INTRAMUSCULAR | Status: AC
  Filled 2018-12-05: qty 2

## 2018-12-05 MED ORDER — INSULIN ASPART 100 UNIT/ML ~~LOC~~ SOLN: 0.0000 [IU] | Freq: Every day | SUBCUTANEOUS | Status: DC

## 2018-12-05 SURGICAL SUPPLY — 15 items
CATH INFINITI 5 FR JL3.5 (CATHETERS) ×2 IMPLANT
CATH INFINITI 5FR JL4 (CATHETERS) ×2 IMPLANT
CATH INFINITI JR4 5F (CATHETERS) ×2 IMPLANT
CATH VISTA GUIDE 6FR XB3.5 (CATHETERS) ×2 IMPLANT
DEVICE CLOSURE MYNXGRIP 6/7F (Vascular Products) ×2 IMPLANT
DEVICE INFLAT 30 PLUS (MISCELLANEOUS) ×2 IMPLANT
DEVICE RAD TR BAND REGULAR (VASCULAR PRODUCTS) ×2 IMPLANT
GLIDESHEATH SLEND SS 6F .021 (SHEATH) ×2 IMPLANT
KIT MANI 3VAL PERCEP (MISCELLANEOUS) ×3 IMPLANT
PACK CARDIAC CATH (CUSTOM PROCEDURE TRAY) ×3 IMPLANT
SHEATH AVANTI 6FR X 11CM (SHEATH) ×2 IMPLANT
WIRE GUIDERIGHT .035X150 (WIRE) ×2 IMPLANT
WIRE HITORQ VERSACORE ST 145CM (WIRE) IMPLANT
WIRE PRESSURE VERRATA (WIRE) ×2 IMPLANT
WIRE ROSEN-J .035X260CM (WIRE) ×2 IMPLANT

## 2018-12-05 NOTE — Plan of Care (Signed)
Discharge instructions provided to pt.  All questions addressed.  Understanding verified through teach back.  Awaiting transportation home via POV.   Problem: Education: Goal: Knowledge of General Education information will improve Description: Including pain rating scale, medication(s)/side effects and non-pharmacologic comfort measures Outcome: Completed/Met   Problem: Health Behavior/Discharge Planning: Goal: Ability to manage health-related needs will improve Outcome: Completed/Met   Problem: Clinical Measurements: Goal: Ability to maintain clinical measurements within normal limits will improve Outcome: Completed/Met Goal: Will remain free from infection Outcome: Completed/Met Goal: Diagnostic test results will improve Outcome: Completed/Met Goal: Respiratory complications will improve Outcome: Completed/Met Goal: Cardiovascular complication will be avoided Outcome: Completed/Met   Problem: Pain Managment: Goal: General experience of comfort will improve Outcome: Completed/Met   Problem: Education: Goal: Understanding of cardiac disease, CV risk reduction, and recovery process will improve Outcome: Completed/Met Goal: Understanding of medication regimen will improve Outcome: Completed/Met Goal: Individualized Educational Video(s) Outcome: Completed/Met   Problem: Activity: Goal: Ability to tolerate increased activity will improve Outcome: Completed/Met   Problem: Cardiac: Goal: Ability to achieve and maintain adequate cardiopulmonary perfusion will improve Outcome: Completed/Met Goal: Vascular access site(s) Level 0-1 will be maintained Outcome: Completed/Met   Problem: Health Behavior/Discharge Planning: Goal: Ability to safely manage health-related needs after discharge will improve Outcome: Completed/Met

## 2018-12-05 NOTE — Discharge Instructions (Signed)
Coronary Artery Disease, Male Coronary artery disease (CAD) is a condition in which the arteries that lead to the heart (coronary arteries) become narrow or blocked. The narrowing or blockage can lead to decreased blood flow to the heart. Prolonged reduced blood flow can cause a heart attack (myocardial infarction or MI). This condition may also be called coronary heart disease. Because CAD is the leading cause of death in men, it is important to understand what causes this condition and how it is treated. What are the causes? CAD is most often caused by atherosclerosis. This is the buildup of fat and cholesterol (plaque) on the inside of the arteries. Over time, the plaque may narrow or block the artery, reducing blood flow to the heart. Plaque can also become weak and break off within a coronary artery and cause a sudden blockage. Other less common causes of CAD include:  A blood clot or a piece of a blood clot or other substance that blocks the flow of blood in a coronary artery (embolism).  A tearing of the artery (spontaneous coronary artery dissection).  An enlargement of an artery (aneurysm).  Inflammation (vasculitis) in the artery wall. What increases the risk? The following factors may make you more likely to develop this condition:  Age. Men over age 36 are at a greater risk of CAD.  Family history of CAD.  Gender. Men often develop CAD earlier in life than women.  High blood pressure (hypertension).  Diabetes.  High cholesterol levels.  Tobacco use.  Excessive alcohol use.  Lack of exercise.  A diet high in saturated and trans fats, such as fried food and processed meat. Other possible risk factors include:  High stress levels.  Depression.  Obesity.  Sleep apnea. What are the signs or symptoms? Many people do not have any symptoms during the early stages of CAD. As the condition progresses, symptoms may include:  Chest pain (angina). The pain can: ? Feel  like crushing or squeezing, or like a tightness, pressure, fullness, or heaviness in the chest. ? Last more than a few minutes or can stop and recur. The pain tends to get worse with exercise or stress and to fade with rest.  Pain in the arms, neck, jaw, ear, or back.  Unexplained heartburn or indigestion.  Shortness of breath.  Nausea or vomiting.  Sudden light-headedness.  Sudden cold sweats.  Fluttering or fast heartbeat (palpitations). How is this diagnosed? This condition is diagnosed based on:  Your family and medical history.  A physical exam.  Tests, including: ? A test to check the electrical signals in your heart (electrocardiogram). ? Exercise stress test. This looks for signs of blockage when the heart is stressed with exercise, such as running on a treadmill. ? Pharmacologic stress test. This test looks for signs of blockage when the heart is being stressed with a medicine. ? Blood tests. ? Coronary angiogram. This is a procedure to look at the coronary arteries to see if there is any blockage. During this test, a dye is injected into your arteries so they appear on an X-ray. ? Coronary artery CT scan. This CT scan helps detect calcium deposits in your coronary arteries. Calcium deposits are an indicator of CAD. ? A test that uses sound waves to take a picture of your heart (echocardiogram). ? Chest X-ray. How is this treated? This condition may be treated by:  Healthy lifestyle changes to reduce risk factors.  Medicines such as: ? Antiplatelet medicines and blood-thinning medicines, such  as aspirin. These help to prevent blood clots. ? Nitroglycerin. ? Blood pressure medicines. ? Cholesterol-lowering medicine.  Coronary angioplasty and stenting. During this procedure, a thin, flexible tube is inserted through a blood vessel and into a blocked artery. A balloon or similar device on the end of the tube is inflated to open up the artery. In some cases, a small,  mesh tube (stent) is inserted into the artery to keep it open.  Coronary artery bypass surgery. During this surgery, veins or arteries from other parts of the body are used to create a bypass around the blockage and allow blood to reach your heart. Follow these instructions at home: Medicines  Take over-the-counter and prescription medicines only as told by your health care provider.  Do not take the following medicines unless your health care provider approves: ? NSAIDs, such as ibuprofen, naproxen, or celecoxib. ? Vitamin supplements that contain vitamin A, vitamin E, or both. Lifestyle  Follow an exercise program approved by your health care provider. Aim for 150 minutes of moderate exercise or 75 minutes of vigorous exercise each week.  Maintain a healthy weight or lose weight as approved by your health care provider.  Learn to manage stress or try to limit your stress. Ask your health care provider for suggestions if you need help.  Get screened for depression and seek treatment, if needed.  Do not use any products that contain nicotine or tobacco, such as cigarettes, e-cigarettes, and chewing tobacco. If you need help quitting, ask your health care provider.  Do not use illegal drugs. Eating and drinking   Follow a heart-healthy diet. A dietitian can help educate you about healthy food options and changes. In general, eat plenty of fruits and vegetables, lean meats, and whole grains.  Avoid foods high in: ? Sugar. ? Salt (sodium). ? Saturated fat, such as processed or fatty meat. ? Trans fat, such as fried foods.  Use healthy cooking methods such as roasting, grilling, broiling, baking, poaching, steaming, or stir-frying.  Do not drink alcohol if your health care provider tells you not to drink.  If you drink alcohol: ? Limit how much you have to 0-2 drinks per day. ? Be aware of how much alcohol is in your drink. In the [place] one drink equals one 12 oz bottle of beer  (355 mL), one 5 oz glass of wine (148 mL), or one 1 oz glass of hard liquor (44 mL). General instructions  Manage any other health conditions, such as hypertension and diabetes. These conditions affect your heart.  Your health care provider may ask you to monitor your blood pressure. Ideally, your blood pressure should be below 130/80.  Keep all follow-up visits as told by your health care provider. This is important. Get help right away if:  You have pain in your chest, neck, ear, arm, jaw, stomach, or back that: ? Lasts more than a few minutes. ? Is recurring. ? Is not relieved by taking medicine under your tongue (sublingual nitroglycerin).  You have profuse sweating without cause.  You have unexplained: ? Heartburn or indigestion. ? Shortness of breath or difficulty breathing. ? Fluttering or fast heartbeat (palpitations). ? Nausea or vomiting. ? Fatigue. ? Feelings of nervousness or anxiety. ? Weakness. ? Diarrhea.  You have sudden light-headedness or dizziness.  You faint.  You feel like hurting yourself or think about taking your own life. These symptoms may represent a serious problem that is an emergency. Do not wait to see if the  symptoms will go away. Get medical help right away. Call your local emergency services (911 in the [place] Do not drive yourself to the hospital. Summary  Coronary artery disease (CAD) is a condition in which the arteries that lead to the heart (coronary arteries) become narrow or blocked. The narrowing or blockage can lead to a heart attack.  Many people do not have any symptoms during the early stages of CAD.  CAD can be treated with lifestyle changes, medicines, surgery, or a combination of these treatments. This information is not intended to replace advice given to you by your health care provider. Make sure you discuss any questions you have with your health care provider. Document Released: 09/16/2013 Document Revised: 11/08/2017  Document Reviewed: 10/29/2017 Elsevier Patient Education  2020 [employer].

## 2018-12-05 NOTE — Plan of Care (Signed)
  Problem: Education: Goal: Knowledge of General Education information will improve Description: Including pain rating scale, medication(s)/side effects and non-pharmacologic comfort measures Outcome: Progressing Note: Patient profile completed. No complaints of chest pain. Patient is NPO  for cardiac catherization.

## 2018-12-05 NOTE — Progress Notes (Signed)
Pt transported to Special Procedures department for cardiac catheterization.

## 2018-12-05 NOTE — Progress Notes (Signed)
Pt returned to room 233 from cardiac catheterization.  Rt radial and rt femoral accesses assessed and both are at level 0.  NAD noted.

## 2018-12-05 NOTE — Progress Notes (Signed)
Patient able to sit up at 11:40, right wrist dressing changed at 12:00, both sites WNL. Patient with 5/10 CP, MD notified, IV medication administered per order, PO imdur ordered and will be given by bedside RN on return to 2A. Vital signs stable, pt reporting 0/10 pain upon return to inpatient room. 2A RN at bedside.

## 2018-12-05 NOTE — Consult Note (Signed)
Reason for Consult: Unstable angina chest pain known coronary disease Referring Physician: Dr. Vernie Murders primary Cardiologist [place] is an 73 y.o. male.  HPI: Patient is a 73 year old white male known coronary disease history of PCI and stent 2013 DES to proximal RCA mid circumflex.  Patient is done reasonably well without any anginal symptoms since the intervention.  Over the last week or 2 he started having anginal symptoms requiring nitroglycerin which he had done in years.  He describes of midsternal chest discomfort left-sided mild dyspnea sometimes at rest or exertion.  Patient was admitted about a week ago underwent functional study with a [place] which was unremarkable also had echocardiogram both studies showed preserved left ventricular function no clear evidence of ischemia.  Patient was discharged home but continued to have symptoms so return to the emergency room and was subsequently admitted.  Rule out for myocardial infarction now here for further cardiac evaluation possibly cardiac cath  Past Medical History:  Diagnosis Date  . Chronic kidney disease    kidney stones  . Coronary artery disease    2 stents   . Diabetes mellitus without complication ([place])   . GERD (gastroesophageal reflux disease)   . Hypercholesteremia   . Hypertension   . Myocardial infarction [facility]) 2013    Past Surgical History:  Procedure Laterality Date  . CARDIAC CATHETERIZATION  2013   2 stents  . COLONOSCOPY  04/27/05  . COLONOSCOPY WITH PROPOFOL N/A 06/08/2015   Procedure: COLONOSCOPY WITH PROPOFOL;  Surgeon: Robert Bellow, MD;  Location: [facility] ENDOSCOPY;  Service: Endoscopy;  Laterality: N/A;  . CYSTOSCOPY WITH STENT PLACEMENT Right 09/16/2016   Procedure: CYSTOSCOPY WITH STENT PLACEMENT;  Surgeon: Royston Cowper, MD;  Location: [facility] ORS;  Service: Urology;  Laterality: Right;  . EXTRACORPOREAL SHOCK WAVE LITHOTRIPSY Left 11/04/2014   has had 2 previous  lithotripsies  . EXTRACORPOREAL SHOCK WAVE LITHOTRIPSY Left 03/24/2015   Procedure: EXTRACORPOREAL SHOCK WAVE LITHOTRIPSY (ESWL);  Surgeon: Royston Cowper, MD;  Location: [facility] ORS;  Service: Urology;  Laterality: Left;  . EXTRACORPOREAL SHOCK WAVE LITHOTRIPSY Right 10/04/2016   Procedure: EXTRACORPOREAL SHOCK WAVE LITHOTRIPSY (ESWL);  Surgeon: Royston Cowper, MD;  Location: [facility] ORS;  Service: Urology;  Laterality: Right;  . LEFT HEART CATH AND CORONARY ANGIOGRAPHY N/A 12/05/2018   Procedure: LEFT HEART CATH AND CORONARY ANGIOGRAPHY with possible pci and stent;  Surgeon: Yolonda Kida, MD;  Location: [facility] LAB;  Service: Cardiovascular;  Laterality: N/A;    Family History  Problem Relation Age of Onset  . Pulmonary embolism Mother   . Transient ischemic attack Mother   . Diabetes Mother   . Pancreatic cancer Father   . Hypertension Father   . Diabetes Father   . Cirrhosis Brother     Social History:  reports that he quit smoking about 34 years ago. His smoking use included cigarettes. He has a 30.00 pack-year smoking history. His smokeless tobacco use includes chew. He reports that he does not drink alcohol or use drugs.  Allergies:  Allergies  Allergen Reactions  . Codeine Nausea Only    Medications: I have reviewed the patient's current medications.  Results for orders placed or performed during the hospital encounter of 12/04/18 (from the past 48 hour(s))  Basic metabolic panel     Status: Abnormal   Collection Time: 12/04/18  5:28 PM  Result Value Ref Range   Sodium 137 135 - 145 mmol/L   Potassium 3.9  3.5 - 5.1 mmol/L   Chloride 106 98 - 111 mmol/L   CO2 22 22 - 32 mmol/L   Glucose, Bld 126 (H) 70 - 99 mg/dL   BUN 21 8 - 23 mg/dL   Creatinine, Ser 1.41 (H) 0.61 - 1.24 mg/dL   Calcium 8.9 8.9 - 10.3 mg/dL   GFR calc non Af Amer 49 (L) >60 mL/min   GFR calc Af Amer 57 (L) >60 mL/min   Anion gap 9 5 - 15    Comment: Performed at [facility],  [address] [place], [place] [address]  CBC     Status: None   Collection Time: 12/04/18  5:28 PM  Result Value Ref Range   WBC 7.8 4.0 - 10.5 K/uL   RBC 4.39 4.22 - 5.81 MIL/uL   Hemoglobin 13.2 13.0 - 17.0 g/dL   HCT 39.0 39.0 - 52.0 %   MCV 88.8 80.0 - 100.0 fL   MCH 30.1 26.0 - 34.0 pg   MCHC 33.8 30.0 - 36.0 g/dL   RDW 13.8 11.5 - 15.5 %   Platelets 186 150 - 400 K/uL   nRBC 0.0 0.0 - 0.2 %    Comment: Performed at [facility], [place], [place] [address]  Troponin I (High Sensitivity)     Status: None   Collection Time: 12/04/18  5:28 PM  Result Value Ref Range   Troponin I (High Sensitivity) 4 <18 ng/L    Comment: (NOTE) Elevated high sensitivity troponin I (hsTnI) values and significant  changes across serial measurements may suggest ACS but many other  chronic and acute conditions are known to elevate hsTnI results.  Refer to the "Links" section for chest pain algorithms and additional  guidance. Performed at [facility], [place], [place] [address]   Troponin I (High Sensitivity)     Status: None   Collection Time: 12/04/18  9:07 PM  Result Value Ref Range   Troponin I (High Sensitivity) 6 <18 ng/L    Comment: (NOTE) Elevated high sensitivity troponin I (hsTnI) values and significant  changes across serial measurements may suggest ACS but many other  chronic and acute conditions are known to elevate hsTnI results.  Refer to the "Links" section for chest pain algorithms and additional  guidance. Performed at [facility], [address] [place], [place] [address]   SARS Coronavirus 2 [facility] order, Performed in [facility] hospital lab) Nasopharyngeal Nasopharyngeal Swab     Status: None   Collection Time: 12/04/18 10:16 PM   Specimen: Nasopharyngeal Swab  Result Value Ref Range   SARS Coronavirus 2 NEGATIVE NEGATIVE    Comment: (NOTE) If result is NEGATIVE SARS-CoV-2 target nucleic acids  are NOT DETECTED. The SARS-CoV-2 RNA is generally detectable in upper and lower  respiratory specimens during the acute phase of infection. The lowest  concentration of SARS-CoV-2 viral copies this assay can detect is 250  copies / mL. A negative result does not preclude SARS-CoV-2 infection  and should not be used as the sole basis for treatment or other  patient management decisions.  A negative result may occur with  improper specimen collection / handling, submission of specimen other  than nasopharyngeal swab, presence of viral mutation(s) within the  areas targeted by this assay, and inadequate number of viral copies  (<250 copies / mL). A negative result must be combined with clinical  observations, patient history, and epidemiological information. If result is POSITIVE SARS-CoV-2  target nucleic acids are DETECTED. The SARS-CoV-2 RNA is generally detectable in upper and lower  respiratory specimens dur ing the acute phase of infection.  Positive  results are indicative of active infection with SARS-CoV-2.  Clinical  correlation with patient history and other diagnostic information is  necessary to determine patient infection status.  Positive results do  not rule out bacterial infection or co-infection with other viruses. If result is PRESUMPTIVE POSTIVE SARS-CoV-2 nucleic acids MAY BE PRESENT.   A presumptive positive result was obtained on the submitted specimen  and confirmed on repeat testing.  While 2019 novel coronavirus  (SARS-CoV-2) nucleic acids may be present in the submitted sample  additional confirmatory testing may be necessary for epidemiological  and / or clinical management purposes  to differentiate between  SARS-CoV-2 and other Sarbecovirus currently known to infect humans.  If clinically indicated additional testing with an alternate test  methodology [phone]) is advised. The SARS-CoV-2 RNA is generally  detectable in upper and lower respiratory sp ecimens  during the acute  phase of infection. The expected result is Negative. Fact Sheet for Patients:  StrictlyIdeas.no Fact Sheet for Healthcare Providers: BankingDealers.co.za This test is not yet approved or cleared by the [place] FDA and has been authorized for detection and/or diagnosis of SARS-CoV-2 by FDA under an Emergency Use Authorization (EUA).  This EUA will remain in effect (meaning this test can be used) for the duration of the COVID-19 declaration under Section 564(b)(1) of the Act, 21 U.S.C. section 360bbb-3(b)(1), unless the authorization is terminated or revoked sooner. Performed at [facility], [address] [place], [place] [address]   Magnesium     Status: None   Collection Time: 12/04/18 10:20 PM  Result Value Ref Range   Magnesium 1.9 1.7 - 2.4 mg/dL    Comment: Performed at [facility], [address] [place], [place] [address]  Basic metabolic panel     Status: Abnormal   Collection Time: 12/05/18  6:23 AM  Result Value Ref Range   Sodium 136 135 - 145 mmol/L   Potassium 4.4 3.5 - 5.1 mmol/L   Chloride 107 98 - 111 mmol/L   CO2 21 (L) 22 - 32 mmol/L   Glucose, Bld 129 (H) 70 - 99 mg/dL   BUN 20 8 - 23 mg/dL   Creatinine, Ser 1.37 (H) 0.61 - 1.24 mg/dL   Calcium 8.9 8.9 - 10.3 mg/dL   GFR calc non Af Amer 51 (L) >60 mL/min   GFR calc Af Amer 59 (L) >60 mL/min   Anion gap 8 5 - 15    Comment: Performed at [facility], [address] [place], [place] [address]  CBC     Status: None   Collection Time: 12/05/18  6:23 AM  Result Value Ref Range   WBC 8.0 4.0 - 10.5 K/uL   RBC 4.85 4.22 - 5.81 MIL/uL   Hemoglobin 14.3 13.0 - 17.0 g/dL   HCT 42.6 39.0 - 52.0 %   MCV 87.8 80.0 - 100.0 fL   MCH 29.5 26.0 - 34.0 pg   MCHC 33.6 30.0 - 36.0 g/dL   RDW 13.5 11.5 - 15.5 %   Platelets 189 150 - 400 K/uL   nRBC 0.0 0.0 - 0.2 %    Comment: Performed at [facility], [address] [place], [place] [address]  Glucose, capillary     Status: Abnormal   Collection Time: 12/05/18  7:25 AM  Result Value  Ref Range   Glucose-Capillary 119 (H) 70 - 99 mg/dL  Glucose, capillary     Status: Abnormal   Collection Time: 12/05/18  7:54 AM  Result Value Ref Range   Glucose-Capillary 123 (H) 70 - 99 mg/dL  Glucose, capillary     Status: None   Collection Time: 12/05/18 12:35 PM  Result Value Ref Range   Glucose-Capillary 81 70 - 99 mg/dL    Dg Chest 2 View  Result Date: 12/04/2018 CLINICAL DATA:  Patient reports centralized chest pain, relief with 2 Nitro prior to arrival. Denies any associated symptoms. States he came to the hospital last week for the same reason. Hx of MI, HTN, diabetes, CAD. Former smoker. EXAM: CHEST - 2 VIEW COMPARISON:  11/24/2018 FINDINGS: Cardiac silhouette is normal in size. No mediastinal or hilar masses. No evidence of adenopathy. Mild stable areas of reticulonodular type lung scarring. No evidence of pneumonia or pulmonary edema. No pleural effusion or pneumothorax. Skeletal structures are intact. IMPRESSION: No active cardiopulmonary disease. Electronically Signed   By: Lajean Manes M.D.   On: 12/04/2018 17:56    Review of Systems  Constitutional: Positive for malaise/fatigue.  HENT: Positive for congestion.   Eyes: Negative.   Respiratory: Positive for shortness of breath.   Cardiovascular: Positive for chest pain.  Gastrointestinal: Negative.   Genitourinary: Negative.   Musculoskeletal: Negative.   Skin: Negative.   Neurological: Negative.   Endo/Heme/Allergies: Negative.   Psychiatric/Behavioral: Negative.    Blood pressure 136/80, pulse 66, temperature 98.6 F (37 C), temperature source Oral, resp. rate 16, height 5\' 9"  (1.753 m), weight 86.4 kg, SpO2 98 %. Physical Exam  Nursing note and vitals reviewed. Constitutional: He is oriented to person, place, and time. He appears well-developed and well-nourished.  HENT:  Head:  Normocephalic and atraumatic.  Eyes: Pupils are equal, round, and reactive to light. Conjunctivae and EOM are normal.  Neck: Normal range of motion. Neck supple.  Cardiovascular: Normal rate and regular rhythm.  Murmur heard. Respiratory: Effort normal and breath sounds normal.  GI: Soft. Bowel sounds are normal.  Musculoskeletal: Normal range of motion.  Neurological: He is alert and oriented to person, place, and time. He has normal reflexes.  Skin: Skin is warm.  Psychiatric: He has a normal mood and affect.    Assessment/Plan: Unstable angina Coronary artery disease Hypertension Hyper lipidemia Borderline obesity Chronic renal insufficiency stage II GERD Borderline hypertension Former smoker History of PCI and stent x2 . Plan Agree with admit rule out for myocardial infarction Follow-up cardiac enzymes and EKG Continue beta-blockade therapy Recommend aspirin consider adding Plavix Continue statin therapy moderate to high dose Low-dose ACE inhibitor for hypertension and renal protection Follow-up blood sugars and A1c's Recommend cardiac cath for assessment of coronary anatomy for unstable angina   D  12/05/2018, 2:13 PM

## 2018-12-05 NOTE — ED Notes (Signed)
ED TO INPATIENT HANDOFF REPORT  ED Nurse Name and Phone #: Avel Peace  S Name/Age/Gender Luke Rojas 73 y.o. male Room/Bed: ED02A/ED02A  Code Status   Code Status: Prior  Home/SNF/Other Home Patient oriented to: self, place, time and situation Is this baseline? Yes   Triage Complete: Triage complete  Chief Complaint CP  Triage Note Pt in via POV, reports centralized chest pain, relief with 2 Nitro prior to arrival.  Denies any associated symptoms.  NAD noted at this time.   Allergies Allergies  Allergen Reactions  . Codeine Nausea Only    Level of Care/Admitting Diagnosis ED Disposition    ED Disposition Condition [facility] Area: [place] [[id number]]  Level of Care: Telemetry [5]  Covid Evaluation: Asymptomatic Screening Protocol (No Symptoms)  Diagnosis: Chest pain [517616]  Admitting Physician: Lance Coon [[id number]]  Attending Physician: Lance Coon [[id number]]  Estimated length of stay: past midnight tomorrow  Certification:: I certify this patient will need inpatient services for at least 2 midnights  Bed request comments: 2a  PT Class (Do Not Modify): Inpatient [101]  PT Acc Code (Do Not Modify): Private [1]       B Medical/Surgery History Past Medical History:  Diagnosis Date  . Chronic kidney disease    kidney stones  . Coronary artery disease    2 stents   . Diabetes mellitus without complication ([place])   . GERD (gastroesophageal reflux disease)   . Hypercholesteremia   . Hypertension   . Myocardial infarction [facility]) 2013   Past Surgical History:  Procedure Laterality Date  . CARDIAC CATHETERIZATION  2013   2 stents  . COLONOSCOPY  04/27/05  . COLONOSCOPY WITH PROPOFOL N/A 06/08/2015   Procedure: COLONOSCOPY WITH PROPOFOL;  Surgeon: Robert Bellow, MD;  Location: [facility] ENDOSCOPY;  Service: Endoscopy;  Laterality: N/A;  . CYSTOSCOPY WITH STENT PLACEMENT Right 09/16/2016   Procedure: CYSTOSCOPY  WITH STENT PLACEMENT;  Surgeon: Royston Cowper, MD;  Location: [facility] ORS;  Service: Urology;  Laterality: Right;  . EXTRACORPOREAL SHOCK WAVE LITHOTRIPSY Left 11/04/2014   has had 2 previous lithotripsies  . EXTRACORPOREAL SHOCK WAVE LITHOTRIPSY Left 03/24/2015   Procedure: EXTRACORPOREAL SHOCK WAVE LITHOTRIPSY (ESWL);  Surgeon: Royston Cowper, MD;  Location: [facility] ORS;  Service: Urology;  Laterality: Left;  . EXTRACORPOREAL SHOCK WAVE LITHOTRIPSY Right 10/04/2016   Procedure: EXTRACORPOREAL SHOCK WAVE LITHOTRIPSY (ESWL);  Surgeon: Royston Cowper, MD;  Location: [facility] ORS;  Service: Urology;  Laterality: Right;     A IV Location/Drains/Wounds Patient Lines/Drains/Airways Status   Active Line/Drains/Airways    Name:   Placement date:   Placement time:   Site:   Days:   Peripheral IV 12/04/18 Right Hand   12/04/18    2107    Hand   1   Ureteral Drain/Stent Right ureter 6 Fr.   09/16/16    1204    Right ureter   810   Wound / Incision (Open or Dehisced) 03/24/15 Other (Comment) Back Left   03/24/15    1344    Back   1352          Intake/Output Last 24 hours No intake or output data in the 24 hours ending 12/05/18 0142  Labs/Imaging Results for orders placed or performed during the hospital encounter of 12/04/18 (from the past 48 hour(s))  Basic metabolic panel     Status: Abnormal   Collection Time: 12/04/18  5:28 PM  Result Value Ref  Range   Sodium 137 135 - 145 mmol/L   Potassium 3.9 3.5 - 5.1 mmol/L   Chloride 106 98 - 111 mmol/L   CO2 22 22 - 32 mmol/L   Glucose, Bld 126 (H) 70 - 99 mg/dL   BUN 21 8 - 23 mg/dL   Creatinine, Ser 1.41 (H) 0.61 - 1.24 mg/dL   Calcium 8.9 8.9 - 10.3 mg/dL   GFR calc non Af Amer 49 (L) >60 mL/min   GFR calc Af Amer 57 (L) >60 mL/min   Anion gap 9 5 - 15    Comment: Performed at [facility], [address] [place], [place] [address]  CBC     Status: None   Collection Time: 12/04/18  5:28 PM  Result Value Ref Range   WBC 7.8 4.0 - 10.5  K/uL   RBC 4.39 4.22 - 5.81 MIL/uL   Hemoglobin 13.2 13.0 - 17.0 g/dL   HCT 39.0 39.0 - 52.0 %   MCV 88.8 80.0 - 100.0 fL   MCH 30.1 26.0 - 34.0 pg   MCHC 33.8 30.0 - 36.0 g/dL   RDW 13.8 11.5 - 15.5 %   Platelets 186 150 - 400 K/uL   nRBC 0.0 0.0 - 0.2 %    Comment: Performed at [facility], [place], [place] [address]  Troponin I (High Sensitivity)     Status: None   Collection Time: 12/04/18  5:28 PM  Result Value Ref Range   Troponin I (High Sensitivity) 4 <18 ng/L    Comment: (NOTE) Elevated high sensitivity troponin I (hsTnI) values and significant  changes across serial measurements may suggest ACS but many other  chronic and acute conditions are known to elevate hsTnI results.  Refer to the "Links" section for chest pain algorithms and additional  guidance. Performed at [facility], [place], [place] [address]   Troponin I (High Sensitivity)     Status: None   Collection Time: 12/04/18  9:07 PM  Result Value Ref Range   Troponin I (High Sensitivity) 6 <18 ng/L    Comment: (NOTE) Elevated high sensitivity troponin I (hsTnI) values and significant  changes across serial measurements may suggest ACS but many other  chronic and acute conditions are known to elevate hsTnI results.  Refer to the "Links" section for chest pain algorithms and additional  guidance. Performed at [facility], [address] [place], [place] [address]   SARS Coronavirus 2 [facility] order, Performed in [facility] hospital lab) Nasopharyngeal Nasopharyngeal Swab     Status: None   Collection Time: 12/04/18 10:16 PM   Specimen: Nasopharyngeal Swab  Result Value Ref Range   SARS Coronavirus 2 NEGATIVE NEGATIVE    Comment: (NOTE) If result is NEGATIVE SARS-CoV-2 target nucleic acids are NOT DETECTED. The SARS-CoV-2 RNA is generally detectable in upper and lower  respiratory specimens during the acute phase of infection. The lowest   concentration of SARS-CoV-2 viral copies this assay can detect is 250  copies / mL. A negative result does not preclude SARS-CoV-2 infection  and should not be used as the sole basis for treatment or other  patient management decisions.  A negative result may occur with  improper specimen collection / handling, submission of specimen other  than nasopharyngeal swab, presence of viral mutation(s) within the  areas targeted by this assay, and inadequate number of viral copies  (<250 copies / mL). A negative result must be combined with  clinical  observations, patient history, and epidemiological information. If result is POSITIVE SARS-CoV-2 target nucleic acids are DETECTED. The SARS-CoV-2 RNA is generally detectable in upper and lower  respiratory specimens dur ing the acute phase of infection.  Positive  results are indicative of active infection with SARS-CoV-2.  Clinical  correlation with patient history and other diagnostic information is  necessary to determine patient infection status.  Positive results do  not rule out bacterial infection or co-infection with other viruses. If result is PRESUMPTIVE POSTIVE SARS-CoV-2 nucleic acids MAY BE PRESENT.   A presumptive positive result was obtained on the submitted specimen  and confirmed on repeat testing.  While 2019 novel coronavirus  (SARS-CoV-2) nucleic acids may be present in the submitted sample  additional confirmatory testing may be necessary for epidemiological  and / or clinical management purposes  to differentiate between  SARS-CoV-2 and other Sarbecovirus currently known to infect humans.  If clinically indicated additional testing with an alternate test  methodology [phone]) is advised. The SARS-CoV-2 RNA is generally  detectable in upper and lower respiratory sp ecimens during the acute  phase of infection. The expected result is Negative. Fact Sheet for Patients:  StrictlyIdeas.no Fact Sheet  for Healthcare Providers: BankingDealers.co.za This test is not yet approved or cleared by the [place] FDA and has been authorized for detection and/or diagnosis of SARS-CoV-2 by FDA under an Emergency Use Authorization (EUA).  This EUA will remain in effect (meaning this test can be used) for the duration of the COVID-19 declaration under Section 564(b)(1) of the Act, 21 U.S.C. section 360bbb-3(b)(1), unless the authorization is terminated or revoked sooner. Performed at [facility], [address] [place], [place] [address]   Magnesium     Status: None   Collection Time: 12/04/18 10:20 PM  Result Value Ref Range   Magnesium 1.9 1.7 - 2.4 mg/dL    Comment: Performed at [facility], [address] [place], [place] [address]   Dg Chest 2 View  Result Date: 12/04/2018 CLINICAL DATA:  Patient reports centralized chest pain, relief with 2 Nitro prior to arrival. Denies any associated symptoms. States he came to the hospital last week for the same reason. Hx of MI, HTN, diabetes, CAD. Former smoker. EXAM: CHEST - 2 VIEW COMPARISON:  11/24/2018 FINDINGS: Cardiac silhouette is normal in size. No mediastinal or hilar masses. No evidence of adenopathy. Mild stable areas of reticulonodular type lung scarring. No evidence of pneumonia or pulmonary edema. No pleural effusion or pneumothorax. Skeletal structures are intact. IMPRESSION: No active cardiopulmonary disease. Electronically Signed   By: Lajean Manes M.D.   On: 12/04/2018 17:56    Pending Labs [employer] (From admission, onward)    Start     Ordered   Signed and Held  CBC  (enoxaparin (LOVENOX)    CrCl >/= 30 ml/min)  Once,   R    Comments: Baseline for enoxaparin therapy IF NOT ALREADY DRAWN.  Notify MD if PLT < 100 K.    Signed and Held   Signed and Held  Creatinine, serum  (enoxaparin (LOVENOX)    CrCl >/= 30 ml/min)  Once,   R    Comments: Baseline for enoxaparin therapy IF NOT  ALREADY DRAWN.    Signed and Held   Signed and Held  Creatinine, serum  (enoxaparin (LOVENOX)    CrCl >/= 30 ml/min)  Weekly,   R    Comments: while on enoxaparin therapy    Signed and Held  Signed and Held  Basic metabolic panel  Tomorrow morning,   R     Signed and Held   Signed and Held  CBC  Tomorrow morning,   R     Signed and Held          Vitals/Pain Today's Vitals   12/04/18 1818 12/04/18 2200 12/04/18 2300 12/05/18 0030  BP: (!) 157/72 (!) 142/55 (!) 138/59   Pulse: 66 65 (!) 58   Resp: 18 19 19 15   Temp: 98.2 F (36.8 C)     TempSrc: Oral     SpO2: 98% 100% 96%   Weight:      Height:      PainSc:        Isolation Precautions No active isolations  Medications Medications  aspirin chewable tablet 243 mg (243 mg Oral Given 12/04/18 2140)  metoprolol tartrate (LOPRESSOR) tablet 25 mg (25 mg Oral Given 12/04/18 2217)    Mobility walks Low fall risk   Focused Assessments Cardiac Assessment Handoff:    No results found for: CKTOTAL, CKMB, CKMBINDEX, TROPONINI No results found for: DDIMER Does the Patient currently have chest pain? No      R Recommendations: See Admitting Provider Note  Report given to:   Additional Notes:

## 2018-12-08 ENCOUNTER — Telehealth: Payer: Self-pay

## 2018-12-08 NOTE — Discharge Summary (Signed)
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 Chronic kidney disease    kidney stones  . Coronary artery disease    2 stents   . Diabetes mellitus without complication ([place])   . GERD (gastroesophageal reflux disease)   . Hypercholesteremia   . Hypertension   . Myocardial infarction [facility]) 2013   HOSPITAL COURSE:  73 y m admitted for chest pain  * Unstable Angina/ACS -patient's chest pain has resolved at this time.  S/p cath on 10/2 showing Multivessel coronary artery disease, Widely patent left main mild to moderate calcium LAD extensive calcification moderate to severe 50% mid and proximal lesion 70% focal mid lesion, Circumflex large widely patent mid stent no significant obstructive disease, RCA medium size multiple lesions proximal mid distal widely patent proximal stent. - Cardio recommends outpt referral to tertiary care center for evaluation of possible high risk multivessel complex intervention versus coronary bypass surgery. Case referred to [facility] and discussed with Dr. Darrold Span by Dr Clayborn Bigness - DAPT, Imdur, statin, b-blocker, ACE-I DISCHARGE CONDITIONS:  Stable CONSULTS OBTAINED:   DRUG ALLERGIES:   Allergies   Allergen Reactions  . Codeine Nausea Only   DISCHARGE MEDICATIONS:   Allergies as of 12/05/2018      Reactions   Codeine Nausea Only      Medication List    STOP taking these medications   meloxicam 15 MG tablet Commonly known as: MOBIC     TAKE these medications   amLODipine 5 MG tablet Commonly known as: NORVASC Take 5 mg by mouth daily.   aspirin EC 81 MG tablet Take 81 mg by mouth daily.   clopidogrel 75 MG tablet Commonly known as: Plavix Take 1 tablet (75 mg total) by mouth daily.   co-enzyme Q-10 30 MG capsule Take 30 mg by mouth daily.   diclofenac sodium 1 % Gel Commonly known as: VOLTAREN Apply 2 g topically 4 (four) times daily.   Fish Oil 1200 MG Caps Take 1 capsule by mouth daily.   isosorbide mononitrate 60 MG 24 hr tablet Commonly known as: IMDUR Take 60 mg by mouth daily.   Krill Oil 1000 MG Caps Take 1 mg by mouth daily.   Lipitor 40 MG tablet Generic drug: atorvastatin Take 1 tablet by mouth at bedtime.   metoprolol tartrate 25 MG tablet Commonly known as: LOPRESSOR Take 1 tablet (25 mg total) by mouth 2 (two) times daily.   MULTIPLE VITAMIN PO Take 1 tablet by mouth daily.   nitroGLYCERIN 0.4 MG SL tablet Commonly known as: Nitrostat Place 1 tablet (0.4 mg total) under the tongue every 5 (five) minutes as needed for chest pain.   omeprazole 20 MG capsule Commonly known as: PRILOSEC Take 20 mg by mouth daily.  ramipril 10 MG capsule Commonly known as: ALTACE Take 1 capsule (10 mg total) by mouth daily.      DISCHARGE INSTRUCTIONS:   DIET:  Cardiac diet DISCHARGE CONDITION:  Fair ACTIVITY:  Activity as tolerated OXYGEN:  Home Oxygen: No.  Oxygen Delivery: room air DISCHARGE LOCATION:  home   If you experience worsening of your admission symptoms, develop shortness of breath, life threatening emergency, suicidal or homicidal thoughts you must seek medical attention immediately by calling 911 or calling your MD  immediately  if symptoms less severe.  You Must read complete instructions/literature along with all the possible adverse reactions/side effects for all the Medicines you take and that have been prescribed to you. Take any new Medicines after you have completely understood and accpet all the possible adverse reactions/side effects.   Please note  You were cared for by a hospitalist during your hospital stay. If you have any questions about your discharge medications or the care you received while you were in the hospital after you are discharged, you can call the unit and asked to speak with the hospitalist on call if the hospitalist that took care of you is not available. Once you are discharged, your primary care physician will handle any further medical issues. Please note that NO REFILLS for any discharge medications will be authorized once you are discharged, as it is imperative that you return to your primary care physician (or establish a relationship with a primary care physician if you do not have one) for your aftercare needs so that they can reassess your need for medications and monitor your lab values.    On the day of Discharge:  VITAL SIGNS:  Blood pressure (!) 139/56, pulse 77, temperature 99.2 F (37.3 C), temperature source Oral, resp. rate 18, height 5\' 9"  (1.753 m), weight 86.4 kg, SpO2 95 %. PHYSICAL EXAMINATION:  GENERAL:  73 y.o.-year-old patient lying in the bed with no acute distress.  EYES: Pupils equal, round, reactive to light and accommodation. No scleral icterus. Extraocular muscles intact.  HEENT: Head atraumatic, normocephalic. Oropharynx and nasopharynx clear.  NECK:  Supple, no jugular venous distention. No thyroid enlargement, no tenderness.  LUNGS: Normal breath sounds bilaterally, no wheezing, rales,rhonchi or crepitation. No use of accessory muscles of respiration.  CARDIOVASCULAR: S1, S2 normal. No murmurs, rubs, or gallops.  ABDOMEN: Soft, non-tender,  non-distended. Bowel sounds present. No organomegaly or mass.  EXTREMITIES: No pedal edema, cyanosis, or clubbing.  NEUROLOGIC: Cranial nerves II through XII are intact. Muscle strength 5/5 in all extremities. Sensation intact. Gait not checked.  PSYCHIATRIC: The patient is alert and oriented x 3.  SKIN: No obvious rash, lesion, or ulcer.  DATA REVIEW:   CBC Recent Labs  Lab 12/05/18 0623  WBC 8.0  HGB 14.3  HCT 42.6  PLT 189    Chemistries  Recent Labs  Lab 12/02/18 0817  12/04/18 2220 12/05/18 0623  NA 139   < >  --  136  K 4.7   < >  --  4.4  CL 104   < >  --  107  CO2 20   < >  --  21*  GLUCOSE 124*   < >  --  129*  BUN 18   < >  --  20  CREATININE 1.38*   < >  --  1.37*  CALCIUM 8.9   < >  --  8.9  MG  --   --  1.9  --  AST 24  --   --   --   ALT 35  --   --   --   ALKPHOS 73  --   --   --   BILITOT 0.5  --   --   --    < > = values in this interval not displayed.     Follow-up Information    Chrismon, Vickki Muff, [place]. Schedule an appointment as soon as possible for a visit in 1 week(s).   Specialty: Family Medicine Contact information: [place] [id number] [place], [place]. Schedule an appointment as soon as possible for a visit in 1 week(s).   Specialty: Internal Medicine Contact information: [place] [address] [place] [address] [phone]           Management plans discussed with the patient, family and they are in agreement.  CODE STATUS: Prior   TOTAL TIME TAKING CARE OF THIS PATIENT: 45 minutes.    Max Sane M.D on 12/08/2018 at 1:25 PM  Between 7am to 6pm - Pager - [phone]  After 6pm go to [email or web address] - Proofreader  [facility]  Office  [phone]  CC: Primary care physician; Margo Common, PA   Note: This dictation was prepared with Dragon dictation along with smaller phrase technology. Any transcriptional errors that result  from this process are unintentional.

## 2018-12-08 NOTE — Telephone Encounter (Signed)
Transition Care Management Follow-up Telephone Call  Date of discharge and from where: [facility] on 12/05/18.  How have you been since you were released from the hospital? Doing ok, still feels weak. Gets an occasional headache but was advised this would happen due to medication changes. Declines chest pain, SOB, fever or n/v/d.   Any questions or concerns? No   Items Reviewed:  Did the pt receive and understand the discharge instructions provided? Yes   Medications obtained and verified? No, declined reviewing meds over the phone.  Any new allergies since your discharge? No   Dietary orders reviewed? Yes  Do you have support at home? Yes   Other (ie: DME, Home Health, etc) N/A  Functional Questionnaire: (I = Independent and D = Dependent)  Bathing/Dressing- I   Meal Prep- I  Eating- I  Maintaining continence- I  Transferring/Ambulation- I  Managing Meds- Wife manages medications.    Follow up appointments reviewed:    PCP Hospital f/u appt confirmed? No , pt declined scheduling a HFU at this time. Pt wants to speak with Dr. Darrold Span first. Vertis Kelch is scheduled with him on 12/11/18.  [facility] f/u appt confirmed? Yes    Are transportation arrangements needed? No   If their condition worsens, is the pt aware to call  their PCP or go to the ED? Yes  Was the patient provided with contact information for the PCP's office or ED? Yes  Was the pt encouraged to call back with questions or concerns? Yes

## 2018-12-08 NOTE — Telephone Encounter (Signed)
No HFU scheduled.  

## 2018-12-08 NOTE — Telephone Encounter (Signed)
Acknowledged.

## 2018-12-09 LAB — POCT ACTIVATED CLOTTING TIME: Activated Clotting Time: 274 seconds

## 2018-12-19 DIAGNOSIS — Z95820 Peripheral vascular angioplasty status with implants and grafts: Secondary | ICD-10-CM | POA: Insufficient documentation

## 2018-12-19 HISTORY — PX: CORONARY ANGIOPLASTY WITH STENT PLACEMENT: SHX49

## 2018-12-31 ENCOUNTER — Other Ambulatory Visit: Payer: Self-pay

## 2018-12-31 ENCOUNTER — Ambulatory Visit (INDEPENDENT_AMBULATORY_CARE_PROVIDER_SITE_OTHER): Payer: Medicare Other

## 2018-12-31 DIAGNOSIS — Z23 Encounter for immunization: Secondary | ICD-10-CM | POA: Diagnosis not present

## 2019-01-15 ENCOUNTER — Encounter: Payer: Self-pay | Admitting: Family Medicine

## 2019-01-15 ENCOUNTER — Other Ambulatory Visit: Payer: Self-pay

## 2019-01-15 ENCOUNTER — Ambulatory Visit (INDEPENDENT_AMBULATORY_CARE_PROVIDER_SITE_OTHER): Payer: Medicare Other | Admitting: Family Medicine

## 2019-01-15 VITALS — BP 100/60 | HR 66 | Temp 97.3°F | Wt 188.0 lb

## 2019-01-15 DIAGNOSIS — M47816 Spondylosis without myelopathy or radiculopathy, lumbar region: Secondary | ICD-10-CM

## 2019-01-15 DIAGNOSIS — I2510 Atherosclerotic heart disease of native coronary artery without angina pectoris: Secondary | ICD-10-CM

## 2019-01-15 DIAGNOSIS — Z9861 Coronary angioplasty status: Secondary | ICD-10-CM

## 2019-01-15 NOTE — Progress Notes (Signed)
Luke Rojas  MRN: [id number] DOB: January 15, 1946  Subjective:  HPI   The patient is a 73 year old male who presents with back pain.  He states he had been using Mobic for the pain and got relief.  He states that recently while at the hospital they took him off of it.  He has now been having increased pain since stopping the Mobic.  It is of note that patient recently had stent re-placement and was put on Plavix.  When asked if this was when they discontinued his Mobic he said yes.    Patient Active Problem List   Diagnosis Date Noted  . Diabetes ([place]) 12/04/2018  . Chest pain 11/24/2018  . Coronary artery disease involving native coronary artery without angina pectoris 08/05/2017  . Lumbar radiculopathy (R-sided) L4 07/04/2017  . 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 Lumbar facet arthropathy 07/04/2017  . 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 Foraminal stenosis of thoracic region 07/04/2017  . Chronic pain syndrome 07/04/2017  . Encounter for screening colonoscopy 05/04/2015  . Arthritis 10/08/2014  . H/O acute myocardial infarction 10/08/2014  . Acid reflux 10/08/2014  . HLD (hyperlipidemia) 10/08/2014  . Benign neoplasm of skin 07/19/2006  . Essential (primary) hypertension 11/15/2005  . Decreased potassium in the blood 11/15/2005    Past Medical History:  Diagnosis Date  . Chronic kidney disease    kidney stones  . Coronary artery disease    2 stents   . Diabetes mellitus without complication ([place])   . GERD (gastroesophageal reflux disease)   . Hypercholesteremia   . Hypertension   . Myocardial infarction [facility]) 2013    Social History   Socioeconomic History  . Marital status: Married    Spouse name: Not on file  . Number of children: 2  . Years of education: Not on file  . Highest education level: 8th grade  Occupational History  . Occupation: retired  Scientific laboratory technician  . Financial resource strain: Not hard at all  . Food  insecurity    Worry: Never true    Inability: Never true  . Transportation needs    Medical: No    Non-medical: No  Tobacco Use  . Smoking status: Former Smoker    Packs/day: 1.00    Years: 30.00    Pack years: 30.00    Types: Cigarettes    Quit date: 03/05/1984    Years since quitting: 34.8  . Smokeless tobacco: Current User    Types: Chew  Substance and Sexual Activity  . Alcohol use: No    Alcohol/week: 0.0 standard drinks  . Drug use: No  . Sexual activity: Not on file  Lifestyle  . Physical activity    Days per week: Not on file    Minutes per session: Not on file  . Stress: Not at all  Relationships  . Social Herbalist on phone: Not on file    Gets together: Not on file    Attends religious service: Not on file    Active member of club or organization: Not on file    Attends meetings of clubs or organizations: Not on file    Relationship status: Not on file  . Intimate partner violence    Fear of current or ex partner: Not on file    Emotionally abused: Not on file    Physically abused: Not on file    Forced sexual activity: Not on file  Other  Topics Concern  . Not on file  Social History Narrative  . Not on file    Outpatient Encounter Medications as of 01/15/2019  Medication Sig  . amLODipine (NORVASC) 5 MG tablet Take 5 mg by mouth daily.   Marland Kitchen aspirin EC 81 MG tablet Take 81 mg by mouth daily.  Marland Kitchen atorvastatin (LIPITOR) 40 MG tablet Take 1 tablet by mouth at bedtime.   . clopidogrel (PLAVIX) 75 MG tablet Take 1 tablet (75 mg total) by mouth daily.  Marland Kitchen co-enzyme Q-10 30 MG capsule Take 30 mg by mouth daily.   . diclofenac sodium (VOLTAREN) 1 % GEL Apply 2 g topically 4 (four) times daily.  . isosorbide mononitrate (IMDUR) 60 MG 24 hr tablet Take 60 mg by mouth daily.  Javier Docker Oil 1000 MG CAPS Take 1 mg by mouth daily.  . metoprolol tartrate (LOPRESSOR) 25 MG tablet Take 1 tablet (25 mg total) by mouth 2 (two) times daily.  . MULTIPLE VITAMIN PO  Take 1 tablet by mouth daily.  . nitroGLYCERIN (NITROSTAT) 0.4 MG SL tablet Place 1 tablet (0.4 mg total) under the tongue every 5 (five) minutes as needed for chest pain.  . Omega-3 Fatty Acids (FISH OIL) 1200 MG CAPS Take 1 capsule by mouth daily.   Marland Kitchen omeprazole (PRILOSEC) 20 MG capsule Take 20 mg by mouth daily.   . ramipril (ALTACE) 10 MG capsule Take 1 capsule (10 mg total) by mouth daily.   No facility-administered encounter medications on file as of 01/15/2019.     Allergies  Allergen Reactions  . Codeine Nausea Only    Review of Systems  Constitutional: Negative for chills, diaphoresis, fever and malaise/fatigue.  HENT: Negative for congestion, ear pain, sinus pain and sore throat.   Respiratory: Negative for cough.   Cardiovascular: Negative for chest pain.  Gastrointestinal: Negative for abdominal pain and diarrhea.  Musculoskeletal: Positive for back pain. Negative for myalgias.  Neurological: Negative for headaches.    Objective:  BP 100/60 (BP Location: Right Arm, Patient Position: Sitting, Cuff Size: Normal)   Pulse 66   Temp (!) 97.3 F (36.3 C) (Skin)   Wt 188 lb (85.3 kg)   SpO2 96%   BMI 27.76 kg/m   Physical Exam  Constitutional: He is oriented to person, place, and time and well-developed, well-nourished, and in no distress.  HENT:  Head: Normocephalic.  Eyes: Conjunctivae are normal.  Neck: Neck supple.  Cardiovascular: Normal rate.  Pulmonary/Chest: Effort normal and breath sounds normal.  Abdominal: Soft. Bowel sounds are normal.  Musculoskeletal: Normal range of motion.  Neurological: He is alert and oriented to person, place, and time.  Skin: No rash noted.  Multiple bruises and a skin tear on the left forearm.  Psychiatric: Mood, affect and judgment normal.    Assessment and Plan :  1. Lumbar facet arthropathy Severe degenerative changes of lumbar spine with severe bilateral facet hypertrophy on MRI of 05-23-17. Was stable on Mobic 15 mg qd,  but, it had to be stopped with start of Plavix with ASA for coronary stents. Has been using some Voltaren or Aspercreme topical treatments and very little discomfort today. Decided to continue this regimen and add a little Tylenol prn discomfort until he can come off the Plavix with ASA regimen. Apply moist heat prn and limit repetitive work or heavy lifting.  2. Coronary artery disease involving native coronary artery of native heart without angina pectoris Had worsening angina over 6 months prior to PTCA. Coronary angiogram  on 12-05-18 showed proximal RCA 80% stenosis, mid RCA 70%, distal RCA 90%, mid LAD 75% and ost LAD to prox LAD 50%. No further chest pain since stents placed and taking Imdur 60 mg qd with Metoprolol Tartrate 25 mg BID and Amlodipine 5 mg qd. Followed by Dr. Clayborn Bigness (cardiologist) regularly.  3. History of percutaneous transluminal coronary angioplasty Had 3 Synergy drug-eluting stents to the RCA and 2 to the LAD by Dr. Darrold Span (interventional cardiologist at [facility]) on 12-19-18 with 0% residual stenosis. Presently on Plavix 75 mg qd and ASA 81 mg qd antiplatelet therapy.

## 2019-01-16 ENCOUNTER — Emergency Department
Admission: EM | Admit: 2019-01-16 | Discharge: 2019-01-16 | Disposition: A | Discharge status: Home / Self Care | Payer: Medicare Other | Attending: Emergency Medicine | Admitting: Emergency Medicine

## 2019-01-16 ENCOUNTER — Emergency Department: Payer: Medicare Other

## 2019-01-16 ENCOUNTER — Other Ambulatory Visit: Payer: Self-pay

## 2019-01-16 DIAGNOSIS — Z79899 Other long term (current) drug therapy: Secondary | ICD-10-CM | POA: Insufficient documentation

## 2019-01-16 DIAGNOSIS — Z7982 Long term (current) use of aspirin: Secondary | ICD-10-CM | POA: Diagnosis not present

## 2019-01-16 DIAGNOSIS — I129 Hypertensive chronic kidney disease with stage 1 through stage 4 chronic kidney disease, or unspecified chronic kidney disease: Secondary | ICD-10-CM | POA: Diagnosis not present

## 2019-01-16 DIAGNOSIS — I2510 Atherosclerotic heart disease of native coronary artery without angina pectoris: Secondary | ICD-10-CM | POA: Diagnosis not present

## 2019-01-16 DIAGNOSIS — R0789 Other chest pain: Secondary | ICD-10-CM | POA: Insufficient documentation

## 2019-01-16 DIAGNOSIS — R079 Chest pain, unspecified: Secondary | ICD-10-CM

## 2019-01-16 DIAGNOSIS — N189 Chronic kidney disease, unspecified: Secondary | ICD-10-CM | POA: Insufficient documentation

## 2019-01-16 DIAGNOSIS — E119 Type 2 diabetes mellitus without complications: Secondary | ICD-10-CM | POA: Insufficient documentation

## 2019-01-16 DIAGNOSIS — Z87891 Personal history of nicotine dependence: Secondary | ICD-10-CM | POA: Insufficient documentation

## 2019-01-16 LAB — CBC
HCT: 41.4 % (ref 39.0–52.0)
Hemoglobin: 14.2 g/dL (ref 13.0–17.0)
MCH: 29.2 pg (ref 26.0–34.0)
MCHC: 34.3 g/dL (ref 30.0–36.0)
MCV: 85 fL (ref 80.0–100.0)
Platelets: 207 10*3/uL (ref 150–400)
RBC: 4.87 MIL/uL (ref 4.22–5.81)
RDW: 13.7 % (ref 11.5–15.5)
WBC: 8.5 10*3/uL (ref 4.0–10.5)
nRBC: 0 % (ref 0.0–0.2)

## 2019-01-16 LAB — BASIC METABOLIC PANEL
Anion gap: 11 (ref 5–15)
BUN: 18 mg/dL (ref 8–23)
CO2: 24 mmol/L (ref 22–32)
Calcium: 9.1 mg/dL (ref 8.9–10.3)
Chloride: 104 mmol/L (ref 98–111)
Creatinine, Ser: 1.29 mg/dL — ABNORMAL HIGH (ref 0.61–1.24)
GFR calc Af Amer: >60 mL/min (ref >60)
GFR calc non Af Amer: 54 mL/min — ABNORMAL LOW (ref >60)
Glucose, Bld: 112 mg/dL — ABNORMAL HIGH (ref 70–99)
Potassium: 3.9 mmol/L (ref 3.5–5.1)
Sodium: 139 mmol/L (ref 135–145)

## 2019-01-16 LAB — GLUCOSE, CAPILLARY: Glucose-Capillary: 88 mg/dL (ref 70–99)

## 2019-01-16 LAB — TROPONIN I (HIGH SENSITIVITY)
Troponin I (High Sensitivity): 6 ng/L (ref <18)
Troponin I (High Sensitivity): 5 ng/L (ref <18)
Troponin I (High Sensitivity): 6 ng/L (ref <18)

## 2019-01-16 NOTE — ED Provider Notes (Signed)
[facility] Emergency Department Provider Note   ____________________________________________   First MD Initiated Contact with Patient 01/16/19 2021     (approximate)  I have reviewed the triage vital signs and the nursing notes.   HISTORY  Chief Complaint Chest Pain    HPI JOSE CORVIN is a 73 y.o. male patient has had 2 stents years ago which recently were found to be partly occluded.  He had percutaneous revascularization with drug-eluting stents and cleaning out of the occluded or partially occluded stents.  This was done October 16.  Since then he has been doing well.  However tonight he had about an hour and a half of chest pressure and pain going from the upper chest down into the upper abdomen.  It was in a different location in his usual pain and went away spontaneously.  Nitro did not seem to help.  Patient is now back to baseline and feels good.  Pain might have been similar in quality but different in location to his heart pain.        Past Medical History:  Diagnosis Date  . Chronic kidney disease    kidney stones  . Coronary artery disease    2 stents   . Diabetes mellitus without complication ([place])   . GERD (gastroesophageal reflux disease)   . Hypercholesteremia   . Hypertension   . Myocardial infarction [facility]) 2013    Patient Active Problem List   Diagnosis Date Noted  . Diabetes ([place]) 12/04/2018  . Chest pain 11/24/2018  . Coronary artery disease involving native coronary artery without angina pectoris 08/05/2017  . Lumbar radiculopathy (R-sided) L4 07/04/2017  . 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 Lumbar facet arthropathy 07/04/2017  . 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 Foraminal stenosis of thoracic region 07/04/2017  . Chronic pain syndrome 07/04/2017  . Encounter for screening colonoscopy 05/04/2015  . Arthritis 10/08/2014  . H/O acute myocardial infarction 10/08/2014  . Acid  reflux 10/08/2014  . HLD (hyperlipidemia) 10/08/2014  . Benign neoplasm of skin 07/19/2006  . Essential (primary) hypertension 11/15/2005  . Decreased potassium in the blood 11/15/2005    Past Surgical History:  Procedure Laterality Date  . CARDIAC CATHETERIZATION  2013   2 stents  . COLONOSCOPY  04/27/05  . COLONOSCOPY WITH PROPOFOL N/A 06/08/2015   Procedure: COLONOSCOPY WITH PROPOFOL;  Surgeon: Robert Bellow, MD;  Location: [facility] ENDOSCOPY;  Service: Endoscopy;  Laterality: N/A;  . CYSTOSCOPY WITH STENT PLACEMENT Right 09/16/2016   Procedure: CYSTOSCOPY WITH STENT PLACEMENT;  Surgeon: Royston Cowper, MD;  Location: [facility] ORS;  Service: Urology;  Laterality: Right;  . EXTRACORPOREAL SHOCK WAVE LITHOTRIPSY Left 11/04/2014   has had 2 previous lithotripsies  . EXTRACORPOREAL SHOCK WAVE LITHOTRIPSY Left 03/24/2015   Procedure: EXTRACORPOREAL SHOCK WAVE LITHOTRIPSY (ESWL);  Surgeon: Royston Cowper, MD;  Location: [facility] ORS;  Service: Urology;  Laterality: Left;  . EXTRACORPOREAL SHOCK WAVE LITHOTRIPSY Right 10/04/2016   Procedure: EXTRACORPOREAL SHOCK WAVE LITHOTRIPSY (ESWL);  Surgeon: Royston Cowper, MD;  Location: [facility] ORS;  Service: Urology;  Laterality: Right;  . LEFT HEART CATH AND CORONARY ANGIOGRAPHY N/A 12/05/2018   Procedure: LEFT HEART CATH AND CORONARY ANGIOGRAPHY with possible pci and stent;  Surgeon: Yolonda Kida, MD;  Location: [facility];  Service: Cardiovascular;  Laterality: N/A;    Prior to Admission medications   Medication Sig Start Date End Date Taking? Authorizing Provider  amLODipine (NORVASC) 5  MG tablet Take 5 mg by mouth daily.  03/10/18 03/10/19  [provider]  aspirin EC 81 MG tablet Take 81 mg by mouth daily.    [provider]  atorvastatin (LIPITOR) 40 MG tablet Take 1 tablet by mouth at bedtime.  09/29/11   [provider]  clopidogrel (PLAVIX) 75 MG tablet Take 1 tablet (75 mg total) by mouth daily. 12/05/18 12/05/19   Max Sane, MD  co-enzyme Q-10 30 MG capsule Take 30 mg by mouth daily.     [provider]  diclofenac sodium (VOLTAREN) 1 % GEL Apply 2 g topically 4 (four) times daily. 07/08/17   Chrismon, Vickki Muff, PA  isosorbide mononitrate (IMDUR) 60 MG 24 hr tablet Take 60 mg by mouth daily.    [provider]  Javier Docker Oil 1000 MG CAPS Take 1 mg by mouth daily.    [provider]  metoprolol tartrate (LOPRESSOR) 25 MG tablet Take 1 tablet (25 mg total) by mouth 2 (two) times daily. 11/25/18   Gladstone Lighter, MD  MULTIPLE VITAMIN PO Take 1 tablet by mouth daily.    [provider]  nitroGLYCERIN (NITROSTAT) 0.4 MG SL tablet Place 1 tablet (0.4 mg total) under the tongue every 5 (five) minutes as needed for chest pain. 11/25/18   Gladstone Lighter, MD  Omega-3 Fatty Acids (FISH OIL) 1200 MG CAPS Take 1 capsule by mouth daily.     [provider]  omeprazole (PRILOSEC) 20 MG capsule Take 20 mg by mouth daily.  01/26/15   [provider]  ramipril (ALTACE) 10 MG capsule Take 1 capsule (10 mg total) by mouth daily. Patient taking differently: Take 20 mg by mouth daily.  11/25/18   Gladstone Lighter, MD    Allergies Codeine  Family History  Problem Relation Age of Onset  . Pulmonary embolism Mother   . Transient ischemic attack Mother   . Diabetes Mother   . Pancreatic cancer Father   . Hypertension Father   . Diabetes Father   . Cirrhosis Brother     Social History Social History   Tobacco Use  . Smoking status: Former Smoker    Packs/day: 1.00    Years: 30.00    Pack years: 30.00    Types: Cigarettes    Quit date: 03/05/1984    Years since quitting: 34.8  . Smokeless tobacco: Current User    Types: Chew  Substance Use Topics  . Alcohol use: No    Alcohol/week: 0.0 standard drinks  . Drug use: No    Review of Systems Currently Constitutional: No fever/chills Eyes: No visual changes. ENT: No sore throat. Cardiovascular: Denies  chest pain. Respiratory: Denies shortness of breath. Gastrointestinal: No abdominal pain.  No nausea, no vomiting.  No diarrhea.  No constipation. Genitourinary: Negative for dysuria. Musculoskeletal: Negative for back pain. Skin: Negative for rash. Neurological: Negative for headaches, focal weakness   ____________________________________________   PHYSICAL EXAM:  VITAL SIGNS: ED Triage Vitals [01/16/19 1420]  Enc Vitals Group     BP 100/81     Pulse Rate (!) 58     Resp 18     Temp 98 F (36.7 C)     Temp Source Oral     SpO2 97 %     Weight 185 lb (83.9 kg)     Height 5\' 9"  (1.753 m)     Head Circumference      Peak Flow      Pain Score 10  Pain Loc      Pain Edu?      Excl. in [place]?     Constitutional: Alert and oriented. Well appearing and in no acute distress. Eyes: Conjunctivae are normal.  Head: Atraumatic. Nose: No congestion/rhinnorhea. Mouth/Throat: Mucous membranes are moist.  Oropharynx non-erythematous. Neck: No stridor.   Cardiovascular: Normal rate, regular rhythm. Grossly normal heart sounds.  Good peripheral circulation. Respiratory: Normal respiratory effort.  No retractions. Lungs CTAB. Gastrointestinal: Soft and nontender. No distention. No abdominal bruits. No CVA tenderness. Musculoskeletal: No lower extremity tenderness nor edema.  No joint effusions. Neurologic:  Normal speech and language. No gross focal neurologic deficits are appreciated. No gait instability. Skin:  Skin is warm, dry and intact. No rash noted.   ____________________________________________   LABS (all labs ordered are listed, but only abnormal results are displayed)  Labs Reviewed  BASIC METABOLIC PANEL - Abnormal; Notable for the following components:      Result Value   Glucose, Bld 112 (*)    Creatinine, Ser 1.29 (*)    GFR calc non Af Amer 54 (*)    All other components within normal limits  CBC  GLUCOSE, CAPILLARY  TROPONIN I (HIGH SENSITIVITY)  TROPONIN  I (HIGH SENSITIVITY)  TROPONIN I (HIGH SENSITIVITY)   ____________________________________________  EKG EKG read interpreted by me shows normal sinus rhythm at 64 normal axis no acute ST-T wave changes there may be some flattening diffusely but nothing that looks markedly different from previous EKG last month EKG #2 read interpreted by me shows sinus bradycardia rate of 58 normal axis very similar to EKG #1. ____________________________________________  RADIOLOGY  ED MD interpretation: Chest x-ray read by radiology reviewed by me appears stable.  Official radiology report(s): Dg Chest 2 View  Result Date: 01/16/2019 CLINICAL DATA:  Right and left chest pain. EXAM: CHEST - 2 VIEW COMPARISON:  Radiograph 12/04/2018 FINDINGS: Normal heart size and mediastinal contours. Coronary stents visualized. No pulmonary edema. Multiple areas of lung scarring most prominent in the right upper lobe, unchanged from prior exam. No acute airspace disease. No pleural fluid or pneumothorax. Remote left clavicle fracture. No acute osseous abnormalities are seen. IMPRESSION: 1. No acute abnormality. 2. Stable appearance of the chest with multiple areas of lung scarring. Electronically Signed   By: Keith Rake M.D.   On: 01/16/2019 14:52    ____________________________________________   PROCEDURES  Procedure(s) performed (including Critical Care):  Procedures   ____________________________________________   INITIAL IMPRESSION / ASSESSMENT AND PLAN / ED COURSE  Patient's 3 troponins remain stable.  He is pain-free.  EKGs do not show anything that looks like an acute change.  I will let him go as he wishes have him follow-up with Dr. Clayborn Bigness on Monday and return if he has any further problems.              ____________________________________________   FINAL CLINICAL IMPRESSION(S) / ED DIAGNOSES  Final diagnoses:  Nonspecific chest pain     ED Discharge Orders    None        Note:  This document was prepared using Dragon voice recognition software and may include unintentional dictation errors.    Nena Polio, MD 01/16/19 2134

## 2019-01-16 NOTE — Discharge Instructions (Signed)
Please return if you have more of this pain.  Please call Dr. Etta Quill office first thing Monday morning let them know what happened they will check on you again.  Like we talked about I would use EMS to get here especially at night.  If something happens they can start working on you immediately.

## 2019-01-16 NOTE — ED Triage Notes (Addendum)
Reports chest pain to right and left, sharp with breathing. Worse with movement. Pt with cardiac hx, had 2 stents cleaned out and 3 placed X 2 weeks ago at [facility]. Pt states pain began while he was at rest. "hurts to breathe". Alert and oriented X 4. Color WNL.  Pt took 1 nitro tab PTA with no relief.

## 2019-01-16 NOTE — ED Notes (Signed)
MD at bedside. 

## 2019-02-13 ENCOUNTER — Ambulatory Visit (INDEPENDENT_AMBULATORY_CARE_PROVIDER_SITE_OTHER): Payer: Medicare Other | Admitting: Family Medicine

## 2019-02-13 ENCOUNTER — Encounter: Payer: Self-pay | Admitting: Family Medicine

## 2019-02-13 ENCOUNTER — Other Ambulatory Visit: Payer: Self-pay

## 2019-02-13 VITALS — BP 110/68 | HR 62 | Temp 96.9°F | Resp 16 | Ht 68.0 in | Wt 185.4 lb

## 2019-02-13 DIAGNOSIS — M47816 Spondylosis without myelopathy or radiculopathy, lumbar region: Secondary | ICD-10-CM | POA: Diagnosis not present

## 2019-02-13 DIAGNOSIS — G8929 Other chronic pain: Secondary | ICD-10-CM

## 2019-02-13 DIAGNOSIS — M1990 Unspecified osteoarthritis, unspecified site: Secondary | ICD-10-CM

## 2019-02-13 DIAGNOSIS — M545 Low back pain, unspecified: Secondary | ICD-10-CM

## 2019-02-13 NOTE — Progress Notes (Signed)
Patient: Luke Rojas Male    DOB: 12/15/1945   73 y.o.   MRN: [id number] Visit Date: 02/13/2019  Today's Provider: Vernie Murders, PA   Chief Complaint  Patient presents with  . Back Pain   Subjective:     Back Pain The current episode started yesterday. The problem occurs constantly. The problem has been gradually improving since onset. The pain is present in the lumbar spine. The quality of the pain is described as aching and cramping. The pain does not radiate. The pain is mild. The pain is the same all the time. The symptoms are aggravated by bending, twisting and position. Associated symptoms include dysuria. Pertinent negatives include no abdominal pain, fever or leg pain. He has tried heat and bed rest for the symptoms. The treatment provided mild relief.   Past Medical History:  Diagnosis Date  . Chronic kidney disease    kidney stones  . Coronary artery disease    2 stents   . Diabetes mellitus without complication ([place])   . GERD (gastroesophageal reflux disease)   . Hypercholesteremia   . Hypertension   . Myocardial infarction [facility]) 2013   Past Surgical History:  Procedure Laterality Date  . CARDIAC CATHETERIZATION  2013   2 stents  . COLONOSCOPY  04/27/05  . COLONOSCOPY WITH PROPOFOL N/A 06/08/2015   Procedure: COLONOSCOPY WITH PROPOFOL;  Surgeon: Robert Bellow, MD;  Location: [facility] ENDOSCOPY;  Service: Endoscopy;  Laterality: N/A;  . CYSTOSCOPY WITH STENT PLACEMENT Right 09/16/2016   Procedure: CYSTOSCOPY WITH STENT PLACEMENT;  Surgeon: Royston Cowper, MD;  Location: [facility] ORS;  Service: Urology;  Laterality: Right;  . EXTRACORPOREAL SHOCK WAVE LITHOTRIPSY Left 11/04/2014   has had 2 previous lithotripsies  . EXTRACORPOREAL SHOCK WAVE LITHOTRIPSY Left 03/24/2015   Procedure: EXTRACORPOREAL SHOCK WAVE LITHOTRIPSY (ESWL);  Surgeon: Royston Cowper, MD;  Location: [facility] ORS;  Service: Urology;  Laterality: Left;  . EXTRACORPOREAL SHOCK WAVE LITHOTRIPSY  Right 10/04/2016   Procedure: EXTRACORPOREAL SHOCK WAVE LITHOTRIPSY (ESWL);  Surgeon: Royston Cowper, MD;  Location: [facility] ORS;  Service: Urology;  Laterality: Right;  . LEFT HEART CATH AND CORONARY ANGIOGRAPHY N/A 12/05/2018   Procedure: LEFT HEART CATH AND CORONARY ANGIOGRAPHY with possible pci and stent;  Surgeon: Yolonda Kida, MD;  Location: [facility] LAB;  Service: Cardiovascular;  Laterality: N/A;   Family History  Problem Relation Age of Onset  . Pulmonary embolism Mother   . Transient ischemic attack Mother   . Diabetes Mother   . Pancreatic cancer Father   . Hypertension Father   . Diabetes Father   . Cirrhosis Brother    Allergies  Allergen Reactions  . Codeine Nausea Only and Nausea And Vomiting    Current Outpatient Medications:  .  finasteride (PROSCAR) 5 MG tablet, Take by mouth., Disp: , Rfl:  .  amLODipine (NORVASC) 5 MG tablet, Take 5 mg by mouth daily. , Disp: , Rfl:  .  aspirin EC 81 MG tablet, Take 81 mg by mouth daily., Disp: , Rfl:  .  atorvastatin (LIPITOR) 40 MG tablet, Take 1 tablet by mouth at bedtime. , Disp: , Rfl:  .  clopidogrel (PLAVIX) 75 MG tablet, Take 1 tablet (75 mg total) by mouth daily., Disp: 30 tablet, Rfl: 11 .  co-enzyme Q-10 30 MG capsule, Take 30 mg by mouth daily. , Disp: , Rfl:  .  diclofenac sodium (VOLTAREN) 1 % GEL, Apply 2 g topically 4 (four)  times daily., Disp: 100 g, Rfl: 3 .  ibuprofen (ADVIL) 600 MG tablet, , Disp: , Rfl:  .  isosorbide mononitrate (IMDUR) 60 MG 24 hr tablet, Take 60 mg by mouth daily., Disp: , Rfl:  .  Krill Oil 1000 MG CAPS, Take 1 mg by mouth daily., Disp: , Rfl:  .  metoprolol tartrate (LOPRESSOR) 25 MG tablet, Take 1 tablet (25 mg total) by mouth 2 (two) times daily., Disp: 60 tablet, Rfl: 2 .  MULTIPLE VITAMIN PO, Take 1 tablet by mouth daily., Disp: , Rfl:  .  nitroGLYCERIN (NITROSTAT) 0.4 MG SL tablet, Place 1 tablet (0.4 mg total) under the tongue every 5 (five) minutes as needed for chest  pain., Disp: 30 tablet, Rfl: 1 .  Omega-3 Fatty Acids (FISH OIL) 1200 MG CAPS, Take 1 capsule by mouth daily. , Disp: , Rfl:  .  omeprazole (PRILOSEC) 20 MG capsule, Take 20 mg by mouth daily. , Disp: , Rfl:  .  pantoprazole (PROTONIX) 40 MG tablet, Take 40 mg by mouth daily., Disp: , Rfl:  .  ramipril (ALTACE) 10 MG capsule, Take 1 capsule (10 mg total) by mouth daily. (Patient taking differently: Take 20 mg by mouth daily. ), Disp: 30 capsule, Rfl: 2 .  sulfamethoxazole-trimethoprim (BACTRIM DS) 800-160 MG tablet, Take 1 tablet by mouth 2 (two) times daily., Disp: , Rfl:  .  tamsulosin (FLOMAX) 0.4 MG CAPS capsule, Take 0.4 mg by mouth daily., Disp: , Rfl:   Review of Systems  Constitutional: Negative.  Negative for fever.  Eyes: Negative.   Respiratory: Negative.   Cardiovascular: Negative.   Gastrointestinal: Negative.  Negative for abdominal pain.  Endocrine: Negative.   Genitourinary: Positive for dysuria.  Musculoskeletal: Positive for arthralgias, back pain and myalgias.  Skin: Negative.   Allergic/Immunologic: Negative.   Neurological: Negative.   Hematological: Negative.   Psychiatric/Behavioral: Negative.     Social History   Tobacco Use  . Smoking status: Former Smoker    Packs/day: 1.00    Years: 30.00    Pack years: 30.00    Types: Cigarettes    Quit date: 03/05/1984    Years since quitting: 34.9  . Smokeless tobacco: Current User    Types: Chew  Substance Use Topics  . Alcohol use: No    Alcohol/week: 0.0 standard drinks      Objective:   BP 110/68   Pulse 62   Temp (!) 96.9 F (36.1 C) (Temporal)   Resp 16   Ht 5\' 8"  (1.727 m)   Wt 185 lb 6.4 oz (84.1 kg)   SpO2 99%   BMI 28.19 kg/m  Vitals:   02/13/19 1018  BP: 110/68  Pulse: 62  Resp: 16  Temp: (!) 96.9 F (36.1 C)  TempSrc: Temporal  SpO2: 99%  Weight: 185 lb 6.4 oz (84.1 kg)  Height: 5\' 8"  (1.727 m)  Body mass index is 28.19 kg/m.  Physical Exam Constitutional:      General: He is  not in acute distress.    Appearance: He is well-developed.  HENT:     Head: Normocephalic and atraumatic.     Right Ear: Hearing normal.     Left Ear: Hearing normal.     Nose: Nose normal.  Eyes:     General: Lids are normal. No scleral icterus.       Right eye: No discharge.        Left eye: No discharge.     Conjunctiva/sclera: Conjunctivae normal.  Cardiovascular:  Rate and Rhythm: Normal rate.     Heart sounds: Normal heart sounds.  Pulmonary:     Effort: Pulmonary effort is normal. No respiratory distress.  Musculoskeletal:        General: Tenderness present. Normal range of motion.     Comments: Tenderness in the left lower lumbar muscles to palpate and with certain movements. Able to stand and walk without much discomfort today. No numbness or radiation of pain. SLR's 90 degrees with minimal discomfort.  Skin:    Findings: No lesion or rash.  Neurological:     Mental Status: He is alert and oriented to person, place, and time.  Psychiatric:        Speech: Speech normal.        Behavior: Behavior normal.        Thought Content: Thought content normal.       Assessment & Plan    1. Chronic left-sided low back pain without sciatica Recurrence of low back sore muscles. Had urology evaluation yesterday with no signs of kidney stone recurrence on x-rays. State Dr. Rogers Blocker identified some bone spurs (history of degenerative changes on previous films, also). Has been using DMSO Gel alternated with moist heat applications at home with good relief. No radiation of pain or numbness. No muscle weakness. May continue this regimen and recheck if no better in a week. Should restrict lifting and riding his lawnmower until under control. Thought back pain may have been a flare of his kidney stones and had an urology evaluation yesterday. Was started on Bactrim-DS and Flomax for suspected UTI with BPH. Continue follow up with Dr. Yves Dill.  2. Lumbar facet arthropathy Documented on L-S  spine and MRI in 2019 with multifactorial spinal stenosis L3-L4 and severe facet degeneration.   3. Arthritis History of arthritis of spine and hands. Finds the DMSO Liniment helps with inflammation and stiffness over the past week.     Vernie Murders, PA  [facility]

## 2019-04-01 NOTE — Progress Notes (Addendum)
Subjective:   Luke Rojas is a 74 y.o. male who presents for Medicare Annual/Subsequent preventive examination.    This visit is being conducted through telemedicine due to the COVID-19 pandemic. This patient has given me verbal consent via doximity to conduct this visit, patient states they are participating from their home address. Some vital signs may be absent or patient reported.    Patient identification: identified by name, DOB, and current address  Review of Systems:  N/A  Cardiac Risk Factors include: advanced age (>93men, >53 women);diabetes mellitus;dyslipidemia;hypertension;male gender     Objective:    Vitals: There were no vitals taken for this visit.  There is no height or weight on file to calculate BMI. Unable to obtain vitals due to visit being conducted via telephonically.   Advanced Directives 04/02/2019 01/16/2019 12/05/2018 12/04/2018 11/24/2018 11/22/2017 07/24/2017  Does Patient Have a Medical Advance Directive? No No No No No No No  Would patient like information on creating a medical advance directive? No - Patient declined - No - Patient declined No - Patient declined No - Patient declined No - Patient declined No - Patient declined    Tobacco Social History   Tobacco Use  Smoking Status Former Smoker   Packs/day: 1.00   Years: 30.00   Pack years: 30.00   Types: Cigarettes   Quit date: 03/05/1984   Years since quitting: 35.0  Smokeless Tobacco Current User   Types: Chew     Ready to quit: Not Answered Counseling given: Not Answered   Clinical Intake:  Pre-visit preparation completed: Yes  Pain : No/denies pain Pain Score: 0-No pain     Nutritional Risks: None Diabetes: Yes  How often do you need to have someone help you when you read instructions, pamphlets, or other written materials from your doctor or pharmacy?: 1 - Never   Diabetes:  Is the patient diabetic?  Yes  If diabetic, was a CBG obtained today?  No  Did the patient  bring in their glucometer from home?  No  How often do you monitor your CBG's? Once a day before breakfast.   Financial Strains and Diabetes Management:  Are you having any financial strains with the device, your supplies or your medication? No .  Does the patient want to be seen by Chronic Care Management for management of their diabetes?  No  Would the patient like to be referred to a Nutritionist or for Diabetic Management?  No   Diabetic Exams:  Diabetic Eye Exam: Completed 08/07/17. Overdue for diabetic eye exam. Pt has been advised about the importance in completing this exam. Recommend to follow up this year.  Diabetic Foot Exam: Currently due. Pt has been advised about the importance in completing this exam. Note made to follow up on this at next in office visit.   Interpreter Needed?: No  Information entered by :: [facility], LPN  Past Medical History:  Diagnosis Date   Chronic kidney disease    kidney stones   Coronary artery disease    2 stents    Diabetes mellitus without complication ([place])    GERD (gastroesophageal reflux disease)    Hypercholesteremia    Hypertension    Myocardial infarction [facility]) 2013   Past Surgical History:  Procedure Laterality Date   CARDIAC CATHETERIZATION  2013   2 stents   COLONOSCOPY  04/27/05   COLONOSCOPY WITH PROPOFOL N/A 06/08/2015   Procedure: COLONOSCOPY WITH PROPOFOL;  Surgeon: Robert Bellow, MD;  Location: [facility] ENDOSCOPY;  Service: Endoscopy;  Laterality: N/A;   CYSTOSCOPY WITH STENT PLACEMENT Right 09/16/2016   Procedure: CYSTOSCOPY WITH STENT PLACEMENT;  Surgeon: Royston Cowper, MD;  Location: [facility] ORS;  Service: Urology;  Laterality: Right;   EXTRACORPOREAL SHOCK WAVE LITHOTRIPSY Left 11/04/2014   has had 2 previous lithotripsies   EXTRACORPOREAL SHOCK WAVE LITHOTRIPSY Left 03/24/2015   Procedure: EXTRACORPOREAL SHOCK WAVE LITHOTRIPSY (ESWL);  Surgeon: Royston Cowper, MD;  Location: [facility] ORS;  Service: Urology;  Laterality:  Left;   EXTRACORPOREAL SHOCK WAVE LITHOTRIPSY Right 10/04/2016   Procedure: EXTRACORPOREAL SHOCK WAVE LITHOTRIPSY (ESWL);  Surgeon: Royston Cowper, MD;  Location: [facility] ORS;  Service: Urology;  Laterality: Right;   LEFT HEART CATH AND CORONARY ANGIOGRAPHY N/A 12/05/2018   Procedure: LEFT HEART CATH AND CORONARY ANGIOGRAPHY with possible pci and stent;  Surgeon: Yolonda Kida, MD;  Location: [facility];  Service: Cardiovascular;  Laterality: N/A;   Family History  Problem Relation Age of Onset   Pulmonary embolism Mother    Transient ischemic attack Mother    Diabetes Mother    Pancreatic cancer Father    Hypertension Father    Diabetes Father    Cirrhosis Brother    Social History   Socioeconomic History   Marital status: Married    Spouse name: Not on file   Number of children: 2   Years of education: Not on file   Highest education level: 8th grade  Occupational History   Occupation: retired  Tobacco Use   Smoking status: Former Smoker    Packs/day: 1.00    Years: 30.00    Pack years: 30.00    Types: Cigarettes    Quit date: 03/05/1984    Years since quitting: 35.0   Smokeless tobacco: Current User    Types: Chew  Substance and Sexual Activity   Alcohol use: No    Alcohol/week: 0.0 standard drinks   Drug use: No   Sexual activity: Not on file  Other Topics Concern   Not on file  Social History Narrative   Not on file   Social Determinants of Health   Financial Resource Strain: Low Risk    Difficulty of Paying Living Expenses: Not hard at all  Food Insecurity: No Food Insecurity   Worried About Charity fundraiser in the Last Year: Never true   [place] in the Last Year: Never true  Transportation Needs: No Transportation Needs   Lack of Transportation (Medical): No   Lack of Transportation (Non-Medical): No  Physical Activity: Insufficiently Active   Days of Exercise per Week: 2 days   Minutes of Exercise per Session: 30 min  Stress: No  Stress Concern Present   Feeling of Stress : Not at all  Social Connections: Slightly Isolated   Frequency of Communication with Friends and Family: More than three times a week   Frequency of Social Gatherings with Friends and Family: More than three times a week   Attends Religious Services: More than 4 times per year   Active Member of [employer] or Organizations: No   Attends Archivist Meetings: Never   Marital Status: Married    Outpatient Encounter Medications as of 04/02/2019  Medication Sig   amLODipine (NORVASC) 5 MG tablet Take 5 mg by mouth daily.    aspirin EC 81 MG tablet Take 81 mg by mouth daily.   atorvastatin (LIPITOR) 40 MG tablet Take 1 tablet by mouth at bedtime.    clopidogrel (PLAVIX)  75 MG tablet Take 1 tablet (75 mg total) by mouth daily.   co-enzyme Q-10 30 MG capsule Take 30 mg by mouth daily.    diclofenac sodium (VOLTAREN) 1 % GEL Apply 2 g topically 4 (four) times daily.   finasteride (PROSCAR) 5 MG tablet Take 5 mg by mouth daily.    ibuprofen (ADVIL) 600 MG tablet Take 600 mg by mouth every 6 (six) hours as needed.    Krill Oil 1000 MG CAPS Take 1 mg by mouth daily.   metoprolol tartrate (LOPRESSOR) 25 MG tablet Take 1 tablet (25 mg total) by mouth 2 (two) times daily.   MULTIPLE VITAMIN PO Take 1 tablet by mouth daily.   nitroGLYCERIN (NITROSTAT) 0.4 MG SL tablet Place 1 tablet (0.4 mg total) under the tongue every 5 (five) minutes as needed for chest pain.   omeprazole (PRILOSEC) 20 MG capsule Take 20 mg by mouth daily.    pantoprazole (PROTONIX) 40 MG tablet Take 40 mg by mouth daily.   ramipril (ALTACE) 10 MG capsule Take 1 capsule (10 mg total) by mouth daily.   tamsulosin (FLOMAX) 0.4 MG CAPS capsule Take 0.4 mg by mouth daily.   isosorbide mononitrate (IMDUR) 60 MG 24 hr tablet Take 60 mg by mouth daily.   Omega-3 Fatty Acids (FISH OIL) 1200 MG CAPS Take 1 capsule by mouth daily.    sulfamethoxazole-trimethoprim (BACTRIM DS) 800-160 MG tablet  Take 1 tablet by mouth 2 (two) times daily.   No facility-administered encounter medications on file as of 04/02/2019.    Activities of Daily Living In your present state of health, do you have any difficulty performing the following activities: 04/02/2019 12/05/2018  Hearing? N -  Vision? N -  Difficulty concentrating or making decisions? N -  Walking or climbing stairs? N -  Dressing or bathing? N -  Doing errands, shopping? N N  Preparing Food and eating ? N -  Using the Toilet? N -  In the past six months, have you accidently leaked urine? N -  Do you have problems with loss of bowel control? N -  Managing your Medications? N -  Managing your Finances? N -  Housekeeping or managing your Housekeeping? N -  Some recent data might be hidden    Patient Care Team: Chrismon, Vickki Muff, PA as PCP - General (Physician Assistant) Thelma Comp, Wiseman as Consulting Physician (Optometry) Royston Cowper, MD as Consulting Physician (Urology) Yolonda Kida, MD as Consulting Physician (Cardiology)   Assessment:   This is a routine wellness examination for Gwyndolyn Saxon.  Exercise Activities and Dietary recommendations Current Exercise Habits: Home exercise routine, Type of exercise: walking, Time (Minutes): 30, Frequency (Times/Week): 2, Weekly Exercise (Minutes/Week): 60, Intensity: Mild, Exercise limited by: None identified  Goals      Exercise 3x per week (30 min per time)     Recommend to exercise for 3 days a week for at least 30 minutes at a time.      Increase water intake     Continue drinking 6-8 glasses of water a day. Pt declined diet or exercise change.        Fall Risk Fall Risk  04/02/2019 02/13/2019 01/09/2018 12/19/2017 11/22/2017  Falls in the past year? 0 0 0 No No  Comment - - - - -  Number falls in past yr: 0 0 - - -  Injury with Fall? 0 0 - - -  Comment - - - - -  Follow up -  Falls evaluation completed - - -   FALL RISK PREVENTION PERTAINING TO THE  HOME:  Any stairs in or around the home? Yes  If so, are there any without handrails? No   Home free of loose throw rugs in walkways, pet beds, electrical cords, etc? Yes  Adequate lighting in your home to reduce risk of falls? Yes   ASSISTIVE DEVICES UTILIZED TO PREVENT FALLS:  Life alert? No  Use of a cane, walker or w/c? No  Grab bars in the bathroom? Yes  Shower chair or bench in shower? Yes  Elevated toilet seat or a handicapped toilet? Yes    TIMED UP AND GO:  Was the test performed? No .    Depression Screen PHQ 2/9 Scores 04/02/2019 04/02/2019 02/13/2019 01/09/2018  PHQ - 2 Score 0 0 4 0  PHQ- 9 Score - - 6 -    Cognitive Function: Declined today.        Immunization History  Administered Date(s) Administered   Fluad Quad(high Dose 65+) 12/31/2018   Influenza Split 03/10/2010, 03/11/2012   Influenza, High Dose Seasonal PF 01/07/2014, 10/31/2015, 10/30/2016, 11/22/2017   Influenza,inj,Quad PF,6+ Mos 12/09/2012   Pneumococcal Conjugate-13 01/07/2014   Pneumococcal Polysaccharide-23 03/31/2015   Tdap 09/23/2009   Zoster 04/02/2011    Qualifies for Shingles Vaccine? Yes  Zostavax completed 04/02/11. Due for Shingrix. Pt has been advised to call insurance company to determine out of pocket expense. Advised may also receive vaccine at local pharmacy or Health Dept. Verbalized acceptance and understanding.  Tdap: Up to date  Flu Vaccine: Up to date  Pneumococcal Vaccine: Completed series  Screening Tests Health Maintenance  Topic Date Due   OPHTHALMOLOGY EXAM  08/08/2018   HEMOGLOBIN A1C  06/01/2019   TETANUS/TDAP  09/24/2019   FOOT EXAM  11/27/2019   COLONOSCOPY  06/07/2025   INFLUENZA VACCINE  Completed   Hepatitis C Screening  Completed   PNA vac Low Risk Adult  Completed   Cancer Screenings:  Colorectal Screening: Completed 06/08/15. Repeat every 10 years.   Lung Cancer Screening: (Low Dose CT Chest recommended if Age 52-80 years, 30 pack-year  currently smoking OR have quit w/in 15years.) does not qualify.   Additional Screening:  Hepatitis C Screening: Up to date  Dental Screening: Recommended annual dental exams for proper oral hygiene  Community Resource Referral:  CRR required this visit?  No        Plan:  I have personally reviewed and addressed the Medicare Annual Wellness questionnaire and have noted the following in the patient's chart:  A. Medical and social history B. Use of alcohol, tobacco or illicit drugs  C. Current medications and supplements D. Functional ability and status E.  Nutritional status F.  Physical activity G. Advance directives H. List of other physicians I.  Hospitalizations, surgeries, and ER visits in previous 12 months J.  [place] such as hearing and vision if needed, cognitive and depression L. Referrals and appointments   In addition, I have reviewed and discussed with patient certain preventive protocols, quality metrics, and best practice recommendations. A written personalized care plan for preventive services as well as general preventive health recommendations were provided to patient.   Glendora Score, [place]  08/25/6331 Nurse Health Advisor  Nurse Notes: Pt to schedule an eye exam this year.   Reviewed Nurse Health Advisor's note and plan. Agree with documentation and recommendations.

## 2019-04-02 ENCOUNTER — Other Ambulatory Visit: Payer: Self-pay

## 2019-04-02 ENCOUNTER — Ambulatory Visit (INDEPENDENT_AMBULATORY_CARE_PROVIDER_SITE_OTHER): Payer: Medicare Other

## 2019-04-02 DIAGNOSIS — Z0000 Encounter for general adult medical examination without abnormal findings: Secondary | ICD-10-CM | POA: Diagnosis not present

## 2019-04-02 NOTE — Patient Instructions (Signed)
Luke Rojas , Thank you for taking time to come for your Medicare Wellness Visit. I appreciate your ongoing commitment to your health goals. Please review the following plan we discussed and let me know if I can assist you in the future.   Screening recommendations/referrals: Colonoscopy: Up to date, due 06/2025 Recommended yearly ophthalmology/optometry visit for glaucoma screening and checkup Recommended yearly dental visit for hygiene and checkup  Vaccinations: Influenza vaccine: Up to date Pneumococcal vaccine: Completed series Tdap vaccine: Up to date, due 09/2019 Shingles vaccine: Pt declines today.     Advanced directives: Advance directive discussed with you today. Even though you declined this today please call our office should you change your mind and we can give you the proper paperwork for you to fill out.  Conditions/risks identified: Recommend to exercise for 3 days a week for at least 30 minutes at a time.   Next appointment: 04/07/20 @ 9:00 AM for an AWV. Declined scheduling a follow up with PCP at this time.  Preventive Care 73 Years and Older, Male Preventive care refers to lifestyle choices and visits with your health care provider that can promote health and wellness. What does preventive care include?  A yearly physical exam. This is also called an annual well check.  Dental exams once or twice a year.  Routine eye exams. Ask your health care provider how often you should have your eyes checked.  Personal lifestyle choices, including:  Daily care of your teeth and gums.  Regular physical activity.  Eating a healthy diet.  Avoiding tobacco and drug use.  Limiting alcohol use.  Practicing safe sex.  Taking low doses of aspirin every day.  Taking vitamin and mineral supplements as recommended by your health care provider. What happens during an annual well check? The services and screenings done by your health care provider during your annual well check  will depend on your age, overall health, lifestyle risk factors, and family history of disease. Counseling  Your health care provider may ask you questions about your:  Alcohol use.  Tobacco use.  Drug use.  Emotional well-being.  Home and relationship well-being.  Sexual activity.  Eating habits.  History of falls.  Memory and ability to understand (cognition).  Work and work Statistician. Screening  You may have the following tests or measurements:  Height, weight, and BMI.  Blood pressure.  Lipid and cholesterol levels. These may be checked every 5 years, or more frequently if you are over 40 years old.  Skin check.  Lung cancer screening. You may have this screening every year starting at age 92 if you have a 30-pack-year history of smoking and currently smoke or have quit within the past 15 years.  Fecal occult blood test (FOBT) of the stool. You may have this test every year starting at age 59.  Flexible sigmoidoscopy or colonoscopy. You may have a sigmoidoscopy every 5 years or a colonoscopy every 10 years starting at age 34.  Prostate cancer screening. Recommendations will vary depending on your family history and other risks.  Hepatitis C blood test.  Hepatitis B blood test.  Sexually transmitted disease (STD) testing.  Diabetes screening. This is done by checking your blood sugar (glucose) after you have not eaten for a while (fasting). You may have this done every 1-3 years.  Abdominal aortic aneurysm (AAA) screening. You may need this if you are a current or former smoker.  Osteoporosis. You may be screened starting at age 16 if you are  at high risk. Talk with your health care provider about your test results, treatment options, and if necessary, the need for more tests. Vaccines  Your health care provider may recommend certain vaccines, such as:  Influenza vaccine. This is recommended every year.  Tetanus, diphtheria, and acellular pertussis  (Tdap, Td) vaccine. You may need a Td booster every 10 years.  Zoster vaccine. You may need this after age 104.  Pneumococcal 13-valent conjugate (PCV13) vaccine. One dose is recommended after age 34.  Pneumococcal polysaccharide (PPSV23) vaccine. One dose is recommended after age 41. Talk to your health care provider about which screenings and vaccines you need and how often you need them. This information is not intended to replace advice given to you by your health care provider. Make sure you discuss any questions you have with your health care provider. Document Released: 03/18/2015 Document Revised: 11/09/2015 Document Reviewed: 12/21/2014 Elsevier Interactive Patient Education  2017 [place] Prevention in the Home Falls can cause injuries. They can happen to people of all ages. There are many things you can do to make your home safe and to help prevent falls. What can I do on the outside of my home?  Regularly fix the edges of walkways and driveways and fix any cracks.  Remove anything that might make you trip as you walk through a door, such as a raised step or threshold.  Trim any bushes or trees on the path to your home.  Use bright outdoor lighting.  Clear any walking paths of anything that might make someone trip, such as rocks or tools.  Regularly check to see if handrails are loose or broken. Make sure that both sides of any steps have handrails.  Any raised decks and porches should have guardrails on the edges.  Have any leaves, snow, or ice cleared regularly.  Use sand or salt on walking paths during winter.  Clean up any spills in your garage right away. This includes oil or grease spills. What can I do in the bathroom?  Use night lights.  Install grab bars by the toilet and in the tub and shower. Do not use towel bars as grab bars.  Use non-skid mats or decals in the tub or shower.  If you need to sit down in the shower, use a plastic, non-slip  stool.  Keep the floor dry. Clean up any water that spills on the floor as soon as it happens.  Remove soap buildup in the tub or shower regularly.  Attach bath mats securely with double-sided non-slip rug tape.  Do not have throw rugs and other things on the floor that can make you trip. What can I do in the bedroom?  Use night lights.  Make sure that you have a light by your bed that is easy to reach.  Do not use any sheets or blankets that are too big for your bed. They should not hang down onto the floor.  Have a firm chair that has side arms. You can use this for support while you get dressed.  Do not have throw rugs and other things on the floor that can make you trip. What can I do in the kitchen?  Clean up any spills right away.  Avoid walking on wet floors.  Keep items that you use a lot in easy-to-reach places.  If you need to reach something above you, use a strong step stool that has a grab bar.  Keep electrical cords out of  the way.  Do not use floor polish or wax that makes floors slippery. If you must use wax, use non-skid floor wax.  Do not have throw rugs and other things on the floor that can make you trip. What can I do with my stairs?  Do not leave any items on the stairs.  Make sure that there are handrails on both sides of the stairs and use them. Fix handrails that are broken or loose. Make sure that handrails are as long as the stairways.  Check any carpeting to make sure that it is firmly attached to the stairs. Fix any carpet that is loose or worn.  Avoid having throw rugs at the top or bottom of the stairs. If you do have throw rugs, attach them to the floor with carpet tape.  Make sure that you have a light switch at the top of the stairs and the bottom of the stairs. If you do not have them, ask someone to add them for you. What else can I do to help prevent falls?  Wear shoes that:  Do not have high heels.  Have rubber bottoms.  Are  comfortable and fit you well.  Are closed at the toe. Do not wear sandals.  If you use a stepladder:  Make sure that it is fully opened. Do not climb a closed stepladder.  Make sure that both sides of the stepladder are locked into place.  Ask someone to hold it for you, if possible.  Clearly mark and make sure that you can see:  Any grab bars or handrails.  First and last steps.  Where the edge of each step is.  Use tools that help you move around (mobility aids) if they are needed. These include:  Canes.  Walkers.  Scooters.  Crutches.  Turn on the lights when you go into a dark area. Replace any light bulbs as soon as they burn out.  Set up your furniture so you have a clear path. Avoid moving your furniture around.  If any of your floors are uneven, fix them.  If there are any pets around you, be aware of where they are.  Review your medicines with your doctor. Some medicines can make you feel dizzy. This can increase your chance of falling. Ask your doctor what other things that you can do to help prevent falls. This information is not intended to replace advice given to you by your health care provider. Make sure you discuss any questions you have with your health care provider. Document Released: 12/16/2008 Document Revised: 07/28/2015 Document Reviewed: 03/26/2014 Elsevier Interactive Patient Education  2017 [employer].

## 2019-04-06 ENCOUNTER — Encounter: Payer: Self-pay | Admitting: Physician Assistant

## 2019-04-06 ENCOUNTER — Ambulatory Visit: Payer: Medicare Other | Attending: Internal Medicine

## 2019-04-06 ENCOUNTER — Ambulatory Visit (INDEPENDENT_AMBULATORY_CARE_PROVIDER_SITE_OTHER): Payer: Medicare Other | Admitting: Physician Assistant

## 2019-04-06 VITALS — Temp 98.2°F

## 2019-04-06 DIAGNOSIS — Z20822 Contact with and (suspected) exposure to covid-19: Secondary | ICD-10-CM

## 2019-04-06 DIAGNOSIS — R0989 Other specified symptoms and signs involving the circulatory and respiratory systems: Secondary | ICD-10-CM

## 2019-04-06 DIAGNOSIS — R059 Cough, unspecified: Secondary | ICD-10-CM

## 2019-04-06 DIAGNOSIS — R05 Cough: Secondary | ICD-10-CM

## 2019-04-06 MED ORDER — BENZONATATE 200 MG PO CAPS: 200.0000 mg | ORAL_CAPSULE | Freq: Three times a day (TID) | ORAL | 0 refills | Status: DC | PRN

## 2019-04-06 MED ORDER — PREDNISONE 10 MG (21) PO TBPK: ORAL_TABLET | ORAL | 0 refills | Status: DC

## 2019-04-06 MED ORDER — AZITHROMYCIN 250 MG PO TABS: ORAL_TABLET | ORAL | 0 refills | Status: DC

## 2019-04-06 NOTE — Progress Notes (Signed)
Patient: Luke Rojas Male    DOB: Dec 18, 1945   74 y.o.   MRN: [id number] Visit Date: 04/06/2019  Today's Provider: Mar Daring, PA-C   Chief Complaint  Patient presents with  . Cough   Subjective:    Virtual Visit via Telephone Note  I connected with Luke Rojas on 04/06/19 at 11:20 AM EST by telephone and verified that I am speaking with the correct person using two identifiers.  Location: Patient: Home Provider: [facility]   I discussed the limitations, risks, security and privacy concerns of performing an evaluation and management service by telephone and the availability of in person appointments. I also discussed with the patient that there may be a patient responsible charge related to this service. The patient expressed understanding and agreed to proceed.   Cough This is a new problem. The current episode started in the past 7 days (started Friday). The problem has been unchanged. The cough is non-productive. Associated symptoms include chest pain ("chest hurts" took Nitroglycerin), chills and postnasal drip. Pertinent negatives include no ear pain, fever, headaches, sore throat, shortness of breath or wheezing. Nothing aggravates the symptoms. He has tried rest (Tylenol and Mucinex) for the symptoms. The treatment provided no relief.    Allergies  Allergen Reactions  . Codeine Nausea Only and Nausea And Vomiting     Current Outpatient Medications:  .  aspirin EC 81 MG tablet, Take 81 mg by mouth daily., Disp: , Rfl:  .  atorvastatin (LIPITOR) 40 MG tablet, Take 1 tablet by mouth at bedtime. , Disp: , Rfl:  .  clopidogrel (PLAVIX) 75 MG tablet, Take 1 tablet (75 mg total) by mouth daily., Disp: 30 tablet, Rfl: 11 .  co-enzyme Q-10 30 MG capsule, Take 30 mg by mouth daily. , Disp: , Rfl:  .  diclofenac sodium (VOLTAREN) 1 % GEL, Apply 2 g topically 4 (four) times daily., Disp: 100 g, Rfl: 3 .  finasteride (PROSCAR) 5 MG tablet, Take 5 mg by mouth  daily. , Disp: , Rfl:  .  ibuprofen (ADVIL) 600 MG tablet, Take 600 mg by mouth every 6 (six) hours as needed. , Disp: , Rfl:  .  isosorbide mononitrate (IMDUR) 60 MG 24 hr tablet, Take 60 mg by mouth daily., Disp: , Rfl:  .  Krill Oil 1000 MG CAPS, Take 1 mg by mouth daily., Disp: , Rfl:  .  metoprolol tartrate (LOPRESSOR) 25 MG tablet, Take 1 tablet (25 mg total) by mouth 2 (two) times daily., Disp: 60 tablet, Rfl: 2 .  MULTIPLE VITAMIN PO, Take 1 tablet by mouth daily., Disp: , Rfl:  .  nitroGLYCERIN (NITROSTAT) 0.4 MG SL tablet, Place 1 tablet (0.4 mg total) under the tongue every 5 (five) minutes as needed for chest pain., Disp: 30 tablet, Rfl: 1 .  Omega-3 Fatty Acids (FISH OIL) 1200 MG CAPS, Take 1 capsule by mouth daily. , Disp: , Rfl:  .  omeprazole (PRILOSEC) 20 MG capsule, Take 20 mg by mouth daily. , Disp: , Rfl:  .  pantoprazole (PROTONIX) 40 MG tablet, Take 40 mg by mouth daily., Disp: , Rfl:  .  ramipril (ALTACE) 10 MG capsule, Take 1 capsule (10 mg total) by mouth daily., Disp: 30 capsule, Rfl: 2 .  tamsulosin (FLOMAX) 0.4 MG CAPS capsule, Take 0.4 mg by mouth daily., Disp: , Rfl:  .  amLODipine (NORVASC) 5 MG tablet, Take 5 mg by mouth daily. , Disp: , Rfl:  .  sulfamethoxazole-trimethoprim (BACTRIM DS) 800-160 MG tablet, Take 1 tablet by mouth 2 (two) times daily., Disp: , Rfl:   Review of Systems  Constitutional: Positive for chills and fatigue. Negative for appetite change (still has taste and smell) and fever.  HENT: Positive for congestion ("chest congestion"), postnasal drip, sinus pressure and sinus pain. Negative for ear pain and sore throat.   Respiratory: Positive for cough and chest tightness. Negative for shortness of breath and wheezing.   Cardiovascular: Positive for chest pain ("chest hurts" took Nitroglycerin). Negative for palpitations and leg swelling.  Gastrointestinal: Negative for abdominal pain, diarrhea, nausea and vomiting.  Neurological: Negative for  dizziness, light-headedness and headaches.    Social History   Tobacco Use  . Smoking status: Former Smoker    Packs/day: 1.00    Years: 30.00    Pack years: 30.00    Types: Cigarettes    Quit date: 03/05/1984    Years since quitting: 35.1  . Smokeless tobacco: Current User    Types: Chew  Substance Use Topics  . Alcohol use: No    Alcohol/week: 0.0 standard drinks      Objective:   Temp 98.2 F (36.8 C) (Temporal)  Vitals:   04/06/19 0939  Temp: 98.2 F (36.8 C)  TempSrc: Temporal  There is no height or weight on file to calculate BMI.   Physical Exam Vitals reviewed.  Constitutional:      General: He is not in acute distress. Pulmonary:     Effort: No respiratory distress (was able to talk in full sentences but did cough between sentences).  Neurological:     Mental Status: He is alert.      No results found for any visits on 04/06/19.     Assessment & Plan     1. Cough Suspect covid 19 with high risk for pneumonia complications. Will treat with Zpak, prednisone and tessalon perles. Patient was scheduled for Covid 19 testing today at 2pm. Discussed isolation protocols and he reports he has been isolating since Friday night (04/03/19). Strict return precautions or reasons for UC or ER evaluation were verbally discussed. Will await results and he is to call if worsening or not improving.  - azithromycin (ZITHROMAX) 250 MG tablet; Take 2 tablets PO on day one, and one tablet PO daily thereafter until completed.  Dispense: 6 tablet; Refill: 0 - predniSONE (STERAPRED UNI-PAK 21 TAB) 10 MG (21) TBPK tablet; 6 day taper; take as directed on packaged instructions  Dispense: 21 tablet; Refill: 0 - benzonatate (TESSALON) 200 MG capsule; Take 1 capsule (200 mg total) by mouth 3 (three) times daily as needed.  Dispense: 30 capsule; Refill: 0 - Novel Coronavirus, NAA ([facility])  2. Chest congestion See above medical treatment plan. - azithromycin (ZITHROMAX) 250 MG tablet;  Take 2 tablets PO on day one, and one tablet PO daily thereafter until completed.  Dispense: 6 tablet; Refill: 0 - predniSONE (STERAPRED UNI-PAK 21 TAB) 10 MG (21) TBPK tablet; 6 day taper; take as directed on packaged instructions  Dispense: 21 tablet; Refill: 0 - benzonatate (TESSALON) 200 MG capsule; Take 1 capsule (200 mg total) by mouth 3 (three) times daily as needed.  Dispense: 30 capsule; Refill: 0 - Novel Coronavirus, NAA ([facility])  3. Suspected COVID-19 virus infection See above medical treatment plan. - azithromycin (ZITHROMAX) 250 MG tablet; Take 2 tablets PO on day one, and one tablet PO daily thereafter until completed.  Dispense: 6 tablet; Refill: 0 - predniSONE (STERAPRED UNI-PAK 21 TAB) 10  MG (21) TBPK tablet; 6 day taper; take as directed on packaged instructions  Dispense: 21 tablet; Refill: 0 - benzonatate (TESSALON) 200 MG capsule; Take 1 capsule (200 mg total) by mouth 3 (three) times daily as needed.  Dispense: 30 capsule; Refill: 0 - Novel Coronavirus, NAA ([facility])   I discussed the assessment and treatment plan with the patient. The patient was provided an opportunity to ask questions and all were answered. The patient agreed with the plan and demonstrated an understanding of the instructions.   The patient was advised to call back or seek an in-person evaluation if the symptoms worsen or if the condition fails to improve as anticipated.  I provided 12 minutes of non-face-to-face time during this encounter.    Mar Daring, PA-C  [facility]

## 2019-04-07 ENCOUNTER — Other Ambulatory Visit: Payer: Self-pay | Admitting: Physician Assistant

## 2019-04-07 LAB — NOVEL CORONAVIRUS, NAA: SARS-CoV-2, NAA: DETECTED — AB

## 2019-04-07 NOTE — Progress Notes (Signed)
  I connected by phone with Luke Rojas on 04/07/2019 at 8:16 PM to discuss the potential use of an new treatment for mild to moderate COVID-19 viral infection in non-hospitalized patients.  This patient is a 75 y.o. male that meets the FDA criteria for Emergency Use Authorization of bamlanivimab or casirivimab\imdevimab.  Has a (+) direct SARS-CoV-2 viral test result  Has mild or moderate COVID-19   Is ? 75 years of age and weighs ? 40 kg  Is NOT hospitalized due to COVID-19  Is NOT requiring oxygen therapy or requiring an increase in baseline oxygen flow rate due to COVID-19  Is within 10 days of symptom onset  Has at least one of the high risk factor(s) for progression to severe COVID-19 and/or hospitalization as defined in EUA.  Specific high risk criteria : >/= 75 yo   I have spoken and communicated the following to the patient or parent/caregiver:  1. FDA has authorized the emergency use of bamlanivimab and casirivimab\imdevimab for the treatment of mild to moderate COVID-19 in adults and pediatric patients with positive results of direct SARS-CoV-2 viral testing who are 45 years of age and older weighing at least 40 kg, and who are at high risk for progressing to severe COVID-19 and/or hospitalization.  2. The significant known and potential risks and benefits of bamlanivimab and casirivimab\imdevimab, and the extent to which such potential risks and benefits are unknown.  3. Information on available alternative treatments and the risks and benefits of those alternatives, including clinical trials.  4. Patients treated with bamlanivimab and casirivimab\imdevimab should continue to self-isolate and use infection control measures (e.g., wear mask, isolate, social distance, avoid sharing personal items, clean and disinfect "high touch" surfaces, and frequent handwashing) according to CDC guidelines.   5. The patient or parent/caregiver has the option to accept or refuse  bamlanivimab or casirivimab\imdevimab .  After reviewing this information with the patient, The patient agreed to proceed with receiving the bamlanimivab infusion and will be provided a copy of the Fact sheet prior to receiving the infusion.   Pt set up for 04/08/19 @ 2:30pm. Sx onset 1/29. Directions given.   Angelena Form PA-C 04/07/2019 8:16 PM

## 2019-04-08 ENCOUNTER — Telehealth: Payer: Self-pay | Admitting: Family Medicine

## 2019-04-08 ENCOUNTER — Ambulatory Visit (HOSPITAL_COMMUNITY)
Admission: RE | Admit: 2019-04-08 | Discharge: 2019-04-08 | Disposition: A | Discharge status: Home / Self Care | Payer: Medicare Other | Source: Ambulatory Visit | Attending: Pulmonary Disease | Admitting: Pulmonary Disease

## 2019-04-08 DIAGNOSIS — Z23 Encounter for immunization: Secondary | ICD-10-CM | POA: Diagnosis not present

## 2019-04-08 DIAGNOSIS — U071 COVID-19: Secondary | ICD-10-CM | POA: Diagnosis not present

## 2019-04-08 MED ORDER — METHYLPREDNISOLONE SODIUM SUCC 125 MG IJ SOLR: 125.0000 mg | Freq: Once | INTRAMUSCULAR | Status: DC | PRN

## 2019-04-08 MED ORDER — DIPHENHYDRAMINE HCL 50 MG/ML IJ SOLN: 50.0000 mg | Freq: Once | INTRAMUSCULAR | Status: DC | PRN

## 2019-04-08 MED ORDER — EPINEPHRINE 0.3 MG/0.3ML IJ SOAJ: 0.3000 mg | Freq: Once | INTRAMUSCULAR | Status: DC | PRN

## 2019-04-08 MED ORDER — SODIUM CHLORIDE 0.9 % IV SOLN
700.0000 mg | Freq: Once | INTRAVENOUS | Status: AC
  Administered 2019-04-08: 15:00:00 700 mg via INTRAVENOUS
  Filled 2019-04-08: qty 20

## 2019-04-08 MED ORDER — FAMOTIDINE IN NACL 20-0.9 MG/50ML-% IV SOLN: 20.0000 mg | Freq: Once | INTRAVENOUS | Status: DC | PRN

## 2019-04-08 MED ORDER — SODIUM CHLORIDE 0.9 % IV SOLN: INTRAVENOUS | Status: DC | PRN

## 2019-04-08 MED ORDER — ALBUTEROL SULFATE HFA 108 (90 BASE) MCG/ACT IN AERS: 2.0000 | INHALATION_SPRAY | Freq: Once | RESPIRATORY_TRACT | Status: DC | PRN

## 2019-04-08 NOTE — Discharge Instructions (Signed)

## 2019-04-08 NOTE — Telephone Encounter (Signed)
Pt and wife calling with additional questions regarding infusion therapy. Met criteria and is scheduled today. Answered questions to pts satisfaction. States "Just wanted to make sure it was on the up and up."

## 2019-04-08 NOTE — Progress Notes (Signed)
  Diagnosis: COVID-19  Physician: Dr. Joya Gaskins   Procedure: [facility] Med: bamlanivimab infusion - Provided patient with bamlanimivab fact sheet for patients, parents and caregivers prior to infusion.  Complications: No immediate complications noted.  Discharge: Discharged home   [name] 04/08/2019

## 2019-04-13 ENCOUNTER — Emergency Department: Payer: Medicare Other

## 2019-04-13 ENCOUNTER — Emergency Department
Admission: EM | Admit: 2019-04-13 | Discharge: 2019-04-13 | Disposition: A | Discharge status: Home / Self Care | Payer: Medicare Other | Attending: Emergency Medicine | Admitting: Emergency Medicine

## 2019-04-13 ENCOUNTER — Ambulatory Visit: Payer: Self-pay | Admitting: *Deleted

## 2019-04-13 ENCOUNTER — Encounter: Payer: Self-pay | Admitting: Emergency Medicine

## 2019-04-13 ENCOUNTER — Other Ambulatory Visit: Payer: Self-pay

## 2019-04-13 DIAGNOSIS — E1122 Type 2 diabetes mellitus with diabetic chronic kidney disease: Secondary | ICD-10-CM | POA: Diagnosis not present

## 2019-04-13 DIAGNOSIS — N189 Chronic kidney disease, unspecified: Secondary | ICD-10-CM | POA: Insufficient documentation

## 2019-04-13 DIAGNOSIS — I129 Hypertensive chronic kidney disease with stage 1 through stage 4 chronic kidney disease, or unspecified chronic kidney disease: Secondary | ICD-10-CM | POA: Diagnosis not present

## 2019-04-13 DIAGNOSIS — U071 COVID-19: Secondary | ICD-10-CM | POA: Diagnosis not present

## 2019-04-13 DIAGNOSIS — Z79899 Other long term (current) drug therapy: Secondary | ICD-10-CM | POA: Insufficient documentation

## 2019-04-13 DIAGNOSIS — Z7982 Long term (current) use of aspirin: Secondary | ICD-10-CM | POA: Diagnosis not present

## 2019-04-13 DIAGNOSIS — I2510 Atherosclerotic heart disease of native coronary artery without angina pectoris: Secondary | ICD-10-CM | POA: Insufficient documentation

## 2019-04-13 DIAGNOSIS — R0602 Shortness of breath: Secondary | ICD-10-CM

## 2019-04-13 DIAGNOSIS — R05 Cough: Secondary | ICD-10-CM

## 2019-04-13 DIAGNOSIS — Z87891 Personal history of nicotine dependence: Secondary | ICD-10-CM | POA: Diagnosis not present

## 2019-04-13 DIAGNOSIS — R059 Cough, unspecified: Secondary | ICD-10-CM

## 2019-04-13 LAB — BASIC METABOLIC PANEL
Anion gap: 11 (ref 5–15)
BUN: 19 mg/dL (ref 8–23)
CO2: 22 mmol/L (ref 22–32)
Calcium: 8.9 mg/dL (ref 8.9–10.3)
Chloride: 101 mmol/L (ref 98–111)
Creatinine, Ser: 1.37 mg/dL — ABNORMAL HIGH (ref 0.61–1.24)
GFR calc Af Amer: 58 mL/min — ABNORMAL LOW (ref >60)
GFR calc non Af Amer: 50 mL/min — ABNORMAL LOW (ref >60)
Glucose, Bld: 146 mg/dL — ABNORMAL HIGH (ref 70–99)
Potassium: 4.2 mmol/L (ref 3.5–5.1)
Sodium: 134 mmol/L — ABNORMAL LOW (ref 135–145)

## 2019-04-13 LAB — TROPONIN I (HIGH SENSITIVITY)
Troponin I (High Sensitivity): 4 ng/L (ref <18)
Troponin I (High Sensitivity): 5 ng/L (ref <18)

## 2019-04-13 LAB — CBC
HCT: 42.9 % (ref 39.0–52.0)
Hemoglobin: 14.2 g/dL (ref 13.0–17.0)
MCH: 28.2 pg (ref 26.0–34.0)
MCHC: 33.1 g/dL (ref 30.0–36.0)
MCV: 85.1 fL (ref 80.0–100.0)
Platelets: 208 10*3/uL (ref 150–400)
RBC: 5.04 MIL/uL (ref 4.22–5.81)
RDW: 14.8 % (ref 11.5–15.5)
WBC: 10 10*3/uL (ref 4.0–10.5)
nRBC: 0 % (ref 0.0–0.2)

## 2019-04-13 MED ORDER — ALBUTEROL SULFATE HFA 108 (90 BASE) MCG/ACT IN AERS
2.0000 | INHALATION_SPRAY | Freq: Once | RESPIRATORY_TRACT | Status: AC
  Administered 2019-04-13: 2 via RESPIRATORY_TRACT
  Filled 2019-04-13: qty 6.7

## 2019-04-13 MED ORDER — SODIUM CHLORIDE 0.9% FLUSH: 3.0000 mL | Freq: Once | INTRAVENOUS | Status: DC

## 2019-04-13 NOTE — ED Triage Notes (Signed)
Pt here with c/o cough and shob, was diagnosed with covid a few weeks ago, states he has to cough with every deep breath. Sats 100% in triage, no shob or distress noted, pt was to be taken off precautions tomorrow, joking with ED staff in triage.

## 2019-04-13 NOTE — Telephone Encounter (Signed)
Pt called with SOB; he tested + COVID 04/06/19; the pt says he is coughing a lot and wheezing; he has sob with exertion, and coughing; this started on 04/11/19; the pt says he is coughing up a small amount of clear secretions; he has also had the infusion therapy 04/08/19; recommendations made per nurse triage protocol;the verbalized understanding; the pt  sees [employer], [facility]; pt transferred to [facility] for scheduling.  Reason for Disposition . MILD difficulty breathing (e.g., minimal/no SOB at rest, SOB with walking, pulse <100)  Answer Assessment - Initial Assessment Questions 1. COVID-19 DIAGNOSIS: "Who made your Coronavirus (COVID-19) diagnosis?" "Was it confirmed by a positive lab test?" If not diagnosed by a HCP, ask "Are there lots of cases (community spread) where you live?" (See public health department website, if unsure)     Positive 04/06/19 2. COVID-19 EXPOSURE: "Was there any known exposure to COVID before the symptoms began?" CDC Definition of close contact: within 6 feet (2 meters) for a total of 15 minutes or more over a 24-hour period.      yes 3. ONSET: "When did the COVID-19 symptoms start?"     Tested positive 04/06/19 4. WORST SYMPTOM: "What is your worst symptom?" (e.g., cough, fever, shortness of breath, muscle aches) SOB with exertion 5. COUGH: "Do you have a cough?" If so, ask: "How bad is the cough?"     Yes; small amt of clear secretions 6. FEVER: "Do you have a fever?" If so, ask: "What is your temperature, how was it measured, and when did it start?"      7. RESPIRATORY STATUS: "Describe your breathing?" (e.g., shortness of breath, wheezing, unable to speak)      Sob with exertion 8. BETTER-SAME-WORSE: "Are you getting better, staying the same or getting worse compared to yesterday?"  If getting worse, ask, "In what way?"    9. HIGH RISK DISEASE: "Do you have any chronic medical problems?" (e.g., asthma, heart or lung disease, weak immune system, obesity,  etc.)   Hx MI, stents 10. PREGNANCY: "Is there any chance you are pregnant?" "When was your last menstrual period?"    n/a 11. OTHER SYMPTOMS: "Do you have any other symptoms?"  (e.g., chills, fatigue, headache, loss of smell or taste, muscle pain, sore throat; new loss of smell or taste especially support the diagnosis of COVID-19)  Protocols used: CORONAVIRUS (COVID-19) DIAGNOSED OR SUSPECTED-A-AH

## 2019-04-13 NOTE — ED Provider Notes (Signed)
[facility] Emergency Department Provider Note   ____________________________________________   First MD Initiated Contact with Patient 04/13/19 1254     (approximate)  I have reviewed the triage vital signs and the nursing notes.   HISTORY  Chief Complaint Shortness of Breath, Chest Pain, and Covid +    HPI Luke Rojas is a 74 y.o. male: With possible history of CAD, hypertension, and diabetes who presents to the ED complaining of cough.  Patient reports that he was diagnosed with COVID-19 back on February 1, and has been dealing with some cough and shortness of breath since then.  He states that every time he goes to take a deep breath he starts having a coughing fit and has some soreness in his chest with coughing.  He denies any fevers and has not had any pain in his chest without coughing.  He previously completed a course of steroids and azithromycin through his PCP, also received outpatient remdesivir infusions.  He has been feeling better overall but continues to have some cough and shortness of breath, spoke to his PCPs office today who referred him to the ED for reevaluation.        Past Medical History:  Diagnosis Date  . Chronic kidney disease    kidney stones  . Coronary artery disease    2 stents   . Diabetes mellitus without complication ([place])   . GERD (gastroesophageal reflux disease)   . Hypercholesteremia   . Hypertension   . Myocardial infarction [facility]) 2013    Patient Active Problem List   Diagnosis Date Noted  . Diabetes ([place]) 12/04/2018  . Chest pain 11/24/2018  . Coronary artery disease involving native coronary artery without angina pectoris 08/05/2017  . Lumbar radiculopathy (R-sided) L4 07/04/2017  . 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 Lumbar facet arthropathy 07/04/2017  . 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 Foraminal stenosis of thoracic region 07/04/2017  . Chronic pain  syndrome 07/04/2017  . Encounter for screening colonoscopy 05/04/2015  . Arthritis 10/08/2014  . H/O acute myocardial infarction 10/08/2014  . Acid reflux 10/08/2014  . HLD (hyperlipidemia) 10/08/2014  . Benign neoplasm of skin 07/19/2006  . Essential (primary) hypertension 11/15/2005  . Decreased potassium in the blood 11/15/2005    Past Surgical History:  Procedure Laterality Date  . CARDIAC CATHETERIZATION  2013   2 stents  . COLONOSCOPY  04/27/05  . COLONOSCOPY WITH PROPOFOL N/A 06/08/2015   Procedure: COLONOSCOPY WITH PROPOFOL;  Surgeon: Robert Bellow, MD;  Location: [facility] ENDOSCOPY;  Service: Endoscopy;  Laterality: N/A;  . CYSTOSCOPY WITH STENT PLACEMENT Right 09/16/2016   Procedure: CYSTOSCOPY WITH STENT PLACEMENT;  Surgeon: Royston Cowper, MD;  Location: [facility] ORS;  Service: Urology;  Laterality: Right;  . EXTRACORPOREAL SHOCK WAVE LITHOTRIPSY Left 11/04/2014   has had 2 previous lithotripsies  . EXTRACORPOREAL SHOCK WAVE LITHOTRIPSY Left 03/24/2015   Procedure: EXTRACORPOREAL SHOCK WAVE LITHOTRIPSY (ESWL);  Surgeon: Royston Cowper, MD;  Location: [facility] ORS;  Service: Urology;  Laterality: Left;  . EXTRACORPOREAL SHOCK WAVE LITHOTRIPSY Right 10/04/2016   Procedure: EXTRACORPOREAL SHOCK WAVE LITHOTRIPSY (ESWL);  Surgeon: Royston Cowper, MD;  Location: [facility] ORS;  Service: Urology;  Laterality: Right;  . LEFT HEART CATH AND CORONARY ANGIOGRAPHY N/A 12/05/2018   Procedure: LEFT HEART CATH AND CORONARY ANGIOGRAPHY with possible pci and stent;  Surgeon: Yolonda Kida, MD;  Location: [facility] LAB;  Service: Cardiovascular;  Laterality: N/A;  Prior to Admission medications   Medication Sig Start Date End Date Taking? Authorizing Provider  amLODipine (NORVASC) 5 MG tablet Take 5 mg by mouth daily.  03/10/18 04/02/19  [provider]  aspirin EC 81 MG tablet Take 81 mg by mouth daily.    [provider]  atorvastatin (LIPITOR) 40 MG tablet Take 1 tablet by  mouth at bedtime.  09/29/11   [provider]  azithromycin (ZITHROMAX) 250 MG tablet Take 2 tablets PO on day one, and one tablet PO daily thereafter until completed. 04/06/19   Mar Daring, PA-C  benzonatate (TESSALON) 200 MG capsule Take 1 capsule (200 mg total) by mouth 3 (three) times daily as needed. 04/06/19   Mar Daring, PA-C  clopidogrel (PLAVIX) 75 MG tablet Take 1 tablet (75 mg total) by mouth daily. 12/05/18 12/05/19  Max Sane, MD  co-enzyme Q-10 30 MG capsule Take 30 mg by mouth daily.     [provider]  diclofenac sodium (VOLTAREN) 1 % GEL Apply 2 g topically 4 (four) times daily. 07/08/17   Chrismon, Vickki Muff, PA  finasteride (PROSCAR) 5 MG tablet Take 5 mg by mouth daily.  06/26/16   [provider]  ibuprofen (ADVIL) 600 MG tablet Take 600 mg by mouth every 6 (six) hours as needed.  12/22/18   [provider]  isosorbide mononitrate (IMDUR) 60 MG 24 hr tablet Take 60 mg by mouth daily.    [provider]  Javier Docker Oil 1000 MG CAPS Take 1 mg by mouth daily.    [provider]  metoprolol tartrate (LOPRESSOR) 25 MG tablet Take 1 tablet (25 mg total) by mouth 2 (two) times daily. 11/25/18   Gladstone Lighter, MD  MULTIPLE VITAMIN PO Take 1 tablet by mouth daily.    [provider]  nitroGLYCERIN (NITROSTAT) 0.4 MG SL tablet Place 1 tablet (0.4 mg total) under the tongue every 5 (five) minutes as needed for chest pain. 11/25/18   Gladstone Lighter, MD  Omega-3 Fatty Acids (FISH OIL) 1200 MG CAPS Take 1 capsule by mouth daily.     [provider]  omeprazole (PRILOSEC) 20 MG capsule Take 20 mg by mouth daily.  01/26/15   [provider]  pantoprazole (PROTONIX) 40 MG tablet Take 40 mg by mouth daily. 01/21/19   [provider]  predniSONE (STERAPRED UNI-PAK 21 TAB) 10 MG (21) TBPK tablet 6 day taper; take as directed on packaged instructions 04/06/19   Mar Daring, PA-C  ramipril  (ALTACE) 10 MG capsule Take 1 capsule (10 mg total) by mouth daily. 11/25/18   Gladstone Lighter, MD  sulfamethoxazole-trimethoprim (BACTRIM DS) 800-160 MG tablet Take 1 tablet by mouth 2 (two) times daily. 02/12/19   [provider]  tamsulosin (FLOMAX) 0.4 MG CAPS capsule Take 0.4 mg by mouth daily. 02/12/19   [provider]    Allergies Codeine  Family History  Problem Relation Age of Onset  . Pulmonary embolism Mother   . Transient ischemic attack Mother   . Diabetes Mother   . Pancreatic cancer Father   . Hypertension Father   . Diabetes Father   . Cirrhosis Brother     Social History Social History   Tobacco Use  . Smoking status: Former Smoker    Packs/day: 1.00    Years: 30.00    Pack years: 30.00    Types: Cigarettes    Quit date: 03/05/1984    Years since quitting: 35.1  . Smokeless  tobacco: Current User    Types: Chew  Substance Use Topics  . Alcohol use: No    Alcohol/week: 0.0 standard drinks  . Drug use: No    Review of Systems  Constitutional: No fever/chills Eyes: No visual changes. ENT: No sore throat. Cardiovascular: Positive for chest pain. Respiratory: Positive for cough and shortness of breath. Gastrointestinal: No abdominal pain.  No nausea, no vomiting.  No diarrhea.  No constipation. Genitourinary: Negative for dysuria. Musculoskeletal: Negative for back pain. Skin: Negative for rash. Neurological: Negative for headaches, focal weakness or numbness.  ____________________________________________   PHYSICAL EXAM:  VITAL SIGNS: ED Triage Vitals  Enc Vitals Group     BP 04/13/19 1104 122/80     Pulse Rate 04/13/19 1104 84     Resp 04/13/19 1104 16     Temp 04/13/19 1104 98.1 F (36.7 C)     Temp Source 04/13/19 1104 Oral     SpO2 04/13/19 1104 100 %     Weight 04/13/19 1107 185 lb (83.9 kg)     Height 04/13/19 1107 5\' 8"  (1.727 m)     Head Circumference --      Peak Flow --      Pain Score 04/13/19 1107 2      Pain Loc --      Pain Edu? --      Excl. in [place]? --     Constitutional: Alert and oriented. Eyes: Conjunctivae are normal. Head: Atraumatic. Nose: No congestion/rhinnorhea. Mouth/Throat: Mucous membranes are moist. Neck: Normal ROM Cardiovascular: Normal rate, regular rhythm. Grossly normal heart sounds. Respiratory: Normal respiratory effort.  No retractions. Lungs CTAB. Gastrointestinal: Soft and nontender. No distention. Genitourinary: deferred Musculoskeletal: No lower extremity tenderness nor edema. Neurologic:  Normal speech and language. No gross focal neurologic deficits are appreciated. Skin:  Skin is warm, dry and intact. No rash noted. Psychiatric: Mood and affect are normal. Speech and behavior are normal.  ____________________________________________   LABS (all labs ordered are listed, but only abnormal results are displayed)  Labs Reviewed  BASIC METABOLIC PANEL - Abnormal; Notable for the following components:      Result Value   Sodium 134 (*)    Glucose, Bld 146 (*)    Creatinine, Ser 1.37 (*)    GFR calc non Af Amer 50 (*)    GFR calc Af Amer 58 (*)    All other components within normal limits  CBC  TROPONIN I (HIGH SENSITIVITY)  TROPONIN I (HIGH SENSITIVITY)   ____________________________________________  EKG  ED ECG REPORT I, Blake Divine, the attending physician, personally viewed and interpreted this ECG.   Date: 04/13/2019  EKG Time: 11:06  Rate: 82  Rhythm: normal EKG, normal sinus rhythm, unchanged from previous tracings  Axis: Normal  Intervals:none  ST&T Change: None   PROCEDURES  Procedure(s) performed (including Critical Care):  Procedures   ____________________________________________   INITIAL IMPRESSION / ASSESSMENT AND PLAN / ED COURSE       74 year old male with history of CAD and recent diagnosis of COVID-19 7 days ago presents to the ED with ongoing shortness of breath and cough, has some occasional chest pain  when coughing.  Doubt cardiac etiology of his pain, EKG without any acute ischemic changes and 2 sets of troponin are negative.  He denies any shortness of breath here at rest, primarily complains of cough.  Chest x-ray is unremarkable and remainder of labs are reassuring.  He felt better following a dose of albuterol, cough and shortness  of breath are improved.  He will be provided with inhaler for home use and I have counseled him to follow-up with his PCP, otherwise return to the ED for new worsening symptoms.  Patient agrees with plan.      ____________________________________________   FINAL CLINICAL IMPRESSION(S) / ED DIAGNOSES  Final diagnoses:  COVID-19 virus infection  Cough  Shortness of breath     ED Discharge Orders    None       Note:  This document was prepared using Dragon voice recognition software and may include unintentional dictation errors.   Blake Divine, MD 04/13/19 1721

## 2019-04-13 NOTE — ED Notes (Signed)
Called pharmacy to find out about medication that we are still waiting on.  Pharmacy states they are about to tube it

## 2019-04-13 NOTE — ED Notes (Signed)
Called Pharmacy and requested medication to be sent up

## 2019-04-13 NOTE — Telephone Encounter (Signed)
Agree with plan 

## 2019-04-13 NOTE — Telephone Encounter (Signed)
After talking with the patient it was apparent that with deeper breathing he would have increased cough.  He is unable to cough anything up and complained of having chest pain/tightness.  While on the phone you could hear him becoming more winded with talk.  Patient was advised to go to the ER for evaluation, and possible imaging for pneumonia and possible need for nebulizer.

## 2019-04-14 ENCOUNTER — Ambulatory Visit: Payer: Self-pay | Admitting: *Deleted

## 2019-04-14 NOTE — Telephone Encounter (Signed)
Diagnosed with Covid 19 04/06/19.  Seen in the ED yesterday with SOB. Prescribed Albuterol inhaler, mucinex  andTessalon Perles. Labs/xrays normal.  Calling today with intermittent chest tightness.Instructed patient on use of inhaler, increase fluid intake with mucinex andTessalon Perles. Discussed using all medicines as directed for the next few days. Rest and increase activity slowly. If wheezing/chest/cough worsens call back or if after hours seek treatment at the ED with a mask on. Take tylenol or ibuprofen for fever or discomfort. Patient verbalized understanding of all Care advice and instructions.   Note-No CP protocol initiated. Covid 19 care instructions discussed with the patient. Patient much appreciate of information and time.

## 2019-08-07 ENCOUNTER — Telehealth: Payer: Self-pay | Admitting: Family Medicine

## 2019-08-07 NOTE — Telephone Encounter (Signed)
Patient's wife is calling to see if Simona Huh receive documentation that was dropped off of [employer]- [phone]

## 2019-08-07 NOTE — Telephone Encounter (Signed)
It has been waiting for pick up at the front desk file.

## 2019-08-07 NOTE — Telephone Encounter (Signed)
Advised 

## 2019-09-14 ENCOUNTER — Encounter: Payer: Self-pay | Admitting: Family Medicine

## 2019-09-14 ENCOUNTER — Ambulatory Visit (INDEPENDENT_AMBULATORY_CARE_PROVIDER_SITE_OTHER): Payer: Medicare Other | Admitting: Family Medicine

## 2019-09-14 ENCOUNTER — Other Ambulatory Visit: Payer: Self-pay

## 2019-09-14 VITALS — BP 126/74 | HR 65 | Temp 96.8°F | Ht 68.0 in | Wt 188.2 lb

## 2019-09-14 DIAGNOSIS — J029 Acute pharyngitis, unspecified: Secondary | ICD-10-CM

## 2019-09-14 DIAGNOSIS — R0982 Postnasal drip: Secondary | ICD-10-CM | POA: Diagnosis not present

## 2019-09-14 MED ORDER — AMOXICILLIN 875 MG PO TABS: 875.0000 mg | ORAL_TABLET | Freq: Two times a day (BID) | ORAL | 0 refills | Status: DC

## 2019-09-14 NOTE — Progress Notes (Signed)
Established patient visit   Patient: Luke Rojas   DOB: December 22, 1945   74 y.o. Male  MRN: [id number] Visit Date: 09/14/2019  Today's healthcare provider: Vernie Murders, PA   Chief Complaint  Patient presents with  . Dysphagia   Subjective    HPI   Patient presents today for difficulty swallowing that started yesterday around dinner time, patient says that throat is only hurting on right side. Patient is not complaining of any other symptoms. Denies any fever, earache, cough, congestion, sneezing, headache, fatigue or loss of sense of taste.  Past Medical History:  Diagnosis Date  . Chronic kidney disease    kidney stones  . Coronary artery disease    2 stents   . Diabetes mellitus without complication ([place])   . GERD (gastroesophageal reflux disease)   . Hypercholesteremia   . Hypertension   . Myocardial infarction [facility]) 2013   Past Surgical History:  Procedure Laterality Date  . CARDIAC CATHETERIZATION  2013   2 stents  . COLONOSCOPY  04/27/05  . COLONOSCOPY WITH PROPOFOL N/A 06/08/2015   Procedure: COLONOSCOPY WITH PROPOFOL;  Surgeon: Robert Bellow, MD;  Location: [facility] ENDOSCOPY;  Service: Endoscopy;  Laterality: N/A;  . CYSTOSCOPY WITH STENT PLACEMENT Right 09/16/2016   Procedure: CYSTOSCOPY WITH STENT PLACEMENT;  Surgeon: Royston Cowper, MD;  Location: [facility] ORS;  Service: Urology;  Laterality: Right;  . EXTRACORPOREAL SHOCK WAVE LITHOTRIPSY Left 11/04/2014   has had 2 previous lithotripsies  . EXTRACORPOREAL SHOCK WAVE LITHOTRIPSY Left 03/24/2015   Procedure: EXTRACORPOREAL SHOCK WAVE LITHOTRIPSY (ESWL);  Surgeon: Royston Cowper, MD;  Location: [facility] ORS;  Service: Urology;  Laterality: Left;  . EXTRACORPOREAL SHOCK WAVE LITHOTRIPSY Right 10/04/2016   Procedure: EXTRACORPOREAL SHOCK WAVE LITHOTRIPSY (ESWL);  Surgeon: Royston Cowper, MD;  Location: [facility] ORS;  Service: Urology;  Laterality: Right;  . LEFT HEART CATH AND CORONARY ANGIOGRAPHY N/A 12/05/2018    Procedure: LEFT HEART CATH AND CORONARY ANGIOGRAPHY with possible pci and stent;  Surgeon: Yolonda Kida, MD;  Location: [place] CV LAB;  Service: Cardiovascular;  Laterality: N/A;   Social History   Tobacco Use  . Smoking status: Former Smoker    Packs/day: 1.00    Years: 30.00    Pack years: 30.00    Types: Cigarettes    Quit date: 03/05/1984    Years since quitting: 35.5  . Smokeless tobacco: Current User    Types: Chew  Vaping Use  . Vaping Use: Never used  Substance Use Topics  . Alcohol use: No    Alcohol/week: 0.0 standard drinks  . Drug use: No   Family Status  Relation Name Status  . Mother  Deceased at age 66  . Father  Deceased at age 35  . Sister  Alive  . Brother  Deceased       POISIONING  . Brother  Deceased  . Brother  Deceased at age 55       MENINGITIS   Allergies  Allergen Reactions  . Codeine Nausea Only and Nausea And Vomiting       Medications: Outpatient Medications Prior to Visit  Medication Sig  . amLODipine (NORVASC) 5 MG tablet Take 5 mg by mouth daily.   Marland Kitchen aspirin EC 81 MG tablet Take 81 mg by mouth daily.  Marland Kitchen atorvastatin (LIPITOR) 40 MG tablet Take 1 tablet by mouth at bedtime.   Marland Kitchen azithromycin (ZITHROMAX) 250 MG tablet Take 2 tablets PO on day one, and one  tablet PO daily thereafter until completed.  . benzonatate (TESSALON) 200 MG capsule Take 1 capsule (200 mg total) by mouth 3 (three) times daily as needed.  . clopidogrel (PLAVIX) 75 MG tablet Take 1 tablet (75 mg total) by mouth daily.  Marland Kitchen co-enzyme Q-10 30 MG capsule Take 30 mg by mouth daily.   . diclofenac sodium (VOLTAREN) 1 % GEL Apply 2 g topically 4 (four) times daily.  . finasteride (PROSCAR) 5 MG tablet Take 5 mg by mouth daily.   Marland Kitchen ibuprofen (ADVIL) 600 MG tablet Take 600 mg by mouth every 6 (six) hours as needed.   . isosorbide mononitrate (IMDUR) 60 MG 24 hr tablet Take 60 mg by mouth daily.  Javier Docker Oil 1000 MG CAPS Take 1 mg by mouth daily.  . metoprolol  tartrate (LOPRESSOR) 25 MG tablet Take 1 tablet (25 mg total) by mouth 2 (two) times daily.  . MULTIPLE VITAMIN PO Take 1 tablet by mouth daily.  . nitroGLYCERIN (NITROSTAT) 0.4 MG SL tablet Place 1 tablet (0.4 mg total) under the tongue every 5 (five) minutes as needed for chest pain.  . Omega-3 Fatty Acids (FISH OIL) 1200 MG CAPS Take 1 capsule by mouth daily.   Marland Kitchen omeprazole (PRILOSEC) 20 MG capsule Take 20 mg by mouth daily.   . pantoprazole (PROTONIX) 40 MG tablet Take 40 mg by mouth daily.  . predniSONE (STERAPRED UNI-PAK 21 TAB) 10 MG (21) TBPK tablet 6 day taper; take as directed on packaged instructions  . ramipril (ALTACE) 10 MG capsule Take 1 capsule (10 mg total) by mouth daily.  Marland Kitchen sulfamethoxazole-trimethoprim (BACTRIM DS) 800-160 MG tablet Take 1 tablet by mouth 2 (two) times daily.  . tamsulosin (FLOMAX) 0.4 MG CAPS capsule Take 0.4 mg by mouth daily.   No facility-administered medications prior to visit.    Review of Systems  Constitutional: Negative.   HENT: Positive for sore throat. Negative for rhinorrhea, sinus pressure, sinus pain and sneezing.   Respiratory: Negative.   Cardiovascular: Negative.   Gastrointestinal: Negative.      Objective    BP 126/74 (BP Location: Right Arm, Patient Position: Sitting, Cuff Size: Large)   Pulse 65   Temp (!) 96.8 F (36 C) (Temporal)   Ht 5\' 8"  (1.727 m)   Wt 188 lb 3.2 oz (85.4 kg)   BMI 28.62 kg/m  BP Readings from Last 3 Encounters:  09/14/19 126/74  04/13/19 125/85  04/08/19 (!) 156/70   Wt Readings from Last 3 Encounters:  09/14/19 188 lb 3.2 oz (85.4 kg)  04/13/19 185 lb (83.9 kg)  02/13/19 185 lb 6.4 oz (84.1 kg)   Physical Exam Constitutional:      General: He is not in acute distress.    Appearance: He is well-developed.  HENT:     Head: Normocephalic and atraumatic.     Right Ear: Hearing and tympanic membrane normal.     Left Ear: Hearing and tympanic membrane normal.     Nose: Nose normal.      Mouth/Throat:     Comments: Slightly red with yellow PND.  Eyes:     General: Lids are normal. No scleral icterus.       Right eye: No discharge.        Left eye: No discharge.     Conjunctiva/sclera: Conjunctivae normal.  Cardiovascular:     Rate and Rhythm: Normal rate and regular rhythm.     Heart sounds: Normal heart sounds.  Pulmonary:  Effort: Pulmonary effort is normal. No respiratory distress.     Breath sounds: Normal breath sounds.  Abdominal:     General: Bowel sounds are normal.     Palpations: Abdomen is soft.  Musculoskeletal:        General: Normal range of motion.     Cervical back: Neck supple.  Lymphadenopathy:     Cervical: No cervical adenopathy.  Skin:    Findings: No lesion or rash.  Neurological:     Mental Status: He is alert and oriented to person, place, and time.  Psychiatric:        Speech: Speech normal.        Behavior: Behavior normal.        Thought Content: Thought content normal.     No results found for any visits on 09/14/19.  Assessment & Plan     1. Pharyngitis, unspecified etiology Right sided sore throat with swallowing that started yesterday. No cough, congestion, earache or fever. Throat slightly red with yellow drainage. May use Mucinex and saltwater gargles. Increase fluid intake and add antibiotic. Recheck if no better in 5-7 days. May use antihistamine and/or steroid nasal spray as needed. - amoxicillin (AMOXIL) 875 MG tablet; Take 1 tablet (875 mg total) by mouth 2 (two) times daily.  Dispense: 20 tablet; Refill: 0  2. PND (post-nasal drip) Yellow mucus drainage in posterior pharynx but patient did not feel congested or have sinus pressure. Had COVID vaccinations couple months ago.    No follow-ups on file.         Vernie Murders, [place] [phone] (phone) [phone] (fax)  [place]

## 2019-09-14 NOTE — Patient Instructions (Signed)
Postnasal Drip Postnasal drip is the feeling of mucus going down the back of your throat. Mucus is a slimy substance that moistens and cleans your nose and throat, as well as the air pockets in face bones near your forehead and cheeks (sinuses). Small amounts of mucus pass from your nose and sinuses down the back of your throat all the time. This is normal. When you produce too much mucus or the mucus gets too thick, you can feel it. Some common causes of postnasal drip include:  Having more mucus because of: ? A cold or the flu. ? Allergies. ? Cold air. ? Certain medicines.  Having more mucus that is thicker because of: ? A sinus or nasal infection. ? Dry air. ? A food allergy. Follow these instructions at home: Relieving discomfort   Gargle with a salt-water mixture 3-4 times a day or as needed. To make a salt-water mixture, completely dissolve -1 tsp of salt in 1 cup of warm water.  If the air in your home is dry, use a humidifier to add moisture to the air.  Use a saline spray or container (neti pot) to flush out the nose (nasal irrigation). These methods can help clear away mucus and keep the nasal passages moist. General instructions  Take over-the-counter and prescription medicines only as told by your health care provider.  Follow instructions from your health care provider about eating or drinking restrictions. You may need to avoid caffeine.  Avoid things that you know you are allergic to (allergens), like dust, mold, pollen, pets, or certain foods.  Drink enough fluid to keep your urine pale yellow.  Keep all follow-up visits as told by your health care provider. This is important. Contact a health care provider if:  You have a fever.  You have a sore throat.  You have difficulty swallowing.  You have headache.  You have sinus pain.  You have a cough that does not go away.  The mucus from your nose becomes thick and is green or yellow in color.  You have  cold or flu symptoms that last more than 10 days. Summary  Postnasal drip is the feeling of mucus going down the back of your throat.  If your health care provider approves, use nasal irrigation or a nasal spray 2?4 times a day.  Avoid things that you know you are allergic to (allergens), like dust, mold, pollen, pets, or certain foods. This information is not intended to replace advice given to you by your health care provider. Make sure you discuss any questions you have with your health care provider. Document Revised: 06/13/2018 Document Reviewed: 06/04/2016 Elsevier Patient Education  [place].

## 2019-11-03 ENCOUNTER — Encounter: Payer: Self-pay | Admitting: Family Medicine

## 2019-11-03 LAB — HM DIABETES EYE EXAM

## 2019-11-04 ENCOUNTER — Encounter: Payer: Self-pay | Admitting: *Deleted

## 2019-12-01 ENCOUNTER — Ambulatory Visit (INDEPENDENT_AMBULATORY_CARE_PROVIDER_SITE_OTHER): Payer: Medicare Other | Admitting: Family Medicine

## 2019-12-01 ENCOUNTER — Encounter: Payer: Self-pay | Admitting: Family Medicine

## 2019-12-01 ENCOUNTER — Other Ambulatory Visit: Payer: Self-pay

## 2019-12-01 VITALS — BP 117/55 | HR 60 | Temp 97.4°F | Wt 188.0 lb

## 2019-12-01 DIAGNOSIS — M545 Low back pain, unspecified: Secondary | ICD-10-CM

## 2019-12-01 DIAGNOSIS — I10 Essential (primary) hypertension: Secondary | ICD-10-CM

## 2019-12-01 DIAGNOSIS — I2510 Atherosclerotic heart disease of native coronary artery without angina pectoris: Secondary | ICD-10-CM

## 2019-12-01 DIAGNOSIS — E119 Type 2 diabetes mellitus without complications: Secondary | ICD-10-CM | POA: Diagnosis not present

## 2019-12-01 DIAGNOSIS — E782 Mixed hyperlipidemia: Secondary | ICD-10-CM

## 2019-12-01 MED ORDER — BACLOFEN 10 MG PO TABS: 10.0000 mg | ORAL_TABLET | Freq: Three times a day (TID) | ORAL | 0 refills | Status: DC

## 2019-12-01 MED ORDER — PREDNISONE 10 MG (21) PO TBPK: ORAL_TABLET | ORAL | 0 refills | Status: DC

## 2019-12-01 NOTE — Progress Notes (Signed)
Acute Office Visit  Subjective:    Patient ID: Luke Rojas, male    DOB: March 10, 1945, 74 y.o.   MRN: [id number]  CC: Left back pain.  HPI Patient is in today for low back pain.  He has history of arthritis and states a few weeks ago he has had more pain.  He notes no specific injury or episode that exacerbated the pain.  There is no radiation of the pain.  He has been using Voltaren cream with no relief.  Past Medical History:  Diagnosis Date  . Chronic kidney disease    kidney stones  . Coronary artery disease    2 stents   . Diabetes mellitus without complication ([place])   . GERD (gastroesophageal reflux disease)   . Hypercholesteremia   . Hypertension   . Myocardial infarction [facility]) 2013    Past Surgical History:  Procedure Laterality Date  . CARDIAC CATHETERIZATION  2013   2 stents  . COLONOSCOPY  04/27/05  . COLONOSCOPY WITH PROPOFOL N/A 06/08/2015   Procedure: COLONOSCOPY WITH PROPOFOL;  Surgeon: Robert Bellow, MD;  Location: [facility] ENDOSCOPY;  Service: Endoscopy;  Laterality: N/A;  . CYSTOSCOPY WITH STENT PLACEMENT Right 09/16/2016   Procedure: CYSTOSCOPY WITH STENT PLACEMENT;  Surgeon: Royston Cowper, MD;  Location: [facility] ORS;  Service: Urology;  Laterality: Right;  . EXTRACORPOREAL SHOCK WAVE LITHOTRIPSY Left 11/04/2014   has had 2 previous lithotripsies  . EXTRACORPOREAL SHOCK WAVE LITHOTRIPSY Left 03/24/2015   Procedure: EXTRACORPOREAL SHOCK WAVE LITHOTRIPSY (ESWL);  Surgeon: Royston Cowper, MD;  Location: [facility] ORS;  Service: Urology;  Laterality: Left;  . EXTRACORPOREAL SHOCK WAVE LITHOTRIPSY Right 10/04/2016   Procedure: EXTRACORPOREAL SHOCK WAVE LITHOTRIPSY (ESWL);  Surgeon: Royston Cowper, MD;  Location: [facility] ORS;  Service: Urology;  Laterality: Right;  . LEFT HEART CATH AND CORONARY ANGIOGRAPHY N/A 12/05/2018   Procedure: LEFT HEART CATH AND CORONARY ANGIOGRAPHY with possible pci and stent;  Surgeon: Yolonda Kida, MD;  Location: [place] CV LAB;   Service: Cardiovascular;  Laterality: N/A;    Family History  Problem Relation Age of Onset  . Pulmonary embolism Mother   . Transient ischemic attack Mother   . Diabetes Mother   . Pancreatic cancer Father   . Hypertension Father   . Diabetes Father   . Cirrhosis Brother     Social History   Socioeconomic History  . Marital status: Married    Spouse name: Not on file  . Number of children: 2  . Years of education: Not on file  . Highest education level: 8th grade  Occupational History  . Occupation: retired  Tobacco Use  . Smoking status: Former Smoker    Packs/day: 1.00    Years: 30.00    Pack years: 30.00    Types: Cigarettes    Quit date: 03/05/1984    Years since quitting: 35.7  . Smokeless tobacco: Current User    Types: Chew  Vaping Use  . Vaping Use: Never used  Substance and Sexual Activity  . Alcohol use: No    Alcohol/week: 0.0 standard drinks  . Drug use: No  . Sexual activity: Not on file  Other Topics Concern  . Not on file  Social History Narrative  . Not on file   Social Determinants of Health   Financial Resource Strain: Low Risk   . Difficulty of Paying Living Expenses: Not hard at all  Food Insecurity: No Food Insecurity  . Worried About [employer] of  Food in the Last Year: Never true  . Ran Out of Food in the Last Year: Never true  Transportation Needs: No Transportation Needs  . Lack of Transportation (Medical): No  . Lack of Transportation (Non-Medical): No  Physical Activity: Insufficiently Active  . Days of Exercise per Week: 2 days  . Minutes of Exercise per Session: 30 min  Stress: No Stress Concern Present  . Feeling of Stress : Not at all  Social Connections: Moderately Integrated  . Frequency of Communication with Friends and Family: More than three times a week  . Frequency of Social Gatherings with Friends and Family: More than three times a week  . Attends Religious Services: More than 4 times per year  . Active Member of  Clubs or Organizations: No  . Attends Archivist Meetings: Never  . Marital Status: Married  Human resources officer Violence: Not At Risk  . Fear of Current or Ex-Partner: No  . Emotionally Abused: No  . Physically Abused: No  . Sexually Abused: No    Outpatient Medications Prior to Visit  Medication Sig Dispense Refill  . amLODipine (NORVASC) 5 MG tablet Take 5 mg by mouth daily.     Marland Kitchen amoxicillin (AMOXIL) 875 MG tablet Take 1 tablet (875 mg total) by mouth 2 (two) times daily. 20 tablet 0  . aspirin EC 81 MG tablet Take 81 mg by mouth daily. (Patient not taking: Reported on 09/14/2019)    . atorvastatin (LIPITOR) 40 MG tablet Take 1 tablet by mouth at bedtime.     Marland Kitchen azithromycin (ZITHROMAX) 250 MG tablet Take 2 tablets PO on day one, and one tablet PO daily thereafter until completed. (Patient not taking: Reported on 09/14/2019) 6 tablet 0  . benzonatate (TESSALON) 200 MG capsule Take 1 capsule (200 mg total) by mouth 3 (three) times daily as needed. (Patient not taking: Reported on 09/14/2019) 30 capsule 0  . clopidogrel (PLAVIX) 75 MG tablet Take 1 tablet (75 mg total) by mouth daily. 30 tablet 11  . co-enzyme Q-10 30 MG capsule Take 30 mg by mouth daily.     . diclofenac sodium (VOLTAREN) 1 % GEL Apply 2 g topically 4 (four) times daily. 100 g 3  . finasteride (PROSCAR) 5 MG tablet Take 5 mg by mouth daily.     Marland Kitchen ibuprofen (ADVIL) 600 MG tablet Take 600 mg by mouth every 6 (six) hours as needed.     . isosorbide mononitrate (IMDUR) 60 MG 24 hr tablet Take 60 mg by mouth daily.    Javier Docker Oil 1000 MG CAPS Take 1 mg by mouth daily.    . metoprolol tartrate (LOPRESSOR) 25 MG tablet Take 1 tablet (25 mg total) by mouth 2 (two) times daily. 60 tablet 2  . MULTIPLE VITAMIN PO Take 1 tablet by mouth daily.    . nitroGLYCERIN (NITROSTAT) 0.4 MG SL tablet Place 1 tablet (0.4 mg total) under the tongue every 5 (five) minutes as needed for chest pain. 30 tablet 1  . Omega-3 Fatty Acids (FISH  OIL) 1200 MG CAPS Take 1 capsule by mouth daily.     Marland Kitchen omeprazole (PRILOSEC) 20 MG capsule Take 20 mg by mouth daily.     . pantoprazole (PROTONIX) 40 MG tablet Take 40 mg by mouth daily.    . predniSONE (STERAPRED UNI-PAK 21 TAB) 10 MG (21) TBPK tablet 6 day taper; take as directed on packaged instructions (Patient not taking: Reported on 09/14/2019) 21 tablet 0  . ramipril (  ALTACE) 10 MG capsule Take 1 capsule (10 mg total) by mouth daily. 30 capsule 2  . sulfamethoxazole-trimethoprim (BACTRIM DS) 800-160 MG tablet Take 1 tablet by mouth 2 (two) times daily. (Patient not taking: Reported on 09/14/2019)    . tamsulosin (FLOMAX) 0.4 MG CAPS capsule Take 0.4 mg by mouth daily.     No facility-administered medications prior to visit.    Allergies  Allergen Reactions  . Codeine Nausea Only and Nausea And Vomiting    Review of Systems  Musculoskeletal: Positive for back pain and myalgias. Negative for arthralgias, gait problem, joint swelling, neck pain and neck stiffness.       Objective:    Physical Exam Constitutional:      General: He is not in acute distress.    Appearance: He is well-developed.  HENT:     Head: Normocephalic and atraumatic.     Right Ear: Hearing and tympanic membrane normal.     Left Ear: Hearing and tympanic membrane normal.     Nose: Nose normal.  Eyes:     General: Lids are normal. No scleral icterus.       Right eye: No discharge.        Left eye: No discharge.     Conjunctiva/sclera: Conjunctivae normal.  Cardiovascular:     Rate and Rhythm: Normal rate and regular rhythm.     Heart sounds: Normal heart sounds.  Pulmonary:     Effort: Pulmonary effort is normal. No respiratory distress.     Breath sounds: Normal breath sounds.  Musculoskeletal:        General: Normal range of motion.     Cervical back: Neck supple.     Comments: Sharp pain/spasm in left lower back muscles to palpation and certain movements. No neurologic deficit.  Skin:     Findings: No lesion or rash.  Neurological:     Mental Status: He is alert and oriented to person, place, and time.  Psychiatric:        Speech: Speech normal.        Behavior: Behavior normal.        Thought Content: Thought content normal.     BP (!) 117/55 (BP Location: Right Arm, Patient Position: Sitting, Cuff Size: Normal)   Pulse 60   Temp (!) 97.4 F (36.3 C) (Oral)   Wt 188 lb (85.3 kg)   SpO2 99%   BMI 28.59 kg/m  Wt Readings from Last 3 Encounters:  12/01/19 188 lb (85.3 kg)  09/14/19 188 lb 3.2 oz (85.4 kg)  04/13/19 185 lb (83.9 kg)    Health Maintenance Due  Topic Date Due  . COVID-19 Vaccine (1) Never done  . HEMOGLOBIN A1C  06/01/2019  . TETANUS/TDAP  09/24/2019  . INFLUENZA VACCINE  10/04/2019  . FOOT EXAM  11/27/2019    There are no preventive care reminders to display for this patient.   Lab Results  Component Value Date   TSH 5.820 (H) 12/02/2018   Lab Results  Component Value Date   WBC 10.0 04/13/2019   HGB 14.2 04/13/2019   HCT 42.9 04/13/2019   MCV 85.1 04/13/2019   PLT 208 04/13/2019   Lab Results  Component Value Date   NA 134 (L) 04/13/2019   K 4.2 04/13/2019   CO2 22 04/13/2019   GLUCOSE 146 (H) 04/13/2019   BUN 19 04/13/2019   CREATININE 1.37 (H) 04/13/2019   BILITOT 0.5 12/02/2018   ALKPHOS 73 12/02/2018   AST 24 12/02/2018  ALT 35 12/02/2018   PROT 6.9 12/02/2018   ALBUMIN 4.1 12/02/2018   CALCIUM 8.9 04/13/2019   ANIONGAP 11 04/13/2019   Lab Results  Component Value Date   CHOL 82 (L) 12/02/2018   Lab Results  Component Value Date   HDL 24 (L) 12/02/2018   Lab Results  Component Value Date   LDLCALC 34 12/02/2018   Lab Results  Component Value Date   TRIG 136 12/02/2018   Lab Results  Component Value Date   CHOLHDL 3.4 12/02/2018   Lab Results  Component Value Date   HGBA1C 6.2 (H) 12/02/2018       Assessment & Plan:   1. Acute left-sided low back pain without sciatica Sharp pains without  radiation or numbness in extremities. No muscle weakness and no history of trauma. Pains occur with movement and transitioning from sitting to standing and vice versa. Tender to palpate the lower left lateral muscles. No bruising or rashes. With history of arthritis in spine and taking Plavix daily, will treat with Baclofen, Prednisone taper and change Voltaren Gel to Salonpas Lidocaine Patch for discomfort. Rest and moist heat applications after patches removed. Recheck if no better in a week to 10 days. - predniSONE (STERAPRED UNI-PAK 21 TAB) 10 MG (21) TBPK tablet; 6 day taper; take as directed on packaged instructions  Dispense: 21 tablet; Refill: 0 - baclofen (LIORESAL) 10 MG tablet; Take 1 tablet (10 mg total) by mouth 3 (three) times daily.  Dispense: 30 each; Refill: 0  2. Essential hypertension Tolerating the Ramipril 10 mg qd, Metoprolol Tartrate 25 mg BID and Amlodipine 5 mg qd. Denies chest pains, dyspnea, palpitations or edema. Recheck labs. - CBC with Differential/Platelet - Comprehensive metabolic panel - Lipid panel - TSH  3. Diabetes mellitus without complication ([place]) No hypoglycemic episodes. Still controlling blood sugar by diet and exercise. Recheck labs and follow up pending reports.  - CBC with Differential/Platelet - Comprehensive metabolic panel - Hemoglobin A1c - Lipid panel  4. Coronary artery disease involving native coronary artery of native heart without angina pectoris Followed by Dr. Clayborn Bigness (cardiologist) and taking Plavix 75 mg qd, Imdur 60 mg qd, Metoprolol Tartrate 25 mg BID and keeps SL NTG available for prn use (no need since PCI with stent placement in October 2020 to the LAD at [facility]. Recheck routine labs. - CBC with Differential/Platelet - Comprehensive metabolic panel - Lipid panel - TSH  5. Mixed hyperlipidemia Tolerating the Atorvastatin 40 mg q hs and Co-Q 10 30 mg qd without side effects. Recheck routine labs. Continue low fat diet. -  Comprehensive metabolic panel - Lipid panel - TSH     No orders of the defined types were placed in this encounter.    Juluis Mire, CMA

## 2019-12-01 NOTE — Patient Instructions (Signed)
Acute Back Pain, Adult Acute back pain is sudden and usually short-lived. It is often caused by an injury to the muscles and tissues in the back. The injury may result from:  A muscle or ligament getting overstretched or torn (strained). Ligaments are tissues that connect bones to each other. Lifting something improperly can cause a back strain.  Wear and tear (degeneration) of the spinal disks. Spinal disks are circular tissue that provides cushioning between the bones of the spine (vertebrae).  Twisting motions, such as while playing sports or doing yard work.  A hit to the back.  Arthritis. You may have a physical exam, lab tests, and imaging tests to find the cause of your pain. Acute back pain usually goes away with rest and home care. Follow these instructions at home: Managing pain, stiffness, and swelling  Take over-the-counter and prescription medicines only as told by your health care provider.  Your health care provider may recommend applying ice during the first 24-48 hours after your pain starts. To do this: ? Put ice in a plastic bag. ? Place a towel between your skin and the bag. ? Leave the ice on for 20 minutes, 2-3 times a day.  If directed, apply heat to the affected area as often as told by your health care provider. Use the heat source that your health care provider recommends, such as a moist heat pack or a heating pad. ? Place a towel between your skin and the heat source. ? Leave the heat on for 20-30 minutes. ? Remove the heat if your skin turns bright red. This is especially important if you are unable to feel pain, heat, or cold. You have a greater risk of getting burned. Activity   Do not stay in bed. Staying in bed for more than 1-2 days can delay your recovery.  Sit up and stand up straight. Avoid leaning forward when you sit, or hunching over when you stand. ? If you work at a desk, sit close to it so you do not need to lean over. Keep your chin tucked  in. Keep your neck drawn back, and keep your elbows bent at a right angle. Your arms should look like the letter "L." ? Sit high and close to the steering wheel when you drive. Add lower back (lumbar) support to your car seat, if needed.  Take short walks on even surfaces as soon as you are able. Try to increase the length of time you walk each day.  Do not sit, drive, or stand in one place for more than 30 minutes at a time. Sitting or standing for long periods of time can put stress on your back.  Do not drive or use heavy machinery while taking prescription pain medicine.  Use proper lifting techniques. When you bend and lift, use positions that put less stress on your back: ? Bend your knees. ? Keep the load close to your body. ? Avoid twisting.  Exercise regularly as told by your health care provider. Exercising helps your back heal faster and helps prevent back injuries by keeping muscles strong and flexible.  Work with a physical therapist to make a safe exercise program, as recommended by your health care provider. Do any exercises as told by your physical therapist. Lifestyle  Maintain a healthy weight. Extra weight puts stress on your back and makes it difficult to have good posture.  Avoid activities or situations that make you feel anxious or stressed. Stress and anxiety increase muscle   tension and can make back pain worse. Learn ways to manage anxiety and stress, such as through exercise. General instructions  Sleep on a firm mattress in a comfortable position. Try lying on your side with your knees slightly bent. If you lie on your back, put a pillow under your knees.  Follow your treatment plan as told by your health care provider. This may include: ? Cognitive or behavioral therapy. ? Acupuncture or massage therapy. ? Meditation or yoga. Contact a health care provider if:  You have pain that is not relieved with rest or medicine.  You have increasing pain going down  into your legs or buttocks.  Your pain does not improve after 2 weeks.  You have pain at night.  You lose weight without trying.  You have a fever or chills. Get help right away if:  You develop new bowel or bladder control problems.  You have unusual weakness or numbness in your arms or legs.  You develop nausea or vomiting.  You develop abdominal pain.  You feel faint. Summary  Acute back pain is sudden and usually short-lived.  Use proper lifting techniques. When you bend and lift, use positions that put less stress on your back.  Take over-the-counter and prescription medicines and apply heat or ice as directed by your health care provider. This information is not intended to replace advice given to you by your health care provider. Make sure you discuss any questions you have with your health care provider. Document Revised: 06/10/2018 Document Reviewed: 10/03/2016 Elsevier Patient Education  2020 [employer]  

## 2019-12-05 LAB — COMPREHENSIVE METABOLIC PANEL
ALT: 19 IU/L (ref 0–44)
AST: 16 IU/L (ref 0–40)
Albumin/Globulin Ratio: 1.5 (ref 1.2–2.2)
Albumin: 4.1 g/dL (ref 3.7–4.7)
Alkaline Phosphatase: 78 IU/L (ref 44–121)
BUN/Creatinine Ratio: 17 (ref 10–24)
BUN: 23 mg/dL (ref 8–27)
Bilirubin Total: 0.3 mg/dL (ref 0.0–1.2)
CO2: 19 mmol/L — ABNORMAL LOW (ref 20–29)
Calcium: 9.1 mg/dL (ref 8.6–10.2)
Chloride: 103 mmol/L (ref 96–106)
Creatinine, Ser: 1.33 mg/dL — ABNORMAL HIGH (ref 0.76–1.27)
GFR calc Af Amer: 60 mL/min/{1.73_m2} (ref >59)
GFR calc non Af Amer: 52 mL/min/{1.73_m2} — ABNORMAL LOW (ref >59)
Globulin, Total: 2.7 g/dL (ref 1.5–4.5)
Glucose: 84 mg/dL (ref 65–99)
Potassium: 4.1 mmol/L (ref 3.5–5.2)
Sodium: 138 mmol/L (ref 134–144)
Total Protein: 6.8 g/dL (ref 6.0–8.5)

## 2019-12-05 LAB — CBC WITH DIFFERENTIAL/PLATELET
Basophils Absolute: 0 10*3/uL (ref 0.0–0.2)
Basos: 0 %
EOS (ABSOLUTE): 0 10*3/uL (ref 0.0–0.4)
Eos: 0 %
Hematocrit: 42.4 % (ref 37.5–51.0)
Hemoglobin: 14.1 g/dL (ref 13.0–17.7)
Immature Grans (Abs): 0 10*3/uL (ref 0.0–0.1)
Immature Granulocytes: 0 %
Lymphocytes Absolute: 3.2 10*3/uL — ABNORMAL HIGH (ref 0.7–3.1)
Lymphs: 24 %
MCH: 29.4 pg (ref 26.6–33.0)
MCHC: 33.3 g/dL (ref 31.5–35.7)
MCV: 89 fL (ref 79–97)
Monocytes Absolute: 1.3 10*3/uL — ABNORMAL HIGH (ref 0.1–0.9)
Monocytes: 10 %
Neutrophils Absolute: 8.9 10*3/uL — ABNORMAL HIGH (ref 1.4–7.0)
Neutrophils: 66 %
Platelets: 186 10*3/uL (ref 150–450)
RBC: 4.79 x10E6/uL (ref 4.14–5.80)
RDW: 14.4 % (ref 11.6–15.4)
WBC: 13.5 10*3/uL — ABNORMAL HIGH (ref 3.4–10.8)

## 2019-12-05 LAB — LIPID PANEL
Chol/HDL Ratio: 3.7 ratio (ref 0.0–5.0)
Cholesterol, Total: 118 mg/dL (ref 100–199)
HDL: 32 mg/dL — ABNORMAL LOW (ref >39)
LDL Chol Calc (NIH): 63 mg/dL (ref 0–99)
Triglycerides: 127 mg/dL (ref 0–149)
VLDL Cholesterol Cal: 23 mg/dL (ref 5–40)

## 2019-12-05 LAB — TSH: TSH: 2.93 u[IU]/mL (ref 0.450–4.500)

## 2019-12-05 LAB — HEMOGLOBIN A1C
Est. average glucose Bld gHb Est-mCnc: 134 mg/dL
Hgb A1c MFr Bld: 6.3 % — ABNORMAL HIGH (ref 4.8–5.6)

## 2019-12-07 ENCOUNTER — Other Ambulatory Visit: Payer: Self-pay

## 2019-12-07 MED ORDER — ATORVASTATIN CALCIUM 80 MG PO TABS: 80.0000 mg | ORAL_TABLET | Freq: Every day | ORAL | 3 refills | Status: DC

## 2019-12-08 ENCOUNTER — Ambulatory Visit (INDEPENDENT_AMBULATORY_CARE_PROVIDER_SITE_OTHER): Payer: Medicare Other | Admitting: Family Medicine

## 2019-12-08 ENCOUNTER — Ambulatory Visit
Admission: RE | Admit: 2019-12-08 | Discharge: 2019-12-08 | Disposition: A | Discharge status: Home / Self Care | Payer: Medicare Other | Attending: Family Medicine | Admitting: Family Medicine

## 2019-12-08 ENCOUNTER — Encounter: Payer: Self-pay | Admitting: Family Medicine

## 2019-12-08 ENCOUNTER — Other Ambulatory Visit: Payer: Self-pay

## 2019-12-08 ENCOUNTER — Ambulatory Visit
Admission: RE | Admit: 2019-12-08 | Discharge: 2019-12-08 | Disposition: A | Discharge status: Home / Self Care | Payer: Medicare Other | Source: Ambulatory Visit | Attending: Family Medicine | Admitting: Family Medicine

## 2019-12-08 VITALS — BP 153/65 | HR 65 | Temp 98.2°F

## 2019-12-08 DIAGNOSIS — M545 Low back pain, unspecified: Secondary | ICD-10-CM | POA: Insufficient documentation

## 2019-12-08 DIAGNOSIS — D72829 Elevated white blood cell count, unspecified: Secondary | ICD-10-CM | POA: Diagnosis not present

## 2019-12-08 DIAGNOSIS — G8929 Other chronic pain: Secondary | ICD-10-CM

## 2019-12-08 LAB — POCT URINALYSIS DIPSTICK
Bilirubin, UA: NEGATIVE
Blood, UA: NEGATIVE
Glucose, UA: NEGATIVE
Ketones, UA: NEGATIVE
Protein, UA: NEGATIVE
Spec Grav, UA: 1.02 (ref 1.010–1.025)
Urobilinogen, UA: 0.2 E.U./dL
pH, UA: 6 (ref 5.0–8.0)

## 2019-12-08 MED ORDER — SULFAMETHOXAZOLE-TRIMETHOPRIM 800-160 MG PO TABS: 1.0000 | ORAL_TABLET | Freq: Two times a day (BID) | ORAL | 0 refills | Status: DC

## 2019-12-08 MED ORDER — METHOCARBAMOL 500 MG PO TABS: 500.0000 mg | ORAL_TABLET | Freq: Four times a day (QID) | ORAL | 0 refills | Status: DC | PRN

## 2019-12-08 NOTE — Progress Notes (Signed)
Acute Office Visit  Subjective:    Patient ID: Luke Rojas, male    DOB: 1945/03/16, 74 y.o.   MRN: [id number]  No chief complaint on file.    HPI Patient is in today for elevated white count found on recent labs.  Patient was told to have a urine specimen checked to see if that might be the source of the elevated WBC.  Patient was unable to obtain a urine specimen.  Patient also complains of continued back pain. He states that he does not feel he has improved any.  Past Medical History:  Diagnosis Date  . Chronic kidney disease    kidney stones  . Coronary artery disease    2 stents   . Diabetes mellitus without complication ([place])   . GERD (gastroesophageal reflux disease)   . Hypercholesteremia   . Hypertension   . Myocardial infarction [facility]) 2013    Past Surgical History:  Procedure Laterality Date  . CARDIAC CATHETERIZATION  2013   2 stents  . COLONOSCOPY  04/27/05  . COLONOSCOPY WITH PROPOFOL N/A 06/08/2015   Procedure: COLONOSCOPY WITH PROPOFOL;  Surgeon: Robert Bellow, MD;  Location: [facility] ENDOSCOPY;  Service: Endoscopy;  Laterality: N/A;  . CYSTOSCOPY WITH STENT PLACEMENT Right 09/16/2016   Procedure: CYSTOSCOPY WITH STENT PLACEMENT;  Surgeon: Royston Cowper, MD;  Location: [facility] ORS;  Service: Urology;  Laterality: Right;  . EXTRACORPOREAL SHOCK WAVE LITHOTRIPSY Left 11/04/2014   has had 2 previous lithotripsies  . EXTRACORPOREAL SHOCK WAVE LITHOTRIPSY Left 03/24/2015   Procedure: EXTRACORPOREAL SHOCK WAVE LITHOTRIPSY (ESWL);  Surgeon: Royston Cowper, MD;  Location: [facility] ORS;  Service: Urology;  Laterality: Left;  . EXTRACORPOREAL SHOCK WAVE LITHOTRIPSY Right 10/04/2016   Procedure: EXTRACORPOREAL SHOCK WAVE LITHOTRIPSY (ESWL);  Surgeon: Royston Cowper, MD;  Location: [facility] ORS;  Service: Urology;  Laterality: Right;  . LEFT HEART CATH AND CORONARY ANGIOGRAPHY N/A 12/05/2018   Procedure: LEFT HEART CATH AND CORONARY ANGIOGRAPHY with possible pci and stent;   Surgeon: Yolonda Kida, MD;  Location: [place] CV LAB;  Service: Cardiovascular;  Laterality: N/A;    Family History  Problem Relation Age of Onset  . Pulmonary embolism Mother   . Transient ischemic attack Mother   . Diabetes Mother   . Pancreatic cancer Father   . Hypertension Father   . Diabetes Father   . Cirrhosis Brother     Social History   Socioeconomic History  . Marital status: Married    Spouse name: Not on file  . Number of children: 2  . Years of education: Not on file  . Highest education level: 8th grade  Occupational History  . Occupation: retired  Tobacco Use  . Smoking status: Former Smoker    Packs/day: 1.00    Years: 30.00    Pack years: 30.00    Types: Cigarettes    Quit date: 03/05/1984    Years since quitting: 35.7  . Smokeless tobacco: Current User    Types: Chew  Vaping Use  . Vaping Use: Never used  Substance and Sexual Activity  . Alcohol use: No    Alcohol/week: 0.0 standard drinks  . Drug use: No  . Sexual activity: Not on file  Other Topics Concern  . Not on file  Social History Narrative  . Not on file   Social Determinants of Health   Financial Resource Strain: Low Risk   . Difficulty of Paying Living Expenses: Not hard at all  Food Insecurity:  No Food Insecurity  . Worried About Charity fundraiser in the Last Year: Never true  . Ran Out of Food in the Last Year: Never true  Transportation Needs: No Transportation Needs  . Lack of Transportation (Medical): No  . Lack of Transportation (Non-Medical): No  Physical Activity: Insufficiently Active  . Days of Exercise per Week: 2 days  . Minutes of Exercise per Session: 30 min  Stress: No Stress Concern Present  . Feeling of Stress : Not at all  Social Connections: Moderately Integrated  . Frequency of Communication with Friends and Family: More than three times a week  . Frequency of Social Gatherings with Friends and Family: More than three times a week  . Attends  Religious Services: More than 4 times per year  . Active Member of Clubs or Organizations: No  . Attends Archivist Meetings: Never  . Marital Status: Married  Human resources officer Violence: Not At Risk  . Fear of Current or Ex-Partner: No  . Emotionally Abused: No  . Physically Abused: No  . Sexually Abused: No    Outpatient Medications Prior to Visit  Medication Sig Dispense Refill  . amLODipine (NORVASC) 5 MG tablet Take 5 mg by mouth daily.     Marland Kitchen atorvastatin (LIPITOR) 80 MG tablet Take 1 tablet (80 mg total) by mouth at bedtime. 90 tablet 3  . baclofen (LIORESAL) 10 MG tablet Take 1 tablet (10 mg total) by mouth 3 (three) times daily. 30 each 0  . co-enzyme Q-10 30 MG capsule Take 30 mg by mouth daily.     . diclofenac sodium (VOLTAREN) 1 % GEL Apply 2 g topically 4 (four) times daily. 100 g 3  . finasteride (PROSCAR) 5 MG tablet Take 5 mg by mouth daily.  (Patient not taking: Reported on 12/08/2019)    . isosorbide mononitrate (IMDUR) 60 MG 24 hr tablet Take 60 mg by mouth daily. (Patient not taking: Reported on 12/08/2019)    . Krill Oil 1000 MG CAPS Take 1 mg by mouth daily. (Patient not taking: Reported on 12/08/2019)    . metoprolol tartrate (LOPRESSOR) 25 MG tablet Take 1 tablet (25 mg total) by mouth 2 (two) times daily. 60 tablet 2  . MULTIPLE VITAMIN PO Take 1 tablet by mouth daily.    . nitroGLYCERIN (NITROSTAT) 0.4 MG SL tablet Place 1 tablet (0.4 mg total) under the tongue every 5 (five) minutes as needed for chest pain. 30 tablet 1  . Omega-3 Fatty Acids (FISH OIL) 1200 MG CAPS Take 1 capsule by mouth daily.     . pantoprazole (PROTONIX) 40 MG tablet Take 40 mg by mouth daily.    . predniSONE (STERAPRED UNI-PAK 21 TAB) 10 MG (21) TBPK tablet 6 day taper; take as directed on packaged instructions (Patient not taking: Reported on 12/08/2019) 21 tablet 0  . ramipril (ALTACE) 10 MG capsule Take 1 capsule (10 mg total) by mouth daily. 30 capsule 2  . tamsulosin (FLOMAX) 0.4  MG CAPS capsule Take 0.4 mg by mouth daily.     No facility-administered medications prior to visit.    Allergies  Allergen Reactions  . Codeine Nausea Only and Nausea And Vomiting    Review of Systems  Genitourinary: Positive for decreased urine volume. Negative for dysuria, flank pain, frequency, hematuria and urgency.       Nocturia   Musculoskeletal: Positive for back pain.      Objective:    Physical Exam Constitutional:  General: He is not in acute distress.    Appearance: He is well-developed.  HENT:     Head: Normocephalic and atraumatic.     Right Ear: Hearing normal.     Left Ear: Hearing normal.     Nose: Nose normal.  Eyes:     General: Lids are normal. No scleral icterus.       Right eye: No discharge.        Left eye: No discharge.     Conjunctiva/sclera: Conjunctivae normal.  Cardiovascular:     Rate and Rhythm: Normal rate and regular rhythm.     Heart sounds: Normal heart sounds.  Pulmonary:     Effort: Pulmonary effort is normal. No respiratory distress.     Breath sounds: Normal breath sounds.  Abdominal:     General: Bowel sounds are normal.     Palpations: Abdomen is soft.  Musculoskeletal:        General: Tenderness present. Normal range of motion.     Comments: Pain in the left lower back and hip. No radiation to legs or nnumbness.  Skin:    Findings: No lesion or rash.  Neurological:     Mental Status: He is alert and oriented to person, place, and time.     Sensory: No sensory deficit.     Motor: No weakness.  Psychiatric:        Speech: Speech normal.        Behavior: Behavior normal.        Thought Content: Thought content normal.     BP (!) 153/65 (BP Location: Left Arm, Patient Position: Sitting, Cuff Size: Normal)   Pulse 65   Temp 98.2 F (36.8 C) (Oral)   SpO2 98%  Wt Readings from Last 3 Encounters:  12/01/19 188 lb (85.3 kg)  09/14/19 188 lb 3.2 oz (85.4 kg)  04/13/19 185 lb (83.9 kg)    Health Maintenance Due    Topic Date Due  . COVID-19 Vaccine (1) Never done  . TETANUS/TDAP  09/24/2019  . INFLUENZA VACCINE  10/04/2019  . FOOT EXAM  11/27/2019    There are no preventive care reminders to display for this patient.   Lab Results  Component Value Date   TSH 2.930 12/04/2019   Lab Results  Component Value Date   WBC 13.5 (H) 12/04/2019   HGB 14.1 12/04/2019   HCT 42.4 12/04/2019   MCV 89 12/04/2019   PLT 186 12/04/2019   Lab Results  Component Value Date   NA 138 12/04/2019   K 4.1 12/04/2019   CO2 19 (L) 12/04/2019   GLUCOSE 84 12/04/2019   BUN 23 12/04/2019   CREATININE 1.33 (H) 12/04/2019   BILITOT 0.3 12/04/2019   ALKPHOS 78 12/04/2019   AST 16 12/04/2019   ALT 19 12/04/2019   PROT 6.8 12/04/2019   ALBUMIN 4.1 12/04/2019   CALCIUM 9.1 12/04/2019   ANIONGAP 11 04/13/2019   Lab Results  Component Value Date   CHOL 118 12/04/2019   Lab Results  Component Value Date   HDL 32 (L) 12/04/2019   Lab Results  Component Value Date   LDLCALC 63 12/04/2019   Lab Results  Component Value Date   TRIG 127 12/04/2019   Lab Results  Component Value Date   CHOLHDL 3.7 12/04/2019   Lab Results  Component Value Date   HGBA1C 6.3 (H) 12/04/2019       Assessment & Plan:   1. Chronic left-sided low back pain without  sciatica Persistent pain in the left lower back without radiation to legs or numbness over the past 2-3 weeks. May use Salonpas patches or Voltaren Gel to the back and add Robaxin since the Baclofen afforded no relief. Will get x-ray evaluation for suspected degenerative arthritis. - DG Lumbar Spine Complete - methocarbamol (ROBAXIN) 500 MG tablet; Take 1 tablet (500 mg total) by mouth every 6 (six) hours as needed for muscle spasms.  Dispense: 30 tablet; Refill: 0  2. Leukocytosis, unspecified type CBC showed WBC count of 13,500 on 12-04-19. Urinalysis today showed 4+ leukocytes. Will treat with antibiotic and get a C&S to isolate the pathogen. Recheck  pending report. - POCT urinalysis dipstick - Urine Culture - sulfamethoxazole-trimethoprim (BACTRIM DS) 800-160 MG tablet; Take 1 tablet by mouth 2 (two) times daily.  Dispense: 20 tablet; Refill: 0    No orders of the defined types were placed in this encounter.    Juluis Mire, CMA

## 2019-12-09 ENCOUNTER — Telehealth: Payer: Self-pay

## 2019-12-09 NOTE — Telephone Encounter (Signed)
Copied from [place] [id number]. Topic: General - Other >> Dec 08, 2019  4:19 PM Antonieta Iba C wrote: Reason for CRM: Gerald Stabs with pharmacy called in for assistance. Pt was told that a Rx would be sent to pharmacy, pharmacy is stating that they have not received it yet,   Please assist.

## 2019-12-10 NOTE — Telephone Encounter (Signed)
Tried calling pharmacy multiple times continuously for 2 minutes. Line busy. Will try calling again later.

## 2019-12-10 NOTE — Telephone Encounter (Signed)
I called and spoke with pharmacist Gerald Stabs. He confirmed that he received prescription.

## 2019-12-10 NOTE — Telephone Encounter (Signed)
Chart indicates Methocarbamol was received at the [facility] at 4:42 pm on 12-08-19. Check to see if it really got there. If not, please give a verbal authorization.

## 2019-12-12 LAB — URINE CULTURE

## 2019-12-14 ENCOUNTER — Telehealth: Payer: Self-pay

## 2019-12-14 NOTE — Telephone Encounter (Signed)
Copied from [place] [id number]. Topic: General - Other >> Dec 11, 2019  2:17 PM Luke Rojas wrote: Reason for CRM: Pt called and is requesting to have a call back nurse regarding his imaging results. Please advise.

## 2019-12-15 ENCOUNTER — Encounter: Payer: Self-pay | Admitting: Family Medicine

## 2019-12-15 ENCOUNTER — Ambulatory Visit (INDEPENDENT_AMBULATORY_CARE_PROVIDER_SITE_OTHER): Payer: Medicare Other | Admitting: Family Medicine

## 2019-12-15 ENCOUNTER — Other Ambulatory Visit: Payer: Self-pay

## 2019-12-15 VITALS — BP 134/54 | HR 67 | Temp 98.6°F

## 2019-12-15 DIAGNOSIS — A499 Bacterial infection, unspecified: Secondary | ICD-10-CM | POA: Diagnosis not present

## 2019-12-15 DIAGNOSIS — M545 Low back pain, unspecified: Secondary | ICD-10-CM | POA: Diagnosis not present

## 2019-12-15 DIAGNOSIS — N390 Urinary tract infection, site not specified: Secondary | ICD-10-CM

## 2019-12-15 NOTE — Progress Notes (Signed)
Acute Office Visit  Subjective:    Patient ID: Luke Rojas, male    DOB: 02/13/1946, 74 y.o.   MRN: [id number]  No chief complaint on file.   HPI Patient is in today to discuss getting a permanent handicap card.  He also wants to discuss long term care of his back.  He states he is feeling better but wants to know what to expect in the future.  Past Medical History:  Diagnosis Date  . Chronic kidney disease    kidney stones  . Coronary artery disease    2 stents   . Diabetes mellitus without complication ([place])   . GERD (gastroesophageal reflux disease)   . Hypercholesteremia   . Hypertension   . Myocardial infarction [facility]) 2013    Past Surgical History:  Procedure Laterality Date  . CARDIAC CATHETERIZATION  2013   2 stents  . COLONOSCOPY  04/27/05  . COLONOSCOPY WITH PROPOFOL N/A 06/08/2015   Procedure: COLONOSCOPY WITH PROPOFOL;  Surgeon: Robert Bellow, MD;  Location: [facility] ENDOSCOPY;  Service: Endoscopy;  Laterality: N/A;  . CYSTOSCOPY WITH STENT PLACEMENT Right 09/16/2016   Procedure: CYSTOSCOPY WITH STENT PLACEMENT;  Surgeon: Royston Cowper, MD;  Location: [facility] ORS;  Service: Urology;  Laterality: Right;  . EXTRACORPOREAL SHOCK WAVE LITHOTRIPSY Left 11/04/2014   has had 2 previous lithotripsies  . EXTRACORPOREAL SHOCK WAVE LITHOTRIPSY Left 03/24/2015   Procedure: EXTRACORPOREAL SHOCK WAVE LITHOTRIPSY (ESWL);  Surgeon: Royston Cowper, MD;  Location: [facility] ORS;  Service: Urology;  Laterality: Left;  . EXTRACORPOREAL SHOCK WAVE LITHOTRIPSY Right 10/04/2016   Procedure: EXTRACORPOREAL SHOCK WAVE LITHOTRIPSY (ESWL);  Surgeon: Royston Cowper, MD;  Location: [facility] ORS;  Service: Urology;  Laterality: Right;  . LEFT HEART CATH AND CORONARY ANGIOGRAPHY N/A 12/05/2018   Procedure: LEFT HEART CATH AND CORONARY ANGIOGRAPHY with possible pci and stent;  Surgeon: Yolonda Kida, MD;  Location: [place] CV LAB;  Service: Cardiovascular;  Laterality: N/A;    Family  History  Problem Relation Age of Onset  . Pulmonary embolism Mother   . Transient ischemic attack Mother   . Diabetes Mother   . Pancreatic cancer Father   . Hypertension Father   . Diabetes Father   . Cirrhosis Brother     Social History   Socioeconomic History  . Marital status: Married    Spouse name: Not on file  . Number of children: 2  . Years of education: Not on file  . Highest education level: 8th grade  Occupational History  . Occupation: retired  Tobacco Use  . Smoking status: Former Smoker    Packs/day: 1.00    Years: 30.00    Pack years: 30.00    Types: Cigarettes    Quit date: 03/05/1984    Years since quitting: 35.8  . Smokeless tobacco: Current User    Types: Chew  Vaping Use  . Vaping Use: Never used  Substance and Sexual Activity  . Alcohol use: No    Alcohol/week: 0.0 standard drinks  . Drug use: No  . Sexual activity: Not on file  Other Topics Concern  . Not on file  Social History Narrative  . Not on file   Social Determinants of Health   Financial Resource Strain: Low Risk   . Difficulty of Paying Living Expenses: Not hard at all  Food Insecurity: No Food Insecurity  . Worried About Charity fundraiser in the Last Year: Never true  . Ran Out of Food  in the Last Year: Never true  Transportation Needs: No Transportation Needs  . Lack of Transportation (Medical): No  . Lack of Transportation (Non-Medical): No  Physical Activity: Insufficiently Active  . Days of Exercise per Week: 2 days  . Minutes of Exercise per Session: 30 min  Stress: No Stress Concern Present  . Feeling of Stress : Not at all  Social Connections: Moderately Integrated  . Frequency of Communication with Friends and Family: More than three times a week  . Frequency of Social Gatherings with Friends and Family: More than three times a week  . Attends Religious Services: More than 4 times per year  . Active Member of Clubs or Organizations: No  . Attends Theatre manager Meetings: Never  . Marital Status: Married  Human resources officer Violence: Not At Risk  . Fear of Current or Ex-Partner: No  . Emotionally Abused: No  . Physically Abused: No  . Sexually Abused: No    Outpatient Medications Prior to Visit  Medication Sig Dispense Refill  . atorvastatin (LIPITOR) 80 MG tablet Take 1 tablet (80 mg total) by mouth at bedtime. 90 tablet 3  . baclofen (LIORESAL) 10 MG tablet Take 1 tablet (10 mg total) by mouth 3 (three) times daily. 30 each 0  . co-enzyme Q-10 30 MG capsule Take 30 mg by mouth daily.     . diclofenac sodium (VOLTAREN) 1 % GEL Apply 2 g topically 4 (four) times daily. 100 g 3  . methocarbamol (ROBAXIN) 500 MG tablet Take 1 tablet (500 mg total) by mouth every 6 (six) hours as needed for muscle spasms. 30 tablet 0  . metoprolol tartrate (LOPRESSOR) 25 MG tablet Take 1 tablet (25 mg total) by mouth 2 (two) times daily. 60 tablet 2  . MULTIPLE VITAMIN PO Take 1 tablet by mouth daily.    . nitroGLYCERIN (NITROSTAT) 0.4 MG SL tablet Place 1 tablet (0.4 mg total) under the tongue every 5 (five) minutes as needed for chest pain. 30 tablet 1  . Omega-3 Fatty Acids (FISH OIL) 1200 MG CAPS Take 1 capsule by mouth daily.     . pantoprazole (PROTONIX) 40 MG tablet Take 40 mg by mouth daily.    . ramipril (ALTACE) 10 MG capsule Take 1 capsule (10 mg total) by mouth daily. 30 capsule 2  . sulfamethoxazole-trimethoprim (BACTRIM DS) 800-160 MG tablet Take 1 tablet by mouth 2 (two) times daily. 20 tablet 0  . tamsulosin (FLOMAX) 0.4 MG CAPS capsule Take 0.4 mg by mouth daily.    Marland Kitchen amLODipine (NORVASC) 5 MG tablet Take 5 mg by mouth daily.     . finasteride (PROSCAR) 5 MG tablet Take 5 mg by mouth daily.  (Patient not taking: Reported on 12/08/2019)    . isosorbide mononitrate (IMDUR) 60 MG 24 hr tablet Take 60 mg by mouth daily. (Patient not taking: Reported on 12/08/2019)    . Krill Oil 1000 MG CAPS Take 1 mg by mouth daily. (Patient not taking:  Reported on 12/08/2019)    . predniSONE (STERAPRED UNI-PAK 21 TAB) 10 MG (21) TBPK tablet 6 day taper; take as directed on packaged instructions (Patient not taking: Reported on 12/08/2019) 21 tablet 0   No facility-administered medications prior to visit.    Allergies  Allergen Reactions  . Codeine Nausea Only and Nausea And Vomiting    Review of Systems  Musculoskeletal: Positive for back pain (mild).      Objective:    Physical Exam Constitutional:  General: He is not in acute distress.    Appearance: He is well-developed.  HENT:     Head: Normocephalic and atraumatic.     Right Ear: Hearing normal.     Left Ear: Hearing normal.     Nose: Nose normal.  Eyes:     General: Lids are normal. No scleral icterus.       Right eye: No discharge.        Left eye: No discharge.     Conjunctiva/sclera: Conjunctivae normal.  Cardiovascular:     Rate and Rhythm: Normal rate and regular rhythm.     Heart sounds: Normal heart sounds.  Pulmonary:     Effort: Pulmonary effort is normal. No respiratory distress.  Abdominal:     General: Bowel sounds are normal.     Palpations: Abdomen is soft.     Tenderness: There is no abdominal tenderness. There is no right CVA tenderness or left CVA tenderness.  Musculoskeletal:        General: Normal range of motion.     Cervical back: Neck supple.     Comments: No tenderness to palpate back or test SLR's. DTR's symmetric without weakness.  Skin:    Findings: No lesion or rash.  Neurological:     Mental Status: He is alert and oriented to person, place, and time.  Psychiatric:        Speech: Speech normal.        Behavior: Behavior normal.        Thought Content: Thought content normal.     BP (!) 134/54 (BP Location: Right Arm, Patient Position: Sitting, Cuff Size: Normal)   Pulse 67   Temp 98.6 F (37 C) (Oral)   SpO2 99%  Wt Readings from Last 3 Encounters:  12/01/19 188 lb (85.3 kg)  09/14/19 188 lb 3.2 oz (85.4 kg)    04/13/19 185 lb (83.9 kg)    Health Maintenance Due  Topic Date Due  . COVID-19 Vaccine (1) Never done  . TETANUS/TDAP  09/24/2019  . INFLUENZA VACCINE  10/04/2019  . FOOT EXAM  11/27/2019    There are no preventive care reminders to display for this patient.   Lab Results  Component Value Date   TSH 2.930 12/04/2019   Lab Results  Component Value Date   WBC 13.5 (H) 12/04/2019   HGB 14.1 12/04/2019   HCT 42.4 12/04/2019   MCV 89 12/04/2019   PLT 186 12/04/2019   Lab Results  Component Value Date   NA 138 12/04/2019   K 4.1 12/04/2019   CO2 19 (L) 12/04/2019   GLUCOSE 84 12/04/2019   BUN 23 12/04/2019   CREATININE 1.33 (H) 12/04/2019   BILITOT 0.3 12/04/2019   ALKPHOS 78 12/04/2019   AST 16 12/04/2019   ALT 19 12/04/2019   PROT 6.8 12/04/2019   ALBUMIN 4.1 12/04/2019   CALCIUM 9.1 12/04/2019   ANIONGAP 11 04/13/2019   Lab Results  Component Value Date   CHOL 118 12/04/2019   Lab Results  Component Value Date   HDL 32 (L) 12/04/2019   Lab Results  Component Value Date   LDLCALC 63 12/04/2019   Lab Results  Component Value Date   TRIG 127 12/04/2019   Lab Results  Component Value Date   CHOLHDL 3.7 12/04/2019   Lab Results  Component Value Date   HGBA1C 6.3 (H) 12/04/2019       Assessment & Plan:   1. UTI (urinary tract infection), bacterial Improved  discomfort and no fever. Urine C&S isolated E.coli sensitive to the sulfa antibiotic given. Increase fluid intake and finish all the antibiotic   2. Acute left-sided low back pain without sciatica Feeling better with use of Voltaren Gel, Salonpas patches. Robaxin and moist heat applications. X-rays on 12-08-19 confirmed arthritic/degnerative changes of lumbar spine. Continue present treatment and limit strenuous activities. Recheck prn.    No orders of the defined types were placed in this encounter.  Andres Shad, PA, have reviewed all documentation for this visit. The documentation  on 12/15/19 for the exam, diagnosis, procedures, and orders are all accurate and complete.   Juluis Mire, CMA

## 2020-01-18 ENCOUNTER — Ambulatory Visit (INDEPENDENT_AMBULATORY_CARE_PROVIDER_SITE_OTHER): Payer: Medicare Other | Admitting: Family Medicine

## 2020-01-18 ENCOUNTER — Encounter: Payer: Self-pay | Admitting: Family Medicine

## 2020-01-18 ENCOUNTER — Other Ambulatory Visit: Payer: Self-pay

## 2020-01-18 VITALS — BP 143/53 | HR 53 | Temp 98.3°F

## 2020-01-18 DIAGNOSIS — L570 Actinic keratosis: Secondary | ICD-10-CM | POA: Diagnosis not present

## 2020-01-18 NOTE — Progress Notes (Signed)
Acute Office Visit  Subjective:    Patient ID: Luke Rojas, male    DOB: December 13, 1945, 74 y.o.   MRN: [id number]  No chief complaint on file.   HPI Patient is in today for evaluation and possible removal of what patient states is a mole on the top of his head. Patient states that it has been present for a long time.  Past Medical History:  Diagnosis Date  . Chronic kidney disease    kidney stones  . Coronary artery disease    2 stents   . Diabetes mellitus without complication ([place])   . GERD (gastroesophageal reflux disease)   . Hypercholesteremia   . Hypertension   . Myocardial infarction [facility]) 2013    Past Surgical History:  Procedure Laterality Date  . CARDIAC CATHETERIZATION  2013   2 stents  . COLONOSCOPY  04/27/05  . COLONOSCOPY WITH PROPOFOL N/A 06/08/2015   Procedure: COLONOSCOPY WITH PROPOFOL;  Surgeon: Robert Bellow, MD;  Location: [facility] ENDOSCOPY;  Service: Endoscopy;  Laterality: N/A;  . CYSTOSCOPY WITH STENT PLACEMENT Right 09/16/2016   Procedure: CYSTOSCOPY WITH STENT PLACEMENT;  Surgeon: Royston Cowper, MD;  Location: [facility] ORS;  Service: Urology;  Laterality: Right;  . EXTRACORPOREAL SHOCK WAVE LITHOTRIPSY Left 11/04/2014   has had 2 previous lithotripsies  . EXTRACORPOREAL SHOCK WAVE LITHOTRIPSY Left 03/24/2015   Procedure: EXTRACORPOREAL SHOCK WAVE LITHOTRIPSY (ESWL);  Surgeon: Royston Cowper, MD;  Location: [facility] ORS;  Service: Urology;  Laterality: Left;  . EXTRACORPOREAL SHOCK WAVE LITHOTRIPSY Right 10/04/2016   Procedure: EXTRACORPOREAL SHOCK WAVE LITHOTRIPSY (ESWL);  Surgeon: Royston Cowper, MD;  Location: [facility] ORS;  Service: Urology;  Laterality: Right;  . LEFT HEART CATH AND CORONARY ANGIOGRAPHY N/A 12/05/2018   Procedure: LEFT HEART CATH AND CORONARY ANGIOGRAPHY with possible pci and stent;  Surgeon: Yolonda Kida, MD;  Location: [place] CV LAB;  Service: Cardiovascular;  Laterality: N/A;    Family History  Problem Relation Age of  Onset  . Pulmonary embolism Mother   . Transient ischemic attack Mother   . Diabetes Mother   . Pancreatic cancer Father   . Hypertension Father   . Diabetes Father   . Cirrhosis Brother     Social History   Socioeconomic History  . Marital status: Married    Spouse name: Not on file  . Number of children: 2  . Years of education: Not on file  . Highest education level: 8th grade  Occupational History  . Occupation: retired  Tobacco Use  . Smoking status: Former Smoker    Packs/day: 1.00    Years: 30.00    Pack years: 30.00    Types: Cigarettes    Quit date: 03/05/1984    Years since quitting: 35.8  . Smokeless tobacco: Current User    Types: Chew  Vaping Use  . Vaping Use: Never used  Substance and Sexual Activity  . Alcohol use: No    Alcohol/week: 0.0 standard drinks  . Drug use: No  . Sexual activity: Not on file  Other Topics Concern  . Not on file  Social History Narrative  . Not on file   Social Determinants of Health   Financial Resource Strain: Low Risk   . Difficulty of Paying Living Expenses: Not hard at all  Food Insecurity: No Food Insecurity  . Worried About Charity fundraiser in the Last Year: Never true  . Ran Out of Food in the Last Year: Never true  Transportation Needs: No Transportation Needs  . Lack of Transportation (Medical): No  . Lack of Transportation (Non-Medical): No  Physical Activity: Insufficiently Active  . Days of Exercise per Week: 2 days  . Minutes of Exercise per Session: 30 min  Stress: No Stress Concern Present  . Feeling of Stress : Not at all  Social Connections: Moderately Integrated  . Frequency of Communication with Friends and Family: More than three times a week  . Frequency of Social Gatherings with Friends and Family: More than three times a week  . Attends Religious Services: More than 4 times per year  . Active Member of Clubs or Organizations: No  . Attends Archivist Meetings: Never  . Marital  Status: Married  Human resources officer Violence: Not At Risk  . Fear of Current or Ex-Partner: No  . Emotionally Abused: No  . Physically Abused: No  . Sexually Abused: No    Outpatient Medications Prior to Visit  Medication Sig Dispense Refill  . amLODipine (NORVASC) 5 MG tablet Take 5 mg by mouth daily.     Marland Kitchen atorvastatin (LIPITOR) 80 MG tablet Take 1 tablet (80 mg total) by mouth at bedtime. 90 tablet 3  . co-enzyme Q-10 30 MG capsule Take 30 mg by mouth daily.     . diclofenac sodium (VOLTAREN) 1 % GEL Apply 2 g topically 4 (four) times daily. 100 g 3  . Krill Oil 1000 MG CAPS Take 1 mg by mouth daily.     . metoprolol tartrate (LOPRESSOR) 25 MG tablet Take 1 tablet (25 mg total) by mouth 2 (two) times daily. 60 tablet 2  . nitroGLYCERIN (NITROSTAT) 0.4 MG SL tablet Place 1 tablet (0.4 mg total) under the tongue every 5 (five) minutes as needed for chest pain. 30 tablet 1  . Omega-3 Fatty Acids (FISH OIL) 1200 MG CAPS Take 1 capsule by mouth daily.     . pantoprazole (PROTONIX) 40 MG tablet Take 40 mg by mouth daily.    . ramipril (ALTACE) 10 MG capsule Take 1 capsule (10 mg total) by mouth daily. 30 capsule 2  . tamsulosin (FLOMAX) 0.4 MG CAPS capsule Take 0.4 mg by mouth daily.    . baclofen (LIORESAL) 10 MG tablet Take 1 tablet (10 mg total) by mouth 3 (three) times daily. (Patient not taking: Reported on 01/18/2020) 30 each 0  . finasteride (PROSCAR) 5 MG tablet Take 5 mg by mouth daily.  (Patient not taking: Reported on 12/08/2019)    . isosorbide mononitrate (IMDUR) 60 MG 24 hr tablet Take 60 mg by mouth daily. (Patient not taking: Reported on 12/08/2019)    . methocarbamol (ROBAXIN) 500 MG tablet Take 1 tablet (500 mg total) by mouth every 6 (six) hours as needed for muscle spasms. (Patient not taking: Reported on 01/18/2020) 30 tablet 0  . MULTIPLE VITAMIN PO Take 1 tablet by mouth daily. (Patient not taking: Reported on 01/18/2020)    . predniSONE (STERAPRED UNI-PAK 21 TAB) 10 MG (21)  TBPK tablet 6 day taper; take as directed on packaged instructions (Patient not taking: Reported on 12/08/2019) 21 tablet 0  . sulfamethoxazole-trimethoprim (BACTRIM DS) 800-160 MG tablet Take 1 tablet by mouth 2 (two) times daily. (Patient not taking: Reported on 01/18/2020) 20 tablet 0   No facility-administered medications prior to visit.    Allergies  Allergen Reactions  . Codeine Nausea Only and Nausea And Vomiting    Review of Systems  Skin: Negative for color change, pallor, rash and  wound.       Objective:    Physical Exam Constitutional:      General: He is not in acute distress.    Appearance: He is well-developed.  HENT:     Head: Normocephalic and atraumatic.     Right Ear: Hearing normal.     Left Ear: Hearing normal.     Nose: Nose normal.  Eyes:     General: Lids are normal. No scleral icterus.       Right eye: No discharge.        Left eye: No discharge.     Conjunctiva/sclera: Conjunctivae normal.  Pulmonary:     Effort: Pulmonary effort is normal. No respiratory distress.  Musculoskeletal:        General: Normal range of motion.  Skin:    Findings: Lesion present. No rash.     Comments: 2.5 mm raised white keratotic lesion on scalp near the coronal and sagittal intersection. Flat rough flesh colored lesion approximately 2 mm diameter 2 cm to the left of the first lesion.   Neurological:     Mental Status: He is alert and oriented to person, place, and time.  Psychiatric:        Speech: Speech normal.        Behavior: Behavior normal.        Thought Content: Thought content normal.     BP (!) 143/53 (BP Location: Right Arm, Patient Position: Sitting, Cuff Size: Normal)   Pulse (!) 53   Temp 98.3 F (36.8 C) (Oral)   SpO2 100%  Wt Readings from Last 3 Encounters:  12/01/19 188 lb (85.3 kg)  09/14/19 188 lb 3.2 oz (85.4 kg)  04/13/19 185 lb (83.9 kg)    Health Maintenance Due  Topic Date Due  . COVID-19 Vaccine (1) Never done  . TETANUS/TDAP   09/24/2019  . INFLUENZA VACCINE  10/04/2019  . FOOT EXAM  11/27/2019    There are no preventive care reminders to display for this patient.   Lab Results  Component Value Date   TSH 2.930 12/04/2019   Lab Results  Component Value Date   WBC 13.5 (H) 12/04/2019   HGB 14.1 12/04/2019   HCT 42.4 12/04/2019   MCV 89 12/04/2019   PLT 186 12/04/2019   Lab Results  Component Value Date   NA 138 12/04/2019   K 4.1 12/04/2019   CO2 19 (L) 12/04/2019   GLUCOSE 84 12/04/2019   BUN 23 12/04/2019   CREATININE 1.33 (H) 12/04/2019   BILITOT 0.3 12/04/2019   ALKPHOS 78 12/04/2019   AST 16 12/04/2019   ALT 19 12/04/2019   PROT 6.8 12/04/2019   ALBUMIN 4.1 12/04/2019   CALCIUM 9.1 12/04/2019   ANIONGAP 11 04/13/2019   Lab Results  Component Value Date   CHOL 118 12/04/2019   Lab Results  Component Value Date   HDL 32 (L) 12/04/2019   Lab Results  Component Value Date   LDLCALC 63 12/04/2019   Lab Results  Component Value Date   TRIG 127 12/04/2019   Lab Results  Component Value Date   CHOLHDL 3.7 12/04/2019   Lab Results  Component Value Date   HGBA1C 6.3 (H) 12/04/2019       Assessment & Plan:   1. Keratotic lesion Long time present lesion on the scalp. Requests Cryo-Pen treatment. Frozen for 90 seconds. May need dermatology referral if still present in 3 weeks.  2. Actinic keratosis of scalp Frozen lesion near keratotic  horn on the left frontoparietal line. Recheck prn.    No orders of the defined types were placed in this encounter. Andres Shad, PA, have reviewed all documentation for this visit. The documentation on 01/18/20 for the exam, diagnosis, procedures, and orders are all accurate and complete.    Juluis Mire, CMA

## 2020-02-02 ENCOUNTER — Encounter
Admission: RE | Admit: 2020-02-02 | Discharge: 2020-02-02 | Disposition: A | Discharge status: Home / Self Care | Payer: Medicare Other | Source: Ambulatory Visit | Attending: Urology | Admitting: Urology

## 2020-02-02 ENCOUNTER — Other Ambulatory Visit: Payer: Self-pay

## 2020-02-02 HISTORY — DX: Unspecified osteoarthritis, unspecified site: M19.90

## 2020-02-02 HISTORY — DX: Unspecified renal colic: N23

## 2020-02-02 NOTE — Patient Instructions (Signed)
Your procedure is scheduled on: 02-11-20 THURSDAY Report to the Registration Desk on the 1st floor of the [facility]. To find out your arrival time, please call [phone] between 1PM - 3PM on: Wednesday 02/10/2020  REMEMBER: Instructions that are not followed completely may result in serious medical risk, up to and including death; or upon the discretion of your surgeon and anesthesiologist your surgery may need to be rescheduled.  Do not eat food after midnight the night before surgery.  No gum chewing, lozengers or hard candies.  You may however, drink CLEAR liquids up to 2 hours before you are scheduled to arrive for your surgery. Do not drink anything within 2 hours of your scheduled arrival time.  Clear liquids include: - water    Do NOT drink anything that is not on this list.  Type 1 and Type 2 diabetics should only drink water.   TAKE THESE MEDICATIONS THE MORNING OF SURGERY WITH A SIP OF WATER: AMLODIPINE METOPROLOL PANTOPRAZOLE (take one the night before and one on the morning of surgery - helps to prevent nausea after surgery.)  DO NOT TAKE RAMIPRIL MORNING OF SURGERY  LAST  DOSE OF PLAVIX 02/03/2020  One week prior to surgery: Stop Anti-inflammatories (NSAIDS) such as Advil, Aleve, Ibuprofen, Motrin, Naproxen, Naprosyn and ASPIRIN OR Aspirin based products such as Excedrin, Goodys Powder, BC Powder. Stop ANY OVER THE COUNTER supplements until after surgery. KRILL OIL AND COQ10 (However, you may continue taking Vitamin D, Vitamin B, and multivitamin up until the day before surgery.)  No Alcohol for 24 hours before or after surgery.  No Smoking including e-cigarettes for 24 hours prior to surgery.  No chewable tobacco products for at least 6 hours prior to surgery.  No nicotine patches on the day of surgery.  Do not use any "recreational" drugs for at least a week prior to your surgery.  Please be advised that the combination of cocaine and anesthesia may have  negative outcomes, up to and including death. If you test positive for cocaine, your surgery will be cancelled.  On the morning of surgery brush your teeth with toothpaste and water, you may rinse your mouth with mouthwash if you wish. Do not swallow any toothpaste or mouthwash.  Do not wear jewelry, make-up, hairpins, clips or nail polish.  Do not wear lotions, powders, or perfumes.   Do not shave body from the neck down 48 hours prior to surgery just in case you cut yourself which could leave a site for infection.  Also, freshly shaved skin may become irritated if using the CHG soap.  Contact lenses, hearing aids and dentures may not be worn into surgery.  Do not bring valuables to the hospital. [facility] is not responsible for any missing/lost belongings or valuables.   SHOWER THE MORNING   Notify your doctor if there is any change in your medical condition (cold, fever, infection).  Wear comfortable clothing (specific to your surgery type) to the hospital.  Plan for stool softeners for home use; pain medications have a tendency to cause constipation. You can also help prevent constipation by eating foods high in fiber such as fruits and vegetables and drinking plenty of fluids as your diet allows.  After surgery, you can help prevent lung complications by doing breathing exercises.  Take deep breaths and cough every 1-2 hours. Your doctor may order a device called an Incentive Spirometer to help you take deep breaths. When coughing or sneezing, hold a pillow firmly against  your incision with both hands. This is called "splinting." Doing this helps protect your incision. It also decreases belly discomfort.  If you are being discharged the day of surgery, you will not be allowed to drive home. You will need a responsible adult (18 years or older) to drive you home and stay with you that night.   Please call the [facility] Dept. at [phone] if you have any  questions about these instructions.  Visitation Policy:  Patients undergoing a surgery or procedure may have one family member or support person with them as long as that person is not COVID-19 positive or experiencing its symptoms.  That person may remain in the waiting area during the procedure.  Inpatient Visitation Update:   In an effort to ensure the safety of our team members and our patients, we are implementing a change to our visitation policy:  Effective Monday, Aug. 9, at 7 a.m., inpatients will be allowed one support person.  o The support person may change daily.  o The support person must pass our screening, gel in and out, and wear a mask at all times, including in the patient's room.  o Patients must also wear a mask when staff or their support person are in the room.  o Masking is required regardless of vaccination status.  Systemwide, no visitors 17 or younger.

## 2020-02-03 ENCOUNTER — Encounter: Payer: Self-pay | Admitting: Urology

## 2020-02-03 NOTE — H&P (Signed)
NAME: URI, TURNBOUGH MEDICAL RECORD TM:[id number] ACCOUNT [id number] DATE OF BIRTH:1945/12/25 FACILITY: [facility] LOCATION: [facility] PHYSICIAN:Kamden Reber Farrel Conners, MD  HISTORY AND PHYSICAL  DATE OF ADMISSION:  02/11/2020  CHIEF COMPLAINT:  Difficulty voiding.  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  He recently was treated for urinary tract infection.  IPS score was 29 with a quality of life score of 5.  Further  evaluation in the office included prostate ultrasound, which revealed a 51.5 mL prostate.  Uroflow study revealed a maximum flow rate of 3 mL per second and an average flow rate of 2.1 mL per second and a postvoid residual of 195 mL.  Cystoscopy  indicated lateral lobe prostatic hypertrophy with visual obstruction.  The bladder was mildly trabeculated.  The patient has been on tamsulosin for several years, and symptoms have worsened while on this medication.  He comes in now for UroLift  procedure.  ALLERGIES:  THE PATIENT WAS ALLERGIC TO CODEINE.  CURRENT MEDICATIONS:  Included amlodipine, aspirin, atorvastatin, Plavix, coenzyme Q, Voltaren, Imdur, metoprolol, multivitamins, nitroglycerin p.r.n., Omega-3 fish oil, omeprazole, and ramipril.  PAST SURGICAL HISTORY: 1.  ESWL in 2013, 2016, 2017, and 2018. 2.  Coronary artery catheterization with 2 stents placed in 2013. 3.  Colonoscopy in 2007 and 2017. 4.  Cystoscopy with stent placement in 2018. 5.  Cardiac catheterization in 2020.  PAST AND CURRENT MEDICAL CONDITIONS: 1.  Diabetes. 2.  Coronary artery disease. 3.  Lumbar radiculopathy. 4.  Thoracic degenerative joint disease. 5.  Chronic back pain. 6.  GERD. 7.  Hyperlipidemia. 8.  Hypertension.  REVIEW OF SYSTEMS:  The patient denies chest pain, shortness of breath, or stroke.  SOCIAL HISTORY:  The patient quit smoking 32 years ago with a 30-pack-year history.  He chews  tobacco.  He denied alcohol use.  FAMILY HISTORY:  Negative for urologic disease or cancer.  PHYSICAL EXAMINATION: GENERAL:  Well-nourished, white male in no acute distress. HEENT:  Sclerae were clear.  Pupils were equally round, reactive to light and accommodation. NECK:  No palpable thyroid nodules.  No audible carotid bruits. CARDIOVASCULAR:  Regular rhythm and rate. ABDOMEN:  Soft, nontender abdomen.  No CVA tenderness. GENITOURINARY:  He was circumcised.  Testes smooth, nontender, atrophic, 18 mL size each. RECTAL:  40 g, smooth, nontender prostate. NEUROMUSCULAR:  Alert and oriented x3.  PLAN:  UroLift procedure.  The patient has been cleared by his cardiologist to temporarily discontinue Plavix and aspirin.  IN/NUANCE  D:02/03/2020 T:02/03/2020 JOB:[id number]

## 2020-02-03 NOTE — Progress Notes (Signed)
[facility] Perioperative Services  Pre-Admission/Anesthesia Testing Clinical Review  Date: 02/09/20  Patient Demographics:  Name: Luke Rojas DOB:   Jan 05, 1946 MRN:   [id number]  Planned Surgical Procedure(s):    Case: [id number] Date/Time: 02/11/20 1115   Procedure: CYSTOSCOPY WITH INSERTION OF UROLIFT (N/A )   Anesthesia type: Choice   Pre-op diagnosis: ENLARGED PROSTATE   Location: [facility] [place] [facility]   Surgeons: Royston Cowper, MD      NOTE: Available PAT nursing documentation and vital signs have been reviewed. Clinical nursing staff has updated patient's PMH/PSHx, current medication list, and drug allergies/intolerances to ensure comprehensive history available to assist in medical decision making as it pertains to the aforementioned surgical procedure and anticipated anesthetic course.   Clinical Discussion:  Luke Rojas is a 74 y.o. male who is submitted for pre-surgical anesthesia review and clearance prior to him undergoing the above procedure. Patient is a Former Smoker (30 pack years; quit 03/1984). Pertinent PMH includes: angina, CAD, MI (2013), HTN, HLD, T2DM, COPD, GERD (on daily PPI therapy), OA.  Patient is followed by cardiology Clayborn Bigness, MD). He was last seen in the cardiology clinic on 01/04/2020; notes reviewed.  At the time of his clinic visit, patient doing well from a cardiovascular perspective.  He stated to cardiology provider "my heart is fine".  Patient denied any chest pain, shortness of breath, PND, orthopnea, palpitations, peripheral edema, vertiginous symptoms, or presyncope/syncope.  Myocardial perfusion imaging performed in 02/2018 demonstrated a small perfusion abnormality of mild intensity in the inferior region noted on stress imaging; LVEF 51%. Patient underwent cardiac catheterization in 12/2018 at [facility] that revealed multivessel coronary artery disease; 80% stenosis proximal RCA, 70%  stenosis mid RCA, 90% stenosis distal RCA, 50% stenosis ostial LAD to proximal LAD, 75% stenosis mid LAD.  Intervention was deferred and patient was transferred to [facility] for complex intervention versus CABG. PCI performed at [facility] resulted in placement of multivessel stenting.  Mid and proximal LAD was heavily calcified with 85% stenosis noted; rotablation atherectomy was performed.  TTE performed in 02/2019 revealed normal left ventricular systolic function with an LVEF of >55%; mild valvular insufficiency (see full interpretation of cardiovascular testing below).  Patient started on DAPT therapy (ASA + clopidogrel) following his stent placement.  Blood pressure has been stable on prescribed CCB, nitrate, beta-blocker, and ACEi regimen.  Patient is on a statin for his HLD.  T2DM being controlled with diet lifestyle modification; last hemoglobin A1c on 12/04/2019 was 6.3%. No changes were made to patient's medication regimen.  Patient to follow-up with outpatient cardiology in 6 months.  Patient scheduled to undergo cystoscopy with insertion of a UroLift on 12/092021 with Dr. Maryan Puls.  Given patient's past medical history significant for cardiovascular disease, presurgical cardiac clearance was sought by the PAT team.  Per cardiology (verbal to PAT APP; clearance form requested), "this patient is optimized for surgery and may proceed with the planned procedural course with an acceptable risk stratification".  Again, this patient is on daily DAPT therapy. He has been instructed on recommendations for holding his clopidogrel for 7 days prior to his procedure with plans to resume 2 days postoperatively.  Patient to continue daily low-dose ASA throughout his perioperative course. The patient has been instructed that his last dose of his anticoagulant will be on 02/03/2020.  He denies previous perioperative complications with anesthesia. He underwent a general anesthetic course here (ASA III) in 09/2016 with no  documented complications.   Vitals with BMI 02/02/2020 01/18/2020 12/15/2019  Height $Remov'5\' 8"'IrIDkF$  - -  Weight 190 lbs - -  BMI 59.5 - -  Systolic - 638 756  Diastolic - 53 54  Pulse - 53 67    Providers/Specialists:   NOTE: Primary physician provider listed below. Patient may have been seen by APP or partner within same practice.   PROVIDER ROLE LAST Towana Badger, MD Urology (Surgeon) 01/19/2020  Chrismon, Vickki Muff, PA Primary Care Provider 01/18/2020  Katrine Coho, MD Cardiology 01/04/2020   Allergies:  Codeine  Current Home Medications:   No current facility-administered medications for this encounter.   Marland Kitchen amLODipine (NORVASC) 5 MG tablet  . atorvastatin (LIPITOR) 80 MG tablet  . clopidogrel (PLAVIX) 75 MG tablet  . co-enzyme Q-10 30 MG capsule  . Krill Oil 1000 MG CAPS  . metoprolol tartrate (LOPRESSOR) 25 MG tablet  . nitroGLYCERIN (NITROSTAT) 0.4 MG SL tablet  . pantoprazole (PROTONIX) 40 MG tablet  . ramipril (ALTACE) 10 MG capsule  . tamsulosin (FLOMAX) 0.4 MG CAPS capsule  . baclofen (LIORESAL) 10 MG tablet  . diclofenac sodium (VOLTAREN) 1 % GEL  . finasteride (PROSCAR) 5 MG tablet  . isosorbide mononitrate (IMDUR) 60 MG 24 hr tablet  . methocarbamol (ROBAXIN) 500 MG tablet  . MULTIPLE VITAMIN PO  . Omega-3 Fatty Acids (FISH OIL) 1200 MG CAPS  . predniSONE (STERAPRED UNI-PAK 21 TAB) 10 MG (21) TBPK tablet  . sulfamethoxazole-trimethoprim (BACTRIM DS) 800-160 MG tablet   History:   Past Medical History:  Diagnosis Date  . Arthritis    OSTEOARTHRITIS  . Chronic kidney disease    kidney stones  . COPD (chronic obstructive pulmonary disease) ([place])   . Coronary artery disease    2 stents   . Diabetes mellitus without complication ([place])   . GERD (gastroesophageal reflux disease)   . Hypercholesteremia   . Hypertension   . Myocardial infarction ([place]) 2013  . Renal colic    Past Surgical History:  Procedure Laterality Date  . CARDIAC  CATHETERIZATION  2013   2 stents  . CATARACT EXTRACTION, BILATERAL    . COLONOSCOPY  04/27/05  . COLONOSCOPY WITH PROPOFOL N/A 06/08/2015   Procedure: COLONOSCOPY WITH PROPOFOL;  Surgeon: Robert Bellow, MD;  Location: [facility] ENDOSCOPY;  Service: Endoscopy;  Laterality: N/A;  . CYSTOSCOPY WITH STENT PLACEMENT Right 09/16/2016   Procedure: CYSTOSCOPY WITH STENT PLACEMENT;  Surgeon: Royston Cowper, MD;  Location: [facility] ORS;  Service: Urology;  Laterality: Right;  . EXTRACORPOREAL SHOCK WAVE LITHOTRIPSY Left 11/04/2014   has had 2 previous lithotripsies  . EXTRACORPOREAL SHOCK WAVE LITHOTRIPSY Left 03/24/2015   Procedure: EXTRACORPOREAL SHOCK WAVE LITHOTRIPSY (ESWL);  Surgeon: Royston Cowper, MD;  Location: [facility] ORS;  Service: Urology;  Laterality: Left;  . EXTRACORPOREAL SHOCK WAVE LITHOTRIPSY Right 10/04/2016   Procedure: EXTRACORPOREAL SHOCK WAVE LITHOTRIPSY (ESWL);  Surgeon: Royston Cowper, MD;  Location: [facility] ORS;  Service: Urology;  Laterality: Right;  . LEFT HEART CATH AND CORONARY ANGIOGRAPHY N/A 12/05/2018   Procedure: LEFT HEART CATH AND CORONARY ANGIOGRAPHY with possible pci and stent;  Surgeon: Yolonda Kida, MD;  Location: [place] CV LAB;  Service: Cardiovascular;  Laterality: N/A;   Family History  Problem Relation Age of Onset  . Pulmonary embolism Mother   . Transient ischemic attack Mother   . Diabetes Mother   . Pancreatic cancer Father   . Hypertension Father   . Diabetes Father   .  Cirrhosis Brother    Social History   Tobacco Use  . Smoking status: Former Smoker    Packs/day: 1.00    Years: 30.00    Pack years: 30.00    Types: Cigarettes    Quit date: 03/05/1984    Years since quitting: 35.9  . Smokeless tobacco: Current User    Types: Chew  Vaping Use  . Vaping Use: Never used  Substance Use Topics  . Alcohol use: No    Alcohol/week: 0.0 standard drinks  . Drug use: No    Pertinent Clinical Results:  LABS: Labs reviewed: Acceptable for  surgery.   Recent Results (from the past 2160 hour(s))  CBC with Differential/Platelet     Status: Abnormal   Collection Time: 12/04/19  8:10 AM  Result Value Ref Range   WBC 13.5 (H) 3.4 - 10.8 x10E3/uL   RBC 4.79 4.14 - 5.80 x10E6/uL   Hemoglobin 14.1 13.0 - 17.7 g/dL   Hematocrit 42.4 37.5 - 51.0 %   MCV 89 79 - 97 fL   MCH 29.4 26.6 - 33.0 pg   MCHC 33.3 31 - 35 g/dL   RDW 14.4 11.6 - 15.4 %   Platelets 186 150 - 450 x10E3/uL   Neutrophils 66 Not Estab. %   Lymphs 24 Not Estab. %   Monocytes 10 Not Estab. %   Eos 0 Not Estab. %   Basos 0 Not Estab. %   Neutrophils Absolute 8.9 (H) 1.40 - 7.00 x10E3/uL   Lymphocytes Absolute 3.2 (H) 0 - 3 x10E3/uL   Monocytes Absolute 1.3 (H) 0 - 0 x10E3/uL   EOS (ABSOLUTE) 0.0 0.0 - 0.4 x10E3/uL   Basophils Absolute 0.0 0 - 0 x10E3/uL   Immature Granulocytes 0 Not Estab. %   Immature Grans (Abs) 0.0 0.0 - 0.1 x10E3/uL  Comprehensive metabolic panel     Status: Abnormal   Collection Time: 12/04/19  8:10 AM  Result Value Ref Range   Glucose 84 65 - 99 mg/dL   BUN 23 8 - 27 mg/dL   Creatinine, Ser 1.33 (H) 0.76 - 1.27 mg/dL   GFR calc non Af Amer 52 (L) >59 mL/min/1.73   GFR calc Af Amer 60 >59 mL/min/1.73    Comment: **[facility] currently reports eGFR in compliance with the current**   recommendations of the [employer]. [facility] will   update reporting as new guidelines are published from the NKF-ASN   Task force.    BUN/Creatinine Ratio 17 10 - 24   Sodium 138 134 - 144 mmol/L   Potassium 4.1 3.5 - 5.2 mmol/L   Chloride 103 96 - 106 mmol/L   CO2 19 (L) 20 - 29 mmol/L   Calcium 9.1 8.6 - 10.2 mg/dL   Total Protein 6.8 6.0 - 8.5 g/dL   Albumin 4.1 3.7 - 4.7 g/dL   Globulin, Total 2.7 1.5 - 4.5 g/dL   Albumin/Globulin Ratio 1.5 1.2 - 2.2   Bilirubin Total 0.3 0.0 - 1.2 mg/dL   Alkaline Phosphatase 78 44 - 121 IU/L    Comment:               **Please note reference interval change**   AST 16 0 - 40 IU/L   ALT 19 0 - 44  IU/L  Hemoglobin A1c     Status: Abnormal   Collection Time: 12/04/19  8:10 AM  Result Value Ref Range   Hgb A1c MFr Bld 6.3 (H) 4.8 - 5.6 %    Comment:  Prediabetes: 5.7 - 6.4          Diabetes: >6.4          Glycemic control for adults with diabetes: <7.0    Est. average glucose Bld gHb Est-mCnc 134 mg/dL  Lipid panel     Status: Abnormal   Collection Time: 12/04/19  8:10 AM  Result Value Ref Range   Cholesterol, Total 118 100 - 199 mg/dL   Triglycerides 127 0 - 149 mg/dL   HDL 32 (L) >39 mg/dL   VLDL Cholesterol Cal 23 5 - 40 mg/dL   LDL Chol Calc (NIH) 63 0 - 99 mg/dL   Chol/HDL Ratio 3.7 0.0 - 5.0 ratio    Comment:                                   T. Chol/HDL Ratio                                             Men  Women                               1/2 Avg.Risk  3.4    3.3                                   Avg.Risk  5.0    4.4                                2X Avg.Risk  9.6    7.1                                3X Avg.Risk 23.4   11.0   TSH     Status: None   Collection Time: 12/04/19  8:10 AM  Result Value Ref Range   TSH 2.930 0.450 - 4.500 uIU/mL  POCT urinalysis dipstick     Status: Abnormal   Collection Time: 12/08/19  1:29 PM  Result Value Ref Range   Color, UA yellow    Clarity, UA cloudy    Glucose, UA Negative Negative   Bilirubin, UA neg    Ketones, UA neg    Spec Grav, UA 1.020 1.010 - 1.025   Blood, UA neg    pH, UA 6.0 5.0 - 8.0   Protein, UA Negative Negative   Urobilinogen, UA 0.2 0.2 or 1.0 E.U./dL   Nitrite, UA ++    Leukocytes, UA 4+ (A) Negative   Appearance     Odor very bad   Urine Culture     Status: Abnormal   Collection Time: 12/09/19 12:00 AM   Specimen: Urine   Urine  Result Value Ref Range   Urine Culture, Routine Final report (A)    Organism ID, Bacteria Escherichia coli (A)     Comment: Cefazolin <=4 ug/mL Cefazolin with an MIC <=16 predicts susceptibility to the oral agents cefaclor, cefdinir, cefpodoxime, cefprozil,  cefuroxime, cephalexin, and loracarbef when used for therapy of uncomplicated urinary tract infections due to E. coli, Klebsiella pneumoniae, and Proteus mirabilis. Greater than 100,000  colony forming units per mL    Antimicrobial Susceptibility Comment     Comment:       ** S = Susceptible; I = Intermediate; R = Resistant **                    P = Positive; N = Negative             MICS are expressed in micrograms per mL    Antibiotic                 RSLT#1    RSLT#2    RSLT#3    RSLT#4 Amoxicillin/Clavulanic Acid    R Ampicillin                     R Cefazolin                      R Cefepime                       S Ceftriaxone                    S Cefuroxime                     S Ciprofloxacin                  R Ertapenem                      S Gentamicin                     S Imipenem                       S Levofloxacin                   R Meropenem                      S Nitrofurantoin                 S Piperacillin/Tazobactam        R Tetracycline                   S Tobramycin                     S Trimethoprim/Sulfa             S      ECG: Date: 04/13/2019 Time ECG obtained: 1106 AM Rate: 82 bpm Rhythm: normal sinus Axis (leads I and aVF): Normal Intervals: PR 136 ms. QRS 76 ms. QTc 443 ms. ST segment and T wave changes: No evidence of acute ST segment elevation or depression Comparison: Similar to previous tracing obtained on 01/16/2019   IMAGING / PROCEDURES: ECHOCARDIOGRAM done on 02/09/2019 1. LVEF >55% 2. Normal left ventricular systolic function 3. Normal right ventricular systolic function mild TV insufficiency 4. Trace MV and AV insufficiency 5. No valvular stenosis  LEFT HEART CATHETERIZATION AND CORONARY ANGIOGRAPHY done on 12/19/2019 1. Successful PCI of 75% ISR of proximal RCA with 3x16 mm Synergy bioabsorbable DES with 0% residual stenosis.  2. Successful PCI of 85% mid RCA with 3x20 mm Synergy bioabsorbable DES with 0% residual stenosis.   3. Successful PCI of 95% mid RCA with 3x16 mm Synergy bioabsorbable DES with  0% residual stenosis.  4. Rotablation atherectomy of heavily calcified diffusely diseased proximal and mid LAD (85%) with 1.75 mm RotoPro  5. Successful IVUS guided PCI of proximal and mid LAD with overlapping 3x28m and 3x363mSynergy bioabsorbable DES with 0% residual stenosis.  6. Post PCI IVUS confirms excellent stent apposition and expansion with MLA 7.00 mm^2.   LEFT HEART CATHETERIZATION AND CORONARY ANGIOGRAPHY done on 12/05/2018 1. Multivessel coronary artery disease  Prox RCA lesion is 80% stenosed.  Mid RCA lesion is 70% stenosed.  Dist RCA lesion is 90% stenosed.  Ost LAD to Prox LAD lesion is 50% stenosed.  Mid LAD lesion is 75% stenosed.  Previously placed Prox Cx to Dist Cx stent (unknown type) is widely patent 2. Widely patent left main mild to moderate calcium 3. LAD extensive calcification moderate to severe 50% mid and proximal lesion 70% focal mid lesion 4. Circumflex large widely patent mid stent no significant obstructive disease 5. RCA medium size multiple lesions proximal mid distal widely patent proximal stent 6. Successful IFR of mid LAD lesion measuring 0.83 7. Defer intervention today. Recommend referral to tertiary care center for evaluation of possible high risk multivessel complex intervention versus coronary bypass surgery. [facility] Ot[facility]stance Holsterone [facility]2018 1. Blood pressure demonstrated normal response to exercise 2. There was no ST segment deviation noted during stress 3. LVEF moderately decreased at 30-44% 4. This was a normal low risk study  BILATERAL RENAL ARTERY ULTRASOUND done on 05/13/2018 1. Right: Normal size right kidney. Cyst(s) noted. Normal right resisitive Index. Normal cortical thickness of right kidney. No evidence of right renal artery stenosis. RRV flow present.  2. Left:  Normal size of left kidney. Normal  left resistive index. Normal cortical thickness of the left kidney. No evidence of left renal artery stenosis. LRV flow present.   Impression and Plan:  Luke SANTOSUOSSOas been referred for pre-anesthesia review and clearance prior to him undergoing the planned anesthetic and procedural courses. Available labs, pertinent testing, and imaging results were personally reviewed by me. This patient has been appropriately cleared by cardiology.   Based on clinical review performed today (02/09/20), barring any significant acute changes in the patient's overall condition, it is anticipated that he will be able to proceed with the planned surgical intervention. Any acute changes in clinical condition may necessitate his procedure being postponed and/or cancelled. Pre-surgical instructions were reviewed with the patient during his PAT appointment and questions were fielded by PAT clinical staff.  BrHonor LohMSN, APRN, FNP-C, CEN CoThe Center For Sight PaPeri-operative Services Nurse Practitioner Phone: [phone]/07/21 2:13 PM  NOTE: This note has been prepared using Dragon dictation software. Despite my best ability to proofread, there is always the potential that unintentional transcriptional errors may still occur from this process.

## 2020-02-09 ENCOUNTER — Other Ambulatory Visit
Admission: RE | Admit: 2020-02-09 | Discharge: 2020-02-09 | Disposition: A | Discharge status: Home / Self Care | Payer: Medicare Other | Source: Ambulatory Visit | Attending: Urology | Admitting: Urology

## 2020-02-09 ENCOUNTER — Other Ambulatory Visit: Payer: Self-pay

## 2020-02-09 DIAGNOSIS — Z01812 Encounter for preprocedural laboratory examination: Secondary | ICD-10-CM | POA: Diagnosis present

## 2020-02-09 DIAGNOSIS — Z20822 Contact with and (suspected) exposure to covid-19: Secondary | ICD-10-CM | POA: Diagnosis not present

## 2020-02-09 LAB — SARS CORONAVIRUS 2 (TAT 6-24 HRS): SARS Coronavirus 2: NEGATIVE

## 2020-02-11 ENCOUNTER — Other Ambulatory Visit: Payer: Self-pay

## 2020-02-11 ENCOUNTER — Encounter: Payer: Self-pay | Admitting: Urology

## 2020-02-11 ENCOUNTER — Ambulatory Visit
Admission: RE | Admit: 2020-02-11 | Discharge: 2020-02-11 | Disposition: A | Discharge status: Home / Self Care | Payer: Medicare Other | Attending: Urology | Admitting: Urology

## 2020-02-11 ENCOUNTER — Ambulatory Visit: Payer: Medicare Other | Admitting: Urgent Care

## 2020-02-11 ENCOUNTER — Encounter
Admission: RE | Disposition: A | Discharge status: Home / Self Care | Payer: Self-pay | Source: Home / Self Care | Attending: Urology

## 2020-02-11 DIAGNOSIS — N138 Other obstructive and reflux uropathy: Secondary | ICD-10-CM | POA: Insufficient documentation

## 2020-02-11 DIAGNOSIS — Z7902 Long term (current) use of antithrombotics/antiplatelets: Secondary | ICD-10-CM | POA: Insufficient documentation

## 2020-02-11 DIAGNOSIS — Z955 Presence of coronary angioplasty implant and graft: Secondary | ICD-10-CM | POA: Diagnosis not present

## 2020-02-11 DIAGNOSIS — F17220 Nicotine dependence, chewing tobacco, uncomplicated: Secondary | ICD-10-CM | POA: Diagnosis not present

## 2020-02-11 DIAGNOSIS — Z791 Long term (current) use of non-steroidal anti-inflammatories (NSAID): Secondary | ICD-10-CM | POA: Insufficient documentation

## 2020-02-11 DIAGNOSIS — Z79899 Other long term (current) drug therapy: Secondary | ICD-10-CM | POA: Diagnosis not present

## 2020-02-11 DIAGNOSIS — Z7982 Long term (current) use of aspirin: Secondary | ICD-10-CM | POA: Diagnosis not present

## 2020-02-11 DIAGNOSIS — I252 Old myocardial infarction: Secondary | ICD-10-CM | POA: Insufficient documentation

## 2020-02-11 DIAGNOSIS — N401 Enlarged prostate with lower urinary tract symptoms: Secondary | ICD-10-CM | POA: Diagnosis present

## 2020-02-11 DIAGNOSIS — Z885 Allergy status to narcotic agent status: Secondary | ICD-10-CM | POA: Insufficient documentation

## 2020-02-11 HISTORY — PX: CYSTOSCOPY WITH INSERTION OF UROLIFT: SHX6678

## 2020-02-11 HISTORY — DX: Chronic obstructive pulmonary disease, unspecified: J44.9

## 2020-02-11 LAB — GLUCOSE, CAPILLARY
Glucose-Capillary: 107 mg/dL — ABNORMAL HIGH (ref 70–99)
Glucose-Capillary: 96 mg/dL (ref 70–99)

## 2020-02-11 SURGERY — CYSTOSCOPY WITH INSERTION OF UROLIFT
Anesthesia: General

## 2020-02-11 MED ORDER — FENTANYL CITRATE (PF) 100 MCG/2ML IJ SOLN: 25.0000 ug | INTRAMUSCULAR | Status: DC | PRN

## 2020-02-11 MED ORDER — FENTANYL CITRATE (PF) 100 MCG/2ML IJ SOLN
INTRAMUSCULAR | Status: AC
  Filled 2020-02-11: qty 2

## 2020-02-11 MED ORDER — LIDOCAINE HCL URETHRAL/MUCOSAL 2 % EX GEL
CUTANEOUS | Status: DC | PRN
  Administered 2020-02-11: 1

## 2020-02-11 MED ORDER — CEFAZOLIN SODIUM-DEXTROSE 1-4 GM/50ML-% IV SOLN
INTRAVENOUS | Status: AC
  Filled 2020-02-11: qty 50

## 2020-02-11 MED ORDER — ONDANSETRON HCL 4 MG/2ML IJ SOLN: 4.0000 mg | Freq: Once | INTRAMUSCULAR | Status: DC | PRN

## 2020-02-11 MED ORDER — FENTANYL CITRATE (PF) 100 MCG/2ML IJ SOLN
INTRAMUSCULAR | Status: DC | PRN
  Administered 2020-02-11: 100 ug via INTRAVENOUS

## 2020-02-11 MED ORDER — CIPROFLOXACIN HCL 500 MG PO TABS: 500.0000 mg | ORAL_TABLET | Freq: Two times a day (BID) | ORAL | 0 refills | Status: DC

## 2020-02-11 MED ORDER — LIDOCAINE HCL (PF) 2 % IJ SOLN
INTRAMUSCULAR | Status: AC
  Filled 2020-02-11: qty 5

## 2020-02-11 MED ORDER — CHLORHEXIDINE GLUCONATE 0.12 % MT SOLN
OROMUCOSAL | Status: AC
  Filled 2020-02-11: qty 15

## 2020-02-11 MED ORDER — DEXAMETHASONE SODIUM PHOSPHATE 10 MG/ML IJ SOLN
INTRAMUSCULAR | Status: AC
  Filled 2020-02-11: qty 1

## 2020-02-11 MED ORDER — SODIUM CHLORIDE 0.9 % IV SOLN: INTRAVENOUS | Status: DC

## 2020-02-11 MED ORDER — PROPOFOL 10 MG/ML IV BOLUS
INTRAVENOUS | Status: DC | PRN
  Administered 2020-02-11: 100 mg via INTRAVENOUS

## 2020-02-11 MED ORDER — DEXAMETHASONE SODIUM PHOSPHATE 10 MG/ML IJ SOLN
INTRAMUSCULAR | Status: DC | PRN
  Administered 2020-02-11: 10 mg via INTRAVENOUS

## 2020-02-11 MED ORDER — CHLORHEXIDINE GLUCONATE 0.12 % MT SOLN
15.0000 mL | Freq: Once | OROMUCOSAL | Status: AC
  Administered 2020-02-11: 15 mL via OROMUCOSAL

## 2020-02-11 MED ORDER — ORAL CARE MOUTH RINSE: 15.0000 mL | Freq: Once | OROMUCOSAL | Status: AC

## 2020-02-11 MED ORDER — ONDANSETRON HCL 4 MG/2ML IJ SOLN
INTRAMUSCULAR | Status: AC
  Filled 2020-02-11: qty 2

## 2020-02-11 MED ORDER — PROPOFOL 10 MG/ML IV BOLUS
INTRAVENOUS | Status: AC
  Filled 2020-02-11: qty 20

## 2020-02-11 MED ORDER — CEFAZOLIN SODIUM-DEXTROSE 1-4 GM/50ML-% IV SOLN
1.0000 g | Freq: Once | INTRAVENOUS | Status: AC
  Administered 2020-02-11: 1 g via INTRAVENOUS

## 2020-02-11 MED ORDER — ONDANSETRON HCL 4 MG/2ML IJ SOLN
INTRAMUSCULAR | Status: DC | PRN
  Administered 2020-02-11: 4 mg via INTRAVENOUS

## 2020-02-11 MED ORDER — LIDOCAINE HCL URETHRAL/MUCOSAL 2 % EX GEL
CUTANEOUS | Status: AC
  Filled 2020-02-11: qty 10

## 2020-02-11 MED ORDER — LIDOCAINE HCL (CARDIAC) PF 100 MG/5ML IV SOSY
PREFILLED_SYRINGE | INTRAVENOUS | Status: DC | PRN
  Administered 2020-02-11: 50 mg via INTRAVENOUS

## 2020-02-11 MED ORDER — URIBEL 118 MG PO CAPS
1.0000 | ORAL_CAPSULE | Freq: Four times a day (QID) | ORAL | 3 refills | Status: DC | PRN
Start: 2020-02-11 — End: 2021-10-17

## 2020-02-11 SURGICAL SUPPLY — 14 items
BAG DRAIN CYSTO-URO LG1000N (MISCELLANEOUS) ×3 IMPLANT
GLOVE BIO SURGEON STRL SZ7.5 (GLOVE) ×3 IMPLANT
GOWN STRL REUS W/ TWL LRG LVL3 (GOWN DISPOSABLE) ×1 IMPLANT
GOWN STRL REUS W/ TWL XL LVL3 (GOWN DISPOSABLE) ×1 IMPLANT
GOWN STRL REUS W/TWL LRG LVL3 (GOWN DISPOSABLE) ×3
GOWN STRL REUS W/TWL XL LVL3 (GOWN DISPOSABLE) ×3
KIT TURNOVER CYSTO (KITS) ×3 IMPLANT
MANIFOLD NEPTUNE II (INSTRUMENTS) ×3 IMPLANT
PACK CYSTO AR (MISCELLANEOUS) ×3 IMPLANT
SET CYSTO W/LG BORE CLAMP LF (SET/KITS/TRAYS/PACK) ×3 IMPLANT
SURGILUBE 2OZ TUBE FLIPTOP (MISCELLANEOUS) ×3 IMPLANT
SYSTEM UROLIFT (Male Continence) ×12 IMPLANT
WATER STERILE IRR 1000ML POUR (IV SOLUTION) ×3 IMPLANT
WATER STERILE IRR 3000ML UROMA (IV SOLUTION) ×3 IMPLANT

## 2020-02-11 NOTE — Anesthesia Preprocedure Evaluation (Signed)
Anesthesia Evaluation  Patient identified by MRN, date of birth, ID band Patient awake    Reviewed: Allergy & Precautions, H&P , NPO status , Patient's Chart, lab work & pertinent test results, reviewed documented beta blocker date and time   Airway Mallampati: II  TM Distance: >3 FB Neck ROM: full    Dental  (+) Teeth Intact, Upper Dentures   Pulmonary COPD, former smoker,    Pulmonary exam normal        Cardiovascular Exercise Tolerance: Good hypertension, On Medications + CAD, + Past MI and + Cardiac Stents  Normal cardiovascular exam Rhythm:regular Rate:Normal     Neuro/Psych  Neuromuscular disease negative psych ROS   GI/Hepatic negative GI ROS, Neg liver ROS,   Endo/Other  negative endocrine ROSdiabetes, Well Controlled  Renal/GU Renal disease  negative genitourinary   Musculoskeletal   Abdominal   Peds  Hematology negative hematology ROS (+)   Anesthesia Other Findings Past Medical History: No date: Arthritis     Comment:  OSTEOARTHRITIS No date: Chronic kidney disease     Comment:  kidney stones No date: COPD (chronic obstructive pulmonary disease) ([facility]) No date: Coronary artery disease     Comment:  2 stents  No date: Diabetes mellitus without complication ([facility]) No date: GERD (gastroesophageal reflux disease) No date: Hypercholesteremia No date: Hypertension 2013: Myocardial infarction ([place]) No date: Renal colic Past Surgical History: 2013: CARDIAC CATHETERIZATION     Comment:  2 stents No date: CATARACT EXTRACTION, BILATERAL 04/27/05: COLONOSCOPY 06/08/2015: COLONOSCOPY WITH PROPOFOL; N/A     Comment:  Procedure: COLONOSCOPY WITH PROPOFOL;  Surgeon: Robert Bellow, MD;  Location: [facility] ENDOSCOPY;  Service:               Endoscopy;  Laterality: N/A; 09/16/2016: CYSTOSCOPY WITH STENT PLACEMENT; Right     Comment:  Procedure: CYSTOSCOPY WITH STENT PLACEMENT;  Surgeon:                Royston Cowper, MD;  Location: [facility] ORS;  Service:               Urology;  Laterality: Right; 11/04/2014: EXTRACORPOREAL SHOCK WAVE LITHOTRIPSY; Left     Comment:  has had 2 previous lithotripsies 03/24/2015: EXTRACORPOREAL SHOCK WAVE LITHOTRIPSY; Left     Comment:  Procedure: EXTRACORPOREAL SHOCK WAVE LITHOTRIPSY (ESWL);              Surgeon: Royston Cowper, MD;  Location: [facility] ORS;                Service: Urology;  Laterality: Left; 10/04/2016: EXTRACORPOREAL SHOCK WAVE LITHOTRIPSY; Right     Comment:  Procedure: EXTRACORPOREAL SHOCK WAVE LITHOTRIPSY (ESWL);              Surgeon: Royston Cowper, MD;  Location: [facility] ORS;                Service: Urology;  Laterality: Right; 12/05/2018: LEFT HEART CATH AND CORONARY ANGIOGRAPHY; N/A     Comment:  Procedure: LEFT HEART CATH AND CORONARY ANGIOGRAPHY with              possible pci and stent;  Surgeon: Yolonda Kida, MD;              Location: [place] CV LAB;  Service: Cardiovascular;              Laterality: N/A;   Reproductive/Obstetrics negative  OB ROS                             Anesthesia Physical Anesthesia Plan  ASA: III  Anesthesia Plan: General   Post-op Pain Management:    Induction:   PONV Risk Score and Plan:   Airway Management Planned:   Additional Equipment:   Intra-op Plan:   Post-operative Plan:   Informed Consent: I have reviewed the patients History and Physical, chart, labs and discussed the procedure including the risks, benefits and alternatives for the proposed anesthesia with the patient or authorized representative who has indicated his/her understanding and acceptance.     Dental Advisory Given  Plan Discussed with: CRNA  Anesthesia Plan Comments:         Anesthesia Quick Evaluation

## 2020-02-11 NOTE — Op Note (Signed)
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 Yves Dill MD  Anesthesia: General  Indications:See the history and physical. After informed consent the above procedure(s) were requested     Technique and findings: After adequate general anesthesia had been obtained the patient was brought to the cystoscopy suite and placed in lithotomy position. The perineum was then sterilely prepped and draped in the usual fashion.Cystoscopy was performed, and patient was found to have lateral lobe obstruction with no evidence of a median lobe. The 1st pair of implants were placed at the bladder neck 1.5 - 2cm from the bladder neck. The 2nd pair of implants were placed at the level of the verumontanum. A repeat cystoscopy was performed and then another pair of implants were stacked between the prior pairs. Cystoscopy was performed at completion, and no injury to the bladder or urethra was detected and the prostatic fossa was widely patent.  The cystoscope was then removed and 10 cc of viscous Xylocaine instilled within the urethra.  The procedure was then terminated and patient transferred to the recovery room in stable condition.  Blood loss was minimal.

## 2020-02-11 NOTE — H&P (Signed)
Date of Initial H&P: 02/03/20  History reviewed, patient examined, no change in status, stable for surgery.

## 2020-02-11 NOTE — Discharge Instructions (Addendum)
AMBULATORY SURGERY  DISCHARGE INSTRUCTIONS   1) The drugs that you were given will stay in your system until tomorrow so for the next 24 hours you should not:  A) Drive an automobile B) Make any legal decisions C) Drink any alcoholic beverage   2) You may resume regular meals tomorrow.  Today it is better to start with liquids and gradually work up to solid foods.  You may eat anything you prefer, but it is better to start with liquids, then soup and crackers, and gradually work up to solid foods.   3) Please notify your doctor immediately if you have any unusual bleeding, trouble breathing, redness and pain at the surgery site, drainage, fever, or pain not relieved by medication.    4) Additional Instructions:                  Please contact your physician with any problems or Same Day Surgery at [phone], Monday through Friday 6 am to 4 pm, or [place] at [facility] number at [phone].  Dysuria Dysuria is pain or discomfort while urinating. The pain or discomfort may be felt in the part of your body that drains urine from the bladder (urethra) or in the surrounding tissue of the genitals. The pain may also be felt in the groin area, lower abdomen, or lower back. You may have to urinate frequently or have the sudden feeling that you have to urinate (urgency). Dysuria can affect both men and women, but it is more common in women. Dysuria can be caused by many different things, including:  Urinary tract infection.  Kidney stones or bladder stones.  Certain sexually transmitted infections (STIs), such as chlamydia.  Dehydration.  Inflammation of the tissues of the vagina.  Use of certain medicines.  Use of certain soaps or scented products that cause irritation. Follow these instructions at home: General instructions  Watch your condition for any changes.  Urinate often. Avoid holding urine for long periods of time.  After a  bowel movement or urination, women should cleanse from front to back, using each tissue only once.  Urinate after sexual intercourse.  Keep all follow-up visits as told by your health care provider. This is important.  If you had any tests done to find the cause of dysuria, it is up to you to get your test results. Ask your health care provider, or the department that is doing the test, when your results will be ready. Eating and drinking   Drink enough fluid to keep your urine pale yellow.  Avoid caffeine, tea, and alcohol. They can irritate the bladder and make dysuria worse. In men, alcohol may irritate the prostate. Medicines  Take over-the-counter and prescription medicines only as told by your health care provider.  If you were prescribed an antibiotic medicine, take it as told by your health care provider. Do not stop taking the antibiotic even if you start to feel better. Contact a health care provider if:  You have a fever.  You develop pain in your back or sides.  You have nausea or vomiting.  You have blood in your urine.  You are not urinating as often as you usually do. Get help right away if:  Your pain is severe and not relieved with medicines.  You cannot eat or drink without vomiting.  You are confused.  You have a rapid heartbeat while at rest.  You have shaking or chills.  You feel extremely weak. Summary  Dysuria is  pain or discomfort while urinating. Many different conditions can lead to dysuria.  If you have dysuria, you may have to urinate frequently or have the sudden feeling that you have to urinate (urgency).  Watch your condition for any changes. Keep all follow-up visits as told by your health care provider.  Make sure that you urinate often and drink enough fluid to keep your urine pale yellow. This information is not intended to replace advice given to you by your health care provider. Make sure you discuss any questions you have with  your health care provider. Document Revised: 02/01/2017 Document Reviewed: 12/06/2016 Elsevier Patient Education  [place]. Benign Prostatic Hyperplasia  Benign prostatic hyperplasia (BPH) is an enlarged prostate gland that is caused by the normal aging process and not by cancer. The prostate is a walnut-sized gland that is involved in the production of semen. It is located in front of the rectum and below the bladder. The bladder stores urine and the urethra is the tube that carries the urine out of the body. The prostate may get bigger as a man gets older. An enlarged prostate can press on the urethra. This can make it harder to pass urine. The build-up of urine in the bladder can cause infection. Back pressure and infection may progress to bladder damage and kidney (renal) failure. What are the causes? This condition is part of a normal aging process. However, not all men develop problems from this condition. If the prostate enlarges away from the urethra, urine flow will not be blocked. If it enlarges toward the urethra and compresses it, there will be problems passing urine. What increases the risk? This condition is more likely to develop in men over the age of 39 years. What are the signs or symptoms? Symptoms of this condition include:  Getting up often during the night to urinate.  Needing to urinate frequently during the day.  Difficulty starting urine flow.  Decrease in size and strength of your urine stream.  Leaking (dribbling) after urinating.  Inability to pass urine. This needs immediate treatment.  Inability to completely empty your bladder.  Pain when you pass urine. This is more common if there is also an infection.  Urinary tract infection (UTI). How is this diagnosed? This condition is diagnosed based on your medical history, a physical exam, and your symptoms. Tests will also be done, such as:  A post-void bladder scan. This measures any amount of  urine that may remain in your bladder after you finish urinating.  A digital rectal exam. In a rectal exam, your health care provider checks your prostate by putting a lubricated, gloved finger into your rectum to feel the back of your prostate gland. This exam detects the size of your gland and any abnormal lumps or growths.  An exam of your urine (urinalysis).  A prostate specific antigen (PSA) screening. This is a blood test used to screen for prostate cancer.  An ultrasound. This test uses sound waves to electronically produce a picture of your prostate gland. Your health care provider may refer you to a specialist in kidney and prostate diseases (urologist). How is this treated? Once symptoms begin, your health care provider will monitor your condition (active surveillance or watchful waiting). Treatment for this condition will depend on the severity of your condition. Treatment may include:  Observation and yearly exams. This may be the only treatment needed if your condition and symptoms are mild.  Medicines to relieve your symptoms, including: ?  Medicines to shrink the prostate. ? Medicines to relax the muscle of the prostate.  Surgery in severe cases. Surgery may include: ? Prostatectomy. In this procedure, the prostate tissue is removed completely through an open incision or with a laparoscope or robotics. ? Transurethral resection of the prostate (TURP). In this procedure, a tool is inserted through the opening at the tip of the penis (urethra). It is used to cut away tissue of the inner core of the prostate. The pieces are removed through the same opening of the penis. This removes the blockage. ? Transurethral incision (TUIP). In this procedure, small cuts are made in the prostate. This lessens the prostate's pressure on the urethra. ? Transurethral microwave thermotherapy (TUMT). This procedure uses microwaves to create heat. The heat destroys and removes a small amount of  prostate tissue. ? Transurethral needle ablation (TUNA). This procedure uses radio frequencies to destroy and remove a small amount of prostate tissue. ? Interstitial laser coagulation ([place]). This procedure uses a laser to destroy and remove a small amount of prostate tissue. ? Transurethral electrovaporization (TUVP). This procedure uses electrodes to destroy and remove a small amount of prostate tissue. ? Prostatic urethral lift. This procedure inserts an implant to push the lobes of the prostate away from the urethra. Follow these instructions at home:  Take over-the-counter and prescription medicines only as told by your health care provider.  Monitor your symptoms for any changes. Contact your health care provider with any changes.  Avoid drinking large amounts of liquid before going to bed or out in public.  Avoid or reduce how much caffeine or alcohol you drink.  Give yourself time when you urinate.  Keep all follow-up visits as told by your health care provider. This is important. Contact a health care provider if:  You have unexplained back pain.  Your symptoms do not get better with treatment.  You develop side effects from the medicine you are taking.  Your urine becomes very dark or has a bad smell.  Your lower abdomen becomes distended and you have trouble passing your urine. Get help right away if:  You have a fever or chills.  You suddenly cannot urinate.  You feel lightheaded, or very dizzy, or you faint.  There are large amounts of blood or clots in the urine.  Your urinary problems become hard to manage.  You develop moderate to severe low back or flank pain. The flank is the side of your body between the ribs and the hip. These symptoms may represent a serious problem that is an emergency. Do not wait to see if the symptoms will go away. Get medical help right away. Call your local emergency services (911 in the [place] Do not drive yourself to the  hospital. Summary  Benign prostatic hyperplasia (BPH) is an enlarged prostate that is caused by the normal aging process and not by cancer.  An enlarged prostate can press on the urethra. This can make it hard to pass urine.  This condition is part of a normal aging process and is more likely to develop in men over the age of 33 years.  Get help right away if you suddenly cannot urinate. This information is not intended to replace advice given to you by your health care provider. Make sure you discuss any questions you have with your health care provider. Document Revised: 01/14/2018 Document Reviewed: 03/26/2016 Elsevier Patient Education  2020 [employer].

## 2020-02-11 NOTE — Transfer of Care (Signed)
Immediate Anesthesia Transfer of Care Note  Patient: Luke Rojas  Procedure(s) Performed: CYSTOSCOPY WITH INSERTION OF UROLIFT (N/A )  Patient Location: PACU  Anesthesia Type:General  Level of Consciousness: sedated  Airway & Oxygen Therapy: Patient Spontanous Breathing and Patient connected to face mask oxygen  Post-op Assessment: Report given to RN  Post vital signs: Reviewed and stable  Last Vitals:  Vitals Value Taken Time  BP 110/68 02/11/20 1114  Temp    Pulse 45 02/11/20 1116  Resp 15 02/11/20 1116  SpO2 100 % 02/11/20 1116  Vitals shown include unvalidated device data.  Last Pain:  Vitals:   02/11/20 0904  TempSrc: Oral  PainSc: 0-No pain         Complications: No complications documented.

## 2020-02-11 NOTE — Anesthesia Procedure Notes (Signed)
Procedure Name: LMA Insertion Performed by: Gaynelle Cage, CRNA Pre-anesthesia Checklist: Patient identified, Emergency Drugs available, Suction available and Patient being monitored Patient Re-evaluated:Patient Re-evaluated prior to induction Oxygen Delivery Method: Circle system utilized Preoxygenation: Pre-oxygenation with 100% oxygen Induction Type: IV induction LMA: LMA inserted LMA Size: 5.0 Tube type: Oral Number of attempts: 1 Placement Confirmation: positive ETCO2 and breath sounds checked- equal and bilateral Dental Injury: Teeth and Oropharynx as per pre-operative assessment

## 2020-02-15 NOTE — Anesthesia Postprocedure Evaluation (Signed)
Anesthesia Post Note  Patient: Luke Rojas  Procedure(s) Performed: CYSTOSCOPY WITH INSERTION OF UROLIFT (N/A )  Patient location during evaluation: PACU Anesthesia Type: General Level of consciousness: awake and alert Pain management: pain level controlled Vital Signs Assessment: post-procedure vital signs reviewed and stable Respiratory status: spontaneous breathing, nonlabored ventilation, respiratory function stable and patient connected to nasal cannula oxygen Cardiovascular status: blood pressure returned to baseline and stable Postop Assessment: no apparent nausea or vomiting Anesthetic complications: no   No complications documented.   Last Vitals:  Vitals:   02/11/20 1145 02/11/20 1155  BP: 120/64 128/81  Pulse: (!) 53 (!) 57  Resp: 12 16  Temp:  (!) 36.1 C  SpO2: 100% 99%    Last Pain:  Vitals:   02/12/20 0819  TempSrc:   PainSc: [place]

## 2020-04-06 NOTE — Progress Notes (Addendum)
Subjective:   Luke Rojas is a 75 y.o. male who presents for Medicare Annual/Subsequent preventive examination.  I connected with Luke Rojas today by telephone and verified that I am speaking with the correct person using two identifiers. Location patient: home Location provider: work Persons participating in the virtual visit: patient, provider.   I discussed the limitations, risks, security and privacy concerns of performing an evaluation and management service by telephone and the availability of in person appointments. I also discussed with the patient that there may be a patient responsible charge related to this service. The patient expressed understanding and verbally consented to this telephonic visit.    Interactive audio and video telecommunications were attempted between this provider and patient, however failed, due to patient having technical difficulties OR patient did not have access to video capability.  We continued and completed visit with audio only.   Review of Systems    N/A  Cardiac Risk Factors include: advanced age (>38men, >32 women);diabetes mellitus;dyslipidemia;hypertension;male gender     Objective:    There were no vitals filed for this visit. There is no height or weight on file to calculate BMI.  Advanced Directives 04/07/2020 02/11/2020 02/02/2020 04/13/2019 04/02/2019 01/16/2019 12/05/2018  Does Patient Have a Medical Advance Directive? No No No No No No No  Would patient like information on creating a medical advance directive? No - Patient declined No - Patient declined No - Patient declined - No - Patient declined - No - Patient declined    Current Medications (verified) Outpatient Encounter Medications as of 04/07/2020  Medication Sig   amLODipine (NORVASC) 5 MG tablet Take 5 mg by mouth daily.    atorvastatin (LIPITOR) 80 MG tablet Take 1 tablet (80 mg total) by mouth at bedtime.   clopidogrel (PLAVIX) 75 MG tablet Take 75 mg by mouth  daily.   co-enzyme Q-10 30 MG capsule Take 30 mg by mouth daily.    diclofenac sodium (VOLTAREN) 1 % GEL Apply 2 g topically 4 (four) times daily.   Krill Oil 1000 MG CAPS Take 1,000 mg by mouth daily.    metoprolol tartrate (LOPRESSOR) 25 MG tablet Take 1 tablet (25 mg total) by mouth 2 (two) times daily.   MULTIPLE VITAMIN PO Take 1 tablet by mouth daily.   nitroGLYCERIN (NITROSTAT) 0.4 MG SL tablet Place 1 tablet (0.4 mg total) under the tongue every 5 (five) minutes as needed for chest pain.   pantoprazole (PROTONIX) 40 MG tablet Take 40 mg by mouth daily.   ramipril (ALTACE) 10 MG capsule Take 1 capsule (10 mg total) by mouth daily.   tamsulosin (FLOMAX) 0.4 MG CAPS capsule Take 0.4 mg by mouth daily.   baclofen (LIORESAL) 10 MG tablet Take 1 tablet (10 mg total) by mouth 3 (three) times daily. (Patient not taking: No sig reported)   ciprofloxacin (CIPRO) 500 MG tablet Take 1 tablet (500 mg total) by mouth 2 (two) times daily. (Patient not taking: Reported on 04/07/2020)   finasteride (PROSCAR) 5 MG tablet Take 5 mg by mouth daily.  (Patient not taking: No sig reported)   isosorbide mononitrate (IMDUR) 60 MG 24 hr tablet Take 60 mg by mouth daily. (Patient not taking: No sig reported)   Meth-Hyo-M Bl-Na Phos-Ph Sal (URIBEL) 118 MG CAPS Take 1 capsule (118 mg total) by mouth every 6 (six) hours as needed (dysuria). (Patient not taking: Reported on 04/07/2020)   methocarbamol (ROBAXIN) 500 MG tablet Take 1 tablet (500 mg total) by mouth  every 6 (six) hours as needed for muscle spasms. (Patient not taking: No sig reported)   Omega-3 Fatty Acids (FISH OIL) 1200 MG CAPS Take 1 capsule by mouth daily.  (Patient not taking: No sig reported)   predniSONE (STERAPRED UNI-PAK 21 TAB) 10 MG (21) TBPK tablet 6 day taper; take as directed on packaged instructions (Patient not taking: No sig reported)   sulfamethoxazole-trimethoprim (BACTRIM DS) 800-160 MG tablet Take 1 tablet by mouth 2 (two) times daily.  (Patient not taking: No sig reported)   No facility-administered encounter medications on file as of 04/07/2020.    Allergies (verified) Codeine   History: Past Medical History:  Diagnosis Date   Arthritis    OSTEOARTHRITIS   Chronic kidney disease    kidney stones   COPD (chronic obstructive pulmonary disease) (HCC)    Coronary artery disease    2 stents    Diabetes mellitus without complication (HCC)    GERD (gastroesophageal reflux disease)    Hypercholesteremia    Hypertension    Myocardial infarction [facility]) 3825   Renal colic    Past Surgical History:  Procedure Laterality Date   CARDIAC CATHETERIZATION  2013   2 stents   CATARACT EXTRACTION, BILATERAL     COLONOSCOPY  04/27/05   COLONOSCOPY WITH PROPOFOL N/A 06/08/2015   Procedure: COLONOSCOPY WITH PROPOFOL;  Surgeon: Robert Bellow, MD;  Location: [facility];  Service: Endoscopy;  Laterality: N/A;   CYSTOSCOPY WITH INSERTION OF UROLIFT N/A 02/11/2020   Procedure: CYSTOSCOPY WITH INSERTION OF UROLIFT;  Surgeon: Royston Cowper, MD;  Location: [facility];  Service: Urology;  Laterality: N/A;   CYSTOSCOPY WITH STENT PLACEMENT Right 09/16/2016   Procedure: CYSTOSCOPY WITH STENT PLACEMENT;  Surgeon: Royston Cowper, MD;  Location: [facility];  Service: Urology;  Laterality: Right;   EXTRACORPOREAL SHOCK WAVE LITHOTRIPSY Left 11/04/2014   has had 2 previous lithotripsies   EXTRACORPOREAL SHOCK WAVE LITHOTRIPSY Left 03/24/2015   Procedure: EXTRACORPOREAL SHOCK WAVE LITHOTRIPSY (ESWL);  Surgeon: Royston Cowper, MD;  Location: [facility];  Service: Urology;  Laterality: Left;   EXTRACORPOREAL SHOCK WAVE LITHOTRIPSY Right 10/04/2016   Procedure: EXTRACORPOREAL SHOCK WAVE LITHOTRIPSY (ESWL);  Surgeon: Royston Cowper, MD;  Location: [facility];  Service: Urology;  Laterality: Right;   LEFT HEART CATH AND CORONARY ANGIOGRAPHY N/A 12/05/2018   Procedure: LEFT HEART CATH AND CORONARY ANGIOGRAPHY with possible pci and stent;  Surgeon:  Yolonda Kida, MD;  Location: [facility];  Service: Cardiovascular;  Laterality: N/A;   Family History  Problem Relation Age of Onset   Pulmonary embolism Mother    Transient ischemic attack Mother    Diabetes Mother    Pancreatic cancer Father    Hypertension Father    Diabetes Father    Cirrhosis Brother    Social History   Socioeconomic History   Marital status: Married    Spouse name: Not on file   Number of children: 2   Years of education: Not on file   Highest education level: 8th grade  Occupational History   Occupation: retired  Tobacco Use   Smoking status: Former Smoker    Packs/day: 1.00    Years: 30.00    Pack years: 30.00    Types: Cigarettes    Quit date: 03/05/1984    Years since quitting: 36.1   Smokeless tobacco: Current User    Types: Chew  Vaping Use   Vaping Use: Never used  Substance and Sexual Activity  Alcohol use: No    Alcohol/week: 0.0 standard drinks   Drug use: No   Sexual activity: Not on file  Other Topics Concern   Not on file  Social History Narrative   Not on file   Social Determinants of Health   Financial Resource Strain: Low Risk    Difficulty of Paying Living Expenses: Not hard at all  Food Insecurity: No Food Insecurity   Worried About Charity fundraiser in the Last Year: Never true   [place] in the Last Year: Never true  Transportation Needs: No Transportation Needs   Lack of Transportation (Medical): No   Lack of Transportation (Non-Medical): No  Physical Activity: Inactive   Days of Exercise per Week: 0 days   Minutes of Exercise per Session: 0 min  Stress: No Stress Concern Present   Feeling of Stress : Not at all  Social Connections: Moderately Integrated   Frequency of Communication with Friends and Family: More than three times a week   Frequency of Social Gatherings with Friends and Family: More than three times a week   Attends Religious Services: More than 4 times per year   Active  Member of [employer] or Organizations: No   Attends Music therapist: Never   Marital Status: Married    Tobacco Counseling Ready to quit: Not Answered Counseling given: Not Answered   Clinical Intake:  Pre-visit preparation completed: Yes  Pain : No/denies pain     Nutritional Risks: None Diabetes: Yes  How often do you need to have someone help you when you read instructions, pamphlets, or other written materials from your doctor or pharmacy?: 1 - Never  Diabetic? Yes  Nutrition Risk Assessment:  Has the patient had any N/V/D within the last 2 months?  No  Does the patient have any non-healing wounds?  No  Has the patient had any unintentional weight loss or weight gain?  No   Diabetes:  Is the patient diabetic?  Yes  If diabetic, was a CBG obtained today?  No  Did the patient bring in their glucometer from home?  No  How often do you monitor your CBG's? Every other day.   Financial Strains and Diabetes Management:  Are you having any financial strains with the device, your supplies or your medication? No .  Does the patient want to be seen by Chronic Care Management for management of their diabetes?  No  Would the patient like to be referred to a Nutritionist or for Diabetic Management?  No   Diabetic Exams:  Diabetic Eye Exam: Completed 11/03/19 Diabetic Foot Exam: Overdue, Pt has been advised about the importance in completing this exam.    Interpreter Needed?: No  Information entered by :: [facility], LPN   Activities of Daily Living In your present state of health, do you have any difficulty performing the following activities: 04/07/2020 02/02/2020  Hearing? N N  Vision? N N  Difficulty concentrating or making decisions? N N  Walking or climbing stairs? N N  Dressing or bathing? N N  Doing errands, shopping? N N  Preparing Food and eating ? N -  Using the Toilet? N -  In the past six months, have you accidently leaked urine? N -  Do you  have problems with loss of bowel control? N -  Managing your Medications? N -  Managing your Finances? N -  Housekeeping or managing your Housekeeping? N -  Some recent data might be hidden  Patient Care Team: Chrismon, Driscilla Grammes as PCP - General (Physician Assistant) Thelma Comp, [place] as Consulting Physician (Optometry) Royston Cowper, MD as Consulting Physician (Urology) Yolonda Kida, MD as Consulting Physician (Cardiology)  Indicate any recent Medical Services you may have received from other than [facility] providers in the past year (date may be approximate).     Assessment:   This is a routine wellness examination for [place].  Hearing/Vision screen No exam data present  Dietary issues and exercise activities discussed: Current Exercise Habits: The patient does not participate in regular exercise at present, Exercise limited by: None identified  Goals      Exercise 3x per week (30 min per time)     Recommend to exercise for 3 days a week for at least 30 minutes at a time.      Increase water intake     Recommend to increase water intake to 6-8 8 oz glasses a day.        Depression Screen PHQ 2/9 Scores 04/07/2020 04/02/2019 04/02/2019 02/13/2019 01/09/2018 12/19/2017 11/22/2017  PHQ - 2 Score 0 0 0 4 0 0 0  PHQ- 9 Score - - - 6 - - -    Fall Risk Fall Risk  04/07/2020 04/02/2019 02/13/2019 01/09/2018 12/19/2017  Falls in the past year? 0 0 0 0 No  Comment - - - - -  Number falls in past yr: 0 0 0 - -  Injury with Fall? 0 0 0 - -  Comment - - - - -  Follow up - - Falls evaluation completed - -    FALL RISK PREVENTION PERTAINING TO THE HOME:  Any stairs in or around the home? Yes If so, are there any without handrails? No  Home free of loose throw rugs in walkways, pet beds, electrical cords, etc? Yes  Adequate lighting in your home to reduce risk of falls? Yes   ASSISTIVE DEVICES UTILIZED TO PREVENT FALLS:  Life alert? Yes  Use of a cane, walker or  w/c? No  Grab bars in the bathroom? Yes  Shower chair or bench in shower? Yes  Elevated toilet seat or a handicapped toilet? Yes    Cognitive Function: Normal cognitive status assessed by observation by this Nurse Health Advisor. No abnormalities found.          Immunizations Immunization History  Administered Date(s) Administered   Fluad Quad(high Dose 65+) 12/31/2018   Influenza Split 03/10/2010, 03/11/2012   Influenza, High Dose Seasonal PF 01/07/2014, 10/31/2015, 10/30/2016, 11/22/2017, 12/05/2019   Influenza,inj,Quad PF,6+ Mos 12/09/2012   Moderna Sars-Covid-2 Vaccination 07/22/2019, 08/22/2019, 02/18/2020   Pneumococcal Conjugate-13 01/07/2014   Pneumococcal Polysaccharide-23 03/31/2015   Tdap 09/23/2009   Zoster 04/02/2011    TDAP status: Due, Education has been provided regarding the importance of this vaccine. Advised may receive this vaccine at local pharmacy or Health Dept. Aware to provide a copy of the vaccination record if obtained from local pharmacy or Health Dept. Verbalized acceptance and understanding.  Flu Vaccine status: Up to date  Pneumococcal vaccine status: Up to date  Covid-19 vaccine status: Completed vaccines  Qualifies for Shingles Vaccine? Yes   Zostavax completed Yes   Shingrix Completed?: No.    Education has been provided regarding the importance of this vaccine. Patient has been advised to call insurance company to determine out of pocket expense if they have not yet received this vaccine. Advised may also receive vaccine at local pharmacy or Health Dept. Verbalized acceptance and  understanding.  Screening Tests Health Maintenance  Topic Date Due   FOOT EXAM  11/27/2019   TETANUS/TDAP  04/07/2021 (Originally 09/24/2019)   HEMOGLOBIN A1C  06/03/2020   COVID-19 Vaccine (4 - Booster for Moderna series) 08/18/2020   OPHTHALMOLOGY EXAM  11/02/2020   COLONOSCOPY (Pts 45-12yrs Insurance coverage will need to be confirmed)  06/07/2025    INFLUENZA VACCINE  Completed   Hepatitis C Screening  Completed   PNA vac Low Risk Adult  Completed    Health Maintenance  Health Maintenance Due  Topic Date Due   FOOT EXAM  11/27/2019    Colorectal cancer screening: Type of screening: Colonoscopy. Completed 06/08/15. Repeat every 10 years  Lung Cancer Screening: (Low Dose CT Chest recommended if Age 39-80 years, 30 pack-year currently smoking OR have quit w/in 15years.) does not qualify.    Additional Screening:  Hepatitis C Screening: Up to date  Vision Screening: Recommended annual ophthalmology exams for early detection of glaucoma and other disorders of the eye. Is the patient up to date with their annual eye exam?  Yes  Who is the provider or what is the name of the office in which the patient attends annual eye exams? Dr Rick Duff If pt is not established with a provider, would they like to be referred to a provider to establish care? No .   Dental Screening: Recommended annual dental exams for proper oral hygiene  Community Resource Referral / Chronic Care Management: CRR required this visit?  No   CCM required this visit?  No      Plan:     I have personally reviewed and noted the following in the patient's chart:   Medical and social history Use of alcohol, tobacco or illicit drugs  Current medications and supplements Functional ability and status Nutritional status Physical activity Advanced directives List of other physicians Hospitalizations, surgeries, and ER visits in previous 12 months Vitals Screenings to include cognitive, depression, and falls Referrals and appointments  In addition, I have reviewed and discussed with patient certain preventive protocols, quality metrics, and best practice recommendations. A written personalized care plan for preventive services as well as general preventive health recommendations were provided to patient.     Jaquin Coy [place], [place]   10/06/4194   Nurse  Notes: Pt needs a diabetic foot exam at next in office apt.   Reviewed screening note and recommendations of Nurse Health Advisor. Agree with documentation and plan.

## 2020-04-07 ENCOUNTER — Other Ambulatory Visit: Payer: Self-pay

## 2020-04-07 ENCOUNTER — Ambulatory Visit (INDEPENDENT_AMBULATORY_CARE_PROVIDER_SITE_OTHER): Payer: Medicare Other

## 2020-04-07 DIAGNOSIS — Z0000 Encounter for general adult medical examination without abnormal findings: Secondary | ICD-10-CM

## 2020-04-07 NOTE — Patient Instructions (Signed)
Luke Rojas , Thank you for taking time to come for your Medicare Wellness Visit. I appreciate your ongoing commitment to your health goals. Please review the following plan we discussed and let me know if I can assist you in the future.   Screening recommendations/referrals: Colonoscopy: Up to date, due 06/2025 Recommended yearly ophthalmology/optometry visit for glaucoma screening and checkup Recommended yearly dental visit for hygiene and checkup  Vaccinations: Influenza vaccine: Done 12/2019 Pneumococcal vaccine: Completed series Tdap vaccine: Currently due, declined receiving.  Shingles vaccine: Shingrix discussed. Please contact your pharmacy for coverage information.     Advanced directives: Advance directive discussed with you today. Even though you declined this today please call our office should you change your mind and we can give you the proper paperwork for you to fill out.  Conditions/risks identified: Recommend to exercise for 3 days a week for at least 30 minutes at a time. Also recommend to increase water intake to 6-8 8 oz glasses a day.   Next appointment: 06/14/20 @ 10:40 AM with [place] 65 Years and Older, Male Preventive care refers to lifestyle choices and visits with your health care provider that can promote health and wellness. What does preventive care include?  A yearly physical exam. This is also called an annual well check.  Dental exams once or twice a year.  Routine eye exams. Ask your health care provider how often you should have your eyes checked.  Personal lifestyle choices, including:  Daily care of your teeth and gums.  Regular physical activity.  Eating a healthy diet.  Avoiding tobacco and drug use.  Limiting alcohol use.  Practicing safe sex.  Taking low doses of aspirin every day.  Taking vitamin and mineral supplements as recommended by your health care provider. What happens during an annual well  check? The services and screenings done by your health care provider during your annual well check will depend on your age, overall health, lifestyle risk factors, and family history of disease. Counseling  Your health care provider may ask you questions about your:  Alcohol use.  Tobacco use.  Drug use.  Emotional well-being.  Home and relationship well-being.  Sexual activity.  Eating habits.  History of falls.  Memory and ability to understand (cognition).  Work and work Statistician. Screening  You may have the following tests or measurements:  Height, weight, and BMI.  Blood pressure.  Lipid and cholesterol levels. These may be checked every 5 years, or more frequently if you are over 3 years old.  Skin check.  Lung cancer screening. You may have this screening every year starting at age 32 if you have a 30-pack-year history of smoking and currently smoke or have quit within the past 15 years.  Fecal occult blood test (FOBT) of the stool. You may have this test every year starting at age 61.  Flexible sigmoidoscopy or colonoscopy. You may have a sigmoidoscopy every 5 years or a colonoscopy every 10 years starting at age 52.  Prostate cancer screening. Recommendations will vary depending on your family history and other risks.  Hepatitis C blood test.  Hepatitis B blood test.  Sexually transmitted disease (STD) testing.  Diabetes screening. This is done by checking your blood sugar (glucose) after you have not eaten for a while (fasting). You may have this done every 1-3 years.  Abdominal aortic aneurysm (AAA) screening. You may need this if you are a current or former smoker.  Osteoporosis. You may be  screened starting at age 71 if you are at high risk. Talk with your health care provider about your test results, treatment options, and if necessary, the need for more tests. Vaccines  Your health care provider may recommend certain vaccines, such  as:  Influenza vaccine. This is recommended every year.  Tetanus, diphtheria, and acellular pertussis (Tdap, Td) vaccine. You may need a Td booster every 10 years.  Zoster vaccine. You may need this after age 25.  Pneumococcal 13-valent conjugate (PCV13) vaccine. One dose is recommended after age 51.  Pneumococcal polysaccharide (PPSV23) vaccine. One dose is recommended after age 34. Talk to your health care provider about which screenings and vaccines you need and how often you need them. This information is not intended to replace advice given to you by your health care provider. Make sure you discuss any questions you have with your health care provider. Document Released: 03/18/2015 Document Revised: 11/09/2015 Document Reviewed: 12/21/2014 Elsevier Interactive Patient Education  2017 [employer] Prevention in the Home Falls can cause injuries. They can happen to people of all ages. There are many things you can do to make your home safe and to help prevent falls. What can I do on the outside of my home?  Regularly fix the edges of walkways and driveways and fix any cracks.  Remove anything that might make you trip as you walk through a door, such as a raised step or threshold.  Trim any bushes or trees on the path to your home.  Use bright outdoor lighting.  Clear any walking paths of anything that might make someone trip, such as rocks or tools.  Regularly check to see if handrails are loose or broken. Make sure that both sides of any steps have handrails.  Any raised decks and porches should have guardrails on the edges.  Have any leaves, snow, or ice cleared regularly.  Use sand or salt on walking paths during winter.  Clean up any spills in your garage right away. This includes oil or grease spills. What can I do in the bathroom?  Use night lights.  Install grab bars by the toilet and in the tub and shower. Do not use towel bars as grab bars.  Use  non-skid mats or decals in the tub or shower.  If you need to sit down in the shower, use a plastic, non-slip stool.  Keep the floor dry. Clean up any water that spills on the floor as soon as it happens.  Remove soap buildup in the tub or shower regularly.  Attach bath mats securely with double-sided non-slip rug tape.  Do not have throw rugs and other things on the floor that can make you trip. What can I do in the bedroom?  Use night lights.  Make sure that you have a light by your bed that is easy to reach.  Do not use any sheets or blankets that are too big for your bed. They should not hang down onto the floor.  Have a firm chair that has side arms. You can use this for support while you get dressed.  Do not have throw rugs and other things on the floor that can make you trip. What can I do in the kitchen?  Clean up any spills right away.  Avoid walking on wet floors.  Keep items that you use a lot in easy-to-reach places.  If you need to reach something above you, use a strong step stool that has a  grab bar.  Keep electrical cords out of the way.  Do not use floor polish or wax that makes floors slippery. If you must use wax, use non-skid floor wax.  Do not have throw rugs and other things on the floor that can make you trip. What can I do with my stairs?  Do not leave any items on the stairs.  Make sure that there are handrails on both sides of the stairs and use them. Fix handrails that are broken or loose. Make sure that handrails are as long as the stairways.  Check any carpeting to make sure that it is firmly attached to the stairs. Fix any carpet that is loose or worn.  Avoid having throw rugs at the top or bottom of the stairs. If you do have throw rugs, attach them to the floor with carpet tape.  Make sure that you have a light switch at the top of the stairs and the bottom of the stairs. If you do not have them, ask someone to add them for you. What  else can I do to help prevent falls?  Wear shoes that:  Do not have high heels.  Have rubber bottoms.  Are comfortable and fit you well.  Are closed at the toe. Do not wear sandals.  If you use a stepladder:  Make sure that it is fully opened. Do not climb a closed stepladder.  Make sure that both sides of the stepladder are locked into place.  Ask someone to hold it for you, if possible.  Clearly mark and make sure that you can see:  Any grab bars or handrails.  First and last steps.  Where the edge of each step is.  Use tools that help you move around (mobility aids) if they are needed. These include:  Canes.  Walkers.  Scooters.  Crutches.  Turn on the lights when you go into a dark area. Replace any light bulbs as soon as they burn out.  Set up your furniture so you have a clear path. Avoid moving your furniture around.  If any of your floors are uneven, fix them.  If there are any pets around you, be aware of where they are.  Review your medicines with your doctor. Some medicines can make you feel dizzy. This can increase your chance of falling. Ask your doctor what other things that you can do to help prevent falls. This information is not intended to replace advice given to you by your health care provider. Make sure you discuss any questions you have with your health care provider. Document Released: 12/16/2008 Document Revised: 07/28/2015 Document Reviewed: 03/26/2014 Elsevier Interactive Patient Education  2017 [employer].

## 2020-04-29 ENCOUNTER — Other Ambulatory Visit: Payer: Self-pay | Admitting: Family Medicine

## 2020-04-29 DIAGNOSIS — E119 Type 2 diabetes mellitus without complications: Secondary | ICD-10-CM

## 2020-04-29 NOTE — Telephone Encounter (Signed)
  Notes to clinic: Patient requesting but not on current list  Review for refill    Requested Prescriptions  Pending Prescriptions Disp [place] 75O MISC [Pharmacy Med Name: ONE TOUCH DELICA 83G EA] 549 each 4    Sig: TEST FASTING GLUCOSE LEVEL [place]      Endocrinology: Diabetes - Testing Supplies Passed - 04/29/2020 11:45 AM      Passed - Valid encounter within last 12 months    Recent Outpatient Visits           3 months ago Keratotic lesion   [place], Vickki Muff, PA-C   4 months ago UTI (urinary tract infection), bacterial   [place], PA-C   4 months ago Chronic left-sided low back pain without sciatica   [place], PA-C   5 months ago Acute left-sided low back pain without sciatica   [place], PA-C   7 months ago Pharyngitis, unspecified etiology   [employer], Vickki Muff, PA-C       Future Appointments             In 1 month Chrismon, Vickki Muff, PA-C [employer], [place]

## 2020-05-04 ENCOUNTER — Ambulatory Visit: Payer: Medicare Other

## 2020-06-08 ENCOUNTER — Ambulatory Visit (HOSPITAL_COMMUNITY): Payer: Medicare Other

## 2020-06-08 ENCOUNTER — Telehealth: Payer: Self-pay | Admitting: Family Medicine

## 2020-06-08 ENCOUNTER — Other Ambulatory Visit: Payer: Self-pay

## 2020-06-08 ENCOUNTER — Ambulatory Visit (INDEPENDENT_AMBULATORY_CARE_PROVIDER_SITE_OTHER): Payer: Medicare Other

## 2020-06-08 ENCOUNTER — Encounter: Payer: Self-pay | Admitting: Emergency Medicine

## 2020-06-08 ENCOUNTER — Telehealth (HOSPITAL_COMMUNITY): Payer: Self-pay | Admitting: Emergency Medicine

## 2020-06-08 ENCOUNTER — Ambulatory Visit
Admission: EM | Admit: 2020-06-08 | Discharge: 2020-06-08 | Disposition: A | Discharge status: Home / Self Care | Payer: Medicare Other | Attending: Family Medicine | Admitting: Family Medicine

## 2020-06-08 DIAGNOSIS — J22 Unspecified acute lower respiratory infection: Secondary | ICD-10-CM | POA: Diagnosis not present

## 2020-06-08 DIAGNOSIS — J441 Chronic obstructive pulmonary disease with (acute) exacerbation: Secondary | ICD-10-CM

## 2020-06-08 MED ORDER — DOXYCYCLINE HYCLATE 100 MG PO CAPS: 100.0000 mg | ORAL_CAPSULE | Freq: Two times a day (BID) | ORAL | 0 refills | Status: DC

## 2020-06-08 MED ORDER — PREDNISONE 20 MG PO TABS: 20.0000 mg | ORAL_TABLET | Freq: Every day | ORAL | 0 refills | Status: AC

## 2020-06-08 MED ORDER — DEXTROMETHORPHAN-GUAIFENESIN 10-100 MG/5ML PO LIQD: 5.0000 mL | Freq: Four times a day (QID) | ORAL | 0 refills | Status: DC | PRN

## 2020-06-08 NOTE — Telephone Encounter (Signed)
Reviewed provider notes in previous telephone encounter for today.   Attempted to reach patient x 1, LVM

## 2020-06-08 NOTE — ED Provider Notes (Signed)
[facility]    CSN: [id number] Arrival date & time: 06/08/20  1014      History   Chief Complaint Chief Complaint  Patient presents with  . Nasal Congestion  . Cough    HPI Luke Rojas is a 75 y.o. male.   HPI  Patient presents today with nasal congestion, persistent non productive cough, and chest congestion.  Afebrile.  Patient has a history of type 2 diabetes, CAD with stent placement, and COPD.  Patient is a current daily smoker. He has been taken over-the-counter cough and cold medication without relief.  Endorses mild shortness of breath with some chest tightness with coughing earlier today. Past Medical History:  Diagnosis Date  . Arthritis    OSTEOARTHRITIS  . Chronic kidney disease    kidney stones  . COPD (chronic obstructive pulmonary disease) ([place])   . Coronary artery disease    2 stents   . Diabetes mellitus without complication ([place])   . GERD (gastroesophageal reflux disease)   . Hypercholesteremia   . Hypertension   . Myocardial infarction ([place]) 2013  . Renal colic     Patient Active Problem List   Diagnosis Date Noted  . Diabetes ([place]) 12/04/2018  . Chest pain 11/24/2018  . Coronary artery disease involving native coronary artery without angina pectoris 08/05/2017  . Lumbar radiculopathy (R-sided) L4 07/04/2017  . 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 Lumbar facet arthropathy 07/04/2017  . 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 Foraminal stenosis of thoracic region 07/04/2017  . Chronic pain syndrome 07/04/2017  . Encounter for screening colonoscopy 05/04/2015  . Arthritis 10/08/2014  . H/O acute myocardial infarction 10/08/2014  . Acid reflux 10/08/2014  . HLD (hyperlipidemia) 10/08/2014  . Benign neoplasm of skin 07/19/2006  . Essential (primary) hypertension 11/15/2005  . Decreased potassium in the blood 11/15/2005    Past Surgical History:  Procedure Laterality Date  . CARDIAC  CATHETERIZATION  2013   2 stents  . CATARACT EXTRACTION, BILATERAL    . COLONOSCOPY  04/27/05  . COLONOSCOPY WITH PROPOFOL N/A 06/08/2015   Procedure: COLONOSCOPY WITH PROPOFOL;  Surgeon: Robert Bellow, MD;  Location: [facility] ENDOSCOPY;  Service: Endoscopy;  Laterality: N/A;  . CYSTOSCOPY WITH INSERTION OF UROLIFT N/A 02/11/2020   Procedure: CYSTOSCOPY WITH INSERTION OF UROLIFT;  Surgeon: Royston Cowper, MD;  Location: [facility] ORS;  Service: Urology;  Laterality: N/A;  . CYSTOSCOPY WITH STENT PLACEMENT Right 09/16/2016   Procedure: CYSTOSCOPY WITH STENT PLACEMENT;  Surgeon: Royston Cowper, MD;  Location: [facility] ORS;  Service: Urology;  Laterality: Right;  . EXTRACORPOREAL SHOCK WAVE LITHOTRIPSY Left 11/04/2014   has had 2 previous lithotripsies  . EXTRACORPOREAL SHOCK WAVE LITHOTRIPSY Left 03/24/2015   Procedure: EXTRACORPOREAL SHOCK WAVE LITHOTRIPSY (ESWL);  Surgeon: Royston Cowper, MD;  Location: [facility] ORS;  Service: Urology;  Laterality: Left;  . EXTRACORPOREAL SHOCK WAVE LITHOTRIPSY Right 10/04/2016   Procedure: EXTRACORPOREAL SHOCK WAVE LITHOTRIPSY (ESWL);  Surgeon: Royston Cowper, MD;  Location: [facility] ORS;  Service: Urology;  Laterality: Right;  . LEFT HEART CATH AND CORONARY ANGIOGRAPHY N/A 12/05/2018   Procedure: LEFT HEART CATH AND CORONARY ANGIOGRAPHY with possible pci and stent;  Surgeon: Yolonda Kida, MD;  Location: [facility];  Service: Cardiovascular;  Laterality: N/A;       Home Medications    Prior to Admission medications   Medication Sig Start Date End Date Taking? Authorizing Provider  atorvastatin (LIPITOR) 80 MG tablet  Take 1 tablet (80 mg total) by mouth at bedtime. 12/07/19  Yes Chrismon, Vickki Muff, PA-C  clopidogrel (PLAVIX) 75 MG tablet Take 75 mg by mouth daily.   Yes [provider]  co-enzyme Q-10 30 MG capsule Take 30 mg by mouth daily.    Yes [provider]  Javier Docker Oil 1000 MG CAPS Take 1,000 mg by mouth daily.    Yes [provider]  metoprolol tartrate (LOPRESSOR) 25 MG tablet Take 1 tablet (25 mg total) by mouth 2 (two) times daily. 11/25/18  Yes Gladstone Lighter, MD  ramipril (ALTACE) 10 MG capsule Take 1 capsule (10 mg total) by mouth daily. 11/25/18  Yes Gladstone Lighter, MD  amLODipine (NORVASC) 5 MG tablet Take 5 mg by mouth daily.  03/10/18 04/07/20  [provider]  baclofen (LIORESAL) 10 MG tablet Take 1 tablet (10 mg total) by mouth 3 (three) times daily. Patient not taking: No sig reported 12/01/19   Chrismon, Vickki Muff, PA-C  ciprofloxacin (CIPRO) 500 MG tablet Take 1 tablet (500 mg total) by mouth 2 (two) times daily. Patient not taking: No sig reported 02/11/20   Royston Cowper, MD  diclofenac sodium (VOLTAREN) 1 % GEL Apply 2 g topically 4 (four) times daily. 07/08/17   Chrismon, Vickki Muff, PA-C  finasteride (PROSCAR) 5 MG tablet Take 5 mg by mouth daily.  Patient not taking: No sig reported 06/26/16   [provider]  isosorbide mononitrate (IMDUR) 60 MG 24 hr tablet Take 60 mg by mouth daily. Patient not taking: No sig reported    [provider]  Meth-Hyo-M Bl-Na Phos-Ph Sal (URIBEL) 118 MG CAPS Take 1 capsule (118 mg total) by mouth every 6 (six) hours as needed (dysuria). Patient not taking: No sig reported 02/11/20   Royston Cowper, MD  methocarbamol (ROBAXIN) 500 MG tablet Take 1 tablet (500 mg total) by mouth every 6 (six) hours as needed for muscle spasms. Patient not taking: No sig reported 12/08/19   Chrismon, Vickki Muff, PA-C  MULTIPLE VITAMIN PO Take 1 tablet by mouth daily.    [provider]  nitroGLYCERIN (NITROSTAT) 0.4 MG SL tablet Place 1 tablet (0.4 mg total) under the tongue every 5 (five) minutes as needed for chest pain. 11/25/18   Gladstone Lighter, MD  Omega-3 Fatty Acids (FISH OIL) 1200 MG CAPS Take 1 capsule by mouth daily.  Patient not taking: No sig reported    [provider]  OneTouch Delica Lancets 62V MISC TEST FASTING  GLUCOSE LEVEL EACH MORNING BEFORE BREAKFAST 04/29/20   Jerrol Banana., MD  pantoprazole (PROTONIX) 40 MG tablet Take 40 mg by mouth daily. 01/21/19   [provider]  predniSONE (STERAPRED UNI-PAK 21 TAB) 10 MG (21) TBPK tablet 6 day taper; take as directed on packaged instructions Patient not taking: No sig reported 12/01/19   Chrismon, Simona Huh E, PA-C  sulfamethoxazole-trimethoprim (BACTRIM DS) 800-160 MG tablet Take 1 tablet by mouth 2 (two) times daily. Patient not taking: No sig reported 12/08/19   Chrismon, Vickki Muff, PA-C  tamsulosin (FLOMAX) 0.4 MG CAPS capsule Take 0.4 mg by mouth daily. 02/12/19   [provider]    Family History Family History  Problem Relation Age of Onset  . Pulmonary embolism Mother   . Transient ischemic attack Mother   . Diabetes Mother   . Pancreatic cancer Father   . Hypertension Father   . Diabetes Father   . Cirrhosis Brother  Social History Social History   Tobacco Use  . Smoking status: Former Smoker    Packs/day: 1.00    Years: 30.00    Pack years: 30.00    Types: Cigarettes    Quit date: 03/05/1984    Years since quitting: 36.2  . Smokeless tobacco: Current User    Types: Chew  Vaping Use  . Vaping Use: Never used  Substance Use Topics  . Alcohol use: No    Alcohol/week: 0.0 standard drinks  . Drug use: No     Allergies   Codeine   Review of Systems Review of Systems Pertinent negatives listed in HPI   Physical Exam Triage Vital Signs ED Triage Vitals  Enc Vitals Group     BP 06/08/20 1025 113/67     Pulse Rate 06/08/20 1025 63     Resp 06/08/20 1025 18     Temp 06/08/20 1025 98.4 F (36.9 C)     Temp Source 06/08/20 1025 Oral     SpO2 06/08/20 1025 94 %     Weight --      Height --      Head Circumference --      Peak Flow --      Pain Score 06/08/20 1031 0     Pain Loc --      Pain Edu? --      Excl. in [place]? --    No data found.  Updated Vital Signs BP 113/67 (BP Location: Left  Arm)   Pulse 63   Temp 98.4 F (36.9 C) (Oral)   Resp 18   SpO2 94%   Visual Acuity Right Eye Distance:   Left Eye Distance:   Bilateral Distance:    Right Eye Near:   Left Eye Near:    Bilateral Near:     Physical Exam Constitutional:      General: He is not in acute distress.    Appearance: He is ill-appearing. He is not toxic-appearing.  HENT:     Nose: Congestion and rhinorrhea present.     Mouth/Throat:     Mouth: Mucous membranes are dry.     Pharynx: Oropharynx is clear.  Cardiovascular:     Rate and Rhythm: Normal rate and regular rhythm.  Pulmonary:     Effort: Pulmonary effort is normal.     Breath sounds: Rhonchi present. No wheezing.     Comments: Generalized diminished lung sounds  Musculoskeletal:     Cervical back: Normal range of motion. No rigidity.  Lymphadenopathy:     Cervical: No cervical adenopathy.  Skin:    General: Skin is warm.     Capillary Refill: Capillary refill takes less than 2 seconds.  Neurological:     General: No focal deficit present.     Mental Status: He is alert and oriented to person, place, and time.      UC Treatments / Results  Labs (all labs ordered are listed, but only abnormal results are displayed) Labs Reviewed - No data to display  EKG   Radiology DG Chest 2 View  Result Date: 06/08/2020 CLINICAL DATA:  Persistent cough. Chest tightness. History of COPD. EXAM: CHEST - 2 VIEW COMPARISON:  04/13/2019.  11/24/2018. FINDINGS: Mediastinum and hilar structures normal. Increased density noted over the right mid lung. This is in a site of a previously identified pleuroparenchymal thickening consistent scarring. Findings suggest superimposed infiltrate. Follow-up exam suggested to demonstrate resolution in order to exclude underlying mass. COPD. Bilateral pleural-parenchymal thickening consistent  scarring again noted. No pleural effusion or pneumothorax. Heart size normal. Degenerative change thoracic spine. No acute bony  abnormality. IMPRESSION: 1. Increased density noted over right mid lung. This is in the site of previously identified pleuroparenchymal scarring. Findings suggest superimposed infiltrate. Follow-up exam suggested to demonstrate resolution in order to exclude underlying mass. 2.  COPD.  Bilateral pleural-parenchymal scarring again noted. Electronically Signed   By: Marcello Moores  Register   On: 06/08/2020 11:07    Procedures Procedures (including critical care time)  Medications Ordered in UC Medications - No data to display  Initial Impression / Assessment and Plan / UC Course  I have reviewed the triage vital signs and the nursing notes.  Pertinent labs & imaging results that were available during my care of the patient were reviewed by me and considered in my medical decision making (see chart for details).    Imaging reviewed concern for possible right middle lobe pneumonia. Radiology over-read pending. Covering for pneumonia with Doxycyline 100 mg twice daily x 10 days.   Management of cough Guaifenesin. Prednisone 20 mg daily x 5 days. ER precaution given. Final radiology impression of chest x-ray revealed right middle lobe opacity seen previously on imaging will need re-evaluation to ensure resolution. Will have nurse notify patient of imaging findings and the need to follow-up with primary care provided at upcoming appointment for re imaging to ensure resolution of RML opacities. Final Clinical Impressions(s) / UC Diagnoses   Final diagnoses:  Lower respiratory infection (e.g., bronchitis, pneumonia, pneumonitis, pulmonitis)  COPD exacerbation ([place])     Discharge Instructions     If you develop any severe shortness of breath or worsening chest tightness go immediately to the emergency department. Take all medications as prescribed.  Schedule follow-up with your primary care provider within the next 2 weeks for reevaluation of symptoms to ensure resolution of current illness.    ED  Prescriptions    Medication Sig Dispense Auth. Provider   doxycycline (VIBRAMYCIN) 100 MG capsule Take 1 capsule (100 mg total) by mouth 2 (two) times daily. 20 capsule Scot Jun, FNP   predniSONE (DELTASONE) 20 MG tablet Take 1 tablet (20 mg total) by mouth daily with breakfast for 5 days. 5 tablet Scot Jun, FNP   dextromethorphan-guaiFENesin (ROBITUSSIN-DM) 10-100 MG/5ML liquid Take 5 mLs by mouth every 6 (six) hours as needed for cough. 140 mL Scot Jun, FNP     PDMP not reviewed this encounter.   Scot Jun, FNP 06/08/20 [id number]

## 2020-06-08 NOTE — Telephone Encounter (Signed)
Xray consistent with right middle lobe pneumonia. Continue treatment. Patient will need to request PCP repeat imaging at his May appointment to ensure resolution. If his symptoms worsen or become severe, go to the emergency room. If symptoms do not improve prior to his PCP he may return here.

## 2020-06-08 NOTE — Discharge Instructions (Addendum)
If you develop any severe shortness of breath or worsening chest tightness go immediately to the emergency department. Take all medications as prescribed.  Schedule follow-up with your primary care provider within the next 2 weeks for reevaluation of symptoms to ensure resolution of current illness.

## 2020-06-08 NOTE — ED Triage Notes (Signed)
Patient c/o nasal congestion and productive cough x 2 days.   Patient denies fever at home.   Patient endorses headache yesterday.   Patient endorses " coughing up a little bit of something but I don't know what it looks like".   Patient endorses some chest discomfort this morning that has now subsided.   Patient denies any sore throat or ear pain.   Patient has used an OTC cold medication with no relief of symptoms.

## 2020-06-10 NOTE — Telephone Encounter (Signed)
Attempted to reach patient x 2, LVM

## 2020-06-14 ENCOUNTER — Encounter: Payer: Medicare Other | Admitting: Family Medicine

## 2020-07-08 NOTE — Progress Notes (Signed)
Established patient visit   Patient: Luke Rojas   DOB: May 16, 1945   75 y.o. Male  MRN: [id number] Visit Date: 07/11/2020  Today's healthcare provider: Wilhemena Durie, MD   Chief Complaint  Patient presents with  . Leg Pain   Subjective    Leg Pain  There was no injury mechanism. The pain is present in the right leg. The quality of the pain is described as aching and cramping. The pain is moderate. The pain has been constant since onset. Associated symptoms include an inability to bear weight. Pertinent negatives include no numbness or tingling. The symptoms are aggravated by movement and weight bearing. He has tried nothing for the symptoms.  The pain actually improves with walking. Patient also states gets occasionally a little lightheaded.  He has a history of CAD.        Medications: Outpatient Medications Prior to Visit  Medication Sig  . atorvastatin (LIPITOR) 80 MG tablet Take 1 tablet (80 mg total) by mouth at bedtime.  . clopidogrel (PLAVIX) 75 MG tablet Take 75 mg by mouth daily.  Marland Kitchen co-enzyme Q-10 30 MG capsule Take 30 mg by mouth daily.   . diclofenac sodium (VOLTAREN) 1 % GEL Apply 2 g topically 4 (four) times daily.  Javier Docker Oil 1000 MG CAPS Take 1,000 mg by mouth daily.   . metoprolol tartrate (LOPRESSOR) 25 MG tablet Take 1 tablet (25 mg total) by mouth 2 (two) times daily.  . MULTIPLE VITAMIN PO Take 1 tablet by mouth daily.  . nitroGLYCERIN (NITROSTAT) 0.4 MG SL tablet Place 1 tablet (0.4 mg total) under the tongue every 5 (five) minutes as needed for chest pain.  Glory Rosebush Delica Lancets 81K MISC TEST FASTING GLUCOSE LEVEL EACH MORNING BEFORE BREAKFAST  . pantoprazole (PROTONIX) 40 MG tablet Take 40 mg by mouth daily.  . ramipril (ALTACE) 10 MG capsule Take 1 capsule (10 mg total) by mouth daily.  . tamsulosin (FLOMAX) 0.4 MG CAPS capsule Take 0.4 mg by mouth daily.  Marland Kitchen amLODipine (NORVASC) 5 MG tablet Take 5 mg by mouth daily.   . baclofen  (LIORESAL) 10 MG tablet Take 1 tablet (10 mg total) by mouth 3 (three) times daily. (Patient not taking: No sig reported)  . ciprofloxacin (CIPRO) 500 MG tablet Take 1 tablet (500 mg total) by mouth 2 (two) times daily. (Patient not taking: No sig reported)  . dextromethorphan-guaiFENesin (ROBITUSSIN-DM) 10-100 MG/5ML liquid Take 5 mLs by mouth every 6 (six) hours as needed for cough. (Patient not taking: Reported on 07/11/2020)  . doxycycline (VIBRAMYCIN) 100 MG capsule Take 1 capsule (100 mg total) by mouth 2 (two) times daily. (Patient not taking: No sig reported)  . finasteride (PROSCAR) 5 MG tablet Take 5 mg by mouth daily.  (Patient not taking: No sig reported)  . isosorbide mononitrate (IMDUR) 60 MG 24 hr tablet Take 60 mg by mouth daily. (Patient not taking: No sig reported)  . Meth-Hyo-M Bl-Na Phos-Ph Sal (URIBEL) 118 MG CAPS Take 1 capsule (118 mg total) by mouth every 6 (six) hours as needed (dysuria). (Patient not taking: No sig reported)  . methocarbamol (ROBAXIN) 500 MG tablet Take 1 tablet (500 mg total) by mouth every 6 (six) hours as needed for muscle spasms. (Patient not taking: No sig reported)  . Omega-3 Fatty Acids (FISH OIL) 1200 MG CAPS Take 1 capsule by mouth daily.  (Patient not taking: No sig reported)  . sulfamethoxazole-trimethoprim (BACTRIM DS) 800-160 MG tablet  Take 1 tablet by mouth 2 (two) times daily. (Patient not taking: No sig reported)   No facility-administered medications prior to visit.    Review of Systems  Constitutional: Negative for appetite change, chills and fever.  Respiratory: Negative for chest tightness, shortness of breath and wheezing.   Cardiovascular: Negative for chest pain and palpitations.  Gastrointestinal: Negative for abdominal pain, nausea and vomiting.  Neurological: Negative for tingling and numbness.        Objective    BP 106/66   Pulse 61   Temp 97.9 F (36.6 C)   Resp 16   Ht 5\' 9"  (1.753 m)   Wt 190 lb (86.2 kg)   BMI  28.06 kg/m  BP Readings from Last 3 Encounters:  07/11/20 106/66  06/08/20 113/67  02/11/20 128/81   Wt Readings from Last 3 Encounters:  07/11/20 190 lb (86.2 kg)  02/02/20 190 lb (86.2 kg)  12/01/19 188 lb (85.3 kg)       Physical Exam Vitals reviewed.  Constitutional:      General: He is not in acute distress.    Appearance: He is well-developed.  HENT:     Head: Normocephalic and atraumatic.     Right Ear: Hearing normal.     Left Ear: Hearing normal.     Nose: Nose normal.  Eyes:     General: Lids are normal. No scleral icterus.       Right eye: No discharge.        Left eye: No discharge.     Conjunctiva/sclera: Conjunctivae normal.  Cardiovascular:     Rate and Rhythm: Normal rate and regular rhythm.     Heart sounds: Normal heart sounds.  Pulmonary:     Effort: Pulmonary effort is normal. No respiratory distress.  Abdominal:     General: Bowel sounds are normal.     Palpations: Abdomen is soft.  Musculoskeletal:     Cervical back: Neck supple.     Comments: He does have mild tenderness in the LS spine region. Full range of motion.  Skin:    Findings: No lesion or rash.  Neurological:     General: No focal deficit present.     Mental Status: He is alert and oriented to person, place, and time.     Comments: Right leg raise is negative and strength is full in both lower extremities.   Psychiatric:        Mood and Affect: Mood normal.        Speech: Speech normal.        Behavior: Behavior normal.        Thought Content: Thought content normal.        Judgment: Judgment normal.       No results found for any visits on 07/11/20.  Assessment & Plan     1. Low back pain, unspecified back pain laterality, unspecified chronicity, unspecified whether sciatica present Prednisone 10 mg 6-day taper.  Flexeril 5 mg 3 times daily as needed.  Follow-up with in 2 weeks with [facility] K[facility] X-ray of LS spine. - DG Lumbar Spine Complete; Future - predniSONE  (STERAPRED UNI-PAK 21 TAB) 10 MG (21) TBPK tablet; Taper as directed.  Dispense: 21 tablet; Refill: 0 - cyclobenzaprine (FLEXERIL) 5 MG tablet; Take 1 tablet (5 mg total) by mouth 3 (three) times daily as needed for muscle spasms.  Dispense: 30 tablet; Refill: 1  2. Right leg pain Sciatica. - DG Lumbar Spine Complete; Future - predniSONE (STERAPRED  UNI-PAK 21 TAB) 10 MG (21) TBPK tablet; Taper as directed.  Dispense: 21 tablet; Refill: 0 - cyclobenzaprine (FLEXERIL) 5 MG tablet; Take 1 tablet (5 mg total) by mouth 3 (three) times daily as needed for muscle spasms.  Dispense: 30 tablet; Refill: 1  3. Essential hypertension Ramipril from 4 g to 5 mg daily.  4. Coronary artery disease involving native coronary artery of native heart without angina pectoris All risk factors treated.   No follow-ups on file.      I, Wilhemena Durie, MD, have reviewed all documentation for this visit. The documentation on 07/15/20 for the exam, diagnosis, procedures, and orders are all accurate and complete.    Bostyn Bogie Cranford Mon, MD  [facility] [phone] (phone) [phone] (fax)  [place]

## 2020-07-11 ENCOUNTER — Ambulatory Visit
Admission: RE | Admit: 2020-07-11 | Discharge: 2020-07-11 | Disposition: A | Discharge status: Home / Self Care | Payer: Medicare Other | Source: Ambulatory Visit | Attending: Family Medicine | Admitting: Family Medicine

## 2020-07-11 ENCOUNTER — Encounter: Payer: Self-pay | Admitting: Family Medicine

## 2020-07-11 ENCOUNTER — Other Ambulatory Visit: Payer: Self-pay

## 2020-07-11 ENCOUNTER — Ambulatory Visit
Admission: RE | Admit: 2020-07-11 | Discharge: 2020-07-11 | Disposition: A | Discharge status: Home / Self Care | Payer: Medicare Other | Attending: Family Medicine | Admitting: Family Medicine

## 2020-07-11 ENCOUNTER — Ambulatory Visit (INDEPENDENT_AMBULATORY_CARE_PROVIDER_SITE_OTHER): Payer: Medicare Other | Admitting: Family Medicine

## 2020-07-11 VITALS — BP 106/66 | HR 61 | Temp 97.9°F | Resp 16 | Ht 69.0 in | Wt 190.0 lb

## 2020-07-11 DIAGNOSIS — I10 Essential (primary) hypertension: Secondary | ICD-10-CM | POA: Diagnosis not present

## 2020-07-11 DIAGNOSIS — M79604 Pain in right leg: Secondary | ICD-10-CM | POA: Diagnosis not present

## 2020-07-11 DIAGNOSIS — M545 Low back pain, unspecified: Secondary | ICD-10-CM | POA: Diagnosis present

## 2020-07-11 DIAGNOSIS — I2510 Atherosclerotic heart disease of native coronary artery without angina pectoris: Secondary | ICD-10-CM | POA: Diagnosis not present

## 2020-07-11 MED ORDER — CYCLOBENZAPRINE HCL 5 MG PO TABS: 5.0000 mg | ORAL_TABLET | Freq: Three times a day (TID) | ORAL | 1 refills | Status: DC | PRN

## 2020-07-11 MED ORDER — PREDNISONE 10 MG (21) PO TBPK: ORAL_TABLET | ORAL | 0 refills | Status: DC

## 2020-07-11 NOTE — Patient Instructions (Signed)
Decrease Ramipril from 10mg  to 5mg  daily.

## 2020-07-12 ENCOUNTER — Telehealth: Payer: Self-pay

## 2020-07-12 NOTE — Telephone Encounter (Signed)
Copied from [place] [id number]. Topic: Quick Communication - Rx Refill/Question >> Jul 11, 2020  4:10 PM Erick Blinks wrote: Pt's wife called requesting to speak to clinic regarding pt's medication. ramipril (ALTACE) 10 MG capsule   Best contact: [phone]

## 2020-07-12 NOTE — Telephone Encounter (Signed)
Advised patient of results. Patient reports that he was advised yesterday to decrease Ramipril to 5mg  daily. Patient reports that it is a capsule, and he can not cut it in half.   He mentions that he has been taking Ramipril 10mg  BID (total of 20mg ) daily. Do you still want him to take 5mg  a day? During the OV, we thought he was taking 10mg  ONCE daily. Please advise.

## 2020-07-12 NOTE — Telephone Encounter (Signed)
Advised 

## 2020-07-12 NOTE — Telephone Encounter (Signed)
-----   Message from Jerrol Banana., MD sent at 07/12/2020 10:51 AM EDT ----- Stable degenerative changes of lower back

## 2020-07-12 NOTE — Telephone Encounter (Signed)
Cut 10mg  BID to once daily

## 2020-07-15 ENCOUNTER — Ambulatory Visit: Payer: Self-pay | Admitting: *Deleted

## 2020-07-15 DIAGNOSIS — M79604 Pain in right leg: Secondary | ICD-10-CM

## 2020-07-15 DIAGNOSIS — M545 Low back pain, unspecified: Secondary | ICD-10-CM

## 2020-07-15 NOTE — Telephone Encounter (Signed)
  Pt called in to make an appt however he mentioned he is having so much pain in his left leg that it hurts to walk so he was transferred to me by the agent.  He was seen on 07/11/2020 by Dr. Rosanna Randy for back pain and right leg pain.   He had back x rays done.   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  He is c/o his left leg hurting above the knee.   Also from his waist down when he walks on it.   He can walk but he has to limp.   He was put on prednisone and a muscle relaxer.   He has one more pill of the prednisone left.   He is c/o the pain in his leg being worse now and it's difficult for him to walk around due to the pain.  I let him know I would send a note to Dr. Rosanna Randy letting him know the prednisone and muscle relaxer did not help and someone would call him back.   He was agreeable to this plan.  He can be reached at [phone].  I sent my notes to [facility].   Reason for Disposition . [1] MODERATE pain (e.g., interferes with normal activities, limping) AND [2] present > 3 days    Seen for this on 07/11/2020  Answer Assessment - Initial Assessment Questions 1. ONSET: "When did the pain start?"      Left leg above the knee from my waist down it hurts to walk on it.   Prednisone is not helping.   I had x rays done on my back a few days ago.   I use Voltaren  I told Dr. Rosanna Randy about it and he put me on prednisone and a muscle relaxer. 2. LOCATION: "Where is the pain located?"      Above my knee and from my waist down on the left. 3. PAIN: "How bad is the pain?"    (Scale 1-10; or mild, moderate, severe)   -  MILD (1-3): doesn't interfere with normal activities    -  MODERATE (4-7): interferes with normal activities (e.g., work or school) or awakens from sleep, limping    -  SEVERE (8-10): excruciating pain, unable to do any normal activities, unable to walk     I can't walk on my left leg.    The x ray shows arthritis in my back. 4. WORK OR EXERCISE:  "Has there been any recent work or exercise that involved this part of the body?"      I can walk but it hurts real bad. 5. CAUSE: "What do you think is causing the leg pain?"     I'm not sure.   The pain is worse it's all the time now in my left leg. 6. OTHER SYMPTOMS: "Do you have any other symptoms?" (e.g., chest pain, back pain, breathing difficulty, swelling, rash, fever, numbness, weakness)     No tingling or numbness in left foot. 7. PREGNANCY: "Is there any chance you are pregnant?" "When was your last menstrual period?"     N/A  Protocols used: LEG PAIN-A-AH

## 2020-07-15 NOTE — Addendum Note (Signed)
Addended by: Wilburt Finlay on: 07/15/2020 02:18 PM   Modules accepted: Orders

## 2020-07-15 NOTE — Telephone Encounter (Signed)
Order for referral placed. Patient advised.

## 2020-07-15 NOTE — Telephone Encounter (Signed)
Any recommendations? Patient is not feeling much better. Please review. Thanks!

## 2020-07-15 NOTE — Telephone Encounter (Signed)
Should consider referral to an orthopedist for the leg and back pain.

## 2020-07-18 ENCOUNTER — Ambulatory Visit: Payer: Self-pay

## 2020-07-18 DIAGNOSIS — M5416 Radiculopathy, lumbar region: Secondary | ICD-10-CM | POA: Insufficient documentation

## 2020-07-18 NOTE — Telephone Encounter (Signed)
  Patient's wife called per DPR, no answer, no voicemail set up. Patient called on cell number, left VM to return the call to the office to discuss pain.      Patient was seen 07/11/20 in office for lower back and leg pain   Patient received a shot to help with pain and discomfort   Patient's wife has made contact to share that the patient continues to experience leg pain   Patient's wife would like to be contacted further   Please contact to further advise when possible

## 2020-07-18 NOTE — Telephone Encounter (Signed)
Patient's wife called to discuss the back pain. She says he went to [facility] this morning after she called earlier. She was given that clinic to go to. She says they did x-rays and now is waiting to see if the insurance will pay for the injections. Advised to keep the appointment scheduled on 07/21/20 with Simona Huh and advised Ortho referral has been placed as per notes on 07/15/20.

## 2020-07-19 ENCOUNTER — Telehealth: Payer: Self-pay

## 2020-07-19 NOTE — Telephone Encounter (Signed)
Copied from [place] [id number]. Topic: General - Other >> Jul 19, 2020  9:48 AM Tessa Lerner A wrote: Reason for CRM: [facility] has notified the patient that their office is submitting documentation related to the patient's blood thinner medication   The patient's wife would like to be notified when that information is received and responded to.  Please contact to discuss further when possible

## 2020-07-21 ENCOUNTER — Telehealth: Payer: Self-pay

## 2020-07-21 ENCOUNTER — Other Ambulatory Visit: Payer: Self-pay

## 2020-07-21 ENCOUNTER — Ambulatory Visit (INDEPENDENT_AMBULATORY_CARE_PROVIDER_SITE_OTHER): Payer: Medicare Other | Admitting: Family Medicine

## 2020-07-21 VITALS — BP 154/77 | HR 77 | Ht 69.0 in | Wt 189.4 lb

## 2020-07-21 DIAGNOSIS — L720 Epidermal cyst: Secondary | ICD-10-CM

## 2020-07-21 DIAGNOSIS — M545 Low back pain, unspecified: Secondary | ICD-10-CM | POA: Diagnosis not present

## 2020-07-21 DIAGNOSIS — I10 Essential (primary) hypertension: Secondary | ICD-10-CM

## 2020-07-21 DIAGNOSIS — I2510 Atherosclerotic heart disease of native coronary artery without angina pectoris: Secondary | ICD-10-CM

## 2020-07-21 DIAGNOSIS — E782 Mixed hyperlipidemia: Secondary | ICD-10-CM

## 2020-07-21 DIAGNOSIS — E119 Type 2 diabetes mellitus without complications: Secondary | ICD-10-CM

## 2020-07-21 DIAGNOSIS — Z0000 Encounter for general adult medical examination without abnormal findings: Secondary | ICD-10-CM

## 2020-07-21 DIAGNOSIS — L570 Actinic keratosis: Secondary | ICD-10-CM

## 2020-07-21 NOTE — Progress Notes (Signed)
Complete physical exam   Patient: Luke Rojas   DOB: [birth year]   75 y.o. Male  MRN: [id number] Visit Date: 07/21/2020  Today's healthcare provider: Vernie Murders, PA-C   No chief complaint on file.  Subjective    Luke Rojas is a 75 y.o. male who presents today for a complete physical exam.  He reports consuming a general diet.  Patient is walking when he can tolerate back pain.  He generally feels well. He reports sleeping fairly well. He does have additional problems to discuss today. Patient would like to get his lungs listened to.  06/08/2015 Colonoscopy    HPI  Hypertension, follow-up  BP Readings from Last 3 Encounters:  07/11/20 106/66  06/08/20 113/67  02/11/20 128/81   Wt Readings from Last 3 Encounters:  07/11/20 190 lb (86.2 kg)  02/02/20 190 lb (86.2 kg)  12/01/19 188 lb (85.3 kg)     He was last seen for hypertension 10 days ago by Dr. Rosanna Randy.   BP at that visit was 106/66. Management since that visit includes increasing dose of Ramipril from 4 mg to 5 mg daily.  He reports excellent compliance with treatment. He is not having side effects.  He is following a Regular diet. He is exercising. He does not smoke.  Use of agents associated with hypertension: none.   Outside blood pressures are n/a. Symptoms: No chest pain No chest pressure  No palpitations No syncope  No dyspnea No orthopnea  No paroxysmal nocturnal dyspnea No lower extremity edema   Pertinent labs: Lab Results  Component Value Date   CHOL 118 12/04/2019   HDL 32 (L) 12/04/2019   LDLCALC 63 12/04/2019   TRIG 127 12/04/2019   CHOLHDL 3.7 12/04/2019   Lab Results  Component Value Date   NA 138 12/04/2019   K 4.1 12/04/2019   CREATININE 1.33 (H) 12/04/2019   GFRNONAA 52 (L) 12/04/2019   GFRAA 60 12/04/2019   GLUCOSE 84 12/04/2019     The ASCVD Risk score (Goff DC Jr., et al., 2013) failed to calculate for the following reasons:   The patient has a prior  MI or stroke diagnosis   --------------------------------------------------------------------------------------------------- Diabetes Mellitus Type II, Follow-up  Lab Results  Component Value Date   HGBA1C 6.3 (H) 12/04/2019   HGBA1C 6.2 (H) 12/02/2018   HGBA1C 6.2 (H) 08/27/2018   Wt Readings from Last 3 Encounters:  07/11/20 190 lb (86.2 kg)  02/02/20 190 lb (86.2 kg)  12/01/19 188 lb (85.3 kg)   Last seen for diabetes 8 months ago.  Management since then includes none. He reports excellent compliance with treatment. He is not having side effects.  Symptoms: No fatigue No foot ulcerations  Yes appetite changes - prednisone has increased his appetite  No nausea  No paresthesia of the feet  No polydipsia  No polyuria No visual disturbances   No vomiting     Home blood sugar records:  118  Episodes of hypoglycemia? No    Current insulin regiment: n/a Most Recent Eye Exam: 11/03/19 - no retinopathy Current exercise: walking Current diet habits: well balanced  Pertinent Labs: Lab Results  Component Value Date   CHOL 118 12/04/2019   HDL 32 (L) 12/04/2019   LDLCALC 63 12/04/2019   TRIG 127 12/04/2019   CHOLHDL 3.7 12/04/2019   Lab Results  Component Value Date   NA 138 12/04/2019   K 4.1 12/04/2019   CREATININE 1.33 (H) 12/04/2019   GFRNONAA  66 (L) 12/04/2019   GFRAA 60 12/04/2019   GLUCOSE 84 12/04/2019     ---------------------------------------------------------------------------------------------------  Past Medical History:  Diagnosis Date   Arthritis    OSTEOARTHRITIS   Chronic kidney disease    kidney stones   COPD (chronic obstructive pulmonary disease) (HCC)    Coronary artery disease    2 stents    Diabetes mellitus without complication (HCC)    GERD (gastroesophageal reflux disease)    Hypercholesteremia    Hypertension    Myocardial infarction [facility]) 5093   Renal colic    Past Surgical History:  Procedure Laterality Date   CARDIAC  CATHETERIZATION  2013   2 stents   CATARACT EXTRACTION, BILATERAL     COLONOSCOPY  04/27/05   COLONOSCOPY WITH PROPOFOL N/A 06/08/2015   Procedure: COLONOSCOPY WITH PROPOFOL;  Surgeon: Robert Bellow, MD;  Location: [facility] END[facility]COPY;  Service: Endoscopy;  Laterality: N/A;   CYSTOSCOPY WITH INSERTION OF UROLIFT N/A 02/11/2020   Procedure: CYSTOSCOPY WITH INSERTION OF UROLIFT;  Surgeon: Royston Cowper, MD;  Location: [facility] ORS;  Service: Urology;  Laterality: N/A;   CYSTOSCOPY WITH STENT PLACEMENT Right 09/16/2016   Procedure: CYSTOSCOPY WITH STENT PLACEMENT;  Surgeon: Royston Cowper, MD;  Location: [facility] ORS;  Service: Urology;  Laterality: Right;   EXTRACORPOREAL SHOCK WAVE LITHOTRIPSY Left 11/04/2014   has had 2 previous lithotripsies   EXTRACORPOREAL SHOCK WAVE LITHOTRIPSY Left 03/24/2015   Procedure: EXTRACORPOREAL SHOCK WAVE LITHOTRIPSY (ESWL);  Surgeon: Royston Cowper, MD;  Location: [facility] ORS;  Service: Urology;  Laterality: Left;   EXTRACORPOREAL SHOCK WAVE LITHOTRIPSY Right 10/04/2016   Procedure: EXTRACORPOREAL SHOCK WAVE LITHOTRIPSY (ESWL);  Surgeon: Royston Cowper, MD;  Location: [facility] ORS;  Service: Urology;  Laterality: Right;   LEFT HEART CATH AND CORONARY ANGIOGRAPHY N/A 12/05/2018   Procedure: LEFT HEART CATH AND CORONARY ANGIOGRAPHY with possible pci and stent;  Surgeon: Yolonda Kida, MD;  Location: [place] [facility];  Service: Cardiovascular;  Laterality: N/A;   Social History   Socioeconomic History   Marital status: Married    Spouse name: Not on file   Number of children: 2   Years of education: Not on file   Highest education level: 8th grade  Occupational History   Occupation: retired  Tobacco Use   Smoking status: Former Smoker    Packs/day: 1.00    Years: 30.00    Pack years: 30.00    Types: Cigarettes    Quit date: 03/05/1984    Years since quitting: 36.4   Smokeless tobacco: Current User    Types: Nurse, children's Use: Never used  Substance  and Sexual Activity   Alcohol use: No    Alcohol/week: 0.0 standard drinks   Drug use: No   Sexual activity: Not on file  Other Topics Concern   Not on file  Social History Narrative   Not on file   Social Determinants of Health   Financial Resource Strain: Low Risk    Difficulty of Paying Living Expenses: Not hard at all  Food Insecurity: No Food Insecurity   Worried About Charity fundraiser in the Last Year: Never true   [place] in the Last Year: Never true  Transportation Needs: No Transportation Needs   Lack of Transportation (Medical): No   Lack of Transportation (Non-Medical): No  Physical Activity: Inactive   Days of Exercise per Week: 0 days   Minutes of Exercise per Session: 0  min  Stress: No Stress Concern Present   Feeling of Stress : Not at all  Social Connections: Moderately Integrated   Frequency of Communication with Friends and Family: More than three times a week   Frequency of Social Gatherings with Friends and Family: More than three times a week   Attends Religious Services: More than 4 times per year   Active Member of [employer] or Organizations: No   Attends Archivist Meetings: Never   Marital Status: Married  Human resources officer Violence: Not At Risk   Fear of Current or Ex-Partner: No   Emotionally Abused: No   Physically Abused: No   Sexually Abused: No   Family Status  Relation Name Status   Mother  Deceased at age 15   Father  Deceased at age 32   Sister  56   Brother  Deceased       [place]   Brother  Deceased   Brother  Deceased at age 62       MENINGITIS   Family History  Problem Relation Age of Onset   Pulmonary embolism Mother    Transient ischemic attack Mother    Diabetes Mother    Pancreatic cancer Father    Hypertension Father    Diabetes Father    Cirrhosis Brother    Allergies  Allergen Reactions   Codeine Nausea Only and Nausea And Vomiting    Patient Care Team: Dianna Deshler, Driscilla Grammes as PCP -  General (Physician Assistant) Thelma Comp, [place] as Consulting Physician (Optometry) Royston Cowper, MD as Consulting Physician (Urology) Yolonda Kida, MD as Consulting Physician (Cardiology)   Medications: Outpatient Medications Prior to Visit  Medication Sig   amLODipine (NORVASC) 5 MG tablet Take 5 mg by mouth daily.    atorvastatin (LIPITOR) 80 MG tablet Take 1 tablet (80 mg total) by mouth at bedtime.   baclofen (LIORESAL) 10 MG tablet Take 1 tablet (10 mg total) by mouth 3 (three) times daily. (Patient not taking: No sig reported)   ciprofloxacin (CIPRO) 500 MG tablet Take 1 tablet (500 mg total) by mouth 2 (two) times daily. (Patient not taking: No sig reported)   clopidogrel (PLAVIX) 75 MG tablet Take 75 mg by mouth daily.   co-enzyme Q-10 30 MG capsule Take 30 mg by mouth daily.    cyclobenzaprine (FLEXERIL) 5 MG tablet Take 1 tablet (5 mg total) by mouth 3 (three) times daily as needed for muscle spasms.   dextromethorphan-guaiFENesin (ROBITUSSIN-DM) 10-100 MG/5ML liquid Take 5 mLs by mouth every 6 (six) hours as needed for cough. (Patient not taking: Reported on 07/11/2020)   diclofenac sodium (VOLTAREN) 1 % GEL Apply 2 g topically 4 (four) times daily.   doxycycline (VIBRAMYCIN) 100 MG capsule Take 1 capsule (100 mg total) by mouth 2 (two) times daily. (Patient not taking: No sig reported)   finasteride (PROSCAR) 5 MG tablet Take 5 mg by mouth daily.  (Patient not taking: No sig reported)   isosorbide mononitrate (IMDUR) 60 MG 24 hr tablet Take 60 mg by mouth daily. (Patient not taking: No sig reported)   Krill Oil 1000 MG CAPS Take 1,000 mg by mouth daily.    Meth-Hyo-M Bl-Na Phos-Ph Sal (URIBEL) 118 MG CAPS Take 1 capsule (118 mg total) by mouth every 6 (six) hours as needed (dysuria). (Patient not taking: No sig reported)   metoprolol tartrate (LOPRESSOR) 25 MG tablet Take 1 tablet (25 mg total) by mouth 2 (two) times daily.   MULTIPLE  VITAMIN PO Take 1 tablet by mouth  daily.   nitroGLYCERIN (NITROSTAT) 0.4 MG SL tablet Place 1 tablet (0.4 mg total) under the tongue every 5 (five) minutes as needed for chest pain.   Omega-3 Fatty Acids (FISH OIL) 1200 MG CAPS Take 1 capsule by mouth daily.  (Patient not taking: No sig reported)   OneTouch Delica Lancets 40J MISC TEST FASTING GLUCOSE LEVEL EACH MORNING BEFORE BREAKFAST   pantoprazole (PROTONIX) 40 MG tablet Take 40 mg by mouth daily.   predniSONE (STERAPRED UNI-PAK 21 TAB) 10 MG (21) TBPK tablet Taper as directed.   ramipril (ALTACE) 10 MG capsule Take 1 capsule (10 mg total) by mouth daily.   sulfamethoxazole-trimethoprim (BACTRIM DS) 800-160 MG tablet Take 1 tablet by mouth 2 (two) times daily. (Patient not taking: No sig reported)   tamsulosin (FLOMAX) 0.4 MG CAPS capsule Take 0.4 mg by mouth daily.   No facility-administered medications prior to visit.    Review of Systems  Musculoskeletal: Positive for arthralgias, back pain and myalgias.  Hematological: Bruises/bleeds easily.      Objective    BP (!) 154/77 (BP Location: Left Arm, Patient Position: Sitting, Cuff Size: Large)   Pulse 77   Ht 5\' 9"  (1.753 m)   Wt 189 lb 6.4 oz (85.9 kg)   SpO2 99%   BMI 27.97 kg/m  BP Readings from Last 3 Encounters:  07/21/20 (!) 154/77  07/11/20 106/66  06/08/20 113/67   Wt Readings from Last 3 Encounters:  07/21/20 189 lb 6.4 oz (85.9 kg)  07/11/20 190 lb (86.2 kg)  02/02/20 190 lb (86.2 kg)     Physical Exam Constitutional:      Appearance: He is well-developed.  HENT:     Head: Normocephalic and atraumatic.     Right Ear: External ear normal.     Left Ear: External ear normal.     Nose: Nose normal.  Eyes:     General:        Right eye: No discharge.     Conjunctiva/sclera: Conjunctivae normal.     Pupils: Pupils are equal, round, and reactive to light.  Neck:     Thyroid: No thyromegaly.     Trachea: No tracheal deviation.  Cardiovascular:     Rate and Rhythm: Normal rate and regular  rhythm.     Pulses: Normal pulses.     Heart sounds: Normal heart sounds. No murmur heard.   Pulmonary:     Effort: Pulmonary effort is normal. No respiratory distress.     Breath sounds: Normal breath sounds. No wheezing or rales.  Chest:     Chest wall: No tenderness.  Abdominal:     General: Bowel sounds are normal. There is no distension.     Palpations: Abdomen is soft. There is no mass.     Tenderness: There is no abdominal tenderness. There is no guarding or rebound.  Genitourinary:    Comments: Deferred to urologist. Musculoskeletal:        General: Tenderness present. Normal range of motion.     Cervical back: Normal range of motion and neck supple.     Comments: Tender lower back with radiation into the right anterior thigh and lateral calf.  Lymphadenopathy:     Cervical: No cervical adenopathy.  Skin:    General: Skin is warm and dry.     Findings: Lesion present. No erythema or rash.     Comments: Sebaceous/epidermal cyst right axilla and right upper lumbar region. 2-3  mm keratotic lesion on upper midline forehead.  Neurological:     Mental Status: He is alert and oriented to person, place, and time.     Cranial Nerves: No cranial nerve deficit.     Motor: No abnormal muscle tone.     Coordination: Coordination normal.     Deep Tendon Reflexes: Reflexes are normal and symmetric.     Comments: Diminished DTR in the right knee. No numbness.  Psychiatric:        Behavior: Behavior normal.        Thought Content: Thought content normal.        Judgment: Judgment normal.     Diabetic Foot Form - Detailed   Diabetic Foot Exam - detailed Diabetic Foot exam was performed with the following findings: Yes 07/21/2020 12:12 PM  Visual Foot Exam completed.: Yes  Can the patient see the bottom of their feet?: Yes Are the shoes appropriate in style and fit?: Yes Is there swelling or and abnormal foot shape?: No Is there a claw toe deformity?: No Is there elevated skin  temparature?: No Is there foot or ankle muscle weakness?: No Normal Range of Motion: Yes Pulse Foot Exam completed.: Yes   Right posterior Tibialias: Present Left posterior Tibialias: Present   Right Dorsalis Pedis: Present Left Dorsalis Pedis: Present  Sensory Foot Exam Completed.: Yes Semmes-Weinstein Monofilament Test R Site 1-Great Toe: Pos L Site 1-Great Toe: Pos          Last depression screening scores PHQ 2/9 Scores 04/07/2020 04/02/2019 04/02/2019  PHQ - 2 Score 0 0 0  PHQ- 9 Score - - -   Last fall risk screening Fall Risk  04/07/2020  Falls in the past year? 0  Comment -  Number falls in past yr: 0  Injury with Fall? 0  Comment -  Follow up -   Last Audit-C alcohol use screening Alcohol Use Disorder Test (AUDIT) 04/07/2020  1. How often do you have a drink containing alcohol? 0  2. How many drinks containing alcohol do you have on a typical day when you are drinking? 0  3. How often do you have six or more drinks on one occasion? 0  AUDIT-C Score 0  Alcohol Brief Interventions/Follow-up AUDIT Score <7 follow-up not indicated   A score of 3 or more in women, and 4 or more in men indicates increased risk for alcohol abuse, EXCEPT if all of the points are from question 1   No results found for any visits on 07/21/20.  Assessment & Plan    Routine Health Maintenance and Physical Exam  Exercise Activities and Dietary recommendations Goals      Exercise 3x per week (30 min per time)     Recommend to exercise for 3 days a week for at least 30 minutes at a time.      Increase water intake     Recommend to increase water intake to 6-8 8 oz glasses a day.         Immunization History  Administered Date(s) Administered   Fluad Quad(high Dose 65+) 12/31/2018   Influenza Split 03/10/2010, 03/11/2012   Influenza, High Dose Seasonal PF 01/07/2014, 10/31/2015, 10/30/2016, 11/22/2017, 12/05/2019   Influenza,inj,Quad PF,6+ Mos 12/09/2012   Moderna Sars-Covid-2  Vaccination 07/22/2019, 08/22/2019, 02/18/2020   Pneumococcal Conjugate-13 01/07/2014   Pneumococcal Polysaccharide-23 03/31/2015   Tdap 09/23/2009   Zoster 04/02/2011    Health Maintenance  Topic Date Due   FOOT EXAM  11/27/2019   COVID-19  Vaccine (4 - Booster for Moderna series) 05/18/2020   HEMOGLOBIN A1C  06/03/2020   TETANUS/TDAP  04/07/2021 (Originally 09/24/2019)   INFLUENZA VACCINE  10/03/2020   OPHTHALMOLOGY EXAM  11/02/2020   COLONOSCOPY (Pts 45-80yrs Insurance coverage will need to be confirmed)  06/07/2025   Hepatitis C Screening  Completed   PNA vac Low Risk Adult  Completed   HPV VACCINES  Aged Out    Discussed health benefits of physical activity, and encouraged him to engage in regular exercise appropriate for his age and condition.  1. Encounter for Medicare annual wellness exam General health fairly stable. Counseled regarding health maintenance.  2. Low back pain, unspecified back pain laterality, unspecified chronicity, unspecified whether sciatica present Right low back pain with radiation down the right leg. Trying Voltaren Gel, Neurontin and Tramadol for pain. Referral to orthopedist spine surgeon on 07-15-20.  3. Essential hypertension Fair control of BP. Slightly up with back pain issues. Continue present meds and get follow up labs. Plan follow up pending reports. - CBC with Differential/Platelet - Comprehensive metabolic panel - TSH  4. Coronary artery disease involving native coronary artery of native heart without angina pectoris Followed by Dr. Clayborn Bigness (cardiologist). Had comples PCI/stent to LAD in 2020 at [facility]. Denies chest pains or palpitations. Recheck follow up labs. - CBC with Differential/Platelet - Comprehensive metabolic panel - Lipid panel  5. Diabetes mellitus without complication (HCC) Hgb B3Z was 6.3% on 12-04-19. Maintains control with diet and activity levels. Check CBC, CMP, Lipid Panel and Hgb A1C. Normal foot exam and encouraged  to see ophthalmologist annually. - CBC with Differential/Platelet - Comprehensive metabolic panel - Hemoglobin A1c - Lipid panel  6. Epidermoid cyst Two cysts that are not infected or painful. (Right lumbar back and right axilla). Monitor for changes and can refer to dermatologist or surgeon prn.  7. Mixed hyperlipidemia Presently tolerating the Atorvastatin 80 mg qd with history of CAD. Continue to work on low fat diet and recheck labs. - CBC with Differential/Platelet - Comprehensive metabolic panel - Lipid panel  8. Keratotic lesion Frozen lesion with Cryo-Pen for 90 seconds.   No follow-ups on file.     I, Shaundra Fullam, PA-C, have reviewed all documentation for this visit. The documentation on 07/21/20 for the exam, diagnosis, procedures, and orders are all accurate and complete.    Vernie Murders, PA-C  [employer] [phone] (phone) [phone] (fax)  [place]

## 2020-07-21 NOTE — Telephone Encounter (Signed)
Copied from [place] [id number]. Topic: General - Other >> Jul 19, 2020  9:48 AM Tessa Lerner A wrote: Reason for CRM: [facility] has notified the patient that their office is submitting documentation related to the patient's blood thinner medication   The patient's wife would like to be notified when that information is received and responded to.  Please contact to discuss further when possible >> Jul 21, 2020  4:29 PM Jodie Echevaria wrote:  Lawerance Bach with [facility] called in to inquire of a form that was sent over on 07/19/20 for anti coagulant clearance for patient. Asking if this form can be returned to fax number provided  Ph# [phone]     Fax# [phone]

## 2020-11-28 ENCOUNTER — Encounter: Payer: Self-pay | Admitting: Family Medicine

## 2020-12-21 ENCOUNTER — Other Ambulatory Visit: Payer: Self-pay | Admitting: Family Medicine

## 2020-12-21 ENCOUNTER — Telehealth: Payer: Self-pay

## 2020-12-21 NOTE — Telephone Encounter (Signed)
Copied from [place] [id number]. Topic: General - Other >> Dec 21, 2020  1:13 PM Tessa Lerner A wrote: Reason for CRM: The patient would like to be assigned to [facility]. Rollene Rotunda or Dr. Rosanna Randy as a Primary Care Provider   Please contact patient's spouse to confirm when possible

## 2020-12-22 NOTE — Telephone Encounter (Signed)
Left message that Dr. Rosanna Randy will take Luke Rojas and his wife as patients in the spring of 2023.  If they need to be seen by someone earlier, they can see the APPs but he will take them as patients.

## 2020-12-26 ENCOUNTER — Telehealth: Payer: Self-pay

## 2020-12-26 NOTE — Telephone Encounter (Signed)
Copied from [place] [id number]. Topic: Appointment Scheduling - Scheduling Inquiry for Clinic >> Dec 26, 2020 10:40 AM Rayann Heman wrote: Reason for CRM: Pt called and stated that he would like to scheduled an appointment to have warts frozen off/wart on on forehead.

## 2020-12-26 NOTE — Telephone Encounter (Signed)
Scheduled patient appt tomorrow morning at 12/27/20 10:40AM. Luke Rojas

## 2020-12-27 ENCOUNTER — Encounter: Payer: Self-pay | Admitting: Physician Assistant

## 2020-12-27 ENCOUNTER — Other Ambulatory Visit: Payer: Self-pay

## 2020-12-27 ENCOUNTER — Ambulatory Visit (INDEPENDENT_AMBULATORY_CARE_PROVIDER_SITE_OTHER): Payer: Medicare Other | Admitting: Physician Assistant

## 2020-12-27 VITALS — BP 125/60 | HR 59 | Temp 97.7°F | Resp 16 | Wt 181.8 lb

## 2020-12-27 DIAGNOSIS — L570 Actinic keratosis: Secondary | ICD-10-CM

## 2020-12-27 NOTE — Progress Notes (Signed)
Established patient visit   Patient: Luke Rojas   DOB: 01-09-1946   75 y.o. Male  MRN: [id number] Visit Date: 12/27/2020  Today's healthcare provider: [name], PA-C   Chief Complaint  Patient presents with   Skin Problem    Patient presents in office today with conern of wart that is on his forehead and has been present over a month. Patient states that he has had area cryo 2x before and was told by previous PCP he would need one more treatment.   Subjective    HPI HPI     Skin Problem    Additional comments: Patient presents in office today with conern of wart that is on his forehead and has been present over a month. Patient states that he has had area cryo 2x before and was told by previous PCP he would need one more treatment.      Last edited by Minette Headland, CMA on 12/27/2020 10:45 AM.      Reports last cryo was several months ago. Denies any bleeding to area on forehead, denies any changes in color/shape/size.   Medications: Outpatient Medications Prior to Visit  Medication Sig   atorvastatin (LIPITOR) 80 MG tablet TAKE 1 TABLET BY MOUTH EVERY NIGHT AT BEDTIME   baclofen (LIORESAL) 10 MG tablet Take 1 tablet (10 mg total) by mouth 3 (three) times daily.   clopidogrel (PLAVIX) 75 MG tablet Take 75 mg by mouth daily.   co-enzyme Q-10 30 MG capsule Take 30 mg by mouth daily.    cyclobenzaprine (FLEXERIL) 5 MG tablet Take 1 tablet (5 mg total) by mouth 3 (three) times daily as needed for muscle spasms.   diclofenac sodium (VOLTAREN) 1 % GEL Apply 2 g topically 4 (four) times daily.   finasteride (PROSCAR) 5 MG tablet Take 5 mg by mouth daily.   isosorbide mononitrate (IMDUR) 60 MG 24 hr tablet Take 60 mg by mouth daily.   Krill Oil 1000 MG CAPS Take 1,000 mg by mouth daily.    Meth-Hyo-M Bl-Na Phos-Ph Sal (URIBEL) 118 MG CAPS Take 1 capsule (118 mg total) by mouth every 6 (six) hours as needed (dysuria).   metoprolol tartrate (LOPRESSOR) 25  MG tablet Take 1 tablet (25 mg total) by mouth 2 (two) times daily.   MULTIPLE VITAMIN PO Take 1 tablet by mouth daily.   nitroGLYCERIN (NITROSTAT) 0.4 MG SL tablet Place 1 tablet (0.4 mg total) under the tongue every 5 (five) minutes as needed for chest pain.   Omega-3 Fatty Acids (FISH OIL) 1200 MG CAPS Take 1 capsule by mouth daily.   OneTouch Delica Lancets 88C MISC TEST FASTING GLUCOSE LEVEL EACH MORNING BEFORE BREAKFAST   pantoprazole (PROTONIX) 40 MG tablet Take 40 mg by mouth daily.   ramipril (ALTACE) 10 MG capsule Take 1 capsule (10 mg total) by mouth daily.   tamsulosin (FLOMAX) 0.4 MG CAPS capsule Take 0.4 mg by mouth daily.   amLODipine (NORVASC) 5 MG tablet Take 5 mg by mouth daily.    [DISCONTINUED] ciprofloxacin (CIPRO) 500 MG tablet Take 1 tablet (500 mg total) by mouth 2 (two) times daily. (Patient not taking: Reported on 12/27/2020)   [DISCONTINUED] dextromethorphan-guaiFENesin (ROBITUSSIN-DM) 10-100 MG/5ML liquid Take 5 mLs by mouth every 6 (six) hours as needed for cough. (Patient not taking: Reported on 12/27/2020)   [DISCONTINUED] doxycycline (VIBRAMYCIN) 100 MG capsule Take 1 capsule (100 mg total) by mouth 2 (two) times daily. (Patient not taking: Reported on  12/27/2020)   [DISCONTINUED] predniSONE (STERAPRED UNI-PAK 21 TAB) 10 MG (21) TBPK tablet Taper as directed. (Patient not taking: Reported on 12/27/2020)   [DISCONTINUED] sulfamethoxazole-trimethoprim (BACTRIM DS) 800-160 MG tablet Take 1 tablet by mouth 2 (two) times daily. (Patient not taking: Reported on 12/27/2020)   No facility-administered medications prior to visit.    Review of Systems  Skin:  Positive for color change.      Objective    BP 125/60   Pulse (!) 59   Temp 97.7 F (36.5 C) (Oral)   Resp 16   Wt 181 lb 12.8 oz (82.5 kg)   BMI 26.85 kg/m    Physical Exam Constitutional:      General: He is not in acute distress.    Appearance: Normal appearance.  Pulmonary:     Effort: Pulmonary  effort is normal.  Skin:    General: Skin is warm.     Findings: Lesion and rash present.     Comments: Top of forehead there is a 1-2 mm pedunculated skin-colored lesion. No associated bleeding.  Neurological:     Mental Status: He is alert.      No results found for any visits on 12/27/20.  Assessment & Plan     Keratotic lesion Frozen w/ cryopen for 90 seconds. Advised will likely need to see dermatologist as pt reports little changes with multiple cryo sessions. referral sent.   Return if symptoms worsen or fail to improve.      I, [name], PA-C have reviewed all documentation for this visit. The documentation on 12/27/2020 for the exam, diagnosis, procedures, and orders are all accurate and complete.    [name], PA-C  [facility] [phone] (phone) [phone] (fax)  [place]

## 2021-01-06 DIAGNOSIS — Z9842 Cataract extraction status, left eye: Secondary | ICD-10-CM | POA: Diagnosis not present

## 2021-01-06 DIAGNOSIS — E119 Type 2 diabetes mellitus without complications: Secondary | ICD-10-CM | POA: Diagnosis not present

## 2021-01-06 DIAGNOSIS — H52223 Regular astigmatism, bilateral: Secondary | ICD-10-CM | POA: Diagnosis not present

## 2021-01-06 DIAGNOSIS — Z9841 Cataract extraction status, right eye: Secondary | ICD-10-CM | POA: Diagnosis not present

## 2021-01-06 LAB — HM DIABETES EYE EXAM

## 2021-01-19 ENCOUNTER — Telehealth: Payer: Self-pay | Admitting: *Deleted

## 2021-01-19 ENCOUNTER — Other Ambulatory Visit: Payer: Self-pay | Admitting: Family Medicine

## 2021-01-19 DIAGNOSIS — L570 Actinic keratosis: Secondary | ICD-10-CM

## 2021-01-19 NOTE — Telephone Encounter (Signed)
Copied from [place] [id number]. Topic: General - Other >> Jan 19, 2021  9:34 AM Celene Kras wrote: Reason for CRM: Pt called stating that he came in a few weeks ago to have a mole freezeed. He states that it is still there and is requesting to have next steps. Please advise.

## 2021-01-23 NOTE — Telephone Encounter (Signed)
Patient advised about referral, he states that mole had fallen off on Friday night .KW

## 2021-01-25 ENCOUNTER — Other Ambulatory Visit: Payer: Self-pay

## 2021-01-25 ENCOUNTER — Ambulatory Visit (INDEPENDENT_AMBULATORY_CARE_PROVIDER_SITE_OTHER): Payer: Medicare Other | Admitting: Physician Assistant

## 2021-01-25 DIAGNOSIS — Z23 Encounter for immunization: Secondary | ICD-10-CM

## 2021-01-30 DIAGNOSIS — Z79899 Other long term (current) drug therapy: Secondary | ICD-10-CM | POA: Diagnosis not present

## 2021-01-30 DIAGNOSIS — I2510 Atherosclerotic heart disease of native coronary artery without angina pectoris: Secondary | ICD-10-CM | POA: Diagnosis not present

## 2021-01-30 DIAGNOSIS — Z955 Presence of coronary angioplasty implant and graft: Secondary | ICD-10-CM | POA: Diagnosis not present

## 2021-01-30 DIAGNOSIS — I25118 Atherosclerotic heart disease of native coronary artery with other forms of angina pectoris: Secondary | ICD-10-CM | POA: Diagnosis not present

## 2021-01-30 DIAGNOSIS — Z1329 Encounter for screening for other suspected endocrine disorder: Secondary | ICD-10-CM | POA: Diagnosis not present

## 2021-01-30 DIAGNOSIS — E785 Hyperlipidemia, unspecified: Secondary | ICD-10-CM | POA: Diagnosis not present

## 2021-01-30 DIAGNOSIS — I480 Paroxysmal atrial fibrillation: Secondary | ICD-10-CM | POA: Diagnosis not present

## 2021-01-30 DIAGNOSIS — E7800 Pure hypercholesterolemia, unspecified: Secondary | ICD-10-CM | POA: Diagnosis not present

## 2021-01-30 DIAGNOSIS — E119 Type 2 diabetes mellitus without complications: Secondary | ICD-10-CM | POA: Diagnosis not present

## 2021-01-30 DIAGNOSIS — I701 Atherosclerosis of renal artery: Secondary | ICD-10-CM | POA: Diagnosis not present

## 2021-02-06 DIAGNOSIS — E7800 Pure hypercholesterolemia, unspecified: Secondary | ICD-10-CM | POA: Diagnosis not present

## 2021-02-06 DIAGNOSIS — Z79899 Other long term (current) drug therapy: Secondary | ICD-10-CM | POA: Diagnosis not present

## 2021-02-06 DIAGNOSIS — E119 Type 2 diabetes mellitus without complications: Secondary | ICD-10-CM | POA: Diagnosis not present

## 2021-02-06 DIAGNOSIS — I480 Paroxysmal atrial fibrillation: Secondary | ICD-10-CM | POA: Diagnosis not present

## 2021-02-06 DIAGNOSIS — I2510 Atherosclerotic heart disease of native coronary artery without angina pectoris: Secondary | ICD-10-CM | POA: Diagnosis not present

## 2021-02-06 DIAGNOSIS — E785 Hyperlipidemia, unspecified: Secondary | ICD-10-CM | POA: Diagnosis not present

## 2021-02-06 DIAGNOSIS — Z1329 Encounter for screening for other suspected endocrine disorder: Secondary | ICD-10-CM | POA: Diagnosis not present

## 2021-02-06 DIAGNOSIS — I701 Atherosclerosis of renal artery: Secondary | ICD-10-CM | POA: Diagnosis not present

## 2021-02-10 DIAGNOSIS — M5416 Radiculopathy, lumbar region: Secondary | ICD-10-CM | POA: Diagnosis not present

## 2021-03-01 DIAGNOSIS — J019 Acute sinusitis, unspecified: Secondary | ICD-10-CM | POA: Diagnosis not present

## 2021-03-01 DIAGNOSIS — J209 Acute bronchitis, unspecified: Secondary | ICD-10-CM | POA: Diagnosis not present

## 2021-03-01 DIAGNOSIS — B9689 Other specified bacterial agents as the cause of diseases classified elsewhere: Secondary | ICD-10-CM | POA: Diagnosis not present

## 2021-03-01 DIAGNOSIS — Z03818 Encounter for observation for suspected exposure to other biological agents ruled out: Secondary | ICD-10-CM | POA: Diagnosis not present

## 2021-03-08 DIAGNOSIS — M5416 Radiculopathy, lumbar region: Secondary | ICD-10-CM | POA: Diagnosis not present

## 2021-03-15 ENCOUNTER — Other Ambulatory Visit: Payer: Self-pay

## 2021-03-15 ENCOUNTER — Encounter: Payer: Self-pay | Admitting: Family Medicine

## 2021-03-15 ENCOUNTER — Ambulatory Visit (INDEPENDENT_AMBULATORY_CARE_PROVIDER_SITE_OTHER): Payer: Medicare Other | Admitting: Family Medicine

## 2021-03-15 VITALS — BP 156/67 | HR 67 | Temp 97.6°F | Resp 16 | Wt 183.6 lb

## 2021-03-15 DIAGNOSIS — H6123 Impacted cerumen, bilateral: Secondary | ICD-10-CM | POA: Diagnosis not present

## 2021-03-15 DIAGNOSIS — I152 Hypertension secondary to endocrine disorders: Secondary | ICD-10-CM

## 2021-03-15 DIAGNOSIS — L570 Actinic keratosis: Secondary | ICD-10-CM | POA: Diagnosis not present

## 2021-03-15 DIAGNOSIS — E1159 Type 2 diabetes mellitus with other circulatory complications: Secondary | ICD-10-CM

## 2021-03-15 MED ORDER — FLUOROURACIL 5 % EX CREA: TOPICAL_CREAM | Freq: Two times a day (BID) | CUTANEOUS | 0 refills | Status: DC

## 2021-03-15 MED ORDER — AMLODIPINE BESYLATE 5 MG PO TABS: 10.0000 mg | ORAL_TABLET | Freq: Every day | ORAL | 25 refills | Status: DC

## 2021-03-15 NOTE — Assessment & Plan Note (Signed)
No inflammation noted; ears cleaned with irrigation by [facility] staff

## 2021-03-15 NOTE — Assessment & Plan Note (Signed)
Chronic, unstable Pt did not take Rx this morning Encouraged to be mindful with timing of medication use  Titrate Norvasc to 10mg - return to clinic in 1 month to assess goal of <130/<80

## 2021-03-15 NOTE — Assessment & Plan Note (Signed)
reoccurrence of lesion on R forehead new brow line since last cryo in office Trial of 5%FU cream; advised likely iritration with use Repeat referral to derm given concern for urgency given increased frequency with regrowth

## 2021-03-15 NOTE — Progress Notes (Signed)
Established patient visit   Patient: Luke Rojas   DOB: 08-20-1945   76 y.o. Male  MRN: [id number] Visit Date: 03/15/2021  Today's healthcare provider: Gwyneth Sprout, FNP    I,Joseline E Rosas,acting as a scribe for Gwyneth Sprout, FNP.,have documented all relevant documentation on the behalf of Gwyneth Sprout, FNP,as directed by  Gwyneth Sprout, FNP while in the presence of Gwyneth Sprout, FNP.   Chief Complaint  Patient presents with   Ear Fullness   Subjective    HPI  Patient here for skin tag follow up and reports that his ears are clogged with no URI symptoms.  Medications: Outpatient Medications Prior to Visit  Medication Sig   atorvastatin (LIPITOR) 80 MG tablet TAKE 1 TABLET BY MOUTH EVERY NIGHT AT BEDTIME   baclofen (LIORESAL) 10 MG tablet Take 1 tablet (10 mg total) by mouth 3 (three) times daily.   clopidogrel (PLAVIX) 75 MG tablet Take 75 mg by mouth daily.   co-enzyme Q-10 30 MG capsule Take 30 mg by mouth daily.    cyclobenzaprine (FLEXERIL) 5 MG tablet Take 1 tablet (5 mg total) by mouth 3 (three) times daily as needed for muscle spasms.   diclofenac sodium (VOLTAREN) 1 % GEL Apply 2 g topically 4 (four) times daily.   finasteride (PROSCAR) 5 MG tablet Take 5 mg by mouth daily.   isosorbide mononitrate (IMDUR) 60 MG 24 hr tablet Take 60 mg by mouth daily.   Krill Oil 1000 MG CAPS Take 1,000 mg by mouth daily.    Meth-Hyo-M Bl-Na Phos-Ph Sal (URIBEL) 118 MG CAPS Take 1 capsule (118 mg total) by mouth every 6 (six) hours as needed (dysuria).   metoprolol tartrate (LOPRESSOR) 25 MG tablet Take 1 tablet (25 mg total) by mouth 2 (two) times daily.   MULTIPLE VITAMIN PO Take 1 tablet by mouth daily.   nitroGLYCERIN (NITROSTAT) 0.4 MG SL tablet Place 1 tablet (0.4 mg total) under the tongue every 5 (five) minutes as needed for chest pain.   Omega-3 Fatty Acids (FISH OIL) 1200 MG CAPS Take 1 capsule by mouth daily.   OneTouch Delica Lancets 91T MISC TEST FASTING  GLUCOSE LEVEL EACH MORNING BEFORE BREAKFAST   pantoprazole (PROTONIX) 40 MG tablet Take 40 mg by mouth daily.   ramipril (ALTACE) 10 MG capsule Take 1 capsule (10 mg total) by mouth daily.   tamsulosin (FLOMAX) 0.4 MG CAPS capsule Take 0.4 mg by mouth daily.   [DISCONTINUED] amLODipine (NORVASC) 5 MG tablet Take 5 mg by mouth daily.    No facility-administered medications prior to visit.    Review of Systems     Objective    BP (!) 156/67 (BP Location: Right Arm, Patient Position: Sitting, Cuff Size: Normal)    Pulse 67    Temp 97.6 F (36.4 C) (Oral)    Resp 16    Wt 183 lb 9.6 oz (83.3 kg)    BMI 27.11 kg/m    Physical Exam Vitals and nursing note reviewed.  Constitutional:      Appearance: Normal appearance. He is overweight.  HENT:     Head: Normocephalic and atraumatic.      Comments: Picture not to scale- two small lesions, total of <1cm x 1cm combined    Right Ear: There is impacted cerumen.     Left Ear: There is impacted cerumen.  Eyes:     Pupils: Pupils are equal, round, and reactive to light.  Cardiovascular:     Rate and Rhythm: Normal rate and regular rhythm.     Pulses: Normal pulses.     Heart sounds: Normal heart sounds.  Pulmonary:     Effort: Pulmonary effort is normal.     Breath sounds: Normal breath sounds.  Musculoskeletal:        General: Normal range of motion.     Cervical back: Normal range of motion.  Skin:    General: Skin is warm and dry.     Capillary Refill: Capillary refill takes less than 2 seconds.     Findings: Lesion present.  Neurological:     General: No focal deficit present.     Mental Status: He is alert and oriented to person, place, and time. Mental status is at baseline.  Psychiatric:        Mood and Affect: Mood normal.        Behavior: Behavior normal.        Thought Content: Thought content normal.        Judgment: Judgment normal.      No results found for any visits on 03/15/21.  Assessment & Plan      Problem List Items Addressed This Visit       Cardiovascular and Mediastinum   Hypertension associated with diabetes ([place])    Chronic, unstable Pt did not take Rx this morning Encouraged to be mindful with timing of medication use  Titrate Norvasc to 10mg - return to clinic in 1 month to assess goal of <130/<80      Relevant Medications   amLODipine (NORVASC) 5 MG tablet     Nervous and Auditory   Impacted cerumen of both ears - Primary    No inflammation noted; ears cleaned with irrigation by [facility] staff        Other   Keratotic lesion    reoccurrence of lesion on R forehead new brow line since last cryo in office Trial of 5%FU cream; advised likely iritration with use Repeat referral to derm given concern for urgency given increased frequency with regrowth      Relevant Medications   fluorouracil (EFUDEX) 5 % cream   Other Relevant Orders   Ambulatory referral to Dermatology     Return in about 4 weeks (around 04/12/2021) for blood pressure check, nurse follow up.      I, [place], CMA, have reviewed all documentation for this visit. The documentation on 03/15/21 for the exam, diagnosis, procedures, and orders are all accurate and complete.    Gwyneth Sprout, [place] [phone] (phone) [phone] (fax)  [place]

## 2021-03-16 ENCOUNTER — Ambulatory Visit: Payer: Medicare Other | Admitting: Dermatology

## 2021-03-16 DIAGNOSIS — L570 Actinic keratosis: Secondary | ICD-10-CM | POA: Diagnosis not present

## 2021-03-16 DIAGNOSIS — L578 Other skin changes due to chronic exposure to nonionizing radiation: Secondary | ICD-10-CM | POA: Diagnosis not present

## 2021-03-16 DIAGNOSIS — L820 Inflamed seborrheic keratosis: Secondary | ICD-10-CM

## 2021-03-16 NOTE — Patient Instructions (Addendum)

## 2021-03-16 NOTE — Progress Notes (Signed)
New Patient Visit  Subjective  Luke Rojas is a 76 y.o. male who presents for the following: Skin Problem (Check growth on the forehead, pt had this area froze by his PCP several months ago. ). The patient has spots, moles and lesions to be evaluated, some may be new or changing and the patient has concerns that these could be cancer.   The following portions of the chart were reviewed this encounter and updated as appropriate:   Tobacco   Allergies   Meds   Problems   Med Hx   Surg Hx   Fam Hx      Review of Systems:  No other skin or systemic complaints except as noted in HPI or Assessment and Plan.  Objective  Well appearing patient in no apparent distress; mood and affect are within normal limits.  A focused examination was performed including face. Relevant physical exam findings are noted in the Assessment and Plan.  right anterior scalp at the forehead line Erythematous thin papules/macules with gritty scale.   right lateral forehead Stuck-on, waxy, tan-brown papule   Assessment & Plan  AK (actinic keratosis) right anterior scalp at the forehead line  Hyperkeratotic Actinic keratoses are precancerous spots that appear secondary to cumulative UV radiation exposure/sun exposure over time. They are chronic with expected duration over 1 year. A portion of actinic keratoses will progress to squamous cell carcinoma of the skin. It is not possible to reliably predict which spots will progress to skin cancer and so treatment is recommended to prevent development of skin cancer.  Recommend daily broad spectrum sunscreen SPF 30+ to sun-exposed areas, reapply every 2 hours as needed.  Recommend staying in the shade or wearing long sleeves, sun glasses (UVA+UVB protection) and wide brim hats (4-inch brim around the entire circumference of the hat). Call for new or changing lesions.   Destruction of lesion - right anterior scalp at the forehead line Complexity: simple    Destruction method: cryotherapy   Informed consent: discussed and consent obtained   Timeout:  patient name, date of birth, surgical site, and procedure verified Lesion destroyed using liquid nitrogen: Yes   Region frozen until ice ball extended beyond lesion: Yes   Outcome: patient tolerated procedure well with no complications   Post-procedure details: wound care instructions given    Inflamed seborrheic keratosis right lateral forehead  Reassured benign age-related growth.  Recommend observation.  Discussed cryotherapy if spot(s) become irritated or inflamed.   Destruction of lesion - right lateral forehead Complexity: simple   Destruction method: cryotherapy   Informed consent: discussed and consent obtained   Timeout:  patient name, date of birth, surgical site, and procedure verified Lesion destroyed using liquid nitrogen: Yes   Region frozen until ice ball extended beyond lesion: Yes   Outcome: patient tolerated procedure well with no complications   Post-procedure details: wound care instructions given    Actinic Damage - chronic, secondary to cumulative UV radiation exposure/sun exposure over time - diffuse scaly erythematous macules with underlying dyspigmentation - Recommend daily broad spectrum sunscreen SPF 30+ to sun-exposed areas, reapply every 2 hours as needed.  - Recommend staying in the shade or wearing long sleeves, sun glasses (UVA+UVB protection) and wide brim hats (4-inch brim around the entire circumference of the hat). - Call for new or changing lesions.   Return in about 2 months (around 05/14/2021) for Aks, ISK .  I, Marye Round, CMA, am acting as scribe for Sarina Ser, MD .  Documentation: I have reviewed the above documentation for accuracy and completeness, and I agree with the above.  Sarina Ser, MD

## 2021-03-20 ENCOUNTER — Encounter: Payer: Self-pay | Admitting: Dermatology

## 2021-03-24 DIAGNOSIS — M25512 Pain in left shoulder: Secondary | ICD-10-CM | POA: Diagnosis not present

## 2021-03-24 DIAGNOSIS — M25511 Pain in right shoulder: Secondary | ICD-10-CM | POA: Diagnosis not present

## 2021-04-03 ENCOUNTER — Ambulatory Visit: Payer: Self-pay | Admitting: *Deleted

## 2021-04-03 NOTE — Telephone Encounter (Signed)
°  Chief Complaint: elevated BP Symptoms: elevated BP, some swimmy headedness Frequency: daily Pertinent Negatives: Patient denies chest pain, headache Disposition: [] ED /[] Urgent Care (no appt availability in office) / [x] Appointment(In office/virtual)/ []  [facility]/ [] Home Care/ [] Refused Recommended Disposition /[] [facility]/ []  Follow-up with PCP Additional Notes:

## 2021-04-03 NOTE — Telephone Encounter (Signed)
Reason for Disposition  Systolic BP  >= 552 OR Diastolic >= 174  Answer Assessment - Initial Assessment Questions 1. BLOOD PRESSURE: "What is the blood pressure?" "Did you take at least two measurements 5 minutes apart?"     165/83,  173/78 P 73              148/78- yesterday 2. ONSET: "When did you take your blood pressure?"     6:35, 9:00 3. HOW: "How did you obtain the blood pressure?" (e.g., visiting nurse, automatic home BP monitor)     Automatic cuff- arm 4. HISTORY: "Do you have a history of high blood pressure?"     yes 5. MEDICATIONS: "Are you taking any medications for blood pressure?" "Have you missed any doses recently?"     Patient reports no missed pills- patient has recently increased dosing 1/11- Amlodipine  6. OTHER SYMPTOMS: "Do you have any symptoms?" (e.g., headache, chest pain, blurred vision, difficulty breathing, weakness)     Swimmy headed at times 7. PREGNANCY: "Is there any chance you are pregnant?" "When was your last menstrual period?"     na  Protocols used: Blood Pressure - High-A-AH

## 2021-04-04 ENCOUNTER — Encounter: Payer: Self-pay | Admitting: Family Medicine

## 2021-04-04 ENCOUNTER — Ambulatory Visit (INDEPENDENT_AMBULATORY_CARE_PROVIDER_SITE_OTHER): Payer: Medicare Other | Admitting: Family Medicine

## 2021-04-04 ENCOUNTER — Other Ambulatory Visit: Payer: Self-pay

## 2021-04-04 VITALS — BP 115/63 | HR 61 | Temp 98.7°F | Resp 16 | Wt 182.4 lb

## 2021-04-04 DIAGNOSIS — E1159 Type 2 diabetes mellitus with other circulatory complications: Secondary | ICD-10-CM

## 2021-04-04 DIAGNOSIS — I152 Hypertension secondary to endocrine disorders: Secondary | ICD-10-CM | POA: Diagnosis not present

## 2021-04-04 DIAGNOSIS — E785 Hyperlipidemia, unspecified: Secondary | ICD-10-CM

## 2021-04-04 DIAGNOSIS — E119 Type 2 diabetes mellitus without complications: Secondary | ICD-10-CM | POA: Diagnosis not present

## 2021-04-04 DIAGNOSIS — E1169 Type 2 diabetes mellitus with other specified complication: Secondary | ICD-10-CM | POA: Diagnosis not present

## 2021-04-04 NOTE — Assessment & Plan Note (Signed)
Pt brought in home lists- still elevated at home, pt flabbergasted home low BP is in office Checked on home machine and BP was 130/79, while pt was talking  Chronic, stable  Denies CP Denies SOB Denies DOE No LE Edema noted on exam Continue medication as previously prescribed Refills stable Seek emergent care if you develop CP, chest pain or chest pressure

## 2021-04-04 NOTE — Assessment & Plan Note (Signed)
Repeat lipid screen Continue to reinforce LDL goal of <70 given HTN and DM

## 2021-04-04 NOTE — Progress Notes (Signed)
Established patient visit   Patient: Luke Rojas   DOB: [birth year]   76 y.o. Male  MRN: [id number] Visit Date: 04/04/2021  Today's healthcare provider: Gwyneth Sprout, FNP   Chief Complaint  Patient presents with   Hypertension   Subjective    HPI  Hypertension, follow-up  BP Readings from Last 3 Encounters:  04/04/21 115/63  03/15/21 (!) 156/67  12/27/20 125/60   Wt Readings from Last 3 Encounters:  04/04/21 182 lb 6.4 oz (82.7 kg)  03/15/21 183 lb 9.6 oz (83.3 kg)  12/27/20 181 lb 12.8 oz (82.5 kg)     He was last seen for hypertension 3 weeks ago.  BP at that visit was 156/67. Management since that visit includes Encouraged to be mindful with timing of medication use  Titrate Norvasc to 10mg - return to clinic in 1 month to assess goal of <130/<80.  He reports excellent compliance with treatment. He is not having side effects.  He is following a Regular diet. He is not exercising. He does not smoke.  Use of agents associated with hypertension: none.   Outside blood pressures are patient reports blood pressure reading at home this morning was 156/76. Symptoms: No chest pain No chest pressure  No palpitations No syncope  No dyspnea No orthopnea  No paroxysmal nocturnal dyspnea No lower extremity edema   Pertinent labs: Lab Results  Component Value Date   CHOL 118 12/04/2019   HDL 32 (L) 12/04/2019   LDLCALC 63 12/04/2019   TRIG 127 12/04/2019   CHOLHDL 3.7 12/04/2019   Lab Results  Component Value Date   NA 138 12/04/2019   K 4.1 12/04/2019   CREATININE 1.33 (H) 12/04/2019   GFRNONAA 52 (L) 12/04/2019   GLUCOSE 84 12/04/2019   TSH 2.930 12/04/2019     The ASCVD Risk score (Arnett DK, et al., 2019) failed to calculate for the following reasons:   The valid total cholesterol range is 130 to 320 mg/dL   ---------------------------------------------------------------------------------------------------   Medications: Outpatient  Medications Prior to Visit  Medication Sig   amLODipine (NORVASC) 5 MG tablet Take 2 tablets (10 mg total) by mouth daily.   atorvastatin (LIPITOR) 80 MG tablet TAKE 1 TABLET BY MOUTH EVERY NIGHT AT BEDTIME   baclofen (LIORESAL) 10 MG tablet Take 1 tablet (10 mg total) by mouth 3 (three) times daily.   clopidogrel (PLAVIX) 75 MG tablet Take 75 mg by mouth daily.   co-enzyme Q-10 30 MG capsule Take 30 mg by mouth daily.    cyclobenzaprine (FLEXERIL) 5 MG tablet Take 1 tablet (5 mg total) by mouth 3 (three) times daily as needed for muscle spasms.   diclofenac sodium (VOLTAREN) 1 % GEL Apply 2 g topically 4 (four) times daily.   finasteride (PROSCAR) 5 MG tablet Take 5 mg by mouth daily.   fluorouracil (EFUDEX) 5 % cream Apply topically 2 (two) times daily.   isosorbide mononitrate (IMDUR) 60 MG 24 hr tablet Take 60 mg by mouth daily.   Krill Oil 1000 MG CAPS Take 1,000 mg by mouth daily.    Meth-Hyo-M Bl-Na Phos-Ph Sal (URIBEL) 118 MG CAPS Take 1 capsule (118 mg total) by mouth every 6 (six) hours as needed (dysuria).   metoprolol tartrate (LOPRESSOR) 25 MG tablet Take 1 tablet (25 mg total) by mouth 2 (two) times daily.   MULTIPLE VITAMIN PO Take 1 tablet by mouth daily.   nitroGLYCERIN (NITROSTAT) 0.4 MG SL tablet Place 1 tablet (0.4  mg total) under the tongue every 5 (five) minutes as needed for chest pain.   Omega-3 Fatty Acids (FISH OIL) 1200 MG CAPS Take 1 capsule by mouth daily.   OneTouch Delica Lancets 67T MISC TEST FASTING GLUCOSE LEVEL EACH MORNING BEFORE BREAKFAST   pantoprazole (PROTONIX) 40 MG tablet Take 40 mg by mouth daily.   ramipril (ALTACE) 10 MG capsule Take 1 capsule (10 mg total) by mouth daily.   tamsulosin (FLOMAX) 0.4 MG CAPS capsule Take 0.4 mg by mouth daily.   No facility-administered medications prior to visit.    Review of Systems     Objective    BP 115/63    Pulse 61    Temp 98.7 F (37.1 C) (Oral)    Resp 16    Wt 182 lb 6.4 oz (82.7 kg)    SpO2 100%     BMI 26.94 kg/m    Physical Exam Vitals and nursing note reviewed.  Constitutional:      Appearance: Normal appearance. He is overweight.  HENT:     Head: Normocephalic and atraumatic.  Eyes:     Pupils: Pupils are equal, round, and reactive to light.  Cardiovascular:     Rate and Rhythm: Normal rate and regular rhythm.     Pulses: Normal pulses.     Heart sounds: Normal heart sounds.  Pulmonary:     Effort: Pulmonary effort is normal.     Breath sounds: Normal breath sounds.  Musculoskeletal:        General: No swelling. Normal range of motion.     Cervical back: Normal range of motion.     Right lower leg: No edema.     Left lower leg: No edema.  Skin:    General: Skin is warm and dry.     Capillary Refill: Capillary refill takes less than 2 seconds.  Neurological:     General: No focal deficit present.     Mental Status: He is alert and oriented to person, place, and time. Mental status is at baseline.  Psychiatric:        Mood and Affect: Mood normal.        Behavior: Behavior normal.        Thought Content: Thought content normal.        Judgment: Judgment normal.     No results found for any visits on 04/04/21.  Assessment & Plan     Problem List Items Addressed This Visit       Cardiovascular and Mediastinum   Hypertension associated with diabetes ([place]) - Primary    Pt brought in home lists- still elevated at home, pt flabbergasted home low BP is in office Checked on home machine and BP was 130/79, while pt was talking  Chronic, stable  Denies CP Denies SOB Denies DOE No LE Edema noted on exam Continue medication as previously prescribed Refills stable Seek emergent care if you develop CP, chest pain or chest pressure       Relevant Orders   Comprehensive metabolic panel     Endocrine   Diabetes mellitus without complication ([place])    Checks BG at home 1-2 times a day  Denies lows Some 'high BG' readings in low 200s Check A1c today Continue  to recommend balanced, lower carb meals. Smaller meal size, adding snacks. Choosing water as drink of choice and increasing purposeful exercise.       Relevant Orders   Hemoglobin A1c   Hyperlipidemia associated with type 2 diabetes  mellitus ([place])    Repeat lipid screen Continue to reinforce LDL goal of <70 given HTN and DM      Relevant Orders   Lipid panel     Return in about 6 months (around 10/02/2021) for chonic disease management.      Vonna Kotyk, FNP, have reviewed all documentation for this visit. The documentation on 04/04/21 for the exam, diagnosis, procedures, and orders are all accurate and complete.  Patient seen and examined by Tally Joe,  FNP note scribed by Jennings Books, [place], [place] [phone] (phone) [phone] (fax)  [place]

## 2021-04-04 NOTE — Assessment & Plan Note (Signed)
Checks BG at home 1-2 times a day  Denies lows Some 'high BG' readings in low 200s Check A1c today Continue to recommend balanced, lower carb meals. Smaller meal size, adding snacks. Choosing water as drink of choice and increasing purposeful exercise.

## 2021-04-05 LAB — COMPREHENSIVE METABOLIC PANEL
ALT: 23 IU/L (ref 0–44)
AST: 21 IU/L (ref 0–40)
Albumin/Globulin Ratio: 1.5 (ref 1.2–2.2)
Albumin: 4 g/dL (ref 3.7–4.7)
Alkaline Phosphatase: 97 IU/L (ref 44–121)
BUN/Creatinine Ratio: 15 (ref 10–24)
BUN: 19 mg/dL (ref 8–27)
Bilirubin Total: 0.5 mg/dL (ref 0.0–1.2)
CO2: 20 mmol/L (ref 20–29)
Calcium: 9.6 mg/dL (ref 8.6–10.2)
Chloride: 102 mmol/L (ref 96–106)
Creatinine, Ser: 1.27 mg/dL (ref 0.76–1.27)
Globulin, Total: 2.6 g/dL (ref 1.5–4.5)
Glucose: 125 mg/dL — ABNORMAL HIGH (ref 70–99)
Potassium: 4 mmol/L (ref 3.5–5.2)
Sodium: 139 mmol/L (ref 134–144)
Total Protein: 6.6 g/dL (ref 6.0–8.5)
eGFR: 59 mL/min/{1.73_m2} — ABNORMAL LOW (ref >59)

## 2021-04-05 LAB — LIPID PANEL
Chol/HDL Ratio: 4.1 ratio (ref 0.0–5.0)
Cholesterol, Total: 122 mg/dL (ref 100–199)
HDL: 30 mg/dL — ABNORMAL LOW (ref >39)
LDL Chol Calc (NIH): 63 mg/dL (ref 0–99)
Triglycerides: 170 mg/dL — ABNORMAL HIGH (ref 0–149)
VLDL Cholesterol Cal: 29 mg/dL (ref 5–40)

## 2021-04-05 LAB — HEMOGLOBIN A1C
Est. average glucose Bld gHb Est-mCnc: 154 mg/dL
Hgb A1c MFr Bld: 7 % — ABNORMAL HIGH (ref 4.8–5.6)

## 2021-04-05 NOTE — Progress Notes (Signed)
Prediabetes has progressed to diabetes; Continue to recommend balanced, lower carb meals. Smaller meal size, adding snacks. Choosing water as drink of choice and increasing purposeful exercise. Can start once daily medication, metformin, if you would like to assist, but level is stable at this time at 7%.  Cholesterol is stable; however, fats/triglycerides elevated and HDL low. Re HDL- Recommend increase in diet of healthier fat choices- low fat meats, oils that are not solid at room temperature, nuts, seeds, fish- cod, halibut, salmon, and avocado. Exercise can also increase this number.  Supplemental omega 3's can be taken as well but are not as helpful as dietary/exercise changes. Re trigs- Recommend lower saturated fats (the solid fats/land animal fats)  Blood chemistry has improved from one year ago; kidneys are looking better.  Please let us know if you have any questions.  Thank you,  Tally Joe, FNP

## 2021-04-07 ENCOUNTER — Telehealth: Payer: Self-pay

## 2021-04-07 NOTE — Telephone Encounter (Signed)
Pt had called in for lab results but disconnected call before Agent transferred to NT. Agent called back x 2 with no answer.

## 2021-04-10 ENCOUNTER — Telehealth: Payer: Self-pay

## 2021-04-10 ENCOUNTER — Other Ambulatory Visit: Payer: Self-pay | Admitting: Family Medicine

## 2021-04-10 ENCOUNTER — Telehealth: Payer: Self-pay | Admitting: Family Medicine

## 2021-04-10 MED ORDER — METFORMIN HCL ER 750 MG PO TB24
750.0000 mg | ORAL_TABLET | Freq: Every day | ORAL | 1 refills | Status: DC
Start: 2021-04-10 — End: 2021-10-10

## 2021-04-10 NOTE — Telephone Encounter (Signed)
Medication Refill - Medication: amLODipine (NORVASC) 5 MG tablet Pt wife stated, per PCP pt was advised to start taking one table at night and one in the morning.  Pt wife was advised by the pharmacy to ask the PCP to send a Rx saying that he has to have two tablets. Pt stated pharmacy stated the refill they have says one tablet per day.   Pt  has 4 tablets left.  Has the patient contacted their pharmacy? Yes.    (Agent: If yes, when and what did the pharmacy advise?)  Preferred Pharmacy (with phone number or street name):   [facility] - [place], [place] [address] [place] [address]  Phone: [phone] Fax: [phone]  Hours: Not open 24 hours    Has the patient been seen for an appointment in the last year OR does the patient have an upcoming appointment? Yes.    Agent: Please be advised that RX refills may take up to 3 business days. We ask that you follow-up with your pharmacy.

## 2021-04-10 NOTE — Telephone Encounter (Signed)
-----   Message from Minette Headland, [place] sent at 04/07/2021  3:23 PM EST ----- Lmtcb okay for [facility] nurse triage to advise. KW

## 2021-04-11 ENCOUNTER — Other Ambulatory Visit: Payer: Self-pay | Admitting: Family Medicine

## 2021-04-11 DIAGNOSIS — I152 Hypertension secondary to endocrine disorders: Secondary | ICD-10-CM

## 2021-04-11 DIAGNOSIS — M25511 Pain in right shoulder: Secondary | ICD-10-CM | POA: Diagnosis not present

## 2021-04-11 DIAGNOSIS — E1159 Type 2 diabetes mellitus with other circulatory complications: Secondary | ICD-10-CM

## 2021-04-11 DIAGNOSIS — M25512 Pain in left shoulder: Secondary | ICD-10-CM | POA: Diagnosis not present

## 2021-04-11 MED ORDER — AMLODIPINE BESYLATE 5 MG PO TABS: 10.0000 mg | ORAL_TABLET | Freq: Every day | ORAL | 1 refills | Status: DC

## 2021-04-12 ENCOUNTER — Ambulatory Visit: Payer: Medicare Other | Admitting: Family Medicine

## 2021-04-12 ENCOUNTER — Other Ambulatory Visit: Payer: Self-pay

## 2021-04-12 DIAGNOSIS — I152 Hypertension secondary to endocrine disorders: Secondary | ICD-10-CM

## 2021-04-12 MED ORDER — AMLODIPINE BESYLATE 10 MG PO TABS: 10.0000 mg | ORAL_TABLET | Freq: Every day | ORAL | 1 refills | Status: DC

## 2021-04-17 ENCOUNTER — Other Ambulatory Visit: Payer: Self-pay

## 2021-04-17 ENCOUNTER — Encounter: Payer: Self-pay | Admitting: Family Medicine

## 2021-04-17 ENCOUNTER — Ambulatory Visit (INDEPENDENT_AMBULATORY_CARE_PROVIDER_SITE_OTHER): Payer: Medicare Other | Admitting: Family Medicine

## 2021-04-17 VITALS — BP 110/66 | HR 79 | Temp 97.6°F | Resp 16 | Wt 181.1 lb

## 2021-04-17 DIAGNOSIS — I2510 Atherosclerotic heart disease of native coronary artery without angina pectoris: Secondary | ICD-10-CM | POA: Diagnosis not present

## 2021-04-17 DIAGNOSIS — E1159 Type 2 diabetes mellitus with other circulatory complications: Secondary | ICD-10-CM

## 2021-04-17 DIAGNOSIS — E1169 Type 2 diabetes mellitus with other specified complication: Secondary | ICD-10-CM

## 2021-04-17 DIAGNOSIS — I152 Hypertension secondary to endocrine disorders: Secondary | ICD-10-CM

## 2021-04-17 NOTE — Assessment & Plan Note (Signed)
Recent A1c 7 On metformin and tolerating well Recheck in 3 months Will go over screenings at next visit

## 2021-04-17 NOTE — Progress Notes (Signed)
Established patient visit   Patient: Luke Rojas   DOB: 07/10/1945   76 y.o. Male  MRN: [id number] Visit Date: 04/17/2021  Today's healthcare provider: Lavon Paganini, MD   Chief Complaint  Patient presents with   Hypertension   Subjective    HPI  Hypertension, follow-up  BP Readings from Last 3 Encounters:  04/17/21 110/66  04/04/21 115/63  03/15/21 (!) 156/67   Wt Readings from Last 3 Encounters:  04/17/21 181 lb 1.6 oz (82.1 kg)  04/04/21 182 lb 6.4 oz (82.7 kg)  03/15/21 183 lb 9.6 oz (83.3 kg)     He was last seen for hypertension 1 months ago.  BP at that visit was 156/67 . Management since that visit includes increase Amlodipine to 50m daily.  He reports excellent compliance with treatment. He is not having side effects.  He is following a Regular diet. He is exercising. He does not smoke.  Use of agents associated with hypertension: none.   Outside blood pressures are elevated. 150s/80s Symptoms: No chest pain No chest pressure  No palpitations No syncope  No dyspnea No orthopnea  No paroxysmal nocturnal dyspnea Yes lower extremity edema   Pertinent labs: Lab Results  Component Value Date   CHOL 122 04/04/2021   HDL 30 (L) 04/04/2021   LDLCALC 63 04/04/2021   TRIG 170 (H) 04/04/2021   CHOLHDL 4.1 04/04/2021   Lab Results  Component Value Date   NA 139 04/04/2021   K 4.0 04/04/2021   CREATININE 1.27 04/04/2021   EGFR 59 (L) 04/04/2021   GLUCOSE 125 (H) 04/04/2021   TSH 2.930 12/04/2019     The ASCVD Risk score (Arnett DK, et al., 2019) failed to calculate for the following reasons:   The valid total cholesterol range is 130 to 320 mg/dL   ---------------------------------------------------------------------------------------------------   Medications: Outpatient Medications Prior to Visit  Medication Sig   amLODipine (NORVASC) 10 MG tablet Take 1 tablet (10 mg total) by mouth daily.   atorvastatin (LIPITOR) 80 MG  tablet TAKE 1 TABLET BY MOUTH EVERY NIGHT AT BEDTIME   clopidogrel (PLAVIX) 75 MG tablet Take 75 mg by mouth daily.   co-enzyme Q-10 30 MG capsule Take 30 mg by mouth daily.    diclofenac sodium (VOLTAREN) 1 % GEL Apply 2 g topically 4 (four) times daily.   Krill Oil 1000 MG CAPS Take 1,000 mg by mouth daily.    metFORMIN (GLUCOPHAGE XR) 750 MG 24 hr tablet Take 1 tablet (750 mg total) by mouth daily with breakfast.   Meth-Hyo-M Bl-Na Phos-Ph Sal (URIBEL) 118 MG CAPS Take 1 capsule (118 mg total) by mouth every 6 (six) hours as needed (dysuria).   metoprolol tartrate (LOPRESSOR) 25 MG tablet Take 1 tablet (25 mg total) by mouth 2 (two) times daily.   MULTIPLE VITAMIN PO Take 1 tablet by mouth daily.   nitroGLYCERIN (NITROSTAT) 0.4 MG SL tablet Place 1 tablet (0.4 mg total) under the tongue every 5 (five) minutes as needed for chest pain.   Omega-3 Fatty Acids (FISH OIL) 1200 MG CAPS Take 1 capsule by mouth daily.   OneTouch Delica Lancets 387FMISC TEST FASTING GLUCOSE LEVEL EACH MORNING BEFORE BREAKFAST   pantoprazole (PROTONIX) 40 MG tablet Take 40 mg by mouth daily.   ramipril (ALTACE) 10 MG capsule Take 1 capsule (10 mg total) by mouth daily.   tamsulosin (FLOMAX) 0.4 MG CAPS capsule Take 0.4 mg by mouth daily.   [DISCONTINUED] baclofen (LIORESAL)  10 MG tablet Take 1 tablet (10 mg total) by mouth 3 (three) times daily. (Patient not taking: Reported on 04/17/2021)   [DISCONTINUED] cyclobenzaprine (FLEXERIL) 5 MG tablet Take 1 tablet (5 mg total) by mouth 3 (three) times daily as needed for muscle spasms. (Patient not taking: Reported on 04/17/2021)   [DISCONTINUED] finasteride (PROSCAR) 5 MG tablet Take 5 mg by mouth daily. (Patient not taking: Reported on 04/17/2021)   [DISCONTINUED] fluorouracil (EFUDEX) 5 % cream Apply topically 2 (two) times daily. (Patient not taking: Reported on 04/17/2021)   [DISCONTINUED] isosorbide mononitrate (IMDUR) 60 MG 24 hr tablet Take 60 mg by mouth daily. (Patient  not taking: Reported on 04/17/2021)   No facility-administered medications prior to visit.    Review of Systems per HPI      Objective    BP 110/66 (BP Location: Left Arm, Patient Position: Sitting, Cuff Size: Large)    Pulse 79    Temp 97.6 F (36.4 C) (Temporal)    Resp 16    Wt 181 lb 1.6 oz (82.1 kg)    SpO2 99%    BMI 26.74 kg/m  BP Readings from Last 3 Encounters:  04/17/21 110/66  04/04/21 115/63  03/15/21 (!) 156/67   Wt Readings from Last 3 Encounters:  04/17/21 181 lb 1.6 oz (82.1 kg)  04/04/21 182 lb 6.4 oz (82.7 kg)  03/15/21 183 lb 9.6 oz (83.3 kg)      Physical Exam Vitals reviewed.  Constitutional:      General: He is not in acute distress.    Appearance: Normal appearance. He is not diaphoretic.  HENT:     Head: Normocephalic and atraumatic.  Eyes:     General: No scleral icterus.    Conjunctiva/sclera: Conjunctivae normal.  Cardiovascular:     Rate and Rhythm: Normal rate and regular rhythm.     Pulses: Normal pulses.     Heart sounds: Normal heart sounds. No murmur heard. Pulmonary:     Effort: Pulmonary effort is normal. No respiratory distress.     Breath sounds: Normal breath sounds. No wheezing or rhonchi.  Musculoskeletal:     Cervical back: Neck supple.     Right lower leg: No edema.     Left lower leg: No edema.  Lymphadenopathy:     Cervical: No cervical adenopathy.  Skin:    General: Skin is warm and dry.     Capillary Refill: Capillary refill takes less than 2 seconds.     Findings: No rash.  Neurological:     Mental Status: He is alert and oriented to person, place, and time.     Cranial Nerves: No cranial nerve deficit.  Psychiatric:        Mood and Affect: Mood normal.        Behavior: Behavior normal.      No results found for any visits on 04/17/21.  Assessment & Plan     Problem List Items Addressed This Visit       Cardiovascular and Mediastinum   CAD (coronary artery disease)    F/b cardiology Asymptomatic No  changes to meds      Hypertension associated with diabetes ([place])    Well controlled on our readings Home readings are elevated, but he may be elevating during sleep Advised on proper home BP taking  He will continue to monitor  No medication changes today        Endocrine   T2DM (type 2 diabetes mellitus) ([place]) - Primary  Recent A1c 7 On metformin and tolerating well Recheck in 3 months Will go over screenings at next visit        Return in about 3 months (around 07/15/2021) for chronic disease f/u.      I, Lavon Paganini, MD, have reviewed all documentation for this visit. The documentation on 04/17/21 for the exam, diagnosis, procedures, and orders are all accurate and complete.   Atalie Oros, Dionne Bucy, MD, MPH [facility]

## 2021-04-17 NOTE — Assessment & Plan Note (Signed)
F/b cardiology Asymptomatic No changes to meds

## 2021-04-17 NOTE — Assessment & Plan Note (Signed)
Well controlled on our readings Home readings are elevated, but he may be elevating during sleep Advised on proper home BP taking  He will continue to monitor  No medication changes today

## 2021-04-21 DIAGNOSIS — L739 Follicular disorder, unspecified: Secondary | ICD-10-CM | POA: Diagnosis not present

## 2021-04-25 DIAGNOSIS — Z79899 Other long term (current) drug therapy: Secondary | ICD-10-CM | POA: Diagnosis not present

## 2021-04-25 DIAGNOSIS — N3020 Other chronic cystitis without hematuria: Secondary | ICD-10-CM | POA: Diagnosis not present

## 2021-05-05 DIAGNOSIS — M25512 Pain in left shoulder: Secondary | ICD-10-CM | POA: Diagnosis not present

## 2021-05-05 DIAGNOSIS — M25511 Pain in right shoulder: Secondary | ICD-10-CM | POA: Diagnosis not present

## 2021-05-10 DIAGNOSIS — M25511 Pain in right shoulder: Secondary | ICD-10-CM | POA: Diagnosis not present

## 2021-05-11 DIAGNOSIS — M25511 Pain in right shoulder: Secondary | ICD-10-CM | POA: Diagnosis not present

## 2021-05-11 DIAGNOSIS — M67813 Other specified disorders of tendon, right shoulder: Secondary | ICD-10-CM | POA: Diagnosis not present

## 2021-05-18 ENCOUNTER — Ambulatory Visit: Payer: Medicare Other | Admitting: Dermatology

## 2021-06-14 DIAGNOSIS — R300 Dysuria: Secondary | ICD-10-CM | POA: Diagnosis not present

## 2021-06-14 DIAGNOSIS — N3020 Other chronic cystitis without hematuria: Secondary | ICD-10-CM | POA: Diagnosis not present

## 2021-06-14 DIAGNOSIS — R3914 Feeling of incomplete bladder emptying: Secondary | ICD-10-CM | POA: Diagnosis not present

## 2021-06-14 DIAGNOSIS — N23 Unspecified renal colic: Secondary | ICD-10-CM | POA: Diagnosis not present

## 2021-06-15 ENCOUNTER — Ambulatory Visit: Payer: Medicare Other | Admitting: Dermatology

## 2021-06-16 ENCOUNTER — Other Ambulatory Visit: Payer: Self-pay | Admitting: Urology

## 2021-06-16 DIAGNOSIS — R109 Unspecified abdominal pain: Secondary | ICD-10-CM

## 2021-06-16 DIAGNOSIS — R319 Hematuria, unspecified: Secondary | ICD-10-CM

## 2021-06-19 ENCOUNTER — Ambulatory Visit
Admission: RE | Admit: 2021-06-19 | Discharge: 2021-06-19 | Disposition: A | Discharge status: Home / Self Care | Payer: Medicare Other | Source: Ambulatory Visit | Attending: Urology | Admitting: Urology

## 2021-06-19 DIAGNOSIS — R319 Hematuria, unspecified: Secondary | ICD-10-CM

## 2021-06-19 DIAGNOSIS — R109 Unspecified abdominal pain: Secondary | ICD-10-CM | POA: Diagnosis not present

## 2021-06-19 LAB — POCT I-STAT CREATININE: Creatinine, Ser: 1.5 mg/dL — ABNORMAL HIGH (ref 0.61–1.24)

## 2021-06-19 MED ORDER — IOHEXOL 300 MG/ML  SOLN
100.0000 mL | Freq: Once | INTRAMUSCULAR | Status: AC | PRN
  Administered 2021-06-19: 100 mL via INTRAVENOUS

## 2021-06-21 DIAGNOSIS — R109 Unspecified abdominal pain: Secondary | ICD-10-CM | POA: Diagnosis not present

## 2021-06-21 DIAGNOSIS — N390 Urinary tract infection, site not specified: Secondary | ICD-10-CM | POA: Diagnosis not present

## 2021-06-21 DIAGNOSIS — M5489 Other dorsalgia: Secondary | ICD-10-CM | POA: Diagnosis not present

## 2021-06-26 ENCOUNTER — Other Ambulatory Visit: Payer: Medicare Other

## 2021-07-03 ENCOUNTER — Ambulatory Visit: Payer: Medicare Other | Admitting: Dermatology

## 2021-07-03 ENCOUNTER — Ambulatory Visit: Payer: Self-pay | Admitting: *Deleted

## 2021-07-03 ENCOUNTER — Encounter: Payer: Self-pay | Admitting: Family Medicine

## 2021-07-03 ENCOUNTER — Ambulatory Visit (INDEPENDENT_AMBULATORY_CARE_PROVIDER_SITE_OTHER): Payer: Medicare Other | Admitting: Family Medicine

## 2021-07-03 VITALS — BP 130/71 | HR 64 | Temp 97.7°F | Resp 14 | Wt 179.0 lb

## 2021-07-03 DIAGNOSIS — M5116 Intervertebral disc disorders with radiculopathy, lumbar region: Secondary | ICD-10-CM | POA: Diagnosis not present

## 2021-07-03 DIAGNOSIS — M545 Low back pain, unspecified: Secondary | ICD-10-CM

## 2021-07-03 MED ORDER — CELECOXIB 100 MG PO CAPS: 100.0000 mg | ORAL_CAPSULE | Freq: Two times a day (BID) | ORAL | 1 refills | Status: DC

## 2021-07-03 NOTE — Telephone Encounter (Signed)
? ? ?  Attempted to reach pt, left VM to call back. ? ?Pt called in stated pain in legs and swelling while looking for an appointment for the pt the call dropped.  ?Called pt back no answer.  ?

## 2021-07-03 NOTE — Progress Notes (Signed)
I,Roshena L Chambers,acting as a scribe for Lelon Huh, MD.,have documented all relevant documentation on the behalf of Lelon Huh, MD,as directed by  Lelon Huh, MD while in the presence of Lelon Huh, MD.    Established patient visit   Patient: Luke Rojas   DOB: 1945/04/21   76 y.o. Male  MRN: [id number] Visit Date: 07/03/2021  Today's healthcare provider: Lelon Huh, MD   Chief Complaint  Patient presents with   Leg Pain   Subjective    Leg Pain  Incident onset: chronic pain; worsened in the past 3 weeks. The pain is present in the left leg and right leg (lower back down to both knees). The pain has been Intermittent since onset. The symptoms are aggravated by weight bearing (walking and standing). Treatments tried: cortisone injections. The treatment provided moderate relief.   Had 2 steroid injection in his hips by Dr. Oren Section at [facility] over the last couple of months. He states he also had a shoulder injection and told he can't have any more steroid injections. He does have an established of significant lumbar DDD/ severe spondylosis with significant MRI findings in 2019 and follow up x-rays. He was previously on meloxicam which was discontinued in 2020.   Medications: Outpatient Medications Prior to Visit  Medication Sig   amLODipine (NORVASC) 10 MG tablet Take 1 tablet (10 mg total) by mouth daily.   atorvastatin (LIPITOR) 80 MG tablet TAKE 1 TABLET BY MOUTH EVERY NIGHT AT BEDTIME   clopidogrel (PLAVIX) 75 MG tablet Take 75 mg by mouth daily.   co-enzyme Q-10 30 MG capsule Take 30 mg by mouth daily.    diclofenac sodium (VOLTAREN) 1 % GEL Apply 2 g topically 4 (four) times daily.   Krill Oil 1000 MG CAPS Take 1,000 mg by mouth daily.    metFORMIN (GLUCOPHAGE XR) 750 MG 24 hr tablet Take 1 tablet (750 mg total) by mouth daily with breakfast.   Meth-Hyo-M Bl-Na Phos-Ph Sal (URIBEL) 118 MG CAPS Take 1 capsule (118 mg total) by mouth every 6 (six)  hours as needed (dysuria).   metoprolol tartrate (LOPRESSOR) 25 MG tablet Take 1 tablet (25 mg total) by mouth 2 (two) times daily.   MULTIPLE VITAMIN PO Take 1 tablet by mouth daily.   nitroGLYCERIN (NITROSTAT) 0.4 MG SL tablet Place 1 tablet (0.4 mg total) under the tongue every 5 (five) minutes as needed for chest pain.   Omega-3 Fatty Acids (FISH OIL) 1200 MG CAPS Take 1 capsule by mouth daily.   OneTouch Delica Lancets 57Q MISC TEST FASTING GLUCOSE LEVEL EACH MORNING BEFORE BREAKFAST   pantoprazole (PROTONIX) 40 MG tablet Take 40 mg by mouth daily.   ramipril (ALTACE) 10 MG capsule Take 1 capsule (10 mg total) by mouth daily.   tamsulosin (FLOMAX) 0.4 MG CAPS capsule Take 0.4 mg by mouth daily.   No facility-administered medications prior to visit.    Review of Systems  Constitutional:  Negative for appetite change, chills and fever.  Respiratory:  Negative for chest tightness, shortness of breath and wheezing.   Cardiovascular:  Negative for chest pain and palpitations.  Gastrointestinal:  Negative for abdominal pain, nausea and vomiting.      Objective    BP 130/71 (BP Location: Left Arm, Patient Position: Sitting, Cuff Size: Large)   Pulse 64   Temp 97.7 F (36.5 C) (Oral)   Resp 14   Wt 179 lb (81.2 kg)   SpO2 99% Comment: room air  BMI 26.43 kg/m    Physical Exam   Diffusely tender midline lumbar spine and bilateral SI joints extending to lateral hips. Slow to stand and pain with ambulation.     Assessment & Plan     1. Low back pain, unspecified back pain laterality, unspecified chronicity, unspecified whether sciatica present   2. Lumbar disc disease with radiculopathy   Could try oral steroids or tramadol, but concerned that steroid would only provide short term relief and pain is chronic, and he would rather not take anything habit forming. Previously on meloxicam which he does not remember causing any trouble. Was probably d/c due to period of declining  renal functions and initiation of clopidogrel. Counseled regarding risk of GI bleeding and AKI with NSAIDs. He is on chronic PPI and encouraged to push fluids as much as possible. Will try low dose celecoxib (CELEBREX) 100 MG capsule; Take 1 capsule (100 mg total) by mouth 2 (two) times daily.  Dispense: 60 capsule; Refill: 1 and counseled to take only prn. He has follow up with Dr. Brita Romp in a couple of weeks and advised will need to keep a close eye on kidney functions if he stays on NSAID.         The entirety of the information documented in the History of Present Illness, Review of Systems and Physical Exam were personally obtained by me. Portions of this information were initially documented by the CMA and reviewed by me for thoroughness and accuracy.     Lelon Huh, MD  [facility] [phone] (phone) [phone] (fax)  [place]

## 2021-07-14 NOTE — Progress Notes (Signed)
?  ? ?I,Sulibeya S Dimas,acting as a scribe for Lavon Paganini, MD.,have documented all relevant documentation on the behalf of Lavon Paganini, MD,as directed by  Lavon Paganini, MD while in the presence of Lavon Paganini, MD. ? ? ?Established patient visit ? ? ?Patient: Luke Rojas   DOB: 1945-08-23   76 y.o. Male  MRN: [id number] ?Visit Date: 07/17/2021 ? ?Today's healthcare provider: Lavon Paganini, MD  ? ?Chief Complaint  ?Patient presents with  ? Diabetes  ? ?Subjective  ?  ?HPI  ?Diabetes Mellitus Type II, Follow-up ? ?Lab Results  ?Component Value Date  ? HGBA1C 5.6 07/17/2021  ? HGBA1C 7.0 (H) 04/04/2021  ? HGBA1C 6.3 (H) 12/04/2019  ? ?Wt Readings from Last 3 Encounters:  ?07/17/21 179 lb (81.2 kg)  ?07/03/21 179 lb (81.2 kg)  ?04/17/21 181 lb 1.6 oz (82.1 kg)  ? ?Last seen for diabetes 3 months ago.  ?Management since then includes no changes. ?He reports excellent compliance with treatment. ?He is not having side effects.  ?Symptoms: ?Yes fatigue No foot ulcerations  ?No appetite changes No nausea  ?Yes paresthesia of the feet  No polydipsia  ?No polyuria No visual disturbances   ?No vomiting   ? ? ?Home blood sugar records: fasting range: 110s ? ?Episodes of hypoglycemia? No  ?  ?Current insulin regiment: none ?Most Recent Eye Exam: UTD ?Current exercise: gardening, walking, and yard work ?Current diet habits: in general, a "healthy" diet   ? ?Pertinent Labs: ?Lab Results  ?Component Value Date  ? CHOL 122 04/04/2021  ? HDL 30 (L) 04/04/2021  ? Dexter 63 04/04/2021  ? TRIG 170 (H) 04/04/2021  ? CHOLHDL 4.1 04/04/2021  ? Lab Results  ?Component Value Date  ? NA 139 04/04/2021  ? K 4.0 04/04/2021  ? CREATININE 1.50 (H) 06/19/2021  ? EGFR 59 (L) 04/04/2021  ?  ? ?--------------------------------------------------------------------------------------------------- ?Follow up for low back pain/leg pain ? ?The patient was last seen for this 2 weeks ago. ?Changes made at last visit include  start celecbrex 147m BID. ? ?He reports excellent compliance with treatment. ?He feels that condition is Unchanged. ?He is not having side effects.  ? ?----------------------------------------------------------------------------------------- ? ? ?Medications: ?Outpatient Medications Prior to Visit  ?Medication Sig  ? amLODipine (NORVASC) 10 MG tablet Take 1 tablet (10 mg total) by mouth daily.  ? atorvastatin (LIPITOR) 80 MG tablet TAKE 1 TABLET BY MOUTH EVERY NIGHT AT BEDTIME  ? clopidogrel (PLAVIX) 75 MG tablet Take 75 mg by mouth daily.  ? co-enzyme Q-10 30 MG capsule Take 30 mg by mouth daily.   ? diclofenac sodium (VOLTAREN) 1 % GEL Apply 2 g topically 4 (four) times daily.  ? Krill Oil 1000 MG CAPS Take 1,000 mg by mouth daily.   ? metFORMIN (GLUCOPHAGE XR) 750 MG 24 hr tablet Take 1 tablet (750 mg total) by mouth daily with breakfast.  ? Meth-Hyo-M Bl-Na Phos-Ph Sal (URIBEL) 118 MG CAPS Take 1 capsule (118 mg total) by mouth every 6 (six) hours as needed (dysuria).  ? metoprolol tartrate (LOPRESSOR) 25 MG tablet Take 1 tablet (25 mg total) by mouth 2 (two) times daily.  ? MULTIPLE VITAMIN PO Take 1 tablet by mouth daily.  ? nitroGLYCERIN (NITROSTAT) 0.4 MG SL tablet Place 1 tablet (0.4 mg total) under the tongue every 5 (five) minutes as needed for chest pain.  ? Omega-3 Fatty Acids (FISH OIL) 1200 MG CAPS Take 1 capsule by mouth daily.  ? OneTouch Delica Lancets 338TMISC  TEST FASTING GLUCOSE LEVEL EACH MORNING BEFORE BREAKFAST  ? pantoprazole (PROTONIX) 40 MG tablet Take 40 mg by mouth daily.  ? ramipril (ALTACE) 10 MG capsule Take 1 capsule (10 mg total) by mouth daily.  ? tamsulosin (FLOMAX) 0.4 MG CAPS capsule Take 0.4 mg by mouth daily.  ? [DISCONTINUED] celecoxib (CELEBREX) 100 MG capsule Take 1 capsule (100 mg total) by mouth 2 (two) times daily.  ? ?No facility-administered medications prior to visit.  ? ? ?Review of Systems  ?Constitutional:  Positive for fatigue. Negative for appetite change.   ?Respiratory:  Negative for shortness of breath.   ?Cardiovascular:  Positive for leg swelling. Negative for chest pain.  ?Musculoskeletal:  Positive for myalgias.  ? ?  ?  Objective  ?  ?BP 120/70 (BP Location: Left Arm, Patient Position: Sitting, Cuff Size: Large)   Pulse 70   Temp 98.1 ?F (36.7 ?C) (Oral)   Resp 16   Wt 179 lb (81.2 kg)   BMI 26.43 kg/m?  ?BP Readings from Last 3 Encounters:  ?07/17/21 120/70  ?07/03/21 130/71  ?04/17/21 110/66  ? ?Wt Readings from Last 3 Encounters:  ?07/17/21 179 lb (81.2 kg)  ?07/03/21 179 lb (81.2 kg)  ?04/17/21 181 lb 1.6 oz (82.1 kg)  ? ?  ? ?Physical Exam ?Vitals reviewed.  ?Constitutional:   ?   General: He is not in acute distress. ?   Appearance: Normal appearance. He is not diaphoretic.  ?HENT:  ?   Head: Normocephalic and atraumatic.  ?Eyes:  ?   General: No scleral icterus. ?   Conjunctiva/sclera: Conjunctivae normal.  ?Cardiovascular:  ?   Rate and Rhythm: Normal rate and regular rhythm.  ?   Pulses: Normal pulses.  ?   Heart sounds: Normal heart sounds. No murmur heard. ?Pulmonary:  ?   Effort: Pulmonary effort is normal. No respiratory distress.  ?   Breath sounds: Normal breath sounds. No wheezing or rhonchi.  ?Abdominal:  ?   General: There is no distension.  ?   Palpations: Abdomen is soft.  ?   Tenderness: There is no abdominal tenderness.  ?Musculoskeletal:  ?   Cervical back: Neck supple.  ?   Right lower leg: Edema (trace) present.  ?   Left lower leg: Edema (trace) present.  ?Lymphadenopathy:  ?   Cervical: No cervical adenopathy.  ?Skin: ?   General: Skin is warm and dry.  ?Neurological:  ?   Mental Status: He is alert and oriented to person, place, and time.  ?Psychiatric:     ?   Mood and Affect: Mood normal.     ?   Behavior: Behavior normal.  ?  ? ?Results for orders placed or performed in visit on 07/17/21  ?POCT glycosylated hemoglobin (Hb A1C)  ?Result Value Ref Range  ? Hemoglobin A1C 5.6 4.0 - 5.6 %  ? Est. average glucose Bld gHb Est-mCnc  114   ? ? Assessment & Plan  ?  ? ?Problem List Items Addressed This Visit   ? ?  ? Cardiovascular and Mediastinum  ? Hypertension associated with diabetes ([place])  ?  Well controlled ?Continue current medications ?Reviewed recent metabolic panel ?F/u in 6 months  ?  ?  ? Senile purpura ([place])  ?  ? Digestive  ? Diverticulosis  ?  Noted to have diverticulitis incidentally on CT abd/pelvs with urology ?States he was treated with antibiotics ?He is and has been asymptomatic ?No need for further treatment or imaging needed at  this time ? ?  ?  ?  ? Endocrine  ? T2DM (type 2 diabetes mellitus) ([place]) - Primary  ?  Chronic and well controlled ?Continue current medications ?UTD on vaccines, eye exam, foot exam (completed today) ?uACR today ?On ACEi/ARB  ?On Statin ?Discussed diet and exercise ?F/u in 6 months  ?  ?  ? Relevant Orders  ? POCT glycosylated hemoglobin (Hb A1C) (Completed)  ? Urine Microalbumin w/creat. ratio  ? Hyperlipidemia associated with type 2 diabetes mellitus ([place])  ?  Previously well controlled ?Continue statin ?Repeat FLP and CMP ?Goal LDL < 70 ?  ?  ?  ? Nervous and Auditory  ? Lumbar disc disease with radiculopathy  ?  Chronic and uncontrolled ?States he has received steroid shots previously by Ortho - advised f/u  ?Reviewed recent abd CT and L spine Xrays that show significant DJD ?Stop celebrex as it is not helpful ?Trial of gabapentin 197m TID (start with qhs dosing for first few nights) ?Can dose titrate to 3071mTID if needed in interim ? ? ?  ?  ? Relevant Medications  ? gabapentin (NEURONTIN) 100 MG capsule  ?  ? ?Return in about 6 months (around 01/17/2022) for AWV, CPE.  ?   ? ?I, AnLavon PaganiniMD, have reviewed all documentation for this visit. The documentation on 07/17/21 for the exam, diagnosis, procedures, and orders are all accurate and complete. ? ? ?BaVirginia CrewsMD, MPH ?[facility]   ?

## 2021-07-17 ENCOUNTER — Encounter: Payer: Self-pay | Admitting: Family Medicine

## 2021-07-17 ENCOUNTER — Ambulatory Visit (INDEPENDENT_AMBULATORY_CARE_PROVIDER_SITE_OTHER): Payer: Medicare Other | Admitting: Family Medicine

## 2021-07-17 VITALS — BP 120/70 | HR 70 | Temp 98.1°F | Resp 16 | Wt 179.0 lb

## 2021-07-17 DIAGNOSIS — E1159 Type 2 diabetes mellitus with other circulatory complications: Secondary | ICD-10-CM

## 2021-07-17 DIAGNOSIS — K5790 Diverticulosis of intestine, part unspecified, without perforation or abscess without bleeding: Secondary | ICD-10-CM | POA: Diagnosis not present

## 2021-07-17 DIAGNOSIS — D692 Other nonthrombocytopenic purpura: Secondary | ICD-10-CM

## 2021-07-17 DIAGNOSIS — E785 Hyperlipidemia, unspecified: Secondary | ICD-10-CM | POA: Diagnosis not present

## 2021-07-17 DIAGNOSIS — M5116 Intervertebral disc disorders with radiculopathy, lumbar region: Secondary | ICD-10-CM

## 2021-07-17 DIAGNOSIS — E1169 Type 2 diabetes mellitus with other specified complication: Secondary | ICD-10-CM

## 2021-07-17 DIAGNOSIS — I152 Hypertension secondary to endocrine disorders: Secondary | ICD-10-CM

## 2021-07-17 LAB — POCT GLYCOSYLATED HEMOGLOBIN (HGB A1C)
Est. average glucose Bld gHb Est-mCnc: 114
Hemoglobin A1C: 5.6 % (ref 4.0–5.6)

## 2021-07-17 MED ORDER — GABAPENTIN 100 MG PO CAPS: 100.0000 mg | ORAL_CAPSULE | Freq: Three times a day (TID) | ORAL | 3 refills | Status: DC

## 2021-07-17 NOTE — Assessment & Plan Note (Signed)
Well controlled Continue current medications Reviewed recent metabolic panel F/u in 6 months  

## 2021-07-17 NOTE — Assessment & Plan Note (Signed)
Noted to have diverticulitis incidentally on CT abd/pelvs with urology ?States he was treated with antibiotics ?He is and has been asymptomatic ?No need for further treatment or imaging needed at this time ?

## 2021-07-17 NOTE — Assessment & Plan Note (Signed)
Chronic and uncontrolled ?States he has received steroid shots previously by Ortho - advised f/u  ?Reviewed recent abd CT and L spine Xrays that show significant DJD ?Stop celebrex as it is not helpful ?Trial of gabapentin 100mg  TID (start with qhs dosing for first few nights) ?Can dose titrate to 300mg  TID if needed in interim ? ?

## 2021-07-17 NOTE — Assessment & Plan Note (Signed)
Chronic and well controlled ?Continue current medications ?UTD on vaccines, eye exam, foot exam (completed today) ?uACR today ?On ACEi/ARB  ?On Statin ?Discussed diet and exercise ?F/u in 6 months  ?

## 2021-07-17 NOTE — Assessment & Plan Note (Signed)
Previously well controlled Continue statin Repeat FLP and CMP Goal LDL < 70 

## 2021-07-18 LAB — MICROALBUMIN / CREATININE URINE RATIO
Creatinine, Urine: 186.1 mg/dL
Microalb/Creat Ratio: 7 mg/g creat (ref 0–29)
Microalbumin, Urine: 13.8 ug/mL

## 2021-07-18 LAB — SPECIMEN STATUS REPORT

## 2021-07-21 ENCOUNTER — Ambulatory Visit: Payer: Self-pay | Admitting: *Deleted

## 2021-07-21 MED ORDER — GABAPENTIN 300 MG PO CAPS: 300.0000 mg | ORAL_CAPSULE | Freq: Three times a day (TID) | ORAL | 1 refills | Status: DC

## 2021-07-21 NOTE — Addendum Note (Signed)
Addended by: Shawna Orleans on: 07/21/2021 02:19 PM   Modules accepted: Orders

## 2021-07-21 NOTE — Telephone Encounter (Signed)
  Chief Complaint: medication management- gabapentin not helping Symptoms: back pain, lag,ankle, foot pain, still the same Frequency: worse in am Pertinent Negatives: Patient denies chest pain, breathing difficulty, rash, fever, numbness, weakness Disposition: [] ED /[] Urgent Care (no appt availability in office) / [] Appointment(In office/virtual)/ []  [facility]/ [] Home Care/ [] Refused Recommended Disposition /[] [facility]/ [x]  Follow-up with PCP Additional Notes: PATIENT WAS SEEN 5/15 AND WAS TOLD TO CALL BACK IF MEDICATION CHANGE DID NOT HELP- PATIENT IS REPORTING NO CHANGE   Reason for Disposition  [1] MODERATE pain (e.g., interferes with normal activities, limping) AND [2] present > 3 days  Answer Assessment - Initial Assessment Questions 1. ONSET: "When did the pain start?"      3 weeks or more- fine when sitting- getting up to walk- back of legs ache 2. LOCATION: "Where is the pain located?"      Back and back of legs 3. PAIN: "How bad is the pain?"    (Scale 1-10; or mild, moderate, severe)   -  MILD (1-3): doesn't interfere with normal activities    -  MODERATE (4-7): interferes with normal activities (e.g., work or school) or awakens from sleep, limping    -  SEVERE (8-10): excruciating pain, unable to do any normal activities, unable to walk     moderate 4. WORK OR EXERCISE: "Has there been any recent work or exercise that involved this part of the body?"        5. CAUSE: "What do you think is causing the leg pain?"     Arthritis, sciatic nerve 6. OTHER SYMPTOMS: "Do you have any other symptoms?" (e.g., chest pain, back pain, breathing difficulty, swelling, rash, fever, numbness, weakness)     Back pain, feet and ankles swelling some-1 month, stay cold all the time 7. PREGNANCY: "Is there any chance you are pregnant?" "When was your last menstrual period?"  Protocols used: Leg Pain-A-AH

## 2021-07-21 NOTE — Telephone Encounter (Signed)
Can increase gabapentin to 300mg  TID - please send new Rx

## 2021-07-21 NOTE — Telephone Encounter (Signed)
Patient advised.  new prescription sent in to pharmacy.

## 2021-07-26 ENCOUNTER — Telehealth: Payer: Self-pay | Admitting: Family Medicine

## 2021-07-26 NOTE — Telephone Encounter (Signed)
Copied from [place] [id number]. Topic: Medicare AWV >> Jul 26, 2021  9:20 AM Cher Nakai R wrote: Reason for CRM:  Left message for patient to call back and schedule Medicare Annual Wellness Visit (AWV) in office.   If unable to come into the office for AWV,  please offer to do virtually or by telephone.  Last AWV: /19/2022  Please schedule at anytime with [facility].  30 minute appointment for Virtual or phone 45 minute appointment for in office or Initial virtual/phone  Any questions, please contact me at [phone]

## 2021-07-26 NOTE — Telephone Encounter (Signed)
Pt called in, doesn't feel the ned to have AWV appt. Please call back if necessary to discuss.

## 2021-07-27 DIAGNOSIS — I10 Essential (primary) hypertension: Secondary | ICD-10-CM | POA: Diagnosis not present

## 2021-07-27 DIAGNOSIS — E782 Mixed hyperlipidemia: Secondary | ICD-10-CM | POA: Diagnosis not present

## 2021-07-27 DIAGNOSIS — M79604 Pain in right leg: Secondary | ICD-10-CM | POA: Diagnosis not present

## 2021-07-27 DIAGNOSIS — J449 Chronic obstructive pulmonary disease, unspecified: Secondary | ICD-10-CM | POA: Diagnosis not present

## 2021-07-27 DIAGNOSIS — M5431 Sciatica, right side: Secondary | ICD-10-CM | POA: Diagnosis not present

## 2021-07-27 DIAGNOSIS — M79605 Pain in left leg: Secondary | ICD-10-CM | POA: Diagnosis not present

## 2021-07-27 DIAGNOSIS — Z955 Presence of coronary angioplasty implant and graft: Secondary | ICD-10-CM | POA: Diagnosis not present

## 2021-07-27 DIAGNOSIS — M79671 Pain in right foot: Secondary | ICD-10-CM | POA: Diagnosis not present

## 2021-07-27 DIAGNOSIS — I2510 Atherosclerotic heart disease of native coronary artery without angina pectoris: Secondary | ICD-10-CM | POA: Diagnosis not present

## 2021-07-27 DIAGNOSIS — E119 Type 2 diabetes mellitus without complications: Secondary | ICD-10-CM | POA: Diagnosis not present

## 2021-07-27 DIAGNOSIS — M5432 Sciatica, left side: Secondary | ICD-10-CM | POA: Diagnosis not present

## 2021-07-27 DIAGNOSIS — M79672 Pain in left foot: Secondary | ICD-10-CM | POA: Diagnosis not present

## 2021-08-10 DIAGNOSIS — M79671 Pain in right foot: Secondary | ICD-10-CM | POA: Diagnosis not present

## 2021-08-10 DIAGNOSIS — M79605 Pain in left leg: Secondary | ICD-10-CM | POA: Diagnosis not present

## 2021-08-10 DIAGNOSIS — M79672 Pain in left foot: Secondary | ICD-10-CM | POA: Diagnosis not present

## 2021-08-10 DIAGNOSIS — M79604 Pain in right leg: Secondary | ICD-10-CM | POA: Diagnosis not present

## 2021-08-17 ENCOUNTER — Other Ambulatory Visit: Payer: Self-pay

## 2021-08-17 ENCOUNTER — Encounter: Payer: Self-pay | Admitting: *Deleted

## 2021-08-17 ENCOUNTER — Emergency Department
Admission: EM | Admit: 2021-08-17 | Discharge: 2021-08-17 | Disposition: A | Discharge status: Home / Self Care | Payer: Medicare Other | Attending: Emergency Medicine | Admitting: Emergency Medicine

## 2021-08-17 ENCOUNTER — Emergency Department: Payer: Medicare Other

## 2021-08-17 ENCOUNTER — Encounter: Payer: Self-pay | Admitting: Emergency Medicine

## 2021-08-17 DIAGNOSIS — M47812 Spondylosis without myelopathy or radiculopathy, cervical region: Secondary | ICD-10-CM | POA: Diagnosis not present

## 2021-08-17 DIAGNOSIS — Z955 Presence of coronary angioplasty implant and graft: Secondary | ICD-10-CM | POA: Insufficient documentation

## 2021-08-17 DIAGNOSIS — M4312 Spondylolisthesis, cervical region: Secondary | ICD-10-CM | POA: Diagnosis not present

## 2021-08-17 DIAGNOSIS — I2510 Atherosclerotic heart disease of native coronary artery without angina pectoris: Secondary | ICD-10-CM | POA: Insufficient documentation

## 2021-08-17 DIAGNOSIS — J449 Chronic obstructive pulmonary disease, unspecified: Secondary | ICD-10-CM | POA: Diagnosis not present

## 2021-08-17 DIAGNOSIS — I6523 Occlusion and stenosis of bilateral carotid arteries: Secondary | ICD-10-CM | POA: Diagnosis not present

## 2021-08-17 DIAGNOSIS — R918 Other nonspecific abnormal finding of lung field: Secondary | ICD-10-CM

## 2021-08-17 DIAGNOSIS — I672 Cerebral atherosclerosis: Secondary | ICD-10-CM | POA: Diagnosis not present

## 2021-08-17 DIAGNOSIS — J929 Pleural plaque without asbestos: Secondary | ICD-10-CM | POA: Diagnosis not present

## 2021-08-17 DIAGNOSIS — J392 Other diseases of pharynx: Secondary | ICD-10-CM | POA: Diagnosis not present

## 2021-08-17 DIAGNOSIS — I129 Hypertensive chronic kidney disease with stage 1 through stage 4 chronic kidney disease, or unspecified chronic kidney disease: Secondary | ICD-10-CM | POA: Insufficient documentation

## 2021-08-17 DIAGNOSIS — I700 Atherosclerosis of aorta: Secondary | ICD-10-CM | POA: Diagnosis not present

## 2021-08-17 DIAGNOSIS — Z7984 Long term (current) use of oral hypoglycemic drugs: Secondary | ICD-10-CM | POA: Insufficient documentation

## 2021-08-17 DIAGNOSIS — M4802 Spinal stenosis, cervical region: Secondary | ICD-10-CM | POA: Insufficient documentation

## 2021-08-17 DIAGNOSIS — Z79899 Other long term (current) drug therapy: Secondary | ICD-10-CM | POA: Diagnosis not present

## 2021-08-17 DIAGNOSIS — I771 Stricture of artery: Secondary | ICD-10-CM | POA: Diagnosis not present

## 2021-08-17 DIAGNOSIS — M5021 Other cervical disc displacement,  high cervical region: Secondary | ICD-10-CM | POA: Diagnosis not present

## 2021-08-17 DIAGNOSIS — I6521 Occlusion and stenosis of right carotid artery: Secondary | ICD-10-CM

## 2021-08-17 DIAGNOSIS — M542 Cervicalgia: Secondary | ICD-10-CM

## 2021-08-17 DIAGNOSIS — N189 Chronic kidney disease, unspecified: Secondary | ICD-10-CM | POA: Diagnosis not present

## 2021-08-17 DIAGNOSIS — R519 Headache, unspecified: Secondary | ICD-10-CM | POA: Diagnosis not present

## 2021-08-17 DIAGNOSIS — E119 Type 2 diabetes mellitus without complications: Secondary | ICD-10-CM | POA: Insufficient documentation

## 2021-08-17 DIAGNOSIS — J432 Centrilobular emphysema: Secondary | ICD-10-CM | POA: Diagnosis not present

## 2021-08-17 LAB — CBC WITH DIFFERENTIAL/PLATELET
Abs Immature Granulocytes: 0.02 10*3/uL (ref 0.00–0.07)
Basophils Absolute: 0 10*3/uL (ref 0.0–0.1)
Basophils Relative: 1 %
Eosinophils Absolute: 0.3 10*3/uL (ref 0.0–0.5)
Eosinophils Relative: 4 %
HCT: 36.8 % — ABNORMAL LOW (ref 39.0–52.0)
Hemoglobin: 11.9 g/dL — ABNORMAL LOW (ref 13.0–17.0)
Immature Granulocytes: 0 %
Lymphocytes Relative: 21 %
Lymphs Abs: 1.8 10*3/uL (ref 0.7–4.0)
MCH: 27.2 pg (ref 26.0–34.0)
MCHC: 32.3 g/dL (ref 30.0–36.0)
MCV: 84.2 fL (ref 80.0–100.0)
Monocytes Absolute: 1.3 10*3/uL — ABNORMAL HIGH (ref 0.1–1.0)
Monocytes Relative: 15 %
Neutro Abs: 5.2 10*3/uL (ref 1.7–7.7)
Neutrophils Relative %: 59 %
Platelets: 244 10*3/uL (ref 150–400)
RBC: 4.37 MIL/uL (ref 4.22–5.81)
RDW: 14.6 % (ref 11.5–15.5)
WBC: 8.7 10*3/uL (ref 4.0–10.5)
nRBC: 0 % (ref 0.0–0.2)

## 2021-08-17 LAB — BASIC METABOLIC PANEL
Anion gap: 7 (ref 5–15)
BUN: 23 mg/dL (ref 8–23)
CO2: 22 mmol/L (ref 22–32)
Calcium: 8.8 mg/dL — ABNORMAL LOW (ref 8.9–10.3)
Chloride: 109 mmol/L (ref 98–111)
Creatinine, Ser: 1.05 mg/dL (ref 0.61–1.24)
GFR, Estimated: >60 mL/min (ref >60)
Glucose, Bld: 154 mg/dL — ABNORMAL HIGH (ref 70–99)
Potassium: 3.4 mmol/L — ABNORMAL LOW (ref 3.5–5.1)
Sodium: 138 mmol/L (ref 135–145)

## 2021-08-17 LAB — TROPONIN I (HIGH SENSITIVITY): Troponin I (High Sensitivity): 6 ng/L (ref <18)

## 2021-08-17 MED ORDER — ONDANSETRON HCL 4 MG/2ML IJ SOLN
4.0000 mg | Freq: Once | INTRAMUSCULAR | Status: AC
  Administered 2021-08-17: 4 mg via INTRAVENOUS
  Filled 2021-08-17: qty 2

## 2021-08-17 MED ORDER — OXYCODONE-ACETAMINOPHEN 5-325 MG PO TABS: 1.0000 | ORAL_TABLET | ORAL | 0 refills | Status: DC | PRN

## 2021-08-17 MED ORDER — FENTANYL CITRATE PF 50 MCG/ML IJ SOSY
50.0000 ug | PREFILLED_SYRINGE | Freq: Once | INTRAMUSCULAR | Status: AC
  Administered 2021-08-17: 50 ug via INTRAVENOUS
  Filled 2021-08-17: qty 1

## 2021-08-17 MED ORDER — SODIUM CHLORIDE 0.9 % IV BOLUS (SEPSIS)
500.0000 mL | Freq: Once | INTRAVENOUS | Status: AC
  Administered 2021-08-17: 500 mL via INTRAVENOUS

## 2021-08-17 MED ORDER — ONDANSETRON 4 MG PO TBDP: 4.0000 mg | ORAL_TABLET | Freq: Four times a day (QID) | ORAL | 0 refills | Status: DC | PRN

## 2021-08-17 MED ORDER — KETOROLAC TROMETHAMINE 30 MG/ML IJ SOLN
15.0000 mg | Freq: Once | INTRAMUSCULAR | Status: AC
  Administered 2021-08-17: 15 mg via INTRAVENOUS
  Filled 2021-08-17: qty 1

## 2021-08-17 MED ORDER — HYDROMORPHONE HCL 1 MG/ML IJ SOLN
0.5000 mg | Freq: Once | INTRAMUSCULAR | Status: AC
  Administered 2021-08-17: 0.5 mg via INTRAVENOUS
  Filled 2021-08-17: qty 0.5

## 2021-08-17 MED ORDER — IOHEXOL 350 MG/ML SOLN
50.0000 mL | Freq: Once | INTRAVENOUS | Status: AC | PRN
  Administered 2021-08-17: 50 mL via INTRAVENOUS

## 2021-08-17 MED ORDER — LIDOCAINE 5 % EX PTCH
1.0000 | MEDICATED_PATCH | CUTANEOUS | Status: DC
  Administered 2021-08-17: 1 via TRANSDERMAL
  Filled 2021-08-17: qty 1

## 2021-08-17 MED ORDER — IOHEXOL 350 MG/ML SOLN
75.0000 mL | Freq: Once | INTRAVENOUS | Status: AC | PRN
  Administered 2021-08-17: 75 mL via INTRAVENOUS

## 2021-08-17 MED ORDER — DOCUSATE SODIUM 100 MG PO CAPS: 100.0000 mg | ORAL_CAPSULE | Freq: Two times a day (BID) | ORAL | 0 refills | Status: DC

## 2021-08-17 NOTE — ED Provider Notes (Signed)
[facility] Provider Note    Event Date/Time   First MD Initiated Contact with Patient 08/17/21 [phone]     (approximate)   History   Torticollis   HPI  Luke Rojas is a 76 y.o. male with history of CAD status post stents on Plavix, hypertension, diabetes, hyperlipidemia, chronic kidney disease who presents to the emergency department with posterior neck pain that started yesterday and has progressively worsened.  Denies any known injury.  No numbness, tingling, weakness, bowel or bladder incontinence, urinary retention, fever.  No chest pain, abdominal pain.  No previous neck surgeries.   History provided by patient and wife.   Patient's cell - [phone] Wife's cell - [phone]   Past Medical History:  Diagnosis Date   Arthritis    OSTEOARTHRITIS   Chronic kidney disease    kidney stones   COPD (chronic obstructive pulmonary disease) (HCC)    Coronary artery disease    2 stents    Diabetes mellitus without complication (HCC)    GERD (gastroesophageal reflux disease)    Hypercholesteremia    Hypertension    Myocardial infarction [facility]) 4128   Renal colic     Past Surgical History:  Procedure Laterality Date   CARDIAC CATHETERIZATION  2013   2 stents   CATARACT EXTRACTION, BILATERAL     COLONOSCOPY  04/27/05   COLONOSCOPY WITH PROPOFOL N/A 06/08/2015   Procedure: COLONOSCOPY WITH PROPOFOL;  Surgeon: Robert Bellow, MD;  Location: [facility] ENDOSCOPY;  Service: Endoscopy;  Laterality: N/A;   CYSTOSCOPY WITH INSERTION OF UROLIFT N/A 02/11/2020   Procedure: CYSTOSCOPY WITH INSERTION OF UROLIFT;  Surgeon: Royston Cowper, MD;  Location: [facility] ORS;  Service: Urology;  Laterality: N/A;   CYSTOSCOPY WITH STENT PLACEMENT Right 09/16/2016   Procedure: CYSTOSCOPY WITH STENT PLACEMENT;  Surgeon: Royston Cowper, MD;  Location: [facility] ORS;  Service: Urology;  Laterality: Right;   EXTRACORPOREAL SHOCK WAVE LITHOTRIPSY Left 11/04/2014   has had 2  previous lithotripsies   EXTRACORPOREAL SHOCK WAVE LITHOTRIPSY Left 03/24/2015   Procedure: EXTRACORPOREAL SHOCK WAVE LITHOTRIPSY (ESWL);  Surgeon: Royston Cowper, MD;  Location: [facility] ORS;  Service: Urology;  Laterality: Left;   EXTRACORPOREAL SHOCK WAVE LITHOTRIPSY Right 10/04/2016   Procedure: EXTRACORPOREAL SHOCK WAVE LITHOTRIPSY (ESWL);  Surgeon: Royston Cowper, MD;  Location: [facility] ORS;  Service: Urology;  Laterality: Right;   LEFT HEART CATH AND CORONARY ANGIOGRAPHY N/A 12/05/2018   Procedure: LEFT HEART CATH AND CORONARY ANGIOGRAPHY with possible pci and stent;  Surgeon: Yolonda Kida, MD;  Location: [facility];  Service: Cardiovascular;  Laterality: N/A;    MEDICATIONS:  Prior to Admission medications   Medication Sig Start Date End Date Taking? Authorizing Provider  amLODipine (NORVASC) 10 MG tablet Take 1 tablet (10 mg total) by mouth daily. 04/12/21 07/17/21  Gwyneth Sprout, FNP  atorvastatin (LIPITOR) 80 MG tablet TAKE 1 TABLET BY MOUTH EVERY NIGHT AT BEDTIME 12/21/20   Tally Joe T, FNP  clopidogrel (PLAVIX) 75 MG tablet Take 75 mg by mouth daily.    [provider]  co-enzyme Q-10 30 MG capsule Take 30 mg by mouth daily.     [provider]  diclofenac sodium (VOLTAREN) 1 % GEL Apply 2 g topically 4 (four) times daily. 07/08/17   Chrismon, Vickki Muff, PA-C  gabapentin (NEURONTIN) 300 MG capsule Take 1 capsule (300 mg total) by mouth 3 (three) times daily. 07/21/21   Bacigalupo, Dionne Bucy, MD  Astrid Drafts  1000 MG CAPS Take 1,000 mg by mouth daily.     [provider]  metFORMIN (GLUCOPHAGE XR) 750 MG 24 hr tablet Take 1 tablet (750 mg total) by mouth daily with breakfast. 04/10/21   Gwyneth Sprout, FNP  Meth-Hyo-M Barnett Hatter Phos-Ph Sal (URIBEL) 118 MG CAPS Take 1 capsule (118 mg total) by mouth every 6 (six) hours as needed (dysuria). 02/11/20   Royston Cowper, MD  metoprolol tartrate (LOPRESSOR) 25 MG tablet Take 1 tablet (25 mg total) by mouth 2 (two)  times daily. 11/25/18   Gladstone Lighter, MD  MULTIPLE VITAMIN PO Take 1 tablet by mouth daily.    [provider]  nitroGLYCERIN (NITROSTAT) 0.4 MG SL tablet Place 1 tablet (0.4 mg total) under the tongue every 5 (five) minutes as needed for chest pain. 11/25/18   Gladstone Lighter, MD  Omega-3 Fatty Acids (FISH OIL) 1200 MG CAPS Take 1 capsule by mouth daily.    [provider]  OneTouch Delica Lancets 57Q MISC TEST FASTING GLUCOSE LEVEL EACH MORNING BEFORE BREAKFAST 04/29/20   Jerrol Banana., MD  pantoprazole (PROTONIX) 40 MG tablet Take 40 mg by mouth daily. 01/21/19   [provider]  ramipril (ALTACE) 10 MG capsule Take 1 capsule (10 mg total) by mouth daily. 11/25/18   Gladstone Lighter, MD  tamsulosin (FLOMAX) 0.4 MG CAPS capsule Take 0.4 mg by mouth daily. 02/12/19   [provider]    Physical Exam   Triage Vital Signs: ED Triage Vitals  Enc Vitals Group     BP --      Pulse Rate 08/17/21 0354 72     Resp 08/17/21 0354 18     Temp 08/17/21 0354 98 F (36.7 C)     Temp Source 08/17/21 0354 Oral     SpO2 08/17/21 0354 97 %     Weight 08/17/21 0353 185 lb (83.9 kg)     Height 08/17/21 0353 5\' 9"  (1.753 m)     Head Circumference --      Peak Flow --      Pain Score 08/17/21 0353 10     Pain Loc --      Pain Edu? --      Excl. in [place]? --     Most recent vital signs: Vitals:   08/17/21 0456 08/17/21 0650  BP: (!) 148/69 (!) 141/62  Pulse: 66 65  Resp: 16 16  Temp:    SpO2: 99% 96%    CONSTITUTIONAL: Alert and oriented and responds appropriately to questions.  Appears uncomfortable, afebrile, elderly HEAD: Normocephalic, atraumatic EYES: Conjunctivae clear, pupils appear equal, sclera nonicteric ENT: normal nose; moist mucous membranes NECK: Supple, normal ROM, tender to palpation diffusely throughout the posterior neck without step-off or deformity, difficulty turning the head to the right or left due to pain CARD: RRR; S1  and S2 appreciated; no murmurs, no clicks, no rubs, no gallops RESP: Normal chest excursion without splinting or tachypnea; breath sounds clear and equal bilaterally; no wheezes, no rhonchi, no rales, no hypoxia or respiratory distress, speaking full sentences ABD/GI: Normal bowel sounds; non-distended; soft, non-tender, no rebound, no guarding, no peritoneal signs BACK: The back appears normal EXT: Normal ROM in all joints; no deformity noted, no edema; no cyanosis SKIN: Normal color for age and race; warm; no rash on exposed skin NEURO: Moves all extremities equally, normal speech, normal sensation diffusely, no saddle anesthesia, no hyperreflexia, no facial asymmetry PSYCH: The patient's mood and manner  are appropriate.   ED Results / Procedures / Treatments   LABS: (all labs ordered are listed, but only abnormal results are displayed) Labs Reviewed  CBC WITH DIFFERENTIAL/PLATELET - Abnormal; Notable for the following components:      Result Value   Hemoglobin 11.9 (*)    HCT 36.8 (*)    Monocytes Absolute 1.3 (*)    All other components within normal limits  BASIC METABOLIC PANEL - Abnormal; Notable for the following components:   Potassium 3.4 (*)    Glucose, Bld 154 (*)    Calcium 8.8 (*)    All other components within normal limits  TROPONIN I (HIGH SENSITIVITY)     EKG:    Date: 08/17/2021 6:48 AM  Rate: 65  Rhythm: normal sinus rhythm  QRS Axis: normal  Intervals: normal  ST/T Wave abnormalities: normal  Conduction Disutrbances: none  Narrative Interpretation: unremarkable     RADIOLOGY: My personal review and interpretation of imaging: MRI of the cervical spine shows spinal stenosis without changes in the cervical cord.  Also possible metastasis to the T3 vertebral body.  CT of the chest shows no PE but does show a large right upper lobe mass.  CTA of the neck shows critical stenosis of the right carotid artery.  I have personally reviewed all radiology  reports.   MR Cervical Spine Wo Contrast  Result Date: 08/17/2021 CLINICAL DATA:  76 year old male with new onset neck stiffness and pain with decreased range of motion. No known injury. Advanced cervical spine degeneration and evidence of spinal stenosis on cervical spine CT today. EXAM: MRI CERVICAL SPINE WITHOUT CONTRAST TECHNIQUE: Multiplanar, multisequence MR imaging of the cervical spine was performed. No intravenous contrast was administered. COMPARISON:  Cervical spine CT, CTA neck, CTA chest today reported separately. FINDINGS: Alignment: Stable to the earlier CT, degenerative appearing spondylolisthesis at C3-C4 and C4-C5. Vertebrae: Normal background bone marrow signal. Degenerative endplate marrow signal changes at C5 and C6. However, T3 vertebral body patchy anterior decreased T1 signal and STIR hyperintense lesion which demonstrates patchy sclerosis on CT today. This is indeterminate for bone metastasis in light of the right lung findings. No other marrow edema or evidence of acute osseous abnormality. Cord: No spinal cord signal abnormality despite multilevel degenerative cord mass effect detailed below. Negative visible upper thoracic spinal canal and cord. Posterior Fossa, vertebral arteries, paraspinal tissues: Cervicomedullary junction is within normal limits. Preserved major vascular flow voids in the neck, the left vertebral artery appears dominant. Negative visible neck soft tissues. Chest reported separately today. Disc levels: C2-C3: Moderate facet hypertrophy on the right. Minor disc bulging. Foraminal endplate spurring mostly on the right. Moderate to severe right C3 foraminal stenosis. C3-C4: Moderate degenerative spinal stenosis and spinal cord mass effect (series 9, image 9 and series 12, image 11). Anterolisthesis with bulky broad-based posterior disc, also facet and ligament flavum hypertrophy. Severe bilateral C4 foraminal stenosis. C4-C5: Mild to moderate multifactorial spinal  stenosis and spinal cord mass effect (series 9, image 9 and series 12, image 14). Anterolisthesis with bulky posterior disc and also facet and ligament flavum hypertrophy. Severe bilateral C5 foraminal stenosis. C5-C6: Severe disc space loss. Circumferential disc osteophyte complex eccentric to the right. Mild to moderate facet hypertrophy. Mild spinal stenosis and ventral cord mass effect. Severe bilateral C6 foraminal stenosis. C6-C7: Similar danced disc space loss with bulky circumferential disc osteophyte complex. Broad-based posterior component. Borderline to mild spinal stenosis. No cord mass effect. Moderate to severe bilateral C7 foraminal stenosis. C7-T1:  Moderate facet hypertrophy but no stenosis. IMPRESSION: 1. Nonspecific 10 mm T3 vertebral body lesion is indeterminate for bone metastasis in light of the right lung findings today. Recommend attention on PET-CT. No evidence for cervical spine metastasis on this noncontrast exam. 2. 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 Mild spinal stenosis at C5-C6 and C6-C7. But no associated spinal cord signal abnormality. 3. Associated severe neural foraminal stenosis at the right C3, bilateral C4, C5, C6 and C7 nerve levels. Electronically Signed   By: Genevie Ann M.D.   On: 08/17/2021 06:46   CT Angio Chest PE W and/or Wo Contrast  Result Date: 08/17/2021 CLINICAL DATA:  76 year old male with suspected pulmonary embolism. EXAM: CT ANGIOGRAPHY CHEST WITH CONTRAST TECHNIQUE: Multidetector CT imaging of the chest was performed using the standard protocol during bolus administration of intravenous contrast. Multiplanar CT image reconstructions and MIPs were obtained to evaluate the vascular anatomy. RADIATION DOSE REDUCTION: This exam was performed according to the departmental dose-optimization program which includes automated exposure control, adjustment of the mA and/or kV according to  patient size and/or use of iterative reconstruction technique. CONTRAST:  16mL OMNIPAQUE IOHEXOL 350 MG/ML SOLN COMPARISON:  No priors. FINDINGS: Cardiovascular: There are no filling defects within the pulmonary arterial tree to suggest pulmonary embolism. Heart size is normal. There is no significant pericardial fluid, thickening or pericardial calcification. There is aortic atherosclerosis, as well as atherosclerosis of the great vessels of the mediastinum and the coronary arteries, including calcified atherosclerotic plaque in the left main, left anterior descending, left circumflex and right coronary arteries. Coronary artery stents are noted in the left anterior descending, left circumflex and right coronary arteries. Mediastinum/Nodes: Multiple prominent borderline enlarged right hilar lymph nodes measuring up to 1.2 cm in short axis. No other definite mediastinal or left hilar lymphadenopathy noted. Esophagus is unremarkable in appearance. No axillary lymphadenopathy. Lungs/Pleura: In the periphery of the inferior aspect of the right upper lobe (axial image 46 of series 5 and coronal image 74 of series 7) there is a 4.5 x 4.9 x 3.7 cm macrolobulated mass with slightly spiculated margins, which makes contact with the overlying pleura both laterally and inferiorly (the minor fissure). There is contour irregularity associated with the minor fissure, with a suggestion of early trans fissural invasion into the right middle lobe which is not definite at this time, but highly suspicious. No other definite suspicious appearing pulmonary nodules or masses are noted. No acute consolidative airspace disease. No pleural effusions. Mild-to-moderate centrilobular and paraseptal emphysema. Mild diffuse bronchial wall thickening. Upper Abdomen: Atherosclerotic calcifications in the thoracic aorta. Incompletely imaged exophytic low-attenuation lesion in the upper pole of the right kidney measuring at least 6.6 cm in diameter,  incompletely characterized on today's examination, but with potential enhancement along the superolateral margin of the lesion (best appreciated on axial image 130 of series 4). Musculoskeletal: There are no aggressive appearing lytic or blastic lesions noted in the visualized portions of the skeleton. Review of the MIP images confirms the above findings. IMPRESSION: 1. No evidence of pulmonary embolism. 2. 4.5 x 4.9 x 3.7 cm aggressive appearing right upper lobe mass with probable pleural involvement, potential early invasion into the right middle lobe and borderline enlarged but suspicious right hilar lymph nodes which likely represent early metastatic disease. Further evaluation with PET-CT is recommended in the near future. 3. Aortic atherosclerosis, in addition to left main and three-vessel coronary artery disease. Assessment for  potential risk factor modification, dietary therapy or pharmacologic therapy may be warranted, if clinically indicated. 4. Mild diffuse bronchial wall thickening with mild to moderate centrilobular and paraseptal emphysema; imaging findings suggestive of underlying COPD. Aortic Atherosclerosis (ICD10-I70.0) and Emphysema (ICD10-J43.9). Electronically Signed   By: Vinnie Langton M.D.   On: 08/17/2021 06:10   CT Angio Neck W and/or Wo Contrast  Result Date: 08/17/2021 CLINICAL DATA:  76 year old male with new onset neck stiffness and pain with decreased range of motion. No known injury. EXAM: CT ANGIOGRAPHY NECK TECHNIQUE: Multidetector CT imaging of the neck was performed using the standard protocol during bolus administration of intravenous contrast. Multiplanar CT image reconstructions and MIPs were obtained to evaluate the vascular anatomy. Carotid stenosis measurements (when applicable) are obtained utilizing NASCET criteria, using the distal internal carotid diameter as the denominator. RADIATION DOSE REDUCTION: This exam was performed according to the departmental  dose-optimization program which includes automated exposure control, adjustment of the mA and/or kV according to patient size and/or use of iterative reconstruction technique. CONTRAST:  38mL OMNIPAQUE IOHEXOL 350 MG/ML SOLN COMPARISON:  CT head and cervical spine today reported separately. FINDINGS: CTA NECK Skeleton: Cervical spine CT detailed separately. Absent dentition. No acute osseous abnormality identified. Upper chest: Large lobulated right upper lobe lung mass, series 2, image 106, up to 5.4 cm long axis and with some spiculated margins. The lesion extends to the lateral pleural surface. Underlying emphysema. No pleural effusion. Visible major airways remain patent. No visible mediastinal or hilar lymphadenopathy. Other neck: Small benign appearing retention cyst of the right lateral pharyngeal wall on series 2, image 54. No cervical lymphadenopathy or suspicious neck mass identified. Aortic arch: Calcified aortic atherosclerosis. Three vessel arch configuration. Right carotid system: Brachiocephalic artery plaque and tortuosity with no significant stenosis. Similar plaque in the proximal right CCA without stenosis. Bulky calcified plaque at the right ICA origin with high-grade stenosis approaching a radiographic string sign on series 4, image 134, series 6, image 110. The right ICA remains patent to the skull base. Left carotid system: Mild motion artifact. Intermittent plaque with no significant stenosis to the skull base. Vertebral arteries: Mild proximal right subclavian artery plaque without stenosis. Plaque at the right vertebral artery origin with suspected mild stenosis. Non dominant right vertebral artery otherwise patent to the skull base. Proximal left subclavian artery moderate soft and calcified plaque but no significant stenosis. Plaque adjacent to the right vertebral artery origin with no significant stenosis (series 5, image 116). Dominant left vertebral artery is tortuous but patent to the  skull base with no stenosis. CTA HEAD Posterior circulation: Dominant left V4 segment with multifocal calcified plaque. Moderate to severe stenosis results on series 4, image 75, but the left vertebrobasilar junction is patent. Left PICA origin distal to that stenosis also patent. Diminutive right V4 segment beyond PICA. Visible basilar artery, tip, SCA and PCA origins are patent. Anterior circulation: Both ICA siphons are patent with moderate bilateral siphon calcified plaque. There is moderate right siphon stenosis at the anterior genu. Mild left siphon stenosis. Visible carotid termini, MCA and ACA origins are patent. Anatomic variants: Dominant left vertebral artery. Review of the MIP images confirms the above findings IMPRESSION: 1. Large Right Upper Lobe Lung Mass, estimated 5.4 cm. 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 But there is no visible mediastinal or hilar lymphadenopathy. Emphysema (ICD10-J43.9). 2. Severe right carotid atherosclerosis with High-grade stenosis at the Right ICA origin approaching a RADIOGRAPHIC STRING SIGN. And downstream moderate right  ICA siphon stenosis. 3. Dominant Left Vertebral Artery with Moderate To Severe Left V4 segment stenosis. 4. No arterial dissection in the neck.  And mild stenosis otherwise. 5. Aortic Atherosclerosis (ICD10-I70.0). Head and Cervical spine CT reported separately. Electronically Signed   By: Genevie Ann M.D.   On: 08/17/2021 05:02   CT HEAD WO CONTRAST (5MM)  Result Date: 08/17/2021 CLINICAL DATA:  76 year old male with new onset neck stiffness and pain with decreased range of motion. No known injury. EXAM: CT HEAD WITHOUT CONTRAST TECHNIQUE: Contiguous axial images were obtained from the base of the skull through the vertex without intravenous contrast. RADIATION DOSE REDUCTION: This exam was performed according to the departmental dose-optimization program which includes automated exposure control, adjustment of the mA and/or  kV according to patient size and/or use of iterative reconstruction technique. COMPARISON:  CT cervical spine and CTA neck today reported separately. FINDINGS: Brain: There are few punctate calcifications scattered in the right hemisphere subarachnoid spaces (such as series 4, images 23 and 24). These could be postinflammatory or related to distal vessel atherosclerosis. No midline shift, ventriculomegaly, mass effect, evidence of mass lesion, intracranial hemorrhage or evidence of cortically based acute infarction. Gray-white matter differentiation is within normal limits for age and no cortical encephalomalacia is identified. Vascular: Extensive Calcified atherosclerosis at the skull base. No suspicious intracranial vascular hyperdensity. Skull: No acute osseous abnormality identified. Sinuses/Orbits: Moderately opacified bilateral ethmoid air cells, right frontoethmoidal recess, right maxillary sinus. No sinus fluid level, but there is mild bubbly opacity. Tympanic cavities and mastoids appear clear. Other: Postoperative changes to both globes, otherwise negative visible orbit and scalp soft tissues. IMPRESSION: 1. No acute intracranial abnormality. Largely normal for age non contrast CT appearance of the Brain; advanced calcified atherosclerosis which might explain occasional punctate calcifications in the right hemisphere, versus post infectious sequelae. 2. Moderate bilateral paranasal sinus inflammation. Electronically Signed   By: Genevie Ann M.D.   On: 08/17/2021 04:54   CT Cervical Spine Wo Contrast  Result Date: 08/17/2021 CLINICAL DATA:  76 year old male with new onset neck stiffness and pain with decreased range of motion. No known injury. EXAM: CT CERVICAL SPINE WITHOUT CONTRAST TECHNIQUE: Multidetector CT imaging of the cervical spine was performed without intravenous contrast. Multiplanar CT image reconstructions were also generated. RADIATION DOSE REDUCTION: This exam was performed according to the  departmental dose-optimization program which includes automated exposure control, adjustment of the mA and/or kV according to patient size and/or use of iterative reconstruction technique. COMPARISON:  Head CT and neck CTA reported separately. FINDINGS: Alignment: Degenerative appearing anterolisthesis of C3 on C4 (1-2 mm) and C4 on C5 (3 mm). Maintained bilateral posterior element alignment. Cervicothoracic junction alignment is within normal limits. Skull base and vertebrae: Bone mineralization is within normal limits for age. Visualized skull base is intact. No atlanto-occipital dissociation. C1 and C2 appear intact and aligned. No acute osseous abnormality identified. Soft tissues and spinal canal: No prevertebral fluid or swelling. No visible canal hematoma. Hypopharynx motion artifact, but the visible noncontrast neck soft tissues appear negative aside from calcified atherosclerosis. Neck CTA is reported separately. Disc levels: Moderately advanced anterior C1-C2 degeneration. Widespread cervical disc degeneration, severe with endplate spurring X9-K2 and C6-C7. Moderate to severe upper cervical facet arthropathy which is bilateral at C3-C4 and C4-C5. Multilevel degenerative cervical spinal stenosis suspected including C3-C4, C5-C6, C6-C7. Upper chest: Visible upper thoracic levels appear intact. There is some centrilobular and paraseptal emphysema greater on the right. Calcified aortic atherosclerosis. Otherwise negative visible noncontrast  superior mediastinum. Other: Head CT reported separately. IMPRESSION: 1. No acute osseous abnormality in the cervical spine. 2. Widespread advanced cervical spine degeneration, including degenerative appearing spondylolisthesis at C3-C4 and C4-C5. Multilevel spinal stenosis is suspected and would be best characterized by noncontrast Cervical Spine MRI. 3. Aortic Atherosclerosis (ICD10-I70.0) and Emphysema (ICD10-J43.9). Electronically Signed   By: Genevie Ann M.D.   On:  08/17/2021 04:51     PROCEDURES:  Critical Care performed: No      .1-3 Lead EKG Interpretation  Performed by: Kamarah Bilotta, Delice Bison, DO Authorized by: Ebonie Westerlund, Delice Bison, DO     Interpretation: normal     ECG rate:  65   ECG rate assessment: normal     Rhythm: sinus rhythm     Ectopy: none     Conduction: normal       IMPRESSION / MDM / ASSESSMENT AND PLAN / ED COURSE  I reviewed the triage vital signs and the nursing notes.    Patient here with severe neck pain that has progressively worsened over the past 24 hours.  Unable to turn the head due to pain.  No focal neurologic deficits.  The patient is on the cardiac monitor to evaluate for evidence of arrhythmia and/or significant heart rate changes.   DIFFERENTIAL DIAGNOSIS (includes but not limited to):   Torticollis, spinal stenosis, compression fracture, vertebral artery dissection, doubt cervical myelopathy, discitis or osteomyelitis, transverse myelitis, CVA   Patient's presentation is most consistent with acute presentation with potential threat to life or bodily function.   PLAN: We will obtain CBC, BMP, CT of the cervical spine and CTA of the neck.  Given some of the pain goes into the posterior head, will also obtain CT of the head.  Will give pain medication and reassess.   MEDICATIONS GIVEN IN ED: Medications  lidocaine (LIDODERM) 5 % 1 patch (1 patch Transdermal Patch Applied 08/17/21 0442)  sodium chloride 0.9 % bolus 500 mL (500 mLs Intravenous New Bag/Given 08/17/21 0643)  fentaNYL (SUBLIMAZE) injection 50 mcg (50 mcg Intravenous Given 08/17/21 0442)  ondansetron (ZOFRAN) injection 4 mg (4 mg Intravenous Given 08/17/21 0441)  iohexol (OMNIPAQUE) 350 MG/ML injection 75 mL (75 mLs Intravenous Contrast Given 08/17/21 0432)  HYDROmorphone (DILAUDID) injection 0.5 mg (0.5 mg Intravenous Given 08/17/21 0535)  ketorolac (TORADOL) 30 MG/ML injection 15 mg (15 mg Intravenous Given 08/17/21 0535)  iohexol (OMNIPAQUE) 350  MG/ML injection 50 mL (50 mLs Intravenous Contrast Given 08/17/21 0540)     ED COURSE: Patient denies any significant relief after fentanyl and Lidoderm patch.  Will give Dilaudid, Toradol.  CT head reviewed/interpreted by myself radiology and shows no acute abnormality.  CT cervical spine reviewed/interpreted myself radiology and concerning for spinal stenosis and degenerative changes.  Patient still having significant pain.  We will proceed with MRI of the cervical spine for further evaluation.   CTA of the neck reviewed/interpreted by myself and radiologist shows severe right carotid stenosis.  I do not think this is the cause of his pain.  No dissection.  He has no focal neurologic deficits today.  He is on Plavix.  Discussed the case with Dr. Lucky Cowboy on-call for vascular surgery who states this does not need anything done acutely and patient can follow-up as an outpatient for this.  Discussed this with patient and wife.   CT of the neck reveals a large lung mass in the right upper lobe.  No known history of cancer.  He now tells me that he has  felt short of breath for several weeks.  We will proceed with CTA of the chest to further evaluate this mass and also to rule out PE.  He will need oncology follow-up.   CTA chest reviewed/interpreted by myself and radiologist.  No pulmonary embolus.  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  Discussed with Dr. Janese Banks on-call for oncology.  Appreciate her help.  She will follow-up with this patient to set up outpatient PET scan and biopsy.   MRI of the cervical spine reviewed and interpreted by myself and radiology.  Patient has a nonspecific 10 mm T3 vertebral body lesion that is indeterminate for bone metastasis.  Patient will be getting a PET scan as outpatient.  No cervical spine metastasis or  fracture.  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  Mild stenosis at C5-C6 and C6-C7.  Patient remains neurologically intact.  Able to ambulate here.  Tolerating p.o.  Reports feeling much better and is able to move his head.  Request discharge home.  States he is supposed to go to [place] with his family for a week tomorrow.  I have given him outpatient follow-up information for neurosurgery, vascular surgery and oncology.  Will discharge with prescription of pain medication.  Discussed at length return precautions.  Daughter and wife at bedside are comfortable with this plan as well.   At this time, I do not feel there is any life-threatening condition present. I reviewed all nursing notes, vitals, pertinent previous records.  All lab and urine results, EKGs, imaging ordered have been independently reviewed and interpreted by myself.  I reviewed all available radiology reports from any imaging ordered this visit.  Based on my assessment, I feel the patient is safe to be discharged home without further emergent workup and can continue workup as an outpatient as needed. Discussed all findings, treatment plan as well as usual and customary return precautions with patient and family.  They verbalize understanding and are comfortable with this plan.  Outpatient follow-up has been provided as needed.  All questions have been answered.   CONSULTS: Discussed with Dr. Lucky Cowboy with vascular surgery.  Discussed with Dr. Janese Banks on-call for oncology.   OUTSIDE RECORDS REVIEWED: Reviewed patient's last office visit with cardiology on 07/27/2021.       FINAL CLINICAL IMPRESSION(S) / ED DIAGNOSES   Final diagnoses:  Neck pain  Mass of upper lobe of right lung  Carotid artery stenosis, asymptomatic, right  Cervical stenosis of spinal canal     Rx / DC Orders   ED Discharge Orders           Ordered    oxyCODONE-acetaminophen (PERCOCET) 5-325 MG tablet  Every 4 hours PRN        08/17/21 0656    ondansetron (ZOFRAN-ODT) 4 MG disintegrating tablet  Every 6 hours PRN        08/17/21 0656    docusate sodium (COLACE) 100 MG capsule  2 times daily        08/17/21 0656             Note:  This document was prepared using Dragon voice recognition software and may include unintentional dictation errors.   Lattie Cervi, Delice Bison, DO 08/17/21 [phone]

## 2021-08-17 NOTE — Discharge Instructions (Addendum)
You are being provided a prescription for opiates (also known as narcotics) for pain control.  Opiates can be addictive and should only be used when absolutely necessary for pain control when other alternatives do not work.  We recommend you only use them for the recommended amount of time and only as prescribed.  Please do not take with other sedative medications or alcohol.  Please do not drive, operate machinery, make important decisions while taking opiates.  Please note that these medications can be addictive and have high abuse potential.  Patients can become addicted to narcotics after only taking them for a few days.  Please keep these medications locked away from children, teenagers or any family members with history of substance abuse.  Narcotic pain medicine may also make you constipated.  You may use over-the-counter medications such as MiraLAX, Colace to prevent constipation.  If you become constipated, you may use over-the-counter enemas as needed.  Itching and nausea are also common side effects of narcotic pain medication.  If you develop uncontrolled vomiting or a rash, please stop these medications and seek medical care.    Your work-up today has shown degenerative changes of your spine on an mild to moderate cervical spine stenosis.  You do not have any signs of injury however to your spinal cord.  Please follow-up with Dr. Izora Ribas with neurosurgery.  If you develop numbness, weakness in your arms or legs, difficulty holding your bowel or bladder, difficulty emptying her bladder, unable to walk, please return to the emergency department.   Your CT scan also showed significant stenosis of the right carotid artery.  Please continue your Plavix as prescribed.  You will need to follow-up with vascular surgery, Dr. Lucky Cowboy for close outpatient management as you will likely need intervention, possible stent to this area.  If you develop neurologic symptoms such as numbness or weakness, confusion,  facial droop, changes in speech, vision changes, severe headache or sudden onset right-sided neck pain, please return to the emergency department.   Your CT scans also incidentally showed a large right upper lung mass.  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  Please follow-up with Dr. Janese Banks with oncology for close outpatient follow-up for biopsy and PET scan.

## 2021-08-17 NOTE — Progress Notes (Signed)
   08/17/21 0700  Clinical Encounter Type  Visited With Patient and family together  Visit Type Initial  Referral From Nurse  Consult/Referral To Chaplain   Chaplain responded to nurse consult. Chaplain provided compassionate presence and reflective listening as wife spoke about recent health news of her husband. Wife was sad and teary and expressed a deep faith and support through her church. Chaplain also spoke with daughter. Chaplain provided hospitality to patient. Patient and family appreciated Chaplain visit.

## 2021-08-17 NOTE — ED Triage Notes (Addendum)
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

## 2021-08-23 ENCOUNTER — Ambulatory Visit (HOSPITAL_COMMUNITY): Payer: Medicare Other

## 2021-08-28 ENCOUNTER — Inpatient Hospital Stay: Payer: Medicare Other | Attending: Oncology | Admitting: Oncology

## 2021-08-28 ENCOUNTER — Inpatient Hospital Stay: Payer: Medicare Other

## 2021-08-28 ENCOUNTER — Encounter: Payer: Self-pay | Admitting: *Deleted

## 2021-08-28 ENCOUNTER — Other Ambulatory Visit: Payer: Self-pay | Admitting: Oncology

## 2021-08-28 ENCOUNTER — Encounter: Payer: Self-pay | Admitting: Oncology

## 2021-08-28 VITALS — BP 107/62 | HR 67 | Temp 96.2°F | Wt 174.0 lb

## 2021-08-28 DIAGNOSIS — R918 Other nonspecific abnormal finding of lung field: Secondary | ICD-10-CM | POA: Diagnosis not present

## 2021-08-29 ENCOUNTER — Ambulatory Visit (INDEPENDENT_AMBULATORY_CARE_PROVIDER_SITE_OTHER): Payer: Medicare Other | Admitting: Vascular Surgery

## 2021-08-29 ENCOUNTER — Encounter
Admission: RE | Admit: 2021-08-29 | Discharge: 2021-08-29 | Disposition: A | Discharge status: Home / Self Care | Payer: Medicare Other | Source: Ambulatory Visit | Attending: Oncology | Admitting: Oncology

## 2021-08-29 ENCOUNTER — Other Ambulatory Visit: Payer: Medicare Other

## 2021-08-29 VITALS — BP 146/72 | HR 71 | Resp 16 | Wt 173.0 lb

## 2021-08-29 DIAGNOSIS — K5730 Diverticulosis of large intestine without perforation or abscess without bleeding: Secondary | ICD-10-CM | POA: Diagnosis not present

## 2021-08-29 DIAGNOSIS — E1159 Type 2 diabetes mellitus with other circulatory complications: Secondary | ICD-10-CM

## 2021-08-29 DIAGNOSIS — R918 Other nonspecific abnormal finding of lung field: Secondary | ICD-10-CM | POA: Insufficient documentation

## 2021-08-29 DIAGNOSIS — J432 Centrilobular emphysema: Secondary | ICD-10-CM | POA: Diagnosis not present

## 2021-08-29 DIAGNOSIS — E785 Hyperlipidemia, unspecified: Secondary | ICD-10-CM

## 2021-08-29 DIAGNOSIS — I6521 Occlusion and stenosis of right carotid artery: Secondary | ICD-10-CM | POA: Diagnosis not present

## 2021-08-29 DIAGNOSIS — I152 Hypertension secondary to endocrine disorders: Secondary | ICD-10-CM

## 2021-08-29 DIAGNOSIS — N281 Cyst of kidney, acquired: Secondary | ICD-10-CM | POA: Diagnosis not present

## 2021-08-29 DIAGNOSIS — E1169 Type 2 diabetes mellitus with other specified complication: Secondary | ICD-10-CM

## 2021-08-29 DIAGNOSIS — I6529 Occlusion and stenosis of unspecified carotid artery: Secondary | ICD-10-CM | POA: Insufficient documentation

## 2021-08-29 LAB — GLUCOSE, CAPILLARY: Glucose-Capillary: 105 mg/dL — ABNORMAL HIGH (ref 70–99)

## 2021-08-29 MED ORDER — FLUDEOXYGLUCOSE F - 18 (FDG) INJECTION
9.6000 | Freq: Once | INTRAVENOUS | Status: AC | PRN
  Administered 2021-08-29: 10.46 via INTRAVENOUS

## 2021-08-29 NOTE — H&P (View-Only) (Signed)
Patient ID: Luke Rojas, male   DOB: 08/04/1945, 76 y.o.   MRN: [id number]  Chief Complaint  Patient presents with   Follow-up    [facility] ED follow up    HPI Luke Rojas is a 76 y.o. male.  I am asked to see the patient by Dr. Leonides Schanz in the [facility] ED for evaluation of a high grade right carotid artery stenosis.  The patient recently went to the emergency department for a variety of issues and had a series of scans which revealed multiple significant problems.  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  He saw oncology for that yesterday.  He also had some significant spinal issues and is scheduled to see neurosurgery and have an MRI on Friday.  He has a long history of coronary interventions with multiple coronary stents.  He underwent a CT angiogram of the neck which I have independently reviewed.  This is interpreted as a string sign right carotid artery stenosis and it does appear quite high-grade.  The left carotid artery had very mild stenosis.  Given the string sign finding, he is referred for further evaluation and treatment.  He is on Plavix and a statin agent already.     Past Medical History:  Diagnosis Date   Arthritis    OSTEOARTHRITIS   Chronic kidney disease    kidney stones   COPD (chronic obstructive pulmonary disease) (HCC)    Coronary artery disease    2 stents    Diabetes mellitus without complication (HCC)    GERD (gastroesophageal reflux disease)    Hypercholesteremia    Hypertension    Myocardial infarction [facility]) 5035   Renal colic     Past Surgical History:  Procedure Laterality Date   CARDIAC CATHETERIZATION  2013   2 stents   CATARACT EXTRACTION, BILATERAL     COLONOSCOPY  04/27/05   COLONOSCOPY WITH PROPOFOL N/A 06/08/2015   Procedure: COLONOSCOPY WITH PROPOFOL;  Surgeon: Robert Bellow, MD;  Location: [facility] ENDOSCOPY;  Service: Endoscopy;  Laterality: N/A;   CYSTOSCOPY WITH INSERTION OF UROLIFT N/A 02/11/2020    Procedure: CYSTOSCOPY WITH INSERTION OF UROLIFT;  Surgeon: Royston Cowper, MD;  Location: [facility] ORS;  Service: Urology;  Laterality: N/A;   CYSTOSCOPY WITH STENT PLACEMENT Right 09/16/2016   Procedure: CYSTOSCOPY WITH STENT PLACEMENT;  Surgeon: Royston Cowper, MD;  Location: [facility] ORS;  Service: Urology;  Laterality: Right;   EXTRACORPOREAL SHOCK WAVE LITHOTRIPSY Left 11/04/2014   has had 2 previous lithotripsies   EXTRACORPOREAL SHOCK WAVE LITHOTRIPSY Left 03/24/2015   Procedure: EXTRACORPOREAL SHOCK WAVE LITHOTRIPSY (ESWL);  Surgeon: Royston Cowper, MD;  Location: [facility] ORS;  Service: Urology;  Laterality: Left;   EXTRACORPOREAL SHOCK WAVE LITHOTRIPSY Right 10/04/2016   Procedure: EXTRACORPOREAL SHOCK WAVE LITHOTRIPSY (ESWL);  Surgeon: Royston Cowper, MD;  Location: [facility] ORS;  Service: Urology;  Laterality: Right;   LEFT HEART CATH AND CORONARY ANGIOGRAPHY N/A 12/05/2018   Procedure: LEFT HEART CATH AND CORONARY ANGIOGRAPHY with possible pci and stent;  Surgeon: Yolonda Kida, MD;  Location: [place] [facility];  Service: Cardiovascular;  Laterality: N/A;     Family History  Problem Relation Age of Onset   Pulmonary embolism Mother    Transient ischemic attack Mother    Diabetes Mother    Pancreatic cancer Father    Hypertension Father    Diabetes Father    Cirrhosis Brother  Social History   Tobacco Use   Smoking status: Former    Packs/day: 1.00    Years: 30.00    Total pack years: 30.00    Types: Cigarettes    Quit date: 03/05/1984    Years since quitting: 37.5   Smokeless tobacco: Current    Types: Chew  Vaping Use   Vaping Use: Never used  Substance Use Topics   Alcohol use: No    Alcohol/week: 0.0 standard drinks of alcohol   Drug use: No     Allergies  Allergen Reactions   Codeine Nausea Only, Nausea And Vomiting and Other (See Comments)    Other reaction(s): Vomiting    Current Outpatient Medications  Medication Sig Dispense Refill   amLODipine  (NORVASC) 10 MG tablet Take 1 tablet (10 mg total) by mouth daily. 90 tablet 1   atorvastatin (LIPITOR) 80 MG tablet TAKE 1 TABLET BY MOUTH EVERY NIGHT AT BEDTIME 90 tablet 3   clopidogrel (PLAVIX) 75 MG tablet Take 75 mg by mouth daily.     co-enzyme Q-10 30 MG capsule Take 30 mg by mouth daily.      diclofenac sodium (VOLTAREN) 1 % GEL Apply 2 g topically 4 (four) times daily. 100 g 3   docusate sodium (COLACE) 100 MG capsule Take 1 capsule (100 mg total) by mouth 2 (two) times daily. 60 capsule 0   Krill Oil 1000 MG CAPS Take 1,000 mg by mouth daily.      metFORMIN (GLUCOPHAGE XR) 750 MG 24 hr tablet Take 1 tablet (750 mg total) by mouth daily with breakfast. 90 tablet 1   Meth-Hyo-M Bl-Na Phos-Ph Sal (URIBEL) 118 MG CAPS Take 1 capsule (118 mg total) by mouth every 6 (six) hours as needed (dysuria). 40 capsule 3   metoprolol tartrate (LOPRESSOR) 25 MG tablet Take 1 tablet (25 mg total) by mouth 2 (two) times daily. 60 tablet 2   MULTIPLE VITAMIN PO Take 1 tablet by mouth daily.     nitroGLYCERIN (NITROSTAT) 0.4 MG SL tablet Place 1 tablet (0.4 mg total) under the tongue every 5 (five) minutes as needed for chest pain. 30 tablet 1   OneTouch Delica Lancets 56Y MISC TEST FASTING GLUCOSE LEVEL EACH MORNING BEFORE BREAKFAST 100 each 4   pantoprazole (PROTONIX) 40 MG tablet Take 40 mg by mouth daily.     ramipril (ALTACE) 10 MG capsule Take 1 capsule (10 mg total) by mouth daily. 30 capsule 2   tamsulosin (FLOMAX) 0.4 MG CAPS capsule Take 0.4 mg by mouth daily.     gabapentin (NEURONTIN) 300 MG capsule Take 1 capsule (300 mg total) by mouth 3 (three) times daily. (Patient not taking: Reported on 08/28/2021) 90 capsule 1   ondansetron (ZOFRAN-ODT) 4 MG disintegrating tablet Take 1 tablet (4 mg total) by mouth every 6 (six) hours as needed for nausea or vomiting. (Patient not taking: Reported on 08/28/2021) 20 tablet 0   No current facility-administered medications for this visit.      REVIEW OF  SYSTEMS (Negative unless checked)  Constitutional: [] Weight loss  [] Fever  [] Chills Cardiac: [] Chest pain   [] Chest pressure   [] Palpitations   [] Shortness of breath when laying flat   [] Shortness of breath at rest   [x] Shortness of breath with exertion. Vascular:  [] Pain in legs with walking   [] Pain in legs at rest   [] Pain in legs when laying flat   [] Claudication   [] Pain in feet when walking  [] Pain in feet at rest  []   Pain in feet when laying flat   [] History of DVT   [] Phlebitis   [] Swelling in legs   [] Varicose veins   [] Non-healing ulcers Pulmonary:   [] Uses home oxygen   [x] Productive cough   [] Hemoptysis   [] Wheeze  [x] COPD   [] Asthma Neurologic:  [] Dizziness  [] Blackouts   [] Seizures   [] History of stroke   [] History of TIA  [] Aphasia   [] Temporary blindness   [] Dysphagia   [] Weakness or numbness in arms   [] Weakness or numbness in legs Musculoskeletal:  [x] Arthritis   [] Joint swelling   [x] Joint pain   [] Low back pain Hematologic:  [] Easy bruising  [] Easy bleeding   [] Hypercoagulable state   [] Anemic  [] Hepatitis Gastrointestinal:  [] Blood in stool   [] Vomiting blood  [] Gastroesophageal reflux/heartburn   [] Abdominal pain Genitourinary:  [] Chronic kidney disease   [] Difficult urination  [] Frequent urination  [] Burning with urination   [] Hematuria Skin:  [] Rashes   [] Ulcers   [] Wounds Psychological:  [] History of anxiety   []  History of major depression.    Physical Exam BP (!) 146/72 (BP Location: Left Arm)   Pulse 71   Resp 16   Wt 173 lb (78.5 kg)   BMI 25.55 kg/m  Gen:  WD/WN, NAD.  Appears younger than stated age Head: [place]/AT, No temporalis wasting.  Ear/Nose/Throat: Hearing grossly intact, nares w/o erythema or drainage, oropharynx w/o Erythema/Exudate Eyes: Conjunctiva clear, sclera non-icteric  Neck: trachea midline.  No JVD.  Pulmonary:  Good air movement, respirations not labored, no use of accessory muscles  Cardiac: RRR, no JVD Vascular:  Vessel Right Left   Radial Palpable Palpable                                   Gastrointestinal:. No masses, surgical incisions, or scars. Musculoskeletal: M/S 5/5 throughout.  Extremities without ischemic changes.  No deformity or atrophy.  No edema. Neurologic: Sensation grossly intact in extremities.  Symmetrical.  Speech is fluent. Motor exam as listed above. Psychiatric: Judgment intact, Mood & affect appropriate for pt's clinical situation. Dermatologic: No rashes or ulcers noted.  No cellulitis or open wounds.    Radiology MR Cervical Spine Wo Contrast  Result Date: 08/17/2021 CLINICAL DATA:  76 year old male with new onset neck stiffness and pain with decreased range of motion. No known injury. Advanced cervical spine degeneration and evidence of spinal stenosis on cervical spine CT today. EXAM: MRI CERVICAL SPINE WITHOUT CONTRAST TECHNIQUE: Multiplanar, multisequence MR imaging of the cervical spine was performed. No intravenous contrast was administered. COMPARISON:  Cervical spine CT, CTA neck, CTA chest today reported separately. FINDINGS: Alignment: Stable to the earlier CT, degenerative appearing spondylolisthesis at C3-C4 and C4-C5. Vertebrae: Normal background bone marrow signal. Degenerative endplate marrow signal changes at C5 and C6. However, T3 vertebral body patchy anterior decreased T1 signal and STIR hyperintense lesion which demonstrates patchy sclerosis on CT today. This is indeterminate for bone metastasis in light of the right lung findings. No other marrow edema or evidence of acute osseous abnormality. Cord: No spinal cord signal abnormality despite multilevel degenerative cord mass effect detailed below. Negative visible upper thoracic spinal canal and cord. Posterior Fossa, vertebral arteries, paraspinal tissues: Cervicomedullary junction is within normal limits. Preserved major vascular flow voids in the neck, the left vertebral artery appears dominant. Negative visible neck soft  tissues. Chest reported separately today. Disc levels: C2-C3: Moderate facet hypertrophy on the right. Minor disc bulging.  Foraminal endplate spurring mostly on the right. Moderate to severe right C3 foraminal stenosis. C3-C4: Moderate degenerative spinal stenosis and spinal cord mass effect (series 9, image 9 and series 12, image 11). Anterolisthesis with bulky broad-based posterior disc, also facet and ligament flavum hypertrophy. Severe bilateral C4 foraminal stenosis. C4-C5: Mild to moderate multifactorial spinal stenosis and spinal cord mass effect (series 9, image 9 and series 12, image 14). Anterolisthesis with bulky posterior disc and also facet and ligament flavum hypertrophy. Severe bilateral C5 foraminal stenosis. C5-C6: Severe disc space loss. Circumferential disc osteophyte complex eccentric to the right. Mild to moderate facet hypertrophy. Mild spinal stenosis and ventral cord mass effect. Severe bilateral C6 foraminal stenosis. C6-C7: Similar danced disc space loss with bulky circumferential disc osteophyte complex. Broad-based posterior component. Borderline to mild spinal stenosis. No cord mass effect. Moderate to severe bilateral C7 foraminal stenosis. C7-T1:  Moderate facet hypertrophy but no stenosis. IMPRESSION: 1. Nonspecific 10 mm T3 vertebral body lesion is indeterminate for bone metastasis in light of the right lung findings today. Recommend attention on PET-CT. No evidence for cervical spine metastasis on this noncontrast exam. 2. 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 Mild spinal stenosis at C5-C6 and C6-C7. But no associated spinal cord signal abnormality. 3. Associated severe neural foraminal stenosis at the right C3, bilateral C4, C5, C6 and C7 nerve levels. Electronically Signed   By: Genevie Ann M.D.   On: 08/17/2021 06:46   CT Angio Chest PE W and/or Wo Contrast  Result Date: 08/17/2021 CLINICAL DATA:   76 year old male with suspected pulmonary embolism. EXAM: CT ANGIOGRAPHY CHEST WITH CONTRAST TECHNIQUE: Multidetector CT imaging of the chest was performed using the standard protocol during bolus administration of intravenous contrast. Multiplanar CT image reconstructions and MIPs were obtained to evaluate the vascular anatomy. RADIATION DOSE REDUCTION: This exam was performed according to the departmental dose-optimization program which includes automated exposure control, adjustment of the mA and/or kV according to patient size and/or use of iterative reconstruction technique. CONTRAST:  64mL OMNIPAQUE IOHEXOL 350 MG/ML SOLN COMPARISON:  No priors. FINDINGS: Cardiovascular: There are no filling defects within the pulmonary arterial tree to suggest pulmonary embolism. Heart size is normal. There is no significant pericardial fluid, thickening or pericardial calcification. There is aortic atherosclerosis, as well as atherosclerosis of the great vessels of the mediastinum and the coronary arteries, including calcified atherosclerotic plaque in the left main, left anterior descending, left circumflex and right coronary arteries. Coronary artery stents are noted in the left anterior descending, left circumflex and right coronary arteries. Mediastinum/Nodes: Multiple prominent borderline enlarged right hilar lymph nodes measuring up to 1.2 cm in short axis. No other definite mediastinal or left hilar lymphadenopathy noted. Esophagus is unremarkable in appearance. No axillary lymphadenopathy. Lungs/Pleura: In the periphery of the inferior aspect of the right upper lobe (axial image 46 of series 5 and coronal image 74 of series 7) there is a 4.5 x 4.9 x 3.7 cm macrolobulated mass with slightly spiculated margins, which makes contact with the overlying pleura both laterally and inferiorly (the minor fissure). There is contour irregularity associated with the minor fissure, with a suggestion of early trans fissural  invasion into the right middle lobe which is not definite at this time, but highly suspicious. No other definite suspicious appearing pulmonary nodules or masses are noted. No acute consolidative airspace disease. No pleural effusions. Mild-to-moderate centrilobular and paraseptal emphysema. Mild diffuse bronchial wall thickening.  Upper Abdomen: Atherosclerotic calcifications in the thoracic aorta. Incompletely imaged exophytic low-attenuation lesion in the upper pole of the right kidney measuring at least 6.6 cm in diameter, incompletely characterized on today's examination, but with potential enhancement along the superolateral margin of the lesion (best appreciated on axial image 130 of series 4). Musculoskeletal: There are no aggressive appearing lytic or blastic lesions noted in the visualized portions of the skeleton. Review of the MIP images confirms the above findings. IMPRESSION: 1. No evidence of pulmonary embolism. 2. 4.5 x 4.9 x 3.7 cm aggressive appearing right upper lobe mass with probable pleural involvement, potential early invasion into the right middle lobe and borderline enlarged but suspicious right hilar lymph nodes which likely represent early metastatic disease. Further evaluation with PET-CT is recommended in the near future. 3. Aortic atherosclerosis, in addition to left main and three-vessel coronary artery disease. Assessment for potential risk factor modification, dietary therapy or pharmacologic therapy may be warranted, if clinically indicated. 4. Mild diffuse bronchial wall thickening with mild to moderate centrilobular and paraseptal emphysema; imaging findings suggestive of underlying COPD. Aortic Atherosclerosis (ICD10-I70.0) and Emphysema (ICD10-J43.9). Electronically Signed   By: Vinnie Langton M.D.   On: 08/17/2021 06:10   CT Angio Neck W and/or Wo Contrast  Result Date: 08/17/2021 CLINICAL DATA:  76 year old male with new onset neck stiffness and pain with decreased range  of motion. No known injury. EXAM: CT ANGIOGRAPHY NECK TECHNIQUE: Multidetector CT imaging of the neck was performed using the standard protocol during bolus administration of intravenous contrast. Multiplanar CT image reconstructions and MIPs were obtained to evaluate the vascular anatomy. Carotid stenosis measurements (when applicable) are obtained utilizing NASCET criteria, using the distal internal carotid diameter as the denominator. RADIATION DOSE REDUCTION: This exam was performed according to the departmental dose-optimization program which includes automated exposure control, adjustment of the mA and/or kV according to patient size and/or use of iterative reconstruction technique. CONTRAST:  62mL OMNIPAQUE IOHEXOL 350 MG/ML SOLN COMPARISON:  CT head and cervical spine today reported separately. FINDINGS: CTA NECK Skeleton: Cervical spine CT detailed separately. Absent dentition. No acute osseous abnormality identified. Upper chest: Large lobulated right upper lobe lung mass, series 2, image 106, up to 5.4 cm long axis and with some spiculated margins. The lesion extends to the lateral pleural surface. Underlying emphysema. No pleural effusion. Visible major airways remain patent. No visible mediastinal or hilar lymphadenopathy. Other neck: Small benign appearing retention cyst of the right lateral pharyngeal wall on series 2, image 54. No cervical lymphadenopathy or suspicious neck mass identified. Aortic arch: Calcified aortic atherosclerosis. Three vessel arch configuration. Right carotid system: Brachiocephalic artery plaque and tortuosity with no significant stenosis. Similar plaque in the proximal right CCA without stenosis. Bulky calcified plaque at the right ICA origin with high-grade stenosis approaching a radiographic string sign on series 4, image 134, series 6, image 110. The right ICA remains patent to the skull base. Left carotid system: Mild motion artifact. Intermittent plaque with no  significant stenosis to the skull base. Vertebral arteries: Mild proximal right subclavian artery plaque without stenosis. Plaque at the right vertebral artery origin with suspected mild stenosis. Non dominant right vertebral artery otherwise patent to the skull base. Proximal left subclavian artery moderate soft and calcified plaque but no significant stenosis. Plaque adjacent to the right vertebral artery origin with no significant stenosis (series 5, image 116). Dominant left vertebral artery is tortuous but patent to the skull base with no stenosis. CTA HEAD Posterior circulation: Dominant  left V4 segment with multifocal calcified plaque. Moderate to severe stenosis results on series 4, image 75, but the left vertebrobasilar junction is patent. Left PICA origin distal to that stenosis also patent. Diminutive right V4 segment beyond PICA. Visible basilar artery, tip, SCA and PCA origins are patent. Anterior circulation: Both ICA siphons are patent with moderate bilateral siphon calcified plaque. There is moderate right siphon stenosis at the anterior genu. Mild left siphon stenosis. Visible carotid termini, MCA and ACA origins are patent. Anatomic variants: Dominant left vertebral artery. Review of the MIP images confirms the above findings IMPRESSION: 1. Large Right Upper Lobe Lung Mass, estimated 5.4 cm. 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 But there is no visible mediastinal or hilar lymphadenopathy. Emphysema (ICD10-J43.9). 2. Severe right carotid atherosclerosis with High-grade stenosis at the Right ICA origin approaching a RADIOGRAPHIC STRING SIGN. And downstream moderate right ICA siphon stenosis. 3. Dominant Left Vertebral Artery with Moderate To Severe Left V4 segment stenosis. 4. No arterial dissection in the neck.  And mild stenosis otherwise. 5. Aortic Atherosclerosis (ICD10-I70.0). Head and Cervical spine CT reported separately. Electronically Signed   By: Genevie Ann M.D.    On: 08/17/2021 05:02   CT HEAD WO CONTRAST (5MM)  Result Date: 08/17/2021 CLINICAL DATA:  76 year old male with new onset neck stiffness and pain with decreased range of motion. No known injury. EXAM: CT HEAD WITHOUT CONTRAST TECHNIQUE: Contiguous axial images were obtained from the base of the skull through the vertex without intravenous contrast. RADIATION DOSE REDUCTION: This exam was performed according to the departmental dose-optimization program which includes automated exposure control, adjustment of the mA and/or kV according to patient size and/or use of iterative reconstruction technique. COMPARISON:  CT cervical spine and CTA neck today reported separately. FINDINGS: Brain: There are few punctate calcifications scattered in the right hemisphere subarachnoid spaces (such as series 4, images 23 and 24). These could be postinflammatory or related to distal vessel atherosclerosis. No midline shift, ventriculomegaly, mass effect, evidence of mass lesion, intracranial hemorrhage or evidence of cortically based acute infarction. Gray-white matter differentiation is within normal limits for age and no cortical encephalomalacia is identified. Vascular: Extensive Calcified atherosclerosis at the skull base. No suspicious intracranial vascular hyperdensity. Skull: No acute osseous abnormality identified. Sinuses/Orbits: Moderately opacified bilateral ethmoid air cells, right frontoethmoidal recess, right maxillary sinus. No sinus fluid level, but there is mild bubbly opacity. Tympanic cavities and mastoids appear clear. Other: Postoperative changes to both globes, otherwise negative visible orbit and scalp soft tissues. IMPRESSION: 1. No acute intracranial abnormality. Largely normal for age non contrast CT appearance of the Brain; advanced calcified atherosclerosis which might explain occasional punctate calcifications in the right hemisphere, versus post infectious sequelae. 2. Moderate bilateral paranasal  sinus inflammation. Electronically Signed   By: Genevie Ann M.D.   On: 08/17/2021 04:54   CT Cervical Spine Wo Contrast  Result Date: 08/17/2021 CLINICAL DATA:  76 year old male with new onset neck stiffness and pain with decreased range of motion. No known injury. EXAM: CT CERVICAL SPINE WITHOUT CONTRAST TECHNIQUE: Multidetector CT imaging of the cervical spine was performed without intravenous contrast. Multiplanar CT image reconstructions were also generated. RADIATION DOSE REDUCTION: This exam was performed according to the departmental dose-optimization program which includes automated exposure control, adjustment of the mA and/or kV according to patient size and/or use of iterative reconstruction technique. COMPARISON:  Head CT and neck CTA reported separately. FINDINGS: Alignment: Degenerative appearing anterolisthesis of C3 on C4 (1-2 mm) and  C4 on C5 (3 mm). Maintained bilateral posterior element alignment. Cervicothoracic junction alignment is within normal limits. Skull base and vertebrae: Bone mineralization is within normal limits for age. Visualized skull base is intact. No atlanto-occipital dissociation. C1 and C2 appear intact and aligned. No acute osseous abnormality identified. Soft tissues and spinal canal: No prevertebral fluid or swelling. No visible canal hematoma. Hypopharynx motion artifact, but the visible noncontrast neck soft tissues appear negative aside from calcified atherosclerosis. Neck CTA is reported separately. Disc levels: Moderately advanced anterior C1-C2 degeneration. Widespread cervical disc degeneration, severe with endplate spurring M3-N3 and C6-C7. Moderate to severe upper cervical facet arthropathy which is bilateral at C3-C4 and C4-C5. Multilevel degenerative cervical spinal stenosis suspected including C3-C4, C5-C6, C6-C7. Upper chest: Visible upper thoracic levels appear intact. There is some centrilobular and paraseptal emphysema greater on the right. Calcified aortic  atherosclerosis. Otherwise negative visible noncontrast superior mediastinum. Other: Head CT reported separately. IMPRESSION: 1. No acute osseous abnormality in the cervical spine. 2. Widespread advanced cervical spine degeneration, including degenerative appearing spondylolisthesis at C3-C4 and C4-C5. Multilevel spinal stenosis is suspected and would be best characterized by noncontrast Cervical Spine MRI. 3. Aortic Atherosclerosis (ICD10-I70.0) and Emphysema (ICD10-J43.9). Electronically Signed   By: Genevie Ann M.D.   On: 08/17/2021 04:51    Labs Recent Results (from the past 2160 hour(s))  I-STAT creatinine     Status: Abnormal   Collection Time: 06/19/21  9:29 AM  Result Value Ref Range   Creatinine, Ser 1.50 (H) 0.61 - 1.24 mg/dL  Urine Microalbumin w/creat. ratio     Status: None   Collection Time: 07/17/21 12:00 AM  Result Value Ref Range   Creatinine, Urine 186.1 Not Estab. mg/dL   Microalbumin, Urine 13.8 Not Estab. ug/mL   Microalb/Creat Ratio 7 0 - 29 mg/g creat    Comment:                        Normal:                0 -  29                        Moderately increased: 30 - 300                        Severely increased:       >300   Specimen status report     Status: None   Collection Time: 07/17/21 12:00 AM  Result Value Ref Range   specimen status report Comment     Comment: Please note Please note The date and/or time of collection was not indicated on the requisition as required by state and federal law.  The date of receipt of the specimen was used as the collection date if not supplied.   POCT glycosylated hemoglobin (Hb A1C)     Status: None   Collection Time: 07/17/21  8:18 AM  Result Value Ref Range   Hemoglobin A1C 5.6 4.0 - 5.6 %   Est. average glucose Bld gHb Est-mCnc 114   CBC with Differential/Platelet     Status: Abnormal   Collection Time: 08/17/21  4:51 AM  Result Value Ref Range   WBC 8.7 4.0 - 10.5 K/uL   RBC 4.37 4.22 - 5.81 MIL/uL   Hemoglobin  11.9 (L) 13.0 - 17.0 g/dL   HCT 36.8 (L) 39.0 - 52.0 %   MCV 84.2  80.0 - 100.0 fL   MCH 27.2 26.0 - 34.0 pg   MCHC 32.3 30.0 - 36.0 g/dL   RDW 14.6 11.5 - 15.5 %   Platelets 244 150 - 400 K/uL   nRBC 0.0 0.0 - 0.2 %   Neutrophils Relative % 59 %   Neutro Abs 5.2 1.7 - 7.7 K/uL   Lymphocytes Relative 21 %   Lymphs Abs 1.8 0.7 - 4.0 K/uL   Monocytes Relative 15 %   Monocytes Absolute 1.3 (H) 0.1 - 1.0 K/uL   Eosinophils Relative 4 %   Eosinophils Absolute 0.3 0.0 - 0.5 K/uL   Basophils Relative 1 %   Basophils Absolute 0.0 0.0 - 0.1 K/uL   Immature Granulocytes 0 %   Abs Immature Granulocytes 0.02 0.00 - 0.07 K/uL    Comment: Performed at [facility], [address], [place], [place] [address]  Basic metabolic panel     Status: Abnormal   Collection Time: 08/17/21  4:51 AM  Result Value Ref Range   Sodium 138 135 - 145 mmol/L   Potassium 3.4 (L) 3.5 - 5.1 mmol/L   Chloride 109 98 - 111 mmol/L   CO2 22 22 - 32 mmol/L   Glucose, Bld 154 (H) 70 - 99 mg/dL    Comment: Glucose reference range applies only to samples taken after fasting for at least 8 hours.   BUN 23 8 - 23 mg/dL   Creatinine, Ser 1.05 0.61 - 1.24 mg/dL   Calcium 8.8 (L) 8.9 - 10.3 mg/dL   GFR, Estimated >60 >60 mL/min    Comment: (NOTE) Calculated using the CKD-EPI Creatinine Equation (2021)    Anion gap 7 5 - 15    Comment: Performed at [facility], [place], [place] [address]  Troponin I (High Sensitivity)     Status: None   Collection Time: 08/17/21  4:51 AM  Result Value Ref Range   Troponin I (High Sensitivity) 6 <18 ng/L    Comment: (NOTE) Elevated high sensitivity troponin I (hsTnI) values and significant  changes across serial measurements may suggest ACS but many other  chronic and acute conditions are known to elevate hsTnI results.  Refer to the "Links" section for chest pain algorithms and additional  guidance. Performed at [facility] [address], [place] [place], [place] [address]   Glucose, capillary     Status: Abnormal   Collection Time: 08/29/21  9:12 AM  Result Value Ref Range   Glucose-Capillary 105 (H) 70 - 99 mg/dL    Comment: Glucose reference range applies only to samples taken after fasting for at least 8 hours.    Assessment/Plan:  Hypertension associated with diabetes ([place]) blood pressure control important in reducing the progression of atherosclerotic disease. On appropriate oral medications.   T2DM (type 2 diabetes mellitus) ([facility]) blood glucose control important in reducing the progression of atherosclerotic disease. Also, involved in wound healing. On appropriate medications.   Hyperlipidemia associated with type 2 diabetes mellitus ([facility]) lipid control important in reducing the progression of atherosclerotic disease. 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  Carotid stenosis He underwent a CT angiogram of the neck which I have independently reviewed.  This is interpreted as a string sign right carotid artery stenosis and it does appear quite high-grade.  The left carotid artery had very mild stenosis.  This is a critical lesion and clearly requires treatment for stroke risk  reduction.  He has multiple upcoming other issues and holding his Plavix for any period of time before revascularization would be a high risk situation for stroke.  He is already on Plavix and we are going to try to expedite his carotid treatment and plan a carotid stent on the right later this week.  I have discussed the risks and benefits of the procedure.  I discussed the reason and rationale for treatment.  Patient voices his understanding and is agreeable to proceed with right carotid stent placement later this week.      Leotis Pain 08/29/2021, 4:02 PM   This note was created with Dragon medical transcription system.  Any errors from dictation are unintentional.

## 2021-08-29 NOTE — Assessment & Plan Note (Signed)
blood pressure control important in reducing the progression of atherosclerotic disease. On appropriate oral medications.  

## 2021-08-30 ENCOUNTER — Encounter: Payer: Self-pay | Admitting: Oncology

## 2021-08-30 ENCOUNTER — Telehealth: Payer: Self-pay | Admitting: *Deleted

## 2021-08-30 DIAGNOSIS — I10 Essential (primary) hypertension: Secondary | ICD-10-CM | POA: Diagnosis not present

## 2021-08-30 DIAGNOSIS — E782 Mixed hyperlipidemia: Secondary | ICD-10-CM | POA: Diagnosis not present

## 2021-08-30 DIAGNOSIS — E119 Type 2 diabetes mellitus without complications: Secondary | ICD-10-CM | POA: Diagnosis not present

## 2021-08-30 DIAGNOSIS — Z955 Presence of coronary angioplasty implant and graft: Secondary | ICD-10-CM | POA: Diagnosis not present

## 2021-08-30 DIAGNOSIS — M5432 Sciatica, left side: Secondary | ICD-10-CM | POA: Diagnosis not present

## 2021-08-30 DIAGNOSIS — I6523 Occlusion and stenosis of bilateral carotid arteries: Secondary | ICD-10-CM | POA: Diagnosis not present

## 2021-08-30 DIAGNOSIS — J449 Chronic obstructive pulmonary disease, unspecified: Secondary | ICD-10-CM | POA: Diagnosis not present

## 2021-08-30 DIAGNOSIS — M5431 Sciatica, right side: Secondary | ICD-10-CM | POA: Diagnosis not present

## 2021-08-30 DIAGNOSIS — I2510 Atherosclerotic heart disease of native coronary artery without angina pectoris: Secondary | ICD-10-CM | POA: Diagnosis not present

## 2021-08-30 NOTE — Telephone Encounter (Signed)
Entered in error

## 2021-08-31 ENCOUNTER — Telehealth (INDEPENDENT_AMBULATORY_CARE_PROVIDER_SITE_OTHER): Payer: Self-pay

## 2021-08-31 NOTE — Telephone Encounter (Signed)
I contacted the patient and let him know that once I have the prior authorization I will call to get him scheduled.

## 2021-09-01 ENCOUNTER — Ambulatory Visit
Admission: RE | Admit: 2021-09-01 | Discharge: 2021-09-01 | Disposition: A | Discharge status: Home / Self Care | Payer: Medicare Other | Source: Ambulatory Visit | Attending: Oncology | Admitting: Oncology

## 2021-09-01 ENCOUNTER — Encounter: Payer: Self-pay | Admitting: *Deleted

## 2021-09-01 DIAGNOSIS — R918 Other nonspecific abnormal finding of lung field: Secondary | ICD-10-CM | POA: Diagnosis not present

## 2021-09-01 DIAGNOSIS — J3489 Other specified disorders of nose and nasal sinuses: Secondary | ICD-10-CM | POA: Diagnosis not present

## 2021-09-01 MED ORDER — GADOBUTROL 1 MMOL/ML IV SOLN
7.5000 mL | Freq: Once | INTRAVENOUS | Status: AC | PRN
  Administered 2021-09-01: 7.5 mL via INTRAVENOUS

## 2021-09-01 NOTE — Progress Notes (Signed)
Spoke extensively with wife Zigmund Daniel re: preprocedural instructions for CT Biopsy of Lung 09/08/2021. Reviewed meds for 10 min. With wife: Pt. Last dose of plavix to be 09/03/21 for procedure7/09/2021. Pt. To hold Metformin AM of 09/03/21. Pt. To take BP meds AM of procedure. Pt.'s wife wrote down instructions on paper (per wife) & read back instructions to RN. Reviewed that pt. Needs to be NPO after MN, come to medical mall for registration, time confirmed to come at 09:30 AM for 10:30 AM procedure. Wife and daughter to escort pt. For procedure. Wife Zigmund Daniel verbalized understanding of conversation. Wife given phone # to call if any questions arise before 09/08/2021.

## 2021-09-01 NOTE — Telephone Encounter (Addendum)
Spoke with the patient and he is scheduled for 09/11/21 with Dr. Lucky Cowboy for a right carotid stent placement with a 6:45 am arrival time to the MM. Pre-procedure instructions were discussed and will be mailed. Patient was initially to be scheduled for 09/01/21 with a 2:00 pm arrival time to the MM. I was told that the patient would need to be rescheduled to 09/11/21 for his procedure.

## 2021-09-01 NOTE — Progress Notes (Signed)
Phone call made to patient to review upcoming appts for biopsy on 7/7 and follow up on 7/17 with Dr. Baruch Gouty and Dr. Janese Banks to discuss results and treatment options. Informed to expect call this afternoon from special procedures RN to further review instructions prior to biopsy and when to stop plavix prior to biopsy. Instructed pt's wife to call back with any questions or needs. Pt's wife verbalized understanding.   Pt's wife wanted BP reading to be noted:  6/27 - 142/82 6/28- 151/81 6/28 - 122/74 6/29 - 173/73 (pt states was anxious this day) 6/30 - 152/81 Instructed pt's wife to continue to monitor BP at this since it appears his BP has improved since last visit on 6/26. She verbalized understanding.

## 2021-09-02 DIAGNOSIS — C3411 Malignant neoplasm of upper lobe, right bronchus or lung: Secondary | ICD-10-CM

## 2021-09-02 HISTORY — DX: Malignant neoplasm of upper lobe, right bronchus or lung: C34.11

## 2021-09-07 ENCOUNTER — Other Ambulatory Visit: Payer: Self-pay | Admitting: Student

## 2021-09-07 DIAGNOSIS — R918 Other nonspecific abnormal finding of lung field: Secondary | ICD-10-CM

## 2021-09-08 ENCOUNTER — Other Ambulatory Visit: Payer: Self-pay

## 2021-09-08 ENCOUNTER — Ambulatory Visit
Admission: RE | Admit: 2021-09-08 | Discharge: 2021-09-08 | Disposition: A | Discharge status: Home / Self Care | Payer: Medicare Other | Source: Ambulatory Visit | Attending: Interventional Radiology | Admitting: Interventional Radiology

## 2021-09-08 ENCOUNTER — Ambulatory Visit
Admission: RE | Admit: 2021-09-08 | Discharge: 2021-09-08 | Disposition: A | Discharge status: Home / Self Care | Payer: Medicare Other | Source: Ambulatory Visit | Attending: Oncology | Admitting: Oncology

## 2021-09-08 DIAGNOSIS — Z800 Family history of malignant neoplasm of digestive organs: Secondary | ICD-10-CM | POA: Diagnosis not present

## 2021-09-08 DIAGNOSIS — Z955 Presence of coronary angioplasty implant and graft: Secondary | ICD-10-CM | POA: Insufficient documentation

## 2021-09-08 DIAGNOSIS — Z79899 Other long term (current) drug therapy: Secondary | ICD-10-CM | POA: Diagnosis not present

## 2021-09-08 DIAGNOSIS — E785 Hyperlipidemia, unspecified: Secondary | ICD-10-CM | POA: Insufficient documentation

## 2021-09-08 DIAGNOSIS — E7800 Pure hypercholesterolemia, unspecified: Secondary | ICD-10-CM | POA: Diagnosis not present

## 2021-09-08 DIAGNOSIS — I6521 Occlusion and stenosis of right carotid artery: Secondary | ICD-10-CM | POA: Insufficient documentation

## 2021-09-08 DIAGNOSIS — Z833 Family history of diabetes mellitus: Secondary | ICD-10-CM | POA: Diagnosis not present

## 2021-09-08 DIAGNOSIS — I2510 Atherosclerotic heart disease of native coronary artery without angina pectoris: Secondary | ICD-10-CM | POA: Insufficient documentation

## 2021-09-08 DIAGNOSIS — R918 Other nonspecific abnormal finding of lung field: Secondary | ICD-10-CM | POA: Insufficient documentation

## 2021-09-08 DIAGNOSIS — Z7984 Long term (current) use of oral hypoglycemic drugs: Secondary | ICD-10-CM | POA: Diagnosis not present

## 2021-09-08 DIAGNOSIS — J439 Emphysema, unspecified: Secondary | ICD-10-CM | POA: Insufficient documentation

## 2021-09-08 DIAGNOSIS — Z7902 Long term (current) use of antithrombotics/antiplatelets: Secondary | ICD-10-CM | POA: Diagnosis not present

## 2021-09-08 DIAGNOSIS — I252 Old myocardial infarction: Secondary | ICD-10-CM | POA: Insufficient documentation

## 2021-09-08 DIAGNOSIS — Z8249 Family history of ischemic heart disease and other diseases of the circulatory system: Secondary | ICD-10-CM | POA: Diagnosis not present

## 2021-09-08 DIAGNOSIS — F17220 Nicotine dependence, chewing tobacco, uncomplicated: Secondary | ICD-10-CM | POA: Diagnosis not present

## 2021-09-08 DIAGNOSIS — Z885 Allergy status to narcotic agent status: Secondary | ICD-10-CM | POA: Diagnosis not present

## 2021-09-08 DIAGNOSIS — I700 Atherosclerosis of aorta: Secondary | ICD-10-CM | POA: Insufficient documentation

## 2021-09-08 DIAGNOSIS — I152 Hypertension secondary to endocrine disorders: Secondary | ICD-10-CM | POA: Diagnosis not present

## 2021-09-08 DIAGNOSIS — I10 Essential (primary) hypertension: Secondary | ICD-10-CM | POA: Insufficient documentation

## 2021-09-08 DIAGNOSIS — I6502 Occlusion and stenosis of left vertebral artery: Secondary | ICD-10-CM | POA: Insufficient documentation

## 2021-09-08 DIAGNOSIS — C3411 Malignant neoplasm of upper lobe, right bronchus or lung: Secondary | ICD-10-CM | POA: Insufficient documentation

## 2021-09-08 DIAGNOSIS — E1169 Type 2 diabetes mellitus with other specified complication: Secondary | ICD-10-CM | POA: Diagnosis not present

## 2021-09-08 DIAGNOSIS — K219 Gastro-esophageal reflux disease without esophagitis: Secondary | ICD-10-CM | POA: Insufficient documentation

## 2021-09-08 LAB — CBC
HCT: 39.2 % (ref 39.0–52.0)
Hemoglobin: 12.7 g/dL — ABNORMAL LOW (ref 13.0–17.0)
MCH: 26.8 pg (ref 26.0–34.0)
MCHC: 32.4 g/dL (ref 30.0–36.0)
MCV: 82.7 fL (ref 80.0–100.0)
Platelets: 271 10*3/uL (ref 150–400)
RBC: 4.74 MIL/uL (ref 4.22–5.81)
RDW: 15 % (ref 11.5–15.5)
WBC: 9.2 10*3/uL (ref 4.0–10.5)
nRBC: 0 % (ref 0.0–0.2)

## 2021-09-08 LAB — PROTIME-INR
INR: 1.1 (ref 0.8–1.2)
Prothrombin Time: 14.2 seconds (ref 11.4–15.2)

## 2021-09-08 LAB — GLUCOSE, CAPILLARY: Glucose-Capillary: 90 mg/dL (ref 70–99)

## 2021-09-08 MED ORDER — MIDAZOLAM HCL 2 MG/2ML IJ SOLN
INTRAMUSCULAR | Status: AC
  Filled 2021-09-08: qty 2

## 2021-09-08 MED ORDER — SODIUM CHLORIDE 0.9 % IV SOLN: INTRAVENOUS | Status: DC

## 2021-09-08 MED ORDER — MIDAZOLAM HCL 2 MG/2ML IJ SOLN
INTRAMUSCULAR | Status: AC | PRN
  Administered 2021-09-08: 1 mg via INTRAVENOUS

## 2021-09-08 MED ORDER — MIDAZOLAM HCL 5 MG/5ML IJ SOLN
INTRAMUSCULAR | Status: AC | PRN
  Administered 2021-09-08 (×2): .5 mg via INTRAVENOUS

## 2021-09-08 MED ORDER — FENTANYL CITRATE (PF) 100 MCG/2ML IJ SOLN
INTRAMUSCULAR | Status: AC
  Filled 2021-09-08: qty 2

## 2021-09-08 MED ORDER — FENTANYL CITRATE (PF) 100 MCG/2ML IJ SOLN
INTRAMUSCULAR | Status: AC | PRN
  Administered 2021-09-08: 25 ug via INTRAVENOUS
  Administered 2021-09-08: 50 ug via INTRAVENOUS

## 2021-09-08 NOTE — Procedures (Signed)
Interventional Radiology Procedure Note  Procedure: CT guided biopsy of RUL mass.  Complications: None Recommendations: - Bedrest until CXR cleared.  Minimize talking, coughing or otherwise straining.  - Follow up 1 hr CXR pending  - NPO until CXR cleared  Signed,  Corrie Mckusick, DO

## 2021-09-08 NOTE — H&P (Signed)
Chief Complaint: Patient was seen in consultation today for RUL lung mass biopsy.  Referring Physician(s): Sindy Guadeloupe  Supervising Physician: Corrie Mckusick  Patient Status: [facility] - Out-pt  History of Present Illness: Luke Rojas is a 76 y.o. male with a past medical history significant for GERD, HLD, HTN, MI, CAD s/p stenting, tobacco use, COPD who presents today for a RUL lung mass biopsy. [name] presented to [facility] ED on 08/17/21 with complaints of neck pain and decreased range of motion, he underwent CTA neck which showed:  1. Large Right Upper Lobe Lung Mass, estimated 5.4 cm. 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 But there is no visible mediastinal or hilar lymphadenopathy. Emphysema (ICD10-J43.9).   2. Severe right carotid atherosclerosis with High-grade stenosis at the Right ICA origin approaching a RADIOGRAPHIC STRING SIGN. And downstream moderate right ICA siphon stenosis.   3. Dominant Left Vertebral Artery with Moderate To Severe Left V4 segment stenosis.   4. No arterial dissection in the neck.  And mild stenosis otherwise  This was followed by a CTA chest which showed:  1. No evidence of pulmonary embolism. 2. 4.5 x 4.9 x 3.7 cm aggressive appearing right upper lobe mass with probable pleural involvement, potential early invasion into the right middle lobe and borderline enlarged but suspicious right hilar lymph nodes which likely represent early metastatic disease. Further evaluation with PET-CT is recommended in the near future. 3. Aortic atherosclerosis, in addition to left main and three-vessel coronary artery disease. Assessment for potential risk factor modification, dietary therapy or pharmacologic therapy may be warranted, if clinically indicated. 4. Mild diffuse bronchial wall thickening with mild to moderate centrilobular and paraseptal emphysema; imaging findings suggestive of underlying COPD.  He was  referred to medical oncology and underwent PET on 08/30/21 which showed hypermetabolic activity within the the RUL mass consistent with primary bronchogenic carcinoma as well as mild asymmetric FDG activity in the right suprahilar region, suspicious for right hilar lymph node metastasis.  He has been referred to IR for a lung biopsy to further direct care.   Past Medical History:  Diagnosis Date   Arthritis    OSTEOARTHRITIS   Chronic kidney disease    kidney stones   COPD (chronic obstructive pulmonary disease) (HCC)    Coronary artery disease    2 stents    Diabetes mellitus without complication (HCC)    GERD (gastroesophageal reflux disease)    Hypercholesteremia    Hypertension    Myocardial infarction [facility]) 2409   Renal colic     Past Surgical History:  Procedure Laterality Date   CARDIAC CATHETERIZATION  2013   2 stents   CATARACT EXTRACTION, BILATERAL     COLONOSCOPY  04/27/05   COLONOSCOPY WITH PROPOFOL N/A 06/08/2015   Procedure: COLONOSCOPY WITH PROPOFOL;  Surgeon: Robert Bellow, MD;  Location: [facility] ENDOSCOPY;  Service: Endoscopy;  Laterality: N/A;   CYSTOSCOPY WITH INSERTION OF UROLIFT N/A 02/11/2020   Procedure: CYSTOSCOPY WITH INSERTION OF UROLIFT;  Surgeon: Royston Cowper, MD;  Location: [facility] ORS;  Service: Urology;  Laterality: N/A;   CYSTOSCOPY WITH STENT PLACEMENT Right 09/16/2016   Procedure: CYSTOSCOPY WITH STENT PLACEMENT;  Surgeon: Royston Cowper, MD;  Location: [facility] ORS;  Service: Urology;  Laterality: Right;   EXTRACORPOREAL SHOCK WAVE LITHOTRIPSY Left 11/04/2014   has had 2 previous lithotripsies   EXTRACORPOREAL SHOCK WAVE LITHOTRIPSY Left 03/24/2015   Procedure: EXTRACORPOREAL SHOCK WAVE LITHOTRIPSY (ESWL);  Surgeon: Royston Cowper, MD;  Location:  [facility] ORS;  Service: Urology;  Laterality: Left;   EXTRACORPOREAL SHOCK WAVE LITHOTRIPSY Right 10/04/2016   Procedure: EXTRACORPOREAL SHOCK WAVE LITHOTRIPSY (ESWL);  Surgeon: Royston Cowper, MD;  Location:  [facility] ORS;  Service: Urology;  Laterality: Right;   LEFT HEART CATH AND CORONARY ANGIOGRAPHY N/A 12/05/2018   Procedure: LEFT HEART CATH AND CORONARY ANGIOGRAPHY with possible pci and stent;  Surgeon: Yolonda Kida, MD;  Location: [place] [facility];  Service: Cardiovascular;  Laterality: N/A;    Allergies: Codeine  Medications: Prior to Admission medications   Medication Sig Start Date End Date Taking? Authorizing Provider  amLODipine (NORVASC) 10 MG tablet Take 1 tablet (10 mg total) by mouth daily. 04/12/21 09/08/21 Yes Gwyneth Sprout, FNP  atorvastatin (LIPITOR) 80 MG tablet TAKE 1 TABLET BY MOUTH EVERY NIGHT AT BEDTIME 12/21/20  Yes Tally Joe T, FNP  clopidogrel (PLAVIX) 75 MG tablet Take 75 mg by mouth daily.    [provider]  co-enzyme Q-10 30 MG capsule Take 30 mg by mouth daily.     [provider]  diclofenac sodium (VOLTAREN) 1 % GEL Apply 2 g topically 4 (four) times daily. 07/08/17   Chrismon, Vickki Muff, PA-C  docusate sodium (COLACE) 100 MG capsule Take 1 capsule (100 mg total) by mouth 2 (two) times daily. 08/17/21 08/17/22  Ward, Delice Bison, DO  gabapentin (NEURONTIN) 300 MG capsule Take 1 capsule (300 mg total) by mouth 3 (three) times daily. Patient not taking: Reported on 08/28/2021 07/21/21   Virginia Crews, MD  Javier Docker Oil 1000 MG CAPS Take 1,000 mg by mouth daily.     [provider]  metFORMIN (GLUCOPHAGE XR) 750 MG 24 hr tablet Take 1 tablet (750 mg total) by mouth daily with breakfast. 04/10/21   Gwyneth Sprout, FNP  Meth-Hyo-M Barnett Hatter Phos-Ph Sal (URIBEL) 118 MG CAPS Take 1 capsule (118 mg total) by mouth every 6 (six) hours as needed (dysuria). 02/11/20   Royston Cowper, MD  metoprolol tartrate (LOPRESSOR) 25 MG tablet Take 1 tablet (25 mg total) by mouth 2 (two) times daily. 11/25/18   Gladstone Lighter, MD  MULTIPLE VITAMIN PO Take 1 tablet by mouth daily.    [provider]  nitroGLYCERIN (NITROSTAT) 0.4 MG SL tablet Place 1  tablet (0.4 mg total) under the tongue every 5 (five) minutes as needed for chest pain. 11/25/18   Gladstone Lighter, MD  ondansetron (ZOFRAN-ODT) 4 MG disintegrating tablet Take 1 tablet (4 mg total) by mouth every 6 (six) hours as needed for nausea or vomiting. Patient not taking: Reported on 08/28/2021 08/17/21   Ward, Delice Bison, DO  OneTouch Delica Lancets 02R MISC TEST FASTING GLUCOSE LEVEL EACH MORNING BEFORE BREAKFAST 04/29/20   Jerrol Banana., MD  pantoprazole (PROTONIX) 40 MG tablet Take 40 mg by mouth daily. 01/21/19   [provider]  ramipril (ALTACE) 10 MG capsule Take 1 capsule (10 mg total) by mouth daily. 11/25/18   Gladstone Lighter, MD  tamsulosin (FLOMAX) 0.4 MG CAPS capsule Take 0.4 mg by mouth daily. 02/12/19   [provider]     Family History  Problem Relation Age of Onset   Pulmonary embolism Mother    Transient ischemic attack Mother    Diabetes Mother    Pancreatic cancer Father    Hypertension Father    Diabetes Father    Cirrhosis Brother     Social History   Socioeconomic History   Marital status: Married  Spouse name: Not on file   Number of children: 2   Years of education: Not on file   Highest education level: 8th grade  Occupational History   Occupation: retired  Tobacco Use   Smoking status: Former    Packs/day: 1.00    Years: 30.00    Total pack years: 30.00    Types: Cigarettes    Quit date: 03/05/1984    Years since quitting: 37.5   Smokeless tobacco: Current    Types: Chew  Vaping Use   Vaping Use: Never used  Substance and Sexual Activity   Alcohol use: No    Alcohol/week: 0.0 standard drinks of alcohol   Drug use: No   Sexual activity: Not on file  Other Topics Concern   Not on file  Social History Narrative   Not on file   Social Determinants of Health   Financial Resource Strain: Low Risk  (04/07/2020)   Overall Financial Resource Strain (CARDIA)    Difficulty of Paying Living Expenses: Not hard  at all  Food Insecurity: No Food Insecurity (04/07/2020)   Hunger Vital Sign    Worried About Running Out of Food in the Last Year: Never true    Hazel in the Last Year: Never true  Transportation Needs: No Transportation Needs (04/07/2020)   PRAPARE - Hydrologist (Medical): No    Lack of Transportation (Non-Medical): No  Physical Activity: Inactive (04/07/2020)   Exercise Vital Sign    Days of Exercise per Week: 0 days    Minutes of Exercise per Session: 0 min  Stress: No Stress Concern Present (04/07/2020)   Gas    Feeling of Stress : Not at all  Social Connections: Moderately Integrated (04/07/2020)   Social Connection and Isolation Panel [NHANES]    Frequency of Communication with Friends and Family: More than three times a week    Frequency of Social Gatherings with Friends and Family: More than three times a week    Attends Religious Services: More than 4 times per year    Active Member of [employer] or Organizations: No    Attends Archivist Meetings: Never    Marital Status: Married     Review of Systems: A 12 point ROS discussed and pertinent positives are indicated in the HPI above.  All other systems are negative.  Review of Systems  Constitutional:  Negative for chills and fever.  Respiratory:  Negative for cough and shortness of breath.   Cardiovascular:  Negative for chest pain.  Gastrointestinal:  Negative for abdominal pain, diarrhea, nausea and vomiting.  Musculoskeletal:  Positive for neck pain. Negative for back pain.  Neurological:  Negative for dizziness and headaches.    Vital Signs: BP (!) 110/56   Pulse 64   Temp 97.8 F (36.6 C) (Oral)   Resp (!) 22   Ht 5\' 9"  (1.753 m)   Wt 175 lb (79.4 kg)   SpO2 98%   BMI 25.84 kg/m   Physical Exam Vitals reviewed.  Constitutional:      General: He is not in acute distress. HENT:     Head:  Normocephalic.     Mouth/Throat:     Mouth: Mucous membranes are moist.     Pharynx: Oropharynx is clear. No oropharyngeal exudate or posterior oropharyngeal erythema.  Cardiovascular:     Rate and Rhythm: Normal rate and regular rhythm.  Pulmonary:  Effort: Pulmonary effort is normal.     Breath sounds: Normal breath sounds.  Abdominal:     General: There is no distension.     Palpations: Abdomen is soft.     Tenderness: There is no abdominal tenderness.  Skin:    General: Skin is warm and dry.  Neurological:     Mental Status: He is alert and oriented to person, place, and time.  Psychiatric:        Mood and Affect: Mood normal.        Behavior: Behavior normal.        Thought Content: Thought content normal.        Judgment: Judgment normal.      MD Evaluation Airway: WNL Heart: WNL Abdomen: WNL Chest/ Lungs: WNL ASA  Classification: 3 Mallampati/Airway Score: Two   Imaging: MR Brain W Wo Contrast  Result Date: 09/01/2021 CLINICAL DATA:  Lung mass, staging EXAM: MRI HEAD WITHOUT AND WITH CONTRAST TECHNIQUE: Multiplanar, multiecho pulse sequences of the brain and surrounding structures were obtained without and with intravenous contrast. CONTRAST:  7.44mL GADAVIST GADOBUTROL 1 MMOL/ML IV SOLN COMPARISON:  CT head 08/18/2018 FINDINGS: Brain: There is no acute intracranial hemorrhage, extra-axial fluid collection, or acute infarct. Parenchymal volume is normal for age. The ventricles are normal in size. There is no significant burden of chronic white matter disease. Gray-white differentiation is preserved. There is no abnormal enhancement. There is no suspicious mass lesion. There is no mass effect or midline shift. Vascular: Normal flow voids. Skull and upper cervical spine: Normal marrow signal. Spinal stenosis at C3-C4 is again seen common better evaluated on recent cervical spine MRI. Sinuses/Orbits: There is mild mucosal thickening in the paranasal sinuses. Bilateral lens  implants are in place. The globes and orbits are otherwise unremarkable. Other: None. IMPRESSION: No evidence of intracranial metastatic disease. Electronically Signed   By: Valetta Mole M.D.   On: 09/01/2021 11:38   NM PET Image Initial (PI) Skull Base To Thigh  Result Date: 08/30/2021 CLINICAL DATA:  Initial treatment strategy for right lung mass. EXAM: NUCLEAR MEDICINE PET SKULL BASE TO THIGH TECHNIQUE: 10.5 mCi F-18 FDG was injected intravenously. Full-ring PET imaging was performed from the skull base to thigh after the radiotracer. CT data was obtained and used for attenuation correction and anatomic localization. Fasting blood glucose: 105 mg/dl COMPARISON:  Chest CTA on 08/17/2021 FINDINGS: Mediastinal blood-pool activity (background): SUV max = 2.0 Liver activity (reference): SUV max = N/A NECK:  No hypermetabolic lymph nodes or masses. Incidental CT findings:  None. CHEST: Macrolobulated mass in the lateral right upper lobe measures 4.8 x 4.6 cm and shows marked hypermetabolism, with SUV max of 16.3. No other suspicious pulmonary nodules or masses are seen on CT images. Mild centrilobular emphysema noted. No evidence of pleural effusion. Mild asymmetric FDG activity is seen in the right suprahilar region with SUV max of 3.1. No hypermetabolic mediastinal or axillary lymph nodes are identified. Incidental CT findings:  None. ABDOMEN/PELVIS: No abnormal hypermetabolic activity within the liver, pancreas, adrenal glands, or spleen. No hypermetabolic lymph nodes in the abdomen or pelvis. Incidental CT findings: Right upper pole renal cyst. Left-sided colonic diverticulosis, without evidence of diverticulitis. Mild to moderately enlarged prostate. Aortic atherosclerotic calcification incidentally noted. SKELETON: No focal hypermetabolic bone lesions to suggest skeletal metastasis. Incidental CT findings:  None. IMPRESSION: Hypermetabolic 4.8 cm right upper lobe mass, consistent with primary bronchogenic  carcinoma. Mild asymmetric FDG activity in the right suprahilar region, suspicious for right  hilar lymph node metastasis. No evidence of distant metastatic disease. Aortic Atherosclerosis (ICD10-I70.0) and Emphysema (ICD10-J43.9). Electronically Signed   By: Marlaine Hind M.D.   On: 08/30/2021 09:38   MR Cervical Spine Wo Contrast  Result Date: 08/17/2021 CLINICAL DATA:  76 year old male with new onset neck stiffness and pain with decreased range of motion. No known injury. Advanced cervical spine degeneration and evidence of spinal stenosis on cervical spine CT today. EXAM: MRI CERVICAL SPINE WITHOUT CONTRAST TECHNIQUE: Multiplanar, multisequence MR imaging of the cervical spine was performed. No intravenous contrast was administered. COMPARISON:  Cervical spine CT, CTA neck, CTA chest today reported separately. FINDINGS: Alignment: Stable to the earlier CT, degenerative appearing spondylolisthesis at C3-C4 and C4-C5. Vertebrae: Normal background bone marrow signal. Degenerative endplate marrow signal changes at C5 and C6. However, T3 vertebral body patchy anterior decreased T1 signal and STIR hyperintense lesion which demonstrates patchy sclerosis on CT today. This is indeterminate for bone metastasis in light of the right lung findings. No other marrow edema or evidence of acute osseous abnormality. Cord: No spinal cord signal abnormality despite multilevel degenerative cord mass effect detailed below. Negative visible upper thoracic spinal canal and cord. Posterior Fossa, vertebral arteries, paraspinal tissues: Cervicomedullary junction is within normal limits. Preserved major vascular flow voids in the neck, the left vertebral artery appears dominant. Negative visible neck soft tissues. Chest reported separately today. Disc levels: C2-C3: Moderate facet hypertrophy on the right. Minor disc bulging. Foraminal endplate spurring mostly on the right. Moderate to severe right C3 foraminal stenosis. C3-C4:  Moderate degenerative spinal stenosis and spinal cord mass effect (series 9, image 9 and series 12, image 11). Anterolisthesis with bulky broad-based posterior disc, also facet and ligament flavum hypertrophy. Severe bilateral C4 foraminal stenosis. C4-C5: Mild to moderate multifactorial spinal stenosis and spinal cord mass effect (series 9, image 9 and series 12, image 14). Anterolisthesis with bulky posterior disc and also facet and ligament flavum hypertrophy. Severe bilateral C5 foraminal stenosis. C5-C6: Severe disc space loss. Circumferential disc osteophyte complex eccentric to the right. Mild to moderate facet hypertrophy. Mild spinal stenosis and ventral cord mass effect. Severe bilateral C6 foraminal stenosis. C6-C7: Similar danced disc space loss with bulky circumferential disc osteophyte complex. Broad-based posterior component. Borderline to mild spinal stenosis. No cord mass effect. Moderate to severe bilateral C7 foraminal stenosis. C7-T1:  Moderate facet hypertrophy but no stenosis. IMPRESSION: 1. Nonspecific 10 mm T3 vertebral body lesion is indeterminate for bone metastasis in light of the right lung findings today. Recommend attention on PET-CT. No evidence for cervical spine metastasis on this noncontrast exam. 2. 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 Mild spinal stenosis at C5-C6 and C6-C7. But no associated spinal cord signal abnormality. 3. Associated severe neural foraminal stenosis at the right C3, bilateral C4, C5, C6 and C7 nerve levels. Electronically Signed   By: Genevie Ann M.D.   On: 08/17/2021 06:46   CT Angio Chest PE W and/or Wo Contrast  Result Date: 08/17/2021 CLINICAL DATA:  76 year old male with suspected pulmonary embolism. EXAM: CT ANGIOGRAPHY CHEST WITH CONTRAST TECHNIQUE: Multidetector CT imaging of the chest was performed using the standard protocol during bolus administration of intravenous  contrast. Multiplanar CT image reconstructions and MIPs were obtained to evaluate the vascular anatomy. RADIATION DOSE REDUCTION: This exam was performed according to the departmental dose-optimization program which includes automated exposure control, adjustment of the mA and/or kV according to patient size  and/or use of iterative reconstruction technique. CONTRAST:  68mL OMNIPAQUE IOHEXOL 350 MG/ML SOLN COMPARISON:  No priors. FINDINGS: Cardiovascular: There are no filling defects within the pulmonary arterial tree to suggest pulmonary embolism. Heart size is normal. There is no significant pericardial fluid, thickening or pericardial calcification. There is aortic atherosclerosis, as well as atherosclerosis of the great vessels of the mediastinum and the coronary arteries, including calcified atherosclerotic plaque in the left main, left anterior descending, left circumflex and right coronary arteries. Coronary artery stents are noted in the left anterior descending, left circumflex and right coronary arteries. Mediastinum/Nodes: Multiple prominent borderline enlarged right hilar lymph nodes measuring up to 1.2 cm in short axis. No other definite mediastinal or left hilar lymphadenopathy noted. Esophagus is unremarkable in appearance. No axillary lymphadenopathy. Lungs/Pleura: In the periphery of the inferior aspect of the right upper lobe (axial image 46 of series 5 and coronal image 74 of series 7) there is a 4.5 x 4.9 x 3.7 cm macrolobulated mass with slightly spiculated margins, which makes contact with the overlying pleura both laterally and inferiorly (the minor fissure). There is contour irregularity associated with the minor fissure, with a suggestion of early trans fissural invasion into the right middle lobe which is not definite at this time, but highly suspicious. No other definite suspicious appearing pulmonary nodules or masses are noted. No acute consolidative airspace disease. No pleural  effusions. Mild-to-moderate centrilobular and paraseptal emphysema. Mild diffuse bronchial wall thickening. Upper Abdomen: Atherosclerotic calcifications in the thoracic aorta. Incompletely imaged exophytic low-attenuation lesion in the upper pole of the right kidney measuring at least 6.6 cm in diameter, incompletely characterized on today's examination, but with potential enhancement along the superolateral margin of the lesion (best appreciated on axial image 130 of series 4). Musculoskeletal: There are no aggressive appearing lytic or blastic lesions noted in the visualized portions of the skeleton. Review of the MIP images confirms the above findings. IMPRESSION: 1. No evidence of pulmonary embolism. 2. 4.5 x 4.9 x 3.7 cm aggressive appearing right upper lobe mass with probable pleural involvement, potential early invasion into the right middle lobe and borderline enlarged but suspicious right hilar lymph nodes which likely represent early metastatic disease. Further evaluation with PET-CT is recommended in the near future. 3. Aortic atherosclerosis, in addition to left main and three-vessel coronary artery disease. Assessment for potential risk factor modification, dietary therapy or pharmacologic therapy may be warranted, if clinically indicated. 4. Mild diffuse bronchial wall thickening with mild to moderate centrilobular and paraseptal emphysema; imaging findings suggestive of underlying COPD. Aortic Atherosclerosis (ICD10-I70.0) and Emphysema (ICD10-J43.9). Electronically Signed   By: Vinnie Langton M.D.   On: 08/17/2021 06:10   CT Angio Neck W and/or Wo Contrast  Result Date: 08/17/2021 CLINICAL DATA:  76 year old male with new onset neck stiffness and pain with decreased range of motion. No known injury. EXAM: CT ANGIOGRAPHY NECK TECHNIQUE: Multidetector CT imaging of the neck was performed using the standard protocol during bolus administration of intravenous contrast. Multiplanar CT image  reconstructions and MIPs were obtained to evaluate the vascular anatomy. Carotid stenosis measurements (when applicable) are obtained utilizing NASCET criteria, using the distal internal carotid diameter as the denominator. RADIATION DOSE REDUCTION: This exam was performed according to the departmental dose-optimization program which includes automated exposure control, adjustment of the mA and/or kV according to patient size and/or use of iterative reconstruction technique. CONTRAST:  59mL OMNIPAQUE IOHEXOL 350 MG/ML SOLN COMPARISON:  CT head and cervical spine today reported separately. FINDINGS:  CTA NECK Skeleton: Cervical spine CT detailed separately. Absent dentition. No acute osseous abnormality identified. Upper chest: Large lobulated right upper lobe lung mass, series 2, image 106, up to 5.4 cm long axis and with some spiculated margins. The lesion extends to the lateral pleural surface. Underlying emphysema. No pleural effusion. Visible major airways remain patent. No visible mediastinal or hilar lymphadenopathy. Other neck: Small benign appearing retention cyst of the right lateral pharyngeal wall on series 2, image 54. No cervical lymphadenopathy or suspicious neck mass identified. Aortic arch: Calcified aortic atherosclerosis. Three vessel arch configuration. Right carotid system: Brachiocephalic artery plaque and tortuosity with no significant stenosis. Similar plaque in the proximal right CCA without stenosis. Bulky calcified plaque at the right ICA origin with high-grade stenosis approaching a radiographic string sign on series 4, image 134, series 6, image 110. The right ICA remains patent to the skull base. Left carotid system: Mild motion artifact. Intermittent plaque with no significant stenosis to the skull base. Vertebral arteries: Mild proximal right subclavian artery plaque without stenosis. Plaque at the right vertebral artery origin with suspected mild stenosis. Non dominant right vertebral  artery otherwise patent to the skull base. Proximal left subclavian artery moderate soft and calcified plaque but no significant stenosis. Plaque adjacent to the right vertebral artery origin with no significant stenosis (series 5, image 116). Dominant left vertebral artery is tortuous but patent to the skull base with no stenosis. CTA HEAD Posterior circulation: Dominant left V4 segment with multifocal calcified plaque. Moderate to severe stenosis results on series 4, image 75, but the left vertebrobasilar junction is patent. Left PICA origin distal to that stenosis also patent. Diminutive right V4 segment beyond PICA. Visible basilar artery, tip, SCA and PCA origins are patent. Anterior circulation: Both ICA siphons are patent with moderate bilateral siphon calcified plaque. There is moderate right siphon stenosis at the anterior genu. Mild left siphon stenosis. Visible carotid termini, MCA and ACA origins are patent. Anatomic variants: Dominant left vertebral artery. Review of the MIP images confirms the above findings IMPRESSION: 1. Large Right Upper Lobe Lung Mass, estimated 5.4 cm. 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 But there is no visible mediastinal or hilar lymphadenopathy. Emphysema (ICD10-J43.9). 2. Severe right carotid atherosclerosis with High-grade stenosis at the Right ICA origin approaching a RADIOGRAPHIC STRING SIGN. And downstream moderate right ICA siphon stenosis. 3. Dominant Left Vertebral Artery with Moderate To Severe Left V4 segment stenosis. 4. No arterial dissection in the neck.  And mild stenosis otherwise. 5. Aortic Atherosclerosis (ICD10-I70.0). Head and Cervical spine CT reported separately. Electronically Signed   By: Genevie Ann M.D.   On: 08/17/2021 05:02   CT HEAD WO CONTRAST (5MM)  Result Date: 08/17/2021 CLINICAL DATA:  76 year old male with new onset neck stiffness and pain with decreased range of motion. No known injury. EXAM: CT HEAD WITHOUT  CONTRAST TECHNIQUE: Contiguous axial images were obtained from the base of the skull through the vertex without intravenous contrast. RADIATION DOSE REDUCTION: This exam was performed according to the departmental dose-optimization program which includes automated exposure control, adjustment of the mA and/or kV according to patient size and/or use of iterative reconstruction technique. COMPARISON:  CT cervical spine and CTA neck today reported separately. FINDINGS: Brain: There are few punctate calcifications scattered in the right hemisphere subarachnoid spaces (such as series 4, images 23 and 24). These could be postinflammatory or related to distal vessel atherosclerosis. No midline shift, ventriculomegaly, mass effect, evidence of mass lesion, intracranial hemorrhage  or evidence of cortically based acute infarction. Gray-white matter differentiation is within normal limits for age and no cortical encephalomalacia is identified. Vascular: Extensive Calcified atherosclerosis at the skull base. No suspicious intracranial vascular hyperdensity. Skull: No acute osseous abnormality identified. Sinuses/Orbits: Moderately opacified bilateral ethmoid air cells, right frontoethmoidal recess, right maxillary sinus. No sinus fluid level, but there is mild bubbly opacity. Tympanic cavities and mastoids appear clear. Other: Postoperative changes to both globes, otherwise negative visible orbit and scalp soft tissues. IMPRESSION: 1. No acute intracranial abnormality. Largely normal for age non contrast CT appearance of the Brain; advanced calcified atherosclerosis which might explain occasional punctate calcifications in the right hemisphere, versus post infectious sequelae. 2. Moderate bilateral paranasal sinus inflammation. Electronically Signed   By: Genevie Ann M.D.   On: 08/17/2021 04:54   CT Cervical Spine Wo Contrast  Result Date: 08/17/2021 CLINICAL DATA:  76 year old male with new onset neck stiffness and pain with  decreased range of motion. No known injury. EXAM: CT CERVICAL SPINE WITHOUT CONTRAST TECHNIQUE: Multidetector CT imaging of the cervical spine was performed without intravenous contrast. Multiplanar CT image reconstructions were also generated. RADIATION DOSE REDUCTION: This exam was performed according to the departmental dose-optimization program which includes automated exposure control, adjustment of the mA and/or kV according to patient size and/or use of iterative reconstruction technique. COMPARISON:  Head CT and neck CTA reported separately. FINDINGS: Alignment: Degenerative appearing anterolisthesis of C3 on C4 (1-2 mm) and C4 on C5 (3 mm). Maintained bilateral posterior element alignment. Cervicothoracic junction alignment is within normal limits. Skull base and vertebrae: Bone mineralization is within normal limits for age. Visualized skull base is intact. No atlanto-occipital dissociation. C1 and C2 appear intact and aligned. No acute osseous abnormality identified. Soft tissues and spinal canal: No prevertebral fluid or swelling. No visible canal hematoma. Hypopharynx motion artifact, but the visible noncontrast neck soft tissues appear negative aside from calcified atherosclerosis. Neck CTA is reported separately. Disc levels: Moderately advanced anterior C1-C2 degeneration. Widespread cervical disc degeneration, severe with endplate spurring T6-L4 and C6-C7. Moderate to severe upper cervical facet arthropathy which is bilateral at C3-C4 and C4-C5. Multilevel degenerative cervical spinal stenosis suspected including C3-C4, C5-C6, C6-C7. Upper chest: Visible upper thoracic levels appear intact. There is some centrilobular and paraseptal emphysema greater on the right. Calcified aortic atherosclerosis. Otherwise negative visible noncontrast superior mediastinum. Other: Head CT reported separately. IMPRESSION: 1. No acute osseous abnormality in the cervical spine. 2. Widespread advanced cervical spine  degeneration, including degenerative appearing spondylolisthesis at C3-C4 and C4-C5. Multilevel spinal stenosis is suspected and would be best characterized by noncontrast Cervical Spine MRI. 3. Aortic Atherosclerosis (ICD10-I70.0) and Emphysema (ICD10-J43.9). Electronically Signed   By: Genevie Ann M.D.   On: 08/17/2021 04:51    Labs:  CBC: Recent Labs    08/17/21 0451  WBC 8.7  HGB 11.9*  HCT 36.8*  PLT 244    COAGS: No results for input(s): "INR", "APTT" in the last 8760 hours.  BMP: Recent Labs    04/04/21 0926 06/19/21 0929 08/17/21 0451  NA 139  --  138  K 4.0  --  3.4*  CL 102  --  109  CO2 20  --  22  GLUCOSE 125*  --  154*  BUN 19  --  23  CALCIUM 9.6  --  8.8*  CREATININE 1.27 1.50* 1.05  GFRNONAA  --   --  >60    LIVER FUNCTION TESTS: Recent Labs    04/04/21 0926  BILITOT 0.5  AST 21  ALT 23  ALKPHOS 97  PROT 6.6  ALBUMIN 4.0    TUMOR MARKERS: No results for input(s): "AFPTM", "CEA", "CA199", "CHROMGRNA" in the last 8760 hours.  Assessment and Plan:  76 y/o M with recently noted RUL lung mass concerning for possible primary bronchogenic carcinoma who presents today for a lung mass biopsy to further direct care.  Patient history and imaging reviewed by Dr. Laurence Ferrari who approves procedure and is to be performed by Dr. Earleen Newport today.  Risks and benefits discussed with the patient including, but not limited to bleeding, hemoptysis, respiratory failure requiring intubation, infection, pneumothorax requiring chest tube placement, stroke from air embolism or even death.  All of the patient's questions were answered, patient is agreeable to proceed.  Consent signed and in chart.  Thank you for this interesting consult.  I greatly enjoyed [place] and look forward to participating in their care.  A copy of this report was sent to the requesting provider on this date.  Electronically Signed: Joaquim Nam, PA-C 09/08/2021, 10:17  AM   I spent a total of  30 Minutes   in face to face in clinical consultation, greater than 50% of which was counseling/coordinating care for right upper lobe lung mass biopsy.

## 2021-09-11 ENCOUNTER — Other Ambulatory Visit: Payer: Self-pay

## 2021-09-11 ENCOUNTER — Encounter: Payer: Self-pay | Admitting: Vascular Surgery

## 2021-09-11 ENCOUNTER — Encounter
Admission: RE | Disposition: A | Discharge status: Home / Self Care | Payer: Self-pay | Source: Home / Self Care | Attending: Vascular Surgery

## 2021-09-11 ENCOUNTER — Inpatient Hospital Stay
Admission: RE | Admit: 2021-09-11 | Discharge: 2021-09-12 | DRG: 035 | Disposition: A | Discharge status: Home / Self Care | Payer: Medicare Other | Attending: Vascular Surgery | Admitting: Vascular Surgery

## 2021-09-11 DIAGNOSIS — E1169 Type 2 diabetes mellitus with other specified complication: Secondary | ICD-10-CM | POA: Diagnosis present

## 2021-09-11 DIAGNOSIS — Z8249 Family history of ischemic heart disease and other diseases of the circulatory system: Secondary | ICD-10-CM

## 2021-09-11 DIAGNOSIS — Z7902 Long term (current) use of antithrombotics/antiplatelets: Secondary | ICD-10-CM | POA: Diagnosis not present

## 2021-09-11 DIAGNOSIS — I6521 Occlusion and stenosis of right carotid artery: Secondary | ICD-10-CM | POA: Diagnosis not present

## 2021-09-11 DIAGNOSIS — C3411 Malignant neoplasm of upper lobe, right bronchus or lung: Secondary | ICD-10-CM | POA: Diagnosis present

## 2021-09-11 DIAGNOSIS — Z79899 Other long term (current) drug therapy: Secondary | ICD-10-CM | POA: Diagnosis not present

## 2021-09-11 DIAGNOSIS — I2510 Atherosclerotic heart disease of native coronary artery without angina pectoris: Secondary | ICD-10-CM | POA: Diagnosis present

## 2021-09-11 DIAGNOSIS — Z833 Family history of diabetes mellitus: Secondary | ICD-10-CM | POA: Diagnosis not present

## 2021-09-11 DIAGNOSIS — I152 Hypertension secondary to endocrine disorders: Secondary | ICD-10-CM | POA: Diagnosis present

## 2021-09-11 DIAGNOSIS — Z955 Presence of coronary angioplasty implant and graft: Secondary | ICD-10-CM

## 2021-09-11 DIAGNOSIS — E7800 Pure hypercholesterolemia, unspecified: Secondary | ICD-10-CM | POA: Diagnosis present

## 2021-09-11 DIAGNOSIS — C3490 Malignant neoplasm of unspecified part of unspecified bronchus or lung: Secondary | ICD-10-CM | POA: Diagnosis not present

## 2021-09-11 DIAGNOSIS — Z885 Allergy status to narcotic agent status: Secondary | ICD-10-CM

## 2021-09-11 DIAGNOSIS — I252 Old myocardial infarction: Secondary | ICD-10-CM | POA: Diagnosis not present

## 2021-09-11 DIAGNOSIS — Z7984 Long term (current) use of oral hypoglycemic drugs: Secondary | ICD-10-CM | POA: Diagnosis not present

## 2021-09-11 DIAGNOSIS — Z800 Family history of malignant neoplasm of digestive organs: Secondary | ICD-10-CM

## 2021-09-11 DIAGNOSIS — F17220 Nicotine dependence, chewing tobacco, uncomplicated: Secondary | ICD-10-CM | POA: Diagnosis present

## 2021-09-11 DIAGNOSIS — I714 Abdominal aortic aneurysm, without rupture, unspecified: Secondary | ICD-10-CM

## 2021-09-11 HISTORY — PX: CAROTID PTA/STENT INTERVENTION: CATH118231

## 2021-09-11 LAB — MRSA NEXT GEN BY PCR, NASAL: MRSA by PCR Next Gen: NOT DETECTED

## 2021-09-11 LAB — CREATININE, SERUM
Creatinine, Ser: 1.18 mg/dL (ref 0.61–1.24)
GFR, Estimated: >60 mL/min (ref >60)

## 2021-09-11 LAB — GLUCOSE, CAPILLARY: Glucose-Capillary: 91 mg/dL (ref 70–99)

## 2021-09-11 LAB — BUN: BUN: 18 mg/dL (ref 8–23)

## 2021-09-11 SURGERY — CAROTID PTA/STENT INTERVENTION
Anesthesia: Moderate Sedation | Laterality: Right

## 2021-09-11 MED ORDER — OXYCODONE-ACETAMINOPHEN 5-325 MG PO TABS
1.0000 | ORAL_TABLET | ORAL | Status: DC | PRN
  Administered 2021-09-12: 1 via ORAL
  Filled 2021-09-11: qty 1

## 2021-09-11 MED ORDER — AMLODIPINE BESYLATE 5 MG PO TABS
ORAL_TABLET | ORAL | Status: AC
  Filled 2021-09-11: qty 2

## 2021-09-11 MED ORDER — DOPAMINE-DEXTROSE 3.2-5 MG/ML-% IV SOLN
INTRAVENOUS | Status: AC
  Filled 2021-09-11: qty 250

## 2021-09-11 MED ORDER — SODIUM CHLORIDE 0.9 % IV SOLN: INTRAVENOUS | Status: DC

## 2021-09-11 MED ORDER — CLOPIDOGREL BISULFATE 75 MG PO TABS
ORAL_TABLET | ORAL | Status: AC
  Filled 2021-09-11: qty 1

## 2021-09-11 MED ORDER — ACETAMINOPHEN 325 MG PO TABS
ORAL_TABLET | ORAL | Status: AC
  Filled 2021-09-11: qty 2

## 2021-09-11 MED ORDER — ONDANSETRON HCL 4 MG/2ML IJ SOLN
4.0000 mg | Freq: Four times a day (QID) | INTRAMUSCULAR | Status: DC | PRN
  Filled 2021-09-11: qty 2

## 2021-09-11 MED ORDER — TAMSULOSIN HCL 0.4 MG PO CAPS
0.4000 mg | ORAL_CAPSULE | Freq: Every day | ORAL | Status: DC
  Administered 2021-09-12: 0.4 mg via ORAL
  Filled 2021-09-11: qty 1

## 2021-09-11 MED ORDER — NITROGLYCERIN 0.4 MG SL SUBL
0.4000 mg | SUBLINGUAL_TABLET | SUBLINGUAL | Status: DC | PRN
Start: 2021-09-11 — End: 2021-09-12

## 2021-09-11 MED ORDER — PHENOL 1.4 % MT LIQD: 1.0000 | OROMUCOSAL | Status: DC | PRN

## 2021-09-11 MED ORDER — ONDANSETRON HCL 4 MG/2ML IJ SOLN
4.0000 mg | Freq: Four times a day (QID) | INTRAMUSCULAR | Status: DC | PRN
  Administered 2021-09-12: 4 mg via INTRAVENOUS

## 2021-09-11 MED ORDER — LABETALOL HCL 5 MG/ML IV SOLN: 10.0000 mg | INTRAVENOUS | Status: DC | PRN

## 2021-09-11 MED ORDER — CEFAZOLIN SODIUM-DEXTROSE 2-4 GM/100ML-% IV SOLN
2.0000 g | Freq: Three times a day (TID) | INTRAVENOUS | Status: AC
  Administered 2021-09-11 (×2): 2 g via INTRAVENOUS
  Filled 2021-09-11: qty 100

## 2021-09-11 MED ORDER — MIDAZOLAM HCL 2 MG/2ML IJ SOLN
INTRAMUSCULAR | Status: DC | PRN
  Administered 2021-09-11: 1 mg via INTRAVENOUS

## 2021-09-11 MED ORDER — ALUM & MAG HYDROXIDE-SIMETH 200-200-20 MG/5ML PO SUSP: 15.0000 mL | ORAL | Status: DC | PRN

## 2021-09-11 MED ORDER — MORPHINE SULFATE (PF) 4 MG/ML IV SOLN
2.0000 mg | INTRAVENOUS | Status: DC | PRN
  Administered 2021-09-12: 2 mg via INTRAVENOUS
  Administered 2021-09-12: 4 mg via INTRAVENOUS
  Filled 2021-09-11 (×2): qty 1

## 2021-09-11 MED ORDER — CEFAZOLIN SODIUM-DEXTROSE 1-4 GM/50ML-% IV SOLN
INTRAVENOUS | Status: DC | PRN
  Administered 2021-09-11: 2 g via INTRAVENOUS

## 2021-09-11 MED ORDER — CEFAZOLIN SODIUM-DEXTROSE 2-4 GM/100ML-% IV SOLN: 2.0000 g | INTRAVENOUS | Status: DC

## 2021-09-11 MED ORDER — ATORVASTATIN CALCIUM 20 MG PO TABS
80.0000 mg | ORAL_TABLET | Freq: Every day | ORAL | Status: DC
  Administered 2021-09-11: 80 mg via ORAL
  Filled 2021-09-11: qty 4

## 2021-09-11 MED ORDER — PHENYLEPHRINE HCL-NACL 20-0.9 MG/250ML-% IV SOLN
INTRAVENOUS | Status: DC
Start: 2021-09-11 — End: 2021-09-11
  Filled 2021-09-11: qty 250

## 2021-09-11 MED ORDER — POTASSIUM CHLORIDE CRYS ER 20 MEQ PO TBCR: 20.0000 meq | EXTENDED_RELEASE_TABLET | Freq: Every day | ORAL | Status: DC | PRN

## 2021-09-11 MED ORDER — CLOPIDOGREL BISULFATE 75 MG PO TABS
75.0000 mg | ORAL_TABLET | Freq: Every day | ORAL | Status: DC
  Administered 2021-09-12: 75 mg via ORAL
  Filled 2021-09-11: qty 1

## 2021-09-11 MED ORDER — ATROPINE SULFATE 1 MG/10ML IJ SOSY
PREFILLED_SYRINGE | INTRAMUSCULAR | Status: AC
  Filled 2021-09-11: qty 10

## 2021-09-11 MED ORDER — ACETAMINOPHEN 325 MG RE SUPP: 325.0000 mg | RECTAL | Status: DC | PRN

## 2021-09-11 MED ORDER — KRILL OIL 1000 MG PO CAPS: 1000.0000 mg | ORAL_CAPSULE | Freq: Every day | ORAL | Status: DC

## 2021-09-11 MED ORDER — DIPHENHYDRAMINE HCL 50 MG/ML IJ SOLN: 50.0000 mg | Freq: Once | INTRAMUSCULAR | Status: DC | PRN

## 2021-09-11 MED ORDER — METOPROLOL TARTRATE 25 MG PO TABS
25.0000 mg | ORAL_TABLET | Freq: Two times a day (BID) | ORAL | Status: DC
  Administered 2021-09-12: 25 mg via ORAL
  Filled 2021-09-11: qty 1

## 2021-09-11 MED ORDER — HYDRALAZINE HCL 20 MG/ML IJ SOLN: 5.0000 mg | INTRAMUSCULAR | Status: DC | PRN

## 2021-09-11 MED ORDER — COENZYME Q10 30 MG PO CAPS: 30.0000 mg | ORAL_CAPSULE | Freq: Every day | ORAL | Status: DC

## 2021-09-11 MED ORDER — HEPARIN SODIUM (PORCINE) 1000 UNIT/ML IJ SOLN
INTRAMUSCULAR | Status: DC | PRN
  Administered 2021-09-11: 8000 [IU] via INTRAVENOUS

## 2021-09-11 MED ORDER — AMLODIPINE BESYLATE 10 MG PO TABS
10.0000 mg | ORAL_TABLET | Freq: Every day | ORAL | Status: DC
  Administered 2021-09-12: 10 mg via ORAL
  Filled 2021-09-11: qty 1

## 2021-09-11 MED ORDER — GUAIFENESIN-DM 100-10 MG/5ML PO SYRP: 15.0000 mL | ORAL_SOLUTION | ORAL | Status: DC | PRN

## 2021-09-11 MED ORDER — CHLORHEXIDINE GLUCONATE CLOTH 2 % EX PADS
6.0000 | MEDICATED_PAD | Freq: Every day | CUTANEOUS | Status: DC
  Administered 2021-09-11: 6 via TOPICAL

## 2021-09-11 MED ORDER — PANTOPRAZOLE SODIUM 40 MG PO TBEC
40.0000 mg | DELAYED_RELEASE_TABLET | Freq: Every day | ORAL | Status: DC
  Administered 2021-09-12: 40 mg via ORAL
  Filled 2021-09-11: qty 1

## 2021-09-11 MED ORDER — METHYLPREDNISOLONE SODIUM SUCC 125 MG IJ SOLR: 125.0000 mg | Freq: Once | INTRAMUSCULAR | Status: DC | PRN

## 2021-09-11 MED ORDER — METOPROLOL TARTRATE 5 MG/5ML IV SOLN: 2.0000 mg | INTRAVENOUS | Status: DC | PRN

## 2021-09-11 MED ORDER — FAMOTIDINE 20 MG PO TABS: 40.0000 mg | ORAL_TABLET | Freq: Once | ORAL | Status: DC | PRN

## 2021-09-11 MED ORDER — FENTANYL CITRATE (PF) 100 MCG/2ML IJ SOLN
INTRAMUSCULAR | Status: AC
  Filled 2021-09-11: qty 2

## 2021-09-11 MED ORDER — HYDROMORPHONE HCL 1 MG/ML IJ SOLN: 1.0000 mg | Freq: Once | INTRAMUSCULAR | Status: DC | PRN

## 2021-09-11 MED ORDER — RAMIPRIL 10 MG PO CAPS
10.0000 mg | ORAL_CAPSULE | Freq: Every day | ORAL | Status: DC
  Administered 2021-09-12: 10 mg via ORAL
  Filled 2021-09-11: qty 1

## 2021-09-11 MED ORDER — IODIXANOL 320 MG/ML IV SOLN
INTRAVENOUS | Status: DC | PRN
  Administered 2021-09-11: 85 mL via INTRA_ARTERIAL

## 2021-09-11 MED ORDER — SODIUM CHLORIDE 0.9 % IV SOLN
500.0000 mL | Freq: Once | INTRAVENOUS | Status: AC | PRN
  Administered 2021-09-11: 500 mL via INTRAVENOUS

## 2021-09-11 MED ORDER — HEPARIN SODIUM (PORCINE) 1000 UNIT/ML IJ SOLN
INTRAMUSCULAR | Status: AC
  Filled 2021-09-11: qty 10

## 2021-09-11 MED ORDER — PHENYLEPHRINE 80 MCG/ML (10ML) SYRINGE FOR IV PUSH (FOR BLOOD PRESSURE SUPPORT)
PREFILLED_SYRINGE | INTRAVENOUS | Status: AC
  Filled 2021-09-11: qty 10

## 2021-09-11 MED ORDER — FAMOTIDINE 20 MG PO TABS
ORAL_TABLET | ORAL | Status: AC
  Filled 2021-09-11: qty 1

## 2021-09-11 MED ORDER — DOCUSATE SODIUM 100 MG PO CAPS
100.0000 mg | ORAL_CAPSULE | Freq: Two times a day (BID) | ORAL | Status: DC
  Administered 2021-09-12: 100 mg via ORAL
  Filled 2021-09-11: qty 1

## 2021-09-11 MED ORDER — MAGNESIUM SULFATE 2 GM/50ML IV SOLN: 2.0000 g | Freq: Every day | INTRAVENOUS | Status: DC | PRN

## 2021-09-11 MED ORDER — MIDAZOLAM HCL 2 MG/2ML IJ SOLN
INTRAMUSCULAR | Status: AC
  Filled 2021-09-11: qty 2

## 2021-09-11 MED ORDER — FENTANYL CITRATE (PF) 100 MCG/2ML IJ SOLN
INTRAMUSCULAR | Status: DC | PRN
  Administered 2021-09-11: 50 ug via INTRAVENOUS
  Administered 2021-09-11: 25 ug via INTRAVENOUS

## 2021-09-11 MED ORDER — ATROPINE SULFATE 1 MG/ML IV SOLN
INTRAVENOUS | Status: DC | PRN
  Administered 2021-09-11: 1 mg via INTRAVENOUS

## 2021-09-11 MED ORDER — ACETAMINOPHEN 325 MG PO TABS
325.0000 mg | ORAL_TABLET | ORAL | Status: DC | PRN
  Administered 2021-09-11 – 2021-09-12 (×2): 650 mg via ORAL
  Filled 2021-09-11: qty 2

## 2021-09-11 MED ORDER — MIDAZOLAM HCL 2 MG/ML PO SYRP: 8.0000 mg | ORAL_SOLUTION | Freq: Once | ORAL | Status: DC | PRN

## 2021-09-11 MED ORDER — FAMOTIDINE IN NACL 20-0.9 MG/50ML-% IV SOLN
20.0000 mg | Freq: Two times a day (BID) | INTRAVENOUS | Status: DC
  Administered 2021-09-11: 20 mg via INTRAVENOUS
  Filled 2021-09-11: qty 50

## 2021-09-11 SURGICAL SUPPLY — 19 items
BALLN VIATRAC 5X20X135 (BALLOONS) ×2
BALLOON VIATRAC 5X20X135 (BALLOONS) IMPLANT
CATH ANGIO 5F PIGTAIL 100CM (CATHETERS) ×1 IMPLANT
CATH BEACON 5 .035 100 H1 TIP (CATHETERS) ×1 IMPLANT
COVER PROBE U/S 5X48 (MISCELLANEOUS) ×1 IMPLANT
DEVICE EMBOSHIELD NAV6 4.0-7.0 (FILTER) ×1 IMPLANT
DEVICE STARCLOSE SE CLOSURE (Vascular Products) ×1 IMPLANT
DEVICE TORQUE (MISCELLANEOUS) ×1 IMPLANT
GLIDEWIRE ANGLED SS 035X260CM (WIRE) ×1 IMPLANT
GUIDEWIRE VASC STIFF .038X260 (WIRE) ×1 IMPLANT
KIT CAROTID MANIFOLD (MISCELLANEOUS) ×1 IMPLANT
KIT ENCORE 26 ADVANTAGE (KITS) ×1 IMPLANT
PACK ANGIOGRAPHY (CUSTOM PROCEDURE TRAY) ×2 IMPLANT
SHEATH BRITE TIP 6FRX11 (SHEATH) ×1 IMPLANT
SHEATH BRITE TIP 6FRX5.5 (SHEATH) ×1 IMPLANT
SHEATH SHUTTLE 6FRX80 (SHEATH) ×1 IMPLANT
STENT XACT CAR 9-7X40X136 (Permanent Stent) ×1 IMPLANT
SYR MEDRAD MARK 7 150ML (SYRINGE) ×1 IMPLANT
WIRE GUIDERIGHT .035X150 (WIRE) ×1 IMPLANT

## 2021-09-11 NOTE — Progress Notes (Addendum)
1845 Admitted from vascular recovery via stretcher.

## 2021-09-11 NOTE — Interval H&P Note (Signed)
History and Physical Interval Note:  09/11/2021 8:20 AM  Luke Rojas  has presented today for surgery, with the diagnosis of R Carotid Stent    [place]     Carotid artery stenosis.  The various methods of treatment have been discussed with the patient and family. After consideration of risks, benefits and other options for treatment, the patient has consented to  Procedure(s): CAROTID PTA/STENT INTERVENTION (Right) as a surgical intervention.  The patient's history has been reviewed, patient examined, no change in status, stable for surgery.  I have reviewed the patient's chart and labs.  Questions were answered to the patient's satisfaction.     Leotis Pain

## 2021-09-11 NOTE — Op Note (Signed)
OPERATIVE NOTE DATE: 09/11/2021  PROCEDURE:  Ultrasound guidance for vascular access right femoral artery  Placement of a 9 mm proximal, 7 mm distal, 4 cm long exact stent with the use of the NAV-6 embolic protection device in the right carotid artery  PRE-OPERATIVE DIAGNOSIS: 1.  High-grade right carotid artery stenosis. 2.  Lung cancer  POST-OPERATIVE DIAGNOSIS:  Same as above  SURGEON: Leotis Pain, MD  ASSISTANT(S): None  ANESTHESIA: local/MCS  ESTIMATED BLOOD LOSS: 20 cc  CONTRAST: 85 cc  FLUORO TIME: 6 minutes  MODERATE CONSCIOUS SEDATION TIME:  Approximately 28 minutes using 1 mg of Versed and 75 mcg of Fentanyl  FINDING(S): 1.   80% carotid artery stenosis  SPECIMEN(S):   none  INDICATIONS:   Patient is a 76 y.o. male who presents with right carotid artery stenosis.  The patient has recently diagnosed lung cancer and carotid artery stenting was felt to be preferred to endarterectomy for that reason.  Risks and benefits were discussed and informed consent was obtained.   DESCRIPTION: After obtaining full informed written consent, the patient was brought back to the vascular suite and placed supine upon the table.  The patient received IV antibiotics prior to induction. Moderate conscious sedation was administered during a face to face encounter with the patient throughout the procedure with my supervision of the RN administering medicines and monitoring the patients vital signs and mental status throughout from the start of the procedure until the patient was taken to the recovery room.  After obtaining adequate anesthesia, the patient was prepped and draped in the standard fashion.   The right femoral artery was visualized with ultrasound and found to be widely patent. It was then accessed under direct ultrasound guidance without difficulty with a Seldinger needle. A permanent image was recorded. A J-wire was placed and we then placed a 6 French sheath. The patient was  then heparinized and a total of 8000 units of intravenous heparin were given and an ACT was checked to confirm successful anticoagulation. A pigtail catheter was then placed into the ascending aorta. This showed a type I aortic arch without proximal stenosis. I then selectively cannulated the innominate artery without difficulty with a headhunter catheter and advanced into the mid right common carotid artery.  Cervical and cerebral carotid angiography was then performed. There were no obvious intracranial filling defects with slightly sluggish anterior cerebral flow. The carotid bifurcation demonstrated an 80% proximal right internal carotid artery stenosis at the bifurcation with poststenotic dilatation.  I then advanced into the external carotid artery with a Glidewire and the headhunter catheter and then exchanged for the Amplatz Super Stiff wire. Over the Amplatz Super Stiff wire, a 6 [place] shuttle sheath was placed into the mid common carotid artery. I then used the NAV-6  Embolic protection device and crossed the lesion and parked this in the distal internal carotid artery at the base of the skull.  I then selected a 9 mm proximal, 7 mm distal 4 cm long exact stent. This was deployed across the lesion encompassing it in its entirety. A 5 mm diameter by 2 cm length balloon was used to post dilate the stent. Only about a 15-20% residual stenosis was present after angioplasty. Completion angiogram showed normal intracranial filling without new defects. At this point I elected to terminate the procedure. The sheath was removed and StarClose closure device was deployed in the right femoral artery with excellent hemostatic result. The patient was taken to the recovery room in  stable condition having tolerated the procedure well.  COMPLICATIONS: none  CONDITION: stable  Leotis Pain 09/11/2021 9:03 AM   This note was created with Dragon Medical transcription system. Any errors in dictation are purely  unintentional.

## 2021-09-12 ENCOUNTER — Telehealth (INDEPENDENT_AMBULATORY_CARE_PROVIDER_SITE_OTHER): Payer: Self-pay

## 2021-09-12 ENCOUNTER — Encounter: Payer: Self-pay | Admitting: Vascular Surgery

## 2021-09-12 ENCOUNTER — Other Ambulatory Visit: Payer: Self-pay | Admitting: Anatomic Pathology & Clinical Pathology

## 2021-09-12 LAB — BASIC METABOLIC PANEL
Anion gap: 5 (ref 5–15)
BUN: 18 mg/dL (ref 8–23)
CO2: 23 mmol/L (ref 22–32)
Calcium: 8.2 mg/dL — ABNORMAL LOW (ref 8.9–10.3)
Chloride: 109 mmol/L (ref 98–111)
Creatinine, Ser: 1.13 mg/dL (ref 0.61–1.24)
GFR, Estimated: >60 mL/min (ref >60)
Glucose, Bld: 100 mg/dL — ABNORMAL HIGH (ref 70–99)
Potassium: 4.5 mmol/L (ref 3.5–5.1)
Sodium: 137 mmol/L (ref 135–145)

## 2021-09-12 LAB — GLUCOSE, CAPILLARY: Glucose-Capillary: 91 mg/dL (ref 70–99)

## 2021-09-12 LAB — SURGICAL PATHOLOGY

## 2021-09-12 LAB — CBC
HCT: 31.8 % — ABNORMAL LOW (ref 39.0–52.0)
Hemoglobin: 10.2 g/dL — ABNORMAL LOW (ref 13.0–17.0)
MCH: 27.1 pg (ref 26.0–34.0)
MCHC: 32.1 g/dL (ref 30.0–36.0)
MCV: 84.4 fL (ref 80.0–100.0)
Platelets: 191 10*3/uL (ref 150–400)
RBC: 3.77 MIL/uL — ABNORMAL LOW (ref 4.22–5.81)
RDW: 15.1 % (ref 11.5–15.5)
WBC: 6.9 10*3/uL (ref 4.0–10.5)
nRBC: 0 % (ref 0.0–0.2)

## 2021-09-12 LAB — POCT ACTIVATED CLOTTING TIME: Activated Clotting Time: 317 seconds

## 2021-09-12 MED ORDER — FAMOTIDINE 20 MG PO TABS
20.0000 mg | ORAL_TABLET | Freq: Two times a day (BID) | ORAL | Status: DC
  Administered 2021-09-12: 20 mg via ORAL
  Filled 2021-09-12: qty 1

## 2021-09-12 MED ORDER — OXYCODONE-ACETAMINOPHEN 5-325 MG PO TABS: 1.0000 | ORAL_TABLET | ORAL | 0 refills | Status: DC | PRN

## 2021-09-12 MED ORDER — ASPIRIN 81 MG PO TBEC: 81.0000 mg | DELAYED_RELEASE_TABLET | Freq: Every day | ORAL | 12 refills | Status: DC

## 2021-09-12 MED ORDER — ASPIRIN 81 MG PO TBEC
81.0000 mg | DELAYED_RELEASE_TABLET | Freq: Every day | ORAL | Status: DC
  Administered 2021-09-12: 81 mg via ORAL
  Filled 2021-09-12: qty 1

## 2021-09-12 NOTE — Telephone Encounter (Signed)
Patient's spouse called concerned about the patient taking Aspirin and Plavix at the same time. I did advise that this is normal due to the procedure he had to keep continued blood flow. I did have her speak with Eulogio Ditch NP to answer the patient's questions.

## 2021-09-12 NOTE — Telephone Encounter (Signed)
Patient spouse left message inquiring when should her husband be able to take a bath. Patient had carotid stent placed on yesterday. I spoke wit Arna Medici NP she recommended that the patient can remove bandage.replace with Band-Aid,and take a bath the day after tomorrow. I left a message on patient voicemail.

## 2021-09-12 NOTE — Progress Notes (Addendum)
1100 Discharge teaching done. 1107 Discharged home with wife and son.

## 2021-09-12 NOTE — Discharge Summary (Signed)
[facility]    Discharge Summary    Patient ID:  Luke Rojas MRN: [id number] DOB/AGE: Jan 25, 1946 76 y.o.  Admit date: 09/11/2021 Discharge date: 09/12/2021 Date of Surgery: 09/11/2021 Surgeon: Surgeon(s): Lucky Cowboy Erskine Squibb, MD  Admission Diagnosis: Carotid stenosis, right [I65.21]  Discharge Diagnoses:  Carotid stenosis, right [I65.21]  Secondary Diagnoses: Past Medical History:  Diagnosis Date   Arthritis    OSTEOARTHRITIS   Chronic kidney disease    kidney stones   COPD (chronic obstructive pulmonary disease) ([place])    Coronary artery disease    2 stents    Diabetes mellitus without complication ([place])    GERD (gastroesophageal reflux disease)    Hypercholesteremia    Hypertension    Myocardial infarction [facility]) 4196   Renal colic     Procedure(s): CAROTID PTA/STENT INTERVENTION  Discharged Condition: good  HPI:  Luke Rojas is a 76 year old male who underwent placement of a right ICA stent on 09/11/2021.  This was initially found incidentally via CT scan after the patient complained of significant neck pain and discomfort.  It was noted at the time of intervention the patient had an 80% lesion in the right internal carotid artery.  Postprocedure the patient is doing well without significant issue.  Hospital Course:  Luke Rojas is a 76 y.o. male is S/P Right Carotid stent placement  Procedure(s): CAROTID PTA/STENT INTERVENTION Extubated: POD # 0 Physical exam: No neurological deficits,groin soft  Post-op wounds clean, dry, intact or healing well Pt. Ambulating, voiding and taking PO diet without difficulty. Pt pain controlled with PO pain meds. Labs as below Complications:none  Consults:    Significant Diagnostic Studies: CBC Lab Results  Component Value Date   WBC 6.9 09/12/2021   HGB 10.2 (L) 09/12/2021   HCT 31.8 (L) 09/12/2021   MCV 84.4 09/12/2021   PLT 191 09/12/2021    BMET    Component Value  Date/Time   NA 137 09/12/2021 0432   NA 139 04/04/2021 0926   NA 139 10/02/2011 1051   K 4.5 09/12/2021 0432   K 4.2 10/02/2011 1051   CL 109 09/12/2021 0432   CL 106 10/02/2011 1051   CO2 23 09/12/2021 0432   CO2 27 10/02/2011 1051   GLUCOSE 100 (H) 09/12/2021 0432   GLUCOSE 97 10/02/2011 1051   BUN 18 09/12/2021 0432   BUN 19 04/04/2021 0926   BUN 21 (H) 10/02/2011 1051   CREATININE 1.13 09/12/2021 0432   CREATININE 1.88 (H) 10/02/2011 1051   CALCIUM 8.2 (L) 09/12/2021 0432   CALCIUM 8.4 (L) 10/02/2011 1051   GFRNONAA >60 09/12/2021 0432   GFRNONAA 36 (L) 10/02/2011 1051   GFRAA 60 12/04/2019 0810   GFRAA 42 (L) 10/02/2011 1051   COAG Lab Results  Component Value Date   INR 1.1 09/08/2021     Disposition:  Discharge to :Home  Allergies as of 09/12/2021       Reactions   Codeine Nausea Only, Nausea And Vomiting, Other (See Comments)   Other reaction(s): Vomiting        Medication List     TAKE these medications    amLODipine 10 MG tablet Commonly known as: NORVASC Take 1 tablet (10 mg total) by mouth daily.   aspirin EC 81 MG tablet Take 1 tablet (81 mg total) by mouth daily. Swallow whole.   atorvastatin 80 MG tablet Commonly known as: LIPITOR TAKE 1 TABLET BY MOUTH EVERY NIGHT AT BEDTIME   clopidogrel 75  MG tablet Commonly known as: PLAVIX Take 75 mg by mouth daily.   co-enzyme Q-10 30 MG capsule Take 30 mg by mouth daily.   diclofenac sodium 1 % Gel Commonly known as: VOLTAREN Apply 2 g topically 4 (four) times daily.   docusate sodium 100 MG capsule Commonly known as: Colace Take 1 capsule (100 mg total) by mouth 2 (two) times daily.   gabapentin 300 MG capsule Commonly known as: NEURONTIN Take 1 capsule (300 mg total) by mouth 3 (three) times daily.   Krill Oil 1000 MG Caps Take 1,000 mg by mouth daily.   metFORMIN 750 MG 24 hr tablet Commonly known as: Glucophage XR Take 1 tablet (750 mg total) by mouth daily with breakfast.    metoprolol tartrate 25 MG tablet Commonly known as: LOPRESSOR Take 1 tablet (25 mg total) by mouth 2 (two) times daily.   MULTIPLE VITAMIN PO Take 1 tablet by mouth daily.   nitroGLYCERIN 0.4 MG SL tablet Commonly known as: Nitrostat Place 1 tablet (0.4 mg total) under the tongue every 5 (five) minutes as needed for chest pain.   ondansetron 4 MG disintegrating tablet Commonly known as: ZOFRAN-ODT Take 1 tablet (4 mg total) by mouth every 6 (six) hours as needed for nausea or vomiting.   OneTouch Delica Lancets 27C Misc TEST FASTING GLUCOSE LEVEL EACH MORNING BEFORE BREAKFAST   oxyCODONE-acetaminophen 5-325 MG tablet Commonly known as: PERCOCET/ROXICET Take 1 tablet by mouth every 4 (four) hours as needed for moderate pain.   pantoprazole 40 MG tablet Commonly known as: PROTONIX Take 40 mg by mouth daily.   ramipril 10 MG capsule Commonly known as: ALTACE Take 1 capsule (10 mg total) by mouth daily.   tamsulosin 0.4 MG Caps capsule Commonly known as: FLOMAX Take 0.4 mg by mouth daily.   Uribel 118 MG Caps Take 1 capsule (118 mg total) by mouth every 6 (six) hours as needed (dysuria).       Verbal and written Discharge instructions given to the patient. Wound care per Discharge AVS  Follow-up Information     Dew, Erskine Squibb, MD Follow up in 4 week(s).   Specialties: Vascular Surgery, Radiology, Interventional Cardiology Why: See JD/FB in 4 weeks with carotid Contact information: [place] [address] [phone]                 Signed: Kris Hartmann, NP  09/12/2021, 9:31 AM

## 2021-09-15 ENCOUNTER — Ambulatory Visit: Payer: Medicare Other | Admitting: Oncology

## 2021-09-18 ENCOUNTER — Inpatient Hospital Stay: Payer: Medicare Other | Attending: Oncology | Admitting: Oncology

## 2021-09-18 ENCOUNTER — Encounter: Payer: Self-pay | Admitting: Radiation Oncology

## 2021-09-18 ENCOUNTER — Ambulatory Visit
Admission: RE | Admit: 2021-09-18 | Discharge: 2021-09-18 | Disposition: A | Discharge status: Home / Self Care | Payer: Medicare Other | Source: Ambulatory Visit | Attending: Radiation Oncology | Admitting: Radiation Oncology

## 2021-09-18 ENCOUNTER — Encounter: Payer: Self-pay | Admitting: Oncology

## 2021-09-18 ENCOUNTER — Encounter: Payer: Self-pay | Admitting: *Deleted

## 2021-09-18 VITALS — BP 112/69 | HR 61 | Temp 97.6°F | Resp 20 | Wt 170.9 lb

## 2021-09-18 DIAGNOSIS — Z87891 Personal history of nicotine dependence: Secondary | ICD-10-CM | POA: Insufficient documentation

## 2021-09-18 DIAGNOSIS — I2510 Atherosclerotic heart disease of native coronary artery without angina pectoris: Secondary | ICD-10-CM | POA: Insufficient documentation

## 2021-09-18 DIAGNOSIS — K219 Gastro-esophageal reflux disease without esophagitis: Secondary | ICD-10-CM | POA: Insufficient documentation

## 2021-09-18 DIAGNOSIS — M542 Cervicalgia: Secondary | ICD-10-CM | POA: Insufficient documentation

## 2021-09-18 DIAGNOSIS — Z5111 Encounter for antineoplastic chemotherapy: Secondary | ICD-10-CM | POA: Diagnosis not present

## 2021-09-18 DIAGNOSIS — C3411 Malignant neoplasm of upper lobe, right bronchus or lung: Secondary | ICD-10-CM

## 2021-09-18 DIAGNOSIS — Z7902 Long term (current) use of antithrombotics/antiplatelets: Secondary | ICD-10-CM | POA: Insufficient documentation

## 2021-09-18 DIAGNOSIS — I129 Hypertensive chronic kidney disease with stage 1 through stage 4 chronic kidney disease, or unspecified chronic kidney disease: Secondary | ICD-10-CM | POA: Insufficient documentation

## 2021-09-18 DIAGNOSIS — I252 Old myocardial infarction: Secondary | ICD-10-CM | POA: Insufficient documentation

## 2021-09-18 DIAGNOSIS — J449 Chronic obstructive pulmonary disease, unspecified: Secondary | ICD-10-CM | POA: Insufficient documentation

## 2021-09-18 DIAGNOSIS — Z800 Family history of malignant neoplasm of digestive organs: Secondary | ICD-10-CM | POA: Insufficient documentation

## 2021-09-18 DIAGNOSIS — Z7984 Long term (current) use of oral hypoglycemic drugs: Secondary | ICD-10-CM | POA: Insufficient documentation

## 2021-09-18 DIAGNOSIS — C801 Malignant (primary) neoplasm, unspecified: Secondary | ICD-10-CM | POA: Insufficient documentation

## 2021-09-18 DIAGNOSIS — Z791 Long term (current) use of non-steroidal anti-inflammatories (NSAID): Secondary | ICD-10-CM | POA: Insufficient documentation

## 2021-09-18 DIAGNOSIS — Z7189 Other specified counseling: Secondary | ICD-10-CM

## 2021-09-18 DIAGNOSIS — E7800 Pure hypercholesterolemia, unspecified: Secondary | ICD-10-CM | POA: Insufficient documentation

## 2021-09-18 DIAGNOSIS — R918 Other nonspecific abnormal finding of lung field: Secondary | ICD-10-CM

## 2021-09-18 DIAGNOSIS — Z79899 Other long term (current) drug therapy: Secondary | ICD-10-CM | POA: Insufficient documentation

## 2021-09-18 DIAGNOSIS — E1122 Type 2 diabetes mellitus with diabetic chronic kidney disease: Secondary | ICD-10-CM | POA: Insufficient documentation

## 2021-09-18 MED ORDER — LIDOCAINE-PRILOCAINE 2.5-2.5 % EX CREA: TOPICAL_CREAM | CUTANEOUS | 3 refills | Status: DC

## 2021-09-18 MED ORDER — DEXAMETHASONE 4 MG PO TABS: 8.0000 mg | ORAL_TABLET | Freq: Every day | ORAL | 1 refills | Status: DC

## 2021-09-18 MED ORDER — ONDANSETRON HCL 8 MG PO TABS: 8.0000 mg | ORAL_TABLET | Freq: Two times a day (BID) | ORAL | 1 refills | Status: DC | PRN

## 2021-09-18 MED ORDER — PROCHLORPERAZINE MALEATE 10 MG PO TABS: 10.0000 mg | ORAL_TABLET | Freq: Four times a day (QID) | ORAL | 1 refills | Status: DC | PRN

## 2021-09-18 NOTE — Progress Notes (Signed)
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  He quit smoking about 30 years ago but still chews tobacco.  He presented to the ER with symptoms of neck pain which led to an MRI cervical spine as well as CT angio chest.  MRI cervical spine showed degenerative changes along with moderate spinal stenosis and mass effect between C3-C4 and C4-C5.  Foraminal stenosis at C3-C4 C5-C6-C7 nerve levels.  Nonspecific 10 mm T3 vertebral body lesion indeterminate for bone metastases.  CT angio chest showed a 4.5 x 4.9 x 3.7 cm right upper lobe peripheral mass with probable pleural involvement and potential early invasion into right middle lobe.  Borderline enlarged right hilar lymph nodes measuring up to 1.2 cm.  PET CT scan showed hypermetabolic 4.8 cm right upper lobe mass consistent with primary bronchogenic carcinoma and mild asymmetric FDG activity in the right suprahilar region suspicious for right hilar lymph node metastases.  Patient had a CT-guided right upper lobe lung biopsy which was consistent with squamous cell carcinoma.    Interval history-patient is here with his family his wife and daughter.  Overall he is doing well and denies any specific complaints at this time  ECOG PS-  1 Pain scale- 0 Opioid associated constipation- no  Review of systems- Review of Systems  Constitutional:  Positive for malaise/fatigue. Negative for chills, fever and weight loss.  HENT:  Negative for congestion, ear discharge and nosebleeds.   Eyes:  Negative for blurred vision.  Respiratory:  Negative for cough, hemoptysis, sputum production, shortness of breath and wheezing.   Cardiovascular:  Negative for chest pain, palpitations, orthopnea and claudication.  Gastrointestinal:  Negative for abdominal pain, blood in stool, constipation, diarrhea, heartburn, melena, nausea and vomiting.  Genitourinary:  Negative for dysuria, flank pain, frequency, hematuria and urgency.  Musculoskeletal:  Negative for back pain, joint pain and myalgias.  Skin:  Negative for rash.  Neurological:  Negative for dizziness, tingling, focal weakness, seizures, weakness and headaches.  Endo/Heme/Allergies:  Does not bruise/bleed easily.  Psychiatric/Behavioral:  Negative for depression and suicidal ideas. The patient does not have insomnia.       Allergies  Allergen Reactions   Codeine Nausea Only, Nausea And Vomiting and Other (See Comments)    Other reaction(s): Vomiting     Past Medical History:  Diagnosis Date   Arthritis    OSTEOARTHRITIS   Chronic kidney disease    kidney stones   COPD (chronic obstructive pulmonary disease) (HCC)    Coronary artery disease    2 stents    Diabetes mellitus without complication (HCC)    GERD (gastroesophageal reflux disease)    Hypercholesteremia    Hypertension    Myocardial infarction [facility]) 9983   Renal colic      Past Surgical History:  Procedure Laterality Date   CARDIAC CATHETERIZATION  2013   2  stents   CAROTID PTA/STENT INTERVENTION Right 09/11/2021   Procedure: CAROTID PTA/STENT INTERVENTION;  Surgeon: Algernon Huxley, MD;  Location: [place] [facility];  Service: Cardiovascular;  Laterality: Right;   CATARACT EXTRACTION, BILATERAL      COLONOSCOPY  04/27/05   COLONOSCOPY WITH PROPOFOL N/A 06/08/2015   Procedure: COLONOSCOPY WITH PROPOFOL;  Surgeon: Robert Bellow, MD;  Location: [facility] END[facility];  Service: Endoscopy;  Laterality: N/A;   CYSTOSCOPY WITH INSERTION OF UROLIFT N/A 02/11/2020   Procedure: CYSTOSCOPY WITH INSERTION OF UROLIFT;  Surgeon: Royston Cowper, MD;  Location: [facility] ORS;  Service: Urology;  Laterality: N/A;   CYSTOSCOPY WITH STENT PLACEMENT Right 09/16/2016   Procedure: CYSTOSCOPY WITH STENT PLACEMENT;  Surgeon: Royston Cowper, MD;  Location: [facility] ORS;  Service: Urology;  Laterality: Right;   EXTRACORPOREAL SHOCK WAVE LITHOTRIPSY Left 11/04/2014   has had 2 previous lithotripsies   EXTRACORPOREAL SHOCK WAVE LITHOTRIPSY Left 03/24/2015   Procedure: EXTRACORPOREAL SHOCK WAVE LITHOTRIPSY (ESWL);  Surgeon: Royston Cowper, MD;  Location: [facility] ORS;  Service: Urology;  Laterality: Left;   EXTRACORPOREAL SHOCK WAVE LITHOTRIPSY Right 10/04/2016   Procedure: EXTRACORPOREAL SHOCK WAVE LITHOTRIPSY (ESWL);  Surgeon: Royston Cowper, MD;  Location: [facility] OR[facility];  Service: Urology;  Laterality: Right;   LEFT HEART CATH AND CORONARY ANGIOGRAPHY N/A 12/05/2018   Procedure: LEFT HEART CATH AND CORONARY ANGIOGRAPHY with possible pci and stent;  Surgeon: Yolonda Kida, MD;  Location: [place] [facility];  Service: Cardiovascular;  Laterality: N/A;    Social History   Socioeconomic History   Marital status: Married    Spouse name: Not on file   Number of children: 2   Years of education: Not on file   Highest education level: 8th grade  Occupational History   Occupation: retired  Tobacco Use   Smoking status: Former    Packs/day: 1.00    Years: 30.00    Total pack years: 30.00    Types: Cigarettes    Quit date: 03/05/1984    Years since quitting: 37.5   Smokeless tobacco: Current    Types: Chew   Tobacco comments:    Chews tobacco daily.  Vaping Use   Vaping Use: Never used  Substance and Sexual Activity   Alcohol  use: No    Alcohol/week: 0.0 standard drinks of alcohol   Drug use: No   Sexual activity: Not on file  Other Topics Concern   Not on file  Social History Narrative   Lives with Zigmund Daniel, wife, and son [place].   Social Determinants of Health   Financial Resource Strain: Low Risk  (04/07/2020)   Overall Financial Resource Strain (CARDIA)    Difficulty of Paying Living Expenses: Not hard at all  Food Insecurity: No Food Insecurity (04/07/2020)   Hunger Vital Sign    Worried About Running Out of Food in the Last Year: Never true    Ran Out of Food in the Last Year: Never true  Transportation Needs: No Transportation Needs (04/07/2020)   PRAPARE - Hydrologist (Medical): No    Lack of Transportation (Non-Medical): No  Physical Activity: Inactive (04/07/2020)   Exercise Vital Sign    Days of Exercise per Week: 0 days    Minutes of Exercise per Session: 0 min  Stress: No Stress Concern Present (04/07/2020)   [place]    Feeling of Stress : Not at all  Social Connections: Moderately Integrated (  04/07/2020)   Social Connection and Isolation Panel [NHANES]    Frequency of Communication with Friends and Family: More than three times a week    Frequency of Social Gatherings with Friends and Family: More than three times a week    Attends Religious Services: More than 4 times per year    Active Member of [employer] or Organizations: No    Attends Archivist Meetings: Never    Marital Status: Married  Human resources officer Violence: Not At Risk (04/07/2020)   Humiliation, Afraid, Rape, and Kick questionnaire    Fear of Current or Ex-Partner: No    Emotionally Abused: No    Physically Abused: No    Sexually Abused: No    Family History  Problem Relation Age of Onset   Pulmonary embolism Mother    Transient ischemic attack Mother    Diabetes Mother    Pancreatic cancer Father    Hypertension Father     Diabetes Father    Cirrhosis Brother      Current Outpatient Medications:    amLODipine (NORVASC) 10 MG tablet, Take 10 mg by mouth daily., Disp: , Rfl:    aspirin EC 81 MG tablet, Take 1 tablet (81 mg total) by mouth daily. Swallow whole., Disp: 30 tablet, Rfl: 12   atorvastatin (LIPITOR) 80 MG tablet, TAKE 1 TABLET BY MOUTH EVERY NIGHT AT BEDTIME, Disp: 90 tablet, Rfl: 3   clopidogrel (PLAVIX) 75 MG tablet, Take 75 mg by mouth daily., Disp: , Rfl:    co-enzyme Q-10 30 MG capsule, Take 30 mg by mouth daily. , Disp: , Rfl:    diclofenac sodium (VOLTAREN) 1 % GEL, Apply 2 g topically 4 (four) times daily., Disp: 100 g, Rfl: 3   Krill Oil 1000 MG CAPS, Take 1,000 mg by mouth daily. , Disp: , Rfl:    metFORMIN (GLUCOPHAGE XR) 750 MG 24 hr tablet, Take 1 tablet (750 mg total) by mouth daily with breakfast., Disp: 90 tablet, Rfl: 1   metoprolol tartrate (LOPRESSOR) 25 MG tablet, Take 1 tablet (25 mg total) by mouth 2 (two) times daily., Disp: 60 tablet, Rfl: 2   MULTIPLE VITAMIN PO, Take 1 tablet by mouth daily., Disp: , Rfl:    OneTouch Delica Lancets 49F MISC, TEST FASTING GLUCOSE LEVEL EACH MORNING BEFORE BREAKFAST, Disp: 100 each, Rfl: 4   pantoprazole (PROTONIX) 40 MG tablet, Take 40 mg by mouth daily., Disp: , Rfl:    ramipril (ALTACE) 10 MG capsule, Take 1 capsule (10 mg total) by mouth daily., Disp: 30 capsule, Rfl: 2   tamsulosin (FLOMAX) 0.4 MG CAPS capsule, Take 0.4 mg by mouth daily., Disp: , Rfl:    dexamethasone (DECADRON) 4 MG tablet, Take 2 tablets (8 mg total) by mouth daily. Start the day after chemotherapy for 2 days., Disp: 30 tablet, Rfl: 1   docusate sodium (COLACE) 100 MG capsule, Take 1 capsule (100 mg total) by mouth 2 (two) times daily. (Patient not taking: Reported on 09/18/2021), Disp: 60 capsule, Rfl: 0   gabapentin (NEURONTIN) 300 MG capsule, Take 1 capsule (300 mg total) by mouth 3 (three) times daily. (Patient not taking: Reported on 08/28/2021), Disp: 90 capsule, Rfl:  1   lidocaine-prilocaine (EMLA) cream, Apply to affected area once, Disp: 30 g, Rfl: 3   Meth-Hyo-M Bl-Na Phos-Ph Sal (URIBEL) 118 MG CAPS, Take 1 capsule (118 mg total) by mouth every 6 (six) hours as needed (dysuria). (Patient not taking: Reported on 09/11/2021), Disp: 40 capsule, Rfl: 3  nitroGLYCERIN (NITROSTAT) 0.4 MG SL tablet, Place 1 tablet (0.4 mg total) under the tongue every 5 (five) minutes as needed for chest pain. (Patient not taking: Reported on 09/18/2021), Disp: 30 tablet, Rfl: 1   ondansetron (ZOFRAN) 8 MG tablet, Take 1 tablet (8 mg total) by mouth 2 (two) times daily as needed for refractory nausea / vomiting. Start on day 3 after chemo., Disp: 30 tablet, Rfl: 1   ondansetron (ZOFRAN-ODT) 4 MG disintegrating tablet, Take 1 tablet (4 mg total) by mouth every 6 (six) hours as needed for nausea or vomiting. (Patient not taking: Reported on 08/28/2021), Disp: 20 tablet, Rfl: 0   oxyCODONE-acetaminophen (PERCOCET/ROXICET) 5-325 MG tablet, Take 1 tablet by mouth every 4 (four) hours as needed for moderate pain. (Patient not taking: Reported on 09/18/2021), Disp: 20 tablet, Rfl: 0   prochlorperazine (COMPAZINE) 10 MG tablet, Take 1 tablet (10 mg total) by mouth every 6 (six) hours as needed (Nausea or vomiting)., Disp: 30 tablet, Rfl: 1  Physical exam:  Vitals:   09/18/21 1122  BP: 112/69  Pulse: 61  Resp: 20  Temp: 97.6 F (36.4 C)  Weight: 170 lb 14.4 oz (77.5 kg)   Physical Exam Constitutional:      General: He is not in acute distress. Cardiovascular:     Rate and Rhythm: Normal rate and regular rhythm.     Heart sounds: Normal heart sounds.  Pulmonary:     Effort: Pulmonary effort is normal.     Breath sounds: Normal breath sounds.  Abdominal:     General: Bowel sounds are normal.     Palpations: Abdomen is soft.  Skin:    General: Skin is warm and dry.  Neurological:     Mental Status: He is alert and oriented to person, place, and time.         Latest Ref Rng &  Units 09/12/2021    4:32 AM  CMP  Glucose 70 - 99 mg/dL 100   BUN 8 - 23 mg/dL 18   Creatinine 0.61 - 1.24 mg/dL 1.13   Sodium 135 - 145 mmol/L 137   Potassium 3.5 - 5.1 mmol/L 4.5   Chloride 98 - 111 mmol/L 109   CO2 22 - 32 mmol/L 23   Calcium 8.9 - 10.3 mg/dL 8.2       Latest Ref Rng & Units 09/12/2021    4:32 AM  CBC  WBC 4.0 - 10.5 K/uL 6.9   Hemoglobin 13.0 - 17.0 g/dL 10.2   Hematocrit 39.0 - 52.0 % 31.8   Platelets 150 - 400 K/uL 191       PERIPHERAL VASCULAR CATHETERIZATION  Result Date: 09/11/2021 See surgical note for result.  DG Chest Port 1 View  Result Date: 09/08/2021 CLINICAL DATA:  76 year old male status post right lung biopsy EXAM: PORTABLE CHEST 1 VIEW COMPARISON:  09/08/2021 FINDINGS: Cardiomediastinal silhouette unchanged in size and contour. Opacity in the right upper lobe, with known FDG avid mass. No pneumothorax or pleural effusion.  Low lung volumes. Coarsened interstitial markings throughout. IMPRESSION: Negative for pneumothorax status post right lung mass biopsy Electronically Signed   By: Corrie Mckusick D.O.   On: 09/08/2021 12:38   CT LUNG MASS BIOPSY  Result Date: 09/08/2021 INDICATION: 76 year old male with right upper lobe mass referred for biopsy EXAM: CT BIOPSY CORE LUNG/MEDIASTINUM MEDICATIONS: None. ANESTHESIA/SEDATION: Moderate (conscious) sedation was employed during this procedure. A total of Versed 2.0 mg and Fentanyl 75 mcg was administered intravenously. Moderate Sedation Time: 14  minutes. The patient's level of consciousness and vital signs were monitored continuously by radiology nursing throughout the procedure under my direct supervision. FLUOROSCOPY TIME:  CT COMPLICATIONS: None PROCEDURE: The procedure, risks, benefits, and alternatives were explained to the patient and the patient's family. Specific risks that were addressed included bleeding, infection, pneumothorax, need for further procedure including chest tube placement, chance of  delayed pneumothorax or hemorrhage, hemoptysis, nondiagnostic sample, cardiopulmonary collapse, death. Questions regarding the procedure were encouraged and answered. The patient understands and consents to the procedure. Patient was positioned in the supine position on the CT gantry table and a scout CT of the chest was performed for planning purposes. Once angle of approach was determined, the skin and subcutaneous tissues this scan was prepped and draped in the usual sterile fashion, and a sterile drape was applied covering the operative field. A sterile gown and sterile gloves were used for the procedure. Local anesthesia was provided with 1% Lidocaine. The skin and subcutaneous tissues were infiltrated 1% lidocaine for local anesthesia, and a small stab incision was made with an 11 blade scalpel. Using CT guidance, a 17 gauge trocar needle was advanced into the right upper lobe pleural basedtarget. After confirmation of the tip, separate 18 gauge core biopsies were performed. These were placed into solution for transportation to the lab. A final CT image was performed. Patient tolerated the procedure well and remained hemodynamically stable throughout. No complications were encountered and no significant blood loss was encounter FINDINGS: Small pneumothorax at the completion, with no pulmonary hemorrhage IMPRESSION: Status post CT-guided biopsy of right upper lobe pleural base target. Signed, Dulcy Fanny. Nadene Rubins, RPVI [facility] Electronically Signed   By: Corrie Mckusick D.O.   On: 09/08/2021 11:54   MR Brain W Wo Contrast  Result Date: 09/01/2021 CLINICAL DATA:  Lung mass, staging EXAM: MRI HEAD WITHOUT AND WITH CONTRAST TECHNIQUE: Multiplanar, multiecho pulse sequences of the brain and surrounding structures were obtained without and with intravenous contrast. CONTRAST:  7.21mL GADAVIST GADOBUTROL 1 MMOL/ML IV SOLN COMPARISON:  CT head  08/18/2018 FINDINGS: Brain: There is no acute intracranial hemorrhage, extra-axial fluid collection, or acute infarct. Parenchymal volume is normal for age. The ventricles are normal in size. There is no significant burden of chronic white matter disease. Gray-white differentiation is preserved. There is no abnormal enhancement. There is no suspicious mass lesion. There is no mass effect or midline shift. Vascular: Normal flow voids. Skull and upper cervical spine: Normal marrow signal. Spinal stenosis at C3-C4 is again seen common better evaluated on recent cervical spine MRI. Sinuses/Orbits: There is mild mucosal thickening in the paranasal sinuses. Bilateral lens implants are in place. The globes and orbits are otherwise unremarkable. Other: None. IMPRESSION: No evidence of intracranial metastatic disease. Electronically Signed   By: Valetta Mole M.D.   On: 09/01/2021 11:38   NM PET Image Initial (PI) Skull Base To Thigh  Result Date: 08/30/2021 CLINICAL DATA:  Initial treatment strategy for right lung mass. EXAM: NUCLEAR MEDICINE PET SKULL BASE TO THIGH TECHNIQUE: 10.5 mCi F-18 FDG was injected intravenously. Full-ring PET imaging was performed from the skull base to thigh after the radiotracer. CT data was obtained and used for attenuation correction and anatomic localization. Fasting blood glucose: 105 mg/dl COMPARISON:  Chest CTA on 08/17/2021 FINDINGS: Mediastinal blood-pool activity (background): SUV max = 2.0 Liver activity (reference): SUV max = N/A NECK:  No hypermetabolic lymph nodes or masses. Incidental CT findings:  None.  CHEST: Macrolobulated mass in the lateral right upper lobe measures 4.8 x 4.6 cm and shows marked hypermetabolism, with SUV max of 16.3. No other suspicious pulmonary nodules or masses are seen on CT images. Mild centrilobular emphysema noted. No evidence of pleural effusion. Mild asymmetric FDG activity is seen in the right suprahilar region with SUV max of 3.1. No  hypermetabolic mediastinal or axillary lymph nodes are identified. Incidental CT findings:  None. ABDOMEN/PELVIS: No abnormal hypermetabolic activity within the liver, pancreas, adrenal glands, or spleen. No hypermetabolic lymph nodes in the abdomen or pelvis. Incidental CT findings: Right upper pole renal cyst. Left-sided colonic diverticulosis, without evidence of diverticulitis. Mild to moderately enlarged prostate. Aortic atherosclerotic calcification incidentally noted. SKELETON: No focal hypermetabolic bone lesions to suggest skeletal metastasis. Incidental CT findings:  None. IMPRESSION: Hypermetabolic 4.8 cm right upper lobe mass, consistent with primary bronchogenic carcinoma. Mild asymmetric FDG activity in the right suprahilar region, suspicious for right hilar lymph node metastasis. No evidence of distant metastatic disease. Aortic Atherosclerosis (ICD10-I70.0) and Emphysema (ICD10-J43.9). Electronically Signed   By: Marlaine Hind M.D.   On: 08/30/2021 09:38     Assessment and plan- Patient is a 76 y.o. male with newly diagnosed squamous cell carcinoma of the right upper lobe stage II BC T2N 1M0  Discussed with the patient that the right upper lobe lung biopsy was consistent with squamous cell carcinoma.  CT-guided biopsy was deemed to be safe for his age and therefore his hilar lymph node was not sampled with appears suspicious on PET scan and for now is presumed to be positive.  At this time I would recommend concurrent chemoradiation with weekly CarboTaxol.  Carboplatin given at AUC 2 along with Taxol at 45 mg per metered square.  Discussed risks and benefits of chemotherapy including all but not limited to nausea, vomiting, low blood counts, risk of infections and hospitalizations.  Risk of peripheral neuropathy and infusion reaction associated with both carboplatin and Taxol.  Treatment will be given with a curative intent.  Patient understands and agrees to proceed as planned.  He will need  port placement and chemotherapy teach prior to start of chemotherapy and I will tentatively see him in 2 weeks to start cycle 1 of treatment.  Treatment will be given with a curative intent  If he has good response to treatment after concurrent chemoradiation I will consider maintenance immunotherapy for 1 year following that.     Cancer Staging  Malignant neoplasm of upper lobe of right lung [facility]) Staging form: Lung, AJCC 8th Edition - Clinical stage from 09/18/2021: Stage IIB (cT2, cN1, cM0) - Signed by Sindy Guadeloupe, MD on 09/18/2021 Histopathologic type: Squamous cell carcinoma, NOS   Visit Diagnosis 1. Malignant neoplasm of upper lobe of right lung ([place])   2. Goals of care, counseling/discussion      Dr. Randa Evens, MD, MPH [facility] [phone] 09/18/2021 4:43 PM

## 2021-09-18 NOTE — Progress Notes (Signed)
START ON PATHWAY REGIMEN - Non-Small Cell Lung     A cycle is every 7 days, concurrent with RT:     Paclitaxel      Carboplatin   **Always confirm dose/schedule in your pharmacy ordering system**  Patient Characteristics: Preoperative or Nonsurgical Candidate (Clinical Staging), Stage II, Nonsurgical Candidate Therapeutic Status: Preoperative or Nonsurgical Candidate (Clinical Staging) AJCC T Category: cT2 AJCC N Category: cN1 AJCC M Category: cM0 AJCC 8 Stage Grouping: IIB Intent of Therapy: Curative Intent, Discussed with Patient

## 2021-09-18 NOTE — Consult Note (Signed)
NEW PATIENT EVALUATION  Name: Luke Rojas  MRN: [id number]  Date:   09/18/2021     DOB: Sep 09, 1945   This 76 y.o. 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  REFERRING PHYSICIAN: Virginia Crews, MD  CHIEF COMPLAINT:  Chief Complaint  Patient presents with   Lung Cancer    DIAGNOSIS: The encounter diagnosis was Lung mass.   PREVIOUS INVESTIGATIONS:  PET/CT CT scans MRI scans all reviewed Pathology reports reviewed Clinical notes reviewed  HPI: Patient is a 76 year old male with a longstanding smoking history although quit approximately 30 years prior.  His main concern has been neck pain.  He had a MRI scan of his cervical spine back in mid June showing a nonspecific 1 cm T3 vertebral body lesion indeterminate for metastasis.  Most advanced cervical spine degeneration with mild to moderate spinal stenosis.  He is got a consult in next month with Dr. Cari Caraway.  CT angiography at the time showed a 4.5 x 4.9 cm aggressive appearing right upper lobe mass with probable pleural involvement with invasion to the right middle lobe and borderline right hilar adenopathy.  Both these areas were hypermetabolic on PET CT scan the cervical lesion was not hypermetabolic.  This was all consistent with stage IIIa disease.  He underwent CT-guided biopsy which was positive for non-small cell lung cancer favoring squamous cell carcinoma.  MRI of his brain was negative for metastatic disease.  He has been seen by medical oncology.  He is now referred to radiation [facility] for opinion.  From a pulmonary standpoint he is doing fairly well specifically [place] significant cough hemoptysis or chest tightness.  PLANNED TREATMENT REGIMEN: Concurrent chemoradiation  PAST MEDICAL HISTORY:  has a past medical history of Arthritis, Chronic kidney disease, COPD (chronic  obstructive pulmonary disease) ([place]), Coronary artery disease, Diabetes mellitus without complication ([place]), GERD (gastroesophageal reflux disease), Hypercholesteremia, Hypertension, Myocardial infarction ([place]) (2423), and Renal colic.    PAST SURGICAL HISTORY:  Past Surgical History:  Procedure Laterality Date   CARDIAC CATHETERIZATION  2013   2 stents   CAROTID PTA/STENT INTERVENTION Right 09/11/2021   Procedure: CAROTID PTA/STENT INTERVENTION;  Surgeon: Algernon Huxley, MD;  Location: [facility];  Service: Cardiovascular;  Laterality: Right;   CATARACT EXTRACTION, BILATERAL     COLONOSCOPY  04/27/05   COLONOSCOPY WITH PROPOFOL N/A 06/08/2015   Procedure: COLONOSCOPY WITH PROPOFOL;  Surgeon: Robert Bellow, MD;  Location: [facility];  Service: Endoscopy;  Laterality: N/A;   CYSTOSCOPY WITH INSERTION OF UROLIFT N/A 02/11/2020   Procedure: CYSTOSCOPY WITH INSERTION OF UROLIFT;  Surgeon: Royston Cowper, MD;  Location: [facility];  Service: Urology;  Laterality: N/A;   CYSTOSCOPY WITH STENT PLACEMENT Right 09/16/2016   Procedure: CYSTOSCOPY WITH STENT PLACEMENT;  Surgeon: Royston Cowper, MD;  Location: [facility];  Service: Urology;  Laterality: Right;   EXTRACORPOREAL SHOCK WAVE LITHOTRIPSY Left 11/04/2014   has had 2 previous lithotripsies   EXTRACORPOREAL SHOCK WAVE LITHOTRIPSY Left 03/24/2015   Procedure: EXTRACORPOREAL SHOCK WAVE LITHOTRIPSY (ESWL);  Surgeon: Royston Cowper, MD;  Location: [facility];  Service: Urology;  Laterality: Left;   EXTRACORPOREAL SHOCK WAVE LITHOTRIPSY Right 10/04/2016   Procedure: EXTRACORPOREAL SHOCK WAVE LITHOTRIPSY (ESWL);  Surgeon: Royston Cowper, MD;  Location: [facility];  Service: Urology;  Laterality: Right;   LEFT HEART CATH AND CORONARY ANGIOGRAPHY  N/A 12/05/2018   Procedure: LEFT HEART CATH AND CORONARY ANGIOGRAPHY with possible pci and stent;  Surgeon: Yolonda Kida, MD;  Location: [facility];  Service: Cardiovascular;  Laterality:  N/A;    FAMILY HISTORY: family history includes Cirrhosis in his brother; Diabetes in his father and mother; Hypertension in his father; Pancreatic cancer in his father; Pulmonary embolism in his mother; Transient ischemic attack in his mother.  SOCIAL HISTORY:  reports that he quit smoking about 37 years ago. His smoking use included cigarettes. He has a 30.00 pack-year smoking history. His smokeless tobacco use includes chew. He reports that he does not drink alcohol and does not use drugs.  ALLERGIES: Codeine  MEDICATIONS:  Current Outpatient Medications  Medication Sig Dispense Refill   amLODipine (NORVASC) 10 MG tablet Take 10 mg by mouth daily.     aspirin EC 81 MG tablet Take 1 tablet (81 mg total) by mouth daily. Swallow whole. 30 tablet 12   atorvastatin (LIPITOR) 80 MG tablet TAKE 1 TABLET BY MOUTH EVERY NIGHT AT BEDTIME 90 tablet 3   clopidogrel (PLAVIX) 75 MG tablet Take 75 mg by mouth daily.     co-enzyme Q-10 30 MG capsule Take 30 mg by mouth daily.      diclofenac sodium (VOLTAREN) 1 % GEL Apply 2 g topically 4 (four) times daily. 100 g 3   Krill Oil 1000 MG CAPS Take 1,000 mg by mouth daily.      metFORMIN (GLUCOPHAGE XR) 750 MG 24 hr tablet Take 1 tablet (750 mg total) by mouth daily with breakfast. 90 tablet 1   metoprolol tartrate (LOPRESSOR) 25 MG tablet Take 1 tablet (25 mg total) by mouth 2 (two) times daily. 60 tablet 2   MULTIPLE VITAMIN PO Take 1 tablet by mouth daily.     nitroGLYCERIN (NITROSTAT) 0.4 MG SL tablet Place 1 tablet (0.4 mg total) under the tongue every 5 (five) minutes as needed for chest pain. (Patient not taking: Reported on 09/18/2021) 30 tablet 1   OneTouch Delica Lancets 32I MISC TEST FASTING GLUCOSE LEVEL EACH MORNING BEFORE BREAKFAST 100 each 4   pantoprazole (PROTONIX) 40 MG tablet Take 40 mg by mouth daily.     ramipril (ALTACE) 10 MG capsule Take 1 capsule (10 mg total) by mouth daily. 30 capsule 2   tamsulosin (FLOMAX) 0.4 MG CAPS capsule  Take 0.4 mg by mouth daily.     docusate sodium (COLACE) 100 MG capsule Take 1 capsule (100 mg total) by mouth 2 (two) times daily. (Patient not taking: Reported on 09/18/2021) 60 capsule 0   gabapentin (NEURONTIN) 300 MG capsule Take 1 capsule (300 mg total) by mouth 3 (three) times daily. (Patient not taking: Reported on 08/28/2021) 90 capsule 1   Meth-Hyo-M Bl-Na Phos-Ph Sal (URIBEL) 118 MG CAPS Take 1 capsule (118 mg total) by mouth every 6 (six) hours as needed (dysuria). (Patient not taking: Reported on 09/11/2021) 40 capsule 3   ondansetron (ZOFRAN-ODT) 4 MG disintegrating tablet Take 1 tablet (4 mg total) by mouth every 6 (six) hours as needed for nausea or vomiting. (Patient not taking: Reported on 08/28/2021) 20 tablet 0   oxyCODONE-acetaminophen (PERCOCET/ROXICET) 5-325 MG tablet Take 1 tablet by mouth every 4 (four) hours as needed for moderate pain. (Patient not taking: Reported on 09/18/2021) 20 tablet 0   No current facility-administered medications for this encounter.    ECOG PERFORMANCE STATUS:  0 - Asymptomatic  REVIEW OF SYSTEMS: Patient does have COPD emphysema  coronary artery disease adult onset diabetes spinal stenosis. Patient denies any weight loss, fatigue, weakness, fever, chills or night sweats. Patient denies any loss of vision, blurred vision. Patient denies any ringing  of the ears or hearing loss. No irregular heartbeat. Patient denies heart murmur or history of fainting. Patient denies any chest pain or pain radiating to her upper extremities. Patient denies any shortness of breath, difficulty breathing at night, cough or hemoptysis. Patient denies any swelling in the lower legs. Patient denies any nausea vomiting, vomiting of blood, or coffee ground material in the vomitus. Patient denies any stomach pain. Patient states has had normal bowel movements no significant constipation or diarrhea. Patient denies any dysuria, hematuria or significant nocturia. Patient denies any  problems walking, swelling in the joints or loss of balance. Patient denies any skin changes, loss of hair or loss of weight. Patient denies any excessive worrying or anxiety or significant depression. Patient denies any problems with insomnia. Patient denies excessive thirst, polyuria, polydipsia. Patient denies any swollen glands, patient denies easy bruising or easy bleeding. Patient denies any recent infections, allergies or URI. Patient "s visual fields have not changed significantly in recent time. PHYSICAL EXAM: BP 112/69 (BP Location: Left Arm, Patient Position: Sitting, Cuff Size: Large)   Pulse 61   Temp 97.6 F (36.4 C) (Tympanic)   Resp 20   Wt 170 lb 14.4 oz (77.5 kg)   BMI 25.24 kg/m    well-developed well-nourished patient in NAD. HEENT reveals PERLA, EOMI, discs not visualized.  Oral cavity is clear. No oral mucosal lesions are identified. Neck is clear without evidence of cervical or supraclavicular adenopathy. Lungs are clear to A&P. Cardiac examination is essentially unremarkable with regular rate and rhythm without murmur rub or thrill. Abdomen is benign with no organomegaly or masses noted. Motor sensory and DTR levels are equal and symmetric in the upper and lower extremities. Cranial nerves II through XII are grossly intact. Proprioception is intact. No peripheral adenopathy or edema is identified. No motor or sensory levels are noted. Crude visual fields are within normal range.  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  I would choose IMRT treatment planning delivery to spare critical  structures such as normal lung volume esophagus heart and spinal cord.  Risks and benefits of treatment including skin reaction fatigue alteration of blood counts possible slight dysphagia all were discussed in detail with the patient and his wife.  I also will treat with concurrent chemotherapy as the patient is to see Dr. Janese Banks next for that recommendation.  There will be extra effort by both professional staff as well as technical staff to coordinate and manage concurrent chemoradiation and ensuing side effects during his treatments. I have personally set up and ordered CT simulation with motion restriction for dimensional treatment planning early next week.  I would like to take this opportunity to thank you for allowing me to participate in the care of your patient.Noreene Filbert, MD

## 2021-09-18 NOTE — H&P (View-Only) (Signed)
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  He quit smoking about 30 years ago but still chews tobacco.  He presented to the ER with symptoms of neck pain which led to an MRI cervical spine as well as CT angio chest.  MRI cervical spine showed degenerative changes along with moderate spinal stenosis and mass effect between C3-C4 and C4-C5.  Foraminal stenosis at C3-C4 C5-C6-C7 nerve levels.  Nonspecific 10 mm T3 vertebral body lesion indeterminate for bone metastases.  CT angio chest showed a 4.5 x 4.9 x 3.7 cm right upper lobe peripheral mass with probable pleural involvement and potential early invasion into right middle lobe.  Borderline enlarged right hilar lymph nodes measuring up to 1.2 cm.  PET CT scan showed hypermetabolic 4.8 cm right upper lobe mass consistent with primary bronchogenic carcinoma and mild asymmetric FDG activity in the right suprahilar region suspicious for right hilar lymph node metastases.  Patient had a CT-guided right upper lobe lung biopsy which was consistent with squamous cell carcinoma.    Interval history-patient is here with his family his wife and daughter.  Overall he is doing well and denies any specific complaints at this time  ECOG PS-  1 Pain scale- 0 Opioid associated constipation- no  Review of systems- Review of Systems  Constitutional:  Positive for malaise/fatigue. Negative for chills, fever and weight loss.  HENT:  Negative for congestion, ear discharge and nosebleeds.   Eyes:  Negative for blurred vision.  Respiratory:  Negative for cough, hemoptysis, sputum production, shortness of breath and wheezing.   Cardiovascular:  Negative for chest pain, palpitations, orthopnea and claudication.  Gastrointestinal:  Negative for abdominal pain, blood in stool, constipation, diarrhea, heartburn, melena, nausea and vomiting.  Genitourinary:  Negative for dysuria, flank pain, frequency, hematuria and urgency.  Musculoskeletal:  Negative for back pain, joint pain and myalgias.  Skin:  Negative for rash.  Neurological:  Negative for dizziness, tingling, focal weakness, seizures, weakness and headaches.  Endo/Heme/Allergies:  Does not bruise/bleed easily.  Psychiatric/Behavioral:  Negative for depression and suicidal ideas. The patient does not have insomnia.       Allergies  Allergen Reactions   Codeine Nausea Only, Nausea And Vomiting and Other (See Comments)    Other reaction(s): Vomiting     Past Medical History:  Diagnosis Date   Arthritis    OSTEOARTHRITIS   Chronic kidney disease    kidney stones   COPD (chronic obstructive pulmonary disease) (HCC)    Coronary artery disease    2 stents    Diabetes mellitus without complication (HCC)    GERD (gastroesophageal reflux disease)    Hypercholesteremia    Hypertension    Myocardial infarction [facility]) [phone]   Renal colic      Past Surgical History:  Procedure Laterality Date   CARDIAC CATHETERIZATION  2013   2  stents   CAROTID PTA/STENT INTERVENTION Right 09/11/2021   Procedure: CAROTID PTA/STENT INTERVENTION;  Surgeon: Algernon Huxley, MD;  Location: [place] [facility];  Service: Cardiovascular;  Laterality: Right;   CATARACT EXTRACTION, BILATERAL      COLONOSCOPY  04/27/05   COLONOSCOPY WITH PROPOFOL N/A 06/08/2015   Procedure: COLONOSCOPY WITH PROPOFOL;  Surgeon: Robert Bellow, MD;  Location: [facility];  Service: Endoscopy;  Laterality: N/A;   CYSTOSCOPY WITH INSERTION OF UROLIFT N/A 02/11/2020   Procedure: CYSTOSCOPY WITH INSERTION OF UROLIFT;  Surgeon: Royston Cowper, MD;  Location: [facility];  Service: Urology;  Laterality: N/A;   CYSTOSCOPY WITH STENT PLACEMENT Right 09/16/2016   Procedure: CYSTOSCOPY WITH STENT PLACEMENT;  Surgeon: Royston Cowper, MD;  Location: [facility];  Service: Urology;  Laterality: Right;   EXTRACORPOREAL SHOCK WAVE LITHOTRIPSY Left 11/04/2014   has had 2 previous lithotripsies   EXTRACORPOREAL SHOCK WAVE LITHOTRIPSY Left 03/24/2015   Procedure: EXTRACORPOREAL SHOCK WAVE LITHOTRIPSY (ESWL);  Surgeon: Royston Cowper, MD;  Location: [facility];  Service: Urology;  Laterality: Left;   EXTRACORPOREAL SHOCK WAVE LITHOTRIPSY Right 10/04/2016   Procedure: EXTRACORPOREAL SHOCK WAVE LITHOTRIPSY (ESWL);  Surgeon: Royston Cowper, MD;  Location: [facility];  Service: Urology;  Laterality: Right;   LEFT HEART CATH AND CORONARY ANGIOGRAPHY N/A 12/05/2018   Procedure: LEFT HEART CATH AND CORONARY ANGIOGRAPHY with possible pci and stent;  Surgeon: Yolonda Kida, MD;  Location: [place] [facility];  Service: Cardiovascular;  Laterality: N/A;    Social History   Socioeconomic History   Marital status: Married    Spouse name: Not on file   Number of children: 2   Years of education: Not on file   Highest education level: 8th grade  Occupational History   Occupation: retired  Tobacco Use   Smoking status: Former    Packs/day: 1.00    Years: 30.00    Total pack years: 30.00    Types: Cigarettes    Quit date: 03/05/1984    Years since quitting: 37.5   Smokeless tobacco: Current    Types: Chew   Tobacco comments:    Chews tobacco daily.  Vaping Use   Vaping Use: Never used  Substance and Sexual Activity   Alcohol  use: No    Alcohol/week: 0.0 standard drinks of alcohol   Drug use: No   Sexual activity: Not on file  Other Topics Concern   Not on file  Social History Narrative   Lives with Zigmund Daniel, wife, and son [place].   Social Determinants of Health   Financial Resource Strain: Low Risk  (04/07/2020)   Overall Financial Resource Strain (CARDIA)    Difficulty of Paying Living Expenses: Not hard at all  Food Insecurity: No Food Insecurity (04/07/2020)   Hunger Vital Sign    Worried About Running Out of Food in the Last Year: Never true    Ran Out of Food in the Last Year: Never true  Transportation Needs: No Transportation Needs (04/07/2020)   PRAPARE - Hydrologist (Medical): No    Lack of Transportation (Non-Medical): No  Physical Activity: Inactive (04/07/2020)   Exercise Vital Sign    Days of Exercise per Week: 0 days    Minutes of Exercise per Session: 0 min  Stress: No Stress Concern Present (04/07/2020)   [place]    Feeling of Stress : Not at all  Social Connections: Moderately Integrated (  04/07/2020)   Social Connection and Isolation Panel [NHANES]    Frequency of Communication with Friends and Family: More than three times a week    Frequency of Social Gatherings with Friends and Family: More than three times a week    Attends Religious Services: More than 4 times per year    Active Member of [employer] or Organizations: No    Attends Archivist Meetings: Never    Marital Status: Married  Human resources officer Violence: Not At Risk (04/07/2020)   Humiliation, Afraid, Rape, and Kick questionnaire    Fear of Current or Ex-Partner: No    Emotionally Abused: No    Physically Abused: No    Sexually Abused: No    Family History  Problem Relation Age of Onset   Pulmonary embolism Mother    Transient ischemic attack Mother    Diabetes Mother    Pancreatic cancer Father    Hypertension Father     Diabetes Father    Cirrhosis Brother      Current Outpatient Medications:    amLODipine (NORVASC) 10 MG tablet, Take 10 mg by mouth daily., Disp: , Rfl:    aspirin EC 81 MG tablet, Take 1 tablet (81 mg total) by mouth daily. Swallow whole., Disp: 30 tablet, Rfl: 12   atorvastatin (LIPITOR) 80 MG tablet, TAKE 1 TABLET BY MOUTH EVERY NIGHT AT BEDTIME, Disp: 90 tablet, Rfl: 3   clopidogrel (PLAVIX) 75 MG tablet, Take 75 mg by mouth daily., Disp: , Rfl:    co-enzyme Q-10 30 MG capsule, Take 30 mg by mouth daily. , Disp: , Rfl:    diclofenac sodium (VOLTAREN) 1 % GEL, Apply 2 g topically 4 (four) times daily., Disp: 100 g, Rfl: 3   Krill Oil 1000 MG CAPS, Take 1,000 mg by mouth daily. , Disp: , Rfl:    metFORMIN (GLUCOPHAGE XR) 750 MG 24 hr tablet, Take 1 tablet (750 mg total) by mouth daily with breakfast., Disp: 90 tablet, Rfl: 1   metoprolol tartrate (LOPRESSOR) 25 MG tablet, Take 1 tablet (25 mg total) by mouth 2 (two) times daily., Disp: 60 tablet, Rfl: 2   MULTIPLE VITAMIN PO, Take 1 tablet by mouth daily., Disp: , Rfl:    OneTouch Delica Lancets 30Q MISC, TEST FASTING GLUCOSE LEVEL EACH MORNING BEFORE BREAKFAST, Disp: 100 each, Rfl: 4   pantoprazole (PROTONIX) 40 MG tablet, Take 40 mg by mouth daily., Disp: , Rfl:    ramipril (ALTACE) 10 MG capsule, Take 1 capsule (10 mg total) by mouth daily., Disp: 30 capsule, Rfl: 2   tamsulosin (FLOMAX) 0.4 MG CAPS capsule, Take 0.4 mg by mouth daily., Disp: , Rfl:    dexamethasone (DECADRON) 4 MG tablet, Take 2 tablets (8 mg total) by mouth daily. Start the day after chemotherapy for 2 days., Disp: 30 tablet, Rfl: 1   docusate sodium (COLACE) 100 MG capsule, Take 1 capsule (100 mg total) by mouth 2 (two) times daily. (Patient not taking: Reported on 09/18/2021), Disp: 60 capsule, Rfl: 0   gabapentin (NEURONTIN) 300 MG capsule, Take 1 capsule (300 mg total) by mouth 3 (three) times daily. (Patient not taking: Reported on 08/28/2021), Disp: 90 capsule, Rfl:  1   lidocaine-prilocaine (EMLA) cream, Apply to affected area once, Disp: 30 g, Rfl: 3   Meth-Hyo-M Bl-Na Phos-Ph Sal (URIBEL) 118 MG CAPS, Take 1 capsule (118 mg total) by mouth every 6 (six) hours as needed (dysuria). (Patient not taking: Reported on 09/11/2021), Disp: 40 capsule, Rfl: 3  nitroGLYCERIN (NITROSTAT) 0.4 MG SL tablet, Place 1 tablet (0.4 mg total) under the tongue every 5 (five) minutes as needed for chest pain. (Patient not taking: Reported on 09/18/2021), Disp: 30 tablet, Rfl: 1   ondansetron (ZOFRAN) 8 MG tablet, Take 1 tablet (8 mg total) by mouth 2 (two) times daily as needed for refractory nausea / vomiting. Start on day 3 after chemo., Disp: 30 tablet, Rfl: 1   ondansetron (ZOFRAN-ODT) 4 MG disintegrating tablet, Take 1 tablet (4 mg total) by mouth every 6 (six) hours as needed for nausea or vomiting. (Patient not taking: Reported on 08/28/2021), Disp: 20 tablet, Rfl: 0   oxyCODONE-acetaminophen (PERCOCET/ROXICET) 5-325 MG tablet, Take 1 tablet by mouth every 4 (four) hours as needed for moderate pain. (Patient not taking: Reported on 09/18/2021), Disp: 20 tablet, Rfl: 0   prochlorperazine (COMPAZINE) 10 MG tablet, Take 1 tablet (10 mg total) by mouth every 6 (six) hours as needed (Nausea or vomiting)., Disp: 30 tablet, Rfl: 1  Physical exam:  Vitals:   09/18/21 1122  BP: 112/69  Pulse: 61  Resp: 20  Temp: 97.6 F (36.4 C)  Weight: 170 lb 14.4 oz (77.5 kg)   Physical Exam Constitutional:      General: He is not in acute distress. Cardiovascular:     Rate and Rhythm: Normal rate and regular rhythm.     Heart sounds: Normal heart sounds.  Pulmonary:     Effort: Pulmonary effort is normal.     Breath sounds: Normal breath sounds.  Abdominal:     General: Bowel sounds are normal.     Palpations: Abdomen is soft.  Skin:    General: Skin is warm and dry.  Neurological:     Mental Status: He is alert and oriented to person, place, and time.         Latest Ref Rng &  Units 09/12/2021    4:32 AM  CMP  Glucose 70 - 99 mg/dL 100   BUN 8 - 23 mg/dL 18   Creatinine 0.61 - 1.24 mg/dL 1.13   Sodium 135 - 145 mmol/L 137   Potassium 3.5 - 5.1 mmol/L 4.5   Chloride 98 - 111 mmol/L 109   CO2 22 - 32 mmol/L 23   Calcium 8.9 - 10.3 mg/dL 8.2       Latest Ref Rng & Units 09/12/2021    4:32 AM  CBC  WBC 4.0 - 10.5 K/uL 6.9   Hemoglobin 13.0 - 17.0 g/dL 10.2   Hematocrit 39.0 - 52.0 % 31.8   Platelets 150 - 400 K/uL 191       PERIPHERAL VASCULAR CATHETERIZATION  Result Date: 09/11/2021 See surgical note for result.  DG Chest Port 1 View  Result Date: 09/08/2021 CLINICAL DATA:  76 year old male status post right lung biopsy EXAM: PORTABLE CHEST 1 VIEW COMPARISON:  09/08/2021 FINDINGS: Cardiomediastinal silhouette unchanged in size and contour. Opacity in the right upper lobe, with known FDG avid mass. No pneumothorax or pleural effusion.  Low lung volumes. Coarsened interstitial markings throughout. IMPRESSION: Negative for pneumothorax status post right lung mass biopsy Electronically Signed   By: Corrie Mckusick D.O.   On: 09/08/2021 12:38   CT LUNG MASS BIOPSY  Result Date: 09/08/2021 INDICATION: 76 year old male with right upper lobe mass referred for biopsy EXAM: CT BIOPSY CORE LUNG/MEDIASTINUM MEDICATIONS: None. ANESTHESIA/SEDATION: Moderate (conscious) sedation was employed during this procedure. A total of Versed 2.0 mg and Fentanyl 75 mcg was administered intravenously. Moderate Sedation Time: 14  minutes. The patient's level of consciousness and vital signs were monitored continuously by radiology nursing throughout the procedure under my direct supervision. FLUOROSCOPY TIME:  CT COMPLICATIONS: None PROCEDURE: The procedure, risks, benefits, and alternatives were explained to the patient and the patient's family. Specific risks that were addressed included bleeding, infection, pneumothorax, need for further procedure including chest tube placement, chance of  delayed pneumothorax or hemorrhage, hemoptysis, nondiagnostic sample, cardiopulmonary collapse, death. Questions regarding the procedure were encouraged and answered. The patient understands and consents to the procedure. Patient was positioned in the supine position on the CT gantry table and a scout CT of the chest was performed for planning purposes. Once angle of approach was determined, the skin and subcutaneous tissues this scan was prepped and draped in the usual sterile fashion, and a sterile drape was applied covering the operative field. A sterile gown and sterile gloves were used for the procedure. Local anesthesia was provided with 1% Lidocaine. The skin and subcutaneous tissues were infiltrated 1% lidocaine for local anesthesia, and a small stab incision was made with an 11 blade scalpel. Using CT guidance, a 17 gauge trocar needle was advanced into the right upper lobe pleural basedtarget. After confirmation of the tip, separate 18 gauge core biopsies were performed. These were placed into solution for transportation to the lab. A final CT image was performed. Patient tolerated the procedure well and remained hemodynamically stable throughout. No complications were encountered and no significant blood loss was encounter FINDINGS: Small pneumothorax at the completion, with no pulmonary hemorrhage IMPRESSION: Status post CT-guided biopsy of right upper lobe pleural base target. Signed, Dulcy Fanny. Nadene Rubins, RPVI [facility] Electronically Signed   By: Corrie Mckusick D.O.   On: 09/08/2021 11:54   MR Brain W Wo Contrast  Result Date: 09/01/2021 CLINICAL DATA:  Lung mass, staging EXAM: MRI HEAD WITHOUT AND WITH CONTRAST TECHNIQUE: Multiplanar, multiecho pulse sequences of the brain and surrounding structures were obtained without and with intravenous contrast. CONTRAST:  7.34mL GADAVIST GADOBUTROL 1 MMOL/ML IV SOLN COMPARISON:  CT head  08/18/2018 FINDINGS: Brain: There is no acute intracranial hemorrhage, extra-axial fluid collection, or acute infarct. Parenchymal volume is normal for age. The ventricles are normal in size. There is no significant burden of chronic white matter disease. Gray-white differentiation is preserved. There is no abnormal enhancement. There is no suspicious mass lesion. There is no mass effect or midline shift. Vascular: Normal flow voids. Skull and upper cervical spine: Normal marrow signal. Spinal stenosis at C3-C4 is again seen common better evaluated on recent cervical spine MRI. Sinuses/Orbits: There is mild mucosal thickening in the paranasal sinuses. Bilateral lens implants are in place. The globes and orbits are otherwise unremarkable. Other: None. IMPRESSION: No evidence of intracranial metastatic disease. Electronically Signed   By: Valetta Mole M.D.   On: 09/01/2021 11:38   NM PET Image Initial (PI) Skull Base To Thigh  Result Date: 08/30/2021 CLINICAL DATA:  Initial treatment strategy for right lung mass. EXAM: NUCLEAR MEDICINE PET SKULL BASE TO THIGH TECHNIQUE: 10.5 mCi F-18 FDG was injected intravenously. Full-ring PET imaging was performed from the skull base to thigh after the radiotracer. CT data was obtained and used for attenuation correction and anatomic localization. Fasting blood glucose: 105 mg/dl COMPARISON:  Chest CTA on 08/17/2021 FINDINGS: Mediastinal blood-pool activity (background): SUV max = 2.0 Liver activity (reference): SUV max = N/A NECK:  No hypermetabolic lymph nodes or masses. Incidental CT findings:  None.  CHEST: Macrolobulated mass in the lateral right upper lobe measures 4.8 x 4.6 cm and shows marked hypermetabolism, with SUV max of 16.3. No other suspicious pulmonary nodules or masses are seen on CT images. Mild centrilobular emphysema noted. No evidence of pleural effusion. Mild asymmetric FDG activity is seen in the right suprahilar region with SUV max of 3.1. No  hypermetabolic mediastinal or axillary lymph nodes are identified. Incidental CT findings:  None. ABDOMEN/PELVIS: No abnormal hypermetabolic activity within the liver, pancreas, adrenal glands, or spleen. No hypermetabolic lymph nodes in the abdomen or pelvis. Incidental CT findings: Right upper pole renal cyst. Left-sided colonic diverticulosis, without evidence of diverticulitis. Mild to moderately enlarged prostate. Aortic atherosclerotic calcification incidentally noted. SKELETON: No focal hypermetabolic bone lesions to suggest skeletal metastasis. Incidental CT findings:  None. IMPRESSION: Hypermetabolic 4.8 cm right upper lobe mass, consistent with primary bronchogenic carcinoma. Mild asymmetric FDG activity in the right suprahilar region, suspicious for right hilar lymph node metastasis. No evidence of distant metastatic disease. Aortic Atherosclerosis (ICD10-I70.0) and Emphysema (ICD10-J43.9). Electronically Signed   By: Marlaine Hind M.D.   On: 08/30/2021 09:38     Assessment and plan- Patient is a 76 y.o. male with newly diagnosed squamous cell carcinoma of the right upper lobe stage II BC T2N 1M0  Discussed with the patient that the right upper lobe lung biopsy was consistent with squamous cell carcinoma.  CT-guided biopsy was deemed to be safe for his age and therefore his hilar lymph node was not sampled with appears suspicious on PET scan and for now is presumed to be positive.  At this time I would recommend concurrent chemoradiation with weekly CarboTaxol.  Carboplatin given at AUC 2 along with Taxol at 45 mg per metered square.  Discussed risks and benefits of chemotherapy including all but not limited to nausea, vomiting, low blood counts, risk of infections and hospitalizations.  Risk of peripheral neuropathy and infusion reaction associated with both carboplatin and Taxol.  Treatment will be given with a curative intent.  Patient understands and agrees to proceed as planned.  He will need  port placement and chemotherapy teach prior to start of chemotherapy and I will tentatively see him in 2 weeks to start cycle 1 of treatment.  Treatment will be given with a curative intent  If he has good response to treatment after concurrent chemoradiation I will consider maintenance immunotherapy for 1 year following that.     Cancer Staging  Malignant neoplasm of upper lobe of right lung [facility]) Staging form: Lung, AJCC 8th Edition - Clinical stage from 09/18/2021: Stage IIB (cT2, cN1, cM0) - Signed by Sindy Guadeloupe, MD on 09/18/2021 Histopathologic type: Squamous cell carcinoma, NOS   Visit Diagnosis 1. Malignant neoplasm of upper lobe of right lung ([place])   2. Goals of care, counseling/discussion      Dr. Randa Evens, MD, MPH [facility] at [facility] [phone] 09/18/2021 4:43 PM

## 2021-09-18 NOTE — Progress Notes (Signed)
Met with patient and his family during follow up with visit to discuss biopsy results and treatment options. All questions answered during visit. Reviewed upcoming appts. Informed that he will be called with his appt for port placement once scheduled. Pt requests that port be placed by Dr. Lucky Cowboy. Message sent to his staff to help coordinate port placement prior to chemo starting. Instructed pt to call with any questions or needs. Pt verbalized understanding. Nothing further needed at this time.

## 2021-09-19 NOTE — Addendum Note (Signed)
Addended by: Telford Nab on: 09/19/2021 10:47 AM   Modules accepted: Orders

## 2021-09-21 ENCOUNTER — Telehealth (INDEPENDENT_AMBULATORY_CARE_PROVIDER_SITE_OTHER): Payer: Self-pay

## 2021-09-21 NOTE — Telephone Encounter (Addendum)
Spoke with the patient and he is scheduled with Dr. Lucky Cowboy for a port placement on 09/25/21 with a 8:15 am arrival time to the [facility]. Pre-procedure instructions were discussed and will be mailed. Spoke with the patient and gave him the new arrival time of 10:45 am for his port placement with Dr. Lucky Cowboy on 09/25/21 at the [facility].

## 2021-09-22 ENCOUNTER — Inpatient Hospital Stay: Payer: Medicare Other

## 2021-09-25 ENCOUNTER — Ambulatory Visit
Admission: RE | Admit: 2021-09-25 | Discharge: 2021-09-25 | Disposition: A | Discharge status: Home / Self Care | Payer: Medicare Other | Source: Ambulatory Visit | Attending: Vascular Surgery | Admitting: Vascular Surgery

## 2021-09-25 ENCOUNTER — Encounter: Payer: Self-pay | Admitting: Vascular Surgery

## 2021-09-25 ENCOUNTER — Encounter
Admission: RE | Disposition: A | Discharge status: Home / Self Care | Payer: Self-pay | Source: Ambulatory Visit | Attending: Vascular Surgery

## 2021-09-25 DIAGNOSIS — C3411 Malignant neoplasm of upper lobe, right bronchus or lung: Secondary | ICD-10-CM | POA: Insufficient documentation

## 2021-09-25 DIAGNOSIS — J449 Chronic obstructive pulmonary disease, unspecified: Secondary | ICD-10-CM | POA: Diagnosis not present

## 2021-09-25 DIAGNOSIS — F17290 Nicotine dependence, other tobacco product, uncomplicated: Secondary | ICD-10-CM | POA: Diagnosis not present

## 2021-09-25 DIAGNOSIS — C7801 Secondary malignant neoplasm of right lung: Secondary | ICD-10-CM

## 2021-09-25 DIAGNOSIS — E119 Type 2 diabetes mellitus without complications: Secondary | ICD-10-CM | POA: Insufficient documentation

## 2021-09-25 HISTORY — PX: PORTA CATH INSERTION: CATH118285

## 2021-09-25 SURGERY — PORTA CATH INSERTION
Anesthesia: Moderate Sedation | Laterality: Right

## 2021-09-25 MED ORDER — SODIUM CHLORIDE 0.9 % IV SOLN
80.0000 mg | Freq: Once | INTRAVENOUS | Status: DC
  Filled 2021-09-25: qty 2

## 2021-09-25 MED ORDER — MIDAZOLAM HCL 2 MG/ML PO SYRP: 8.0000 mg | ORAL_SOLUTION | Freq: Once | ORAL | Status: DC | PRN

## 2021-09-25 MED ORDER — CEFAZOLIN SODIUM-DEXTROSE 2-4 GM/100ML-% IV SOLN: 2.0000 g | INTRAVENOUS | Status: AC

## 2021-09-25 MED ORDER — METHYLPREDNISOLONE SODIUM SUCC 125 MG IJ SOLR: 125.0000 mg | Freq: Once | INTRAMUSCULAR | Status: DC | PRN

## 2021-09-25 MED ORDER — DIPHENHYDRAMINE HCL 50 MG/ML IJ SOLN: 50.0000 mg | Freq: Once | INTRAMUSCULAR | Status: DC | PRN

## 2021-09-25 MED ORDER — ONDANSETRON HCL 4 MG/2ML IJ SOLN: 4.0000 mg | Freq: Four times a day (QID) | INTRAMUSCULAR | Status: DC | PRN

## 2021-09-25 MED ORDER — HYDROMORPHONE HCL 1 MG/ML IJ SOLN: 1.0000 mg | Freq: Once | INTRAMUSCULAR | Status: DC | PRN

## 2021-09-25 MED ORDER — SODIUM CHLORIDE 0.9 % IV SOLN
INTRAVENOUS | Status: DC
Start: 2021-09-25 — End: 2021-09-25

## 2021-09-25 MED ORDER — MIDAZOLAM HCL 2 MG/2ML IJ SOLN
INTRAMUSCULAR | Status: DC | PRN
  Administered 2021-09-25: 1 mg via INTRAVENOUS

## 2021-09-25 MED ORDER — FENTANYL CITRATE (PF) 100 MCG/2ML IJ SOLN
INTRAMUSCULAR | Status: AC
  Filled 2021-09-25: qty 2

## 2021-09-25 MED ORDER — FENTANYL CITRATE (PF) 100 MCG/2ML IJ SOLN
INTRAMUSCULAR | Status: DC | PRN
  Administered 2021-09-25: 50 ug via INTRAVENOUS

## 2021-09-25 MED ORDER — MIDAZOLAM HCL 5 MG/5ML IJ SOLN
INTRAMUSCULAR | Status: AC
  Filled 2021-09-25: qty 5

## 2021-09-25 MED ORDER — FAMOTIDINE 20 MG PO TABS: 40.0000 mg | ORAL_TABLET | Freq: Once | ORAL | Status: DC | PRN

## 2021-09-25 MED ORDER — CEFAZOLIN SODIUM-DEXTROSE 2-4 GM/100ML-% IV SOLN
INTRAVENOUS | Status: AC
  Administered 2021-09-25: 2 g via INTRAVENOUS
  Filled 2021-09-25: qty 100

## 2021-09-25 SURGICAL SUPPLY — 13 items
COVER PROBE U/S 5X48 (MISCELLANEOUS) ×1 IMPLANT
COVER SURGICAL LIGHT HANDLE (MISCELLANEOUS) ×1 IMPLANT
DERMABOND ADVANCED (GAUZE/BANDAGES/DRESSINGS) ×1
DERMABOND ADVANCED .7 DNX12 (GAUZE/BANDAGES/DRESSINGS) IMPLANT
HANDLE YANKAUER SUCT BULB TIP (MISCELLANEOUS) ×1 IMPLANT
KIT PORT POWER 8FR ISP CVUE (Port) ×1 IMPLANT
PACK ANGIOGRAPHY (CUSTOM PROCEDURE TRAY) ×2 IMPLANT
PENCIL ELECTRO HAND CTR (MISCELLANEOUS) ×1 IMPLANT
SPONGE XRAY 4X4 16PLY STRL (MISCELLANEOUS) ×1 IMPLANT
SUT MNCRL AB 4-0 PS2 18 (SUTURE) ×1 IMPLANT
SUT VIC AB 3-0 SH 27 (SUTURE) ×1
SUT VIC AB 3-0 SH 27X BRD (SUTURE) IMPLANT
TUBING CONNECTING 10 (TUBING) ×1 IMPLANT

## 2021-09-25 NOTE — Progress Notes (Signed)
Pt eating and drinking, fam at bedside

## 2021-09-25 NOTE — Op Note (Signed)
      [facility]       Operative Note  Date: 09/25/2021  Preoperative diagnosis:  1. Lung cancer  Postoperative diagnosis:  Same as above  Procedures: #1. Ultrasound guidance for vascular access to the right internal jugular vein. #2. Fluoroscopic guidance for placement of catheter. #3. Placement of CT compatible Port-A-Cath, right internal jugular vein.  Surgeon: Leotis Pain, MD.   Anesthesia: Local with moderate conscious sedation for approximately 22  minutes using 1 mg of Versed and 50 mcg of Fentanyl  Fluoroscopy time: less than 1 minute  Contrast used: 0  Estimated blood loss: 5 cc  Indication for the procedure:  The patient is a 76 y.o.male with lung cancer.  The patient needs a Port-A-Cath for durable venous access, chemotherapy, lab draws, and CT scans. We are asked to place this. Risks and benefits were discussed and informed consent was obtained.  Description of procedure: The patient was brought to the vascular and interventional radiology suite.  Moderate conscious sedation was administered throughout the procedure during a face to face encounter with the patient with my supervision of the RN administering medicines and monitoring the patient's vital signs, pulse oximetry, telemetry and mental status throughout from the start of the procedure until the patient was taken to the recovery room. The right neck chest and shoulder were sterilely prepped and draped, and a sterile surgical field was created. Ultrasound was used to help visualize a patent right internal jugular vein. This was then accessed under direct ultrasound guidance without difficulty with the Seldinger needle and a permanent image was recorded. A J-wire was placed. After skin nick and dilatation, the peel-away sheath was then placed over the wire. I then anesthetized an area under the clavicle approximately 1-2 fingerbreadths. A transverse incision was created and an inferior pocket was  created with electrocautery and blunt dissection. The port was then brought onto the field, placed into the pocket and secured to the chest wall with 2 Prolene sutures. The catheter was connected to the port and tunneled from the subclavicular incision to the access site. Fluoroscopic guidance was then used to cut the catheter to an appropriate length. The catheter was then placed through the peel-away sheath and the peel-away sheath was removed. The catheter tip was parked in excellent location under fluorocoscopic guidance in the cavoatrial junction. The pocket was then irrigated with antibiotic impregnated saline and the wound was closed with a running 3-0 Vicryl and a 4-0 Monocryl. The access incision was closed with a single 4-0 Monocryl. The Huber needle was used to withdraw blood and flush the port with heparinized saline. Dermabond was then placed as a dressing. The patient tolerated the procedure well and was taken to the recovery room in stable condition.   Leotis Pain 09/25/2021 12:22 PM   This note was created with Dragon Medical transcription system. Any errors in dictation are purely unintentional.

## 2021-09-25 NOTE — Interval H&P Note (Signed)
History and Physical Interval Note:  09/25/2021 11:55 AM  Luke Rojas  has presented today for surgery, with the diagnosis of Porta Cath Placement   Malignant neoplasm R lung.  The various methods of treatment have been discussed with the patient and family. After consideration of risks, benefits and other options for treatment, the patient has consented to  Procedure(s): PORTA CATH INSERTION (Right) as a surgical intervention.  The patient's history has been reviewed, patient examined, no change in status, stable for surgery.  I have reviewed the patient's chart and labs.  Questions were answered to the patient's satisfaction.     Leotis Pain

## 2021-09-26 ENCOUNTER — Ambulatory Visit
Admission: RE | Admit: 2021-09-26 | Discharge: 2021-09-26 | Disposition: A | Discharge status: Home / Self Care | Payer: Medicare Other | Source: Ambulatory Visit | Attending: Radiation Oncology | Admitting: Radiation Oncology

## 2021-09-26 DIAGNOSIS — C3411 Malignant neoplasm of upper lobe, right bronchus or lung: Secondary | ICD-10-CM | POA: Diagnosis not present

## 2021-09-26 DIAGNOSIS — Z87891 Personal history of nicotine dependence: Secondary | ICD-10-CM | POA: Diagnosis not present

## 2021-09-27 NOTE — Progress Notes (Unsigned)
Referring Physician:  Ward, Delice Bison, DO [place],  [place] [address]  Primary Physician:  Virginia Crews, MD  History of Present Illness: 09/27/2021 [name] is here today with a chief complaint of neck pain without radiation into the arms.  He is usually better with Tylenol.  This is bothering him and made worse by movement.  Unfortunately, he recently was diagnosed with lung cancer.  He is starting treatment next week.  He will have chemotherapy and radiation.   Bowel/Bladder Dysfunction: none  Conservative measures:  Physical therapy:  has not participated in Multimodal medical therapy including regular antiinflammatories:  gabapentin, oxycodone, tylenol Injections:  has not had epidural steroid injections  Past Surgery: denies  ELL TISO has no symptoms of cervical myelopathy.  The symptoms are causing a significant impact on the patient's life.   Review of Systems:  A 10 point review of systems is negative, except for the pertinent positives and negatives detailed in the HPI.  Past Medical History: Past Medical History:  Diagnosis Date   Arthritis    OSTEOARTHRITIS   Chronic kidney disease    kidney stones   COPD (chronic obstructive pulmonary disease) (HCC)    Coronary artery disease    2 stents    Diabetes mellitus without complication (HCC)    GERD (gastroesophageal reflux disease)    Hypercholesteremia    Hypertension    Myocardial infarction [facility]) 2725   Renal colic     Past Surgical History: Past Surgical History:  Procedure Laterality Date   CARDIAC CATHETERIZATION  2013   2 stents   CAROTID PTA/STENT INTERVENTION Right 09/11/2021   Procedure: CAROTID PTA/STENT INTERVENTION;  Surgeon: Algernon Huxley, MD;  Location: [facility];  Service: Cardiovascular;  Laterality: Right;   CATARACT EXTRACTION, BILATERAL     COLONOSCOPY  04/27/05   COLONOSCOPY WITH PROPOFOL N/A 06/08/2015   Procedure:  COLONOSCOPY WITH PROPOFOL;  Surgeon: Robert Bellow, MD;  Location: [facility];  Service: Endoscopy;  Laterality: N/A;   CYSTOSCOPY WITH INSERTION OF UROLIFT N/A 02/11/2020   Procedure: CYSTOSCOPY WITH INSERTION OF UROLIFT;  Surgeon: Royston Cowper, MD;  Location: [facility];  Service: Urology;  Laterality: N/A;   CYSTOSCOPY WITH STENT PLACEMENT Right 09/16/2016   Procedure: CYSTOSCOPY WITH STENT PLACEMENT;  Surgeon: Royston Cowper, MD;  Location: [facility];  Service: Urology;  Laterality: Right;   EXTRACORPOREAL SHOCK WAVE LITHOTRIPSY Left 11/04/2014   has had 2 previous lithotripsies   EXTRACORPOREAL SHOCK WAVE LITHOTRIPSY Left 03/24/2015   Procedure: EXTRACORPOREAL SHOCK WAVE LITHOTRIPSY (ESWL);  Surgeon: Royston Cowper, MD;  Location: [facility];  Service: Urology;  Laterality: Left;   EXTRACORPOREAL SHOCK WAVE LITHOTRIPSY Right 10/04/2016   Procedure: EXTRACORPOREAL SHOCK WAVE LITHOTRIPSY (ESWL);  Surgeon: Royston Cowper, MD;  Location: [facility];  Service: Urology;  Laterality: Right;   LEFT HEART CATH AND CORONARY ANGIOGRAPHY N/A 12/05/2018   Procedure: LEFT HEART CATH AND CORONARY ANGIOGRAPHY with possible pci and stent;  Surgeon: Yolonda Kida, MD;  Location: [facility];  Service: Cardiovascular;  Laterality: N/A;   PORTA CATH INSERTION Right 09/25/2021   Procedure: PORTA CATH INSERTION;  Surgeon: Algernon Huxley, MD;  Location: [facility];  Service: Cardiovascular;  Laterality: Right;    Allergies: Allergies as of 09/28/2021 - Review Complete 09/25/2021  Allergen Reaction Noted   Codeine Nausea Only, Nausea And Vomiting, and Other (See Comments) 07/17/2013    Medications:  No outpatient medications have been marked as taking for the 09/28/21 encounter (Appointment) with Meade Maw, MD.    Social History: Social History   Tobacco Use   Smoking status: Former    Packs/day: 1.00    Years: 30.00    Total pack years: 30.00    Types: Cigarettes     Quit date: 03/05/1984    Years since quitting: 37.5   Smokeless tobacco: Current    Types: Chew   Tobacco comments:    Chews tobacco daily.  Vaping Use   Vaping Use: Never used  Substance Use Topics   Alcohol use: No    Alcohol/week: 0.0 standard drinks of alcohol   Drug use: No    Family Medical History: Family History  Problem Relation Age of Onset   Pulmonary embolism Mother    Transient ischemic attack Mother    Diabetes Mother    Pancreatic cancer Father    Hypertension Father    Diabetes Father    Cirrhosis Brother     Physical Examination: There were no vitals filed for this visit.  General: Patient is well developed, well nourished, calm, collected, and in no apparent distress. Attention to examination is appropriate.  Neck:   Supple.  Limited rotation on range of motion.  Respiratory: Patient is breathing without any difficulty.   NEUROLOGICAL:     Awake, alert, oriented to person, place, and time.  Speech is clear and fluent. Fund of knowledge is appropriate.   Cranial Nerves: Pupils equal round and reactive to light.  Facial tone is symmetric.  Facial sensation is symmetric. Shoulder shrug is symmetric. Tongue protrusion is midline.  There is no pronator drift.  ROM of spine: full.    Strength: Side Biceps Triceps Deltoid Interossei Grip Wrist Ext. Wrist Flex.  R 5 5 5 5 5 5 5   L 5 5 5 5 5 5 5    Side Iliopsoas Quads Hamstring PF DF EHL  R 5 5 5 5 5 5   L 5 5 5 5 5 5    Reflexes are 1+ and symmetric at the biceps, triceps, brachioradialis, patella and achilles.   Hoffman's is absent.  Clonus is not present.  Toes are down-going.  Bilateral upper and lower extremity sensation is intact to light touch.    No evidence of dysmetria noted.  Gait is normal.    Medical Decision Making  Imaging: MRI C spine 08/17/21 IMPRESSION: 1. Nonspecific 10 mm T3 vertebral body lesion is indeterminate for bone metastasis in light of the right lung findings today.  Recommend attention on PET-CT. No evidence for cervical spine metastasis on this noncontrast exam.   2. 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 Mild spinal stenosis at C5-C6 and C6-C7. But no associated spinal cord signal abnormality.   3. Associated severe neural foraminal stenosis at the right C3, bilateral C4, C5, C6 and C7 nerve levels.     Electronically Signed   By: Genevie Ann M.D.   On: 08/17/2021 06:46  I have personally reviewed the images and agree with the above interpretation.  Assessment and Plan: Mr. Stettner is a pleasant 76 y.o. male with neck pain due to extensive cervical spondylosis and cervical stenosis.  I have recommended physical therapy for this.  He will continue taking Tylenol.  There is an indeterminate lesion in his upper thoracic spine.  This does not appear consistent with a metastatic lesion, but will need close  follow-up with imaging after his chemotherapy and radiation.  I will see him back in 3 months to reevaluate for his neck pain and also to review his posttreatment studies.  If this lesion becomes hypermetabolic on PET studies in the future, radiation will likely be possible for it.   I spent a total of 30 minutes in face-to-face and non-face-to-face activities related to this patient's care today.  Thank you for involving me in the care of this patient.      Yahye Siebert K. Izora Ribas MD, [facility] Neurosurgery

## 2021-09-28 ENCOUNTER — Ambulatory Visit: Payer: Medicare Other | Admitting: Neurosurgery

## 2021-09-28 DIAGNOSIS — C3411 Malignant neoplasm of upper lobe, right bronchus or lung: Secondary | ICD-10-CM | POA: Diagnosis not present

## 2021-09-28 DIAGNOSIS — M4802 Spinal stenosis, cervical region: Secondary | ICD-10-CM

## 2021-09-28 DIAGNOSIS — Z87891 Personal history of nicotine dependence: Secondary | ICD-10-CM | POA: Diagnosis not present

## 2021-09-28 DIAGNOSIS — M542 Cervicalgia: Secondary | ICD-10-CM

## 2021-09-29 MED FILL — Dexamethasone Sodium Phosphate Inj 100 MG/10ML: INTRAMUSCULAR | Qty: 1 | Status: AC

## 2021-10-02 ENCOUNTER — Encounter: Payer: Self-pay | Admitting: *Deleted

## 2021-10-02 ENCOUNTER — Inpatient Hospital Stay: Payer: Medicare Other

## 2021-10-02 ENCOUNTER — Encounter: Payer: Self-pay | Admitting: Oncology

## 2021-10-02 ENCOUNTER — Inpatient Hospital Stay (HOSPITAL_BASED_OUTPATIENT_CLINIC_OR_DEPARTMENT_OTHER): Payer: Medicare Other | Admitting: Oncology

## 2021-10-02 VITALS — BP 114/59 | HR 68

## 2021-10-02 VITALS — BP 137/71 | HR 70 | Temp 97.7°F | Resp 16 | Ht 69.0 in | Wt 169.1 lb

## 2021-10-02 DIAGNOSIS — Z87891 Personal history of nicotine dependence: Secondary | ICD-10-CM | POA: Diagnosis not present

## 2021-10-02 DIAGNOSIS — C3411 Malignant neoplasm of upper lobe, right bronchus or lung: Secondary | ICD-10-CM

## 2021-10-02 DIAGNOSIS — Z5111 Encounter for antineoplastic chemotherapy: Secondary | ICD-10-CM | POA: Diagnosis not present

## 2021-10-02 LAB — CBC WITH DIFFERENTIAL/PLATELET
Abs Immature Granulocytes: 0.02 10*3/uL (ref 0.00–0.07)
Basophils Absolute: 0 10*3/uL (ref 0.0–0.1)
Basophils Relative: 1 %
Eosinophils Absolute: 0.1 10*3/uL (ref 0.0–0.5)
Eosinophils Relative: 2 %
HCT: 36.5 % — ABNORMAL LOW (ref 39.0–52.0)
Hemoglobin: 12 g/dL — ABNORMAL LOW (ref 13.0–17.0)
Immature Granulocytes: 0 %
Lymphocytes Relative: 21 %
Lymphs Abs: 1.5 10*3/uL (ref 0.7–4.0)
MCH: 27.5 pg (ref 26.0–34.0)
MCHC: 32.9 g/dL (ref 30.0–36.0)
MCV: 83.7 fL (ref 80.0–100.0)
Monocytes Absolute: 0.8 10*3/uL (ref 0.1–1.0)
Monocytes Relative: 10 %
Neutro Abs: 4.9 10*3/uL (ref 1.7–7.7)
Neutrophils Relative %: 66 %
Platelets: 243 10*3/uL (ref 150–400)
RBC: 4.36 MIL/uL (ref 4.22–5.81)
RDW: 15.2 % (ref 11.5–15.5)
WBC: 7.4 10*3/uL (ref 4.0–10.5)
nRBC: 0 % (ref 0.0–0.2)

## 2021-10-02 LAB — COMPREHENSIVE METABOLIC PANEL
ALT: 35 U/L (ref 0–44)
AST: 33 U/L (ref 15–41)
Albumin: 3.6 g/dL (ref 3.5–5.0)
Alkaline Phosphatase: 82 U/L (ref 38–126)
Anion gap: 7 (ref 5–15)
BUN: 21 mg/dL (ref 8–23)
CO2: 23 mmol/L (ref 22–32)
Calcium: 8.9 mg/dL (ref 8.9–10.3)
Chloride: 104 mmol/L (ref 98–111)
Creatinine, Ser: 1.23 mg/dL (ref 0.61–1.24)
GFR, Estimated: >60 mL/min (ref >60)
Glucose, Bld: 196 mg/dL — ABNORMAL HIGH (ref 70–99)
Potassium: 3.9 mmol/L (ref 3.5–5.1)
Sodium: 134 mmol/L — ABNORMAL LOW (ref 135–145)
Total Bilirubin: 0.7 mg/dL (ref 0.3–1.2)
Total Protein: 7.3 g/dL (ref 6.5–8.1)

## 2021-10-02 MED ORDER — HEPARIN SOD (PORK) LOCK FLUSH 100 UNIT/ML IV SOLN
500.0000 [IU] | Freq: Once | INTRAVENOUS | Status: AC | PRN
  Administered 2021-10-02: 500 [IU]
  Filled 2021-10-02: qty 5

## 2021-10-02 MED ORDER — PALONOSETRON HCL INJECTION 0.25 MG/5ML
0.2500 mg | Freq: Once | INTRAVENOUS | Status: AC
  Administered 2021-10-02: 0.25 mg via INTRAVENOUS
  Filled 2021-10-02: qty 5

## 2021-10-02 MED ORDER — SODIUM CHLORIDE 0.9 % IV SOLN
10.0000 mg | Freq: Once | INTRAVENOUS | Status: AC
  Administered 2021-10-02: 10 mg via INTRAVENOUS
  Filled 2021-10-02: qty 10

## 2021-10-02 MED ORDER — DIPHENHYDRAMINE HCL 50 MG/ML IJ SOLN
50.0000 mg | Freq: Once | INTRAMUSCULAR | Status: AC
  Administered 2021-10-02: 50 mg via INTRAVENOUS
  Filled 2021-10-02: qty 1

## 2021-10-02 MED ORDER — SODIUM CHLORIDE 0.9 % IV SOLN
Freq: Once | INTRAVENOUS | Status: AC
  Filled 2021-10-02: qty 250

## 2021-10-02 MED ORDER — FAMOTIDINE IN NACL 20-0.9 MG/50ML-% IV SOLN
20.0000 mg | Freq: Once | INTRAVENOUS | Status: AC
  Administered 2021-10-02: 20 mg via INTRAVENOUS
  Filled 2021-10-02: qty 50

## 2021-10-02 MED ORDER — SODIUM CHLORIDE 0.9 % IV SOLN
45.0000 mg/m2 | Freq: Once | INTRAVENOUS | Status: AC
  Administered 2021-10-02: 90 mg via INTRAVENOUS
  Filled 2021-10-02: qty 15

## 2021-10-02 MED ORDER — SODIUM CHLORIDE 0.9 % IV SOLN
162.0000 mg | Freq: Once | INTRAVENOUS | Status: AC
  Administered 2021-10-02: 160 mg via INTRAVENOUS
  Filled 2021-10-02: qty 16

## 2021-10-02 NOTE — Progress Notes (Signed)
Met with patient during infusion. All questions answered during visit. Pt states that he is doing well. Instructed pt to call with any further questions or needs. Will follow up with him next week to assess for any further needs.

## 2021-10-02 NOTE — Patient Instructions (Signed)
[facility]  Discharge Instructions: Thank you for choosing [facility] to provide your oncology and hematology care.  If you have a lab appointment with the Cancer Center, please go directly to the Cancer Center and check in at the registration area.  Wear comfortable clothing and clothing appropriate for easy access to any Portacath or PICC line.   We strive to give you quality time with your provider. You may need to reschedule your appointment if you arrive late (15 or more minutes).  Arriving late affects you and other patients whose appointments are after yours.  Also, if you miss three or more appointments without notifying the office, you may be dismissed from the clinic at the provider's discretion.      For prescription refill requests, have your pharmacy contact our office and allow 72 hours for refills to be completed.       To help prevent nausea and vomiting after your treatment, we encourage you to take your nausea medication as directed.  BELOW ARE SYMPTOMS THAT SHOULD BE REPORTED IMMEDIATELY: *FEVER GREATER THAN 100.4 F (38 C) OR HIGHER *CHILLS OR SWEATING *NAUSEA AND VOMITING THAT IS NOT CONTROLLED WITH YOUR NAUSEA MEDICATION *UNUSUAL SHORTNESS OF BREATH *UNUSUAL BRUISING OR BLEEDING *URINARY PROBLEMS (pain or burning when urinating, or frequent urination) *BOWEL PROBLEMS (unusual diarrhea, constipation, pain near the anus) TENDERNESS IN MOUTH AND THROAT WITH OR WITHOUT PRESENCE OF ULCERS (sore throat, sores in mouth, or a toothache) UNUSUAL RASH, SWELLING OR PAIN  UNUSUAL VAGINAL DISCHARGE OR ITCHING   Items with * indicate a potential emergency and should be followed up as soon as possible or go to the Emergency Department if any problems should occur.  Please show the CHEMOTHERAPY ALERT CARD or IMMUNOTHERAPY ALERT CARD at check-in to the Emergency Department and triage nurse.  Should you have questions after your  visit or need to cancel or reschedule your appointment, please contact [facility]  [phone] and follow the prompts.  Office hours are 8:00 a.m. to 4:30 p.m. Monday - Friday. Please note that voicemails left after 4:00 p.m. may not be returned until the following business day.  We are closed weekends and major holidays. You have access to a nurse at all times for urgent questions. Please call the main number to the clinic [phone] and follow the prompts.  For any non-urgent questions, you may also contact your provider using MyChart. We now offer e-Visits for anyone 18 and older to request care online for non-urgent symptoms. For details visit [email or web address].   Also download the MyChart app! Go to the app store, search "MyChart", open the app, select [facility], and log in with your MyChart username and password.  Masks are optional in the cancer centers. If you would like for your care team to wear a mask while they are taking care of you, please let them know. For doctor visits, patients may have with them one support person who is at least 77 years old. At this time, visitors are not allowed in the infusion area.   

## 2021-10-02 NOTE — Progress Notes (Signed)
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  He quit smoking about 30 years ago but still chews tobacco.  He presented to the ER with symptoms of neck pain which led to an MRI cervical spine as well as CT angio chest.  MRI cervical spine showed degenerative changes along with moderate spinal stenosis and mass effect between C3-C4 and C4-C5.  Foraminal stenosis at C3-C4 C5-C6-C7 nerve levels.  Nonspecific 10 mm T3 vertebral body lesion indeterminate for bone metastases.  CT angio chest showed a 4.5 x 4.9 x 3.7 cm right upper lobe peripheral mass with probable pleural involvement and potential early invasion into right middle lobe.  Borderline enlarged right hilar lymph nodes measuring up to 1.2 cm.   PET CT scan showed hypermetabolic 4.8 cm right upper lobe mass consistent with primary bronchogenic carcinoma and mild asymmetric FDG activity in the right suprahilar region suspicious for right hilar lymph node metastases.  Patient had a CT-guided right upper lobe lung biopsy which was consistent with squamous cell carcinoma.  Interval history-he is doing well overall.  Denies any specific complaints at this time. He has baseline fatigue and exertional sob overall unchanged.    ECOG PS- 2 Pain scale- 0   Review of systems- Review of Systems  Constitutional:  Negative for chills, fever, malaise/fatigue and weight loss.  HENT:  Negative for congestion, ear discharge and nosebleeds.   Eyes:  Negative for blurred vision.  Respiratory:  Negative for cough, hemoptysis, sputum production, shortness of breath and wheezing.   Cardiovascular:  Negative for chest pain, palpitations, orthopnea and claudication.  Gastrointestinal:  Negative for abdominal pain, blood in stool, constipation, diarrhea, heartburn, melena, nausea and vomiting.  Genitourinary:  Negative for dysuria, flank pain, frequency, hematuria and urgency.  Musculoskeletal:  Negative for back pain, joint pain and myalgias.  Skin:  Negative for rash.  Neurological:  Negative for dizziness, tingling, focal weakness, seizures, weakness and headaches.  Endo/Heme/Allergies:  Does not bruise/bleed easily.  Psychiatric/Behavioral:  Negative for depression and suicidal ideas. The patient does not have insomnia.       Allergies  Allergen Reactions   Codeine Nausea Only, Nausea And Vomiting and Other (See Comments)    Other reaction(s): Vomiting     Past Medical History:  Diagnosis Date   Arthritis    OSTEOARTHRITIS   Chronic kidney disease    kidney stones   COPD (chronic obstructive pulmonary disease) (HCC)    Coronary artery disease    2 stents    Diabetes mellitus without complication (HCC)    GERD (gastroesophageal reflux disease)    Hypercholesteremia    Hypertension    Myocardial infarction [facility]) 1884   Renal colic      Past Surgical History:  Procedure Laterality Date   CARDIAC CATHETERIZATION  2013   2 stents   CAROTID  PTA/STENT INTERVENTION Right 09/11/2021   Procedure: CAROTID PTA/STENT INTERVENTION;  Surgeon: Algernon Huxley, MD;  Location: [place] [facility];  Service: Cardiovascular;  Laterality: Right;   CATARACT EXTRACTION, BILATERAL     COLONOSCOPY  04/27/05   COLONOSCOPY WITH  PROPOFOL N/A 06/08/2015   Procedure: COLONOSCOPY WITH PROPOFOL;  Surgeon: Robert Bellow, MD;  Location: [facility] END[facility];  Service: Endoscopy;  Laterality: N/A;   CYSTOSCOPY WITH INSERTION OF UROLIFT N/A 02/11/2020   Procedure: CYSTOSCOPY WITH INSERTION OF UROLIFT;  Surgeon: Royston Cowper, MD;  Location: [facility] ORS;  Service: Urology;  Laterality: N/A;   CYSTOSCOPY WITH STENT PLACEMENT Right 09/16/2016   Procedure: CYSTOSCOPY WITH STENT PLACEMENT;  Surgeon: Royston Cowper, MD;  Location: [facility] ORS;  Service: Urology;  Laterality: Right;   EXTRACORPOREAL SHOCK WAVE LITHOTRIPSY Left 11/04/2014   has had 2 previous lithotripsies   EXTRACORPOREAL SHOCK WAVE LITHOTRIPSY Left 03/24/2015   Procedure: EXTRACORPOREAL SHOCK WAVE LITHOTRIPSY (ESWL);  Surgeon: Royston Cowper, MD;  Location: [facility] ORS;  Service: Urology;  Laterality: Left;   EXTRACORPOREAL SHOCK WAVE LITHOTRIPSY Right 10/04/2016   Procedure: EXTRACORPOREAL SHOCK WAVE LITHOTRIPSY (ESWL);  Surgeon: Royston Cowper, MD;  Location: [facility] OR[facility];  Service: Urology;  Laterality: Right;   LEFT HEART CATH AND CORONARY ANGIOGRAPHY N/A 12/05/2018   Procedure: LEFT HEART CATH AND CORONARY ANGIOGRAPHY with possible pci and stent;  Surgeon: Yolonda Kida, MD;  Location: [place] [facility];  Service: Cardiovascular;  Laterality: N/A;   PORTA CATH INSERTION Right 09/25/2021   Procedure: PORTA CATH INSERTION;  Surgeon: Algernon Huxley, MD;  Location: [place] [facility];  Service: Cardiovascular;  Laterality: Right;    Social History   Socioeconomic History   Marital status: Married    Spouse name: Not on file   Number of children: 2   Years of education: Not on file   Highest education level: 8th grade  Occupational History   Occupation: retired  Tobacco Use   Smoking status: Former    Packs/day: 1.00    Years: 30.00    Total pack years: 30.00    Types: Cigarettes    Quit date: 03/05/1984    Years since quitting: 37.6   Smokeless tobacco: Current     Types: Chew   Tobacco comments:    Chews tobacco daily.  Vaping Use   Vaping Use: Never used  Substance and Sexual Activity   Alcohol use: No    Alcohol/week: 0.0 standard drinks of alcohol   Drug use: No   Sexual activity: Not on file  Other Topics Concern   Not on file  Social History Narrative   Lives with Zigmund Daniel, wife, and son [place].   Social Determinants of Health   Financial Resource Strain: Low Risk  (04/07/2020)   Overall Financial Resource Strain (CARDIA)    Difficulty of Paying Living Expenses: Not hard at all  Food Insecurity: No Food Insecurity (04/07/2020)   Hunger Vital Sign    Worried About Running Out of Food in the Last Year: Never true    Ran Out of Food in the Last Year: Never true  Transportation Needs: No Transportation Needs (04/07/2020)   PRAPARE - Hydrologist (Medical): No    Lack of Transportation (Non-Medical): No  Physical Activity: Inactive (04/07/2020)   Exercise Vital Sign    Days of Exercise per Week: 0 days    Minutes of Exercise per Session: 0 min  Stress: No Stress Concern Present (  04/07/2020)   [place]    Feeling of Stress : Not at all  Social Connections: Moderately Integrated (04/07/2020)   Social Connection and Isolation Panel [NHANES]    Frequency of Communication with Friends and Family: More than three times a week    Frequency of Social Gatherings with Friends and Family: More than three times a week    Attends Religious Services: More than 4 times per year    Active Member of [employer] or Organizations: No    Attends Archivist Meetings: Never    Marital Status: Married  Human resources officer Violence: Not At Risk (04/07/2020)   Humiliation, Afraid, Rape, and Kick questionnaire    Fear of Current or Ex-Partner: No    Emotionally Abused: No    Physically Abused: No    Sexually Abused: No    Family History  Problem Relation Age of Onset    Pulmonary embolism Mother    Transient ischemic attack Mother    Diabetes Mother    Pancreatic cancer Father    Hypertension Father    Diabetes Father    Cirrhosis Brother      Current Outpatient Medications:    amLODipine (NORVASC) 10 MG tablet, Take 10 mg by mouth daily., Disp: , Rfl:    aspirin EC 81 MG tablet, Take 1 tablet (81 mg total) by mouth daily. Swallow whole., Disp: 30 tablet, Rfl: 12   atorvastatin (LIPITOR) 80 MG tablet, TAKE 1 TABLET BY MOUTH EVERY NIGHT AT BEDTIME, Disp: 90 tablet, Rfl: 3   clopidogrel (PLAVIX) 75 MG tablet, Take 75 mg by mouth daily., Disp: , Rfl:    co-enzyme Q-10 30 MG capsule, Take 30 mg by mouth daily. , Disp: , Rfl:    dexamethasone (DECADRON) 4 MG tablet, Take 2 tablets (8 mg total) by mouth daily. Start the day after chemotherapy for 2 days., Disp: 30 tablet, Rfl: 1   diclofenac sodium (VOLTAREN) 1 % GEL, Apply 2 g topically 4 (four) times daily., Disp: 100 g, Rfl: 3   docusate sodium (COLACE) 100 MG capsule, Take 1 capsule (100 mg total) by mouth 2 (two) times daily. (Patient not taking: Reported on 09/18/2021), Disp: 60 capsule, Rfl: 0   gabapentin (NEURONTIN) 300 MG capsule, Take 1 capsule (300 mg total) by mouth 3 (three) times daily. (Patient not taking: Reported on 08/28/2021), Disp: 90 capsule, Rfl: 1   Krill Oil 1000 MG CAPS, Take 1,000 mg by mouth daily. , Disp: , Rfl:    lidocaine-prilocaine (EMLA) cream, Apply to affected area once, Disp: 30 g, Rfl: 3   metFORMIN (GLUCOPHAGE XR) 750 MG 24 hr tablet, Take 1 tablet (750 mg total) by mouth daily with breakfast., Disp: 90 tablet, Rfl: 1   Meth-Hyo-M Bl-Na Phos-Ph Sal (URIBEL) 118 MG CAPS, Take 1 capsule (118 mg total) by mouth every 6 (six) hours as needed (dysuria). (Patient not taking: Reported on 09/11/2021), Disp: 40 capsule, Rfl: 3   metoprolol tartrate (LOPRESSOR) 25 MG tablet, Take 1 tablet (25 mg total) by mouth 2 (two) times daily., Disp: 60 tablet, Rfl: 2   MULTIPLE VITAMIN PO, Take 1  tablet by mouth daily., Disp: , Rfl:    nitroGLYCERIN (NITROSTAT) 0.4 MG SL tablet, Place 1 tablet (0.4 mg total) under the tongue every 5 (five) minutes as needed for chest pain. (Patient not taking: Reported on 09/18/2021), Disp: 30 tablet, Rfl: 1   ondansetron (ZOFRAN) 8 MG tablet, Take 1 tablet (8 mg  total) by mouth 2 (two) times daily as needed for refractory nausea / vomiting. Start on day 3 after chemo., Disp: 30 tablet, Rfl: 1   ondansetron (ZOFRAN-ODT) 4 MG disintegrating tablet, Take 1 tablet (4 mg total) by mouth every 6 (six) hours as needed for nausea or vomiting. (Patient not taking: Reported on 08/28/2021), Disp: 20 tablet, Rfl: 0   OneTouch Delica Lancets 00X MISC, TEST FASTING GLUCOSE LEVEL EACH MORNING BEFORE BREAKFAST, Disp: 100 each, Rfl: 4   oxyCODONE-acetaminophen (PERCOCET/ROXICET) 5-325 MG tablet, Take 1 tablet by mouth every 4 (four) hours as needed for moderate pain. (Patient not taking: Reported on 09/18/2021), Disp: 20 tablet, Rfl: 0   pantoprazole (PROTONIX) 40 MG tablet, Take 40 mg by mouth daily., Disp: , Rfl:    prochlorperazine (COMPAZINE) 10 MG tablet, Take 1 tablet (10 mg total) by mouth every 6 (six) hours as needed (Nausea or vomiting)., Disp: 30 tablet, Rfl: 1   ramipril (ALTACE) 10 MG capsule, Take 1 capsule (10 mg total) by mouth daily., Disp: 30 capsule, Rfl: 2   tamsulosin (FLOMAX) 0.4 MG CAPS capsule, Take 0.4 mg by mouth daily., Disp: , Rfl:   Physical exam:  Vitals:   10/02/21 0853  BP: 137/71  Pulse: 70  Resp: 16  Temp: 97.7 F (36.5 C)  TempSrc: Oral  Weight: 169 lb 1.6 oz (76.7 kg)  Height: 5\' 9"  (1.753 m)   Physical Exam Cardiovascular:     Rate and Rhythm: Normal rate and regular rhythm.     Heart sounds: Normal heart sounds.  Pulmonary:     Effort: Pulmonary effort is normal.     Breath sounds: Normal breath sounds.  Abdominal:     General: Bowel sounds are normal.     Palpations: Abdomen is soft.  Skin:    General: Skin is warm and  dry.  Neurological:     Mental Status: He is alert and oriented to person, place, and time.        Latest Ref Rng & Units 09/12/2021    4:32 AM  CMP  Glucose 70 - 99 mg/dL 100   BUN 8 - 23 mg/dL 18   Creatinine 0.61 - 1.24 mg/dL 1.13   Sodium 135 - 145 mmol/L 137   Potassium 3.5 - 5.1 mmol/L 4.5   Chloride 98 - 111 mmol/L 109   CO2 22 - 32 mmol/L 23   Calcium 8.9 - 10.3 mg/dL 8.2       Latest Ref Rng & Units 10/02/2021    7:49 AM  CBC  WBC 4.0 - 10.5 K/uL 7.4   Hemoglobin 13.0 - 17.0 g/dL 12.0   Hematocrit 39.0 - 52.0 % 36.5   Platelets 150 - 400 K/uL 243     No images are attached to the encounter.  PERIPHERAL VASCULAR CATHETERIZATION  Result Date: 09/25/2021 See surgical note for result.  PERIPHERAL VASCULAR CATHETERIZATION  Result Date: 09/11/2021 See surgical note for result.  DG Chest Port 1 View  Result Date: 09/08/2021 CLINICAL DATA:  76 year old male status post right lung biopsy EXAM: PORTABLE CHEST 1 VIEW COMPARISON:  09/08/2021 FINDINGS: Cardiomediastinal silhouette unchanged in size and contour. Opacity in the right upper lobe, with known FDG avid mass. No pneumothorax or pleural effusion.  Low lung volumes. Coarsened interstitial markings throughout. IMPRESSION: Negative for pneumothorax status post right lung mass biopsy Electronically Signed   By: Corrie Mckusick D.O.   On: 09/08/2021 12:38   CT LUNG MASS BIOPSY  Result Date: 09/08/2021 INDICATION:  76 year old male with right upper lobe mass referred for biopsy EXAM: CT BIOPSY CORE LUNG/MEDIASTINUM MEDICATIONS: None. ANESTHESIA/SEDATION: Moderate (conscious) sedation was employed during this procedure. A total of Versed 2.0 mg and Fentanyl 75 mcg was administered intravenously. Moderate Sedation Time: 14 minutes. The patient's level of consciousness and vital signs were monitored continuously by radiology nursing throughout the procedure under my direct supervision. FLUOROSCOPY TIME:  CT COMPLICATIONS: None  PROCEDURE: The procedure, risks, benefits, and alternatives were explained to the patient and the patient's family. Specific risks that were addressed included bleeding, infection, pneumothorax, need for further procedure including chest tube placement, chance of delayed pneumothorax or hemorrhage, hemoptysis, nondiagnostic sample, cardiopulmonary collapse, death. Questions regarding the procedure were encouraged and answered. The patient understands and consents to the procedure. Patient was positioned in the supine position on the CT gantry table and a scout CT of the chest was performed for planning purposes. Once angle of approach was determined, the skin and subcutaneous tissues this scan was prepped and draped in the usual sterile fashion, and a sterile drape was applied covering the operative field. A sterile gown and sterile gloves were used for the procedure. Local anesthesia was provided with 1% Lidocaine. The skin and subcutaneous tissues were infiltrated 1% lidocaine for local anesthesia, and a small stab incision was made with an 11 blade scalpel. Using CT guidance, a 17 gauge trocar needle was advanced into the right upper lobe pleural basedtarget. After confirmation of the tip, separate 18 gauge core biopsies were performed. These were placed into solution for transportation to the lab. A final CT image was performed. Patient tolerated the procedure well and remained hemodynamically stable throughout. No complications were encountered and no significant blood loss was encounter FINDINGS: Small pneumothorax at the completion, with no pulmonary hemorrhage IMPRESSION: Status post CT-guided biopsy of right upper lobe pleural base target. Signed, Dulcy Fanny. Nadene Rubins, RPVI [facility] Electronically Signed   By: Corrie Mckusick D.O.   On: 09/08/2021 11:54     Assessment and plan- Patient is a 76 y.o. male  with newly diagnosed squamous cell  carcinoma of the right upper lobe stage II BC T2N 1M0.  He is here for on treatment assessment prior to cycle 1 of weekly CarboTaxol chemotherapy  Counts okay to proceed with cycle 1 of weekly CarboTaxol chemotherapy today.  He will directly proceed for cycle 2 next week and will be seen by covering MD or NP in 2 weeks for cycle 3.  I will see him back for cycle 5.   Discussed that for stage II disease we can likely stop after chemo/RT. Immunotherapy remains optional.    Visit Diagnosis 1. Encounter for antineoplastic chemotherapy   2. Malignant neoplasm of upper lobe of right lung [facility])      Dr. Randa Evens, MD, MPH [facility] at [facility] [phone] 10/02/2021 12:44 PM

## 2021-10-03 ENCOUNTER — Ambulatory Visit: Admission: RE | Admit: 2021-10-03 | Payer: Medicare Other | Source: Ambulatory Visit

## 2021-10-04 ENCOUNTER — Ambulatory Visit
Admission: RE | Admit: 2021-10-04 | Discharge: 2021-10-04 | Disposition: A | Discharge status: Home / Self Care | Payer: Medicare Other | Source: Ambulatory Visit | Attending: Radiation Oncology | Admitting: Radiation Oncology

## 2021-10-04 ENCOUNTER — Other Ambulatory Visit: Payer: Self-pay

## 2021-10-04 ENCOUNTER — Ambulatory Visit: Payer: Medicare Other

## 2021-10-04 DIAGNOSIS — C3411 Malignant neoplasm of upper lobe, right bronchus or lung: Secondary | ICD-10-CM | POA: Insufficient documentation

## 2021-10-04 DIAGNOSIS — Z5111 Encounter for antineoplastic chemotherapy: Secondary | ICD-10-CM | POA: Insufficient documentation

## 2021-10-04 DIAGNOSIS — Z87891 Personal history of nicotine dependence: Secondary | ICD-10-CM | POA: Diagnosis not present

## 2021-10-04 DIAGNOSIS — R0789 Other chest pain: Secondary | ICD-10-CM | POA: Insufficient documentation

## 2021-10-04 DIAGNOSIS — Z510 Encounter for antineoplastic radiation therapy: Secondary | ICD-10-CM | POA: Diagnosis not present

## 2021-10-04 DIAGNOSIS — D701 Agranulocytosis secondary to cancer chemotherapy: Secondary | ICD-10-CM | POA: Diagnosis not present

## 2021-10-04 LAB — RAD ONC ARIA SESSION SUMMARY
Course Elapsed Days: 0
Plan Fractions Treated to Date: 1
Plan Prescribed Dose Per Fraction: 2 Gy
Plan Total Fractions Prescribed: 35
Plan Total Prescribed Dose: 70 Gy
Reference Point Dosage Given to Date: 2 Gy
Reference Point Session Dosage Given: 2 Gy
Session Number: 1

## 2021-10-05 ENCOUNTER — Ambulatory Visit
Admission: RE | Admit: 2021-10-05 | Discharge: 2021-10-05 | Disposition: A | Discharge status: Home / Self Care | Payer: Medicare Other | Source: Ambulatory Visit | Attending: Radiation Oncology | Admitting: Radiation Oncology

## 2021-10-05 ENCOUNTER — Other Ambulatory Visit: Payer: Self-pay

## 2021-10-05 DIAGNOSIS — D701 Agranulocytosis secondary to cancer chemotherapy: Secondary | ICD-10-CM | POA: Diagnosis not present

## 2021-10-05 DIAGNOSIS — Z87891 Personal history of nicotine dependence: Secondary | ICD-10-CM | POA: Diagnosis not present

## 2021-10-05 DIAGNOSIS — C3411 Malignant neoplasm of upper lobe, right bronchus or lung: Secondary | ICD-10-CM | POA: Diagnosis not present

## 2021-10-05 DIAGNOSIS — R0789 Other chest pain: Secondary | ICD-10-CM | POA: Diagnosis not present

## 2021-10-05 DIAGNOSIS — Z5111 Encounter for antineoplastic chemotherapy: Secondary | ICD-10-CM | POA: Diagnosis not present

## 2021-10-05 DIAGNOSIS — Z510 Encounter for antineoplastic radiation therapy: Secondary | ICD-10-CM | POA: Diagnosis not present

## 2021-10-05 LAB — RAD ONC ARIA SESSION SUMMARY
Course Elapsed Days: 1
Plan Fractions Treated to Date: 2
Plan Prescribed Dose Per Fraction: 2 Gy
Plan Total Fractions Prescribed: 35
Plan Total Prescribed Dose: 70 Gy
Reference Point Dosage Given to Date: 4 Gy
Reference Point Session Dosage Given: 2 Gy
Session Number: 2

## 2021-10-06 ENCOUNTER — Other Ambulatory Visit: Payer: Self-pay

## 2021-10-06 ENCOUNTER — Ambulatory Visit
Admission: RE | Admit: 2021-10-06 | Discharge: 2021-10-06 | Disposition: A | Discharge status: Home / Self Care | Payer: Medicare Other | Source: Ambulatory Visit | Attending: Radiation Oncology | Admitting: Radiation Oncology

## 2021-10-06 ENCOUNTER — Ambulatory Visit: Payer: Medicare Other

## 2021-10-06 DIAGNOSIS — R0789 Other chest pain: Secondary | ICD-10-CM | POA: Diagnosis not present

## 2021-10-06 DIAGNOSIS — Z87891 Personal history of nicotine dependence: Secondary | ICD-10-CM | POA: Diagnosis not present

## 2021-10-06 DIAGNOSIS — C3411 Malignant neoplasm of upper lobe, right bronchus or lung: Secondary | ICD-10-CM | POA: Diagnosis not present

## 2021-10-06 DIAGNOSIS — Z5111 Encounter for antineoplastic chemotherapy: Secondary | ICD-10-CM | POA: Diagnosis not present

## 2021-10-06 DIAGNOSIS — D701 Agranulocytosis secondary to cancer chemotherapy: Secondary | ICD-10-CM | POA: Diagnosis not present

## 2021-10-06 DIAGNOSIS — Z510 Encounter for antineoplastic radiation therapy: Secondary | ICD-10-CM | POA: Diagnosis not present

## 2021-10-06 LAB — RAD ONC ARIA SESSION SUMMARY
Course Elapsed Days: 2
Plan Fractions Treated to Date: 3
Plan Prescribed Dose Per Fraction: 2 Gy
Plan Total Fractions Prescribed: 35
Plan Total Prescribed Dose: 70 Gy
Reference Point Dosage Given to Date: 6 Gy
Reference Point Session Dosage Given: 2 Gy
Session Number: 3

## 2021-10-06 MED FILL — Dexamethasone Sodium Phosphate Inj 100 MG/10ML: INTRAMUSCULAR | Qty: 1 | Status: AC

## 2021-10-07 ENCOUNTER — Other Ambulatory Visit: Payer: Self-pay | Admitting: Family Medicine

## 2021-10-09 ENCOUNTER — Ambulatory Visit: Payer: Medicare Other

## 2021-10-09 ENCOUNTER — Ambulatory Visit
Admission: RE | Admit: 2021-10-09 | Discharge: 2021-10-09 | Disposition: A | Discharge status: Home / Self Care | Payer: Medicare Other | Source: Ambulatory Visit | Attending: Radiation Oncology | Admitting: Radiation Oncology

## 2021-10-09 ENCOUNTER — Other Ambulatory Visit: Payer: Medicare Other

## 2021-10-09 ENCOUNTER — Other Ambulatory Visit: Payer: Self-pay

## 2021-10-09 ENCOUNTER — Inpatient Hospital Stay: Payer: Medicare Other | Attending: Oncology

## 2021-10-09 ENCOUNTER — Inpatient Hospital Stay: Payer: Medicare Other

## 2021-10-09 VITALS — BP 121/71 | HR 75 | Temp 97.0°F | Wt 165.6 lb

## 2021-10-09 DIAGNOSIS — Z5111 Encounter for antineoplastic chemotherapy: Secondary | ICD-10-CM | POA: Insufficient documentation

## 2021-10-09 DIAGNOSIS — C3411 Malignant neoplasm of upper lobe, right bronchus or lung: Secondary | ICD-10-CM | POA: Diagnosis not present

## 2021-10-09 DIAGNOSIS — Z510 Encounter for antineoplastic radiation therapy: Secondary | ICD-10-CM | POA: Diagnosis not present

## 2021-10-09 DIAGNOSIS — Z87891 Personal history of nicotine dependence: Secondary | ICD-10-CM | POA: Diagnosis not present

## 2021-10-09 DIAGNOSIS — R0789 Other chest pain: Secondary | ICD-10-CM | POA: Insufficient documentation

## 2021-10-09 DIAGNOSIS — D701 Agranulocytosis secondary to cancer chemotherapy: Secondary | ICD-10-CM | POA: Insufficient documentation

## 2021-10-09 LAB — CBC WITH DIFFERENTIAL/PLATELET
Abs Immature Granulocytes: 0.05 10*3/uL (ref 0.00–0.07)
Basophils Absolute: 0 10*3/uL (ref 0.0–0.1)
Basophils Relative: 0 %
Eosinophils Absolute: 0 10*3/uL (ref 0.0–0.5)
Eosinophils Relative: 1 %
HCT: 34.9 % — ABNORMAL LOW (ref 39.0–52.0)
Hemoglobin: 11.6 g/dL — ABNORMAL LOW (ref 13.0–17.0)
Immature Granulocytes: 1 %
Lymphocytes Relative: 18 %
Lymphs Abs: 1 10*3/uL (ref 0.7–4.0)
MCH: 27.4 pg (ref 26.0–34.0)
MCHC: 33.2 g/dL (ref 30.0–36.0)
MCV: 82.5 fL (ref 80.0–100.0)
Monocytes Absolute: 0.3 10*3/uL (ref 0.1–1.0)
Monocytes Relative: 6 %
Neutro Abs: 4.2 10*3/uL (ref 1.7–7.7)
Neutrophils Relative %: 74 %
Platelets: 292 10*3/uL (ref 150–400)
RBC: 4.23 MIL/uL (ref 4.22–5.81)
RDW: 15.1 % (ref 11.5–15.5)
WBC: 5.6 10*3/uL (ref 4.0–10.5)
nRBC: 0 % (ref 0.0–0.2)

## 2021-10-09 LAB — RAD ONC ARIA SESSION SUMMARY
Course Elapsed Days: 5
Plan Fractions Treated to Date: 4
Plan Prescribed Dose Per Fraction: 2 Gy
Plan Total Fractions Prescribed: 35
Plan Total Prescribed Dose: 70 Gy
Reference Point Dosage Given to Date: 8 Gy
Reference Point Session Dosage Given: 2 Gy
Session Number: 4

## 2021-10-09 LAB — COMPREHENSIVE METABOLIC PANEL
ALT: 45 U/L — ABNORMAL HIGH (ref 0–44)
AST: 36 U/L (ref 15–41)
Albumin: 3.4 g/dL — ABNORMAL LOW (ref 3.5–5.0)
Alkaline Phosphatase: 81 U/L (ref 38–126)
Anion gap: 7 (ref 5–15)
BUN: 29 mg/dL — ABNORMAL HIGH (ref 8–23)
CO2: 22 mmol/L (ref 22–32)
Calcium: 8.5 mg/dL — ABNORMAL LOW (ref 8.9–10.3)
Chloride: 104 mmol/L (ref 98–111)
Creatinine, Ser: 1.21 mg/dL (ref 0.61–1.24)
GFR, Estimated: >60 mL/min (ref >60)
Glucose, Bld: 141 mg/dL — ABNORMAL HIGH (ref 70–99)
Potassium: 4.2 mmol/L (ref 3.5–5.1)
Sodium: 133 mmol/L — ABNORMAL LOW (ref 135–145)
Total Bilirubin: 0.4 mg/dL (ref 0.3–1.2)
Total Protein: 6.9 g/dL (ref 6.5–8.1)

## 2021-10-09 MED ORDER — HEPARIN SOD (PORK) LOCK FLUSH 100 UNIT/ML IV SOLN
INTRAVENOUS | Status: AC
  Administered 2021-10-09: 500 [IU]
  Filled 2021-10-09: qty 5

## 2021-10-09 MED ORDER — FAMOTIDINE IN NACL 20-0.9 MG/50ML-% IV SOLN
20.0000 mg | Freq: Once | INTRAVENOUS | Status: AC
  Administered 2021-10-09: 20 mg via INTRAVENOUS
  Filled 2021-10-09: qty 50

## 2021-10-09 MED ORDER — PALONOSETRON HCL INJECTION 0.25 MG/5ML
0.2500 mg | Freq: Once | INTRAVENOUS | Status: AC
  Administered 2021-10-09: 0.25 mg via INTRAVENOUS
  Filled 2021-10-09: qty 5

## 2021-10-09 MED ORDER — SODIUM CHLORIDE 0.9 % IV SOLN
163.8000 mg | Freq: Once | INTRAVENOUS | Status: AC
  Administered 2021-10-09: 160 mg via INTRAVENOUS
  Filled 2021-10-09: qty 16

## 2021-10-09 MED ORDER — DIPHENHYDRAMINE HCL 50 MG/ML IJ SOLN
50.0000 mg | Freq: Once | INTRAMUSCULAR | Status: AC
  Administered 2021-10-09: 50 mg via INTRAVENOUS
  Filled 2021-10-09: qty 1

## 2021-10-09 MED ORDER — SODIUM CHLORIDE 0.9 % IV SOLN
10.0000 mg | Freq: Once | INTRAVENOUS | Status: AC
  Administered 2021-10-09: 10 mg via INTRAVENOUS
  Filled 2021-10-09: qty 10

## 2021-10-09 MED ORDER — SODIUM CHLORIDE 0.9 % IV SOLN
45.0000 mg/m2 | Freq: Once | INTRAVENOUS | Status: AC
  Administered 2021-10-09: 90 mg via INTRAVENOUS
  Filled 2021-10-09: qty 15

## 2021-10-09 MED ORDER — SODIUM CHLORIDE 0.9 % IV SOLN
Freq: Once | INTRAVENOUS | Status: AC
  Filled 2021-10-09: qty 250

## 2021-10-09 MED ORDER — HEPARIN SOD (PORK) LOCK FLUSH 100 UNIT/ML IV SOLN
500.0000 [IU] | Freq: Once | INTRAVENOUS | Status: AC | PRN
  Filled 2021-10-09: qty 5

## 2021-10-09 NOTE — Patient Instructions (Signed)
[facility] CANCER CTR AT [place]  Discharge Instructions: Thank you for choosing [facility] to provide your oncology and hematology care.  If you have a lab appointment with the [facility], please go directly to the [facility] and check in at the registration area.  Wear comfortable clothing and clothing appropriate for easy access to any Portacath or PICC line.   We strive to give you quality time with your provider. You may need to reschedule your appointment if you arrive late (15 or more minutes).  Arriving late affects you and other patients whose appointments are after yours.  Also, if you miss three or more appointments without notifying the office, you may be dismissed from the clinic at the provider's discretion.      For prescription refill requests, have your pharmacy contact our office and allow 72 hours for refills to be completed.    Today you received the following chemotherapy and/or immunotherapy agents Taxol & carboplatin      To help prevent nausea and vomiting after your treatment, we encourage you to take your nausea medication as directed.  BELOW ARE SYMPTOMS THAT SHOULD BE REPORTED IMMEDIATELY: *FEVER GREATER THAN 100.4 F (38 C) OR HIGHER *CHILLS OR SWEATING *NAUSEA AND VOMITING THAT IS NOT CONTROLLED WITH YOUR NAUSEA MEDICATION *UNUSUAL SHORTNESS OF BREATH *UNUSUAL BRUISING OR BLEEDING *URINARY PROBLEMS (pain or burning when urinating, or frequent urination) *BOWEL PROBLEMS (unusual diarrhea, constipation, pain near the anus) TENDERNESS IN MOUTH AND THROAT WITH OR WITHOUT PRESENCE OF ULCERS (sore throat, sores in mouth, or a toothache) UNUSUAL RASH, SWELLING OR PAIN  UNUSUAL VAGINAL DISCHARGE OR ITCHING   Items with * indicate a potential emergency and should be followed up as soon as possible or go to the Emergency Department if any problems should occur.  Please show the CHEMOTHERAPY ALERT CARD or IMMUNOTHERAPY ALERT CARD at  check-in to the Emergency Department and triage nurse.  Should you have questions after your visit or need to cancel or reschedule your appointment, please contact [facility] CANCER [place] AT [place]  [phone] and follow the prompts.  Office hours are 8:00 a.m. to 4:30 p.m. Monday - Friday. Please note that voicemails left after 4:00 p.m. may not be returned until the following business day.  We are closed weekends and major holidays. You have access to a nurse at all times for urgent questions. Please call the main number to the clinic [phone] and follow the prompts.  For any non-urgent questions, you may also contact your provider using MyChart. We now offer e-Visits for anyone 54 and older to request care online for non-urgent symptoms. For details visit [email or web address].   Also download the MyChart app! Go to the app store, search "MyChart", open the app, select [place], and log in with your MyChart username and password.  Masks are optional in the cancer centers. If you would like for your care team to wear a mask while they are taking care of you, please let them know. For doctor visits, patients may have with them one support person who is at least 77 years old. At this time, visitors are not allowed in the infusion area.

## 2021-10-10 ENCOUNTER — Other Ambulatory Visit: Payer: Self-pay | Admitting: Family Medicine

## 2021-10-10 ENCOUNTER — Ambulatory Visit
Admission: RE | Admit: 2021-10-10 | Discharge: 2021-10-10 | Disposition: A | Discharge status: Home / Self Care | Payer: Medicare Other | Source: Ambulatory Visit | Attending: Radiation Oncology | Admitting: Radiation Oncology

## 2021-10-10 ENCOUNTER — Other Ambulatory Visit: Payer: Self-pay

## 2021-10-10 DIAGNOSIS — Z87891 Personal history of nicotine dependence: Secondary | ICD-10-CM | POA: Diagnosis not present

## 2021-10-10 DIAGNOSIS — D701 Agranulocytosis secondary to cancer chemotherapy: Secondary | ICD-10-CM | POA: Diagnosis not present

## 2021-10-10 DIAGNOSIS — C3411 Malignant neoplasm of upper lobe, right bronchus or lung: Secondary | ICD-10-CM | POA: Diagnosis not present

## 2021-10-10 DIAGNOSIS — R0789 Other chest pain: Secondary | ICD-10-CM | POA: Diagnosis not present

## 2021-10-10 DIAGNOSIS — Z5111 Encounter for antineoplastic chemotherapy: Secondary | ICD-10-CM | POA: Diagnosis not present

## 2021-10-10 DIAGNOSIS — Z510 Encounter for antineoplastic radiation therapy: Secondary | ICD-10-CM | POA: Diagnosis not present

## 2021-10-10 LAB — RAD ONC ARIA SESSION SUMMARY
Course Elapsed Days: 6
Plan Fractions Treated to Date: 5
Plan Prescribed Dose Per Fraction: 2 Gy
Plan Total Fractions Prescribed: 35
Plan Total Prescribed Dose: 70 Gy
Reference Point Dosage Given to Date: 10 Gy
Reference Point Session Dosage Given: 2 Gy
Session Number: 5

## 2021-10-11 ENCOUNTER — Other Ambulatory Visit: Payer: Self-pay

## 2021-10-11 ENCOUNTER — Ambulatory Visit: Payer: Medicare Other

## 2021-10-11 ENCOUNTER — Encounter: Payer: Self-pay | Admitting: *Deleted

## 2021-10-11 ENCOUNTER — Ambulatory Visit
Admission: RE | Admit: 2021-10-11 | Discharge: 2021-10-11 | Disposition: A | Discharge status: Home / Self Care | Payer: Medicare Other | Source: Ambulatory Visit | Attending: Radiation Oncology | Admitting: Radiation Oncology

## 2021-10-11 DIAGNOSIS — R0789 Other chest pain: Secondary | ICD-10-CM | POA: Diagnosis not present

## 2021-10-11 DIAGNOSIS — Z5111 Encounter for antineoplastic chemotherapy: Secondary | ICD-10-CM | POA: Diagnosis not present

## 2021-10-11 DIAGNOSIS — Z510 Encounter for antineoplastic radiation therapy: Secondary | ICD-10-CM | POA: Diagnosis not present

## 2021-10-11 DIAGNOSIS — C3411 Malignant neoplasm of upper lobe, right bronchus or lung: Secondary | ICD-10-CM | POA: Diagnosis not present

## 2021-10-11 DIAGNOSIS — Z87891 Personal history of nicotine dependence: Secondary | ICD-10-CM | POA: Diagnosis not present

## 2021-10-11 DIAGNOSIS — D701 Agranulocytosis secondary to cancer chemotherapy: Secondary | ICD-10-CM | POA: Diagnosis not present

## 2021-10-11 LAB — RAD ONC ARIA SESSION SUMMARY
Course Elapsed Days: 7
Plan Fractions Treated to Date: 6
Plan Prescribed Dose Per Fraction: 2 Gy
Plan Total Fractions Prescribed: 35
Plan Total Prescribed Dose: 70 Gy
Reference Point Dosage Given to Date: 12 Gy
Reference Point Session Dosage Given: 2 Gy
Session Number: 6

## 2021-10-11 NOTE — Progress Notes (Signed)
Met with patient in the lobby after radiation treatment this morning. Pt states that he is doing well but has noticed that he is more fatigued this week with altered taste to foods. Reassured pt that these are common side effects of treatment. Encouraged to rest when needed but to stay active the best he can. Instructed to call with any questions or needs. Pt verbalized understanding.

## 2021-10-12 ENCOUNTER — Ambulatory Visit: Payer: Medicare Other | Admitting: Neurosurgery

## 2021-10-12 ENCOUNTER — Other Ambulatory Visit: Payer: Self-pay

## 2021-10-12 ENCOUNTER — Ambulatory Visit
Admission: RE | Admit: 2021-10-12 | Discharge: 2021-10-12 | Disposition: A | Discharge status: Home / Self Care | Payer: Medicare Other | Source: Ambulatory Visit | Attending: Radiation Oncology | Admitting: Radiation Oncology

## 2021-10-12 DIAGNOSIS — Z87891 Personal history of nicotine dependence: Secondary | ICD-10-CM | POA: Diagnosis not present

## 2021-10-12 DIAGNOSIS — R0789 Other chest pain: Secondary | ICD-10-CM | POA: Diagnosis not present

## 2021-10-12 DIAGNOSIS — C3411 Malignant neoplasm of upper lobe, right bronchus or lung: Secondary | ICD-10-CM | POA: Diagnosis not present

## 2021-10-12 DIAGNOSIS — Z510 Encounter for antineoplastic radiation therapy: Secondary | ICD-10-CM | POA: Diagnosis not present

## 2021-10-12 DIAGNOSIS — Z5111 Encounter for antineoplastic chemotherapy: Secondary | ICD-10-CM | POA: Diagnosis not present

## 2021-10-12 DIAGNOSIS — D701 Agranulocytosis secondary to cancer chemotherapy: Secondary | ICD-10-CM | POA: Diagnosis not present

## 2021-10-12 LAB — RAD ONC ARIA SESSION SUMMARY
Course Elapsed Days: 8
Plan Fractions Treated to Date: 7
Plan Prescribed Dose Per Fraction: 2 Gy
Plan Total Fractions Prescribed: 35
Plan Total Prescribed Dose: 70 Gy
Reference Point Dosage Given to Date: 14 Gy
Reference Point Session Dosage Given: 2 Gy
Session Number: 7

## 2021-10-13 ENCOUNTER — Other Ambulatory Visit: Payer: Self-pay

## 2021-10-13 ENCOUNTER — Ambulatory Visit
Admission: RE | Admit: 2021-10-13 | Discharge: 2021-10-13 | Disposition: A | Discharge status: Home / Self Care | Payer: Medicare Other | Source: Ambulatory Visit | Attending: Radiation Oncology | Admitting: Radiation Oncology

## 2021-10-13 DIAGNOSIS — D701 Agranulocytosis secondary to cancer chemotherapy: Secondary | ICD-10-CM | POA: Diagnosis not present

## 2021-10-13 DIAGNOSIS — R0789 Other chest pain: Secondary | ICD-10-CM | POA: Diagnosis not present

## 2021-10-13 DIAGNOSIS — Z510 Encounter for antineoplastic radiation therapy: Secondary | ICD-10-CM | POA: Diagnosis not present

## 2021-10-13 DIAGNOSIS — Z87891 Personal history of nicotine dependence: Secondary | ICD-10-CM | POA: Diagnosis not present

## 2021-10-13 DIAGNOSIS — Z5111 Encounter for antineoplastic chemotherapy: Secondary | ICD-10-CM | POA: Diagnosis not present

## 2021-10-13 DIAGNOSIS — C3411 Malignant neoplasm of upper lobe, right bronchus or lung: Secondary | ICD-10-CM | POA: Diagnosis not present

## 2021-10-13 LAB — RAD ONC ARIA SESSION SUMMARY
Course Elapsed Days: 9
Plan Fractions Treated to Date: 8
Plan Prescribed Dose Per Fraction: 2 Gy
Plan Total Fractions Prescribed: 35
Plan Total Prescribed Dose: 70 Gy
Reference Point Dosage Given to Date: 16 Gy
Reference Point Session Dosage Given: 2 Gy
Session Number: 8

## 2021-10-13 MED FILL — Dexamethasone Sodium Phosphate Inj 100 MG/10ML: INTRAMUSCULAR | Qty: 1 | Status: AC

## 2021-10-15 ENCOUNTER — Other Ambulatory Visit: Payer: Self-pay

## 2021-10-15 ENCOUNTER — Inpatient Hospital Stay
Admission: EM | Admit: 2021-10-15 | Discharge: 2021-10-17 | DRG: 392 | Disposition: A | Discharge status: Home / Self Care | Payer: Medicare Other | Attending: Internal Medicine | Admitting: Internal Medicine

## 2021-10-15 ENCOUNTER — Emergency Department: Payer: Medicare Other

## 2021-10-15 DIAGNOSIS — Z800 Family history of malignant neoplasm of digestive organs: Secondary | ICD-10-CM

## 2021-10-15 DIAGNOSIS — Z885 Allergy status to narcotic agent status: Secondary | ICD-10-CM | POA: Diagnosis not present

## 2021-10-15 DIAGNOSIS — K5732 Diverticulitis of large intestine without perforation or abscess without bleeding: Principal | ICD-10-CM | POA: Diagnosis present

## 2021-10-15 DIAGNOSIS — C801 Malignant (primary) neoplasm, unspecified: Secondary | ICD-10-CM | POA: Diagnosis present

## 2021-10-15 DIAGNOSIS — E861 Hypovolemia: Secondary | ICD-10-CM | POA: Diagnosis not present

## 2021-10-15 DIAGNOSIS — E7800 Pure hypercholesterolemia, unspecified: Secondary | ICD-10-CM | POA: Diagnosis present

## 2021-10-15 DIAGNOSIS — Z955 Presence of coronary angioplasty implant and graft: Secondary | ICD-10-CM

## 2021-10-15 DIAGNOSIS — Z7902 Long term (current) use of antithrombotics/antiplatelets: Secondary | ICD-10-CM

## 2021-10-15 DIAGNOSIS — I252 Old myocardial infarction: Secondary | ICD-10-CM

## 2021-10-15 DIAGNOSIS — E860 Dehydration: Secondary | ICD-10-CM | POA: Diagnosis present

## 2021-10-15 DIAGNOSIS — M1990 Unspecified osteoarthritis, unspecified site: Secondary | ICD-10-CM | POA: Diagnosis not present

## 2021-10-15 DIAGNOSIS — E872 Acidosis, unspecified: Secondary | ICD-10-CM | POA: Diagnosis not present

## 2021-10-15 DIAGNOSIS — N390 Urinary tract infection, site not specified: Secondary | ICD-10-CM | POA: Diagnosis present

## 2021-10-15 DIAGNOSIS — T451X5A Adverse effect of antineoplastic and immunosuppressive drugs, initial encounter: Secondary | ICD-10-CM | POA: Diagnosis not present

## 2021-10-15 DIAGNOSIS — E871 Hypo-osmolality and hyponatremia: Secondary | ICD-10-CM | POA: Diagnosis present

## 2021-10-15 DIAGNOSIS — Z87891 Personal history of nicotine dependence: Secondary | ICD-10-CM

## 2021-10-15 DIAGNOSIS — J449 Chronic obstructive pulmonary disease, unspecified: Secondary | ICD-10-CM | POA: Diagnosis present

## 2021-10-15 DIAGNOSIS — C3411 Malignant neoplasm of upper lobe, right bronchus or lung: Secondary | ICD-10-CM | POA: Diagnosis present

## 2021-10-15 DIAGNOSIS — Z833 Family history of diabetes mellitus: Secondary | ICD-10-CM | POA: Diagnosis not present

## 2021-10-15 DIAGNOSIS — Z95828 Presence of other vascular implants and grafts: Secondary | ICD-10-CM

## 2021-10-15 DIAGNOSIS — I2510 Atherosclerotic heart disease of native coronary artery without angina pectoris: Secondary | ICD-10-CM | POA: Diagnosis not present

## 2021-10-15 DIAGNOSIS — I119 Hypertensive heart disease without heart failure: Secondary | ICD-10-CM | POA: Diagnosis present

## 2021-10-15 DIAGNOSIS — N281 Cyst of kidney, acquired: Secondary | ICD-10-CM | POA: Diagnosis not present

## 2021-10-15 DIAGNOSIS — D630 Anemia in neoplastic disease: Secondary | ICD-10-CM | POA: Diagnosis not present

## 2021-10-15 DIAGNOSIS — R109 Unspecified abdominal pain: Secondary | ICD-10-CM | POA: Diagnosis not present

## 2021-10-15 DIAGNOSIS — K5792 Diverticulitis of intestine, part unspecified, without perforation or abscess without bleeding: Secondary | ICD-10-CM | POA: Diagnosis not present

## 2021-10-15 DIAGNOSIS — Z95820 Peripheral vascular angioplasty status with implants and grafts: Secondary | ICD-10-CM | POA: Diagnosis not present

## 2021-10-15 DIAGNOSIS — I10 Essential (primary) hypertension: Secondary | ICD-10-CM | POA: Diagnosis not present

## 2021-10-15 DIAGNOSIS — Z7984 Long term (current) use of oral hypoglycemic drugs: Secondary | ICD-10-CM

## 2021-10-15 DIAGNOSIS — I959 Hypotension, unspecified: Secondary | ICD-10-CM | POA: Diagnosis not present

## 2021-10-15 DIAGNOSIS — D6481 Anemia due to antineoplastic chemotherapy: Secondary | ICD-10-CM | POA: Diagnosis present

## 2021-10-15 DIAGNOSIS — K219 Gastro-esophageal reflux disease without esophagitis: Secondary | ICD-10-CM | POA: Diagnosis present

## 2021-10-15 DIAGNOSIS — E119 Type 2 diabetes mellitus without complications: Secondary | ICD-10-CM | POA: Diagnosis not present

## 2021-10-15 DIAGNOSIS — Z7982 Long term (current) use of aspirin: Secondary | ICD-10-CM

## 2021-10-15 DIAGNOSIS — Z8249 Family history of ischemic heart disease and other diseases of the circulatory system: Secondary | ICD-10-CM

## 2021-10-15 DIAGNOSIS — Z79899 Other long term (current) drug therapy: Secondary | ICD-10-CM

## 2021-10-15 LAB — CBC
HCT: 35.3 % — ABNORMAL LOW (ref 39.0–52.0)
Hemoglobin: 11.4 g/dL — ABNORMAL LOW (ref 13.0–17.0)
MCH: 26.6 pg (ref 26.0–34.0)
MCHC: 32.3 g/dL (ref 30.0–36.0)
MCV: 82.5 fL (ref 80.0–100.0)
Platelets: 266 10*3/uL (ref 150–400)
RBC: 4.28 MIL/uL (ref 4.22–5.81)
RDW: 15.7 % — ABNORMAL HIGH (ref 11.5–15.5)
WBC: 3.9 10*3/uL — ABNORMAL LOW (ref 4.0–10.5)
nRBC: 0 % (ref 0.0–0.2)

## 2021-10-15 LAB — URINALYSIS, ROUTINE W REFLEX MICROSCOPIC
Bilirubin Urine: NEGATIVE
Glucose, UA: NEGATIVE mg/dL
Hgb urine dipstick: NEGATIVE
Ketones, ur: NEGATIVE mg/dL
Nitrite: POSITIVE — AB
Protein, ur: NEGATIVE mg/dL
Specific Gravity, Urine: >1.046 — ABNORMAL HIGH (ref 1.005–1.030)
pH: 5 (ref 5.0–8.0)

## 2021-10-15 LAB — COMPREHENSIVE METABOLIC PANEL
ALT: 38 U/L (ref 0–44)
AST: 30 U/L (ref 15–41)
Albumin: 3.1 g/dL — ABNORMAL LOW (ref 3.5–5.0)
Alkaline Phosphatase: 67 U/L (ref 38–126)
Anion gap: 8 (ref 5–15)
BUN: 23 mg/dL (ref 8–23)
CO2: 19 mmol/L — ABNORMAL LOW (ref 22–32)
Calcium: 8.5 mg/dL — ABNORMAL LOW (ref 8.9–10.3)
Chloride: 104 mmol/L (ref 98–111)
Creatinine, Ser: 1.32 mg/dL — ABNORMAL HIGH (ref 0.61–1.24)
GFR, Estimated: 56 mL/min — ABNORMAL LOW (ref >60)
Glucose, Bld: 106 mg/dL — ABNORMAL HIGH (ref 70–99)
Potassium: 4.6 mmol/L (ref 3.5–5.1)
Sodium: 131 mmol/L — ABNORMAL LOW (ref 135–145)
Total Bilirubin: 1.1 mg/dL (ref 0.3–1.2)
Total Protein: 6.5 g/dL (ref 6.5–8.1)

## 2021-10-15 LAB — PROTIME-INR
INR: 1.1 (ref 0.8–1.2)
Prothrombin Time: 14 seconds (ref 11.4–15.2)

## 2021-10-15 LAB — CBG MONITORING, ED: Glucose-Capillary: 107 mg/dL — ABNORMAL HIGH (ref 70–99)

## 2021-10-15 MED ORDER — ONDANSETRON HCL 4 MG/2ML IJ SOLN
4.0000 mg | Freq: Once | INTRAMUSCULAR | Status: AC
  Administered 2021-10-15: 4 mg via INTRAVENOUS
  Filled 2021-10-15: qty 2

## 2021-10-15 MED ORDER — INSULIN ASPART 100 UNIT/ML IJ SOLN: 0.0000 [IU] | Freq: Three times a day (TID) | INTRAMUSCULAR | Status: DC

## 2021-10-15 MED ORDER — MORPHINE SULFATE (PF) 4 MG/ML IV SOLN
4.0000 mg | Freq: Once | INTRAVENOUS | Status: AC
  Administered 2021-10-15: 4 mg via INTRAVENOUS
  Filled 2021-10-15: qty 1

## 2021-10-15 MED ORDER — CLOPIDOGREL BISULFATE 75 MG PO TABS
75.0000 mg | ORAL_TABLET | Freq: Every day | ORAL | Status: DC
  Administered 2021-10-16 – 2021-10-17 (×2): 75 mg via ORAL
  Filled 2021-10-15 (×2): qty 1

## 2021-10-15 MED ORDER — ACETAMINOPHEN 325 MG PO TABS
650.0000 mg | ORAL_TABLET | Freq: Four times a day (QID) | ORAL | Status: DC | PRN
  Administered 2021-10-16: 650 mg via ORAL
  Filled 2021-10-15: qty 2

## 2021-10-15 MED ORDER — PIPERACILLIN-TAZOBACTAM 3.375 G IVPB
3.3750 g | Freq: Three times a day (TID) | INTRAVENOUS | Status: DC
  Administered 2021-10-15 – 2021-10-16 (×2): 3.375 g via INTRAVENOUS
  Filled 2021-10-15 (×2): qty 50

## 2021-10-15 MED ORDER — RAMIPRIL 10 MG PO CAPS
10.0000 mg | ORAL_CAPSULE | Freq: Every day | ORAL | Status: DC
  Administered 2021-10-16 – 2021-10-17 (×2): 10 mg via ORAL
  Filled 2021-10-15 (×2): qty 1

## 2021-10-15 MED ORDER — ATORVASTATIN CALCIUM 20 MG PO TABS
80.0000 mg | ORAL_TABLET | Freq: Every day | ORAL | Status: DC
Start: 2021-10-15 — End: 2021-10-17
  Administered 2021-10-15 – 2021-10-16 (×2): 80 mg via ORAL
  Filled 2021-10-15 (×2): qty 4

## 2021-10-15 MED ORDER — ONDANSETRON HCL 4 MG/2ML IJ SOLN: 4.0000 mg | Freq: Four times a day (QID) | INTRAMUSCULAR | Status: DC | PRN

## 2021-10-15 MED ORDER — ENOXAPARIN SODIUM 40 MG/0.4ML IJ SOSY
40.0000 mg | PREFILLED_SYRINGE | INTRAMUSCULAR | Status: DC
  Administered 2021-10-16 – 2021-10-17 (×2): 40 mg via SUBCUTANEOUS
  Filled 2021-10-15 (×2): qty 0.4

## 2021-10-15 MED ORDER — FENTANYL CITRATE PF 50 MCG/ML IJ SOSY
50.0000 ug | PREFILLED_SYRINGE | Freq: Once | INTRAMUSCULAR | Status: AC
  Administered 2021-10-15: 50 ug via INTRAVENOUS
  Filled 2021-10-15: qty 1

## 2021-10-15 MED ORDER — MORPHINE SULFATE (PF) 2 MG/ML IV SOLN
2.0000 mg | INTRAVENOUS | Status: DC | PRN
  Administered 2021-10-16 – 2021-10-17 (×3): 2 mg via INTRAVENOUS
  Filled 2021-10-15 (×3): qty 1

## 2021-10-15 MED ORDER — ONDANSETRON HCL 4 MG PO TABS: 4.0000 mg | ORAL_TABLET | Freq: Four times a day (QID) | ORAL | Status: DC | PRN

## 2021-10-15 MED ORDER — INSULIN ASPART 100 UNIT/ML IJ SOLN: 0.0000 [IU] | Freq: Every day | INTRAMUSCULAR | Status: DC

## 2021-10-15 MED ORDER — IOHEXOL 350 MG/ML SOLN
100.0000 mL | Freq: Once | INTRAVENOUS | Status: AC | PRN
  Administered 2021-10-15: 100 mL via INTRAVENOUS

## 2021-10-15 MED ORDER — SODIUM CHLORIDE 0.9 % IV BOLUS
1000.0000 mL | Freq: Once | INTRAVENOUS | Status: AC
  Administered 2021-10-15: 1000 mL via INTRAVENOUS

## 2021-10-15 MED ORDER — TAMSULOSIN HCL 0.4 MG PO CAPS
0.4000 mg | ORAL_CAPSULE | Freq: Every day | ORAL | Status: DC
  Administered 2021-10-16 – 2021-10-17 (×2): 0.4 mg via ORAL
  Filled 2021-10-15 (×2): qty 1

## 2021-10-15 MED ORDER — PANTOPRAZOLE SODIUM 40 MG PO TBEC
40.0000 mg | DELAYED_RELEASE_TABLET | Freq: Every day | ORAL | Status: DC
  Administered 2021-10-16 – 2021-10-17 (×2): 40 mg via ORAL
  Filled 2021-10-15 (×2): qty 1

## 2021-10-15 MED ORDER — AMLODIPINE BESYLATE 10 MG PO TABS
10.0000 mg | ORAL_TABLET | Freq: Every day | ORAL | Status: DC
  Administered 2021-10-16 – 2021-10-17 (×2): 10 mg via ORAL
  Filled 2021-10-15: qty 1
  Filled 2021-10-15: qty 2

## 2021-10-15 MED ORDER — ASPIRIN 81 MG PO TBEC
81.0000 mg | DELAYED_RELEASE_TABLET | Freq: Every day | ORAL | Status: DC
  Administered 2021-10-16 – 2021-10-17 (×2): 81 mg via ORAL
  Filled 2021-10-15 (×2): qty 1

## 2021-10-15 MED ORDER — ACETAMINOPHEN 650 MG RE SUPP: 650.0000 mg | Freq: Four times a day (QID) | RECTAL | Status: DC | PRN

## 2021-10-15 MED ORDER — LACTATED RINGERS IV SOLN: INTRAVENOUS | Status: DC

## 2021-10-15 MED ORDER — ACETAMINOPHEN 500 MG PO TABS
1000.0000 mg | ORAL_TABLET | Freq: Once | ORAL | Status: AC
  Administered 2021-10-15: 1000 mg via ORAL
  Filled 2021-10-15: qty 2

## 2021-10-15 MED ORDER — METOPROLOL TARTRATE 25 MG PO TABS
25.0000 mg | ORAL_TABLET | Freq: Two times a day (BID) | ORAL | Status: DC
  Administered 2021-10-15 – 2021-10-17 (×4): 25 mg via ORAL
  Filled 2021-10-15 (×4): qty 1

## 2021-10-15 NOTE — Assessment & Plan Note (Addendum)
Stable and not acutely exacerbated Duonebs prn

## 2021-10-15 NOTE — H&P (Signed)
History and Physical    Patient: Luke Rojas TKZ:[id number] DOB: [birth year] DOA: 10/15/2021 DOS: the patient was seen and examined on 10/15/2021 PCP: Virginia Crews, MD  Patient coming from: Home  Chief Complaint:  Chief Complaint  Patient presents with   Flank Pain   Rectal Bleeding    HPI: Luke Rojas is a 76 y.o. male with medical history significant for COPD, DM, CAD, s/p right ICA stent placement for high-grade carotid artery stenosis on 09/11/2021, recently diagnosed right lung cancer initiated on XRT and chemo on 09/22/2021, presents to the ED with a several day history of left-sided flank pain that has been progressively increasing in severity.  It is associated with watery diarrhea and he has noted small amounts of blood when he wipes.  He denies fever or chills.  Has nausea but no vomiting ED course and data review: On arrival BP 90/49 with pulse of 101.  Afebrile, respirations 22 and O2 sat 99% on room air. Labs: WBC 3.9, hemoglobin 11.4 at/near baseline 11.6-12, platelets 266.  Creatinine 1.32 up from baseline of 1.05 with CO2 of 19 Imaging: CT abdomen and pelvis showed acute uncomplicated diverticulitis of the descending colon CTA chest negative for PE.  Shows unchanged 4.8 cm right upper lobe mass consistent with primary bronchogenic carcinoma.  Patient treated with fentanyl, morphine, Zofran and an IV fluid bolus.  Started on Zosyn.  Hospitalist consulted for admission.     Past Medical History:  Diagnosis Date   Arthritis    OSTEOARTHRITIS   Chronic kidney disease    kidney stones   COPD (chronic obstructive pulmonary disease) (HCC)    Coronary artery disease    2 stents    Diabetes mellitus without complication (HCC)    GERD (gastroesophageal reflux disease)    Hypercholesteremia    Hypertension    Lung cancer ([place])    Myocardial infarction [facility]) [id number]   Renal colic    Past Surgical History:  Procedure Laterality Date   CARDIAC  CATHETERIZATION  2013   2 stents   CAROTID PTA/STENT INTERVENTION Right 09/11/2021   Procedure: CAROTID PTA/STENT INTERVENTION;  Surgeon: Algernon Huxley, MD;  Location: [place] [facility];  Service: Cardiovascular;  Laterality: Right;   CATARACT EXTRACTION, BILATERAL     COLONOSCOPY  04/27/05   COLONOSCOPY WITH PROPOFOL N/A 06/08/2015   Procedure: COLONOSCOPY WITH PROPOFOL;  Surgeon: Robert Bellow, MD;  Location: [facility] ENDOSCOPY;  Service: Endoscopy;  Laterality: N/A;   CYSTOSCOPY WITH INSERTION OF UROLIFT N/A 02/11/2020   Procedure: CYSTOSCOPY WITH INSERTION OF UROLIFT;  Surgeon: Royston Cowper, MD;  Location: [facility] ORS;  Service: Urology;  Laterality: N/A;   CYSTOSCOPY WITH STENT PLACEMENT Right 09/16/2016   Procedure: CYSTOSCOPY WITH STENT PLACEMENT;  Surgeon: Royston Cowper, MD;  Location: [facility] ORS;  Service: Urology;  Laterality: Right;   EXTRACORPOREAL SHOCK WAVE LITHOTRIPSY Left 11/04/2014   has had 2 previous lithotripsies   EXTRACORPOREAL SHOCK WAVE LITHOTRIPSY Left 03/24/2015   Procedure: EXTRACORPOREAL SHOCK WAVE LITHOTRIPSY (ESWL);  Surgeon: Royston Cowper, MD;  Location: [facility] ORS;  Service: Urology;  Laterality: Left;   EXTRACORPOREAL SHOCK WAVE LITHOTRIPSY Right 10/04/2016   Procedure: EXTRACORPOREAL SHOCK WAVE LITHOTRIPSY (ESWL);  Surgeon: Royston Cowper, MD;  Location: [facility] ORS;  Service: Urology;  Laterality: Right;   LEFT HEART CATH AND CORONARY ANGIOGRAPHY N/A 12/05/2018   Procedure: LEFT HEART CATH AND CORONARY ANGIOGRAPHY with possible pci and stent;  Surgeon: Yolonda Kida, MD;  Location: [facility]  INVASIVE CV [facility];  Service: Cardiovascular;  Laterality: N/A;   PORTA CATH INSERTION Right 09/25/2021   Procedure: PORTA CATH INSERTION;  Surgeon: Algernon Huxley, MD;  Location: [place] [facility];  Service: Cardiovascular;  Laterality: Right;   Social History:  reports that he quit smoking about 37 years ago. His smoking use included cigarettes. He has a 30.00 pack-year smoking  history. His smokeless tobacco use includes chew. He reports that he does not drink alcohol and does not use drugs.  Allergies  Allergen Reactions   Codeine Nausea Only, Nausea And Vomiting and Other (See Comments)    Other reaction(s): Vomiting    Family History  Problem Relation Age of Onset   Pulmonary embolism Mother    Transient ischemic attack Mother    Diabetes Mother    Pancreatic cancer Father    Hypertension Father    Diabetes Father    Cirrhosis Brother     Prior to Admission medications   Medication Sig Start Date End Date Taking? Authorizing Provider  amLODipine (NORVASC) 10 MG tablet TAKE 1 TABLET BY MOUTH ONCE A DAY 10/09/21   Gwyneth Sprout, FNP  aspirin EC 81 MG tablet Take 1 tablet (81 mg total) by mouth daily. Swallow whole. 09/12/21   Kris Hartmann, NP  atorvastatin (LIPITOR) 80 MG tablet TAKE 1 TABLET BY MOUTH EVERY NIGHT AT BEDTIME 12/21/20   Tally Joe T, FNP  clopidogrel (PLAVIX) 75 MG tablet Take 75 mg by mouth daily.    [provider]  co-enzyme Q-10 30 MG capsule Take 30 mg by mouth daily.     [provider]  dexamethasone (DECADRON) 4 MG tablet Take 2 tablets (8 mg total) by mouth daily. Start the day after chemotherapy for 2 days. 09/18/21   Sindy Guadeloupe, MD  diclofenac sodium (VOLTAREN) 1 % GEL Apply 2 g topically 4 (four) times daily. 07/08/17   Chrismon, Vickki Muff, PA-C  docusate sodium (COLACE) 100 MG capsule Take 1 capsule (100 mg total) by mouth 2 (two) times daily. 08/17/21 08/17/22  Ward, Delice Bison, DO  Krill Oil 1000 MG CAPS Take 1,000 mg by mouth daily.     [provider]  lidocaine-prilocaine (EMLA) cream Apply to affected area once 09/18/21   Sindy Guadeloupe, MD  metFORMIN (GLUCOPHAGE-XR) 750 MG 24 hr tablet TAKE 1 TABLET BY MOUTH ONCE A DAY WITH BREAKFAST 10/10/21   Bacigalupo, Dionne Bucy, MD  Meth-Hyo-M Barnett Hatter Phos-Ph Sal (URIBEL) 118 MG CAPS Take 1 capsule (118 mg total) by mouth every 6 (six) hours as needed  (dysuria). Patient not taking: Reported on 09/11/2021 02/11/20   Royston Cowper, MD  metoprolol tartrate (LOPRESSOR) 25 MG tablet Take 1 tablet (25 mg total) by mouth 2 (two) times daily. 11/25/18   Gladstone Lighter, MD  MULTIPLE VITAMIN PO Take 1 tablet by mouth daily.    [provider]  nitroGLYCERIN (NITROSTAT) 0.4 MG SL tablet Place 1 tablet (0.4 mg total) under the tongue every 5 (five) minutes as needed for chest pain. Patient not taking: Reported on 09/18/2021 11/25/18   Gladstone Lighter, MD  ondansetron (ZOFRAN) 8 MG tablet Take 1 tablet (8 mg total) by mouth 2 (two) times daily as needed for refractory nausea / vomiting. Start on day 3 after chemo. Patient not taking: Reported on 10/02/2021 09/18/21   Sindy Guadeloupe, MD  ondansetron (ZOFRAN-ODT) 4 MG disintegrating tablet Take 1 tablet (4 mg total) by mouth every 6 (six) hours as needed  for nausea or vomiting. Patient not taking: Reported on 08/28/2021 08/17/21   Ward, Delice Bison, DO  OneTouch Delica Lancets 16X MISC TEST FASTING GLUCOSE LEVEL EACH MORNING BEFORE BREAKFAST 04/29/20   Jerrol Banana., MD  oxyCODONE-acetaminophen (PERCOCET/ROXICET) 5-325 MG tablet Take 1 tablet by mouth every 4 (four) hours as needed for moderate pain. Patient not taking: Reported on 09/18/2021 09/12/21   Kris Hartmann, NP  pantoprazole (PROTONIX) 40 MG tablet Take 40 mg by mouth daily. 01/21/19   [provider]  prochlorperazine (COMPAZINE) 10 MG tablet Take 1 tablet (10 mg total) by mouth every 6 (six) hours as needed (Nausea or vomiting). Patient not taking: Reported on 10/02/2021 09/18/21   Sindy Guadeloupe, MD  ramipril (ALTACE) 10 MG capsule Take 1 capsule (10 mg total) by mouth daily. 11/25/18   Gladstone Lighter, MD  tamsulosin (FLOMAX) 0.4 MG CAPS capsule Take 0.4 mg by mouth daily. 02/12/19   [provider]    Physical Exam: Vitals:   10/15/21 1830 10/15/21 1954 10/15/21 2001 10/15/21 2103  BP: 116/68   (!) 142/71   Pulse: 84 80 76 82  Resp: (!) 25 20 (!) 21 15  Temp:    98 F (36.7 C)  TempSrc:    Oral  SpO2: 100% 100% 99% 97%  Weight:      Height:       Physical Exam Vitals and nursing note reviewed.  Constitutional:      General: He is not in acute distress. HENT:     Head: Normocephalic and atraumatic.  Cardiovascular:     Rate and Rhythm: Normal rate and regular rhythm.     Heart sounds: Normal heart sounds.  Pulmonary:     Effort: Pulmonary effort is normal.     Breath sounds: Normal breath sounds.  Abdominal:     Palpations: Abdomen is soft.     Tenderness: There is no abdominal tenderness.  Neurological:     Mental Status: Mental status is at baseline.     Labs on Admission: I have personally reviewed following labs and imaging studies  CBC: Recent Labs  Lab 10/09/21 0843 10/15/21 1711  WBC 5.6 3.9*  NEUTROABS 4.2  --   HGB 11.6* 11.4*  HCT 34.9* 35.3*  MCV 82.5 82.5  PLT 292 096   Basic Metabolic Panel: Recent Labs  Lab 10/09/21 0843 10/15/21 1711  NA 133* 131*  K 4.2 4.6  CL 104 104  CO2 22 19*  GLUCOSE 141* 106*  BUN 29* 23  CREATININE 1.21 1.32*  CALCIUM 8.5* 8.5*   GFR: Estimated Creatinine Clearance: 46.1 mL/min (A) (by C-G formula based on SCr of 1.32 mg/dL (H)). Liver Function Tests: Recent Labs  Lab 10/09/21 0843 10/15/21 1711  AST 36 30  ALT 45* 38  ALKPHOS 81 67  BILITOT 0.4 1.1  PROT 6.9 6.5  ALBUMIN 3.4* 3.1*   No results for input(s): "LIPASE", "AMYLASE" in the last 168 hours. No results for input(s): "AMMONIA" in the last 168 hours. Coagulation Profile: Recent Labs  Lab 10/15/21 1711  INR 1.1   Cardiac Enzymes: No results for input(s): "CKTOTAL", "CKMB", "CKMBINDEX", "TROPONINI" in the last 168 hours. BNP (last 3 results) No results for input(s): "PROBNP" in the last 8760 hours. HbA1C: No results for input(s): "HGBA1C" in the last 72 hours. CBG: No results for input(s): "GLUCAP" in the last 168 hours. Lipid  Profile: No results for input(s): "CHOL", "HDL", "LDLCALC", "TRIG", "CHOLHDL", "LDLDIRECT" in the last 72  hours. Thyroid Function Tests: No results for input(s): "TSH", "T4TOTAL", "FREET4", "T3FREE", "THYROIDAB" in the last 72 hours. Anemia Panel: No results for input(s): "VITAMINB12", "FOLATE", "FERRITIN", "TIBC", "IRON", "RETICCTPCT" in the last 72 hours. Urine analysis:    Component Value Date/Time   COLORURINE YELLOW (A) 09/16/2016 0807   APPEARANCEUR HAZY (A) 09/16/2016 0807   APPEARANCEUR Clear 10/02/2011 1002   LABSPEC 1.014 09/16/2016 0807   LABSPEC 1.018 10/02/2011 1002   PHURINE 5.0 09/16/2016 0807   GLUCOSEU NEGATIVE 09/16/2016 0807   GLUCOSEU Negative 10/02/2011 1002   HGBUR MODERATE (A) 09/16/2016 0807   BILIRUBINUR neg 12/08/2019 1329   BILIRUBINUR Negative 10/02/2011 1002   KETONESUR NEGATIVE 09/16/2016 0807   PROTEINUR Negative 12/08/2019 1329   PROTEINUR NEGATIVE 09/16/2016 0807   UROBILINOGEN 0.2 12/08/2019 1329   NITRITE ++ 12/08/2019 1329   NITRITE POSITIVE (A) 09/16/2016 0807   LEUKOCYTESUR 4+ (A) 12/08/2019 1329   LEUKOCYTESUR Negative 10/02/2011 1002    Radiological Exams on Admission: CT Angio Chest PE W and/or Wo Contrast  Result Date: 10/15/2021 CLINICAL DATA:  High probability pulmonary embolism, left flank pain EXAM: CT ANGIOGRAPHY CHEST CT ABDOMEN AND PELVIS WITH CONTRAST TECHNIQUE: Multidetector CT imaging of the chest was performed using the standard protocol during bolus administration of intravenous contrast. Multiplanar CT image reconstructions and MIPs were obtained to evaluate the vascular anatomy. Multidetector CT imaging of the abdomen and pelvis was performed using the standard protocol during bolus administration of intravenous contrast. RADIATION DOSE REDUCTION: This exam was performed according to the departmental dose-optimization program which includes automated exposure control, adjustment of the mA and/or kV according to patient size  and/or use of iterative reconstruction technique. CONTRAST:  155mL OMNIPAQUE IOHEXOL 350 MG/ML SOLN COMPARISON:  PET/CT 08/29/2021 and CT abdomen and pelvis 06/19/2021 FINDINGS: CTA CHEST FINDINGS Cardiovascular: Satisfactory opacification of the pulmonary arteries to the segmental level. No evidence of pulmonary embolism. Cardiomegaly. No pericardial effusion. Coronary stenting and calcification. Aortic atherosclerotic calcification. Mediastinum/Nodes: Right hilar and subcarinal adenopathy similar in size to 08/29/2021 where they demonstrated increased FDG avidity. Normal thyroid. Unremarkable esophagus. The central airways are patent. Lungs/Pleura: Irregular right upper lobe mass now measures 4.3 x 4.4 cm, not significantly changed from 08/29/2021. Scarring/atelectasis in the lung bases. Emphysema. No pleural effusion or pneumothorax. Musculoskeletal: No chest wall abnormality. No acute or significant osseous findings. Review of the MIP images confirms the above findings. CT ABDOMEN and PELVIS FINDINGS Hepatobiliary: No suspicious focal liver abnormality is seen. No gallstones, gallbladder wall thickening, or biliary dilatation. Pancreas: Unremarkable. No pancreatic ductal dilatation or surrounding inflammatory changes. Spleen: Normal in size without focal abnormality. Adrenals/Urinary Tract: Adrenal glands are unremarkable. Kidneys are normal, without renal calculi, suspicious focal lesion, or hydronephrosis. Unchanged bilateral renal cysts not requiring follow-up. Bladder is unremarkable. Stomach/Bowel: Stomach and small bowel within normal limits. Colonic diverticulosis greatest within the descending colon. Wall thickening and adjacent inflammatory stranding about the descending colon compatible with diverticulitis. No drainable fluid collection. No free intraperitoneal air. Moderate colonic stool load. Normal appendix. Vascular/Lymphatic: Aortic atherosclerosis. No enlarged abdominal or pelvic lymph nodes.  Reproductive: Enlarged prostate containing metal clips. Other: Small fat containing umbilical hernia. Musculoskeletal: No acute or significant osseous findings. Review of the MIP images confirms the above findings. IMPRESSION: Chest: 1. Negative for acute pulmonary embolism. 2. Unchanged size of the 4.8 cm right upper lobe mass consistent with primary bronchogenic carcinoma. 3. Similar right hilar and subcarinal lymphadenopathy suspicious for metastases. 4. Aortic Atherosclerosis (ICD10-I70.0) and Emphysema (ICD10-J43.9). Abdomen/pelvis: 1.  Acute uncomplicated diverticulitis of the descending colon. Electronically Signed   By: Placido Sou M.D.   On: 10/15/2021 19:33   CT ABDOMEN PELVIS W CONTRAST  Result Date: 10/15/2021 CLINICAL DATA:  High probability pulmonary embolism, left flank pain EXAM: CT ANGIOGRAPHY CHEST CT ABDOMEN AND PELVIS WITH CONTRAST TECHNIQUE: Multidetector CT imaging of the chest was performed using the standard protocol during bolus administration of intravenous contrast. Multiplanar CT image reconstructions and MIPs were obtained to evaluate the vascular anatomy. Multidetector CT imaging of the abdomen and pelvis was performed using the standard protocol during bolus administration of intravenous contrast. RADIATION DOSE REDUCTION: This exam was performed according to the departmental dose-optimization program which includes automated exposure control, adjustment of the mA and/or kV according to patient size and/or use of iterative reconstruction technique. CONTRAST:  170mL OMNIPAQUE IOHEXOL 350 MG/ML SOLN COMPARISON:  PET/CT 08/29/2021 and CT abdomen and pelvis 06/19/2021 FINDINGS: CTA CHEST FINDINGS Cardiovascular: Satisfactory opacification of the pulmonary arteries to the segmental level. No evidence of pulmonary embolism. Cardiomegaly. No pericardial effusion. Coronary stenting and calcification. Aortic atherosclerotic calcification. Mediastinum/Nodes: Right hilar and subcarinal  adenopathy similar in size to 08/29/2021 where they demonstrated increased FDG avidity. Normal thyroid. Unremarkable esophagus. The central airways are patent. Lungs/Pleura: Irregular right upper lobe mass now measures 4.3 x 4.4 cm, not significantly changed from 08/29/2021. Scarring/atelectasis in the lung bases. Emphysema. No pleural effusion or pneumothorax. Musculoskeletal: No chest wall abnormality. No acute or significant osseous findings. Review of the MIP images confirms the above findings. CT ABDOMEN and PELVIS FINDINGS Hepatobiliary: No suspicious focal liver abnormality is seen. No gallstones, gallbladder wall thickening, or biliary dilatation. Pancreas: Unremarkable. No pancreatic ductal dilatation or surrounding inflammatory changes. Spleen: Normal in size without focal abnormality. Adrenals/Urinary Tract: Adrenal glands are unremarkable. Kidneys are normal, without renal calculi, suspicious focal lesion, or hydronephrosis. Unchanged bilateral renal cysts not requiring follow-up. Bladder is unremarkable. Stomach/Bowel: Stomach and small bowel within normal limits. Colonic diverticulosis greatest within the descending colon. Wall thickening and adjacent inflammatory stranding about the descending colon compatible with diverticulitis. No drainable fluid collection. No free intraperitoneal air. Moderate colonic stool load. Normal appendix. Vascular/Lymphatic: Aortic atherosclerosis. No enlarged abdominal or pelvic lymph nodes. Reproductive: Enlarged prostate containing metal clips. Other: Small fat containing umbilical hernia. Musculoskeletal: No acute or significant osseous findings. Review of the MIP images confirms the above findings. IMPRESSION: Chest: 1. Negative for acute pulmonary embolism. 2. Unchanged size of the 4.8 cm right upper lobe mass consistent with primary bronchogenic carcinoma. 3. Similar right hilar and subcarinal lymphadenopathy suspicious for metastases. 4. Aortic Atherosclerosis  (ICD10-I70.0) and Emphysema (ICD10-J43.9). Abdomen/pelvis: 1. Acute uncomplicated diverticulitis of the descending colon. Electronically Signed   By: Placido Sou M.D.   On: 10/15/2021 19:33     Data Reviewed: Relevant notes from primary care and specialist visits, past discharge summaries as available in EHR, including Care Everywhere. Prior diagnostic testing as pertinent to current admission diagnoses Updated medications and problem lists for reconciliation ED course, including vitals, labs, imaging, treatment and response to treatment Triage notes, nursing and pharmacy notes and ED provider's notes Notable results as noted in HPI   Assessment and Plan: * Acute diverticulitis Pain control, antiemetics and IV fluids Clear liquids Continue Zosyn  Hyponatremia Continue to hydrate , likely hypovolemic  Metabolic acidosis Likely secondary to diarrhea with bicarb of 19 Expecting correction with hydration Continue to monitor  S/p right Internal carotid artery stent 09/11/21 On dual antiplatelet therapy with aspirin and Plavix  COPD (chronic obstructive pulmonary disease) (HCC) Duonebs prn  Malignant neoplasm of upper lobe of right lung (HCC) On antineoplastic chemotherapy and XRT Leukopenia and anemia No acute related issues CT chest negative for PE Continue to monitor cell lines Consider oncology consult in the a.m.  Diabetes mellitus without complication (HCC) Sliding scale insulin coverage  CAD (coronary artery disease) No complaints of chest pain. On aspirin and Plavix which we will continue Continue atorvastatin and metoprolol and ramipril        DVT prophylaxis: lovenox  Consults: none  Advance Care Planning: none  Family Communication: none  Disposition Plan: Back to previous home environment  Severity of Illness: The appropriate patient status for this patient is INPATIENT. Inpatient status is judged to be reasonable and necessary in order to  provide the required intensity of service to ensure the patient's safety. The patient's presenting symptoms, physical exam findings, and initial radiographic and laboratory data in the context of their chronic comorbidities is felt to place them at high risk for further clinical deterioration. Furthermore, it is not anticipated that the patient will be medically stable for discharge from the hospital within 2 midnights of admission.   * I certify that at the point of admission it is my clinical judgment that the patient will require inpatient hospital care spanning beyond 2 midnights from the point of admission due to high intensity of service, high risk for further deterioration and high frequency of surveillance required.*  Author: Athena Masse, MD 10/15/2021 9:47 PM  For on call review [email or web address].

## 2021-10-15 NOTE — Assessment & Plan Note (Addendum)
Presents for evaluation of left lower quadrant pain Imaging shows acute uncomplicated diverticulitis of the descending colon Supportive care with pain control, antiemetics and IV fluids Clear liquids We will switch patient to Rocephin and Flagyl

## 2021-10-15 NOTE — Assessment & Plan Note (Addendum)
Blood sugars are stable Maintain consistent carbohydrate diet Continue metformin

## 2021-10-15 NOTE — Assessment & Plan Note (Addendum)
Likely secondary to diarrhea with bicarb of 19 on admission.   Resolved with IV fluid resuscitation

## 2021-10-15 NOTE — Assessment & Plan Note (Addendum)
Stable

## 2021-10-15 NOTE — Assessment & Plan Note (Signed)
No complaints of chest pain. On aspirin and Plavix which we will continue Continue atorvastatin and metoprolol and ramipril

## 2021-10-15 NOTE — ED Notes (Signed)
Pt is concerned about his chemo and radiation which are scheduled for 10/17/2011. Pt is supposed to have radiation at 0815 and chemo at 0900.

## 2021-10-15 NOTE — Consult Note (Signed)
Pharmacy Antibiotic Note  Luke Rojas is a 76 y.o. male admitted on 10/15/2021 with  Intra-abdominal infection .  Pharmacy has been consulted for Zosyn dosing.  Plan: Zosyn 3.375g IV q8h (4 hour infusion).  Height: 5\' 8"  (172.7 cm) Weight: 74.8 kg (165 lb) IBW/kg (Calculated) : 68.4  Temp (24hrs), Avg:98.8 F (37.1 C), Min:98.8 F (37.1 C), Max:98.8 F (37.1 C)  Recent Labs  Lab 10/09/21 0843 10/15/21 1711  WBC 5.6 3.9*  CREATININE 1.21 1.32*    Estimated Creatinine Clearance: 46.1 mL/min (A) (by C-G formula based on SCr of 1.32 mg/dL (H)).    Allergies  Allergen Reactions   Codeine Nausea Only, Nausea And Vomiting and Other (See Comments)    Other reaction(s): Vomiting    Antimicrobials this admission: 8/13 Zosyn >>    Dose adjustments this admission: none   Thank you for allowing pharmacy to be a part of this patient's care.  Dominick Zertuche Rodriguez-Guzman PharmD, BCPS 10/15/2021 8:39 PM

## 2021-10-15 NOTE — ED Provider Notes (Signed)
[facility] Provider Note    Event Date/Time   First MD Initiated Contact with Patient 10/15/21 1723     (approximate)   History   Flank Pain and Rectal Bleeding   HPI  Luke Rojas is a 76 y.o. male with past medical history of right-sided lung cancer, currently on chemotherapy, here with severe, left-sided flank pain.  The pain began fairly abruptly and has been increasingly severe for the last several days.  It is sharp, stabbing, worse with position changes but also worsens on its own.  He denies any trauma to the area.  He has a history of kidney stones but has not had any urinary symptoms.  He is also had profuse, watery diarrhea which has been an ongoing issue since he started his chemotherapy.  He has noticed small amounts of blood after he wipes.  He is on aspirin and Plavix.  Denies any known fevers.  He has had some chills.  Is felt nauseous due to the pain but no active vomiting.     Physical Exam   Triage Vital Signs: ED Triage Vitals  Enc Vitals Group     BP 10/15/21 1657 (!) 90/49     Pulse Rate 10/15/21 1657 (!) 101     Resp 10/15/21 1657 (!) 22     Temp 10/15/21 1657 98.8 F (37.1 C)     Temp Source 10/15/21 1657 Oral     SpO2 10/15/21 1657 99 %     Weight 10/15/21 1658 165 lb (74.8 kg)     Height 10/15/21 1658 5\' 8"  (1.727 m)     Head Circumference --      Peak Flow --      Pain Score 10/15/21 1658 10     Pain Loc --      Pain Edu? --      Excl. in [place]? --     Most recent vital signs: Vitals:   10/15/21 2001 10/15/21 2103  BP:  (!) 142/71  Pulse: 76 82  Resp: (!) 21 15  Temp:  98 F (36.7 C)  SpO2: 99% 97%     General: Awake, no distress.  CV:  Good peripheral perfusion.  Resp:  Normal effort.  Lungs clear bilaterally. Abd:  No distention.  Moderate diffuse left-sided flank and lower quadrant tenderness.  Slight guarding. Other:  Dry mucous membranes.   ED Results / Procedures / Treatments   Labs (all labs  ordered are listed, but only abnormal results are displayed) Labs Reviewed  COMPREHENSIVE METABOLIC PANEL - Abnormal; Notable for the following components:      Result Value   Sodium 131 (*)    CO2 19 (*)    Glucose, Bld 106 (*)    Creatinine, Ser 1.32 (*)    Calcium 8.5 (*)    Albumin 3.1 (*)    GFR, Estimated 56 (*)    All other components within normal limits  CBC - Abnormal; Notable for the following components:   WBC 3.9 (*)    Hemoglobin 11.4 (*)    HCT 35.3 (*)    RDW 15.7 (*)    All other components within normal limits  CULTURE, BLOOD (ROUTINE X 2)  CULTURE, BLOOD (ROUTINE X 2)  PROTIME-INR  URINALYSIS, ROUTINE W REFLEX MICROSCOPIC  POC OCCULT BLOOD, ED  TYPE AND SCREEN  TYPE AND SCREEN  TYPE AND SCREEN     EKG Normal sinus rhythm, ventricular rate 92.  PR 167, QRS 67,  QTc 452.  No acute ST elevations or depressions.   RADIOLOGY CT abdomen/pelvis: Acute, uncomplicated diverticulitis CT angio: Negative for PE, no other acute changes, the right lymphadenopathy suspicious for metastasis   I also independently reviewed and agree with radiologist interpretations.   PROCEDURES:  Critical Care performed: Yes, see critical care procedure note(s)  .Critical Care  Performed by: Duffy Bruce, MD Authorized by: Duffy Bruce, MD   Critical care provider statement:    Critical care time (minutes):  30   Critical care was necessary to treat or prevent imminent or life-threatening deterioration of the following conditions:  Circulatory failure, cardiac failure and sepsis   Critical care was time spent personally by me on the following activities:  Development of treatment plan with patient or surrogate, discussions with consultants, evaluation of patient's response to treatment, examination of patient, ordering and review of laboratory studies, ordering and review of radiographic studies, ordering and performing treatments and interventions, pulse oximetry,  re-evaluation of patient's condition and review of old charts     MEDICATIONS ORDERED IN ED: Medications  piperacillin-tazobactam (ZOSYN) IVPB 3.375 g (3.375 g Intravenous New Bag/Given 10/15/21 2100)  sodium chloride 0.9 % bolus 1,000 mL (0 mLs Intravenous Stopped 10/15/21 2105)  morphine (PF) 4 MG/ML injection 4 mg (4 mg Intravenous Given 10/15/21 1830)  ondansetron (ZOFRAN) injection 4 mg (4 mg Intravenous Given 10/15/21 1830)  acetaminophen (TYLENOL) tablet 1,000 mg (1,000 mg Oral Given 10/15/21 1831)  iohexol (OMNIPAQUE) 350 MG/ML injection 100 mL (100 mLs Intravenous Contrast Given 10/15/21 1846)  fentaNYL (SUBLIMAZE) injection 50 mcg (50 mcg Intravenous Given 10/15/21 2043)     IMPRESSION / MDM / [place] / ED COURSE  I reviewed the triage vital signs and the nursing notes.                               The patient is on the cardiac monitor to evaluate for evidence of arrhythmia and/or significant heart rate changes.   Ddx:  Differential includes the following, with pertinent life- or limb-threatening emergencies considered:  Sepsis, primary concern for possible diverticulitis, pyelonephritis, nephrolithiasis, pneumonia, left-sided PE, radiculopathy, zoster  Patient's presentation is most consistent with acute presentation with potential threat to life or bodily function.  MDM:  76 year old male with history of lung cancer on chemotherapy as well as radiation here with left-sided, severe abdominal pain.  Patient arrives mildly hypotensive and dehydrated clinically with mild creatinine elevation.  Bicarb 19 likely from dehydration.  White count 3.9, likely related to his chemotherapy.  Initial differential broad as above, CT of the chest as well as abdomen and pelvis obtained and reviewed by me.  No evidence of PE or pneumonia.  CT abdomen and pelvis shows acute, uncomplicated diverticulitis.  This correlates with his area of pain.  Given his chemotherapy status, ongoing  pain, and age, will admit for IV antibiotics and fluids.  Patient is in agreement with this plan.   MEDICATIONS GIVEN IN ED: Medications  piperacillin-tazobactam (ZOSYN) IVPB 3.375 g (3.375 g Intravenous New Bag/Given 10/15/21 2100)  sodium chloride 0.9 % bolus 1,000 mL (0 mLs Intravenous Stopped 10/15/21 2105)  morphine (PF) 4 MG/ML injection 4 mg (4 mg Intravenous Given 10/15/21 1830)  ondansetron (ZOFRAN) injection 4 mg (4 mg Intravenous Given 10/15/21 1830)  acetaminophen (TYLENOL) tablet 1,000 mg (1,000 mg Oral Given 10/15/21 1831)  iohexol (OMNIPAQUE) 350 MG/ML injection 100 mL (100 mLs Intravenous Contrast Given 10/15/21  1846)  fentaNYL (SUBLIMAZE) injection 50 mcg (50 mcg Intravenous Given 10/15/21 2043)     Consults:     EMR reviewed  Reviewed prior oncology notes, medical records from PCP.     FINAL CLINICAL IMPRESSION(S) / ED DIAGNOSES   Final diagnoses:  Acute diverticulitis     Rx / DC Orders   ED Discharge Orders     None        Note:  This document was prepared using Dragon voice recognition software and may include unintentional dictation errors.   Duffy Bruce, MD 10/15/21 2119

## 2021-10-15 NOTE — ED Triage Notes (Signed)
Pt with c/o left flank pain and noticing blood when he wipes his rectum after using the bathroom. Pt denies urinary symptoms and denies blood in urine. Pt states he has been having loose stool. Pt is currently under treatment for lung cancer and gets chemo and radiation.

## 2021-10-15 NOTE — Assessment & Plan Note (Addendum)
On antineoplastic chemotherapy and XRT Leukopenia and anemia No acute related issues CT chest negative for PE Continue to monitor cell lines Follow-up with oncology as an outpatient

## 2021-10-15 NOTE — Assessment & Plan Note (Addendum)
Continue dual antiplatelet therapy with aspirin and Plavix

## 2021-10-16 ENCOUNTER — Inpatient Hospital Stay: Payer: Medicare Other

## 2021-10-16 ENCOUNTER — Inpatient Hospital Stay: Payer: Medicare Other | Admitting: Medical Oncology

## 2021-10-16 ENCOUNTER — Ambulatory Visit: Payer: Medicare Other

## 2021-10-16 ENCOUNTER — Encounter: Payer: Self-pay | Admitting: *Deleted

## 2021-10-16 DIAGNOSIS — K5792 Diverticulitis of intestine, part unspecified, without perforation or abscess without bleeding: Secondary | ICD-10-CM | POA: Diagnosis not present

## 2021-10-16 DIAGNOSIS — N390 Urinary tract infection, site not specified: Secondary | ICD-10-CM | POA: Diagnosis present

## 2021-10-16 LAB — CBG MONITORING, ED
Glucose-Capillary: 93 mg/dL (ref 70–99)
Glucose-Capillary: 102 mg/dL — ABNORMAL HIGH (ref 70–99)

## 2021-10-16 LAB — BASIC METABOLIC PANEL
Anion gap: 5 (ref 5–15)
BUN: 17 mg/dL (ref 8–23)
CO2: 23 mmol/L (ref 22–32)
Calcium: 8 mg/dL — ABNORMAL LOW (ref 8.9–10.3)
Chloride: 104 mmol/L (ref 98–111)
Creatinine, Ser: 1.12 mg/dL (ref 0.61–1.24)
GFR, Estimated: >60 mL/min (ref >60)
Glucose, Bld: 106 mg/dL — ABNORMAL HIGH (ref 70–99)
Potassium: 3.8 mmol/L (ref 3.5–5.1)
Sodium: 132 mmol/L — ABNORMAL LOW (ref 135–145)

## 2021-10-16 LAB — CBC
HCT: 30.2 % — ABNORMAL LOW (ref 39.0–52.0)
Hemoglobin: 9.9 g/dL — ABNORMAL LOW (ref 13.0–17.0)
MCH: 27.2 pg (ref 26.0–34.0)
MCHC: 32.8 g/dL (ref 30.0–36.0)
MCV: 83 fL (ref 80.0–100.0)
Platelets: 203 10*3/uL (ref 150–400)
RBC: 3.64 MIL/uL — ABNORMAL LOW (ref 4.22–5.81)
RDW: 15.5 % (ref 11.5–15.5)
WBC: 2.7 10*3/uL — ABNORMAL LOW (ref 4.0–10.5)
nRBC: 0 % (ref 0.0–0.2)

## 2021-10-16 LAB — GLUCOSE, CAPILLARY
Glucose-Capillary: 106 mg/dL — ABNORMAL HIGH (ref 70–99)
Glucose-Capillary: 98 mg/dL (ref 70–99)

## 2021-10-16 LAB — TYPE AND SCREEN
ABO/RH(D): AB POS
Antibody Screen: NEGATIVE

## 2021-10-16 MED ORDER — METRONIDAZOLE 500 MG/100ML IV SOLN
500.0000 mg | Freq: Two times a day (BID) | INTRAVENOUS | Status: DC
  Administered 2021-10-16 – 2021-10-17 (×3): 500 mg via INTRAVENOUS
  Filled 2021-10-16 (×3): qty 100

## 2021-10-16 MED ORDER — SODIUM CHLORIDE 0.9 % IV SOLN
2.0000 g | INTRAVENOUS | Status: DC
  Administered 2021-10-16 – 2021-10-17 (×2): 2 g via INTRAVENOUS
  Filled 2021-10-16: qty 20
  Filled 2021-10-16: qty 2

## 2021-10-16 NOTE — Progress Notes (Signed)
Progress Note   Patient: Luke Rojas:[id number] DOB: 02-15-1946 DOA: 10/15/2021     1 DOS: the patient was seen and examined on 10/16/2021   Brief hospital course: 08/14-admitted to the hospital for acute diverticulitis continues to have left lower quadrant pain but improved from admission  Assessment and Plan: * Acute diverticulitis Presents for evaluation of left lower quadrant pain Imaging shows acute uncomplicated diverticulitis of the descending colon Supportive care with pain control, antiemetics and IV fluids Clear liquids We will switch patient to Rocephin and Flagyl  Hyponatremia Stable  Metabolic acidosis Likely secondary to diarrhea with bicarb of 19 on admission.   Resolved with IV fluid resuscitation    S/p right Internal carotid artery stent 09/11/21 Continue dual antiplatelet therapy with aspirin and Plavix  UTI (urinary tract infection) Patient noted to have pyuria Chart review from 06/22 shows a urine culture that yielded E. coli which was resistant to Zosyn. 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 On aspirin and Plavix which we will continue Continue atorvastatin and metoprolol and ramipril        Subjective: Patient is seen and  examined at bedside, continues to complain of left lower quadrant pain which he rates a 6 x 10 in intensity compared to a 10 x 10 on admission.  Physical Exam: Vitals:   10/16/21 1200 10/16/21 1230 10/16/21 1300 10/16/21 1340  BP: (!) 113/54 116/62 120/72 (!) 137/49  Pulse: 71 72 73 76  Resp: 20 16 (!) 23 18  Temp:    98.1 F (36.7 C)  TempSrc:    Oral  SpO2: 98% 100% 100% 93%  Weight:      Height:       Physical Exam Vitals and nursing note reviewed.  Constitutional:      Appearance: Normal appearance.  HENT:     Head: Normocephalic and atraumatic.     Nose: Nose normal.     Mouth/Throat:     Mouth: Mucous membranes are moist.  Eyes:     Comments: Pale conjunctiva,  Cardiovascular:     Rate and Rhythm: Normal rate and regular rhythm.  Pulmonary:     Effort: Pulmonary effort is normal.     Breath sounds: Normal breath sounds.  Abdominal:     General: Abdomen is flat.     Palpations: Abdomen is soft.     Tenderness: There is abdominal tenderness.     Comments: Tender in left lower quadrant  Musculoskeletal:        General: Normal range of motion.     Cervical back: Normal range of motion and neck supple.  Skin:    General: Skin is warm and dry.  Neurological:     General: No focal deficit present.     Mental Status: He is alert.  Psychiatric:        Mood and Affect: Mood normal.  Behavior: Behavior normal.     Data Reviewed: Relevant notes from primary care and specialist visits, past discharge summaries as available in EHR, including Care Everywhere. Prior diagnostic testing as pertinent to current admission diagnoses Updated medications and problem lists for reconciliation ED course, including vitals, labs, imaging, treatment and response to treatment Triage notes, nursing and pharmacy notes and ED provider's notes Notable results as noted in HPI Labs reviewed.  Stable There are no new results to review at this time.  Family Communication: Greater  than 50% of time was spent discussing patient's condition and plan of care with him and his wife at the bedside. Notified oncology about patient's hospitalization and the need to reschedule his chemotherapy  Disposition: Status is: Inpatient Remains inpatient appropriate because: Continues to require IV antibiotic therapy for acute diverticulitis  Planned Discharge Destination: Home    Time spent: 30 minutes  Author: Collier Bullock, MD 10/16/2021 1:55 PM  For on call review [email or web address].

## 2021-10-16 NOTE — ED Notes (Signed)
0942 Nurse attempted to contact patient wife but was unsuccessful voice message left requesting that per patient she brings a change of clothes and his personal cell phone. Patient informed of attempt to contact spouse.

## 2021-10-16 NOTE — Assessment & Plan Note (Signed)
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

## 2021-10-16 NOTE — Progress Notes (Signed)
This encounter was created in error - please disregard.

## 2021-10-16 NOTE — ED Notes (Signed)
Was told at change of shift that type and screen had been drawn twice by day shift and both hemolyzed. Type and screen needed, so lab was contacted to have phlebotomy to come and draw type and screen. Was told that it might be a few hours due to phlebotomy being up on the floors drawing morning labs.

## 2021-10-16 NOTE — Assessment & Plan Note (Addendum)
Patient noted to have pyuria Chart review from 06/22 shows a urine culture that yielded E. coli which was resistant to Zosyn. Patient was treated empirically with Rocephin and will be discharged home on a course of Augmentin

## 2021-10-17 ENCOUNTER — Encounter: Payer: Self-pay | Admitting: Dietician

## 2021-10-17 ENCOUNTER — Ambulatory Visit: Payer: Medicare Other

## 2021-10-17 ENCOUNTER — Other Ambulatory Visit (INDEPENDENT_AMBULATORY_CARE_PROVIDER_SITE_OTHER): Payer: Self-pay | Admitting: Vascular Surgery

## 2021-10-17 DIAGNOSIS — K5792 Diverticulitis of intestine, part unspecified, without perforation or abscess without bleeding: Secondary | ICD-10-CM | POA: Diagnosis not present

## 2021-10-17 DIAGNOSIS — I6521 Occlusion and stenosis of right carotid artery: Secondary | ICD-10-CM

## 2021-10-17 LAB — CBC
HCT: 34.5 % — ABNORMAL LOW (ref 39.0–52.0)
Hemoglobin: 11.3 g/dL — ABNORMAL LOW (ref 13.0–17.0)
MCH: 26.7 pg (ref 26.0–34.0)
MCHC: 32.8 g/dL (ref 30.0–36.0)
MCV: 81.6 fL (ref 80.0–100.0)
Platelets: 211 10*3/uL (ref 150–400)
RBC: 4.23 MIL/uL (ref 4.22–5.81)
RDW: 15.8 % — ABNORMAL HIGH (ref 11.5–15.5)
WBC: 3.9 10*3/uL — ABNORMAL LOW (ref 4.0–10.5)
nRBC: 0 % (ref 0.0–0.2)

## 2021-10-17 LAB — BASIC METABOLIC PANEL
Anion gap: 7 (ref 5–15)
BUN: 9 mg/dL (ref 8–23)
CO2: 23 mmol/L (ref 22–32)
Calcium: 8.4 mg/dL — ABNORMAL LOW (ref 8.9–10.3)
Chloride: 104 mmol/L (ref 98–111)
Creatinine, Ser: 0.97 mg/dL (ref 0.61–1.24)
GFR, Estimated: >60 mL/min (ref >60)
Glucose, Bld: 89 mg/dL (ref 70–99)
Potassium: 4.1 mmol/L (ref 3.5–5.1)
Sodium: 134 mmol/L — ABNORMAL LOW (ref 135–145)

## 2021-10-17 LAB — GLUCOSE, CAPILLARY
Glucose-Capillary: 71 mg/dL (ref 70–99)
Glucose-Capillary: 198 mg/dL — ABNORMAL HIGH (ref 70–99)

## 2021-10-17 MED ORDER — AMOXICILLIN-POT CLAVULANATE 875-125 MG PO TABS: 1.0000 | ORAL_TABLET | Freq: Two times a day (BID) | ORAL | 0 refills | Status: AC

## 2021-10-17 MED ORDER — POLYETHYLENE GLYCOL 3350 17 G PO PACK: 17.0000 g | PACK | Freq: Every day | ORAL | 0 refills | Status: AC

## 2021-10-17 MED ORDER — TRAMADOL HCL 50 MG PO TABS: 50.0000 mg | ORAL_TABLET | Freq: Four times a day (QID) | ORAL | Status: DC | PRN

## 2021-10-17 NOTE — Progress Notes (Signed)
Nutrition Education Note  RD consulted for nutrition education regarding a low fiber diet for diverticulitis.  RD provided "Nutrition and Diverticulitis" handout with supporting information. Reviewed patient's dietary recall. Provided examples of low and high fiber foods. Discouraged intake of high fiber foods, high fat foods, spicy foods, processed foods, caffeine and red meats when having a flare. Encouraged pt to cook foods until they are soft and chew foods well to help aid in digestion. Also recommend frequent small meals. Encouraged use of a multi-vitamin and protein supplements while having a flare.    RD encourage intake of high fiber foods when not having a flare.   Expect good compliance.  Current diet order is soft diet, patient is consuming approximately 100% of meals at this time. Labs and medications reviewed. No further nutrition interventions warranted at this time. RD contact information provided. If additional nutrition issues arise, please re-consult RD.  Luke Distance MS, RD, LDN Please refer to [facility] for RD and/or RD on-call/weekend/after hours pager

## 2021-10-17 NOTE — Progress Notes (Signed)
Mobility Specialist - Progress Note   10/17/21 1100  Mobility  Activity Ambulated with assistance in hallway  Level of Assistance Standby assist, set-up cues, supervision of patient - no hands on  Assistive Device None  Distance Ambulated (ft) 360 ft  Activity Response Tolerated well  $Mobility charge 1 Mobility     Pt lying in bed upon arrival, utilizing RA. Pt able to complete bed mobility and don shoes independently. Ambulated in hallway with supervision, fast gait but no LOB. Denied pain, SOB, and dizziness. Pt returned to bed with needs in reach, spouse at bedside.    Kathee Delton Mobility Specialist 10/17/21, 11:37 AM

## 2021-10-17 NOTE — Progress Notes (Signed)
Mobility Specialist - Progress Note    10/17/21 1400  Mobility  Activity Ambulated with assistance in hallway  Level of Assistance Standby assist, set-up cues, supervision of patient - no hands on  Assistive Device None  Distance Ambulated (ft) 100 ft  Activity Response Tolerated well  $Mobility charge 1 Mobility    Merrily Brittle Mobility Specialist 10/17/21, 2:49 PM

## 2021-10-17 NOTE — TOC Initial Note (Signed)
Transition of Care [facility]) - Initial/Assessment Note    Patient Details  Name: Luke Rojas MRN: [id number] Date of Birth: 07-16-1945  Transition of Care [facility] CM/SW Contact:    Beverly Sessions, RN Phone Number: 10/17/2021, 2:39 PM  Clinical Narrative:                    Admitted PYK:[id number] Admitted from: home with wife PCP: [facility] Current home health/prior home health/DME: NA  Patient states he does not have any needs at discharge.  Drives him self to most appointments. And wife transport to [facility].   Patient to discharge today       Patient Goals and CMS Choice        Expected Discharge Plan and Services           Expected Discharge Date: 10/17/21                                    Prior Living Arrangements/Services                       Activities of Daily Living Home Assistive Devices/Equipment: Eyeglasses, Kasandra Knudsen (specify quad or straight) ADL Screening (condition at time of admission) Patient's cognitive ability adequate to safely complete daily activities?: Yes Is the patient deaf or have difficulty hearing?: No Does the patient have difficulty seeing, even when wearing glasses/contacts?: No Does the patient have difficulty concentrating, remembering, or making decisions?: No Patient able to express need for assistance with ADLs?: Yes Does the patient have difficulty dressing or bathing?: No Independently performs ADLs?: Yes (appropriate for developmental age) Does the patient have difficulty walking or climbing stairs?: No Weakness of Legs: None Weakness of Arms/Hands: None  Permission Sought/Granted                  Emotional Assessment              Admission diagnosis:  Acute diverticulitis [K57.92] Patient Active Problem List   Diagnosis Date Noted   UTI (urinary tract infection) 10/16/2021   COPD (chronic obstructive pulmonary disease) ([place])    Acute diverticulitis    S/p right Internal carotid  artery stent 09/11/21    On antineoplastic chemotherapy    Metabolic acidosis    Hyponatremia    Malignant neoplasm of upper lobe of right lung ([place]) 09/18/2021   Goals of care, counseling/discussion 09/18/2021   Carotid stenosis, right 09/11/2021   Carotid stenosis 08/29/2021   Lung mass 08/29/2021   Diverticulosis 07/17/2021   Senile purpura ([place]) 07/17/2021   Diabetes mellitus without complication ([place]) 73/41/9379   Hyperlipidemia associated with type 2 diabetes mellitus ([place]) 04/04/2021   Hypertension associated with diabetes ([place]) 03/15/2021   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 [place] [address] Phone: [phone] Fax: [phone]     Social Determinants of Health (SDOH) Interventions    Readmission Risk Interventions     No data to display

## 2021-10-17 NOTE — Discharge Summary (Addendum)
Physician Discharge Summary   Patient: Luke Rojas MRN: [id number] DOB: 1945-09-25  Admit date:     10/15/2021  Discharge date: 10/25/21  Discharge Physician: Salvator Seppala   PCP: Virginia Crews, MD   Recommendations at discharge:   Complete antibiotic therapy as recommended Return to emergency room for worsening symptoms  Check blood sugars daily  Discharge Diagnoses: Principal Problem:   Acute diverticulitis Active Problems:   Metabolic acidosis   Hyponatremia   S/p right Internal carotid artery stent 09/11/21   CAD (coronary artery disease)   Diabetes mellitus without complication ([place])   Malignant neoplasm of upper lobe of right lung (HCC)   COPD (chronic obstructive pulmonary disease) ([place])   On antineoplastic chemotherapy   UTI (urinary tract infection)   Anemia associated with chemotherapy  Resolved Problems:   * No resolved hospital problems. [facility] Course:  Luke Rojas is a 76 y.o. male with medical history significant for COPD, DM, CAD, s/p right ICA stent placement for high-grade carotid artery stenosis on 09/11/2021, recently diagnosed right lung cancer initiated on XRT and chemo on 09/22/2021, who presented to the ED with a several day history of left-sided flank pain that had been progressively increasing in severity.  It was associated with watery diarrhea and he had noted small amounts of blood when he wipes.  He denied having any fever or chills.  Had nausea but no vomiting ED course and data review: On arrival BP 90/49 with pulse of 101.  Afebrile, respirations 22 and O2 sat 99% on room air. Labs: WBC 3.9, hemoglobin 11.4 at/near baseline 11.6-12, platelets 266.  Creatinine 1.32 up from baseline of 1.05 with CO2 of 19 Imaging: CT abdomen and pelvis showed acute uncomplicated diverticulitis of the descending colon CTA chest negative for PE.  Shows unchanged 4.8 cm right upper lobe mass consistent with primary bronchogenic carcinoma.    Patient treated with fentanyl, morphine, Zofran and an IV fluid bolus.  Started on Zosyn.  Hospitalist consulted for admission.      Assessment and Plan: * Acute diverticulitis Presented for evaluation of left lower quadrant pain Imaging shows acute uncomplicated diverticulitis of the descending colon Patient's symptoms have improved and he was able to tolerate a diet. Has not required any further doses of IV pain medication over the last 8 hours. Patient was treated with IV Rocephin and Flagyl and will be discharged home on Augmentin.   Hyponatremia Improved  Metabolic acidosis Likely secondary to diarrhea with bicarb of 19 on admission.   Resolved with IV fluid resuscitation    S/p right Internal carotid artery stent 09/11/21 Continue dual antiplatelet therapy with aspirin and Plavix  UTI (urinary tract infection) Patient noted to have pyuria Chart review from 06/22 shows a urine culture that yielded E. coli which was resistant to Zosyn. 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 Follow-up with oncology as an outpatient  Diabetes  mellitus without complication (HCC) Blood sugars are stable Maintain consistent carbohydrate diet Continue metformin  CAD (coronary artery disease) No complaints of chest pain. On aspirin and  Plavix which we will continue Continue atorvastatin and metoprolol and ramipril         Consultants: None Procedures performed: None Disposition: Home Diet recommendation:  Discharge Diet Orders (From admission, onward)     Start     Ordered   10/17/21 0000  Diet - low sodium heart healthy        10/17/21 1353           Carb modified diet DISCHARGE MEDICATION: Allergies as of 10/17/2021       Reactions   Codeine Nausea Only, Nausea And Vomiting, Other (See Comments)   Other reaction(s): Vomiting        Medication List     STOP taking these medications    dexamethasone 4 MG tablet Commonly known as: DECADRON   nitroGLYCERIN 0.4 MG SL tablet Commonly known as: Nitrostat   ondansetron 4 MG disintegrating tablet Commonly known as: ZOFRAN-ODT   ondansetron 8 MG tablet Commonly known as: Zofran   prochlorperazine 10 MG tablet Commonly known as: COMPAZINE   Uribel 118 MG Caps       TAKE these medications    amLODipine 10 MG tablet Commonly known as: NORVASC TAKE 1 TABLET BY MOUTH ONCE A DAY   aspirin EC 81 MG tablet Take 1 tablet (81 mg total) by mouth daily. Swallow whole.   atorvastatin 80 MG tablet Commonly known as: LIPITOR TAKE 1 TABLET BY MOUTH EVERY NIGHT AT BEDTIME   clopidogrel 75 MG tablet Commonly known as: PLAVIX Take 75 mg by mouth daily.   co-enzyme Q-10 30 MG capsule Take 30 mg by mouth daily.   diclofenac sodium 1 % Gel Commonly known as: VOLTAREN Apply 2 g topically 4 (four) times daily.   docusate sodium 100 MG capsule Commonly known as: Colace Take 1 capsule (100 mg total) by mouth 2 (two) times daily.   Krill Oil 1000 MG Caps Take 1,000 mg by mouth daily.   lidocaine-prilocaine cream Commonly known as: EMLA Apply to affected area once   metFORMIN 750 MG 24 hr tablet Commonly known as: GLUCOPHAGE-XR TAKE 1 TABLET BY MOUTH ONCE A DAY WITH BREAKFAST   metoprolol tartrate 25 MG tablet Commonly known as:  LOPRESSOR Take 1 tablet (25 mg total) by mouth 2 (two) times daily.   MULTIPLE VITAMIN PO Take 1 tablet by mouth daily.   OneTouch Delica Lancets 51O Misc TEST FASTING GLUCOSE LEVEL EACH MORNING BEFORE BREAKFAST   oxyCODONE-acetaminophen 5-325 MG tablet Commonly known as: PERCOCET/ROXICET Take 1 tablet by mouth every 4 (four) hours as needed for moderate pain.   pantoprazole 40 MG tablet Commonly known as: PROTONIX Take 40 mg by mouth daily.   polyethylene glycol 17 g packet Commonly known as: MIRALAX / GLYCOLAX Take 17 g by mouth daily.   ramipril 10 MG capsule Commonly known as: ALTACE Take 1 capsule (10 mg total) by mouth daily.   tamsulosin 0.4 MG Caps capsule Commonly known as: FLOMAX Take 0.4 mg by mouth daily.       ASK your doctor about these medications    amoxicillin-clavulanate 875-125 MG tablet Commonly known as: AUGMENTIN Take 1 tablet by mouth 2 (two) times daily for 5 days. Ask about: Should I take this medication?        Follow-up Information     Bacigalupo, Dionne Bucy, MD Follow  up in 5 day(s).   Specialty: Family Medicine Contact information: [address] [place] [address] [phone]                Discharge Exam: Danley Danker Weights   10/15/21 1658  Weight: 74.8 kg   Vitals and nursing note reviewed.  Constitutional:      Appearance: Normal appearance.  HENT:     Head: Normocephalic and atraumatic.     Nose: Nose normal.     Mouth/Throat:     Mouth: Mucous membranes are moist.  Eyes:     Comments: Pale conjunctiva,  Cardiovascular:     Rate and Rhythm: Normal rate and regular rhythm.  Pulmonary:     Effort: Pulmonary effort is normal.     Breath sounds: Normal breath sounds.  Abdominal:     General: Abdomen is flat.     Palpations: Abdomen is soft.     Tenderness: There is abdominal tenderness improved from admission    Comments: Tender in left lower quadrant.  Improved Musculoskeletal:         General: Normal range of motion.     Cervical back: Normal range of motion and neck supple.  Skin:    General: Skin is warm and dry.  Neurological:     General: No focal deficit present.     Mental Status: He is alert.  Psychiatric:        Mood and Affect: Mood normal.        Behavior: Behavior normal.     Condition at discharge: stable  The results of significant diagnostics from this hospitalization (including imaging, microbiology, ancillary and laboratory) are listed below for reference.   Imaging Studies: VAS US CAROTID  Result Date: 10/25/2021 Carotid Arterial Duplex Study Patient Name:  LARNELL GRANLUND  Date of Exam:   10/18/2021 Medical Rec #: [id number]           Accession #:    [id number] Date of Birth: Jan 17, 1946          Patient Gender: M Patient Age:   3 years Exam Location:  [facility] Procedure:      VAS US CAROTID Referring Phys: Leotis Pain --------------------------------------------------------------------------------  Indications:  Carotid artery disease and left endarterectomy. Risk Factors: Hypertension, hyperlipidemia, Diabetes, past history of smoking,               coronary artery disease. Performing Technologist: Delorise Shiner RVT  Examination Guidelines: A complete evaluation includes B-mode imaging, spectral Doppler, color Doppler, and power Doppler as needed of all accessible portions of each vessel. Bilateral testing is considered an integral part of a complete examination. Limited examinations for reoccurring indications may be performed as noted.  Right Carotid Findings: +----------+--------+--------+--------+------------------+--------+           PSV cm/sEDV cm/sStenosisPlaque DescriptionComments +----------+--------+--------+--------+------------------+--------+ CCA Prox  70      0                                          +----------+--------+--------+--------+------------------+--------+ CCA Mid   77      13                                          +----------+--------+--------+--------+------------------+--------+ CCA Distal79      15  stent    +----------+--------+--------+--------+------------------+--------+ ICA Prox  70      17                                stent    +----------+--------+--------+--------+------------------+--------+ ICA Mid   73      23                                         +----------+--------+--------+--------+------------------+--------+ ICA Distal96      26                                         +----------+--------+--------+--------+------------------+--------+ ECA       167     13                                         +----------+--------+--------+--------+------------------+--------+ +----------+--------+-------+----------------+-------------------+           PSV cm/sEDV cmsDescribe        Arm Pressure (mmHG) +----------+--------+-------+----------------+-------------------+ Subclavian112     0      Multiphasic, WNL                    +----------+--------+-------+----------------+-------------------+ +---------+--------+--+--------+-+---------+ VertebralPSV cm/s47EDV cm/s8Antegrade +---------+--------+--+--------+-+---------+  Right Stent(s): +----------------+--------+--------+--------+--------+--------+ Distal CCA - ICAPSV cm/sEDV cm/sStenosisWaveformComments +----------------+--------+--------+--------+--------+--------+ Prox to Stent   84      17                               +----------------+--------+--------+--------+--------+--------+ Proximal Stent  68      13                               +----------------+--------+--------+--------+--------+--------+ Mid Stent       62      10                               +----------------+--------+--------+--------+--------+--------+ Distal Stent    58      14                                +----------------+--------+--------+--------+--------+--------+ Distal to Stent 72      23                               +----------------+--------+--------+--------+--------+--------+   Left Carotid Findings: +----------+--------+--------+--------+------------------+--------+           PSV cm/sEDV cm/sStenosisPlaque DescriptionComments +----------+--------+--------+--------+------------------+--------+ CCA Prox  103     16                                         +----------+--------+--------+--------+------------------+--------+ CCA Mid   104     16                                         +----------+--------+--------+--------+------------------+--------+  CCA Distal89      19                                         +----------+--------+--------+--------+------------------+--------+ ICA Prox  75      26      1-39%   smooth                     +----------+--------+--------+--------+------------------+--------+ ICA Mid   88      26                                         +----------+--------+--------+--------+------------------+--------+ ICA Distal75      25                                         +----------+--------+--------+--------+------------------+--------+ ECA       138     0                                          +----------+--------+--------+--------+------------------+--------+ +----------+--------+--------+----------------+-------------------+           PSV cm/sEDV cm/sDescribe        Arm Pressure (mmHG) +----------+--------+--------+----------------+-------------------+ Subclavian122     0       Multiphasic, WNL                    +----------+--------+--------+----------------+-------------------+ +---------+--------+--+--------+--+---------+ VertebralPSV cm/s72EDV cm/s18Antegrade +---------+--------+--+--------+--+---------+   Summary: Right Carotid: There is no evidence of stenosis in the right ICA. Patent ICA                 stent. Left Carotid: Velocities in the left ICA are consistent with a 1-39% stenosis. Vertebrals:  Bilateral vertebral arteries demonstrate antegrade flow. Subclavians: Normal flow hemodynamics were seen in bilateral subclavian              arteries. *See table(s) above for measurements and observations.  Electronically signed by Leotis Pain MD on 10/25/2021 at 8:16:58 AM.    Final    CT Angio Chest PE W and/or Wo Contrast  Result Date: 10/15/2021 CLINICAL DATA:  High probability pulmonary embolism, left flank pain EXAM: CT ANGIOGRAPHY CHEST CT ABDOMEN AND PELVIS WITH CONTRAST TECHNIQUE: Multidetector CT imaging of the chest was performed using the standard protocol during bolus administration of intravenous contrast. Multiplanar CT image reconstructions and MIPs were obtained to evaluate the vascular anatomy. Multidetector CT imaging of the abdomen and pelvis was performed using the standard protocol during bolus administration of intravenous contrast. RADIATION DOSE REDUCTION: This exam was performed according to the departmental dose-optimization program which includes automated exposure control, adjustment of the mA and/or kV according to patient size and/or use of iterative reconstruction technique. CONTRAST:  141mL OMNIPAQUE IOHEXOL 350 MG/ML SOLN COMPARISON:  PET/CT 08/29/2021 and CT abdomen and pelvis 06/19/2021 FINDINGS: CTA CHEST FINDINGS Cardiovascular: Satisfactory opacification of the pulmonary arteries to the segmental level. No evidence of pulmonary embolism. Cardiomegaly. No pericardial effusion. Coronary stenting and calcification. Aortic atherosclerotic calcification. Mediastinum/Nodes: Right hilar and subcarinal adenopathy similar in size to 08/29/2021 where they demonstrated increased FDG avidity. Normal thyroid. Unremarkable  esophagus. The central airways are patent. Lungs/Pleura: Irregular right upper lobe mass now measures 4.3 x 4.4 cm, not significantly changed from 08/29/2021.  Scarring/atelectasis in the lung bases. Emphysema. No pleural effusion or pneumothorax. Musculoskeletal: No chest wall abnormality. No acute or significant osseous findings. Review of the MIP images confirms the above findings. CT ABDOMEN and PELVIS FINDINGS Hepatobiliary: No suspicious focal liver abnormality is seen. No gallstones, gallbladder wall thickening, or biliary dilatation. Pancreas: Unremarkable. No pancreatic ductal dilatation or surrounding inflammatory changes. Spleen: Normal in size without focal abnormality. Adrenals/Urinary Tract: Adrenal glands are unremarkable. Kidneys are normal, without renal calculi, suspicious focal lesion, or hydronephrosis. Unchanged bilateral renal cysts not requiring follow-up. Bladder is unremarkable. Stomach/Bowel: Stomach and small bowel within normal limits. Colonic diverticulosis greatest within the descending colon. Wall thickening and adjacent inflammatory stranding about the descending colon compatible with diverticulitis. No drainable fluid collection. No free intraperitoneal air. Moderate colonic stool load. Normal appendix. Vascular/Lymphatic: Aortic atherosclerosis. No enlarged abdominal or pelvic lymph nodes. Reproductive: Enlarged prostate containing metal clips. Other: Small fat containing umbilical hernia. Musculoskeletal: No acute or significant osseous findings. Review of the MIP images confirms the above findings. IMPRESSION: Chest: 1. Negative for acute pulmonary embolism. 2. Unchanged size of the 4.8 cm right upper lobe mass consistent with primary bronchogenic carcinoma. 3. Similar right hilar and subcarinal lymphadenopathy suspicious for metastases. 4. Aortic Atherosclerosis (ICD10-I70.0) and Emphysema (ICD10-J43.9). Abdomen/pelvis: 1. Acute uncomplicated diverticulitis of the descending colon. Electronically Signed   By: Placido Sou M.D.   On: 10/15/2021 19:33   CT ABDOMEN PELVIS W CONTRAST  Result Date: 10/15/2021 CLINICAL DATA:  High  probability pulmonary embolism, left flank pain EXAM: CT ANGIOGRAPHY CHEST CT ABDOMEN AND PELVIS WITH CONTRAST TECHNIQUE: Multidetector CT imaging of the chest was performed using the standard protocol during bolus administration of intravenous contrast. Multiplanar CT image reconstructions and MIPs were obtained to evaluate the vascular anatomy. Multidetector CT imaging of the abdomen and pelvis was performed using the standard protocol during bolus administration of intravenous contrast. RADIATION DOSE REDUCTION: This exam was performed according to the departmental dose-optimization program which includes automated exposure control, adjustment of the mA and/or kV according to patient size and/or use of iterative reconstruction technique. CONTRAST:  167mL OMNIPAQUE IOHEXOL 350 MG/ML SOLN COMPARISON:  PET/CT 08/29/2021 and CT abdomen and pelvis 06/19/2021 FINDINGS: CTA CHEST FINDINGS Cardiovascular: Satisfactory opacification of the pulmonary arteries to the segmental level. No evidence of pulmonary embolism. Cardiomegaly. No pericardial effusion. Coronary stenting and calcification. Aortic atherosclerotic calcification. Mediastinum/Nodes: Right hilar and subcarinal adenopathy similar in size to 08/29/2021 where they demonstrated increased FDG avidity. Normal thyroid. Unremarkable esophagus. The central airways are patent. Lungs/Pleura: Irregular right upper lobe mass now measures 4.3 x 4.4 cm, not significantly changed from 08/29/2021. Scarring/atelectasis in the lung bases. Emphysema. No pleural effusion or pneumothorax. Musculoskeletal: No chest wall abnormality. No acute or significant osseous findings. Review of the MIP images confirms the above findings. CT ABDOMEN and PELVIS FINDINGS Hepatobiliary: No suspicious focal liver abnormality is seen. No gallstones, gallbladder wall thickening, or biliary dilatation. Pancreas: Unremarkable. No pancreatic ductal dilatation or surrounding inflammatory changes.  Spleen: Normal in size without focal abnormality. Adrenals/Urinary Tract: Adrenal glands are unremarkable. Kidneys are normal, without renal calculi, suspicious focal lesion, or hydronephrosis. Unchanged bilateral renal cysts not requiring follow-up. Bladder is unremarkable. Stomach/Bowel: Stomach and small bowel within normal limits. Colonic diverticulosis greatest within the descending colon. Wall thickening and adjacent inflammatory stranding about the descending colon compatible with diverticulitis. No  drainable fluid collection. No free intraperitoneal air. Moderate colonic stool load. Normal appendix. Vascular/Lymphatic: Aortic atherosclerosis. No enlarged abdominal or pelvic lymph nodes. Reproductive: Enlarged prostate containing metal clips. Other: Small fat containing umbilical hernia. Musculoskeletal: No acute or significant osseous findings. Review of the MIP images confirms the above findings. IMPRESSION: Chest: 1. Negative for acute pulmonary embolism. 2. Unchanged size of the 4.8 cm right upper lobe mass consistent with primary bronchogenic carcinoma. 3. Similar right hilar and subcarinal lymphadenopathy suspicious for metastases. 4. Aortic Atherosclerosis (ICD10-I70.0) and Emphysema (ICD10-J43.9). Abdomen/pelvis: 1. Acute uncomplicated diverticulitis of the descending colon. Electronically Signed   By: Placido Sou M.D.   On: 10/15/2021 19:33   PERIPHERAL VASCULAR CATHETERIZATION  Result Date: 09/25/2021 See surgical note for result.   Microbiology: Results for orders placed or performed during the hospital encounter of 10/15/21  Blood culture (routine x 2)     Status: None   Collection Time: 10/15/21  8:54 PM   Specimen: BLOOD  Result Value Ref Range Status   Specimen Description BLOOD LEFT WRIST  Final   Special Requests   Final    BOTTLES DRAWN AEROBIC AND ANAEROBIC Blood Culture results may not be optimal due to an inadequate volume of blood received in culture bottles   Culture    Final    NO GROWTH 5 DAYS Performed at [facility], [address], [place], [place] [address]    Report Status 10/20/2021 FINAL  Final  Blood culture (routine x 2)     Status: None   Collection Time: 10/15/21  8:54 PM   Specimen: BLOOD  Result Value Ref Range Status   Specimen Description BLOOD RIGHT HAND  Final   Special Requests   Final    BOTTLES DRAWN AEROBIC AND ANAEROBIC Blood Culture results may not be optimal due to an inadequate volume of blood received in culture bottles   Culture   Final    NO GROWTH 5 DAYS Performed at [facility], [address], [place], [place] [address]    Report Status 10/20/2021 FINAL  Final    Labs: CBC: Recent Labs  Lab 10/23/21 0917  WBC 3.2*  NEUTROABS 1.6*  HGB 10.8*  HCT 32.6*  MCV 83.2  PLT 801   Basic Metabolic Panel: Recent Labs  Lab 10/23/21 0917  NA 136  K 3.2*  CL 107  CO2 24  GLUCOSE 148*  BUN 17  CREATININE 1.16  CALCIUM 7.9*   Liver Function Tests: Recent Labs  Lab 10/23/21 0917  AST 39  ALT 72*  ALKPHOS 81  BILITOT 0.4  PROT 6.4*  ALBUMIN 3.1*   CBG: No results for input(s): "GLUCAP" in the last 168 hours.   Discharge time spent: greater than 30 minutes.  Signed: Collier Bullock, MD [facility] 10/25/2021

## 2021-10-17 NOTE — Progress Notes (Signed)
Pt discharged per MD order. IV removed. Discharge instructions reviewed with pt. Pt verbalized understanding. All questions answered to pt satisfaction. Pt taken out in wheelchair by staff.

## 2021-10-18 ENCOUNTER — Encounter (INDEPENDENT_AMBULATORY_CARE_PROVIDER_SITE_OTHER): Payer: Self-pay | Admitting: Nurse Practitioner

## 2021-10-18 ENCOUNTER — Ambulatory Visit (INDEPENDENT_AMBULATORY_CARE_PROVIDER_SITE_OTHER): Payer: Medicare Other | Admitting: Nurse Practitioner

## 2021-10-18 ENCOUNTER — Other Ambulatory Visit: Payer: Self-pay

## 2021-10-18 ENCOUNTER — Ambulatory Visit
Admission: RE | Admit: 2021-10-18 | Discharge: 2021-10-18 | Disposition: A | Discharge status: Home / Self Care | Payer: Medicare Other | Source: Ambulatory Visit | Attending: Radiation Oncology | Admitting: Radiation Oncology

## 2021-10-18 ENCOUNTER — Ambulatory Visit (INDEPENDENT_AMBULATORY_CARE_PROVIDER_SITE_OTHER): Payer: Medicare Other

## 2021-10-18 VITALS — BP 114/71 | HR 80 | Resp 18 | Ht 65.0 in | Wt 167.0 lb

## 2021-10-18 DIAGNOSIS — C3411 Malignant neoplasm of upper lobe, right bronchus or lung: Secondary | ICD-10-CM | POA: Diagnosis not present

## 2021-10-18 DIAGNOSIS — Z5111 Encounter for antineoplastic chemotherapy: Secondary | ICD-10-CM | POA: Diagnosis not present

## 2021-10-18 DIAGNOSIS — I6521 Occlusion and stenosis of right carotid artery: Secondary | ICD-10-CM

## 2021-10-18 DIAGNOSIS — R0789 Other chest pain: Secondary | ICD-10-CM | POA: Diagnosis not present

## 2021-10-18 DIAGNOSIS — D701 Agranulocytosis secondary to cancer chemotherapy: Secondary | ICD-10-CM | POA: Diagnosis not present

## 2021-10-18 DIAGNOSIS — Z87891 Personal history of nicotine dependence: Secondary | ICD-10-CM | POA: Diagnosis not present

## 2021-10-18 DIAGNOSIS — Z510 Encounter for antineoplastic radiation therapy: Secondary | ICD-10-CM | POA: Diagnosis not present

## 2021-10-18 LAB — RAD ONC ARIA SESSION SUMMARY
Course Elapsed Days: 14
Plan Fractions Treated to Date: 9
Plan Prescribed Dose Per Fraction: 2 Gy
Plan Total Fractions Prescribed: 35
Plan Total Prescribed Dose: 70 Gy
Reference Point Dosage Given to Date: 18 Gy
Reference Point Session Dosage Given: 2 Gy
Session Number: 9

## 2021-10-19 ENCOUNTER — Ambulatory Visit
Admission: RE | Admit: 2021-10-19 | Discharge: 2021-10-19 | Disposition: A | Discharge status: Home / Self Care | Payer: Medicare Other | Source: Ambulatory Visit | Attending: Radiation Oncology | Admitting: Radiation Oncology

## 2021-10-19 ENCOUNTER — Other Ambulatory Visit: Payer: Self-pay

## 2021-10-19 ENCOUNTER — Other Ambulatory Visit: Payer: Self-pay | Admitting: *Deleted

## 2021-10-19 DIAGNOSIS — K5732 Diverticulitis of large intestine without perforation or abscess without bleeding: Secondary | ICD-10-CM

## 2021-10-19 DIAGNOSIS — Z87891 Personal history of nicotine dependence: Secondary | ICD-10-CM | POA: Diagnosis not present

## 2021-10-19 DIAGNOSIS — R0789 Other chest pain: Secondary | ICD-10-CM | POA: Diagnosis not present

## 2021-10-19 DIAGNOSIS — D701 Agranulocytosis secondary to cancer chemotherapy: Secondary | ICD-10-CM | POA: Diagnosis not present

## 2021-10-19 DIAGNOSIS — C3411 Malignant neoplasm of upper lobe, right bronchus or lung: Secondary | ICD-10-CM | POA: Diagnosis not present

## 2021-10-19 DIAGNOSIS — Z5111 Encounter for antineoplastic chemotherapy: Secondary | ICD-10-CM | POA: Diagnosis not present

## 2021-10-19 DIAGNOSIS — Z510 Encounter for antineoplastic radiation therapy: Secondary | ICD-10-CM | POA: Diagnosis not present

## 2021-10-19 LAB — RAD ONC ARIA SESSION SUMMARY
Course Elapsed Days: 15
Plan Fractions Treated to Date: 10
Plan Prescribed Dose Per Fraction: 2 Gy
Plan Total Fractions Prescribed: 35
Plan Total Prescribed Dose: 70 Gy
Reference Point Dosage Given to Date: 20 Gy
Reference Point Session Dosage Given: 2 Gy
Session Number: 10

## 2021-10-19 NOTE — Patient Outreach (Signed)
  Care Coordination [facility] Note Transition Care Management Follow-up Telephone Call Date of discharge and from where: [phone] [facility] How have you been since you were released from the hospital? OK Any questions or concerns? No  Items Reviewed: Did the pt receive and understand the discharge instructions provided? Yes  Medications obtained and verified? Yes  Other? No  Any new allergies since your discharge? No  Dietary orders reviewed?N Do you have support at home? Yes   Home Care and Equipment/Supplies: Were home health services ordered? no If so, what is the name of the agency? n  Has the agency set up a time to come to the patient's home? not applicable Were any new equipment or medical supplies ordered?  No What is the name of the medical supply agency? N/a Were you able to get the supplies/equipment? not applicable Do you have any questions related to the use of the equipment or supplies? No  Functional Questionnaire: (I = Independent and D = Dependent) ADLs: I  Bathing/Dressing- I  Meal Prep- I  Eating- I  Maintaining continence- I  Transferring/Ambulation- I  Managing Meds- I  Follow up appointments reviewed:  PCP Hospital f/u appt confirmed? Yes  Scheduled to see  Rory Percy on [phone] 3 pm [facility] f/u appt confirmed? Yes  Scheduled to see Eulogio Ditch NP [phone] 10 am  Are transportation arrangements needed? No  If their condition worsens, is the pt aware to call PCP or go to the Emergency Dept.? Yes Was the patient provided with contact information for the PCP's office or ED? Yes Was to pt encouraged to call back with questions or concerns? Yes  SDOH assessments and interventions completed:   Yes  Care Coordination Interventions Activated:  Yes   Care Coordination Interventions:  Referred for Care Coordination Services:  [facility] Meal Plan     Encounter Outcome:  Pt. Visit Completed

## 2021-10-20 ENCOUNTER — Telehealth: Payer: Self-pay | Admitting: *Deleted

## 2021-10-20 ENCOUNTER — Ambulatory Visit
Admission: RE | Admit: 2021-10-20 | Discharge: 2021-10-20 | Disposition: A | Discharge status: Home / Self Care | Payer: Medicare Other | Source: Ambulatory Visit | Attending: Radiation Oncology | Admitting: Radiation Oncology

## 2021-10-20 ENCOUNTER — Other Ambulatory Visit: Payer: Self-pay

## 2021-10-20 DIAGNOSIS — D701 Agranulocytosis secondary to cancer chemotherapy: Secondary | ICD-10-CM | POA: Diagnosis not present

## 2021-10-20 DIAGNOSIS — Z87891 Personal history of nicotine dependence: Secondary | ICD-10-CM | POA: Diagnosis not present

## 2021-10-20 DIAGNOSIS — Z5111 Encounter for antineoplastic chemotherapy: Secondary | ICD-10-CM | POA: Diagnosis not present

## 2021-10-20 DIAGNOSIS — Z510 Encounter for antineoplastic radiation therapy: Secondary | ICD-10-CM | POA: Diagnosis not present

## 2021-10-20 DIAGNOSIS — R0789 Other chest pain: Secondary | ICD-10-CM | POA: Diagnosis not present

## 2021-10-20 DIAGNOSIS — C3411 Malignant neoplasm of upper lobe, right bronchus or lung: Secondary | ICD-10-CM | POA: Diagnosis not present

## 2021-10-20 LAB — RAD ONC ARIA SESSION SUMMARY
Course Elapsed Days: 16
Plan Fractions Treated to Date: 11
Plan Prescribed Dose Per Fraction: 2 Gy
Plan Total Fractions Prescribed: 35
Plan Total Prescribed Dose: 70 Gy
Reference Point Dosage Given to Date: 22 Gy
Reference Point Session Dosage Given: 2 Gy
Session Number: 11

## 2021-10-20 LAB — CULTURE, BLOOD (ROUTINE X 2)
Culture: NO GROWTH
Culture: NO GROWTH

## 2021-10-20 MED FILL — Dexamethasone Sodium Phosphate Inj 100 MG/10ML: INTRAMUSCULAR | Qty: 1 | Status: AC

## 2021-10-23 ENCOUNTER — Ambulatory Visit
Admission: RE | Admit: 2021-10-23 | Discharge: 2021-10-23 | Disposition: A | Discharge status: Home / Self Care | Payer: Medicare Other | Source: Ambulatory Visit | Attending: Radiation Oncology | Admitting: Radiation Oncology

## 2021-10-23 ENCOUNTER — Inpatient Hospital Stay: Payer: Medicare Other

## 2021-10-23 ENCOUNTER — Other Ambulatory Visit: Payer: Self-pay | Admitting: *Deleted

## 2021-10-23 ENCOUNTER — Encounter: Payer: Self-pay | Admitting: *Deleted

## 2021-10-23 ENCOUNTER — Other Ambulatory Visit: Payer: Self-pay

## 2021-10-23 VITALS — BP 106/57 | HR 64 | Temp 98.1°F | Resp 16 | Wt 167.0 lb

## 2021-10-23 DIAGNOSIS — C3411 Malignant neoplasm of upper lobe, right bronchus or lung: Secondary | ICD-10-CM | POA: Diagnosis not present

## 2021-10-23 DIAGNOSIS — E876 Hypokalemia: Secondary | ICD-10-CM

## 2021-10-23 DIAGNOSIS — Z87891 Personal history of nicotine dependence: Secondary | ICD-10-CM | POA: Diagnosis not present

## 2021-10-23 DIAGNOSIS — R0789 Other chest pain: Secondary | ICD-10-CM | POA: Diagnosis not present

## 2021-10-23 DIAGNOSIS — D701 Agranulocytosis secondary to cancer chemotherapy: Secondary | ICD-10-CM | POA: Diagnosis not present

## 2021-10-23 DIAGNOSIS — Z510 Encounter for antineoplastic radiation therapy: Secondary | ICD-10-CM | POA: Diagnosis not present

## 2021-10-23 DIAGNOSIS — Z5111 Encounter for antineoplastic chemotherapy: Secondary | ICD-10-CM | POA: Diagnosis not present

## 2021-10-23 LAB — COMPREHENSIVE METABOLIC PANEL
ALT: 72 U/L — ABNORMAL HIGH (ref 0–44)
AST: 39 U/L (ref 15–41)
Albumin: 3.1 g/dL — ABNORMAL LOW (ref 3.5–5.0)
Alkaline Phosphatase: 81 U/L (ref 38–126)
Anion gap: 5 (ref 5–15)
BUN: 17 mg/dL (ref 8–23)
CO2: 24 mmol/L (ref 22–32)
Calcium: 7.9 mg/dL — ABNORMAL LOW (ref 8.9–10.3)
Chloride: 107 mmol/L (ref 98–111)
Creatinine, Ser: 1.16 mg/dL (ref 0.61–1.24)
GFR, Estimated: >60 mL/min (ref >60)
Glucose, Bld: 148 mg/dL — ABNORMAL HIGH (ref 70–99)
Potassium: 3.2 mmol/L — ABNORMAL LOW (ref 3.5–5.1)
Sodium: 136 mmol/L (ref 135–145)
Total Bilirubin: 0.4 mg/dL (ref 0.3–1.2)
Total Protein: 6.4 g/dL — ABNORMAL LOW (ref 6.5–8.1)

## 2021-10-23 LAB — RAD ONC ARIA SESSION SUMMARY
Course Elapsed Days: 19
Plan Fractions Treated to Date: 12
Plan Prescribed Dose Per Fraction: 2 Gy
Plan Total Fractions Prescribed: 35
Plan Total Prescribed Dose: 70 Gy
Reference Point Dosage Given to Date: 24 Gy
Reference Point Session Dosage Given: 2 Gy
Session Number: 12

## 2021-10-23 LAB — CBC WITH DIFFERENTIAL/PLATELET
Abs Immature Granulocytes: 0.04 10*3/uL (ref 0.00–0.07)
Basophils Absolute: 0 10*3/uL (ref 0.0–0.1)
Basophils Relative: 1 %
Eosinophils Absolute: 0.1 10*3/uL (ref 0.0–0.5)
Eosinophils Relative: 3 %
HCT: 32.6 % — ABNORMAL LOW (ref 39.0–52.0)
Hemoglobin: 10.8 g/dL — ABNORMAL LOW (ref 13.0–17.0)
Immature Granulocytes: 1 %
Lymphocytes Relative: 28 %
Lymphs Abs: 0.9 10*3/uL (ref 0.7–4.0)
MCH: 27.6 pg (ref 26.0–34.0)
MCHC: 33.1 g/dL (ref 30.0–36.0)
MCV: 83.2 fL (ref 80.0–100.0)
Monocytes Absolute: 0.5 10*3/uL (ref 0.1–1.0)
Monocytes Relative: 17 %
Neutro Abs: 1.6 10*3/uL — ABNORMAL LOW (ref 1.7–7.7)
Neutrophils Relative %: 50 %
Platelets: 206 10*3/uL (ref 150–400)
RBC: 3.92 MIL/uL — ABNORMAL LOW (ref 4.22–5.81)
RDW: 16.6 % — ABNORMAL HIGH (ref 11.5–15.5)
WBC: 3.2 10*3/uL — ABNORMAL LOW (ref 4.0–10.5)
nRBC: 0 % (ref 0.0–0.2)

## 2021-10-23 MED ORDER — FAMOTIDINE IN NACL 20-0.9 MG/50ML-% IV SOLN
20.0000 mg | Freq: Once | INTRAVENOUS | Status: AC
  Administered 2021-10-23: 20 mg via INTRAVENOUS
  Filled 2021-10-23: qty 50

## 2021-10-23 MED ORDER — DIPHENHYDRAMINE HCL 50 MG/ML IJ SOLN
50.0000 mg | Freq: Once | INTRAMUSCULAR | Status: AC
  Administered 2021-10-23: 50 mg via INTRAVENOUS
  Filled 2021-10-23: qty 1

## 2021-10-23 MED ORDER — POTASSIUM CHLORIDE CRYS ER 20 MEQ PO TBCR: 20.0000 meq | EXTENDED_RELEASE_TABLET | Freq: Every day | ORAL | 0 refills | Status: DC

## 2021-10-23 MED ORDER — PALONOSETRON HCL INJECTION 0.25 MG/5ML
0.2500 mg | Freq: Once | INTRAVENOUS | Status: AC
  Administered 2021-10-23: 0.25 mg via INTRAVENOUS
  Filled 2021-10-23: qty 5

## 2021-10-23 MED ORDER — HEPARIN SOD (PORK) LOCK FLUSH 100 UNIT/ML IV SOLN
500.0000 [IU] | Freq: Once | INTRAVENOUS | Status: AC | PRN
  Administered 2021-10-23: 500 [IU]
  Filled 2021-10-23: qty 5

## 2021-10-23 MED ORDER — SODIUM CHLORIDE 0.9 % IV SOLN
160.0000 mg | Freq: Once | INTRAVENOUS | Status: AC
  Administered 2021-10-23: 160 mg via INTRAVENOUS
  Filled 2021-10-23: qty 16

## 2021-10-23 MED ORDER — SODIUM CHLORIDE 0.9 % IV SOLN
10.0000 mg | Freq: Once | INTRAVENOUS | Status: AC
  Administered 2021-10-23: 10 mg via INTRAVENOUS
  Filled 2021-10-23: qty 1

## 2021-10-23 MED ORDER — SODIUM CHLORIDE 0.9 % IV SOLN
Freq: Once | INTRAVENOUS | Status: AC
  Filled 2021-10-23: qty 250

## 2021-10-23 MED ORDER — SODIUM CHLORIDE 0.9 % IV SOLN
45.0000 mg/m2 | Freq: Once | INTRAVENOUS | Status: AC
  Administered 2021-10-23: 90 mg via INTRAVENOUS
  Filled 2021-10-23: qty 15

## 2021-10-23 MED ORDER — POTASSIUM CHLORIDE IN NACL 20-0.9 MEQ/L-% IV SOLN
Freq: Once | INTRAVENOUS | Status: AC
  Filled 2021-10-23: qty 1000

## 2021-10-23 NOTE — Patient Instructions (Signed)
[facility]  Discharge Instructions: Thank you for choosing [facility] to provide your oncology and hematology care.  If you have a lab appointment with the Cancer Center, please go directly to the Cancer Center and check in at the registration area.  Wear comfortable clothing and clothing appropriate for easy access to any Portacath or PICC line.   We strive to give you quality time with your provider. You may need to reschedule your appointment if you arrive late (15 or more minutes).  Arriving late affects you and other patients whose appointments are after yours.  Also, if you miss three or more appointments without notifying the office, you may be dismissed from the clinic at the provider's discretion.      For prescription refill requests, have your pharmacy contact our office and allow 72 hours for refills to be completed.       To help prevent nausea and vomiting after your treatment, we encourage you to take your nausea medication as directed.  BELOW ARE SYMPTOMS THAT SHOULD BE REPORTED IMMEDIATELY: *FEVER GREATER THAN 100.4 F (38 C) OR HIGHER *CHILLS OR SWEATING *NAUSEA AND VOMITING THAT IS NOT CONTROLLED WITH YOUR NAUSEA MEDICATION *UNUSUAL SHORTNESS OF BREATH *UNUSUAL BRUISING OR BLEEDING *URINARY PROBLEMS (pain or burning when urinating, or frequent urination) *BOWEL PROBLEMS (unusual diarrhea, constipation, pain near the anus) TENDERNESS IN MOUTH AND THROAT WITH OR WITHOUT PRESENCE OF ULCERS (sore throat, sores in mouth, or a toothache) UNUSUAL RASH, SWELLING OR PAIN  UNUSUAL VAGINAL DISCHARGE OR ITCHING   Items with * indicate a potential emergency and should be followed up as soon as possible or go to the Emergency Department if any problems should occur.  Please show the CHEMOTHERAPY ALERT CARD or IMMUNOTHERAPY ALERT CARD at check-in to the Emergency Department and triage nurse.  Should you have questions after your  visit or need to cancel or reschedule your appointment, please contact [facility]  [phone] and follow the prompts.  Office hours are 8:00 a.m. to 4:30 p.m. Monday - Friday. Please note that voicemails left after 4:00 p.m. may not be returned until the following business day.  We are closed weekends and major holidays. You have access to a nurse at all times for urgent questions. Please call the main number to the clinic [phone] and follow the prompts.  For any non-urgent questions, you may also contact your provider using MyChart. We now offer e-Visits for anyone 18 and older to request care online for non-urgent symptoms. For details visit [email or web address].   Also download the MyChart app! Go to the app store, search "MyChart", open the app, select [facility], and log in with your MyChart username and password.  Masks are optional in the cancer centers. If you would like for your care team to wear a mask while they are taking care of you, please let them know. For doctor visits, patients may have with them one support person who is at least 77 years old. At this time, visitors are not allowed in the infusion area.   

## 2021-10-23 NOTE — Progress Notes (Signed)
Per Dr. Janese Banks, will give 20K of IV potassium today as well as treatment.

## 2021-10-24 ENCOUNTER — Other Ambulatory Visit: Payer: Self-pay

## 2021-10-24 ENCOUNTER — Ambulatory Visit
Admission: RE | Admit: 2021-10-24 | Discharge: 2021-10-24 | Disposition: A | Discharge status: Home / Self Care | Payer: Medicare Other | Source: Ambulatory Visit | Attending: Radiation Oncology | Admitting: Radiation Oncology

## 2021-10-24 DIAGNOSIS — Z5111 Encounter for antineoplastic chemotherapy: Secondary | ICD-10-CM | POA: Diagnosis not present

## 2021-10-24 DIAGNOSIS — Z510 Encounter for antineoplastic radiation therapy: Secondary | ICD-10-CM | POA: Diagnosis not present

## 2021-10-24 DIAGNOSIS — Z87891 Personal history of nicotine dependence: Secondary | ICD-10-CM | POA: Diagnosis not present

## 2021-10-24 DIAGNOSIS — D701 Agranulocytosis secondary to cancer chemotherapy: Secondary | ICD-10-CM | POA: Diagnosis not present

## 2021-10-24 DIAGNOSIS — C3411 Malignant neoplasm of upper lobe, right bronchus or lung: Secondary | ICD-10-CM | POA: Diagnosis not present

## 2021-10-24 DIAGNOSIS — R0789 Other chest pain: Secondary | ICD-10-CM | POA: Diagnosis not present

## 2021-10-24 LAB — RAD ONC ARIA SESSION SUMMARY
Course Elapsed Days: 20
Plan Fractions Treated to Date: 13
Plan Prescribed Dose Per Fraction: 2 Gy
Plan Total Fractions Prescribed: 35
Plan Total Prescribed Dose: 70 Gy
Reference Point Dosage Given to Date: 26 Gy
Reference Point Session Dosage Given: 2 Gy
Session Number: 13

## 2021-10-25 ENCOUNTER — Telehealth: Payer: Self-pay | Admitting: *Deleted

## 2021-10-25 ENCOUNTER — Other Ambulatory Visit: Payer: Self-pay

## 2021-10-25 ENCOUNTER — Ambulatory Visit
Admission: RE | Admit: 2021-10-25 | Discharge: 2021-10-25 | Disposition: A | Discharge status: Home / Self Care | Payer: Medicare Other | Source: Ambulatory Visit | Attending: Radiation Oncology | Admitting: Radiation Oncology

## 2021-10-25 ENCOUNTER — Inpatient Hospital Stay: Payer: Medicare Other | Admitting: Family Medicine

## 2021-10-25 DIAGNOSIS — Z87891 Personal history of nicotine dependence: Secondary | ICD-10-CM | POA: Diagnosis not present

## 2021-10-25 DIAGNOSIS — D6481 Anemia due to antineoplastic chemotherapy: Secondary | ICD-10-CM | POA: Diagnosis present

## 2021-10-25 DIAGNOSIS — Z510 Encounter for antineoplastic radiation therapy: Secondary | ICD-10-CM | POA: Diagnosis not present

## 2021-10-25 DIAGNOSIS — R0789 Other chest pain: Secondary | ICD-10-CM | POA: Diagnosis not present

## 2021-10-25 DIAGNOSIS — Z5111 Encounter for antineoplastic chemotherapy: Secondary | ICD-10-CM | POA: Diagnosis not present

## 2021-10-25 DIAGNOSIS — D701 Agranulocytosis secondary to cancer chemotherapy: Secondary | ICD-10-CM | POA: Diagnosis not present

## 2021-10-25 DIAGNOSIS — C3411 Malignant neoplasm of upper lobe, right bronchus or lung: Secondary | ICD-10-CM | POA: Diagnosis not present

## 2021-10-25 LAB — RAD ONC ARIA SESSION SUMMARY
Course Elapsed Days: 21
Plan Fractions Treated to Date: 14
Plan Prescribed Dose Per Fraction: 2 Gy
Plan Total Fractions Prescribed: 35
Plan Total Prescribed Dose: 70 Gy
Reference Point Dosage Given to Date: 28 Gy
Reference Point Session Dosage Given: 2 Gy
Session Number: 14

## 2021-10-25 NOTE — Progress Notes (Deleted)
      Established patient visit   Patient: Luke Rojas   DOB: August 08, 1945   76 y.o. Male  MRN: [id number] Visit Date: 10/25/2021  Today's healthcare provider: Myles Gip, DO   No chief complaint on file.  Subjective    HPI  Follow up Hospitalization  Patient was admitted to [facility] on 10/15/21 and discharged on 10/17/21. He was treated for diverticulitis. Treatment for this included ***. Telephone follow up was done on *** He reports {excellent/good/fair:19992} compliance with treatment. He reports this condition is {resolved/improved/worsened:23923}.  ----------------------------------------------------------------------------------------- -   Medications: Outpatient Medications Prior to Visit  Medication Sig   amLODipine (NORVASC) 10 MG tablet TAKE 1 TABLET BY MOUTH ONCE A DAY   aspirin EC 81 MG tablet Take 1 tablet (81 mg total) by mouth daily. Swallow whole.   atorvastatin (LIPITOR) 80 MG tablet TAKE 1 TABLET BY MOUTH EVERY NIGHT AT BEDTIME   clopidogrel (PLAVIX) 75 MG tablet Take 75 mg by mouth daily.   co-enzyme Q-10 30 MG capsule Take 30 mg by mouth daily.    diclofenac sodium (VOLTAREN) 1 % GEL Apply 2 g topically 4 (four) times daily.   docusate sodium (COLACE) 100 MG capsule Take 1 capsule (100 mg total) by mouth 2 (two) times daily.   Krill Oil 1000 MG CAPS Take 1,000 mg by mouth daily.    lidocaine-prilocaine (EMLA) cream Apply to affected area once   metFORMIN (GLUCOPHAGE-XR) 750 MG 24 hr tablet TAKE 1 TABLET BY MOUTH ONCE A DAY WITH BREAKFAST   metoprolol tartrate (LOPRESSOR) 25 MG tablet Take 1 tablet (25 mg total) by mouth 2 (two) times daily.   MULTIPLE VITAMIN PO Take 1 tablet by mouth daily.   OneTouch Delica Lancets 96V MISC TEST FASTING GLUCOSE LEVEL EACH MORNING BEFORE BREAKFAST   oxyCODONE-acetaminophen (PERCOCET/ROXICET) 5-325 MG tablet Take 1 tablet by mouth every 4 (four) hours as needed for moderate pain. (Patient not taking: Reported on  10/18/2021)   pantoprazole (PROTONIX) 40 MG tablet Take 40 mg by mouth daily.   polyethylene glycol (MIRALAX / GLYCOLAX) 17 g packet Take 17 g by mouth daily.   potassium chloride SA (KLOR-CON M) 20 MEQ tablet Take 1 tablet (20 mEq total) by mouth daily.   ramipril (ALTACE) 10 MG capsule Take 1 capsule (10 mg total) by mouth daily.   tamsulosin (FLOMAX) 0.4 MG CAPS capsule Take 0.4 mg by mouth daily.   No facility-administered medications prior to visit.    Review of Systems  {Labs  Heme  Chem  Endocrine  Serology  Results Review (optional):23779}   Objective    There were no vitals taken for this visit. {Show previous vital signs (optional):23777}  Physical Exam  ***  No results found for any visits on 10/25/21.  Assessment & Plan     ***  No follow-ups on file.      {provider attestation***:1}   Myles Gip, DO  [facility] [phone] (phone) [phone] (fax)  [place]

## 2021-10-25 NOTE — Telephone Encounter (Signed)
   Telephone encounter was:  Unsuccessful.  10/25/2021 Name: Luke Rojas MRN: [id number] DOB: [birth year]  Unsuccessful outbound call made today to assist with:  Food Insecurity  Outreach Attempt:  2nd Attempt  A HIPAA compliant voice message was left requesting a return call.  Instructed patient to call back at [phone]. [place] [phone] [address] , [place] [address] Email : [email or web address] [email or web address]

## 2021-10-26 ENCOUNTER — Encounter: Payer: Self-pay | Admitting: Oncology

## 2021-10-26 ENCOUNTER — Other Ambulatory Visit: Payer: Self-pay

## 2021-10-26 ENCOUNTER — Encounter: Payer: Self-pay | Admitting: Family Medicine

## 2021-10-26 ENCOUNTER — Ambulatory Visit
Admission: RE | Admit: 2021-10-26 | Discharge: 2021-10-26 | Disposition: A | Discharge status: Home / Self Care | Payer: Medicare Other | Source: Ambulatory Visit | Attending: Radiation Oncology | Admitting: Radiation Oncology

## 2021-10-26 ENCOUNTER — Ambulatory Visit (INDEPENDENT_AMBULATORY_CARE_PROVIDER_SITE_OTHER): Payer: Medicare Other | Admitting: Family Medicine

## 2021-10-26 ENCOUNTER — Telehealth: Payer: Self-pay | Admitting: *Deleted

## 2021-10-26 VITALS — BP 97/61 | HR 87 | Temp 97.5°F | Resp 16 | Wt 170.2 lb

## 2021-10-26 DIAGNOSIS — I152 Hypertension secondary to endocrine disorders: Secondary | ICD-10-CM | POA: Diagnosis not present

## 2021-10-26 DIAGNOSIS — K5792 Diverticulitis of intestine, part unspecified, without perforation or abscess without bleeding: Secondary | ICD-10-CM | POA: Diagnosis not present

## 2021-10-26 DIAGNOSIS — C3411 Malignant neoplasm of upper lobe, right bronchus or lung: Secondary | ICD-10-CM | POA: Diagnosis not present

## 2021-10-26 DIAGNOSIS — E1169 Type 2 diabetes mellitus with other specified complication: Secondary | ICD-10-CM | POA: Diagnosis not present

## 2021-10-26 DIAGNOSIS — E785 Hyperlipidemia, unspecified: Secondary | ICD-10-CM

## 2021-10-26 DIAGNOSIS — Z87891 Personal history of nicotine dependence: Secondary | ICD-10-CM | POA: Diagnosis not present

## 2021-10-26 DIAGNOSIS — E1159 Type 2 diabetes mellitus with other circulatory complications: Secondary | ICD-10-CM

## 2021-10-26 DIAGNOSIS — I10 Essential (primary) hypertension: Secondary | ICD-10-CM

## 2021-10-26 DIAGNOSIS — T451X5A Adverse effect of antineoplastic and immunosuppressive drugs, initial encounter: Secondary | ICD-10-CM | POA: Diagnosis not present

## 2021-10-26 DIAGNOSIS — Z5111 Encounter for antineoplastic chemotherapy: Secondary | ICD-10-CM | POA: Diagnosis not present

## 2021-10-26 DIAGNOSIS — Z510 Encounter for antineoplastic radiation therapy: Secondary | ICD-10-CM | POA: Diagnosis not present

## 2021-10-26 DIAGNOSIS — D701 Agranulocytosis secondary to cancer chemotherapy: Secondary | ICD-10-CM | POA: Diagnosis not present

## 2021-10-26 DIAGNOSIS — D6481 Anemia due to antineoplastic chemotherapy: Secondary | ICD-10-CM | POA: Diagnosis not present

## 2021-10-26 DIAGNOSIS — R0789 Other chest pain: Secondary | ICD-10-CM | POA: Diagnosis not present

## 2021-10-26 DIAGNOSIS — I2510 Atherosclerotic heart disease of native coronary artery without angina pectoris: Secondary | ICD-10-CM | POA: Diagnosis not present

## 2021-10-26 LAB — RAD ONC ARIA SESSION SUMMARY
Course Elapsed Days: 22
Plan Fractions Treated to Date: 15
Plan Prescribed Dose Per Fraction: 2 Gy
Plan Total Fractions Prescribed: 35
Plan Total Prescribed Dose: 70 Gy
Reference Point Dosage Given to Date: 30 Gy
Reference Point Session Dosage Given: 2 Gy
Session Number: 15

## 2021-10-26 MED ORDER — AMLODIPINE BESYLATE 5 MG PO TABS: 5.0000 mg | ORAL_TABLET | Freq: Every day | ORAL | 3 refills | Status: DC

## 2021-10-26 NOTE — Progress Notes (Signed)
Met with patient briefly during infusion. Pt states that he is doing well but continues to struggle with appetite and finding foods that he isn't supposed to eat due to diverticulitis. Pt states has an appt coming up with nutritionist to further discuss. Encouraged pt to continue with his boost and ensure drinks to help supplement. Nothing further needed at this time. Will follow up with patient next week. Instructed pt to call with any questions or needs prior to his appt next week. Pt verbalized understanding.

## 2021-10-26 NOTE — Telephone Encounter (Signed)
   Telephone encounter was:  Unsuccessful.  10/26/2021 Name: Luke Rojas MRN: [id number] DOB: Apr 07, 1945  Unsuccessful outbound call made today to assist with:  Food Insecurity  Outreach Attempt:  3rd Attempt.  Referral closed unable to contact patient. Went ahead and emailed [employer] at [facility] for previous surgery benefits called 3x no response but was requested in referral so they will outreach patient  A HIPAA compliant voice message was left requesting a return call.  Instructed patient to call back at [phone].  [place] [phone] [address] , [place] [address] Email : [email or web address] [email or web address]

## 2021-10-26 NOTE — Progress Notes (Signed)
I,Sulibeya S Dimas,acting as a scribe for Wilhemena Durie, MD.,have documented all relevant documentation on the behalf of Wilhemena Durie, MD,as directed by  Wilhemena Durie, MD while in the presence of Wilhemena Durie, MD.   Established patient visit   Patient: Luke Rojas   DOB: 12/28/1945   76 y.o. Male  MRN: [id number] Visit Date: 10/26/2021  Today's healthcare provider: Wilhemena Durie, MD   Chief Complaint  Patient presents with   Hospitalization Follow-up   Subjective    HPI  Patient was undergoing work-up for lung cancer and has been recently treated and discharged for acute diverticulitis. He is feeling better.  He has with his wife with him today.  He feels good since discharge.  His only complaint is 1 of lower extremity edema that is mild.  This of breath or chest pain  Follow up Hospitalization  Patient was admitted to [facility] on 10/15/2021 and discharged on 10/25/2021. He was treated for; Acute diverticulitis. Treatment for this included; see notes in chart. Telephone follow up was done on 10/25/2021 He reports excellent compliance with treatment. He reports this condition is improved.  ----------------------------------------------------------------------------------------- -   Medications: Outpatient Medications Prior to Visit  Medication Sig   amLODipine (NORVASC) 10 MG tablet TAKE 1 TABLET BY MOUTH ONCE A DAY   aspirin EC 81 MG tablet Take 1 tablet (81 mg total) by mouth daily. Swallow whole.   atorvastatin (LIPITOR) 80 MG tablet TAKE 1 TABLET BY MOUTH EVERY NIGHT AT BEDTIME   clopidogrel (PLAVIX) 75 MG tablet Take 75 mg by mouth daily.   co-enzyme Q-10 30 MG capsule Take 30 mg by mouth daily.    diclofenac sodium (VOLTAREN) 1 % GEL Apply 2 g topically 4 (four) times daily.   docusate sodium (COLACE) 100 MG capsule Take 1 capsule (100 mg total) by mouth 2 (two) times daily.   Krill Oil 1000 MG CAPS Take 1,000 mg by mouth daily.     lidocaine-prilocaine (EMLA) cream Apply to affected area once   metFORMIN (GLUCOPHAGE-XR) 750 MG 24 hr tablet TAKE 1 TABLET BY MOUTH ONCE A DAY WITH BREAKFAST   metoprolol tartrate (LOPRESSOR) 25 MG tablet Take 1 tablet (25 mg total) by mouth 2 (two) times daily.   MULTIPLE VITAMIN PO Take 1 tablet by mouth daily.   OneTouch Delica Lancets 76H MISC TEST FASTING GLUCOSE LEVEL EACH MORNING BEFORE BREAKFAST   oxyCODONE-acetaminophen (PERCOCET/ROXICET) 5-325 MG tablet Take 1 tablet by mouth every 4 (four) hours as needed for moderate pain.   pantoprazole (PROTONIX) 40 MG tablet Take 40 mg by mouth daily.   polyethylene glycol (MIRALAX / GLYCOLAX) 17 g packet Take 17 g by mouth daily.   potassium chloride SA (KLOR-CON M) 20 MEQ tablet Take 1 tablet (20 mEq total) by mouth daily.   ramipril (ALTACE) 10 MG capsule Take 1 capsule (10 mg total) by mouth daily.   tamsulosin (FLOMAX) 0.4 MG CAPS capsule Take 0.4 mg by mouth daily.   No facility-administered medications prior to visit.    Review of Systems  Constitutional:  Negative for appetite change, chills and fever.  Respiratory:  Negative for chest tightness, shortness of breath and wheezing.   Cardiovascular:  Negative for chest pain and palpitations.  Gastrointestinal:  Positive for diarrhea. Negative for abdominal pain, blood in stool, nausea and vomiting.    Last CBC Lab Results  Component Value Date   WBC 3.2 (L) 10/23/2021   HGB 10.8 (L)  10/23/2021   HCT 32.6 (L) 10/23/2021   MCV 83.2 10/23/2021   MCH 27.6 10/23/2021   RDW 16.6 (H) 10/23/2021   PLT 206 81/82/9937   Last metabolic panel Lab Results  Component Value Date   GLUCOSE 148 (H) 10/23/2021   NA 136 10/23/2021   K 3.2 (L) 10/23/2021   CL 107 10/23/2021   CO2 24 10/23/2021   BUN 17 10/23/2021   CREATININE 1.16 10/23/2021   GFRNONAA >60 10/23/2021   CALCIUM 7.9 (L) 10/23/2021   PROT 6.4 (L) 10/23/2021   ALBUMIN 3.1 (L) 10/23/2021   LABGLOB 2.6 04/04/2021   AGRATIO  1.5 04/04/2021   BILITOT 0.4 10/23/2021   ALKPHOS 81 10/23/2021   AST 39 10/23/2021   ALT 72 (H) 10/23/2021   ANIONGAP 5 10/23/2021   Last lipids Lab Results  Component Value Date   CHOL 122 04/04/2021   HDL 30 (L) 04/04/2021   LDLCALC 63 04/04/2021   TRIG 170 (H) 04/04/2021   CHOLHDL 4.1 04/04/2021   Last hemoglobin A1c Lab Results  Component Value Date   HGBA1C 5.6 07/17/2021   Last thyroid functions Lab Results  Component Value Date   TSH 2.930 12/04/2019       Objective    BP 97/61 (BP Location: Right Arm, Patient Position: Sitting, Cuff Size: Normal)   Pulse 87   Temp (!) 97.5 F (36.4 C) (Oral)   Resp 16   Wt 170 lb 3.2 oz (77.2 kg)   SpO2 98%   BMI 28.32 kg/m  BP Readings from Last 3 Encounters:  10/26/21 97/61  10/23/21 (!) 106/57  10/18/21 114/71   Wt Readings from Last 3 Encounters:  10/26/21 170 lb 3.2 oz (77.2 kg)  10/23/21 167 lb (75.8 kg)  10/18/21 167 lb (75.8 kg)      Physical Exam Vitals reviewed.  Constitutional:      General: He is not in acute distress.    Appearance: He is well-developed.  HENT:     Head: Normocephalic and atraumatic.     Right Ear: Hearing normal.     Left Ear: Hearing normal.     Nose: Nose normal.  Eyes:     General: Lids are normal. No scleral icterus.       Right eye: No discharge.        Left eye: No discharge.     Conjunctiva/sclera: Conjunctivae normal.  Cardiovascular:     Rate and Rhythm: Normal rate and regular rhythm.     Heart sounds: Normal heart sounds.  Pulmonary:     Effort: Pulmonary effort is normal. No respiratory distress.  Musculoskeletal:     Comments: Trace lower extremity edema bilaterally.  Skin:    Findings: No lesion or rash.  Neurological:     General: No focal deficit present.     Mental Status: He is alert and oriented to person, place, and time.  Psychiatric:        Mood and Affect: Mood normal.        Speech: Speech normal.        Behavior: Behavior normal.         Thought Content: Thought content normal.        Judgment: Judgment normal.       Results for orders placed or performed in visit on 10/26/21  Rad Onc [facility] Session Summary  Result Value Ref Range   Course ID [email or web address]    Course Intent Curative    Course Start Date 09/26/2021  3:05 PM    Session Number 15    Course First Treatment Date 10/04/2021 11:08 AM    Course Last Treatment Date 10/26/2021 11:13 AM    Course Elapsed Days 22    Reference Point ID Lung_R DP    Reference Point Dosage Given to Date 86.57846962 Gy   Reference Point Session Dosage Given 1.99999999 Gy   Plan ID [id number]    Plan Fractions Treated to Date 15    Plan Total Fractions Prescribed 35    Plan Prescribed Dose Per Fraction 2 Gy   Plan Total Prescribed Dose 70.000000 Gy   Plan Primary Reference Point Lung_R DP     Assessment & Plan     1. Essential hypertension Due to lower extremity edema we will cut amlodipine dose in half.  I think it is a side effect and not any heart failure  - amLODipine (NORVASC) 5 MG tablet; Take 1 tablet (5 mg total) by mouth daily.  Dispense: 90 tablet; Refill: 3  2. Acute diverticulitis Treated and resolved  3. Coronary artery disease involving native coronary artery of native heart without angina pectoris Risk factors treated  4. Hypertension associated with diabetes ([place]) Systolic less than 952 so we will do fine cutting back on amlodipine dose from 10-  5. Malignant neoplasm of upper lobe of right lung (HCC) Undergoing therapy  6. Hyperlipidemia associated with type 2 diabetes mellitus ([place])   7. Anemia associated with chemotherapy    No follow-ups on file.      I, Wilhemena Durie, MD, have reviewed all documentation for this visit. The documentation on 10/29/21 for the exam, diagnosis, procedures, and orders are all accurate and complete.    Markise Haymer Cranford Mon, MD  [facility] [phone] (phone) [phone] (fax)  [place]

## 2021-10-27 ENCOUNTER — Telehealth: Payer: Self-pay | Admitting: *Deleted

## 2021-10-27 ENCOUNTER — Other Ambulatory Visit: Payer: Self-pay

## 2021-10-27 ENCOUNTER — Ambulatory Visit
Admission: RE | Admit: 2021-10-27 | Discharge: 2021-10-27 | Disposition: A | Discharge status: Home / Self Care | Payer: Medicare Other | Source: Ambulatory Visit | Attending: Radiation Oncology | Admitting: Radiation Oncology

## 2021-10-27 DIAGNOSIS — R0789 Other chest pain: Secondary | ICD-10-CM | POA: Diagnosis not present

## 2021-10-27 DIAGNOSIS — D701 Agranulocytosis secondary to cancer chemotherapy: Secondary | ICD-10-CM | POA: Diagnosis not present

## 2021-10-27 DIAGNOSIS — Z87891 Personal history of nicotine dependence: Secondary | ICD-10-CM | POA: Diagnosis not present

## 2021-10-27 DIAGNOSIS — C3411 Malignant neoplasm of upper lobe, right bronchus or lung: Secondary | ICD-10-CM | POA: Diagnosis not present

## 2021-10-27 DIAGNOSIS — Z5111 Encounter for antineoplastic chemotherapy: Secondary | ICD-10-CM | POA: Diagnosis not present

## 2021-10-27 DIAGNOSIS — Z510 Encounter for antineoplastic radiation therapy: Secondary | ICD-10-CM | POA: Diagnosis not present

## 2021-10-27 LAB — RAD ONC ARIA SESSION SUMMARY
Course Elapsed Days: 23
Plan Fractions Treated to Date: 16
Plan Prescribed Dose Per Fraction: 2 Gy
Plan Total Fractions Prescribed: 35
Plan Total Prescribed Dose: 70 Gy
Reference Point Dosage Given to Date: 32 Gy
Reference Point Session Dosage Given: 2 Gy
Session Number: 16

## 2021-10-27 MED FILL — Dexamethasone Sodium Phosphate Inj 100 MG/10ML: INTRAMUSCULAR | Qty: 1 | Status: AC

## 2021-10-27 NOTE — Telephone Encounter (Signed)
   Telephone encounter was:  Unsuccessful.  10/27/2021 Name: DMARCO BALDUS MRN: [id number] DOB: [birth year]  Unsuccessful outbound call made today to assist with:  Food Insecurity  Outreach Attempt:  3rd Attempt.  Referral closed unable to contact patient. [employer] rep reached out via email as she has been unable to reach patient to activate [employer] , I left another message for the patient . A HIPAA compliant voice message was left requesting a return call.  Instructed patient to call back at [phone]. [place] [phone] [address] , [place] [address] Email : Ashby [email or web address]

## 2021-10-30 ENCOUNTER — Telehealth: Payer: Self-pay | Admitting: Oncology

## 2021-10-30 ENCOUNTER — Encounter: Payer: Self-pay | Admitting: Emergency Medicine

## 2021-10-30 ENCOUNTER — Other Ambulatory Visit: Payer: Self-pay

## 2021-10-30 ENCOUNTER — Emergency Department
Admission: EM | Admit: 2021-10-30 | Discharge: 2021-10-30 | Disposition: A | Discharge status: Home / Self Care | Payer: Medicare Other | Attending: Emergency Medicine | Admitting: Emergency Medicine

## 2021-10-30 ENCOUNTER — Encounter: Payer: Self-pay | Admitting: Oncology

## 2021-10-30 ENCOUNTER — Inpatient Hospital Stay: Payer: Medicare Other

## 2021-10-30 ENCOUNTER — Ambulatory Visit
Admission: RE | Admit: 2021-10-30 | Discharge: 2021-10-30 | Disposition: A | Discharge status: Home / Self Care | Payer: Medicare Other | Source: Ambulatory Visit | Attending: Radiation Oncology | Admitting: Radiation Oncology

## 2021-10-30 ENCOUNTER — Inpatient Hospital Stay (HOSPITAL_BASED_OUTPATIENT_CLINIC_OR_DEPARTMENT_OTHER): Payer: Medicare Other | Admitting: Oncology

## 2021-10-30 ENCOUNTER — Emergency Department: Payer: Medicare Other

## 2021-10-30 ENCOUNTER — Other Ambulatory Visit: Payer: Self-pay | Admitting: *Deleted

## 2021-10-30 VITALS — BP 85/58 | HR 89 | Temp 97.4°F | Resp 16 | Wt 164.0 lb

## 2021-10-30 DIAGNOSIS — R0789 Other chest pain: Secondary | ICD-10-CM | POA: Diagnosis not present

## 2021-10-30 DIAGNOSIS — Z5111 Encounter for antineoplastic chemotherapy: Secondary | ICD-10-CM | POA: Diagnosis not present

## 2021-10-30 DIAGNOSIS — C3411 Malignant neoplasm of upper lobe, right bronchus or lung: Secondary | ICD-10-CM | POA: Diagnosis not present

## 2021-10-30 DIAGNOSIS — I2510 Atherosclerotic heart disease of native coronary artery without angina pectoris: Secondary | ICD-10-CM | POA: Diagnosis not present

## 2021-10-30 DIAGNOSIS — E119 Type 2 diabetes mellitus without complications: Secondary | ICD-10-CM | POA: Insufficient documentation

## 2021-10-30 DIAGNOSIS — C3490 Malignant neoplasm of unspecified part of unspecified bronchus or lung: Secondary | ICD-10-CM | POA: Diagnosis not present

## 2021-10-30 DIAGNOSIS — J449 Chronic obstructive pulmonary disease, unspecified: Secondary | ICD-10-CM | POA: Diagnosis not present

## 2021-10-30 DIAGNOSIS — R079 Chest pain, unspecified: Secondary | ICD-10-CM | POA: Diagnosis not present

## 2021-10-30 DIAGNOSIS — Z510 Encounter for antineoplastic radiation therapy: Secondary | ICD-10-CM | POA: Diagnosis not present

## 2021-10-30 DIAGNOSIS — D701 Agranulocytosis secondary to cancer chemotherapy: Secondary | ICD-10-CM | POA: Diagnosis not present

## 2021-10-30 DIAGNOSIS — Z87891 Personal history of nicotine dependence: Secondary | ICD-10-CM | POA: Diagnosis not present

## 2021-10-30 DIAGNOSIS — K219 Gastro-esophageal reflux disease without esophagitis: Secondary | ICD-10-CM

## 2021-10-30 LAB — TROPONIN I (HIGH SENSITIVITY)
Troponin I (High Sensitivity): 4 ng/L (ref <18)
Troponin I (High Sensitivity): 4 ng/L (ref <18)

## 2021-10-30 LAB — CBC
HCT: 31 % — ABNORMAL LOW (ref 39.0–52.0)
Hemoglobin: 10.2 g/dL — ABNORMAL LOW (ref 13.0–17.0)
MCH: 27.3 pg (ref 26.0–34.0)
MCHC: 32.9 g/dL (ref 30.0–36.0)
MCV: 82.9 fL (ref 80.0–100.0)
Platelets: 195 10*3/uL (ref 150–400)
RBC: 3.74 MIL/uL — ABNORMAL LOW (ref 4.22–5.81)
RDW: 16.1 % — ABNORMAL HIGH (ref 11.5–15.5)
WBC: 3.1 10*3/uL — ABNORMAL LOW (ref 4.0–10.5)
nRBC: 0 % (ref 0.0–0.2)

## 2021-10-30 LAB — COMPREHENSIVE METABOLIC PANEL
ALT: 38 U/L (ref 0–44)
AST: 33 U/L (ref 15–41)
Albumin: 3.6 g/dL (ref 3.5–5.0)
Alkaline Phosphatase: 80 U/L (ref 38–126)
Anion gap: 9 (ref 5–15)
BUN: 19 mg/dL (ref 8–23)
CO2: 22 mmol/L (ref 22–32)
Calcium: 8.7 mg/dL — ABNORMAL LOW (ref 8.9–10.3)
Chloride: 102 mmol/L (ref 98–111)
Creatinine, Ser: 1.04 mg/dL (ref 0.61–1.24)
GFR, Estimated: >60 mL/min (ref >60)
Glucose, Bld: 186 mg/dL — ABNORMAL HIGH (ref 70–99)
Potassium: 4.2 mmol/L (ref 3.5–5.1)
Sodium: 133 mmol/L — ABNORMAL LOW (ref 135–145)
Total Bilirubin: 1 mg/dL (ref 0.3–1.2)
Total Protein: 6.8 g/dL (ref 6.5–8.1)

## 2021-10-30 LAB — CBC WITH DIFFERENTIAL/PLATELET
Abs Immature Granulocytes: 0.02 10*3/uL (ref 0.00–0.07)
Basophils Absolute: 0 10*3/uL (ref 0.0–0.1)
Basophils Relative: 1 %
Eosinophils Absolute: 0.1 10*3/uL (ref 0.0–0.5)
Eosinophils Relative: 3 %
HCT: 33.1 % — ABNORMAL LOW (ref 39.0–52.0)
Hemoglobin: 11 g/dL — ABNORMAL LOW (ref 13.0–17.0)
Immature Granulocytes: 1 %
Lymphocytes Relative: 25 %
Lymphs Abs: 0.6 10*3/uL — ABNORMAL LOW (ref 0.7–4.0)
MCH: 27.2 pg (ref 26.0–34.0)
MCHC: 33.2 g/dL (ref 30.0–36.0)
MCV: 81.7 fL (ref 80.0–100.0)
Monocytes Absolute: 0.2 10*3/uL (ref 0.1–1.0)
Monocytes Relative: 7 %
Neutro Abs: 1.5 10*3/uL — ABNORMAL LOW (ref 1.7–7.7)
Neutrophils Relative %: 63 %
Platelets: 211 10*3/uL (ref 150–400)
RBC: 4.05 MIL/uL — ABNORMAL LOW (ref 4.22–5.81)
RDW: 16.3 % — ABNORMAL HIGH (ref 11.5–15.5)
WBC: 2.4 10*3/uL — ABNORMAL LOW (ref 4.0–10.5)
nRBC: 0 % (ref 0.0–0.2)

## 2021-10-30 LAB — RAD ONC ARIA SESSION SUMMARY
Course Elapsed Days: 26
Plan Fractions Treated to Date: 17
Plan Prescribed Dose Per Fraction: 2 Gy
Plan Total Fractions Prescribed: 35
Plan Total Prescribed Dose: 70 Gy
Reference Point Dosage Given to Date: 34 Gy
Reference Point Session Dosage Given: 2 Gy
Session Number: 17

## 2021-10-30 LAB — BASIC METABOLIC PANEL
Anion gap: 10 (ref 5–15)
BUN: 19 mg/dL (ref 8–23)
CO2: 20 mmol/L — ABNORMAL LOW (ref 22–32)
Calcium: 8.9 mg/dL (ref 8.9–10.3)
Chloride: 106 mmol/L (ref 98–111)
Creatinine, Ser: 1.2 mg/dL (ref 0.61–1.24)
GFR, Estimated: >60 mL/min (ref >60)
Glucose, Bld: 103 mg/dL — ABNORMAL HIGH (ref 70–99)
Potassium: 4.8 mmol/L (ref 3.5–5.1)
Sodium: 136 mmol/L (ref 135–145)

## 2021-10-30 MED ORDER — SODIUM CHLORIDE 0.9 % IV SOLN
Freq: Once | INTRAVENOUS | Status: AC
  Filled 2021-10-30: qty 250

## 2021-10-30 MED ORDER — MORPHINE SULFATE (PF) 2 MG/ML IV SOLN: 4.0000 mg | Freq: Once | INTRAVENOUS | Status: DC

## 2021-10-30 MED ORDER — SUCRALFATE 1 G PO TABS: 1.0000 g | ORAL_TABLET | Freq: Four times a day (QID) | ORAL | 0 refills | Status: DC | PRN

## 2021-10-30 MED ORDER — HEPARIN SOD (PORK) LOCK FLUSH 100 UNIT/ML IV SOLN
INTRAVENOUS | Status: AC
  Administered 2021-10-30: 500 [IU] via INTRAVENOUS
  Filled 2021-10-30: qty 5

## 2021-10-30 MED ORDER — MORPHINE SULFATE (PF) 2 MG/ML IV SOLN
4.0000 mg | Freq: Once | INTRAVENOUS | Status: AC
  Administered 2021-10-30: 4 mg via INTRAVENOUS
  Filled 2021-10-30: qty 2

## 2021-10-30 MED ORDER — PANTOPRAZOLE SODIUM 20 MG PO TBEC: 20.0000 mg | DELAYED_RELEASE_TABLET | Freq: Every day | ORAL | 2 refills | Status: DC

## 2021-10-30 MED ORDER — OXYCODONE HCL 5 MG PO TABS: 5.0000 mg | ORAL_TABLET | Freq: Four times a day (QID) | ORAL | 0 refills | Status: DC | PRN

## 2021-10-30 NOTE — Progress Notes (Signed)
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  He quit smoking about 30 years ago but still chews tobacco.  He presented to the ER with symptoms of neck pain which led to an MRI cervical spine as well as CT angio chest.  MRI cervical spine showed degenerative changes along with moderate spinal stenosis and mass effect between C3-C4 and C4-C5.  Foraminal stenosis at C3-C4 C5-C6-C7 nerve levels.  Nonspecific 10 mm T3 vertebral body lesion indeterminate for bone metastases.  CT angio chest showed a 4.5 x 4.9 x 3.7 cm right upper lobe peripheral mass with probable pleural involvement and potential early invasion into right middle lobe.  Borderline enlarged right hilar lymph nodes measuring up to 1.2 cm.   PET CT scan showed hypermetabolic 4.8 cm right upper lobe mass consistent with primary bronchogenic carcinoma and mild asymmetric FDG activity in the right suprahilar region suspicious for right hilar lymph node metastases.  Patient had a CT-guided right upper lobe lung biopsy which was consistent with squamous cell carcinoma.    Interval history-patient has been having on and off chest pain for the last few days.  He currently complains of chest pain in our clinic.  Pain is  midsternal and does not radiate to his left arm.  He has a prior cardiac history requiring heart stents a few years ago.Recently hospitalized for an episode of acute diverticulitis  ECOG PS- 1 Pain scale- 5  Opioid associated constipation- no  Review of systems- Review of Systems  Constitutional:  Positive for malaise/fatigue. Negative for chills, fever and weight loss.  HENT:  Negative for congestion, ear discharge and nosebleeds.   Eyes:  Negative for blurred vision.  Respiratory:  Negative for cough, hemoptysis, sputum production, shortness of breath and wheezing.   Cardiovascular:  Positive for chest pain. Negative for palpitations, orthopnea and claudication.  Gastrointestinal:  Negative for abdominal pain, blood in stool, constipation, diarrhea, heartburn, melena, nausea and vomiting.  Genitourinary:  Negative for dysuria, flank pain, frequency, hematuria and urgency.  Musculoskeletal:  Negative for back pain, joint pain and myalgias.  Skin:  Negative for rash.  Neurological:  Negative for dizziness, tingling, focal weakness, seizures, weakness and headaches.  Endo/Heme/Allergies:  Does not bruise/bleed easily.  Psychiatric/Behavioral:  Negative for depression and suicidal ideas. The patient does not have insomnia.       Allergies  Allergen Reactions   Codeine Nausea Only, Nausea And Vomiting and Other (See Comments)    Other reaction(s): Vomiting     Past Medical History:  Diagnosis Date   Arthritis    OSTEOARTHRITIS   Chronic kidney disease    kidney stones   COPD (chronic obstructive pulmonary disease) (HCC)    Coronary artery disease    2 stents    Diabetes mellitus without complication (HCC)    GERD (gastroesophageal reflux disease)  Hypercholesteremia    Hypertension    Lung cancer ([place])    Myocardial infarction [facility]) 1610   Renal colic      Past Surgical History:  Procedure Laterality Date   CARDIAC CATHETERIZATION  2013   2 stents   CAROTID  PTA/STENT INTERVENTION Right 09/11/2021   Procedure: CAROTID PTA/STENT INTERVENTION;  Surgeon: Algernon Huxley, MD;  Location: [place] [facility];  Service: Cardiovascular;  Laterality: Right;   CATARACT EXTRACTION, BILATERAL     COLONOSCOPY  04/27/05   COLONOSCOPY WITH PROPOFOL N/A 06/08/2015   Procedure: COLONOSCOPY WITH PROPOFOL;  Surgeon: Robert Bellow, MD;  Location: [facility];  Service: Endoscopy;  Laterality: N/A;   CYSTOSCOPY WITH INSERTION OF UROLIFT N/A 02/11/2020   Procedure: CYSTOSCOPY WITH INSERTION OF UROLIFT;  Surgeon: Royston Cowper, MD;  Location: [facility];  Service: Urology;  Laterality: N/A;   CYSTOSCOPY WITH STENT PLACEMENT Right 09/16/2016   Procedure: CYSTOSCOPY WITH STENT PLACEMENT;  Surgeon: Royston Cowper, MD;  Location: [facility];  Service: Urology;  Laterality: Right;   EXTRACORPOREAL SHOCK WAVE LITHOTRIPSY Left 11/04/2014   has had 2 previous lithotripsies   EXTRACORPOREAL SHOCK WAVE LITHOTRIPSY Left 03/24/2015   Procedure: EXTRACORPOREAL SHOCK WAVE LITHOTRIPSY (ESWL);  Surgeon: Royston Cowper, MD;  Location: [facility];  Service: Urology;  Laterality: Left;   EXTRACORPOREAL SHOCK WAVE LITHOTRIPSY Right 10/04/2016   Procedure: EXTRACORPOREAL SHOCK WAVE LITHOTRIPSY (ESWL);  Surgeon: Royston Cowper, MD;  Location: [facility];  Service: Urology;  Laterality: Right;   LEFT HEART CATH AND CORONARY ANGIOGRAPHY N/A 12/05/2018   Procedure: LEFT HEART CATH AND CORONARY ANGIOGRAPHY with possible pci and stent;  Surgeon: Yolonda Kida, MD;  Location: [place] [facility];  Service: Cardiovascular;  Laterality: N/A;   PORTA CATH INSERTION Right 09/25/2021   Procedure: PORTA CATH INSERTION;  Surgeon: Algernon Huxley, MD;  Location: [place] [facility];  Service: Cardiovascular;  Laterality: Right;    Social History   Socioeconomic History   Marital status: Married    Spouse name: Not on file   Number of children: 2   Years of education: Not on file   Highest education  level: 8th grade  Occupational History   Occupation: retired  Tobacco Use   Smoking status: Former    Packs/day: 1.00    Years: 30.00    Total pack years: 30.00    Types: Cigarettes    Quit date: 03/05/1984    Years since quitting: 37.6   Smokeless tobacco: Current    Types: Chew   Tobacco comments:    Chews tobacco daily.  Vaping Use   Vaping Use: Never used  Substance and Sexual Activity   Alcohol use: No    Alcohol/week: 0.0 standard drinks of alcohol   Drug use: No   Sexual activity: Not Currently  Other Topics Concern   Not on file  Social History Narrative   Lives with Zigmund Daniel, wife, and son [place].   Social Determinants of Health   Financial Resource Strain: Low Risk  (04/07/2020)   Overall Financial Resource Strain (CARDIA)    Difficulty of Paying Living Expenses: Not hard at all  Food Insecurity: No Food Insecurity (04/07/2020)   Hunger Vital Sign    Worried About Running Out of Food in the Last Year: Never true    Ran Out of Food in the Last Year: Never true  Transportation Needs: No Transportation Needs (04/07/2020)   PRAPARE - Transportation    Lack of Transportation (  Medical): No    Lack of Transportation (Non-Medical): No  Physical Activity: Inactive (04/07/2020)   Exercise Vital Sign    Days of Exercise per Week: 0 days    Minutes of Exercise per Session: 0 min  Stress: No Stress Concern Present (04/07/2020)   [place]    Feeling of Stress : Not at all  Social Connections: Moderately Integrated (04/07/2020)   Social Connection and Isolation Panel [NHANES]    Frequency of Communication with Friends and Family: More than three times a week    Frequency of Social Gatherings with Friends and Family: More than three times a week    Attends Religious Services: More than 4 times per year    Active Member of [employer] or Organizations: No    Attends Archivist Meetings: Never    Marital Status: Married   Human resources officer Violence: Not At Risk (04/07/2020)   Humiliation, Afraid, Rape, and Kick questionnaire    Fear of Current or Ex-Partner: No    Emotionally Abused: No    Physically Abused: No    Sexually Abused: No    Family History  Problem Relation Age of Onset   Pulmonary embolism Mother    Transient ischemic attack Mother    Diabetes Mother    Pancreatic cancer Father    Hypertension Father    Diabetes Father    Cirrhosis Brother      Current Facility-Administered Medications:    morphine (PF) 2 MG/ML injection 4 mg, 4 mg, Intravenous, Once, Sindy Guadeloupe, MD  Current Outpatient Medications:    amLODipine (NORVASC) 5 MG tablet, Take 1 tablet (5 mg total) by mouth daily., Disp: 90 tablet, Rfl: 3   aspirin EC 81 MG tablet, Take 1 tablet (81 mg total) by mouth daily. Swallow whole., Disp: 30 tablet, Rfl: 12   atorvastatin (LIPITOR) 80 MG tablet, TAKE 1 TABLET BY MOUTH EVERY NIGHT AT BEDTIME, Disp: 90 tablet, Rfl: 3   clopidogrel (PLAVIX) 75 MG tablet, Take 75 mg by mouth daily., Disp: , Rfl:    co-enzyme Q-10 30 MG capsule, Take 30 mg by mouth daily. , Disp: , Rfl:    docusate sodium (COLACE) 100 MG capsule, Take 1 capsule (100 mg total) by mouth 2 (two) times daily., Disp: 60 capsule, Rfl: 0   Krill Oil 1000 MG CAPS, Take 1,000 mg by mouth daily. , Disp: , Rfl:    metFORMIN (GLUCOPHAGE-XR) 750 MG 24 hr tablet, TAKE 1 TABLET BY MOUTH ONCE A DAY WITH BREAKFAST, Disp: 90 tablet, Rfl: 1   metoprolol tartrate (LOPRESSOR) 25 MG tablet, Take 1 tablet (25 mg total) by mouth 2 (two) times daily., Disp: 60 tablet, Rfl: 2   MULTIPLE VITAMIN PO, Take 1 tablet by mouth daily., Disp: , Rfl:    OneTouch Delica Lancets 93Z MISC, TEST FASTING GLUCOSE LEVEL EACH MORNING BEFORE BREAKFAST, Disp: 100 each, Rfl: 4   pantoprazole (PROTONIX) 20 MG tablet, Take 1 tablet (20 mg total) by mouth daily., Disp: 30 tablet, Rfl: 2   potassium chloride SA (KLOR-CON M) 20 MEQ tablet, Take 1 tablet (20 mEq  total) by mouth daily., Disp: 7 tablet, Rfl: 0   ramipril (ALTACE) 10 MG capsule, Take 1 capsule (10 mg total) by mouth daily., Disp: 30 capsule, Rfl: 2   tamsulosin (FLOMAX) 0.4 MG CAPS capsule, Take 0.4 mg by mouth daily., Disp: , Rfl:    diclofenac sodium (VOLTAREN) 1 % GEL, Apply 2 g topically  4 (four) times daily., Disp: 100 g, Rfl: 3   oxyCODONE (OXY IR/ROXICODONE) 5 MG immediate release tablet, Take 1 tablet (5 mg total) by mouth every 6 (six) hours as needed for severe pain., Disp: 30 tablet, Rfl: 0   polyethylene glycol (MIRALAX / GLYCOLAX) 17 g packet, Take 17 g by mouth daily. (Patient not taking: Reported on 10/30/2021), Disp: 30 packet, Rfl: 0  Physical exam:  Vitals:   10/30/21 0939  BP: (!) 85/58  Pulse: 89  Resp: 16  Temp: (!) 97.4 F (36.3 C)  SpO2: 100%  Weight: 164 lb (74.4 kg)   Physical Exam Constitutional:      General: He is not in acute distress. Cardiovascular:     Rate and Rhythm: Normal rate and regular rhythm.     Heart sounds: Normal heart sounds.  Pulmonary:     Effort: Pulmonary effort is normal.     Breath sounds: Normal breath sounds.  Skin:    General: Skin is warm and dry.  Neurological:     Mental Status: He is alert and oriented to person, place, and time.         Latest Ref Rng & Units 10/30/2021   11:59 AM  CMP  Glucose 70 - 99 mg/dL 103   BUN 8 - 23 mg/dL 19   Creatinine 0.61 - 1.24 mg/dL 1.20   Sodium 135 - 145 mmol/L 136   Potassium 3.5 - 5.1 mmol/L 4.8   Chloride 98 - 111 mmol/L 106   CO2 22 - 32 mmol/L 20   Calcium 8.9 - 10.3 mg/dL 8.9       Latest Ref Rng & Units 10/30/2021   11:59 AM  CBC  WBC 4.0 - 10.5 K/uL 3.1   Hemoglobin 13.0 - 17.0 g/dL 10.2   Hematocrit 39.0 - 52.0 % 31.0   Platelets 150 - 400 K/uL 195     No images are attached to the encounter.  DG Chest 2 View  Result Date: 10/30/2021 CLINICAL DATA:  Chest pain on and off for several days EXAM: CHEST - 2 VIEW COMPARISON:  10/15/2021 FINDINGS: Unchanged  right upper lobe opacity, a mass by CT. Porta catheter on the right with tip at the upper cavoatrial junction. Mild interstitial coarsening. There is no edema, consolidation, effusion, or pneumothorax. Normal heart size and mediastinal contours. Coronary stenting. IMPRESSION: No active cardiopulmonary disease. Electronically Signed   By: Jorje Guild M.D.   On: 10/30/2021 12:11   VAS US CAROTID  Result Date: 10/25/2021 Carotid Arterial Duplex Study Patient Name:  Luke Rojas  Date of Exam:   10/18/2021 Medical Rec #: [id number]           Accession #:    [id number] Date of Birth: 1945/07/11          Patient Gender: M Patient Age:   88 years Exam [facility] Procedure:      VAS US CAROTID Referring Phys: Leotis Pain --------------------------------------------------------------------------------  Indications:  Carotid artery disease and left endarterectomy. Risk Factors: Hypertension, hyperlipidemia, Diabetes, past history of smoking,               coronary artery disease. Performing Technologist: Delorise Shiner RVT  Examination Guidelines: A complete evaluation includes B-mode imaging, spectral Doppler, color Doppler, and power Doppler as needed of all accessible portions of each vessel. Bilateral testing is considered an integral part of a complete examination. Limited examinations for reoccurring indications may be performed as noted.  Right  Carotid Findings: +----------+--------+--------+--------+------------------+--------+           PSV cm/sEDV cm/sStenosisPlaque DescriptionComments +----------+--------+--------+--------+------------------+--------+ CCA Prox  70      0                                          +----------+--------+--------+--------+------------------+--------+ CCA Mid   77      13                                         +----------+--------+--------+--------+------------------+--------+ CCA Distal79      15                                 stent    +----------+--------+--------+--------+------------------+--------+ ICA Prox  70      17                                stent    +----------+--------+--------+--------+------------------+--------+ ICA Mid   73      23                                         +----------+--------+--------+--------+------------------+--------+ ICA Distal96      26                                         +----------+--------+--------+--------+------------------+--------+ ECA       167     13                                         +----------+--------+--------+--------+------------------+--------+ +----------+--------+-------+----------------+-------------------+           PSV cm/sEDV cmsDescribe        Arm Pressure (mmHG) +----------+--------+-------+----------------+-------------------+ Subclavian112     0      Multiphasic, WNL                    +----------+--------+-------+----------------+-------------------+ +---------+--------+--+--------+-+---------+ VertebralPSV cm/s47EDV cm/s8Antegrade +---------+--------+--+--------+-+---------+  Right Stent(s): +----------------+--------+--------+--------+--------+--------+ Distal CCA - ICAPSV cm/sEDV cm/sStenosisWaveformComments +----------------+--------+--------+--------+--------+--------+ Prox to Stent   84      17                               +----------------+--------+--------+--------+--------+--------+ Proximal Stent  68      13                               +----------------+--------+--------+--------+--------+--------+ Mid Stent       62      10                               +----------------+--------+--------+--------+--------+--------+ Distal Stent    58      14                               +----------------+--------+--------+--------+--------+--------+  Distal to Stent 72      23                               +----------------+--------+--------+--------+--------+--------+   Left  Carotid Findings: +----------+--------+--------+--------+------------------+--------+           PSV cm/sEDV cm/sStenosisPlaque DescriptionComments +----------+--------+--------+--------+------------------+--------+ CCA Prox  103     16                                         +----------+--------+--------+--------+------------------+--------+ CCA Mid   104     16                                         +----------+--------+--------+--------+------------------+--------+ CCA Distal89      19                                         +----------+--------+--------+--------+------------------+--------+ ICA Prox  75      26      1-39%   smooth                     +----------+--------+--------+--------+------------------+--------+ ICA Mid   88      26                                         +----------+--------+--------+--------+------------------+--------+ ICA Distal75      25                                         +----------+--------+--------+--------+------------------+--------+ ECA       138     0                                          +----------+--------+--------+--------+------------------+--------+ +----------+--------+--------+----------------+-------------------+           PSV cm/sEDV cm/sDescribe        Arm Pressure (mmHG) +----------+--------+--------+----------------+-------------------+ Subclavian122     0       Multiphasic, WNL                    +----------+--------+--------+----------------+-------------------+ +---------+--------+--+--------+--+---------+ VertebralPSV cm/s72EDV cm/s18Antegrade +---------+--------+--+--------+--+---------+   Summary: Right Carotid: There is no evidence of stenosis in the right ICA. Patent ICA                stent. Left Carotid: Velocities in the left ICA are consistent with a 1-39% stenosis. Vertebrals:  Bilateral vertebral arteries demonstrate antegrade flow. Subclavians: Normal flow hemodynamics  were seen in bilateral subclavian              arteries. *See table(s) above for measurements and observations.  Electronically signed by Leotis Pain MD on 10/25/2021 at 8:16:58 AM.    Final    CT Angio Chest PE W and/or Wo Contrast  Result Date: 10/15/2021 CLINICAL DATA:  High probability pulmonary embolism, left flank pain EXAM:  CT ANGIOGRAPHY CHEST CT ABDOMEN AND PELVIS WITH CONTRAST TECHNIQUE: Multidetector CT imaging of the chest was performed using the standard protocol during bolus administration of intravenous contrast. Multiplanar CT image reconstructions and MIPs were obtained to evaluate the vascular anatomy. Multidetector CT imaging of the abdomen and pelvis was performed using the standard protocol during bolus administration of intravenous contrast. RADIATION DOSE REDUCTION: This exam was performed according to the departmental dose-optimization program which includes automated exposure control, adjustment of the mA and/or kV according to patient size and/or use of iterative reconstruction technique. CONTRAST:  148mL OMNIPAQUE IOHEXOL 350 MG/ML SOLN COMPARISON:  PET/CT 08/29/2021 and CT abdomen and pelvis 06/19/2021 FINDINGS: CTA CHEST FINDINGS Cardiovascular: Satisfactory opacification of the pulmonary arteries to the segmental level. No evidence of pulmonary embolism. Cardiomegaly. No pericardial effusion. Coronary stenting and calcification. Aortic atherosclerotic calcification. Mediastinum/Nodes: Right hilar and subcarinal adenopathy similar in size to 08/29/2021 where they demonstrated increased FDG avidity. Normal thyroid. Unremarkable esophagus. The central airways are patent. Lungs/Pleura: Irregular right upper lobe mass now measures 4.3 x 4.4 cm, not significantly changed from 08/29/2021. Scarring/atelectasis in the lung bases. Emphysema. No pleural effusion or pneumothorax. Musculoskeletal: No chest wall abnormality. No acute or significant osseous findings. Review of the MIP images confirms  the above findings. CT ABDOMEN and PELVIS FINDINGS Hepatobiliary: No suspicious focal liver abnormality is seen. No gallstones, gallbladder wall thickening, or biliary dilatation. Pancreas: Unremarkable. No pancreatic ductal dilatation or surrounding inflammatory changes. Spleen: Normal in size without focal abnormality. Adrenals/Urinary Tract: Adrenal glands are unremarkable. Kidneys are normal, without renal calculi, suspicious focal lesion, or hydronephrosis. Unchanged bilateral renal cysts not requiring follow-up. Bladder is unremarkable. Stomach/Bowel: Stomach and small bowel within normal limits. Colonic diverticulosis greatest within the descending colon. Wall thickening and adjacent inflammatory stranding about the descending colon compatible with diverticulitis. No drainable fluid collection. No free intraperitoneal air. Moderate colonic stool load. Normal appendix. Vascular/Lymphatic: Aortic atherosclerosis. No enlarged abdominal or pelvic lymph nodes. Reproductive: Enlarged prostate containing metal clips. Other: Small fat containing umbilical hernia. Musculoskeletal: No acute or significant osseous findings. Review of the MIP images confirms the above findings. IMPRESSION: Chest: 1. Negative for acute pulmonary embolism. 2. Unchanged size of the 4.8 cm right upper lobe mass consistent with primary bronchogenic carcinoma. 3. Similar right hilar and subcarinal lymphadenopathy suspicious for metastases. 4. Aortic Atherosclerosis (ICD10-I70.0) and Emphysema (ICD10-J43.9). Abdomen/pelvis: 1. Acute uncomplicated diverticulitis of the descending colon. Electronically Signed   By: Placido Sou M.D.   On: 10/15/2021 19:33   CT ABDOMEN PELVIS W CONTRAST  Result Date: 10/15/2021 CLINICAL DATA:  High probability pulmonary embolism, left flank pain EXAM: CT ANGIOGRAPHY CHEST CT ABDOMEN AND PELVIS WITH CONTRAST TECHNIQUE: Multidetector CT imaging of the chest was performed using the standard protocol during  bolus administration of intravenous contrast. Multiplanar CT image reconstructions and MIPs were obtained to evaluate the vascular anatomy. Multidetector CT imaging of the abdomen and pelvis was performed using the standard protocol during bolus administration of intravenous contrast. RADIATION DOSE REDUCTION: This exam was performed according to the departmental dose-optimization program which includes automated exposure control, adjustment of the mA and/or kV according to patient size and/or use of iterative reconstruction technique. CONTRAST:  173mL OMNIPAQUE IOHEXOL 350 MG/ML SOLN COMPARISON:  PET/CT 08/29/2021 and CT abdomen and pelvis 06/19/2021 FINDINGS: CTA CHEST FINDINGS Cardiovascular: Satisfactory opacification of the pulmonary arteries to the segmental level. No evidence of pulmonary embolism. Cardiomegaly. No pericardial effusion. Coronary stenting and calcification. Aortic atherosclerotic calcification. Mediastinum/Nodes: Right hilar and subcarinal  adenopathy similar in size to 08/29/2021 where they demonstrated increased FDG avidity. Normal thyroid. Unremarkable esophagus. The central airways are patent. Lungs/Pleura: Irregular right upper lobe mass now measures 4.3 x 4.4 cm, not significantly changed from 08/29/2021. Scarring/atelectasis in the lung bases. Emphysema. No pleural effusion or pneumothorax. Musculoskeletal: No chest wall abnormality. No acute or significant osseous findings. Review of the MIP images confirms the above findings. CT ABDOMEN and PELVIS FINDINGS Hepatobiliary: No suspicious focal liver abnormality is seen. No gallstones, gallbladder wall thickening, or biliary dilatation. Pancreas: Unremarkable. No pancreatic ductal dilatation or surrounding inflammatory changes. Spleen: Normal in size without focal abnormality. Adrenals/Urinary Tract: Adrenal glands are unremarkable. Kidneys are normal, without renal calculi, suspicious focal lesion, or hydronephrosis. Unchanged bilateral  renal cysts not requiring follow-up. Bladder is unremarkable. Stomach/Bowel: Stomach and small bowel within normal limits. Colonic diverticulosis greatest within the descending colon. Wall thickening and adjacent inflammatory stranding about the descending colon compatible with diverticulitis. No drainable fluid collection. No free intraperitoneal air. Moderate colonic stool load. Normal appendix. Vascular/Lymphatic: Aortic atherosclerosis. No enlarged abdominal or pelvic lymph nodes. Reproductive: Enlarged prostate containing metal clips. Other: Small fat containing umbilical hernia. Musculoskeletal: No acute or significant osseous findings. Review of the MIP images confirms the above findings. IMPRESSION: Chest: 1. Negative for acute pulmonary embolism. 2. Unchanged size of the 4.8 cm right upper lobe mass consistent with primary bronchogenic carcinoma. 3. Similar right hilar and subcarinal lymphadenopathy suspicious for metastases. 4. Aortic Atherosclerosis (ICD10-I70.0) and Emphysema (ICD10-J43.9). Abdomen/pelvis: 1. Acute uncomplicated diverticulitis of the descending colon. Electronically Signed   By: Placido Sou M.D.   On: 10/15/2021 19:33     Assessment and plan- Patient is a 76 y.o. 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  He is here for on treatment assessment prior to cycle 4 of weekly CarboTaxol chemotherapy  We did reach out to radiation oncology and they feel that the field of radiation is not consistent with patient's symptoms that can be attribute it to radiation esophagitis.  I am therefore sending the patient to ER for further evaluation of chest pain.  He did get 4 mg IV morphine in our office today.  I am sending him home with a prescription of pantoprazole 20 mg daily as well as as needed oxycodone 5 mg every 6 hours as needed.  Depending on his hospitalization we will decide when we can restart his chemotherapy.  He is not receiving chemo  today.  Chemo-induced neutropenia: Neupogen has been approved showed patient gets his chemotherapy next week.   Visit Diagnosis 1. Malignant neoplasm of upper lobe of right lung ([place])   2. Other chest pain      Dr. Randa Evens, MD, MPH [facility] [phone] 10/30/2021 1:36 PM

## 2021-10-30 NOTE — ED Triage Notes (Signed)
Pt states he ate a pancake this morning and thinks it may be indigestion.

## 2021-10-30 NOTE — Progress Notes (Signed)
ON PATHWAY REGIMEN - Non-Small Cell Lung  No Change  Continue With Treatment as Ordered.  Original Decision Date/Time: 09/18/2021 16:41     A cycle is every 7 days, concurrent with RT:     Paclitaxel      Carboplatin   **Always confirm dose/schedule in your pharmacy ordering system**  Patient Characteristics: Preoperative or Nonsurgical Candidate (Clinical Staging), Stage II, Nonsurgical Candidate Therapeutic Status: Preoperative or Nonsurgical Candidate (Clinical Staging) AJCC T Category: cT2 AJCC N Category: cN1 AJCC M Category: cM0 AJCC 8 Stage Grouping: IIB Intent of Therapy: Curative Intent, Discussed with Patient

## 2021-10-30 NOTE — Discharge Instructions (Signed)
We suspect that your chest pain is due to acid reflux.  Take the pantoprazole that was prescribed by your doctor.  You should take this daily as prescribed whether or not you are having symptoms at that time as this will decrease stomach acid.  The Carafate that we prescribed today you may take just as needed if you are having a flareup of the pain, or after eating.  Return to the ER for new, worsening, or persistent severe chest pain, difficulty breathing, weakness or lightheadedness, or any other new or worsening symptoms that concern you.

## 2021-10-30 NOTE — Progress Notes (Signed)
Pt arrived to infusion suite c/o dizziness, 10/10 chest pain and SOB. Dr. Janese Banks aware, 1L IVF ordered and iv morphine ordered. EKG ordered for after fluid bolus complete  1037 Per Dr Janese Banks, send to ER as soon as morphine given.   1040. Pt taken to ER.  1050: Pt pain 4-5/10 upon arrival to ER. States SOB improved.

## 2021-10-30 NOTE — ED Provider Notes (Signed)
[facility] Provider Note    Event Date/Time   First MD Initiated Contact with Patient 10/30/21 1231     (approximate)   History   Chest Pain   HPI  Luke Rojas is a 76 y.o. male with a history of non-small cell lung cancer currently receiving chemo and radiation, CAD, COPD,  and diabetes, who presents with chest pain.  The patient has had some intermittent chest pain over the last several days which he describes as burning in quality.  He normally only eats small meals multiple times throughout the day, but a few days ago had a large meal of catfish and states that the chest pain started after this.  It was not improved by Tums but did improve when he lie down.  Today the patient went for radiation and chemo.  After the radiation the pain became acutely more severe and he states that is the worst pain he has ever felt.  His chemotherapy treatment was aborted.  He states that the pain is now completely resolved.  He had some associated lightheadedness but denies any associated shortness of breath, nausea or vomiting, cough, or fever.  Intermittent burning chest pain for the last several days, started after he ate catfish, had an acute episode this morning after getting radiation, now completely resolved he states he takes pantoprazole for GERD.   Physical Exam   Triage Vital Signs: ED Triage Vitals [10/30/21 1120]  Enc Vitals Group     BP 97/60     Pulse Rate 80     Resp 20     Temp 98.2 F (36.8 C)     Temp Source Oral     SpO2 94 %     Weight      Height      Head Circumference      Peak Flow      Pain Score      Pain Loc      Pain Edu?      Excl. in [place]?     Most recent vital signs: Vitals:   10/30/21 1120 10/30/21 1422  BP: 97/60 100/63  Pulse: 80 78  Resp: 20 18  Temp: 98.2 F (36.8 C)   SpO2: 94% 94%     General: Awake, no distress.  CV:  Good peripheral perfusion.  Normal heart sounds. Resp:  Normal effort.  Lung  CTAB. Abd:  No distention.  Other:  No significant peripheral edema.   ED Results / Procedures / Treatments   Labs (all labs ordered are listed, but only abnormal results are displayed) Labs Reviewed  BASIC METABOLIC PANEL - Abnormal; Notable for the following components:      Result Value   CO2 20 (*)    Glucose, Bld 103 (*)    All other components within normal limits  CBC - Abnormal; Notable for the following components:   WBC 3.1 (*)    RBC 3.74 (*)    Hemoglobin 10.2 (*)    HCT 31.0 (*)    RDW 16.1 (*)    All other components within normal limits  TROPONIN I (HIGH SENSITIVITY)  TROPONIN I (HIGH SENSITIVITY)     EKG  ED ECG REPORT I, Arta Silence, the attending physician, personally viewed and interpreted this ECG.  Date: 10/30/2021 EKG Time: 1123 Rate: 81 Rhythm: normal sinus rhythm QRS Axis: normal Intervals: normal ST/T Wave abnormalities: Nonspecific T wave abnormalities Narrative Interpretation: Nonspecific abnormalities with no evidence of acute ischemia;  no significant change when compared to EKG of 10/15/2021    RADIOLOGY  Chest x-ray: I independently viewed and interpreted the images; there is no focal consolidation or edema  PROCEDURES:  Critical Care performed: No  Procedures   MEDICATIONS ORDERED IN ED: Medications  heparin lock flush 100 UNIT/ML injection (has no administration in time range)     IMPRESSION / MDM / ASSESSMENT AND PLAN / ED COURSE  I reviewed the triage vital signs and the nursing notes.  76 year old male with PMH as noted above presents with intermittent chest pain over the last few days with an acutely worsened episode this morning.  The pain has now completely resolved.  I reviewed the past medical records.  The patient follows with Dr. Janese Banks for non-small cell lung cancer and is receiving chemotherapy and radiation.  He was most recently admitted earlier this month.  Per the hospitalist discharge summary from  10/17/2021 he presented with left-sided flank pain and was diagnosed with diverticulitis.  On exam currently, the patient is well-appearing.  His vital signs are normal.  Physical exam is otherwise unremarkable.  EKG is nonischemic and unchanged from prior.  Chest x-ray shows no acute findings.  Initial troponin is negative.  Differential diagnosis includes, but is not limited to, GERD, gastritis, side effects of radiation, musculoskeletal chest wall pain, nerve pain, less likely ACS.  The patient does have a history of CAD but the pain is highly atypical and seems to have been set off by food.  There is no clinical evidence for PE, aortic dissection, or other vascular cause given the normal vital signs and the fact that the pain resolved spontaneously.  Patient's presentation is most consistent with acute presentation with potential threat to life or bodily function.  We will obtain a repeat troponin.  The patient expresses a strong desire to go home.  If he remains pain-free and the repeat troponin is negative I think that this is reasonable.  The patient is on the cardiac monitor to evaluate for evidence of arrhythmia and/or significant heart rate changes.   ----------------------------------------- 3:09 PM on 10/30/2021 -----------------------------------------  Repeat troponin is negative.  The patient remains pain-free.  He is stable for discharge at this time.  I gave him strict return precautions and he expresses understanding.  He has been prescribed pantoprazole and I have also prescribed Carafate if needed for breakthrough symptoms.   FINAL CLINICAL IMPRESSION(S) / ED DIAGNOSES   Final diagnoses:  Gastroesophageal reflux disease without esophagitis  Atypical chest pain     Rx / DC Orders   ED Discharge Orders          Ordered    sucralfate (CARAFATE) 1 g tablet  4 times daily PRN        10/30/21 1508             Note:  This document was prepared using Dragon voice  recognition software and may include unintentional dictation errors.    Arta Silence, MD 10/30/21 1510

## 2021-10-30 NOTE — Progress Notes (Signed)
Pt states that he has been experiencing severe heartburn for the past couple of days. Per wife; Dr. Osborne Casco has decreased his BP medication down to 5mg  due to feet swelling last Thursday. Pt feels somewhat dizzy and weak this morning.

## 2021-10-30 NOTE — Telephone Encounter (Signed)
Left VM with patient to inform him of infusion time on 8/29. Requested he call back with any questions of concerns.

## 2021-10-30 NOTE — ED Provider Triage Note (Signed)
Emergency Medicine Provider Triage Evaluation Note  Luke Rojas , a 76 y.o. male  was evaluated in triage.  Pt complains of anterior chest pain for several days. Took Tums yesterday.  Today got worse in cancer center.    Review of Systems  Positive: CP, "indigestion" SOB Negative: No fever, N/V  Physical Exam  BP 97/60 (BP Location: Left Arm)   Pulse 80   Temp 98.2 F (36.8 C) (Oral)   Resp 20   SpO2 94%  Gen:   Awake, no distress  uncomfortable and moaning Resp:  Normal effort Lungs Clear.   MSK:   Moves extremities without difficulty  Other:    Medical Decision Making  Medically screening exam initiated at 11:31 AM.  Appropriate orders placed.  Luke Rojas was informed that the remainder of the evaluation will be completed by another provider, this initial triage assessment does not replace that evaluation, and the importance of remaining in the ED until their evaluation is complete.     Johnn Hai, PA-C 10/30/21 1135

## 2021-10-30 NOTE — ED Notes (Signed)
Port de accessed and flushed with Heparin 5 ml of 100 u/ml.

## 2021-10-30 NOTE — ED Triage Notes (Signed)
Pt complains of chest pain on and off for several days. Pt had radiation this morning at 9am and said after radiation pain was worse. Was supposed to have chemo but was told to come to ED for evaluation of chest pain. No SOB.

## 2021-10-31 ENCOUNTER — Ambulatory Visit
Admission: RE | Admit: 2021-10-31 | Discharge: 2021-10-31 | Disposition: A | Discharge status: Home / Self Care | Payer: Medicare Other | Source: Ambulatory Visit | Attending: Radiation Oncology | Admitting: Radiation Oncology

## 2021-10-31 ENCOUNTER — Encounter: Payer: Self-pay | Admitting: *Deleted

## 2021-10-31 ENCOUNTER — Other Ambulatory Visit: Payer: Self-pay

## 2021-10-31 ENCOUNTER — Inpatient Hospital Stay: Payer: Medicare Other

## 2021-10-31 ENCOUNTER — Telehealth: Payer: Self-pay | Admitting: *Deleted

## 2021-10-31 VITALS — BP 102/76 | HR 93 | Temp 97.0°F | Resp 18

## 2021-10-31 DIAGNOSIS — Z5111 Encounter for antineoplastic chemotherapy: Secondary | ICD-10-CM | POA: Diagnosis not present

## 2021-10-31 DIAGNOSIS — Z510 Encounter for antineoplastic radiation therapy: Secondary | ICD-10-CM | POA: Diagnosis not present

## 2021-10-31 DIAGNOSIS — Z87891 Personal history of nicotine dependence: Secondary | ICD-10-CM | POA: Diagnosis not present

## 2021-10-31 DIAGNOSIS — D701 Agranulocytosis secondary to cancer chemotherapy: Secondary | ICD-10-CM | POA: Diagnosis not present

## 2021-10-31 DIAGNOSIS — C3411 Malignant neoplasm of upper lobe, right bronchus or lung: Secondary | ICD-10-CM | POA: Diagnosis not present

## 2021-10-31 DIAGNOSIS — R0789 Other chest pain: Secondary | ICD-10-CM | POA: Diagnosis not present

## 2021-10-31 LAB — RAD ONC ARIA SESSION SUMMARY
Course Elapsed Days: 27
Plan Fractions Treated to Date: 18
Plan Prescribed Dose Per Fraction: 2 Gy
Plan Total Fractions Prescribed: 35
Plan Total Prescribed Dose: 70 Gy
Reference Point Dosage Given to Date: 36 Gy
Reference Point Session Dosage Given: 2 Gy
Session Number: 18

## 2021-10-31 MED ORDER — DIPHENHYDRAMINE HCL 50 MG/ML IJ SOLN
50.0000 mg | Freq: Once | INTRAMUSCULAR | Status: AC
  Administered 2021-10-31: 50 mg via INTRAVENOUS
  Filled 2021-10-31: qty 1

## 2021-10-31 MED ORDER — SODIUM CHLORIDE 0.9 % IV SOLN
160.2000 mg | Freq: Once | INTRAVENOUS | Status: AC
  Administered 2021-10-31: 160 mg via INTRAVENOUS
  Filled 2021-10-31: qty 16

## 2021-10-31 MED ORDER — HEPARIN SOD (PORK) LOCK FLUSH 100 UNIT/ML IV SOLN
500.0000 [IU] | Freq: Once | INTRAVENOUS | Status: AC | PRN
  Filled 2021-10-31: qty 5

## 2021-10-31 MED ORDER — SODIUM CHLORIDE 0.9 % IV SOLN
45.0000 mg/m2 | Freq: Once | INTRAVENOUS | Status: AC
  Administered 2021-10-31: 84 mg via INTRAVENOUS
  Filled 2021-10-31: qty 14

## 2021-10-31 MED ORDER — PALONOSETRON HCL INJECTION 0.25 MG/5ML
0.2500 mg | Freq: Once | INTRAVENOUS | Status: AC
  Administered 2021-10-31: 0.25 mg via INTRAVENOUS
  Filled 2021-10-31: qty 5

## 2021-10-31 MED ORDER — FAMOTIDINE IN NACL 20-0.9 MG/50ML-% IV SOLN
20.0000 mg | Freq: Once | INTRAVENOUS | Status: AC
  Administered 2021-10-31: 20 mg via INTRAVENOUS
  Filled 2021-10-31: qty 50

## 2021-10-31 MED ORDER — HEPARIN SOD (PORK) LOCK FLUSH 100 UNIT/ML IV SOLN
INTRAVENOUS | Status: AC
  Administered 2021-10-31: 500 [IU]
  Filled 2021-10-31: qty 5

## 2021-10-31 MED ORDER — PANTOPRAZOLE SODIUM 20 MG PO TBEC: 40.0000 mg | DELAYED_RELEASE_TABLET | Freq: Every day | ORAL | 2 refills | Status: DC

## 2021-10-31 MED ORDER — SODIUM CHLORIDE 0.9 % IV SOLN
Freq: Once | INTRAVENOUS | Status: AC
  Filled 2021-10-31: qty 250

## 2021-10-31 MED ORDER — SODIUM CHLORIDE 0.9 % IV SOLN
10.0000 mg | Freq: Once | INTRAVENOUS | Status: AC
  Administered 2021-10-31: 10 mg via INTRAVENOUS
  Filled 2021-10-31: qty 1

## 2021-10-31 NOTE — Progress Notes (Signed)
Spoke with patient during infusion today and he states that he is feeling better today. Continues to have dizziness when ambulating. Encouraged pt to continue drinking plenty of fluids and to rest frequently throughout the day. Pt's wife stated that they were able to pick up his prescriptions yesterday. Pt's wife stated that pt was previously taking protonix prescribed by Dr. Clayborn Bigness but that he recently ran out and she is asking if pt can continue at the same dose. Informed that pt may take 2 tablets of protonix to equal total dose of 40mg  since that was what he was on previously. Will make Dr. Janese Banks aware and update med list. No further questions or needs at this time.

## 2021-10-31 NOTE — Patient Instructions (Signed)
[facility]  Discharge Instructions: Thank you for choosing [facility] to provide your oncology and hematology care.  If you have a lab appointment with the Cancer Center, please go directly to the Cancer Center and check in at the registration area.  Wear comfortable clothing and clothing appropriate for easy access to any Portacath or PICC line.   We strive to give you quality time with your provider. You may need to reschedule your appointment if you arrive late (15 or more minutes).  Arriving late affects you and other patients whose appointments are after yours.  Also, if you miss three or more appointments without notifying the office, you may be dismissed from the clinic at the provider's discretion.      For prescription refill requests, have your pharmacy contact our office and allow 72 hours for refills to be completed.    Today you received the following chemotherapy and/or immunotherapy agents Taxol and Carboplatin       To help prevent nausea and vomiting after your treatment, we encourage you to take your nausea medication as directed.  BELOW ARE SYMPTOMS THAT SHOULD BE REPORTED IMMEDIATELY: *FEVER GREATER THAN 100.4 F (38 C) OR HIGHER *CHILLS OR SWEATING *NAUSEA AND VOMITING THAT IS NOT CONTROLLED WITH YOUR NAUSEA MEDICATION *UNUSUAL SHORTNESS OF BREATH *UNUSUAL BRUISING OR BLEEDING *URINARY PROBLEMS (pain or burning when urinating, or frequent urination) *BOWEL PROBLEMS (unusual diarrhea, constipation, pain near the anus) TENDERNESS IN MOUTH AND THROAT WITH OR WITHOUT PRESENCE OF ULCERS (sore throat, sores in mouth, or a toothache) UNUSUAL RASH, SWELLING OR PAIN  UNUSUAL VAGINAL DISCHARGE OR ITCHING   Items with * indicate a potential emergency and should be followed up as soon as possible or go to the Emergency Department if any problems should occur.  Please show the CHEMOTHERAPY ALERT CARD or IMMUNOTHERAPY ALERT CARD at  check-in to the Emergency Department and triage nurse.  Should you have questions after your visit or need to cancel or reschedule your appointment, please contact [facility]  [phone] and follow the prompts.  Office hours are 8:00 a.m. to 4:30 p.m. Monday - Friday. Please note that voicemails left after 4:00 p.m. may not be returned until the following business day.  We are closed weekends and major holidays. You have access to a nurse at all times for urgent questions. Please call the main number to the clinic [phone] and follow the prompts.  For any non-urgent questions, you may also contact your provider using MyChart. We now offer e-Visits for anyone 18 and older to request care online for non-urgent symptoms. For details visit [email or web address].   Also download the MyChart app! Go to the app store, search "MyChart", open the app, select [place], and log in with your MyChart username and password.  Masks are optional in the cancer centers. If you would like for your care team to wear a mask while they are taking care of you, please let them know. For doctor visits, patients may have with them one support person who is at least 77 years old. At this time, visitors are not allowed in the infusion area.  

## 2021-10-31 NOTE — Telephone Encounter (Signed)
Called pt and let him know that he can come tomorrow 3 pm for radiation and then go to get injection in the lab. He understands and he knows to check in at the desk to register to get the inj.

## 2021-11-01 ENCOUNTER — Ambulatory Visit
Admission: RE | Admit: 2021-11-01 | Discharge: 2021-11-01 | Disposition: A | Discharge status: Home / Self Care | Payer: Medicare Other | Source: Ambulatory Visit | Attending: Radiation Oncology | Admitting: Radiation Oncology

## 2021-11-01 ENCOUNTER — Other Ambulatory Visit: Payer: Self-pay

## 2021-11-01 ENCOUNTER — Inpatient Hospital Stay: Payer: Medicare Other

## 2021-11-01 DIAGNOSIS — Z87891 Personal history of nicotine dependence: Secondary | ICD-10-CM | POA: Diagnosis not present

## 2021-11-01 DIAGNOSIS — Z510 Encounter for antineoplastic radiation therapy: Secondary | ICD-10-CM | POA: Diagnosis not present

## 2021-11-01 DIAGNOSIS — C3411 Malignant neoplasm of upper lobe, right bronchus or lung: Secondary | ICD-10-CM | POA: Diagnosis not present

## 2021-11-01 DIAGNOSIS — D701 Agranulocytosis secondary to cancer chemotherapy: Secondary | ICD-10-CM | POA: Diagnosis not present

## 2021-11-01 DIAGNOSIS — R0789 Other chest pain: Secondary | ICD-10-CM | POA: Diagnosis not present

## 2021-11-01 DIAGNOSIS — Z5111 Encounter for antineoplastic chemotherapy: Secondary | ICD-10-CM | POA: Diagnosis not present

## 2021-11-01 LAB — RAD ONC ARIA SESSION SUMMARY
Course Elapsed Days: 28
Plan Fractions Treated to Date: 19
Plan Prescribed Dose Per Fraction: 2 Gy
Plan Total Fractions Prescribed: 35
Plan Total Prescribed Dose: 70 Gy
Reference Point Dosage Given to Date: 38 Gy
Reference Point Session Dosage Given: 2 Gy
Session Number: 19

## 2021-11-01 MED ORDER — FILGRASTIM-SNDZ 300 MCG/0.5ML IJ SOSY
300.0000 ug | PREFILLED_SYRINGE | Freq: Every day | INTRAMUSCULAR | Status: DC
  Administered 2021-11-01: 300 ug via SUBCUTANEOUS
  Filled 2021-11-01: qty 0.5

## 2021-11-02 ENCOUNTER — Inpatient Hospital Stay: Payer: Medicare Other

## 2021-11-02 ENCOUNTER — Ambulatory Visit
Admission: RE | Admit: 2021-11-02 | Discharge: 2021-11-02 | Disposition: A | Discharge status: Home / Self Care | Payer: Medicare Other | Source: Ambulatory Visit | Attending: Radiation Oncology | Admitting: Radiation Oncology

## 2021-11-02 ENCOUNTER — Other Ambulatory Visit: Payer: Self-pay

## 2021-11-02 DIAGNOSIS — D701 Agranulocytosis secondary to cancer chemotherapy: Secondary | ICD-10-CM | POA: Diagnosis not present

## 2021-11-02 DIAGNOSIS — R0789 Other chest pain: Secondary | ICD-10-CM | POA: Diagnosis not present

## 2021-11-02 DIAGNOSIS — Z510 Encounter for antineoplastic radiation therapy: Secondary | ICD-10-CM | POA: Diagnosis not present

## 2021-11-02 DIAGNOSIS — C3411 Malignant neoplasm of upper lobe, right bronchus or lung: Secondary | ICD-10-CM

## 2021-11-02 DIAGNOSIS — Z87891 Personal history of nicotine dependence: Secondary | ICD-10-CM | POA: Diagnosis not present

## 2021-11-02 DIAGNOSIS — Z5111 Encounter for antineoplastic chemotherapy: Secondary | ICD-10-CM | POA: Diagnosis not present

## 2021-11-02 LAB — RAD ONC ARIA SESSION SUMMARY
Course Elapsed Days: 29
Plan Fractions Treated to Date: 20
Plan Prescribed Dose Per Fraction: 2 Gy
Plan Total Fractions Prescribed: 35
Plan Total Prescribed Dose: 70 Gy
Reference Point Dosage Given to Date: 40 Gy
Reference Point Session Dosage Given: 2 Gy
Session Number: 20

## 2021-11-02 MED ORDER — FILGRASTIM-SNDZ 300 MCG/0.5ML IJ SOSY
300.0000 ug | PREFILLED_SYRINGE | Freq: Every day | INTRAMUSCULAR | Status: DC
  Administered 2021-11-02: 300 ug via SUBCUTANEOUS
  Filled 2021-11-02: qty 0.5

## 2021-11-03 ENCOUNTER — Other Ambulatory Visit: Payer: Self-pay

## 2021-11-03 ENCOUNTER — Ambulatory Visit
Admission: RE | Admit: 2021-11-03 | Discharge: 2021-11-03 | Disposition: A | Discharge status: Home / Self Care | Payer: Medicare Other | Source: Ambulatory Visit | Attending: Radiation Oncology | Admitting: Radiation Oncology

## 2021-11-03 ENCOUNTER — Inpatient Hospital Stay: Payer: Medicare Other

## 2021-11-03 DIAGNOSIS — Z9221 Personal history of antineoplastic chemotherapy: Secondary | ICD-10-CM | POA: Insufficient documentation

## 2021-11-03 DIAGNOSIS — D6481 Anemia due to antineoplastic chemotherapy: Secondary | ICD-10-CM | POA: Insufficient documentation

## 2021-11-03 DIAGNOSIS — E871 Hypo-osmolality and hyponatremia: Secondary | ICD-10-CM | POA: Insufficient documentation

## 2021-11-03 DIAGNOSIS — Z87891 Personal history of nicotine dependence: Secondary | ICD-10-CM | POA: Insufficient documentation

## 2021-11-03 DIAGNOSIS — Z79899 Other long term (current) drug therapy: Secondary | ICD-10-CM | POA: Insufficient documentation

## 2021-11-03 DIAGNOSIS — Z510 Encounter for antineoplastic radiation therapy: Secondary | ICD-10-CM | POA: Insufficient documentation

## 2021-11-03 DIAGNOSIS — I951 Orthostatic hypotension: Secondary | ICD-10-CM | POA: Insufficient documentation

## 2021-11-03 DIAGNOSIS — Z5111 Encounter for antineoplastic chemotherapy: Secondary | ICD-10-CM | POA: Insufficient documentation

## 2021-11-03 DIAGNOSIS — F17220 Nicotine dependence, chewing tobacco, uncomplicated: Secondary | ICD-10-CM | POA: Diagnosis not present

## 2021-11-03 DIAGNOSIS — R072 Precordial pain: Secondary | ICD-10-CM | POA: Insufficient documentation

## 2021-11-03 DIAGNOSIS — D701 Agranulocytosis secondary to cancer chemotherapy: Secondary | ICD-10-CM | POA: Insufficient documentation

## 2021-11-03 DIAGNOSIS — C3411 Malignant neoplasm of upper lobe, right bronchus or lung: Secondary | ICD-10-CM

## 2021-11-03 LAB — RAD ONC ARIA SESSION SUMMARY
Course Elapsed Days: 30
Plan Fractions Treated to Date: 21
Plan Prescribed Dose Per Fraction: 2 Gy
Plan Total Fractions Prescribed: 35
Plan Total Prescribed Dose: 70 Gy
Reference Point Dosage Given to Date: 42 Gy
Reference Point Session Dosage Given: 2 Gy
Session Number: 21

## 2021-11-03 MED ORDER — FILGRASTIM-SNDZ 300 MCG/0.5ML IJ SOSY
300.0000 ug | PREFILLED_SYRINGE | Freq: Every day | INTRAMUSCULAR | Status: DC
  Administered 2021-11-03: 300 ug via SUBCUTANEOUS
  Filled 2021-11-03: qty 0.5

## 2021-11-05 ENCOUNTER — Encounter (INDEPENDENT_AMBULATORY_CARE_PROVIDER_SITE_OTHER): Payer: Self-pay | Admitting: Nurse Practitioner

## 2021-11-05 NOTE — Progress Notes (Signed)
Subjective:    Patient ID: Luke Rojas, male    DOB: 02-19-1946, 76 y.o.   MRN: [id number] No chief complaint on file.   The patient is seen for follow up evaluation of carotid stenosis status post right carotid stent on 09/11/21.  There were no post operative problems or complications related to the surgery.  The patient denies neck or incisional pain.  The patient denies interval amaurosis fugax. There is no recent history of TIA symptoms or focal motor deficits. There is no prior documented CVA.  The patient denies headache.  The patient is taking enteric-coated aspirin 81 mg daily.  No recent shortening of the patient's walking distance or new symptoms consistent with claudication.  No history of rest pain symptoms. No new ulcers or wounds of the lower extremities have occurred.  There is no history of DVT, PE or superficial thrombophlebitis. No recent episodes of angina or shortness of breath documented.   Noninvasive studies show a patent right carotid stent with a 1 to 39% left ICA stenosis.      Review of Systems     Objective:   Physical Exam  BP 114/71 (BP Location: Right Arm)   Pulse 80   Resp 18   Ht 5\' 5"  (1.651 m)   Wt 167 lb (75.8 kg)   BMI 27.79 kg/m   Past Medical History:  Diagnosis Date   Arthritis    OSTEOARTHRITIS   Chronic kidney disease    kidney stones   COPD (chronic obstructive pulmonary disease) (HCC)    Coronary artery disease    2 stents    Diabetes mellitus without complication (HCC)    GERD (gastroesophageal reflux disease)    Hypercholesteremia    Hypertension    Lung cancer (HCC)    Myocardial infarction ([place]) 1308   Renal colic     Social History   Socioeconomic History   Marital status: Married    Spouse name: Not on file   Number of children: 2   Years of education: Not on file   Highest education level: 8th grade  Occupational History   Occupation: retired  Tobacco Use   Smoking status: Former     Packs/day: 1.00    Years: 30.00    Total pack years: 30.00    Types: Cigarettes    Quit date: 03/05/1984    Years since quitting: 37.6   Smokeless tobacco: Current    Types: Chew   Tobacco comments:    Chews tobacco daily.  Vaping Use   Vaping Use: Never used  Substance and Sexual Activity   Alcohol use: No    Alcohol/week: 0.0 standard drinks of alcohol   Drug use: No   Sexual activity: Not Currently  Other Topics Concern   Not on file  Social History Narrative   Lives with Zigmund Daniel, wife, and son [place].   Social Determinants of Health   Financial Resource Strain: Low Risk  (04/07/2020)   Overall Financial Resource Strain (CARDIA)    Difficulty of Paying Living Expenses: Not hard at all  Food Insecurity: No Food Insecurity (04/07/2020)   Hunger Vital Sign    Worried About Running Out of Food in the Last Year: Never true    Ran Out of Food in the Last Year: Never true  Transportation Needs: No Transportation Needs (04/07/2020)   PRAPARE - Hydrologist (Medical): No    Lack of Transportation (Non-Medical): No  Physical Activity: Inactive (04/07/2020)  Exercise Vital Sign    Days of Exercise per Week: 0 days    Minutes of Exercise per Session: 0 min  Stress: No Stress Concern Present (04/07/2020)   [place]    Feeling of Stress : Not at all  Social Connections: Moderately Integrated (04/07/2020)   Social Connection and Isolation Panel [NHANES]    Frequency of Communication with Friends and Family: More than three times a week    Frequency of Social Gatherings with Friends and Family: More than three times a week    Attends Religious Services: More than 4 times per year    Active Member of [employer] or Organizations: No    Attends Archivist Meetings: Never    Marital Status: Married  Human resources officer Violence: Not At Risk (04/07/2020)   Humiliation, Afraid, Rape, and Kick questionnaire     Fear of Current or Ex-Partner: No    Emotionally Abused: No    Physically Abused: No    Sexually Abused: No    Past Surgical History:  Procedure Laterality Date   CARDIAC CATHETERIZATION  2013   2 stents   CAROTID PTA/STENT INTERVENTION Right 09/11/2021   Procedure: CAROTID PTA/STENT INTERVENTION;  Surgeon: Algernon Huxley, MD;  Location: [facility];  Service: Cardiovascular;  Laterality: Right;   CATARACT EXTRACTION, BILATERAL     COLONOSCOPY  04/27/05   COLONOSCOPY WITH PROPOFOL N/A 06/08/2015   Procedure: COLONOSCOPY WITH PROPOFOL;  Surgeon: Robert Bellow, MD;  Location: [facility];  Service: Endoscopy;  Laterality: N/A;   CYSTOSCOPY WITH INSERTION OF UROLIFT N/A 02/11/2020   Procedure: CYSTOSCOPY WITH INSERTION OF UROLIFT;  Surgeon: Royston Cowper, MD;  Location: [facility];  Service: Urology;  Laterality: N/A;   CYSTOSCOPY WITH STENT PLACEMENT Right 09/16/2016   Procedure: CYSTOSCOPY WITH STENT PLACEMENT;  Surgeon: Royston Cowper, MD;  Location: [facility];  Service: Urology;  Laterality: Right;   EXTRACORPOREAL SHOCK WAVE LITHOTRIPSY Left 11/04/2014   has had 2 previous lithotripsies   EXTRACORPOREAL SHOCK WAVE LITHOTRIPSY Left 03/24/2015   Procedure: EXTRACORPOREAL SHOCK WAVE LITHOTRIPSY (ESWL);  Surgeon: Royston Cowper, MD;  Location: [facility];  Service: Urology;  Laterality: Left;   EXTRACORPOREAL SHOCK WAVE LITHOTRIPSY Right 10/04/2016   Procedure: EXTRACORPOREAL SHOCK WAVE LITHOTRIPSY (ESWL);  Surgeon: Royston Cowper, MD;  Location: [facility];  Service: Urology;  Laterality: Right;   LEFT HEART CATH AND CORONARY ANGIOGRAPHY N/A 12/05/2018   Procedure: LEFT HEART CATH AND CORONARY ANGIOGRAPHY with possible pci and stent;  Surgeon: Yolonda Kida, MD;  Location: [facility];  Service: Cardiovascular;  Laterality: N/A;   PORTA CATH INSERTION Right 09/25/2021   Procedure: PORTA CATH INSERTION;  Surgeon: Algernon Huxley, MD;  Location: [facility];  Service:  Cardiovascular;  Laterality: Right;    Family History  Problem Relation Age of Onset   Pulmonary embolism Mother    Transient ischemic attack Mother    Diabetes Mother    Pancreatic cancer Father    Hypertension Father    Diabetes Father    Cirrhosis Brother     Allergies  Allergen Reactions   Codeine Nausea Only, Nausea And Vomiting and Other (See Comments)    Other reaction(s): Vomiting       Latest Ref Rng & Units 10/30/2021   11:59 AM 10/30/2021    9:22 AM 10/23/2021    9:17 AM  CBC  WBC 4.0 - 10.5 K/uL 3.1  2.4  3.2   Hemoglobin 13.0 - 17.0 g/dL 10.2  11.0  10.8   Hematocrit 39.0 - 52.0 % 31.0  33.1  32.6   Platelets 150 - 400 K/uL 195  211  206       CMP     Component Value Date/Time   NA 136 10/30/2021 1159   NA 139 04/04/2021 0926   NA 139 10/02/2011 1051   K 4.8 10/30/2021 1159   K 4.2 10/02/2011 1051   CL 106 10/30/2021 1159   CL 106 10/02/2011 1051   CO2 20 (L) 10/30/2021 1159   CO2 27 10/02/2011 1051   GLUCOSE 103 (H) 10/30/2021 1159   GLUCOSE 97 10/02/2011 1051   BUN 19 10/30/2021 1159   BUN 19 04/04/2021 0926   BUN 21 (H) 10/02/2011 1051   CREATININE 1.20 10/30/2021 1159   CREATININE 1.88 (H) 10/02/2011 1051   CALCIUM 8.9 10/30/2021 1159   CALCIUM 8.4 (L) 10/02/2011 1051   PROT 6.8 10/30/2021 0922   PROT 6.6 04/04/2021 0926   PROT 7.7 09/21/2011 1245   ALBUMIN 3.6 10/30/2021 0922   ALBUMIN 4.0 04/04/2021 0926   ALBUMIN 4.0 09/21/2011 1245   AST 33 10/30/2021 0922   AST 25 09/21/2011 1245   ALT 38 10/30/2021 0922   ALT 34 09/21/2011 1245   ALKPHOS 80 10/30/2021 0922   ALKPHOS 97 09/21/2011 1245   BILITOT 1.0 10/30/2021 0922   BILITOT 0.5 04/04/2021 0926   BILITOT 0.6 09/21/2011 1245   GFRNONAA >60 10/30/2021 1159   GFRNONAA 36 (L) 10/02/2011 1051   GFRAA 60 12/04/2019 0810   GFRAA 42 (L) 10/02/2011 1051     No results found.     Assessment & Plan:   1. Stenosis of right carotid artery Recommend:  The patient is s/p  successful right carotid stent placement  Duplex ultrasound preoperatively shows 1-39% contralateral stenosis.  Continue antiplatelet therapy as prescribed Continue management of CAD, HTN and Hyperlipidemia Healthy heart diet,  encouraged exercise at least 4 times per week  Follow up in 3 months with duplex ultrasound and physical exam based on the patient's carotid surgery    Current Outpatient Medications on File Prior to Visit  Medication Sig Dispense Refill   aspirin EC 81 MG tablet Take 1 tablet (81 mg total) by mouth daily. Swallow whole. 30 tablet 12   atorvastatin (LIPITOR) 80 MG tablet TAKE 1 TABLET BY MOUTH EVERY NIGHT AT BEDTIME 90 tablet 3   clopidogrel (PLAVIX) 75 MG tablet Take 75 mg by mouth daily.     co-enzyme Q-10 30 MG capsule Take 30 mg by mouth daily.      diclofenac sodium (VOLTAREN) 1 % GEL Apply 2 g topically 4 (four) times daily. 100 g 3   docusate sodium (COLACE) 100 MG capsule Take 1 capsule (100 mg total) by mouth 2 (two) times daily. 60 capsule 0   Krill Oil 1000 MG CAPS Take 1,000 mg by mouth daily.      metFORMIN (GLUCOPHAGE-XR) 750 MG 24 hr tablet TAKE 1 TABLET BY MOUTH ONCE A DAY WITH BREAKFAST 90 tablet 1   metoprolol tartrate (LOPRESSOR) 25 MG tablet Take 1 tablet (25 mg total) by mouth 2 (two) times daily. 60 tablet 2   MULTIPLE VITAMIN PO Take 1 tablet by mouth daily.     OneTouch Delica Lancets 90Z MISC TEST FASTING GLUCOSE LEVEL EACH MORNING BEFORE BREAKFAST 100 each 4   polyethylene glycol (MIRALAX / GLYCOLAX) 17 g packet Take 17  g by mouth daily. (Patient not taking: Reported on 10/30/2021) 30 packet 0   ramipril (ALTACE) 10 MG capsule Take 1 capsule (10 mg total) by mouth daily. 30 capsule 2   tamsulosin (FLOMAX) 0.4 MG CAPS capsule Take 0.4 mg by mouth daily.     No current facility-administered medications on file prior to visit.    There are no Patient Instructions on file for this visit. No follow-ups on file.   Kris Hartmann, NP

## 2021-11-07 ENCOUNTER — Other Ambulatory Visit: Payer: Self-pay

## 2021-11-07 ENCOUNTER — Ambulatory Visit
Admission: RE | Admit: 2021-11-07 | Discharge: 2021-11-07 | Disposition: A | Discharge status: Home / Self Care | Payer: Medicare Other | Source: Ambulatory Visit | Attending: Radiation Oncology | Admitting: Radiation Oncology

## 2021-11-07 DIAGNOSIS — Z5111 Encounter for antineoplastic chemotherapy: Secondary | ICD-10-CM | POA: Diagnosis not present

## 2021-11-07 DIAGNOSIS — D6481 Anemia due to antineoplastic chemotherapy: Secondary | ICD-10-CM | POA: Diagnosis not present

## 2021-11-07 DIAGNOSIS — F17220 Nicotine dependence, chewing tobacco, uncomplicated: Secondary | ICD-10-CM | POA: Diagnosis not present

## 2021-11-07 DIAGNOSIS — Z87891 Personal history of nicotine dependence: Secondary | ICD-10-CM | POA: Diagnosis not present

## 2021-11-07 DIAGNOSIS — Z510 Encounter for antineoplastic radiation therapy: Secondary | ICD-10-CM | POA: Diagnosis not present

## 2021-11-07 DIAGNOSIS — C3411 Malignant neoplasm of upper lobe, right bronchus or lung: Secondary | ICD-10-CM | POA: Diagnosis not present

## 2021-11-07 DIAGNOSIS — D701 Agranulocytosis secondary to cancer chemotherapy: Secondary | ICD-10-CM | POA: Diagnosis not present

## 2021-11-07 LAB — RAD ONC ARIA SESSION SUMMARY
Course Elapsed Days: 34
Plan Fractions Treated to Date: 22
Plan Prescribed Dose Per Fraction: 2 Gy
Plan Total Fractions Prescribed: 35
Plan Total Prescribed Dose: 70 Gy
Reference Point Dosage Given to Date: 44 Gy
Reference Point Session Dosage Given: 2 Gy
Session Number: 22

## 2021-11-07 MED FILL — Dexamethasone Sodium Phosphate Inj 100 MG/10ML: INTRAMUSCULAR | Qty: 1 | Status: AC

## 2021-11-08 ENCOUNTER — Other Ambulatory Visit: Payer: Self-pay

## 2021-11-08 ENCOUNTER — Inpatient Hospital Stay: Payer: Medicare Other

## 2021-11-08 ENCOUNTER — Inpatient Hospital Stay (HOSPITAL_BASED_OUTPATIENT_CLINIC_OR_DEPARTMENT_OTHER): Payer: Medicare Other | Admitting: Oncology

## 2021-11-08 ENCOUNTER — Ambulatory Visit
Admission: RE | Admit: 2021-11-08 | Discharge: 2021-11-08 | Disposition: A | Discharge status: Home / Self Care | Payer: Medicare Other | Source: Ambulatory Visit | Attending: Radiation Oncology | Admitting: Radiation Oncology

## 2021-11-08 ENCOUNTER — Encounter: Payer: Self-pay | Admitting: Oncology

## 2021-11-08 VITALS — BP 91/59 | HR 82 | Temp 97.0°F | Resp 16 | Wt 164.9 lb

## 2021-11-08 VITALS — BP 120/67 | HR 75

## 2021-11-08 DIAGNOSIS — Z87891 Personal history of nicotine dependence: Secondary | ICD-10-CM | POA: Diagnosis not present

## 2021-11-08 DIAGNOSIS — D6481 Anemia due to antineoplastic chemotherapy: Secondary | ICD-10-CM | POA: Diagnosis not present

## 2021-11-08 DIAGNOSIS — D701 Agranulocytosis secondary to cancer chemotherapy: Secondary | ICD-10-CM | POA: Diagnosis not present

## 2021-11-08 DIAGNOSIS — C3411 Malignant neoplasm of upper lobe, right bronchus or lung: Secondary | ICD-10-CM

## 2021-11-08 DIAGNOSIS — Z510 Encounter for antineoplastic radiation therapy: Secondary | ICD-10-CM | POA: Diagnosis not present

## 2021-11-08 DIAGNOSIS — Z5111 Encounter for antineoplastic chemotherapy: Secondary | ICD-10-CM | POA: Diagnosis not present

## 2021-11-08 DIAGNOSIS — F17220 Nicotine dependence, chewing tobacco, uncomplicated: Secondary | ICD-10-CM | POA: Diagnosis not present

## 2021-11-08 LAB — RAD ONC ARIA SESSION SUMMARY
Course Elapsed Days: 35
Plan Fractions Treated to Date: 23
Plan Prescribed Dose Per Fraction: 2 Gy
Plan Total Fractions Prescribed: 35
Plan Total Prescribed Dose: 70 Gy
Reference Point Dosage Given to Date: 46 Gy
Reference Point Session Dosage Given: 2 Gy
Session Number: 23

## 2021-11-08 LAB — CBC WITH DIFFERENTIAL/PLATELET
Abs Immature Granulocytes: 0.17 10*3/uL — ABNORMAL HIGH (ref 0.00–0.07)
Basophils Absolute: 0 10*3/uL (ref 0.0–0.1)
Basophils Relative: 1 %
Eosinophils Absolute: 0 10*3/uL (ref 0.0–0.5)
Eosinophils Relative: 1 %
HCT: 30.8 % — ABNORMAL LOW (ref 39.0–52.0)
Hemoglobin: 10.4 g/dL — ABNORMAL LOW (ref 13.0–17.0)
Immature Granulocytes: 5 %
Lymphocytes Relative: 22 %
Lymphs Abs: 0.8 10*3/uL (ref 0.7–4.0)
MCH: 28.3 pg (ref 26.0–34.0)
MCHC: 33.8 g/dL (ref 30.0–36.0)
MCV: 83.7 fL (ref 80.0–100.0)
Monocytes Absolute: 0.6 10*3/uL (ref 0.1–1.0)
Monocytes Relative: 18 %
Neutro Abs: 1.9 10*3/uL (ref 1.7–7.7)
Neutrophils Relative %: 53 %
Platelets: 197 10*3/uL (ref 150–400)
RBC: 3.68 MIL/uL — ABNORMAL LOW (ref 4.22–5.81)
RDW: 17.6 % — ABNORMAL HIGH (ref 11.5–15.5)
WBC: 3.5 10*3/uL — ABNORMAL LOW (ref 4.0–10.5)
nRBC: 1.7 % — ABNORMAL HIGH (ref 0.0–0.2)

## 2021-11-08 LAB — COMPREHENSIVE METABOLIC PANEL
ALT: 24 U/L (ref 0–44)
AST: 22 U/L (ref 15–41)
Albumin: 3.5 g/dL (ref 3.5–5.0)
Alkaline Phosphatase: 77 U/L (ref 38–126)
Anion gap: 7 (ref 5–15)
BUN: 19 mg/dL (ref 8–23)
CO2: 24 mmol/L (ref 22–32)
Calcium: 8.7 mg/dL — ABNORMAL LOW (ref 8.9–10.3)
Chloride: 104 mmol/L (ref 98–111)
Creatinine, Ser: 1.08 mg/dL (ref 0.61–1.24)
GFR, Estimated: >60 mL/min (ref >60)
Glucose, Bld: 92 mg/dL (ref 70–99)
Potassium: 3.9 mmol/L (ref 3.5–5.1)
Sodium: 135 mmol/L (ref 135–145)
Total Bilirubin: 0.5 mg/dL (ref 0.3–1.2)
Total Protein: 6.4 g/dL — ABNORMAL LOW (ref 6.5–8.1)

## 2021-11-08 MED ORDER — HEPARIN SOD (PORK) LOCK FLUSH 100 UNIT/ML IV SOLN
500.0000 [IU] | Freq: Once | INTRAVENOUS | Status: AC | PRN
  Administered 2021-11-08: 500 [IU]
  Filled 2021-11-08: qty 5

## 2021-11-08 MED ORDER — DIPHENHYDRAMINE HCL 50 MG/ML IJ SOLN
50.0000 mg | Freq: Once | INTRAMUSCULAR | Status: AC
  Administered 2021-11-08: 50 mg via INTRAVENOUS
  Filled 2021-11-08: qty 1

## 2021-11-08 MED ORDER — SODIUM CHLORIDE 0.9 % IV SOLN
Freq: Once | INTRAVENOUS | Status: AC
  Filled 2021-11-08: qty 250

## 2021-11-08 MED ORDER — SODIUM CHLORIDE 0.9 % IV SOLN
45.0000 mg/m2 | Freq: Once | INTRAVENOUS | Status: AC
  Administered 2021-11-08: 84 mg via INTRAVENOUS
  Filled 2021-11-08: qty 14

## 2021-11-08 MED ORDER — SODIUM CHLORIDE 0.9 % IV SOLN
172.4000 mg | Freq: Once | INTRAVENOUS | Status: AC
  Administered 2021-11-08: 170 mg via INTRAVENOUS
  Filled 2021-11-08: qty 17

## 2021-11-08 MED ORDER — SODIUM CHLORIDE 0.9 % IV SOLN
10.0000 mg | Freq: Once | INTRAVENOUS | Status: AC
  Administered 2021-11-08: 10 mg via INTRAVENOUS
  Filled 2021-11-08: qty 10

## 2021-11-08 MED ORDER — PALONOSETRON HCL INJECTION 0.25 MG/5ML
0.2500 mg | Freq: Once | INTRAVENOUS | Status: AC
  Administered 2021-11-08: 0.25 mg via INTRAVENOUS
  Filled 2021-11-08: qty 5

## 2021-11-08 MED ORDER — FAMOTIDINE IN NACL 20-0.9 MG/50ML-% IV SOLN
20.0000 mg | Freq: Once | INTRAVENOUS | Status: AC
  Administered 2021-11-08: 20 mg via INTRAVENOUS
  Filled 2021-11-08: qty 50

## 2021-11-08 NOTE — Patient Instructions (Signed)
[facility]  Discharge Instructions: Thank you for choosing [facility] to provide your oncology and hematology care.  If you have a lab appointment with the Cancer Center, please go directly to the Cancer Center and check in at the registration area.  Wear comfortable clothing and clothing appropriate for easy access to any Portacath or PICC line.   We strive to give you quality time with your provider. You may need to reschedule your appointment if you arrive late (15 or more minutes).  Arriving late affects you and other patients whose appointments are after yours.  Also, if you miss three or more appointments without notifying the office, you may be dismissed from the clinic at the provider's discretion.      For prescription refill requests, have your pharmacy contact our office and allow 72 hours for refills to be completed.       To help prevent nausea and vomiting after your treatment, we encourage you to take your nausea medication as directed.  BELOW ARE SYMPTOMS THAT SHOULD BE REPORTED IMMEDIATELY: *FEVER GREATER THAN 100.4 F (38 C) OR HIGHER *CHILLS OR SWEATING *NAUSEA AND VOMITING THAT IS NOT CONTROLLED WITH YOUR NAUSEA MEDICATION *UNUSUAL SHORTNESS OF BREATH *UNUSUAL BRUISING OR BLEEDING *URINARY PROBLEMS (pain or burning when urinating, or frequent urination) *BOWEL PROBLEMS (unusual diarrhea, constipation, pain near the anus) TENDERNESS IN MOUTH AND THROAT WITH OR WITHOUT PRESENCE OF ULCERS (sore throat, sores in mouth, or a toothache) UNUSUAL RASH, SWELLING OR PAIN  UNUSUAL VAGINAL DISCHARGE OR ITCHING   Items with * indicate a potential emergency and should be followed up as soon as possible or go to the Emergency Department if any problems should occur.  Please show the CHEMOTHERAPY ALERT CARD or IMMUNOTHERAPY ALERT CARD at check-in to the Emergency Department and triage nurse.  Should you have questions after your  visit or need to cancel or reschedule your appointment, please contact [facility]  [phone] and follow the prompts.  Office hours are 8:00 a.m. to 4:30 p.m. Monday - Friday. Please note that voicemails left after 4:00 p.m. may not be returned until the following business day.  We are closed weekends and major holidays. You have access to a nurse at all times for urgent questions. Please call the main number to the clinic [phone] and follow the prompts.  For any non-urgent questions, you may also contact your provider using MyChart. We now offer e-Visits for anyone 18 and older to request care online for non-urgent symptoms. For details visit [email or web address].   Also download the MyChart app! Go to the app store, search "MyChart", open the app, select [facility], and log in with your MyChart username and password.  Masks are optional in the cancer centers. If you would like for your care team to wear a mask while they are taking care of you, please let them know. For doctor visits, patients may have with them one support person who is at least 77 years old. At this time, visitors are not allowed in the infusion area.   

## 2021-11-09 ENCOUNTER — Inpatient Hospital Stay: Payer: Medicare Other

## 2021-11-09 ENCOUNTER — Ambulatory Visit
Admission: RE | Admit: 2021-11-09 | Discharge: 2021-11-09 | Disposition: A | Discharge status: Home / Self Care | Payer: Medicare Other | Source: Ambulatory Visit | Attending: Radiation Oncology | Admitting: Radiation Oncology

## 2021-11-09 ENCOUNTER — Other Ambulatory Visit: Payer: Self-pay

## 2021-11-09 ENCOUNTER — Encounter: Payer: Self-pay | Admitting: Oncology

## 2021-11-09 DIAGNOSIS — F17220 Nicotine dependence, chewing tobacco, uncomplicated: Secondary | ICD-10-CM | POA: Diagnosis not present

## 2021-11-09 DIAGNOSIS — Z87891 Personal history of nicotine dependence: Secondary | ICD-10-CM | POA: Diagnosis not present

## 2021-11-09 DIAGNOSIS — D6481 Anemia due to antineoplastic chemotherapy: Secondary | ICD-10-CM | POA: Diagnosis not present

## 2021-11-09 DIAGNOSIS — C3411 Malignant neoplasm of upper lobe, right bronchus or lung: Secondary | ICD-10-CM | POA: Diagnosis not present

## 2021-11-09 DIAGNOSIS — Z510 Encounter for antineoplastic radiation therapy: Secondary | ICD-10-CM | POA: Diagnosis not present

## 2021-11-09 DIAGNOSIS — D701 Agranulocytosis secondary to cancer chemotherapy: Secondary | ICD-10-CM | POA: Diagnosis not present

## 2021-11-09 DIAGNOSIS — Z5111 Encounter for antineoplastic chemotherapy: Secondary | ICD-10-CM | POA: Diagnosis not present

## 2021-11-09 LAB — RAD ONC ARIA SESSION SUMMARY
Course Elapsed Days: 36
Plan Fractions Treated to Date: 24
Plan Prescribed Dose Per Fraction: 2 Gy
Plan Total Fractions Prescribed: 35
Plan Total Prescribed Dose: 70 Gy
Reference Point Dosage Given to Date: 48 Gy
Reference Point Session Dosage Given: 2 Gy
Session Number: 24

## 2021-11-09 NOTE — Progress Notes (Signed)
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  He quit smoking about 30 years ago but still chews tobacco.  He presented to the ER with symptoms of neck pain which led to an MRI cervical spine as well as CT angio chest.  MRI cervical spine showed degenerative changes along with moderate spinal stenosis and mass effect between C3-C4 and C4-C5.  Foraminal stenosis at C3-C4 C5-C6-C7 nerve levels.  Nonspecific 10 mm T3 vertebral body lesion indeterminate for bone metastases.  CT angio chest showed a 4.5 x 4.9 x 3.7 cm right upper lobe peripheral mass with probable pleural involvement and potential early invasion into right middle lobe.  Borderline enlarged right hilar lymph nodes measuring up to 1.2 cm.   PET CT scan showed hypermetabolic 4.8 cm right upper lobe mass consistent with primary bronchogenic carcinoma and mild asymmetric FDG activity in the right suprahilar region suspicious for right hilar lymph node metastases.  Patient had a CT-guided right upper lobe lung biopsy which was consistent with squamous cell carcinoma.  Interval history-reports that he still has retrosternal chest pain but much improved as compared to his prior visit.  He is using oxycodone once a  day and is also taking Protonix 40 mg daily  ECOG PS- 2 Pain scale- 3  Review of systems- Review of Systems  Constitutional:  Positive for malaise/fatigue. Negative for chills, fever and weight loss.  HENT:  Negative for congestion, ear discharge and nosebleeds.   Eyes:  Negative for blurred vision.  Respiratory:  Negative for cough, hemoptysis, sputum production, shortness of breath and wheezing.   Cardiovascular:  Negative for chest pain, palpitations, orthopnea and claudication.  Gastrointestinal:  Negative for abdominal pain, blood in stool, constipation, diarrhea, heartburn, melena, nausea and vomiting.  Genitourinary:  Negative for dysuria, flank pain, frequency, hematuria and urgency.  Musculoskeletal:  Negative for back pain, joint pain and myalgias.  Skin:  Negative for rash.  Neurological:  Negative for dizziness, tingling, focal weakness, seizures, weakness and headaches.  Endo/Heme/Allergies:  Does not bruise/bleed easily.  Psychiatric/Behavioral:  Negative for depression and suicidal ideas. The patient does not have insomnia.       Allergies  Allergen Reactions   Codeine Nausea Only, Nausea And Vomiting and Other (See Comments)    Other reaction(s): Vomiting     Past Medical History:  Diagnosis Date   Arthritis    OSTEOARTHRITIS   Chronic kidney disease    kidney stones   COPD (chronic obstructive pulmonary disease) (HCC)    Coronary artery disease    2 stents    Diabetes mellitus without complication (HCC)    GERD (gastroesophageal reflux disease)    Hypercholesteremia    Hypertension    Lung cancer ([place])    Myocardial infarction [facility]) 6629   Renal colic      Past  Surgical History:  Procedure Laterality Date   CARDIAC CATHETERIZATION  2013   2 stents   CAROTID PTA/STENT INTERVENTION Right 09/11/2021   Procedure: CAROTID PTA/STENT INTERVENTION;  Surgeon: Algernon Huxley, MD;  Location: [facility];  Service: Cardiovascular;  Laterality: Right;    CATARACT EXTRACTION, BILATERAL     COLONOSCOPY  04/27/05   COLONOSCOPY WITH PROPOFOL N/A 06/08/2015   Procedure: COLONOSCOPY WITH PROPOFOL;  Surgeon: Robert Bellow, MD;  Location: [facility];  Service: Endoscopy;  Laterality: N/A;   CYSTOSCOPY WITH INSERTION OF UROLIFT N/A 02/11/2020   Procedure: CYSTOSCOPY WITH INSERTION OF UROLIFT;  Surgeon: Royston Cowper, MD;  Location: [facility];  Service: Urology;  Laterality: N/A;   CYSTOSCOPY WITH STENT PLACEMENT Right 09/16/2016   Procedure: CYSTOSCOPY WITH STENT PLACEMENT;  Surgeon: Royston Cowper, MD;  Location: [facility];  Service: Urology;  Laterality: Right;   EXTRACORPOREAL SHOCK WAVE LITHOTRIPSY Left 11/04/2014   has had 2 previous lithotripsies   EXTRACORPOREAL SHOCK WAVE LITHOTRIPSY Left 03/24/2015   Procedure: EXTRACORPOREAL SHOCK WAVE LITHOTRIPSY (ESWL);  Surgeon: Royston Cowper, MD;  Location: [facility];  Service: Urology;  Laterality: Left;   EXTRACORPOREAL SHOCK WAVE LITHOTRIPSY Right 10/04/2016   Procedure: EXTRACORPOREAL SHOCK WAVE LITHOTRIPSY (ESWL);  Surgeon: Royston Cowper, MD;  Location: [facility];  Service: Urology;  Laterality: Right;   LEFT HEART CATH AND CORONARY ANGIOGRAPHY N/A 12/05/2018   Procedure: LEFT HEART CATH AND CORONARY ANGIOGRAPHY with possible pci and stent;  Surgeon: Yolonda Kida, MD;  Location: [facility];  Service: Cardiovascular;  Laterality: N/A;   PORTA CATH INSERTION Right 09/25/2021   Procedure: PORTA CATH INSERTION;  Surgeon: Algernon Huxley, MD;  Location: [facility];  Service: Cardiovascular;  Laterality: Right;    Social History   Socioeconomic History   Marital status: Married    Spouse name: Not on file   Number of children: 2   Years of education: Not on file   Highest education level: 8th grade  Occupational History   Occupation: retired  Tobacco Use   Smoking status: Former    Packs/day: 1.00    Years: 30.00    Total pack years: 30.00    Types: Cigarettes    Quit  date: 03/05/1984    Years since quitting: 37.7   Smokeless tobacco: Current    Types: Chew   Tobacco comments:    Chews tobacco daily.  Vaping Use   Vaping Use: Never used  Substance and Sexual Activity   Alcohol use: No    Alcohol/week: 0.0 standard drinks of alcohol   Drug use: No   Sexual activity: Not Currently  Other Topics Concern   Not on file  Social History Narrative   Lives with Zigmund Daniel, wife, and son [place].   Social Determinants of Health   Financial Resource Strain: Low Risk  (04/07/2020)   Overall Financial Resource Strain (CARDIA)    Difficulty of Paying Living Expenses: Not hard at all  Food Insecurity: No Food Insecurity (04/07/2020)   Hunger Vital Sign    Worried About Running Out of Food in the Last Year: Never true    Ran Out of Food in the Last Year: Never true  Transportation Needs: No Transportation Needs (04/07/2020)   PRAPARE - Hydrologist (Medical): No    Lack of Transportation (Non-Medical): No  Physical Activity: Inactive (04/07/2020)   Exercise Vital Sign    Days of Exercise per Week:  0 days    Minutes of Exercise per Session: 0 min  Stress: No Stress Concern Present (04/07/2020)   [place]    Feeling of Stress : Not at all  Social Connections: Moderately Integrated (04/07/2020)   Social Connection and Isolation Panel [NHANES]    Frequency of Communication with Friends and Family: More than three times a week    Frequency of Social Gatherings with Friends and Family: More than three times a week    Attends Religious Services: More than 4 times per year    Active Member of [employer] or Organizations: No    Attends Archivist Meetings: Never    Marital Status: Married  Human resources officer Violence: Not At Risk (04/07/2020)   Humiliation, Afraid, Rape, and Kick questionnaire    Fear of Current or Ex-Partner: No    Emotionally Abused: No    Physically Abused: No     Sexually Abused: No    Family History  Problem Relation Age of Onset   Pulmonary embolism Mother    Transient ischemic attack Mother    Diabetes Mother    Pancreatic cancer Father    Hypertension Father    Diabetes Father    Cirrhosis Brother      Current Outpatient Medications:    amLODipine (NORVASC) 5 MG tablet, Take 1 tablet (5 mg total) by mouth daily., Disp: 90 tablet, Rfl: 3   aspirin EC 81 MG tablet, Take 1 tablet (81 mg total) by mouth daily. Swallow whole., Disp: 30 tablet, Rfl: 12   atorvastatin (LIPITOR) 80 MG tablet, TAKE 1 TABLET BY MOUTH EVERY NIGHT AT BEDTIME, Disp: 90 tablet, Rfl: 3   clopidogrel (PLAVIX) 75 MG tablet, Take 75 mg by mouth daily., Disp: , Rfl:    co-enzyme Q-10 30 MG capsule, Take 30 mg by mouth daily. , Disp: , Rfl:    diclofenac sodium (VOLTAREN) 1 % GEL, Apply 2 g topically 4 (four) times daily., Disp: 100 g, Rfl: 3   docusate sodium (COLACE) 100 MG capsule, Take 1 capsule (100 mg total) by mouth 2 (two) times daily., Disp: 60 capsule, Rfl: 0   Krill Oil 1000 MG CAPS, Take 1,000 mg by mouth daily. , Disp: , Rfl:    metFORMIN (GLUCOPHAGE-XR) 750 MG 24 hr tablet, TAKE 1 TABLET BY MOUTH ONCE A DAY WITH BREAKFAST, Disp: 90 tablet, Rfl: 1   metoprolol tartrate (LOPRESSOR) 25 MG tablet, Take 1 tablet (25 mg total) by mouth 2 (two) times daily., Disp: 60 tablet, Rfl: 2   MULTIPLE VITAMIN PO, Take 1 tablet by mouth daily., Disp: , Rfl:    OneTouch Delica Lancets 32D MISC, TEST FASTING GLUCOSE LEVEL EACH MORNING BEFORE BREAKFAST, Disp: 100 each, Rfl: 4   polyethylene glycol (MIRALAX / GLYCOLAX) 17 g packet, Take 17 g by mouth daily., Disp: 30 packet, Rfl: 0   potassium chloride SA (KLOR-CON M) 20 MEQ tablet, Take 1 tablet (20 mEq total) by mouth daily., Disp: 7 tablet, Rfl: 0   Pumpkin Seed-Soy Germ (AZO BLADDER CONTROL/GO-LESS PO), Take by mouth., Disp: , Rfl:    sucralfate (CARAFATE) 1 g tablet, Take 1 tablet (1 g total) by mouth 4 (four) times daily as  needed (reflux)., Disp: 60 tablet, Rfl: 0   tamsulosin (FLOMAX) 0.4 MG CAPS capsule, Take 0.4 mg by mouth daily., Disp: , Rfl:    oxyCODONE (OXY IR/ROXICODONE) 5 MG immediate release tablet, Take 1 tablet (5 mg total) by mouth every  6 (six) hours as needed for severe pain. (Patient not taking: Reported on 11/08/2021), Disp: 30 tablet, Rfl: 0   pantoprazole (PROTONIX) 20 MG tablet, Take 2 tablets (40 mg total) by mouth daily. (Patient not taking: Reported on 11/08/2021), Disp: 30 tablet, Rfl: 2   ramipril (ALTACE) 10 MG capsule, Take 1 capsule (10 mg total) by mouth daily. (Patient not taking: Reported on 11/08/2021), Disp: 30 capsule, Rfl: 2  Physical exam:  Vitals:   11/08/21 1311  BP: (!) 91/59  Pulse: 82  Resp: 16  Temp: (!) 97 F (36.1 C)  SpO2: 100%  Weight: 164 lb 14.4 oz (74.8 kg)   Physical Exam Constitutional:      General: He is not in acute distress. Cardiovascular:     Rate and Rhythm: Normal rate and regular rhythm.     Heart sounds: Normal heart sounds.  Pulmonary:     Effort: Pulmonary effort is normal.     Breath sounds: Normal breath sounds.  Abdominal:     General: Bowel sounds are normal.     Palpations: Abdomen is soft.  Skin:    General: Skin is warm and dry.  Neurological:     Mental Status: He is alert and oriented to person, place, and time.         Latest Ref Rng & Units 11/08/2021   12:48 PM  CMP  Glucose 70 - 99 mg/dL 92   BUN 8 - 23 mg/dL 19   Creatinine 0.61 - 1.24 mg/dL 1.08   Sodium 135 - 145 mmol/L 135   Potassium 3.5 - 5.1 mmol/L 3.9   Chloride 98 - 111 mmol/L 104   CO2 22 - 32 mmol/L 24   Calcium 8.9 - 10.3 mg/dL 8.7   Total Protein 6.5 - 8.1 g/dL 6.4   Total Bilirubin 0.3 - 1.2 mg/dL 0.5   Alkaline Phos 38 - 126 U/L 77   AST 15 - 41 U/L 22   ALT 0 - 44 U/L 24       Latest Ref Rng & Units 11/08/2021   12:48 PM  CBC  WBC 4.0 - 10.5 K/uL 3.5   Hemoglobin 13.0 - 17.0 g/dL 10.4   Hematocrit 39.0 - 52.0 % 30.8   Platelets 150 - 400 K/uL  197     No images are attached to the encounter.  DG Chest 2 View  Result Date: 10/30/2021 CLINICAL DATA:  Chest pain on and off for several days EXAM: CHEST - 2 VIEW COMPARISON:  10/15/2021 FINDINGS: Unchanged right upper lobe opacity, a mass by CT. Porta catheter on the right with tip at the upper cavoatrial junction. Mild interstitial coarsening. There is no edema, consolidation, effusion, or pneumothorax. Normal heart size and mediastinal contours. Coronary stenting. IMPRESSION: No active cardiopulmonary disease. Electronically Signed   By: Jorje Guild M.D.   On: 10/30/2021 12:11   VAS US CAROTID  Result Date: 10/25/2021 Carotid Arterial Duplex Study Patient Name:  SHEM PLEMMONS  Date of Exam:   10/18/2021 Medical Rec #: [id number]           Accession #:    [id number] Date of Birth: 1945/04/07          Patient Gender: M Patient Age:   66 years Exam Location:  [facility] Procedure:      VAS US CAROTID Referring Phys: Leotis Pain --------------------------------------------------------------------------------  Indications:  Carotid artery disease and left endarterectomy. Risk Factors: Hypertension, hyperlipidemia, Diabetes, past history of smoking,  coronary artery disease. Performing Technologist: Delorise Shiner RVT  Examination Guidelines: A complete evaluation includes B-mode imaging, spectral Doppler, color Doppler, and power Doppler as needed of all accessible portions of each vessel. Bilateral testing is considered an integral part of a complete examination. Limited examinations for reoccurring indications may be performed as noted.  Right Carotid Findings: +----------+--------+--------+--------+------------------+--------+           PSV cm/sEDV cm/sStenosisPlaque DescriptionComments +----------+--------+--------+--------+------------------+--------+ CCA Prox  70      0                                           +----------+--------+--------+--------+------------------+--------+ CCA Mid   77      13                                         +----------+--------+--------+--------+------------------+--------+ CCA Distal79      15                                stent    +----------+--------+--------+--------+------------------+--------+ ICA Prox  70      17                                stent    +----------+--------+--------+--------+------------------+--------+ ICA Mid   73      23                                         +----------+--------+--------+--------+------------------+--------+ ICA Distal96      26                                         +----------+--------+--------+--------+------------------+--------+ ECA       167     13                                         +----------+--------+--------+--------+------------------+--------+ +----------+--------+-------+----------------+-------------------+           PSV cm/sEDV cmsDescribe        Arm Pressure (mmHG) +----------+--------+-------+----------------+-------------------+ Subclavian112     0      Multiphasic, WNL                    +----------+--------+-------+----------------+-------------------+ +---------+--------+--+--------+-+---------+ VertebralPSV cm/s47EDV cm/s8Antegrade +---------+--------+--+--------+-+---------+  Right Stent(s): +----------------+--------+--------+--------+--------+--------+ Distal CCA - ICAPSV cm/sEDV cm/sStenosisWaveformComments +----------------+--------+--------+--------+--------+--------+ Prox to Stent   84      17                               +----------------+--------+--------+--------+--------+--------+ Proximal Stent  68      13                               +----------------+--------+--------+--------+--------+--------+ Mid Stent       62      10                               +----------------+--------+--------+--------+--------+--------+  Distal Stent    58      14                               +----------------+--------+--------+--------+--------+--------+ Distal to Stent 72      23                               +----------------+--------+--------+--------+--------+--------+   Left Carotid Findings: +----------+--------+--------+--------+------------------+--------+           PSV cm/sEDV cm/sStenosisPlaque DescriptionComments +----------+--------+--------+--------+------------------+--------+ CCA Prox  103     16                                         +----------+--------+--------+--------+------------------+--------+ CCA Mid   104     16                                         +----------+--------+--------+--------+------------------+--------+ CCA Distal89      19                                         +----------+--------+--------+--------+------------------+--------+ ICA Prox  75      26      1-39%   smooth                     +----------+--------+--------+--------+------------------+--------+ ICA Mid   88      26                                         +----------+--------+--------+--------+------------------+--------+ ICA Distal75      25                                         +----------+--------+--------+--------+------------------+--------+ ECA       138     0                                          +----------+--------+--------+--------+------------------+--------+ +----------+--------+--------+----------------+-------------------+           PSV cm/sEDV cm/sDescribe        Arm Pressure (mmHG) +----------+--------+--------+----------------+-------------------+ Subclavian122     0       Multiphasic, WNL                    +----------+--------+--------+----------------+-------------------+ +---------+--------+--+--------+--+---------+ VertebralPSV cm/s72EDV cm/s18Antegrade +---------+--------+--+--------+--+---------+   Summary: Right Carotid: There is  no evidence of stenosis in the right ICA. Patent ICA                stent. Left Carotid: Velocities in the left ICA are consistent with a 1-39% stenosis. Vertebrals:  Bilateral vertebral arteries demonstrate antegrade flow. Subclavians: Normal flow hemodynamics were seen in bilateral subclavian              arteries. *See table(s) above for measurements and observations.  Electronically signed by Leotis Pain MD on 10/25/2021 at 8:16:58 AM.    Final    CT Angio Chest PE W and/or Wo Contrast  Result Date: 10/15/2021 CLINICAL DATA:  High probability pulmonary embolism, left flank pain EXAM: CT ANGIOGRAPHY CHEST CT ABDOMEN AND PELVIS WITH CONTRAST TECHNIQUE: Multidetector CT imaging of the chest was performed using the standard protocol during bolus administration of intravenous contrast. Multiplanar CT image reconstructions and MIPs were obtained to evaluate the vascular anatomy. Multidetector CT imaging of the abdomen and pelvis was performed using the standard protocol during bolus administration of intravenous contrast. RADIATION DOSE REDUCTION: This exam was performed according to the departmental dose-optimization program which includes automated exposure control, adjustment of the mA and/or kV according to patient size and/or use of iterative reconstruction technique. CONTRAST:  140mL OMNIPAQUE IOHEXOL 350 MG/ML SOLN COMPARISON:  PET/CT 08/29/2021 and CT abdomen and pelvis 06/19/2021 FINDINGS: CTA CHEST FINDINGS Cardiovascular: Satisfactory opacification of the pulmonary arteries to the segmental level. No evidence of pulmonary embolism. Cardiomegaly. No pericardial effusion. Coronary stenting and calcification. Aortic atherosclerotic calcification. Mediastinum/Nodes: Right hilar and subcarinal adenopathy similar in size to 08/29/2021 where they demonstrated increased FDG avidity. Normal thyroid. Unremarkable esophagus. The central airways are patent. Lungs/Pleura: Irregular right upper lobe mass now measures  4.3 x 4.4 cm, not significantly changed from 08/29/2021. Scarring/atelectasis in the lung bases. Emphysema. No pleural effusion or pneumothorax. Musculoskeletal: No chest wall abnormality. No acute or significant osseous findings. Review of the MIP images confirms the above findings. CT ABDOMEN and PELVIS FINDINGS Hepatobiliary: No suspicious focal liver abnormality is seen. No gallstones, gallbladder wall thickening, or biliary dilatation. Pancreas: Unremarkable. No pancreatic ductal dilatation or surrounding inflammatory changes. Spleen: Normal in size without focal abnormality. Adrenals/Urinary Tract: Adrenal glands are unremarkable. Kidneys are normal, without renal calculi, suspicious focal lesion, or hydronephrosis. Unchanged bilateral renal cysts not requiring follow-up. Bladder is unremarkable. Stomach/Bowel: Stomach and small bowel within normal limits. Colonic diverticulosis greatest within the descending colon. Wall thickening and adjacent inflammatory stranding about the descending colon compatible with diverticulitis. No drainable fluid collection. No free intraperitoneal air. Moderate colonic stool load. Normal appendix. Vascular/Lymphatic: Aortic atherosclerosis. No enlarged abdominal or pelvic lymph nodes. Reproductive: Enlarged prostate containing metal clips. Other: Small fat containing umbilical hernia. Musculoskeletal: No acute or significant osseous findings. Review of the MIP images confirms the above findings. IMPRESSION: Chest: 1. Negative for acute pulmonary embolism. 2. Unchanged size of the 4.8 cm right upper lobe mass consistent with primary bronchogenic carcinoma. 3. Similar right hilar and subcarinal lymphadenopathy suspicious for metastases. 4. Aortic Atherosclerosis (ICD10-I70.0) and Emphysema (ICD10-J43.9). Abdomen/pelvis: 1. Acute uncomplicated diverticulitis of the descending colon. Electronically Signed   By: Placido Sou M.D.   On: 10/15/2021 19:33   CT ABDOMEN PELVIS W  CONTRAST  Result Date: 10/15/2021 CLINICAL DATA:  High probability pulmonary embolism, left flank pain EXAM: CT ANGIOGRAPHY CHEST CT ABDOMEN AND PELVIS WITH CONTRAST TECHNIQUE: Multidetector CT imaging of the chest was performed using the standard protocol during bolus administration of intravenous contrast. Multiplanar CT image reconstructions and MIPs were obtained to evaluate the vascular anatomy. Multidetector CT imaging of the abdomen and pelvis was performed using the standard protocol during bolus administration of intravenous contrast. RADIATION DOSE REDUCTION: This exam was performed according to the departmental dose-optimization program which includes automated exposure control, adjustment of the mA and/or kV according to patient size and/or use of iterative reconstruction technique. CONTRAST:  157mL OMNIPAQUE IOHEXOL 350 MG/ML SOLN COMPARISON:  PET/CT 08/29/2021 and  CT abdomen and pelvis 06/19/2021 FINDINGS: CTA CHEST FINDINGS Cardiovascular: Satisfactory opacification of the pulmonary arteries to the segmental level. No evidence of pulmonary embolism. Cardiomegaly. No pericardial effusion. Coronary stenting and calcification. Aortic atherosclerotic calcification. Mediastinum/Nodes: Right hilar and subcarinal adenopathy similar in size to 08/29/2021 where they demonstrated increased FDG avidity. Normal thyroid. Unremarkable esophagus. The central airways are patent. Lungs/Pleura: Irregular right upper lobe mass now measures 4.3 x 4.4 cm, not significantly changed from 08/29/2021. Scarring/atelectasis in the lung bases. Emphysema. No pleural effusion or pneumothorax. Musculoskeletal: No chest wall abnormality. No acute or significant osseous findings. Review of the MIP images confirms the above findings. CT ABDOMEN and PELVIS FINDINGS Hepatobiliary: No suspicious focal liver abnormality is seen. No gallstones, gallbladder wall thickening, or biliary dilatation. Pancreas: Unremarkable. No pancreatic  ductal dilatation or surrounding inflammatory changes. Spleen: Normal in size without focal abnormality. Adrenals/Urinary Tract: Adrenal glands are unremarkable. Kidneys are normal, without renal calculi, suspicious focal lesion, or hydronephrosis. Unchanged bilateral renal cysts not requiring follow-up. Bladder is unremarkable. Stomach/Bowel: Stomach and small bowel within normal limits. Colonic diverticulosis greatest within the descending colon. Wall thickening and adjacent inflammatory stranding about the descending colon compatible with diverticulitis. No drainable fluid collection. No free intraperitoneal air. Moderate colonic stool load. Normal appendix. Vascular/Lymphatic: Aortic atherosclerosis. No enlarged abdominal or pelvic lymph nodes. Reproductive: Enlarged prostate containing metal clips. Other: Small fat containing umbilical hernia. Musculoskeletal: No acute or significant osseous findings. Review of the MIP images confirms the above findings. IMPRESSION: Chest: 1. Negative for acute pulmonary embolism. 2. Unchanged size of the 4.8 cm right upper lobe mass consistent with primary bronchogenic carcinoma. 3. Similar right hilar and subcarinal lymphadenopathy suspicious for metastases. 4. Aortic Atherosclerosis (ICD10-I70.0) and Emphysema (ICD10-J43.9). Abdomen/pelvis: 1. Acute uncomplicated diverticulitis of the descending colon. Electronically Signed   By: Placido Sou M.D.   On: 10/15/2021 19:33     Assessment and plan- Patient is a 76 y.o. 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  He is here for on treatment assessment prior to cycle 5 of weekly CarboTaxol chemotherapy  Counts okay to proceed with cycle 5 of CarboTaxol chemotherapy today.  He will directly proceed for cycle 6 next week and I will see him back in 2 weeks for cycle 7 which would be his last cycle..  I am holding off on Neupogen with this treatment but he will require 3 doses with next  treatment.  Retrosternal chest pain: Continue Protonix 40 mg daily along with as needed oxycodone   Visit Diagnosis 1. Malignant neoplasm of upper lobe of right lung ([place])   2. Encounter for antineoplastic chemotherapy      Dr. Randa Evens, MD, MPH [facility] [phone] 11/09/2021 11:40 AM

## 2021-11-10 ENCOUNTER — Other Ambulatory Visit: Payer: Self-pay

## 2021-11-10 ENCOUNTER — Ambulatory Visit
Admission: RE | Admit: 2021-11-10 | Discharge: 2021-11-10 | Disposition: A | Discharge status: Home / Self Care | Payer: Medicare Other | Source: Ambulatory Visit | Attending: Radiation Oncology | Admitting: Radiation Oncology

## 2021-11-10 DIAGNOSIS — Z510 Encounter for antineoplastic radiation therapy: Secondary | ICD-10-CM | POA: Diagnosis not present

## 2021-11-10 DIAGNOSIS — C3411 Malignant neoplasm of upper lobe, right bronchus or lung: Secondary | ICD-10-CM | POA: Diagnosis not present

## 2021-11-10 DIAGNOSIS — Z87891 Personal history of nicotine dependence: Secondary | ICD-10-CM | POA: Diagnosis not present

## 2021-11-10 DIAGNOSIS — F17220 Nicotine dependence, chewing tobacco, uncomplicated: Secondary | ICD-10-CM | POA: Diagnosis not present

## 2021-11-10 DIAGNOSIS — Z5111 Encounter for antineoplastic chemotherapy: Secondary | ICD-10-CM | POA: Diagnosis not present

## 2021-11-10 DIAGNOSIS — D701 Agranulocytosis secondary to cancer chemotherapy: Secondary | ICD-10-CM | POA: Diagnosis not present

## 2021-11-10 DIAGNOSIS — D6481 Anemia due to antineoplastic chemotherapy: Secondary | ICD-10-CM | POA: Diagnosis not present

## 2021-11-10 LAB — RAD ONC ARIA SESSION SUMMARY
Course Elapsed Days: 37
Plan Fractions Treated to Date: 25
Plan Prescribed Dose Per Fraction: 2 Gy
Plan Total Fractions Prescribed: 35
Plan Total Prescribed Dose: 70 Gy
Reference Point Dosage Given to Date: 50 Gy
Reference Point Session Dosage Given: 2 Gy
Session Number: 25

## 2021-11-13 ENCOUNTER — Ambulatory Visit
Admission: RE | Admit: 2021-11-13 | Discharge: 2021-11-13 | Disposition: A | Discharge status: Home / Self Care | Payer: Medicare Other | Source: Ambulatory Visit | Attending: Radiation Oncology | Admitting: Radiation Oncology

## 2021-11-13 ENCOUNTER — Other Ambulatory Visit: Payer: Self-pay

## 2021-11-13 DIAGNOSIS — C3411 Malignant neoplasm of upper lobe, right bronchus or lung: Secondary | ICD-10-CM | POA: Diagnosis not present

## 2021-11-13 DIAGNOSIS — Z510 Encounter for antineoplastic radiation therapy: Secondary | ICD-10-CM | POA: Diagnosis not present

## 2021-11-13 DIAGNOSIS — F17220 Nicotine dependence, chewing tobacco, uncomplicated: Secondary | ICD-10-CM | POA: Diagnosis not present

## 2021-11-13 DIAGNOSIS — D701 Agranulocytosis secondary to cancer chemotherapy: Secondary | ICD-10-CM | POA: Diagnosis not present

## 2021-11-13 DIAGNOSIS — Z5111 Encounter for antineoplastic chemotherapy: Secondary | ICD-10-CM | POA: Diagnosis not present

## 2021-11-13 DIAGNOSIS — D6481 Anemia due to antineoplastic chemotherapy: Secondary | ICD-10-CM | POA: Diagnosis not present

## 2021-11-13 DIAGNOSIS — Z87891 Personal history of nicotine dependence: Secondary | ICD-10-CM | POA: Diagnosis not present

## 2021-11-13 LAB — RAD ONC ARIA SESSION SUMMARY
Course Elapsed Days: 40
Plan Fractions Treated to Date: 26
Plan Prescribed Dose Per Fraction: 2 Gy
Plan Total Fractions Prescribed: 35
Plan Total Prescribed Dose: 70 Gy
Reference Point Dosage Given to Date: 52 Gy
Reference Point Session Dosage Given: 2 Gy
Session Number: 26

## 2021-11-14 ENCOUNTER — Ambulatory Visit
Admission: RE | Admit: 2021-11-14 | Discharge: 2021-11-14 | Disposition: A | Discharge status: Home / Self Care | Payer: Medicare Other | Source: Ambulatory Visit | Attending: Radiation Oncology | Admitting: Radiation Oncology

## 2021-11-14 ENCOUNTER — Ambulatory Visit: Payer: Medicare Other | Admitting: Oncology

## 2021-11-14 ENCOUNTER — Ambulatory Visit: Payer: Medicare Other

## 2021-11-14 ENCOUNTER — Other Ambulatory Visit: Payer: Self-pay

## 2021-11-14 ENCOUNTER — Other Ambulatory Visit: Payer: Medicare Other

## 2021-11-14 DIAGNOSIS — D701 Agranulocytosis secondary to cancer chemotherapy: Secondary | ICD-10-CM | POA: Diagnosis not present

## 2021-11-14 DIAGNOSIS — Z510 Encounter for antineoplastic radiation therapy: Secondary | ICD-10-CM | POA: Diagnosis not present

## 2021-11-14 DIAGNOSIS — Z87891 Personal history of nicotine dependence: Secondary | ICD-10-CM | POA: Diagnosis not present

## 2021-11-14 DIAGNOSIS — C3411 Malignant neoplasm of upper lobe, right bronchus or lung: Secondary | ICD-10-CM | POA: Diagnosis not present

## 2021-11-14 DIAGNOSIS — F17220 Nicotine dependence, chewing tobacco, uncomplicated: Secondary | ICD-10-CM | POA: Diagnosis not present

## 2021-11-14 DIAGNOSIS — D6481 Anemia due to antineoplastic chemotherapy: Secondary | ICD-10-CM | POA: Diagnosis not present

## 2021-11-14 DIAGNOSIS — Z5111 Encounter for antineoplastic chemotherapy: Secondary | ICD-10-CM | POA: Diagnosis not present

## 2021-11-14 LAB — RAD ONC ARIA SESSION SUMMARY
Course Elapsed Days: 41
Plan Fractions Treated to Date: 27
Plan Prescribed Dose Per Fraction: 2 Gy
Plan Total Fractions Prescribed: 35
Plan Total Prescribed Dose: 70 Gy
Reference Point Dosage Given to Date: 54 Gy
Reference Point Session Dosage Given: 2 Gy
Session Number: 27

## 2021-11-14 MED FILL — Dexamethasone Sodium Phosphate Inj 100 MG/10ML: INTRAMUSCULAR | Qty: 1 | Status: AC

## 2021-11-15 ENCOUNTER — Ambulatory Visit
Admission: RE | Admit: 2021-11-15 | Discharge: 2021-11-15 | Disposition: A | Discharge status: Home / Self Care | Payer: Medicare Other | Source: Ambulatory Visit | Attending: Radiation Oncology | Admitting: Radiation Oncology

## 2021-11-15 ENCOUNTER — Ambulatory Visit: Payer: Medicare Other

## 2021-11-15 ENCOUNTER — Ambulatory Visit: Payer: Medicare Other | Admitting: Oncology

## 2021-11-15 ENCOUNTER — Inpatient Hospital Stay (HOSPITAL_BASED_OUTPATIENT_CLINIC_OR_DEPARTMENT_OTHER): Payer: Medicare Other | Admitting: Hospice and Palliative Medicine

## 2021-11-15 ENCOUNTER — Inpatient Hospital Stay: Payer: Medicare Other

## 2021-11-15 ENCOUNTER — Other Ambulatory Visit: Payer: Medicare Other

## 2021-11-15 ENCOUNTER — Other Ambulatory Visit: Payer: Self-pay

## 2021-11-15 VITALS — BP 114/55 | HR 73 | Temp 96.0°F | Resp 20 | Wt 161.5 lb

## 2021-11-15 DIAGNOSIS — R531 Weakness: Secondary | ICD-10-CM

## 2021-11-15 DIAGNOSIS — Z87891 Personal history of nicotine dependence: Secondary | ICD-10-CM | POA: Diagnosis not present

## 2021-11-15 DIAGNOSIS — C3411 Malignant neoplasm of upper lobe, right bronchus or lung: Secondary | ICD-10-CM

## 2021-11-15 DIAGNOSIS — F17220 Nicotine dependence, chewing tobacco, uncomplicated: Secondary | ICD-10-CM | POA: Diagnosis not present

## 2021-11-15 DIAGNOSIS — E860 Dehydration: Secondary | ICD-10-CM

## 2021-11-15 DIAGNOSIS — Z510 Encounter for antineoplastic radiation therapy: Secondary | ICD-10-CM | POA: Diagnosis not present

## 2021-11-15 DIAGNOSIS — D6481 Anemia due to antineoplastic chemotherapy: Secondary | ICD-10-CM | POA: Diagnosis not present

## 2021-11-15 DIAGNOSIS — Z5111 Encounter for antineoplastic chemotherapy: Secondary | ICD-10-CM | POA: Diagnosis not present

## 2021-11-15 DIAGNOSIS — D701 Agranulocytosis secondary to cancer chemotherapy: Secondary | ICD-10-CM | POA: Diagnosis not present

## 2021-11-15 LAB — COMPREHENSIVE METABOLIC PANEL
ALT: 22 U/L (ref 0–44)
AST: 22 U/L (ref 15–41)
Albumin: 3.4 g/dL — ABNORMAL LOW (ref 3.5–5.0)
Alkaline Phosphatase: 66 U/L (ref 38–126)
Anion gap: 5 (ref 5–15)
BUN: 18 mg/dL (ref 8–23)
CO2: 22 mmol/L (ref 22–32)
Calcium: 8.5 mg/dL — ABNORMAL LOW (ref 8.9–10.3)
Chloride: 105 mmol/L (ref 98–111)
Creatinine, Ser: 1.18 mg/dL (ref 0.61–1.24)
GFR, Estimated: >60 mL/min (ref >60)
Glucose, Bld: 134 mg/dL — ABNORMAL HIGH (ref 70–99)
Potassium: 4 mmol/L (ref 3.5–5.1)
Sodium: 132 mmol/L — ABNORMAL LOW (ref 135–145)
Total Bilirubin: 0.7 mg/dL (ref 0.3–1.2)
Total Protein: 6.2 g/dL — ABNORMAL LOW (ref 6.5–8.1)

## 2021-11-15 LAB — CBC WITH DIFFERENTIAL/PLATELET
Abs Immature Granulocytes: 0.01 10*3/uL (ref 0.00–0.07)
Basophils Absolute: 0 10*3/uL (ref 0.0–0.1)
Basophils Relative: 1 %
Eosinophils Absolute: 0 10*3/uL (ref 0.0–0.5)
Eosinophils Relative: 1 %
HCT: 27.9 % — ABNORMAL LOW (ref 39.0–52.0)
Hemoglobin: 9.4 g/dL — ABNORMAL LOW (ref 13.0–17.0)
Immature Granulocytes: 1 %
Lymphocytes Relative: 17 %
Lymphs Abs: 0.4 10*3/uL — ABNORMAL LOW (ref 0.7–4.0)
MCH: 28.4 pg (ref 26.0–34.0)
MCHC: 33.7 g/dL (ref 30.0–36.0)
MCV: 84.3 fL (ref 80.0–100.0)
Monocytes Absolute: 0.2 10*3/uL (ref 0.1–1.0)
Monocytes Relative: 10 %
Neutro Abs: 1.5 10*3/uL — ABNORMAL LOW (ref 1.7–7.7)
Neutrophils Relative %: 70 %
Platelets: 182 10*3/uL (ref 150–400)
RBC: 3.31 MIL/uL — ABNORMAL LOW (ref 4.22–5.81)
RDW: 17.6 % — ABNORMAL HIGH (ref 11.5–15.5)
WBC: 2.1 10*3/uL — ABNORMAL LOW (ref 4.0–10.5)
nRBC: 0 % (ref 0.0–0.2)

## 2021-11-15 LAB — RAD ONC ARIA SESSION SUMMARY
Course Elapsed Days: 42
Plan Fractions Treated to Date: 28
Plan Prescribed Dose Per Fraction: 2 Gy
Plan Total Fractions Prescribed: 35
Plan Total Prescribed Dose: 70 Gy
Reference Point Dosage Given to Date: 56 Gy
Reference Point Session Dosage Given: 2 Gy
Session Number: 28

## 2021-11-15 MED ORDER — FAMOTIDINE IN NACL 20-0.9 MG/50ML-% IV SOLN
20.0000 mg | Freq: Once | INTRAVENOUS | Status: AC
  Administered 2021-11-15: 20 mg via INTRAVENOUS
  Filled 2021-11-15: qty 50

## 2021-11-15 MED ORDER — SODIUM CHLORIDE 0.9 % IV SOLN
Freq: Once | INTRAVENOUS | Status: AC
  Filled 2021-11-15: qty 250

## 2021-11-15 MED ORDER — SODIUM CHLORIDE 0.9 % IV SOLN
162.0000 mg | Freq: Once | INTRAVENOUS | Status: AC
  Administered 2021-11-15: 160 mg via INTRAVENOUS
  Filled 2021-11-15: qty 16

## 2021-11-15 MED ORDER — SODIUM CHLORIDE 0.9 % IV SOLN
10.0000 mg | Freq: Once | INTRAVENOUS | Status: AC
  Administered 2021-11-15: 10 mg via INTRAVENOUS
  Filled 2021-11-15: qty 10

## 2021-11-15 MED ORDER — PALONOSETRON HCL INJECTION 0.25 MG/5ML
0.2500 mg | Freq: Once | INTRAVENOUS | Status: AC
  Administered 2021-11-15: 0.25 mg via INTRAVENOUS
  Filled 2021-11-15: qty 5

## 2021-11-15 MED ORDER — SODIUM CHLORIDE 0.9 % IV SOLN
45.0000 mg/m2 | Freq: Once | INTRAVENOUS | Status: AC
  Administered 2021-11-15: 84 mg via INTRAVENOUS
  Filled 2021-11-15: qty 14

## 2021-11-15 MED ORDER — DIPHENHYDRAMINE HCL 50 MG/ML IJ SOLN
50.0000 mg | Freq: Once | INTRAMUSCULAR | Status: AC
  Administered 2021-11-15: 50 mg via INTRAVENOUS
  Filled 2021-11-15: qty 1

## 2021-11-15 MED ORDER — HEPARIN SOD (PORK) LOCK FLUSH 100 UNIT/ML IV SOLN
500.0000 [IU] | Freq: Once | INTRAVENOUS | Status: AC | PRN
  Filled 2021-11-15: qty 5

## 2021-11-15 MED ORDER — SODIUM CHLORIDE 0.9% FLUSH
10.0000 mL | INTRAVENOUS | Status: DC | PRN
  Administered 2021-11-15: 10 mL via INTRAVENOUS
  Filled 2021-11-15: qty 10

## 2021-11-15 MED ORDER — HEPARIN SOD (PORK) LOCK FLUSH 100 UNIT/ML IV SOLN
INTRAVENOUS | Status: AC
  Administered 2021-11-15: 500 [IU]
  Filled 2021-11-15: qty 5

## 2021-11-15 NOTE — Patient Instructions (Signed)
[facility]  Discharge Instructions: Thank you for choosing [facility] to provide your oncology and hematology care.  If you have a lab appointment with the Cancer Center, please go directly to the Cancer Center and check in at the registration area.  Wear comfortable clothing and clothing appropriate for easy access to any Portacath or PICC line.   We strive to give you quality time with your provider. You may need to reschedule your appointment if you arrive late (15 or more minutes).  Arriving late affects you and other patients whose appointments are after yours.  Also, if you miss three or more appointments without notifying the office, you may be dismissed from the clinic at the provider's discretion.      For prescription refill requests, have your pharmacy contact our office and allow 72 hours for refills to be completed.    Today you received the following chemotherapy and/or immunotherapy agents Taxol and Carboplatin       To help prevent nausea and vomiting after your treatment, we encourage you to take your nausea medication as directed.  BELOW ARE SYMPTOMS THAT SHOULD BE REPORTED IMMEDIATELY: *FEVER GREATER THAN 100.4 F (38 C) OR HIGHER *CHILLS OR SWEATING *NAUSEA AND VOMITING THAT IS NOT CONTROLLED WITH YOUR NAUSEA MEDICATION *UNUSUAL SHORTNESS OF BREATH *UNUSUAL BRUISING OR BLEEDING *URINARY PROBLEMS (pain or burning when urinating, or frequent urination) *BOWEL PROBLEMS (unusual diarrhea, constipation, pain near the anus) TENDERNESS IN MOUTH AND THROAT WITH OR WITHOUT PRESENCE OF ULCERS (sore throat, sores in mouth, or a toothache) UNUSUAL RASH, SWELLING OR PAIN  UNUSUAL VAGINAL DISCHARGE OR ITCHING   Items with * indicate a potential emergency and should be followed up as soon as possible or go to the Emergency Department if any problems should occur.  Please show the CHEMOTHERAPY ALERT CARD or IMMUNOTHERAPY ALERT CARD at  check-in to the Emergency Department and triage nurse.  Should you have questions after your visit or need to cancel or reschedule your appointment, please contact [facility]  [phone] and follow the prompts.  Office hours are 8:00 a.m. to 4:30 p.m. Monday - Friday. Please note that voicemails left after 4:00 p.m. may not be returned until the following business day.  We are closed weekends and major holidays. You have access to a nurse at all times for urgent questions. Please call the main number to the clinic [phone] and follow the prompts.  For any non-urgent questions, you may also contact your provider using MyChart. We now offer e-Visits for anyone 18 and older to request care online for non-urgent symptoms. For details visit [email or web address].   Also download the MyChart app! Go to the app store, search "MyChart", open the app, select [facility], and log in with your MyChart username and password.  Masks are optional in the cancer centers. If you would like for your care team to wear a mask while they are taking care of you, please let them know. For doctor visits, patients may have with them one support person who is at least 77 years old. At this time, visitors are not allowed in the infusion area.  

## 2021-11-15 NOTE — Progress Notes (Signed)
Nutrition Assessment   Reason for Assessment:  Weight loss   ASSESSMENT:   76 year old male with squamous cell lung cancer.  Past medical history of CKD, CAD, DM, GERD, HTN, MI, diverticulitis.  Patient receiving concurrent chemotherapy and radiation.   Met with patient and wife during infusion.  Patient says that he is eating but not as much as he use too.  Breakfast is usually cereal or 1 egg with [place] bacon, or grits and flavored water (no sugar).  Lunch is usually chicken and noodle soup.  Usually does not eat much dinner.  Last night was some cube steak.  Drinks 1 oral nutrition supplement a day.  Denies trouble/pain swallowing foods.    Patient says that sometimes he gets dizzy when washing dishes or standing up.    Medications: metformin, MVI, protonix, carafate, coq10   Labs: Na 132, glucose 134, calcium 8.5   Anthropometrics:   Height: 65 inches Weight: 161 lb 7.8 oz  Says UBW is 180s Noted 181 lb on 04/17/21 BMI: 26  11% weight loss in the last 7 months, concerning   Estimated Energy Needs  Kcals: 1825-2200 Protein: 91-110 g Fluid: 1825-2200 ml   NUTRITION DIAGNOSIS: Inadequate oral intake related to cancer and cancer related treatment side effects as evidenced by 11% weight loss in the last 7 months and decreased intake   INTERVENTION:  Discussed importance of good nutrition Encouraged increasing oral nutrition supplement to at least BID for added calories and protein Recommend adding source of protein at every meal/snack.  Provided written examples of foods with protein in them to patient and wife.   Encouraged high calorie, high protein foods. Handout provided Discussed importance of hydration.    MONITORING, EVALUATION, GOAL: Weight trends, intake   Next Visit: Phone call Wednesday, October 11th  Luke Rojas B. Zenia Resides, [place], [place] Registered Dietitian [phone]

## 2021-11-15 NOTE — Progress Notes (Signed)
Symptom Management [employer] at [facility] [phone]) [phone] [phone]  Patient Care Team: Virginia Crews, MD as PCP - General (Family Medicine) Thelma Comp, [place] as Consulting Physician (Optometry) Royston Cowper, MD as Consulting Physician (Urology) Yolonda Kida, MD as Consulting Physician (Cardiology) Telford Nab, RN as Oncology Nurse Navigator   NAME OF PATIENT: Luke Rojas  [id number]  Jun 25, 1945   DATE OF VISIT: 11/15/21  REASON FOR CONSULT: ATHANASIOS HELDMAN is a 76 y.o. male with multiple medical problems including stage II squamous cell lung cancer, prior history of smoking, history of spinal stenosis.   Patient is on active treatment with weekly CarboTaxol chemotherapy.  INTERVAL HISTORY:  Patient saw Dr. Janese Banks on 11/08/2021 for cycle 5 carbo Taxol chemotherapy.  He returns to clinic today for cycle 6.  Patient was an add-on to [facility] today for evaluation of hypotension.  Initial pressure 90/58 on arrival for chemotherapy.  Patient endorses recent "swimmy headedness" primarily with position changes when he goes from lying to sitting or sitting to standing.  He denies falls.  No visual changes or headaches.  No focal weakness.  No nausea, vomiting, or diarrhea.  He has not been eating or drinking much recently.  3 pound weight loss noted in the past week.  Denies any neurologic complaints. Denies recent fevers or illnesses. Denies any easy bleeding or bruising.  Denies urinary complaints. Patient offers no further specific complaints today.   PAST MEDICAL HISTORY: Past Medical History:  Diagnosis Date   Arthritis    OSTEOARTHRITIS   Chronic kidney disease    kidney stones   COPD (chronic obstructive pulmonary disease) (HCC)    Coronary artery disease    2 stents    Diabetes mellitus without complication (HCC)    GERD (gastroesophageal reflux disease)    Hypercholesteremia    Hypertension     Lung cancer ([place])    Myocardial infarction ([place]) 7124   Renal colic     PAST SURGICAL HISTORY:  Past Surgical History:  Procedure Laterality Date   CARDIAC CATHETERIZATION  2013   2 stents   CAROTID PTA/STENT INTERVENTION Right 09/11/2021   Procedure: CAROTID PTA/STENT INTERVENTION;  Surgeon: Algernon Huxley, MD;  Location: [facility];  Service: Cardiovascular;  Laterality: Right;   CATARACT EXTRACTION, BILATERAL     COLONOSCOPY  04/27/05   COLONOSCOPY WITH PROPOFOL N/A 06/08/2015   Procedure: COLONOSCOPY WITH PROPOFOL;  Surgeon: Robert Bellow, MD;  Location: [facility];  Service: Endoscopy;  Laterality: N/A;   CYSTOSCOPY WITH INSERTION OF UROLIFT N/A 02/11/2020   Procedure: CYSTOSCOPY WITH INSERTION OF UROLIFT;  Surgeon: Royston Cowper, MD;  Location: [facility];  Service: Urology;  Laterality: N/A;   CYSTOSCOPY WITH STENT PLACEMENT Right 09/16/2016   Procedure: CYSTOSCOPY WITH STENT PLACEMENT;  Surgeon: Royston Cowper, MD;  Location: [facility];  Service: Urology;  Laterality: Right;   EXTRACORPOREAL SHOCK WAVE LITHOTRIPSY Left 11/04/2014   has had 2 previous lithotripsies   EXTRACORPOREAL SHOCK WAVE LITHOTRIPSY Left 03/24/2015   Procedure: EXTRACORPOREAL SHOCK WAVE LITHOTRIPSY (ESWL);  Surgeon: Royston Cowper, MD;  Location: [facility];  Service: Urology;  Laterality: Left;   EXTRACORPOREAL SHOCK WAVE LITHOTRIPSY Right 10/04/2016   Procedure: EXTRACORPOREAL SHOCK WAVE LITHOTRIPSY (ESWL);  Surgeon: Royston Cowper, MD;  Location: [facility];  Service: Urology;  Laterality: Right;   LEFT HEART CATH AND CORONARY ANGIOGRAPHY N/A 12/05/2018   Procedure: LEFT HEART CATH AND CORONARY ANGIOGRAPHY with possible  pci and stent;  Surgeon: Yolonda Kida, MD;  Location: [facility];  Service: Cardiovascular;  Laterality: N/A;   PORTA CATH INSERTION Right 09/25/2021   Procedure: PORTA CATH INSERTION;  Surgeon: Algernon Huxley, MD;  Location: [facility];  Service: Cardiovascular;   Laterality: Right;    HEMATOLOGY/ONCOLOGY HISTORY:  Oncology History  Malignant neoplasm of upper lobe of right lung ([place])  09/18/2021 Initial Diagnosis   Malignant neoplasm of upper lobe of right lung ([place])   09/18/2021 Cancer Staging   Staging form: Lung, AJCC 8th Edition - Clinical stage from 09/18/2021: Stage IIB (cT2, cN1, cM0) - Signed by Sindy Guadeloupe, MD on 09/18/2021 Histopathologic type: Squamous cell carcinoma, NOS   10/02/2021 - 10/23/2021 Chemotherapy   Patient is on Treatment Plan : LUNG Carboplatin / Paclitaxel + XRT q7d     10/30/2021 -  Chemotherapy   Patient is on Treatment Plan : LUNG Carboplatin + Paclitaxel + XRT q7d       ALLERGIES:  is allergic to codeine.  MEDICATIONS:  Current Outpatient Medications  Medication Sig Dispense Refill   amLODipine (NORVASC) 5 MG tablet Take 1 tablet (5 mg total) by mouth daily. 90 tablet 3   aspirin EC 81 MG tablet Take 1 tablet (81 mg total) by mouth daily. Swallow whole. 30 tablet 12   atorvastatin (LIPITOR) 80 MG tablet TAKE 1 TABLET BY MOUTH EVERY NIGHT AT BEDTIME 90 tablet 3   clopidogrel (PLAVIX) 75 MG tablet Take 75 mg by mouth daily.     co-enzyme Q-10 30 MG capsule Take 30 mg by mouth daily.      diclofenac sodium (VOLTAREN) 1 % GEL Apply 2 g topically 4 (four) times daily. 100 g 3   docusate sodium (COLACE) 100 MG capsule Take 1 capsule (100 mg total) by mouth 2 (two) times daily. 60 capsule 0   Krill Oil 1000 MG CAPS Take 1,000 mg by mouth daily.      metFORMIN (GLUCOPHAGE-XR) 750 MG 24 hr tablet TAKE 1 TABLET BY MOUTH ONCE A DAY WITH BREAKFAST 90 tablet 1   metoprolol tartrate (LOPRESSOR) 25 MG tablet Take 1 tablet (25 mg total) by mouth 2 (two) times daily. 60 tablet 2   MULTIPLE VITAMIN PO Take 1 tablet by mouth daily.     OneTouch Delica Lancets 29J MISC TEST FASTING GLUCOSE LEVEL EACH MORNING BEFORE BREAKFAST 100 each 4   oxyCODONE (OXY IR/ROXICODONE) 5 MG immediate release tablet Take 1 tablet (5 mg total) by  mouth every 6 (six) hours as needed for severe pain. (Patient not taking: Reported on 11/08/2021) 30 tablet 0   pantoprazole (PROTONIX) 20 MG tablet Take 2 tablets (40 mg total) by mouth daily. (Patient not taking: Reported on 11/08/2021) 30 tablet 2   polyethylene glycol (MIRALAX / GLYCOLAX) 17 g packet Take 17 g by mouth daily. 30 packet 0   potassium chloride SA (KLOR-CON M) 20 MEQ tablet Take 1 tablet (20 mEq total) by mouth daily. 7 tablet 0   Pumpkin Seed-Soy Germ (AZO BLADDER CONTROL/GO-LESS PO) Take by mouth.     ramipril (ALTACE) 10 MG capsule Take 1 capsule (10 mg total) by mouth daily. (Patient not taking: Reported on 11/08/2021) 30 capsule 2   sucralfate (CARAFATE) 1 g tablet Take 1 tablet (1 g total) by mouth 4 (four) times daily as needed (reflux). 60 tablet 0   tamsulosin (FLOMAX) 0.4 MG CAPS capsule Take 0.4 mg by mouth daily.  No current facility-administered medications for this visit.   Facility-Administered Medications Ordered in Other Visits  Medication Dose Route Frequency Provider Last Rate Last Admin   CARBOplatin (PARAPLATIN) 160 mg in sodium chloride 0.9 % 100 mL chemo infusion  160 mg Intravenous Once Sindy Guadeloupe, MD       dexamethasone (DECADRON) 10 mg in sodium chloride 0.9 % 50 mL IVPB  10 mg Intravenous Once Sindy Guadeloupe, MD       famotidine (PEPCID) IVPB 20 mg premix  20 mg Intravenous Once Sindy Guadeloupe, MD       heparin lock flush 100 UNIT/ML injection            heparin lock flush 100 unit/mL  500 Units Intracatheter Once PRN Sindy Guadeloupe, MD       PACLitaxel (TAXOL) 84 mg in sodium chloride 0.9 % 250 mL chemo infusion (</= 80mg /m2)  45 mg/m2 (Treatment Plan Recorded) Intravenous Once Sindy Guadeloupe, MD        VITAL SIGNS: There were no vitals taken for this visit. There were no vitals filed for this visit.  Estimated body mass index is 26.87 kg/m as calculated from the following:   Height as of 10/30/21: 5\' 5"  (1.651 m).   Weight as of an earlier  encounter on 11/15/21: 161 lb 7.8 oz (73.2 kg).  LABS: CBC:    Component Value Date/Time   WBC 2.1 (L) 11/15/2021 0839   HGB 9.4 (L) 11/15/2021 0839   HGB 14.1 12/04/2019 0810   HCT 27.9 (L) 11/15/2021 0839   HCT 42.4 12/04/2019 0810   PLT 182 11/15/2021 0839   PLT 186 12/04/2019 0810   MCV 84.3 11/15/2021 0839   MCV 89 12/04/2019 0810   MCV 88 10/02/2011 1051   NEUTROABS 1.5 (L) 11/15/2021 0839   NEUTROABS 8.9 (H) 12/04/2019 0810   LYMPHSABS 0.4 (L) 11/15/2021 0839   LYMPHSABS 3.2 (H) 12/04/2019 0810   MONOABS 0.2 11/15/2021 0839   EOSABS 0.0 11/15/2021 0839   EOSABS 0.0 12/04/2019 0810   BASOSABS 0.0 11/15/2021 0839   BASOSABS 0.0 12/04/2019 0810   Comprehensive Metabolic Panel:    Component Value Date/Time   NA 132 (L) 11/15/2021 0839   NA 139 04/04/2021 0926   NA 139 10/02/2011 1051   K 4.0 11/15/2021 0839   K 4.2 10/02/2011 1051   CL 105 11/15/2021 0839   CL 106 10/02/2011 1051   CO2 22 11/15/2021 0839   CO2 27 10/02/2011 1051   BUN 18 11/15/2021 0839   BUN 19 04/04/2021 0926   BUN 21 (H) 10/02/2011 1051   CREATININE 1.18 11/15/2021 0839   CREATININE 1.88 (H) 10/02/2011 1051   GLUCOSE 134 (H) 11/15/2021 0839   GLUCOSE 97 10/02/2011 1051   CALCIUM 8.5 (L) 11/15/2021 0839   CALCIUM 8.4 (L) 10/02/2011 1051   AST 22 11/15/2021 0839   AST 25 09/21/2011 1245   ALT 22 11/15/2021 0839   ALT 34 09/21/2011 1245   ALKPHOS 66 11/15/2021 0839   ALKPHOS 97 09/21/2011 1245   BILITOT 0.7 11/15/2021 0839   BILITOT 0.5 04/04/2021 0926   BILITOT 0.6 09/21/2011 1245   PROT 6.2 (L) 11/15/2021 0839   PROT 6.6 04/04/2021 0926   PROT 7.7 09/21/2011 1245   ALBUMIN 3.4 (L) 11/15/2021 0839   ALBUMIN 4.0 04/04/2021 0926   ALBUMIN 4.0 09/21/2011 1245    RADIOGRAPHIC STUDIES: DG Chest 2 View  Result Date: 10/30/2021 CLINICAL DATA:  Chest pain on and  off for several days EXAM: CHEST - 2 VIEW COMPARISON:  10/15/2021 FINDINGS: Unchanged right upper lobe opacity, a mass by CT.  Porta catheter on the right with tip at the upper cavoatrial junction. Mild interstitial coarsening. There is no edema, consolidation, effusion, or pneumothorax. Normal heart size and mediastinal contours. Coronary stenting. IMPRESSION: No active cardiopulmonary disease. Electronically Signed   By: Jorje Guild M.D.   On: 10/30/2021 12:11   VAS US CAROTID  Result Date: 10/25/2021 Carotid Arterial Duplex Study Patient Name:  HAWK MONES  Date of Exam:   10/18/2021 Medical Rec #: [id number]           Accession #:    [id number] Date of Birth: October 14, 1945          Patient Gender: M Patient Age:   28 years Exam Location:  [facility] Procedure:      VAS US CAROTID Referring Phys: Leotis Pain --------------------------------------------------------------------------------  Indications:  Carotid artery disease and left endarterectomy. Risk Factors: Hypertension, hyperlipidemia, Diabetes, past history of smoking,               coronary artery disease. Performing Technologist: Delorise Shiner RVT  Examination Guidelines: A complete evaluation includes B-mode imaging, spectral Doppler, color Doppler, and power Doppler as needed of all accessible portions of each vessel. Bilateral testing is considered an integral part of a complete examination. Limited examinations for reoccurring indications may be performed as noted.  Right Carotid Findings: +----------+--------+--------+--------+------------------+--------+           PSV cm/sEDV cm/sStenosisPlaque DescriptionComments +----------+--------+--------+--------+------------------+--------+ CCA Prox  70      0                                          +----------+--------+--------+--------+------------------+--------+ CCA Mid   77      13                                         +----------+--------+--------+--------+------------------+--------+ CCA Distal79      15                                stent     +----------+--------+--------+--------+------------------+--------+ ICA Prox  70      17                                stent    +----------+--------+--------+--------+------------------+--------+ ICA Mid   73      23                                         +----------+--------+--------+--------+------------------+--------+ ICA Distal96      26                                         +----------+--------+--------+--------+------------------+--------+ ECA       167     13                                         +----------+--------+--------+--------+------------------+--------+ +----------+--------+-------+----------------+-------------------+  PSV cm/sEDV cmsDescribe        Arm Pressure (mmHG) +----------+--------+-------+----------------+-------------------+ Subclavian112     0      Multiphasic, WNL                    +----------+--------+-------+----------------+-------------------+ +---------+--------+--+--------+-+---------+ VertebralPSV cm/s47EDV cm/s8Antegrade +---------+--------+--+--------+-+---------+  Right Stent(s): +----------------+--------+--------+--------+--------+--------+ Distal CCA - ICAPSV cm/sEDV cm/sStenosisWaveformComments +----------------+--------+--------+--------+--------+--------+ Prox to Stent   84      17                               +----------------+--------+--------+--------+--------+--------+ Proximal Stent  68      13                               +----------------+--------+--------+--------+--------+--------+ Mid Stent       62      10                               +----------------+--------+--------+--------+--------+--------+ Distal Stent    58      14                               +----------------+--------+--------+--------+--------+--------+ Distal to Stent 72      23                               +----------------+--------+--------+--------+--------+--------+   Left Carotid  Findings: +----------+--------+--------+--------+------------------+--------+           PSV cm/sEDV cm/sStenosisPlaque DescriptionComments +----------+--------+--------+--------+------------------+--------+ CCA Prox  103     16                                         +----------+--------+--------+--------+------------------+--------+ CCA Mid   104     16                                         +----------+--------+--------+--------+------------------+--------+ CCA Distal89      19                                         +----------+--------+--------+--------+------------------+--------+ ICA Prox  75      26      1-39%   smooth                     +----------+--------+--------+--------+------------------+--------+ ICA Mid   88      26                                         +----------+--------+--------+--------+------------------+--------+ ICA Distal75      25                                         +----------+--------+--------+--------+------------------+--------+ ECA       138  0                                          +----------+--------+--------+--------+------------------+--------+ +----------+--------+--------+----------------+-------------------+           PSV cm/sEDV cm/sDescribe        Arm Pressure (mmHG) +----------+--------+--------+----------------+-------------------+ Subclavian122     0       Multiphasic, WNL                    +----------+--------+--------+----------------+-------------------+ +---------+--------+--+--------+--+---------+ VertebralPSV cm/s72EDV cm/s18Antegrade +---------+--------+--+--------+--+---------+   Summary: Right Carotid: There is no evidence of stenosis in the right ICA. Patent ICA                stent. Left Carotid: Velocities in the left ICA are consistent with a 1-39% stenosis. Vertebrals:  Bilateral vertebral arteries demonstrate antegrade flow. Subclavians: Normal flow hemodynamics were  seen in bilateral subclavian              arteries. *See table(s) above for measurements and observations.  Electronically signed by Leotis Pain MD on 10/25/2021 at 8:16:58 AM.    Final     PERFORMANCE STATUS (ECOG) : 2 - Symptomatic, <50% confined to bed  Review of Systems Unless otherwise noted, a complete review of systems is negative.  Physical Exam General: NAD Cardiovascular: regular rate and rhythm Pulmonary: clear ant fields Abdomen: soft, nontender, + bowel sounds GU: no suprapubic tenderness Extremities: no edema, no joint deformities Skin: no rashes Neurological: Weakness but otherwise nonfocal  IMPRESSION/PLAN: Orthostatic hypotension -Labs show mild hyponatremia likely secondary to dehydration.  We will proceed with gentle IV fluids today.  Patient is on several antihypertensives including amlodipine, metoprolol, Altace.  Recommended holding antihypertensives today and patient has scheduled follow-up with his cardiologist tomorrow and will discuss dose adjustment of medications.  We discussed option for intermittent supportive care or IV fluids in clinic.  Encouraged p.o. intake.  Patient saw nutritionist today as well.  Weakness -we will order patient a walker.  Fall precautions reviewed.     Durable Medical Equipment  (From admission, onward)           Start     Ordered   11/15/21 0000  For home use only DME 4 wheeled rolling walker with seat       Question:  Patient needs a walker to treat with the following condition  Answer:  Weakness   11/15/21 1021              Patient expressed understanding and was in agreement with this plan. He also understands that He can call clinic at any time with any questions, concerns, or complaints.   Thank you for allowing me to participate in the care of this very pleasant patient.   Time Total: 20 minutes  Visit consisted of counseling and education dealing with the complex and emotionally intense issues of symptom  management in the setting of serious illness.Greater than 50%  of this time was spent counseling and coordinating care related to the above assessment and plan.  Signed by: Altha Harm, PhD, NP-C

## 2021-11-16 ENCOUNTER — Ambulatory Visit
Admission: RE | Admit: 2021-11-16 | Discharge: 2021-11-16 | Disposition: A | Discharge status: Home / Self Care | Payer: Medicare Other | Source: Ambulatory Visit | Attending: Radiation Oncology | Admitting: Radiation Oncology

## 2021-11-16 ENCOUNTER — Other Ambulatory Visit: Payer: Self-pay

## 2021-11-16 DIAGNOSIS — Z87891 Personal history of nicotine dependence: Secondary | ICD-10-CM | POA: Diagnosis not present

## 2021-11-16 DIAGNOSIS — F17220 Nicotine dependence, chewing tobacco, uncomplicated: Secondary | ICD-10-CM | POA: Diagnosis not present

## 2021-11-16 DIAGNOSIS — Z5111 Encounter for antineoplastic chemotherapy: Secondary | ICD-10-CM | POA: Diagnosis not present

## 2021-11-16 DIAGNOSIS — J449 Chronic obstructive pulmonary disease, unspecified: Secondary | ICD-10-CM | POA: Diagnosis not present

## 2021-11-16 DIAGNOSIS — C3411 Malignant neoplasm of upper lobe, right bronchus or lung: Secondary | ICD-10-CM | POA: Diagnosis not present

## 2021-11-16 DIAGNOSIS — Z955 Presence of coronary angioplasty implant and graft: Secondary | ICD-10-CM | POA: Diagnosis not present

## 2021-11-16 DIAGNOSIS — I951 Orthostatic hypotension: Secondary | ICD-10-CM | POA: Diagnosis not present

## 2021-11-16 DIAGNOSIS — Z95828 Presence of other vascular implants and grafts: Secondary | ICD-10-CM | POA: Diagnosis not present

## 2021-11-16 DIAGNOSIS — E119 Type 2 diabetes mellitus without complications: Secondary | ICD-10-CM | POA: Diagnosis not present

## 2021-11-16 DIAGNOSIS — Z510 Encounter for antineoplastic radiation therapy: Secondary | ICD-10-CM | POA: Diagnosis not present

## 2021-11-16 DIAGNOSIS — D6481 Anemia due to antineoplastic chemotherapy: Secondary | ICD-10-CM | POA: Diagnosis not present

## 2021-11-16 DIAGNOSIS — E782 Mixed hyperlipidemia: Secondary | ICD-10-CM | POA: Diagnosis not present

## 2021-11-16 DIAGNOSIS — R600 Localized edema: Secondary | ICD-10-CM | POA: Diagnosis not present

## 2021-11-16 DIAGNOSIS — D701 Agranulocytosis secondary to cancer chemotherapy: Secondary | ICD-10-CM | POA: Diagnosis not present

## 2021-11-16 DIAGNOSIS — I25118 Atherosclerotic heart disease of native coronary artery with other forms of angina pectoris: Secondary | ICD-10-CM | POA: Diagnosis not present

## 2021-11-16 LAB — RAD ONC ARIA SESSION SUMMARY
Course Elapsed Days: 43
Plan Fractions Treated to Date: 29
Plan Prescribed Dose Per Fraction: 2 Gy
Plan Total Fractions Prescribed: 35
Plan Total Prescribed Dose: 70 Gy
Reference Point Dosage Given to Date: 58 Gy
Reference Point Session Dosage Given: 2 Gy
Session Number: 29

## 2021-11-17 ENCOUNTER — Other Ambulatory Visit: Payer: Self-pay

## 2021-11-17 ENCOUNTER — Ambulatory Visit
Admission: RE | Admit: 2021-11-17 | Discharge: 2021-11-17 | Disposition: A | Discharge status: Home / Self Care | Payer: Medicare Other | Source: Ambulatory Visit | Attending: Radiation Oncology | Admitting: Radiation Oncology

## 2021-11-17 DIAGNOSIS — D701 Agranulocytosis secondary to cancer chemotherapy: Secondary | ICD-10-CM | POA: Diagnosis not present

## 2021-11-17 DIAGNOSIS — Z510 Encounter for antineoplastic radiation therapy: Secondary | ICD-10-CM | POA: Diagnosis not present

## 2021-11-17 DIAGNOSIS — C3411 Malignant neoplasm of upper lobe, right bronchus or lung: Secondary | ICD-10-CM | POA: Diagnosis not present

## 2021-11-17 DIAGNOSIS — F17220 Nicotine dependence, chewing tobacco, uncomplicated: Secondary | ICD-10-CM | POA: Diagnosis not present

## 2021-11-17 DIAGNOSIS — D6481 Anemia due to antineoplastic chemotherapy: Secondary | ICD-10-CM | POA: Diagnosis not present

## 2021-11-17 DIAGNOSIS — Z5111 Encounter for antineoplastic chemotherapy: Secondary | ICD-10-CM | POA: Diagnosis not present

## 2021-11-17 DIAGNOSIS — Z87891 Personal history of nicotine dependence: Secondary | ICD-10-CM | POA: Diagnosis not present

## 2021-11-17 LAB — RAD ONC ARIA SESSION SUMMARY
Course Elapsed Days: 44
Plan Fractions Treated to Date: 30
Plan Prescribed Dose Per Fraction: 2 Gy
Plan Total Fractions Prescribed: 35
Plan Total Prescribed Dose: 70 Gy
Reference Point Dosage Given to Date: 60 Gy
Reference Point Session Dosage Given: 2 Gy
Session Number: 30

## 2021-11-20 ENCOUNTER — Ambulatory Visit
Admission: RE | Admit: 2021-11-20 | Discharge: 2021-11-20 | Disposition: A | Discharge status: Home / Self Care | Payer: Medicare Other | Source: Ambulatory Visit | Attending: Radiation Oncology | Admitting: Radiation Oncology

## 2021-11-20 ENCOUNTER — Other Ambulatory Visit: Payer: Self-pay

## 2021-11-20 DIAGNOSIS — F17220 Nicotine dependence, chewing tobacco, uncomplicated: Secondary | ICD-10-CM | POA: Diagnosis not present

## 2021-11-20 DIAGNOSIS — Z5111 Encounter for antineoplastic chemotherapy: Secondary | ICD-10-CM | POA: Diagnosis not present

## 2021-11-20 DIAGNOSIS — D701 Agranulocytosis secondary to cancer chemotherapy: Secondary | ICD-10-CM | POA: Diagnosis not present

## 2021-11-20 DIAGNOSIS — Z87891 Personal history of nicotine dependence: Secondary | ICD-10-CM | POA: Diagnosis not present

## 2021-11-20 DIAGNOSIS — C3411 Malignant neoplasm of upper lobe, right bronchus or lung: Secondary | ICD-10-CM | POA: Diagnosis not present

## 2021-11-20 DIAGNOSIS — D6481 Anemia due to antineoplastic chemotherapy: Secondary | ICD-10-CM | POA: Diagnosis not present

## 2021-11-20 DIAGNOSIS — Z510 Encounter for antineoplastic radiation therapy: Secondary | ICD-10-CM | POA: Diagnosis not present

## 2021-11-20 LAB — RAD ONC ARIA SESSION SUMMARY
Course Elapsed Days: 47
Plan Fractions Treated to Date: 31
Plan Prescribed Dose Per Fraction: 2 Gy
Plan Total Fractions Prescribed: 35
Plan Total Prescribed Dose: 70 Gy
Reference Point Dosage Given to Date: 62 Gy
Reference Point Session Dosage Given: 2 Gy
Session Number: 31

## 2021-11-20 MED FILL — Dexamethasone Sodium Phosphate Inj 100 MG/10ML: INTRAMUSCULAR | Qty: 1 | Status: AC

## 2021-11-21 ENCOUNTER — Inpatient Hospital Stay: Payer: Medicare Other | Admitting: Oncology

## 2021-11-21 ENCOUNTER — Inpatient Hospital Stay: Payer: Medicare Other

## 2021-11-21 ENCOUNTER — Encounter: Payer: Self-pay | Admitting: Oncology

## 2021-11-21 ENCOUNTER — Other Ambulatory Visit: Payer: Self-pay

## 2021-11-21 ENCOUNTER — Ambulatory Visit
Admission: RE | Admit: 2021-11-21 | Discharge: 2021-11-21 | Disposition: A | Discharge status: Home / Self Care | Payer: Medicare Other | Source: Ambulatory Visit | Attending: Radiation Oncology | Admitting: Radiation Oncology

## 2021-11-21 ENCOUNTER — Other Ambulatory Visit: Payer: Medicare Other

## 2021-11-21 VITALS — BP 91/53 | HR 96 | Temp 97.3°F | Resp 16 | Wt 160.9 lb

## 2021-11-21 DIAGNOSIS — C3411 Malignant neoplasm of upper lobe, right bronchus or lung: Secondary | ICD-10-CM

## 2021-11-21 DIAGNOSIS — Z5111 Encounter for antineoplastic chemotherapy: Secondary | ICD-10-CM | POA: Diagnosis not present

## 2021-11-21 DIAGNOSIS — F17220 Nicotine dependence, chewing tobacco, uncomplicated: Secondary | ICD-10-CM | POA: Diagnosis not present

## 2021-11-21 DIAGNOSIS — D701 Agranulocytosis secondary to cancer chemotherapy: Secondary | ICD-10-CM

## 2021-11-21 DIAGNOSIS — T451X5A Adverse effect of antineoplastic and immunosuppressive drugs, initial encounter: Secondary | ICD-10-CM

## 2021-11-21 DIAGNOSIS — Z87891 Personal history of nicotine dependence: Secondary | ICD-10-CM | POA: Diagnosis not present

## 2021-11-21 DIAGNOSIS — D6481 Anemia due to antineoplastic chemotherapy: Secondary | ICD-10-CM | POA: Diagnosis not present

## 2021-11-21 DIAGNOSIS — Z510 Encounter for antineoplastic radiation therapy: Secondary | ICD-10-CM | POA: Diagnosis not present

## 2021-11-21 LAB — RAD ONC ARIA SESSION SUMMARY
Course Elapsed Days: 48
Plan Fractions Treated to Date: 32
Plan Prescribed Dose Per Fraction: 2 Gy
Plan Total Fractions Prescribed: 35
Plan Total Prescribed Dose: 70 Gy
Reference Point Dosage Given to Date: 64 Gy
Reference Point Session Dosage Given: 2 Gy
Session Number: 32

## 2021-11-21 LAB — CBC WITH DIFFERENTIAL/PLATELET
Abs Immature Granulocytes: 0 10*3/uL (ref 0.00–0.07)
Basophils Absolute: 0 10*3/uL (ref 0.0–0.1)
Basophils Relative: 1 %
Eosinophils Absolute: 0 10*3/uL (ref 0.0–0.5)
Eosinophils Relative: 1 %
HCT: 25.9 % — ABNORMAL LOW (ref 39.0–52.0)
Hemoglobin: 8.8 g/dL — ABNORMAL LOW (ref 13.0–17.0)
Immature Granulocytes: 0 %
Lymphocytes Relative: 26 %
Lymphs Abs: 0.3 10*3/uL — ABNORMAL LOW (ref 0.7–4.0)
MCH: 28.3 pg (ref 26.0–34.0)
MCHC: 34 g/dL (ref 30.0–36.0)
MCV: 83.3 fL (ref 80.0–100.0)
Monocytes Absolute: 0.2 10*3/uL (ref 0.1–1.0)
Monocytes Relative: 18 %
Neutro Abs: 0.6 10*3/uL — ABNORMAL LOW (ref 1.7–7.7)
Neutrophils Relative %: 54 %
Platelets: 202 10*3/uL (ref 150–400)
RBC: 3.11 MIL/uL — ABNORMAL LOW (ref 4.22–5.81)
RDW: 18 % — ABNORMAL HIGH (ref 11.5–15.5)
WBC: 1 10*3/uL — CL (ref 4.0–10.5)
nRBC: 0 % (ref 0.0–0.2)

## 2021-11-21 LAB — COMPREHENSIVE METABOLIC PANEL
ALT: 23 U/L (ref 0–44)
AST: 24 U/L (ref 15–41)
Albumin: 3.4 g/dL — ABNORMAL LOW (ref 3.5–5.0)
Alkaline Phosphatase: 66 U/L (ref 38–126)
Anion gap: 6 (ref 5–15)
BUN: 21 mg/dL (ref 8–23)
CO2: 22 mmol/L (ref 22–32)
Calcium: 8.4 mg/dL — ABNORMAL LOW (ref 8.9–10.3)
Chloride: 106 mmol/L (ref 98–111)
Creatinine, Ser: 1.19 mg/dL (ref 0.61–1.24)
GFR, Estimated: >60 mL/min (ref >60)
Glucose, Bld: 127 mg/dL — ABNORMAL HIGH (ref 70–99)
Potassium: 3.6 mmol/L (ref 3.5–5.1)
Sodium: 134 mmol/L — ABNORMAL LOW (ref 135–145)
Total Bilirubin: 0.8 mg/dL (ref 0.3–1.2)
Total Protein: 6.5 g/dL (ref 6.5–8.1)

## 2021-11-21 NOTE — Progress Notes (Signed)
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  He quit smoking about 30 years ago but still chews tobacco.  He presented to the ER with symptoms of neck pain which led to an MRI cervical spine as well as CT angio chest.  MRI cervical spine showed degenerative changes along with moderate spinal stenosis and mass effect between C3-C4 and C4-C5.  Foraminal stenosis at C3-C4 C5-C6-C7 nerve levels.  Nonspecific 10 mm T3 vertebral body lesion indeterminate for bone metastases.  CT angio chest showed a 4.5 x 4.9 x 3.7 cm right upper lobe peripheral mass with probable pleural involvement and potential early invasion into right middle lobe.  Borderline enlarged right hilar lymph nodes measuring up to 1.2 cm.   PET CT scan showed hypermetabolic 4.8 cm right upper lobe mass consistent with primary bronchogenic carcinoma and mild asymmetric FDG activity in the right suprahilar region suspicious for right hilar lymph node metastases.  Patient had a CT-guided right upper lobe lung biopsy which was consistent with squamous cell carcinoma.  Interval history-patient reports ongoing fatigue which is no worse as compared to before.  Denies any chest pain.  Does not feel dizzy as  often.  ECOG PS- 2 Pain scale- 0   Review of systems- Review of Systems  Constitutional:  Positive for malaise/fatigue. Negative for chills, fever and weight loss.  HENT:  Negative for congestion, ear discharge and nosebleeds.   Eyes:  Negative for blurred vision.  Respiratory:  Negative for cough, hemoptysis, sputum production, shortness of breath and wheezing.   Cardiovascular:  Negative for chest pain, palpitations, orthopnea and claudication.  Gastrointestinal:  Negative for abdominal pain, blood in stool, constipation, diarrhea, heartburn, melena, nausea and vomiting.  Genitourinary:  Negative for dysuria, flank pain, frequency, hematuria and urgency.  Musculoskeletal:  Negative for back pain, joint pain and myalgias.  Skin:  Negative for rash.  Neurological:  Negative for dizziness, tingling, focal weakness, seizures, weakness and headaches.  Endo/Heme/Allergies:  Does not bruise/bleed easily.  Psychiatric/Behavioral:  Negative for depression and suicidal ideas. The patient does not have insomnia.       Allergies  Allergen Reactions   Codeine Nausea Only, Nausea And Vomiting and Other (See Comments)    Other reaction(s): Vomiting     Past Medical History:  Diagnosis Date   Arthritis    OSTEOARTHRITIS   Chronic kidney disease    kidney stones   COPD (chronic obstructive pulmonary disease) (HCC)    Coronary artery disease    2 stents    Diabetes mellitus without complication (HCC)    GERD (gastroesophageal reflux disease)    Hypercholesteremia    Hypertension    Lung cancer ([place])    Myocardial infarction [facility]) [id number]   Renal colic      Past Surgical History:  Procedure Laterality Date  CARDIAC CATHETERIZATION  2013   2 stents   CAROTID PTA/STENT INTERVENTION Right 09/11/2021   Procedure: CAROTID PTA/STENT INTERVENTION;  Surgeon: Algernon Huxley, MD;  Location: [place] [facility];  Service: Cardiovascular;  Laterality: Right;   CATARACT EXTRACTION, BILATERAL      COLONOSCOPY  04/27/05   COLONOSCOPY WITH PROPOFOL N/A 06/08/2015   Procedure: COLONOSCOPY WITH PROPOFOL;  Surgeon: Robert Bellow, MD;  Location: [facility];  Service: Endoscopy;  Laterality: N/A;   CYSTOSCOPY WITH INSERTION OF UROLIFT N/A 02/11/2020   Procedure: CYSTOSCOPY WITH INSERTION OF UROLIFT;  Surgeon: Royston Cowper, MD;  Location: [facility];  Service: Urology;  Laterality: N/A;   CYSTOSCOPY WITH STENT PLACEMENT Right 09/16/2016   Procedure: CYSTOSCOPY WITH STENT PLACEMENT;  Surgeon: Royston Cowper, MD;  Location: [facility];  Service: Urology;  Laterality: Right;   EXTRACORPOREAL SHOCK WAVE LITHOTRIPSY Left 11/04/2014   has had 2 previous lithotripsies   EXTRACORPOREAL SHOCK WAVE LITHOTRIPSY Left 03/24/2015   Procedure: EXTRACORPOREAL SHOCK WAVE LITHOTRIPSY (ESWL);  Surgeon: Royston Cowper, MD;  Location: [facility];  Service: Urology;  Laterality: Left;   EXTRACORPOREAL SHOCK WAVE LITHOTRIPSY Right 10/04/2016   Procedure: EXTRACORPOREAL SHOCK WAVE LITHOTRIPSY (ESWL);  Surgeon: Royston Cowper, MD;  Location: [facility];  Service: Urology;  Laterality: Right;   LEFT HEART CATH AND CORONARY ANGIOGRAPHY N/A 12/05/2018   Procedure: LEFT HEART CATH AND CORONARY ANGIOGRAPHY with possible pci and stent;  Surgeon: Yolonda Kida, MD;  Location: [place] [facility];  Service: Cardiovascular;  Laterality: N/A;   PORTA CATH INSERTION Right 09/25/2021   Procedure: PORTA CATH INSERTION;  Surgeon: Algernon Huxley, MD;  Location: [place] [facility];  Service: Cardiovascular;  Laterality: Right;    Social History   Socioeconomic History   Marital status: Married    Spouse name: Not on file   Number of children: 2   Years of education: Not on file   Highest education level: 8th grade  Occupational History   Occupation: retired  Tobacco Use   Smoking status: Former    Packs/day: 1.00    Years: 30.00    Total pack years: 30.00    Types: Cigarettes    Quit date: 03/05/1984    Years since  quitting: 37.7   Smokeless tobacco: Current    Types: Chew   Tobacco comments:    Chews tobacco daily.  Vaping Use   Vaping Use: Never used  Substance and Sexual Activity   Alcohol use: No    Alcohol/week: 0.0 standard drinks of alcohol   Drug use: No   Sexual activity: Not Currently  Other Topics Concern   Not on file  Social History Narrative   Lives with Zigmund Daniel, wife, and son [place].   Social Determinants of Health   Financial Resource Strain: Low Risk  (04/07/2020)   Overall Financial Resource Strain (CARDIA)    Difficulty of Paying Living Expenses: Not hard at all  Food Insecurity: No Food Insecurity (04/07/2020)   Hunger Vital Sign    Worried About Running Out of Food in the Last Year: Never true    Ran Out of Food in the Last Year: Never true  Transportation Needs: No Transportation Needs (04/07/2020)   PRAPARE - Hydrologist (Medical): No    Lack of Transportation (Non-Medical): No  Physical Activity: Inactive (04/07/2020)   Exercise Vital Sign    Days of Exercise per Week: 0 days    Minutes of Exercise  per Session: 0 min  Stress: No Stress Concern Present (04/07/2020)   [place]    Feeling of Stress : Not at all  Social Connections: Moderately Integrated (04/07/2020)   Social Connection and Isolation Panel [NHANES]    Frequency of Communication with Friends and Family: More than three times a week    Frequency of Social Gatherings with Friends and Family: More than three times a week    Attends Religious Services: More than 4 times per year    Active Member of [employer] or Organizations: No    Attends Archivist Meetings: Never    Marital Status: Married  Human resources officer Violence: Not At Risk (04/07/2020)   Humiliation, Afraid, Rape, and Kick questionnaire    Fear of Current or Ex-Partner: No    Emotionally Abused: No    Physically Abused: No    Sexually Abused: No     Family History  Problem Relation Age of Onset   Pulmonary embolism Mother    Transient ischemic attack Mother    Diabetes Mother    Pancreatic cancer Father    Hypertension Father    Diabetes Father    Cirrhosis Brother      Current Outpatient Medications:    aspirin EC 81 MG tablet, Take 1 tablet (81 mg total) by mouth daily. Swallow whole., Disp: 30 tablet, Rfl: 12   clopidogrel (PLAVIX) 75 MG tablet, Take 75 mg by mouth daily., Disp: , Rfl:    Krill Oil 1000 MG CAPS, Take 1,000 mg by mouth daily. , Disp: , Rfl:    metFORMIN (GLUCOPHAGE-XR) 750 MG 24 hr tablet, TAKE 1 TABLET BY MOUTH ONCE A DAY WITH BREAKFAST, Disp: 90 tablet, Rfl: 1   metoprolol tartrate (LOPRESSOR) 25 MG tablet, Take 1 tablet (25 mg total) by mouth 2 (two) times daily., Disp: 60 tablet, Rfl: 2   MULTIPLE VITAMIN PO, Take 1 tablet by mouth daily., Disp: , Rfl:    pantoprazole (PROTONIX) 40 MG tablet, Take 40 mg by mouth daily., Disp: , Rfl:    Pumpkin Seed-Soy Germ (AZO BLADDER CONTROL/GO-LESS PO), Take by mouth., Disp: , Rfl:    ramipril (ALTACE) 10 MG capsule, Take 1 capsule (10 mg total) by mouth daily., Disp: 30 capsule, Rfl: 2   ramipril (ALTACE) 10 MG capsule, Take 10 mg by mouth daily., Disp: , Rfl:    sucralfate (CARAFATE) 1 g tablet, Take 1 tablet (1 g total) by mouth 4 (four) times daily as needed (reflux)., Disp: 60 tablet, Rfl: 0   amLODipine (NORVASC) 5 MG tablet, Take 1 tablet (5 mg total) by mouth daily., Disp: 90 tablet, Rfl: 3   atorvastatin (LIPITOR) 80 MG tablet, TAKE 1 TABLET BY MOUTH EVERY NIGHT AT BEDTIME (Patient not taking: Reported on 11/21/2021), Disp: 90 tablet, Rfl: 3   co-enzyme Q-10 30 MG capsule, Take 30 mg by mouth daily.  (Patient not taking: Reported on 11/21/2021), Disp: , Rfl:    diclofenac sodium (VOLTAREN) 1 % GEL, Apply 2 g topically 4 (four) times daily. (Patient not taking: Reported on 11/21/2021), Disp: 100 g, Rfl: 3   docusate sodium (COLACE) 100 MG capsule, Take 1 capsule  (100 mg total) by mouth 2 (two) times daily., Disp: 60 capsule, Rfl: 0   OneTouch Delica Lancets 29B MISC, TEST FASTING GLUCOSE LEVEL EACH MORNING BEFORE BREAKFAST, Disp: 100 each, Rfl: 4   oxyCODONE (OXY IR/ROXICODONE) 5 MG immediate release tablet, Take 1 tablet (5 mg total) by  mouth every 6 (six) hours as needed for severe pain. (Patient not taking: Reported on 11/08/2021), Disp: 30 tablet, Rfl: 0   pantoprazole (PROTONIX) 20 MG tablet, Take 2 tablets (40 mg total) by mouth daily. (Patient not taking: Reported on 11/08/2021), Disp: 30 tablet, Rfl: 2   potassium chloride SA (KLOR-CON M) 20 MEQ tablet, Take 1 tablet (20 mEq total) by mouth daily. (Patient not taking: Reported on 11/21/2021), Disp: 7 tablet, Rfl: 0   tamsulosin (FLOMAX) 0.4 MG CAPS capsule, Take 0.4 mg by mouth daily. (Patient not taking: Reported on 11/21/2021), Disp: , Rfl:   Physical exam:  Vitals:   11/21/21 1028  BP: (!) 91/53  Pulse: 96  Resp: 16  Temp: (!) 97.3 F (36.3 C)  SpO2: 100%  Weight: 160 lb 14.4 oz (73 kg)   Physical Exam Constitutional:      Comments: Ambulates with a walker.  Appears in no acute distress  Cardiovascular:     Rate and Rhythm: Normal rate and regular rhythm.     Heart sounds: Normal heart sounds.  Pulmonary:     Effort: Pulmonary effort is normal.     Breath sounds: Normal breath sounds.  Abdominal:     General: Bowel sounds are normal.     Palpations: Abdomen is soft.  Skin:    General: Skin is warm and dry.  Neurological:     Mental Status: He is alert and oriented to person, place, and time.         Latest Ref Rng & Units 11/21/2021   10:06 AM  CMP  Glucose 70 - 99 mg/dL 127   BUN 8 - 23 mg/dL 21   Creatinine 0.61 - 1.24 mg/dL 1.19   Sodium 135 - 145 mmol/L 134   Potassium 3.5 - 5.1 mmol/L 3.6   Chloride 98 - 111 mmol/L 106   CO2 22 - 32 mmol/L 22   Calcium 8.9 - 10.3 mg/dL 8.4   Total Protein 6.5 - 8.1 g/dL 6.5   Total Bilirubin 0.3 - 1.2 mg/dL 0.8   Alkaline Phos 38 -  126 U/L 66   AST 15 - 41 U/L 24   ALT 0 - 44 U/L 23       Latest Ref Rng & Units 11/21/2021   10:06 AM  CBC  WBC 4.0 - 10.5 K/uL 1.0   Hemoglobin 13.0 - 17.0 g/dL 8.8   Hematocrit 39.0 - 52.0 % 25.9   Platelets 150 - 400 K/uL 202     No images are attached to the encounter.  DG Chest 2 View  Result Date: 10/30/2021 CLINICAL DATA:  Chest pain on and off for several days EXAM: CHEST - 2 VIEW COMPARISON:  10/15/2021 FINDINGS: Unchanged right upper lobe opacity, a mass by CT. Porta catheter on the right with tip at the upper cavoatrial junction. Mild interstitial coarsening. There is no edema, consolidation, effusion, or pneumothorax. Normal heart size and mediastinal contours. Coronary stenting. IMPRESSION: No active cardiopulmonary disease. Electronically Signed   By: Jorje Guild M.D.   On: 10/30/2021 12:11     Assessment and plan- Patient is a 76 y.o. 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  He is here for on treatment assessment prior to cycle 7 of weekly CarboTaxol chemotherapy  Patient is completing radiation treatment this week and today was supposed to be his cycle 7 of weekly CarboTaxol chemotherapy.  However his white cell count is low at 1  with an ANC of 0.6.  Hemoglobin is further lower at 8.8 today.  I am therefore holding off on giving him chemotherapy today.  He will complete his radiation treatment I will repeat a CT chest abdomen and pelvis with contrast in about 2 weeks time and see him thereafter with repeat labs  Chemo-induced anemia continue to monitor   Visit Diagnosis 1. Chemotherapy induced neutropenia (HCC)   2. Malignant neoplasm of upper lobe of right lung ([place])   3. Antineoplastic chemotherapy induced anemia      Dr. Randa Evens, MD, MPH [facility] [phone] 11/21/2021 1:02 PM

## 2021-11-22 ENCOUNTER — Other Ambulatory Visit: Payer: Self-pay

## 2021-11-22 ENCOUNTER — Ambulatory Visit: Payer: Medicare Other

## 2021-11-22 ENCOUNTER — Ambulatory Visit
Admission: RE | Admit: 2021-11-22 | Discharge: 2021-11-22 | Disposition: A | Discharge status: Home / Self Care | Payer: Medicare Other | Source: Ambulatory Visit | Attending: Radiation Oncology | Admitting: Radiation Oncology

## 2021-11-22 ENCOUNTER — Other Ambulatory Visit: Payer: Medicare Other

## 2021-11-22 DIAGNOSIS — Z510 Encounter for antineoplastic radiation therapy: Secondary | ICD-10-CM | POA: Diagnosis not present

## 2021-11-22 DIAGNOSIS — C3411 Malignant neoplasm of upper lobe, right bronchus or lung: Secondary | ICD-10-CM | POA: Diagnosis not present

## 2021-11-22 DIAGNOSIS — D6481 Anemia due to antineoplastic chemotherapy: Secondary | ICD-10-CM | POA: Diagnosis not present

## 2021-11-22 DIAGNOSIS — Z5111 Encounter for antineoplastic chemotherapy: Secondary | ICD-10-CM | POA: Diagnosis not present

## 2021-11-22 DIAGNOSIS — F17220 Nicotine dependence, chewing tobacco, uncomplicated: Secondary | ICD-10-CM | POA: Diagnosis not present

## 2021-11-22 DIAGNOSIS — Z87891 Personal history of nicotine dependence: Secondary | ICD-10-CM | POA: Diagnosis not present

## 2021-11-22 DIAGNOSIS — D701 Agranulocytosis secondary to cancer chemotherapy: Secondary | ICD-10-CM | POA: Diagnosis not present

## 2021-11-22 LAB — RAD ONC ARIA SESSION SUMMARY
Course Elapsed Days: 49
Plan Fractions Treated to Date: 33
Plan Prescribed Dose Per Fraction: 2 Gy
Plan Total Fractions Prescribed: 35
Plan Total Prescribed Dose: 70 Gy
Reference Point Dosage Given to Date: 66 Gy
Reference Point Session Dosage Given: 2 Gy
Session Number: 33

## 2021-11-23 ENCOUNTER — Ambulatory Visit
Admission: RE | Admit: 2021-11-23 | Discharge: 2021-11-23 | Disposition: A | Discharge status: Home / Self Care | Payer: Medicare Other | Source: Ambulatory Visit | Attending: Radiation Oncology | Admitting: Radiation Oncology

## 2021-11-23 ENCOUNTER — Other Ambulatory Visit: Payer: Self-pay

## 2021-11-23 ENCOUNTER — Ambulatory Visit: Payer: Medicare Other

## 2021-11-23 DIAGNOSIS — C3411 Malignant neoplasm of upper lobe, right bronchus or lung: Secondary | ICD-10-CM | POA: Diagnosis not present

## 2021-11-23 DIAGNOSIS — Z5111 Encounter for antineoplastic chemotherapy: Secondary | ICD-10-CM | POA: Diagnosis not present

## 2021-11-23 DIAGNOSIS — Z510 Encounter for antineoplastic radiation therapy: Secondary | ICD-10-CM | POA: Diagnosis not present

## 2021-11-23 DIAGNOSIS — D6481 Anemia due to antineoplastic chemotherapy: Secondary | ICD-10-CM | POA: Diagnosis not present

## 2021-11-23 DIAGNOSIS — D701 Agranulocytosis secondary to cancer chemotherapy: Secondary | ICD-10-CM | POA: Diagnosis not present

## 2021-11-23 DIAGNOSIS — Z87891 Personal history of nicotine dependence: Secondary | ICD-10-CM | POA: Diagnosis not present

## 2021-11-23 DIAGNOSIS — F17220 Nicotine dependence, chewing tobacco, uncomplicated: Secondary | ICD-10-CM | POA: Diagnosis not present

## 2021-11-23 LAB — RAD ONC ARIA SESSION SUMMARY
Course Elapsed Days: 50
Plan Fractions Treated to Date: 34
Plan Prescribed Dose Per Fraction: 2 Gy
Plan Total Fractions Prescribed: 35
Plan Total Prescribed Dose: 70 Gy
Reference Point Dosage Given to Date: 68 Gy
Reference Point Session Dosage Given: 2 Gy
Session Number: 34

## 2021-11-24 ENCOUNTER — Ambulatory Visit
Admission: RE | Admit: 2021-11-24 | Discharge: 2021-11-24 | Disposition: A | Discharge status: Home / Self Care | Payer: Medicare Other | Source: Ambulatory Visit | Attending: Radiation Oncology | Admitting: Radiation Oncology

## 2021-11-24 ENCOUNTER — Other Ambulatory Visit: Payer: Self-pay

## 2021-11-24 DIAGNOSIS — Z510 Encounter for antineoplastic radiation therapy: Secondary | ICD-10-CM | POA: Diagnosis not present

## 2021-11-24 DIAGNOSIS — C3411 Malignant neoplasm of upper lobe, right bronchus or lung: Secondary | ICD-10-CM | POA: Diagnosis not present

## 2021-11-24 DIAGNOSIS — Z5111 Encounter for antineoplastic chemotherapy: Secondary | ICD-10-CM | POA: Diagnosis not present

## 2021-11-24 DIAGNOSIS — Z87891 Personal history of nicotine dependence: Secondary | ICD-10-CM | POA: Diagnosis not present

## 2021-11-24 DIAGNOSIS — D701 Agranulocytosis secondary to cancer chemotherapy: Secondary | ICD-10-CM | POA: Diagnosis not present

## 2021-11-24 DIAGNOSIS — F17220 Nicotine dependence, chewing tobacco, uncomplicated: Secondary | ICD-10-CM | POA: Diagnosis not present

## 2021-11-24 DIAGNOSIS — D6481 Anemia due to antineoplastic chemotherapy: Secondary | ICD-10-CM | POA: Diagnosis not present

## 2021-11-24 LAB — RAD ONC ARIA SESSION SUMMARY
Course Elapsed Days: 51
Plan Fractions Treated to Date: 35
Plan Prescribed Dose Per Fraction: 2 Gy
Plan Total Fractions Prescribed: 35
Plan Total Prescribed Dose: 70 Gy
Reference Point Dosage Given to Date: 70 Gy
Reference Point Session Dosage Given: 2 Gy
Session Number: 35

## 2021-11-27 ENCOUNTER — Other Ambulatory Visit: Payer: Self-pay | Admitting: *Deleted

## 2021-11-27 MED ORDER — OXYCODONE HCL 5 MG PO TABS: 5.0000 mg | ORAL_TABLET | Freq: Four times a day (QID) | ORAL | 0 refills | Status: DC | PRN

## 2021-11-27 NOTE — Telephone Encounter (Signed)
Pt is having back pain and requests refill of oxycodone. Per Dr. Janese Banks, will refill this one time but future refills will need to be requested from pt's PCP. Pt's wife made aware.

## 2021-12-05 ENCOUNTER — Ambulatory Visit
Admission: RE | Admit: 2021-12-05 | Discharge: 2021-12-05 | Disposition: A | Discharge status: Home / Self Care | Payer: Medicare Other | Source: Ambulatory Visit | Attending: Oncology | Admitting: Oncology

## 2021-12-05 DIAGNOSIS — K5730 Diverticulosis of large intestine without perforation or abscess without bleeding: Secondary | ICD-10-CM | POA: Diagnosis not present

## 2021-12-05 DIAGNOSIS — J439 Emphysema, unspecified: Secondary | ICD-10-CM | POA: Diagnosis not present

## 2021-12-05 DIAGNOSIS — K429 Umbilical hernia without obstruction or gangrene: Secondary | ICD-10-CM | POA: Diagnosis not present

## 2021-12-05 DIAGNOSIS — K529 Noninfective gastroenteritis and colitis, unspecified: Secondary | ICD-10-CM | POA: Diagnosis not present

## 2021-12-05 DIAGNOSIS — C3411 Malignant neoplasm of upper lobe, right bronchus or lung: Secondary | ICD-10-CM | POA: Diagnosis not present

## 2021-12-05 DIAGNOSIS — I700 Atherosclerosis of aorta: Secondary | ICD-10-CM | POA: Diagnosis not present

## 2021-12-05 DIAGNOSIS — K5792 Diverticulitis of intestine, part unspecified, without perforation or abscess without bleeding: Secondary | ICD-10-CM | POA: Diagnosis not present

## 2021-12-05 DIAGNOSIS — R918 Other nonspecific abnormal finding of lung field: Secondary | ICD-10-CM | POA: Diagnosis not present

## 2021-12-05 DIAGNOSIS — C3490 Malignant neoplasm of unspecified part of unspecified bronchus or lung: Secondary | ICD-10-CM | POA: Diagnosis not present

## 2021-12-05 MED ORDER — IOHEXOL 300 MG/ML  SOLN
100.0000 mL | Freq: Once | INTRAMUSCULAR | Status: AC | PRN
  Administered 2021-12-05: 100 mL via INTRAVENOUS

## 2021-12-05 MED ORDER — HEPARIN SOD (PORK) LOCK FLUSH 100 UNIT/ML IV SOLN
INTRAVENOUS | Status: AC
  Filled 2021-12-05: qty 5

## 2021-12-06 ENCOUNTER — Other Ambulatory Visit: Payer: Self-pay | Admitting: *Deleted

## 2021-12-06 ENCOUNTER — Telehealth: Payer: Self-pay | Admitting: *Deleted

## 2021-12-06 NOTE — Telephone Encounter (Signed)
Called report  IMPRESSION: 1. Diminishing size of spiculated RIGHT upper lobe mass compatible with response to therapy. No new or progressive oncologic findings. 2. Interval development of moderate biliary duct distension. Transition in the pancreatic portion of the common bile duct with suspected choledocholithiasis. Given peribiliary enhancement would suggest correlation with any symptoms that would suggest cholangitis. MRCP may be helpful for further evaluation. 3. Loss of normal pancreatic parenchymal architecture in the head and neck of the pancreas is of uncertain significance potentially related to prior pancreatitis. Mild acute superimposed pancreatitis is also considered. MRCP may also be helpful to exclude any underlying occult pancreatic lesion and assess parenchymal signal. There is no peripheral ductal dilation. Correlate with pancreatic enzymes. 4. Query mild jejunal enteritis. Signs of diverticulitis/colitis have largely resolved since previous imaging. Correlate with any abdominal symptoms and with any risk factors related to ongoing therapy for enteritis. 5. Small volume pelvic fluid, simple. Fluid may be related to above findings 6. Prostatomegaly with heterogeneity. PSA correlation may be helpful. Appearance is unchanged. 7. Signs of pulmonary emphysema. 8. Signs of percutaneous coronary intervention and three-vessel coronary artery disease. 9. Aortic atherosclerosis.   Aortic Atherosclerosis (ICD10-I70.0) and Emphysema (ICD10-J43.9).     Electronically Signed   By: Zetta Bills M.D.   On: 12/06/2021 14:30

## 2021-12-07 ENCOUNTER — Other Ambulatory Visit: Payer: Self-pay | Admitting: *Deleted

## 2021-12-07 ENCOUNTER — Other Ambulatory Visit: Payer: Self-pay

## 2021-12-07 ENCOUNTER — Telehealth: Payer: Self-pay | Admitting: *Deleted

## 2021-12-07 DIAGNOSIS — R935 Abnormal findings on diagnostic imaging of other abdominal regions, including retroperitoneum: Secondary | ICD-10-CM

## 2021-12-07 DIAGNOSIS — C3411 Malignant neoplasm of upper lobe, right bronchus or lung: Secondary | ICD-10-CM

## 2021-12-07 DIAGNOSIS — Z711 Person with feared health complaint in whom no diagnosis is made: Secondary | ICD-10-CM

## 2021-12-07 NOTE — Telephone Encounter (Signed)
I called the home phone and wife answered and I told her I know they are coming over tom. For appt with Janese Banks and I had spoke to pt . Yest about the scan showed possible gallstones and pancreas is inflamed and Dr Janese Banks suggest to get MRI of the abdomen to see what may be going on. He wanted to have it checked out so he said get him appt and let him know about.when I called today the wife answered and she said they are coming tom. And they can get the appt and instructions at the visit.

## 2021-12-08 ENCOUNTER — Encounter: Payer: Self-pay | Admitting: Oncology

## 2021-12-08 ENCOUNTER — Inpatient Hospital Stay: Payer: Medicare Other | Attending: Oncology

## 2021-12-08 ENCOUNTER — Other Ambulatory Visit: Payer: Self-pay | Admitting: Oncology

## 2021-12-08 ENCOUNTER — Inpatient Hospital Stay (HOSPITAL_BASED_OUTPATIENT_CLINIC_OR_DEPARTMENT_OTHER): Payer: Medicare Other | Admitting: Oncology

## 2021-12-08 VITALS — BP 110/67 | HR 97 | Temp 98.9°F | Ht 65.0 in | Wt 160.0 lb

## 2021-12-08 DIAGNOSIS — R9389 Abnormal findings on diagnostic imaging of other specified body structures: Secondary | ICD-10-CM | POA: Diagnosis not present

## 2021-12-08 DIAGNOSIS — C3411 Malignant neoplasm of upper lobe, right bronchus or lung: Secondary | ICD-10-CM

## 2021-12-08 LAB — CBC WITH DIFFERENTIAL/PLATELET
Abs Immature Granulocytes: 0.01 10*3/uL (ref 0.00–0.07)
Basophils Absolute: 0 10*3/uL (ref 0.0–0.1)
Basophils Relative: 1 %
Eosinophils Absolute: 0.6 10*3/uL — ABNORMAL HIGH (ref 0.0–0.5)
Eosinophils Relative: 11 %
HCT: 28.8 % — ABNORMAL LOW (ref 39.0–52.0)
Hemoglobin: 9.2 g/dL — ABNORMAL LOW (ref 13.0–17.0)
Immature Granulocytes: 0 %
Lymphocytes Relative: 10 %
Lymphs Abs: 0.5 10*3/uL — ABNORMAL LOW (ref 0.7–4.0)
MCH: 28.5 pg (ref 26.0–34.0)
MCHC: 31.9 g/dL (ref 30.0–36.0)
MCV: 89.2 fL (ref 80.0–100.0)
Monocytes Absolute: 0.8 10*3/uL (ref 0.1–1.0)
Monocytes Relative: 15 %
Neutro Abs: 3.4 10*3/uL (ref 1.7–7.7)
Neutrophils Relative %: 63 %
Platelets: 169 10*3/uL (ref 150–400)
RBC: 3.23 MIL/uL — ABNORMAL LOW (ref 4.22–5.81)
RDW: 21.4 % — ABNORMAL HIGH (ref 11.5–15.5)
WBC: 5.4 10*3/uL (ref 4.0–10.5)
nRBC: 0 % (ref 0.0–0.2)

## 2021-12-08 LAB — COMPREHENSIVE METABOLIC PANEL
ALT: 29 U/L (ref 0–44)
AST: 32 U/L (ref 15–41)
Albumin: 3.2 g/dL — ABNORMAL LOW (ref 3.5–5.0)
Alkaline Phosphatase: 76 U/L (ref 38–126)
Anion gap: 5 (ref 5–15)
BUN: 16 mg/dL (ref 8–23)
CO2: 22 mmol/L (ref 22–32)
Calcium: 8.3 mg/dL — ABNORMAL LOW (ref 8.9–10.3)
Chloride: 106 mmol/L (ref 98–111)
Creatinine, Ser: 1.17 mg/dL (ref 0.61–1.24)
GFR, Estimated: >60 mL/min (ref >60)
Glucose, Bld: 120 mg/dL — ABNORMAL HIGH (ref 70–99)
Potassium: 3.6 mmol/L (ref 3.5–5.1)
Sodium: 133 mmol/L — ABNORMAL LOW (ref 135–145)
Total Bilirubin: 0.5 mg/dL (ref 0.3–1.2)
Total Protein: 6.5 g/dL (ref 6.5–8.1)

## 2021-12-08 MED ORDER — DOXYCYCLINE HYCLATE 100 MG PO TABS: 100.0000 mg | ORAL_TABLET | Freq: Two times a day (BID) | ORAL | 0 refills | Status: DC

## 2021-12-08 NOTE — Progress Notes (Signed)
Patient here for follow up. Patient complains of runny nose for the past month. Patient denies any cough, fever, vomiting, diarrhea. Patient states he has SOB at times too.

## 2021-12-08 NOTE — Progress Notes (Signed)
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  He quit smoking about 30 years ago but still chews tobacco.  He presented to the ER with symptoms of neck pain which led to an MRI cervical spine as well as CT angio chest.  MRI cervical spine showed degenerative changes along with moderate spinal stenosis and mass effect between C3-C4 and C4-C5.  Foraminal stenosis at C3-C4 C5-C6-C7 nerve levels.  Nonspecific 10 mm T3 vertebral body lesion indeterminate for bone metastases.  CT angio chest showed a 4.5 x 4.9 x 3.7 cm right upper lobe peripheral mass with probable pleural involvement and potential early invasion into right middle lobe.  Borderline enlarged right hilar lymph nodes measuring up to 1.2 cm.   PET CT scan showed hypermetabolic 4.8 cm right upper lobe mass consistent with primary bronchogenic carcinoma and mild asymmetric FDG activity in the right suprahilar region suspicious for right hilar lymph node metastases.  Patient had a CT-guided right upper lobe lung biopsy which was consistent with squamous cell carcinoma.    Interval history-patient reports ongoing fatigue.  He also has a painful area on his back which makes it difficult for him to sit against a chair or wall.  Denies any  abdominal pain  ECOG PS- 2 Pain scale- 6  Review of systems- Review of Systems  Constitutional:  Positive for malaise/fatigue. Negative for chills, fever and weight loss.  HENT:  Negative for congestion, ear discharge and nosebleeds.   Eyes:  Negative for blurred vision.  Respiratory:  Negative for cough, hemoptysis, sputum production, shortness of breath and wheezing.   Cardiovascular:  Negative for chest pain, palpitations, orthopnea and claudication.  Gastrointestinal:  Negative for abdominal pain, blood in stool, constipation, diarrhea, heartburn, melena, nausea and vomiting.  Genitourinary:  Negative for dysuria, flank pain, frequency, hematuria and urgency.  Musculoskeletal:  Negative for back pain, joint pain and myalgias.  Skin:  Negative for rash.  Neurological:  Negative for dizziness, tingling, focal weakness, seizures, weakness and headaches.  Endo/Heme/Allergies:  Does not bruise/bleed easily.  Psychiatric/Behavioral:  Negative for depression and suicidal ideas. The patient does not have insomnia.       Allergies  Allergen Reactions   Codeine Nausea Only, Nausea And Vomiting and Other (See Comments)    Other reaction(s): Vomiting     Past Medical History:  Diagnosis Date   Arthritis    OSTEOARTHRITIS   Chronic kidney disease    kidney stones   COPD (chronic obstructive pulmonary disease) (HCC)    Coronary artery disease    2 stents    Diabetes mellitus without complication (HCC)    GERD (gastroesophageal reflux disease)    Hypercholesteremia    Hypertension    Lung cancer ([place])    Myocardial infarction [facility]) [id number]   Renal colic      Past Surgical History:  Procedure Laterality  Date   CARDIAC CATHETERIZATION  2013   2 stents   CAROTID PTA/STENT INTERVENTION Right 09/11/2021   Procedure: CAROTID PTA/STENT INTERVENTION;  Surgeon: Algernon Huxley, MD;  Location: [place] [facility];  Service: Cardiovascular;  Laterality: Right;   CATARACT EXTRACTION,  BILATERAL     COLONOSCOPY  04/27/05   COLONOSCOPY WITH PROPOFOL N/A 06/08/2015   Procedure: COLONOSCOPY WITH PROPOFOL;  Surgeon: Robert Bellow, MD;  Location: [facility];  Service: Endoscopy;  Laterality: N/A;   CYSTOSCOPY WITH INSERTION OF UROLIFT N/A 02/11/2020   Procedure: CYSTOSCOPY WITH INSERTION OF UROLIFT;  Surgeon: Royston Cowper, MD;  Location: [facility];  Service: Urology;  Laterality: N/A;   CYSTOSCOPY WITH STENT PLACEMENT Right 09/16/2016   Procedure: CYSTOSCOPY WITH STENT PLACEMENT;  Surgeon: Royston Cowper, MD;  Location: [facility];  Service: Urology;  Laterality: Right;   EXTRACORPOREAL SHOCK WAVE LITHOTRIPSY Left 11/04/2014   has had 2 previous lithotripsies   EXTRACORPOREAL SHOCK WAVE LITHOTRIPSY Left 03/24/2015   Procedure: EXTRACORPOREAL SHOCK WAVE LITHOTRIPSY (ESWL);  Surgeon: Royston Cowper, MD;  Location: [facility];  Service: Urology;  Laterality: Left;   EXTRACORPOREAL SHOCK WAVE LITHOTRIPSY Right 10/04/2016   Procedure: EXTRACORPOREAL SHOCK WAVE LITHOTRIPSY (ESWL);  Surgeon: Royston Cowper, MD;  Location: [facility];  Service: Urology;  Laterality: Right;   LEFT HEART CATH AND CORONARY ANGIOGRAPHY N/A 12/05/2018   Procedure: LEFT HEART CATH AND CORONARY ANGIOGRAPHY with possible pci and stent;  Surgeon: Yolonda Kida, MD;  Location: [place] [facility];  Service: Cardiovascular;  Laterality: N/A;   PORTA CATH INSERTION Right 09/25/2021   Procedure: PORTA CATH INSERTION;  Surgeon: Algernon Huxley, MD;  Location: [place] [facility];  Service: Cardiovascular;  Laterality: Right;    Social History   Socioeconomic History   Marital status: Married    Spouse name: Not on file   Number of children: 2   Years of education: Not on file   Highest education level: 8th grade  Occupational History   Occupation: retired  Tobacco Use   Smoking status: Former    Packs/day: 1.00    Years: 30.00    Total pack years: 30.00    Types: Cigarettes    Quit date: 03/05/1984     Years since quitting: 37.7   Smokeless tobacco: Current    Types: Chew   Tobacco comments:    Chews tobacco daily.  Vaping Use   Vaping Use: Never used  Substance and Sexual Activity   Alcohol use: No    Alcohol/week: 0.0 standard drinks of alcohol   Drug use: No   Sexual activity: Not Currently  Other Topics Concern   Not on file  Social History Narrative   Lives with Zigmund Daniel, wife, and son [place].   Social Determinants of Health   Financial Resource Strain: Low Risk  (04/07/2020)   Overall Financial Resource Strain (CARDIA)    Difficulty of Paying Living Expenses: Not hard at all  Food Insecurity: No Food Insecurity (04/07/2020)   Hunger Vital Sign    Worried About Running Out of Food in the Last Year: Never true    Ran Out of Food in the Last Year: Never true  Transportation Needs: No Transportation Needs (04/07/2020)   PRAPARE - Hydrologist (Medical): No    Lack of Transportation (Non-Medical): No  Physical Activity: Inactive (04/07/2020)   Exercise Vital Sign    Days of Exercise per Week: 0 days  Minutes of Exercise per Session: 0 min  Stress: No Stress Concern Present (04/07/2020)   [place]    Feeling of Stress : Not at all  Social Connections: Moderately Integrated (04/07/2020)   Social Connection and Isolation Panel [NHANES]    Frequency of Communication with Friends and Family: More than three times a week    Frequency of Social Gatherings with Friends and Family: More than three times a week    Attends Religious Services: More than 4 times per year    Active Member of [employer] or Organizations: No    Attends Archivist Meetings: Never    Marital Status: Married  Human resources officer Violence: Not At Risk (04/07/2020)   Humiliation, Afraid, Rape, and Kick questionnaire    Fear of Current or Ex-Partner: No    Emotionally Abused: No    Physically Abused: No    Sexually Abused:  No    Family History  Problem Relation Age of Onset   Pulmonary embolism Mother    Transient ischemic attack Mother    Diabetes Mother    Pancreatic cancer Father    Hypertension Father    Diabetes Father    Cirrhosis Brother      Current Outpatient Medications:    amLODipine (NORVASC) 5 MG tablet, Take 1 tablet (5 mg total) by mouth daily., Disp: 90 tablet, Rfl: 3   aspirin EC 81 MG tablet, Take 1 tablet (81 mg total) by mouth daily. Swallow whole., Disp: 30 tablet, Rfl: 12   clopidogrel (PLAVIX) 75 MG tablet, Take 75 mg by mouth daily., Disp: , Rfl:    docusate sodium (COLACE) 100 MG capsule, Take 1 capsule (100 mg total) by mouth 2 (two) times daily., Disp: 60 capsule, Rfl: 0   doxycycline (VIBRA-TABS) 100 MG tablet, Take 1 tablet (100 mg total) by mouth 2 (two) times daily., Disp: 14 tablet, Rfl: 0   Krill Oil 1000 MG CAPS, Take 1,000 mg by mouth daily. , Disp: , Rfl:    metFORMIN (GLUCOPHAGE-XR) 750 MG 24 hr tablet, TAKE 1 TABLET BY MOUTH ONCE A DAY WITH BREAKFAST, Disp: 90 tablet, Rfl: 1   metoprolol tartrate (LOPRESSOR) 25 MG tablet, Take 1 tablet (25 mg total) by mouth 2 (two) times daily., Disp: 60 tablet, Rfl: 2   MULTIPLE VITAMIN PO, Take 1 tablet by mouth daily., Disp: , Rfl:    OneTouch Delica Lancets 59D MISC, TEST FASTING GLUCOSE LEVEL EACH MORNING BEFORE BREAKFAST, Disp: 100 each, Rfl: 4   oxyCODONE (OXY IR/ROXICODONE) 5 MG immediate release tablet, Take 1 tablet (5 mg total) by mouth every 6 (six) hours as needed for severe pain., Disp: 30 tablet, Rfl: 0   pantoprazole (PROTONIX) 40 MG tablet, Take 40 mg by mouth daily., Disp: , Rfl:    Pumpkin Seed-Soy Germ (AZO BLADDER CONTROL/GO-LESS PO), Take by mouth., Disp: , Rfl:    ramipril (ALTACE) 10 MG capsule, Take 1 capsule (10 mg total) by mouth daily., Disp: 30 capsule, Rfl: 2   ramipril (ALTACE) 10 MG capsule, Take 10 mg by mouth daily., Disp: , Rfl:    sucralfate (CARAFATE) 1 g tablet, Take 1 tablet (1 g total) by  mouth 4 (four) times daily as needed (reflux)., Disp: 60 tablet, Rfl: 0   atorvastatin (LIPITOR) 80 MG tablet, TAKE 1 TABLET BY MOUTH EVERY NIGHT AT BEDTIME (Patient not taking: Reported on 11/21/2021), Disp: 90 tablet, Rfl: 3   co-enzyme Q-10 30 MG capsule, Take 30  mg by mouth daily.  (Patient not taking: Reported on 11/21/2021), Disp: , Rfl:    diclofenac sodium (VOLTAREN) 1 % GEL, Apply 2 g topically 4 (four) times daily. (Patient not taking: Reported on 11/21/2021), Disp: 100 g, Rfl: 3   pantoprazole (PROTONIX) 20 MG tablet, Take 2 tablets (40 mg total) by mouth daily. (Patient not taking: Reported on 11/08/2021), Disp: 30 tablet, Rfl: 2   potassium chloride SA (KLOR-CON M) 20 MEQ tablet, Take 1 tablet (20 mEq total) by mouth daily. (Patient not taking: Reported on 11/21/2021), Disp: 7 tablet, Rfl: 0   tamsulosin (FLOMAX) 0.4 MG CAPS capsule, Take 0.4 mg by mouth daily. (Patient not taking: Reported on 11/21/2021), Disp: , Rfl:   Physical exam:  Vitals:   12/08/21 0908  BP: 110/67  Pulse: 97  Temp: 98.9 F (37.2 C)  TempSrc: Tympanic  SpO2: 100%  Weight: 160 lb (72.6 kg)  Height: 5\' 5"  (1.651 m)   Physical Exam Constitutional:      General: He is not in acute distress.    Comments: Ambulates with a walker  Cardiovascular:     Rate and Rhythm: Normal rate and regular rhythm.     Heart sounds: Normal heart sounds.  Pulmonary:     Effort: Pulmonary effort is normal.     Breath sounds: Normal breath sounds.  Abdominal:     General: Bowel sounds are normal.     Palpations: Abdomen is soft.  Musculoskeletal:     Comments: We will therefore chronic lipoma noted over the back with superimposed area of erythema in the center  Skin:    General: Skin is warm and dry.  Neurological:     Mental Status: He is alert and oriented to person, place, and time.         Latest Ref Rng & Units 12/08/2021    8:53 AM  CMP  Glucose 70 - 99 mg/dL 120   BUN 8 - 23 mg/dL 16   Creatinine 0.61 - 1.24  mg/dL 1.17   Sodium 135 - 145 mmol/L 133   Potassium 3.5 - 5.1 mmol/L 3.6   Chloride 98 - 111 mmol/L 106   CO2 22 - 32 mmol/L 22   Calcium 8.9 - 10.3 mg/dL 8.3   Total Protein 6.5 - 8.1 g/dL 6.5   Total Bilirubin 0.3 - 1.2 mg/dL 0.5   Alkaline Phos 38 - 126 U/L 76   AST 15 - 41 U/L 32   ALT 0 - 44 U/L 29       Latest Ref Rng & Units 12/08/2021    8:53 AM  CBC  WBC 4.0 - 10.5 K/uL 5.4   Hemoglobin 13.0 - 17.0 g/dL 9.2   Hematocrit 39.0 - 52.0 % 28.8   Platelets 150 - 400 K/uL 169     No images are attached to the encounter.  CT CHEST ABDOMEN PELVIS W CONTRAST  Result Date: 12/06/2021 CLINICAL DATA:  Lung cancer follow-up. * Tracking Code: BO * EXAM: CT CHEST, ABDOMEN, AND PELVIS WITH CONTRAST TECHNIQUE: Multidetector CT imaging of the chest, abdomen and pelvis was performed following the standard protocol during bolus administration of intravenous contrast. RADIATION DOSE REDUCTION: This exam was performed according to the departmental dose-optimization program which includes automated exposure control, adjustment of the mA and/or kV according to patient size and/or use of iterative reconstruction technique. CONTRAST:  162mL OMNIPAQUE IOHEXOL 300 MG/ML  SOLN COMPARISON:  October 15, 2021 FINDINGS: CT CHEST FINDINGS Cardiovascular: RIGHT-sided Port-A-Cath terminating at  the caval to atrial junction. Signs of percutaneous coronary intervention and three-vessel coronary artery disease. Heart size normal without pericardial effusion or nodularity. Normal caliber of central pulmonary vessels. Normal caliber of the thoracic aorta. Mediastinum/Nodes: No thoracic inlet, axillary, mediastinal or hilar adenopathy. Esophagus grossly normal. Lungs/Pleura: Signs of pulmonary emphysema. Diminishing size of spiculated RIGHT upper lobe mass 3.4 x 2.9 cm as compared to 4.5 x 4.2 cm on the study of October 15, 2021. Mass contacts the pleural surface along the lateral RIGHT chest abutting the minor fissure as  well. Subpleural reticulation similar to prior imaging. No new, suspicious pulmonary nodule. Musculoskeletal: See below for full musculoskeletal details. No chest wall masses. CT ABDOMEN PELVIS FINDINGS Hepatobiliary: Gallbladder collapsed moderate. Interval development of mild biliary duct distension. Mild peribiliary enhancement. No discrete lesion. Mild loss of parenchymal architecture in the pancreatic head. Hyperdense area in the region of the distal common bile duct (image 57/5) this measures 5 mm. No focal, suspicious hepatic lesion.  Portal vein is patent. Pancreas: Mild loss of normal pancreatic parenchymal architecture in the head and neck of the pancreas. No peripheral pancreatic ductal dilation. No peripancreatic fluid. Spleen: Normal. Adrenals/Urinary Tract: Adrenal glands are normal. Symmetric renal enhancement without hydronephrosis. No suspicious renal lesion with large RIGHT renal cyst measuring 6.7 cm greatest dimension. Urinary bladder is collapsed limiting assessment. Stomach/Bowel: Normal appendix. Mild perienteric stranding in the LEFT upper quadrant about the jejunum is of uncertain significance. No signs of bowel obstruction. No pneumatosis. No adjacent fluid. Colonic diverticulosis and diverticular changes. Signs of colitis and or diverticulitis seen on previous imaging have resolved Vascular/Lymphatic: Aortic atherosclerosis. No sign of aneurysm. Smooth contour of the IVC. There is no gastrohepatic or hepatoduodenal ligament lymphadenopathy. No retroperitoneal or mesenteric lymphadenopathy. No pelvic sidewall lymphadenopathy. Atherosclerotic changes are moderate to marked. Reproductive: Prostatomegaly with heterogeneity. Reproductive structures otherwise unremarkable by CT with nonspecific appearance. Other: Small volume pelvic fluid. No free air. Small fat containing umbilical hernia. Musculoskeletal: No acute bone finding. No destructive bone process. Spinal degenerative changes.  IMPRESSION: 1. Diminishing size of spiculated RIGHT upper lobe mass compatible with response to therapy. No new or progressive oncologic findings. 2. Interval development of moderate biliary duct distension. Transition in the pancreatic portion of the common bile duct with suspected choledocholithiasis. Given peribiliary enhancement would suggest correlation with any symptoms that would suggest cholangitis. MRCP may be helpful for further evaluation. 3. Loss of normal pancreatic parenchymal architecture in the head and neck of the pancreas is of uncertain significance potentially related to prior pancreatitis. Mild acute superimposed pancreatitis is also considered. MRCP may also be helpful to exclude any underlying occult pancreatic lesion and assess parenchymal signal. There is no peripheral ductal dilation. Correlate with pancreatic enzymes. 4. Query mild jejunal enteritis. Signs of diverticulitis/colitis have largely resolved since previous imaging. Correlate with any abdominal symptoms and with any risk factors related to ongoing therapy for enteritis. 5. Small volume pelvic fluid, simple. Fluid may be related to above findings 6. Prostatomegaly with heterogeneity. PSA correlation may be helpful. Appearance is unchanged. 7. Signs of pulmonary emphysema. 8. Signs of percutaneous coronary intervention and three-vessel coronary artery disease. 9. Aortic atherosclerosis. Aortic Atherosclerosis (ICD10-I70.0) and Emphysema (ICD10-J43.9). Electronically Signed   By: Zetta Bills M.D.   On: 12/06/2021 14:30     Assessment and plan- Patient is a 76 y.o. 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  He is s/p 7 cycles of concurrent chemoradiation and  here to discuss CT scan results and Further management  I have reviewed CT chest abdomen images independently and discussed findings with the patient which overall shows reduction in the size of his right upper lobe lung mass from 4.5  cm to 3.4 cm.  No other evidence of progressive disease.  He was noted to have some moderate biliary distention and transition point in the pancreatic portion of the CBD with suspected choledocholithiasis.  There is also some loss of abnormal parenchymal architecture in the head of the pancreas.  Clinically patient denies any abdominal pain and has no signs of cholangitis.  LFTs are normal.  However to follow-up on the choledocholithiasis as well as pancreatic head abnormality I will plan to get MRI with MRCP at this time.  I will call the patient with the results of the MRI.  Patient is quite frail and continues to have some ongoing shortness of breath.  He has completed concurrent chemoradiation and with stage II disease I do not plan to offer him maintenance immunotherapy at this time.  I will continue to observe his lung cancer with Pediotic scans every 3 to 6 months.  I will see him back in 3 months with a repeat CT and he will be seen by NP Altha Harm in 1 month to see how he is coping with his present symptoms  There appears to be an area of folliculitis superimposed on his lipoma.  I am giving him 100 mg of doxycycline twice daily for 7 days     Visit Diagnosis 1. Malignant neoplasm of upper lobe of right lung ([place])   2. Abnormal CT scan      Dr. Randa Evens, MD, MPH [facility] [phone] 12/08/2021 12:37 PM

## 2021-12-11 ENCOUNTER — Encounter: Payer: Self-pay | Admitting: Oncology

## 2021-12-12 DIAGNOSIS — L723 Sebaceous cyst: Secondary | ICD-10-CM | POA: Diagnosis not present

## 2021-12-13 ENCOUNTER — Inpatient Hospital Stay: Payer: Medicare Other

## 2021-12-13 NOTE — Progress Notes (Signed)
Patient scheduled for nutrition follow up.  Attempt to reach patient at both home and cell#. Provided my cell# on voice mail to return call, also sent text with contact information.  April Manson, RDN, LDN Registered Dietitian, [place] Part Time Remote (Usual office hours: Tuesday-Thursday) Cell: [phone]

## 2021-12-15 ENCOUNTER — Ambulatory Visit
Admission: RE | Admit: 2021-12-15 | Discharge: 2021-12-15 | Disposition: A | Discharge status: Home / Self Care | Payer: Medicare Other | Source: Ambulatory Visit | Attending: Oncology | Admitting: Oncology

## 2021-12-15 ENCOUNTER — Other Ambulatory Visit: Payer: Self-pay | Admitting: Oncology

## 2021-12-15 DIAGNOSIS — Z711 Person with feared health complaint in whom no diagnosis is made: Secondary | ICD-10-CM | POA: Diagnosis not present

## 2021-12-15 DIAGNOSIS — K838 Other specified diseases of biliary tract: Secondary | ICD-10-CM | POA: Diagnosis not present

## 2021-12-15 DIAGNOSIS — N281 Cyst of kidney, acquired: Secondary | ICD-10-CM | POA: Diagnosis not present

## 2021-12-15 DIAGNOSIS — I700 Atherosclerosis of aorta: Secondary | ICD-10-CM | POA: Diagnosis not present

## 2021-12-15 DIAGNOSIS — C3411 Malignant neoplasm of upper lobe, right bronchus or lung: Secondary | ICD-10-CM

## 2021-12-15 DIAGNOSIS — R935 Abnormal findings on diagnostic imaging of other abdominal regions, including retroperitoneum: Secondary | ICD-10-CM

## 2021-12-15 DIAGNOSIS — K8020 Calculus of gallbladder without cholecystitis without obstruction: Secondary | ICD-10-CM | POA: Diagnosis not present

## 2021-12-15 MED ORDER — GADOBUTROL 1 MMOL/ML IV SOLN
7.5000 mL | Freq: Once | INTRAVENOUS | Status: AC | PRN
  Administered 2021-12-15: 7.5 mL via INTRAVENOUS

## 2021-12-15 MED ORDER — HEPARIN SOD (PORK) LOCK FLUSH 100 UNIT/ML IV SOLN
INTRAVENOUS | Status: AC
  Administered 2021-12-15: 500 [IU] via INTRAVENOUS
  Filled 2021-12-15: qty 5

## 2021-12-15 MED ORDER — HEPARIN SOD (PORK) LOCK FLUSH 100 UNIT/ML IV SOLN
500.0000 [IU] | Freq: Once | INTRAVENOUS | Status: AC
  Filled 2021-12-15: qty 5

## 2021-12-18 ENCOUNTER — Ambulatory Visit (INDEPENDENT_AMBULATORY_CARE_PROVIDER_SITE_OTHER): Payer: Medicare Other | Admitting: Family Medicine

## 2021-12-18 ENCOUNTER — Other Ambulatory Visit: Payer: Self-pay | Admitting: Family Medicine

## 2021-12-18 ENCOUNTER — Encounter: Payer: Self-pay | Admitting: Family Medicine

## 2021-12-18 ENCOUNTER — Ambulatory Visit: Payer: Self-pay

## 2021-12-18 ENCOUNTER — Ambulatory Visit
Admission: RE | Admit: 2021-12-18 | Discharge: 2021-12-18 | Disposition: A | Discharge status: Home / Self Care | Payer: Medicare Other | Source: Ambulatory Visit | Attending: Family Medicine | Admitting: Family Medicine

## 2021-12-18 ENCOUNTER — Ambulatory Visit
Admission: RE | Admit: 2021-12-18 | Discharge: 2021-12-18 | Disposition: A | Discharge status: Home / Self Care | Payer: Medicare Other | Attending: Family Medicine | Admitting: Family Medicine

## 2021-12-18 VITALS — BP 104/56 | Temp 97.9°F | Resp 16 | Wt 153.7 lb

## 2021-12-18 DIAGNOSIS — J441 Chronic obstructive pulmonary disease with (acute) exacerbation: Secondary | ICD-10-CM

## 2021-12-18 DIAGNOSIS — L723 Sebaceous cyst: Secondary | ICD-10-CM | POA: Diagnosis not present

## 2021-12-18 DIAGNOSIS — R059 Cough, unspecified: Secondary | ICD-10-CM | POA: Diagnosis not present

## 2021-12-18 DIAGNOSIS — R0602 Shortness of breath: Secondary | ICD-10-CM | POA: Diagnosis not present

## 2021-12-18 DIAGNOSIS — R062 Wheezing: Secondary | ICD-10-CM | POA: Diagnosis not present

## 2021-12-18 DIAGNOSIS — R079 Chest pain, unspecified: Secondary | ICD-10-CM | POA: Diagnosis not present

## 2021-12-18 MED ORDER — PREDNISONE 20 MG PO TABS: 40.0000 mg | ORAL_TABLET | Freq: Every day | ORAL | 0 refills | Status: AC

## 2021-12-18 NOTE — Telephone Encounter (Signed)
Chief Complaint: SOB, CP w/coughing Symptoms: cough, stuffy/runny nose Frequency: Ongoing x 1 month or longer Pertinent Negatives: Patient denies fever Disposition: [] ED /[] Urgent Care (no appt availability in office) / [x] Appointment(In office/virtual)/ []  [place] [facility]/ [] Home Care/ [] Refused Recommended Disposition /[] [place] [facility]/ []  Follow-up with PCP Additional Notes: Patient says the SOB is sometimes constant, but he has a hard time using the inhaler because of the coughing he has when taking in the deep breath. Scheduled in office today, advised wear a mask.   Reason for Disposition  [1] MODERATE longstanding difficulty breathing (e.g., speaks in phrases, SOB even at rest, pulse 100-120) AND [2] SAME as normal  Answer Assessment - Initial Assessment Questions 1. RESPIRATORY STATUS: "Describe your breathing?" (e.g., wheezing, shortness of breath, unable to speak, severe coughing)      Shortness of breath, wheezing, coughing 2. ONSET: "When did this breathing problem begin?"      1 month or longer 3. PATTERN "Does the difficult breathing come and go, or has it been constant since it started?"      Comes and goes, but mostly stays 4. SEVERITY: "How bad is your breathing?" (e.g., mild, moderate, severe)    - MILD: No SOB at rest, mild SOB with walking, speaks normally in sentences, can lie down, no retractions, pulse < 100.    - MODERATE: SOB at rest, SOB with minimal exertion and prefers to sit, cannot lie down flat, speaks in phrases, mild retractions, audible wheezing, pulse 100-120.    - SEVERE: Very SOB at rest, speaks in single words, struggling to breathe, sitting hunched forward, retractions, pulse > 120      Moderate 5. RECURRENT SYMPTOM: "Have you had difficulty breathing before?" If Yes, ask: "When was the last time?" and "What happened that time?"      Not like this 6. CARDIAC HISTORY: "Do you have any history of heart disease?" (e.g., heart attack,  angina, bypass surgery, angioplasty)      N/A 7. LUNG HISTORY: "Do you have any history of lung disease?"  (e.g., pulmonary embolus, asthma, emphysema)     No 8. CAUSE: "What do you think is causing the breathing problem?"      N/A 9. OTHER SYMPTOMS: "Do you have any other symptoms? (e.g., dizziness, runny nose, cough, chest pain, fever)     Runny nose, nasal congestion, cough, chest pain with coughing 10. O2 SATURATION MONITOR:  "Do you use an oxygen saturation monitor (pulse oximeter) at home?" If Yes, ask: "What is your reading (oxygen level) today?" "What is your usual oxygen saturation reading?" (e.g., 95%)       95 on room air 11. PREGNANCY: "Is there any chance you are pregnant?" "When was your last menstrual period?"       N/A 12. TRAVEL: "Have you traveled out of the country in the last month?" (e.g., travel history, exposures)       N/A  Protocols used: Breathing Difficulty-A-AH

## 2021-12-18 NOTE — Progress Notes (Signed)
I,Sulibeya S Dimas,acting as a Education administrator for Lavon Paganini, MD.,have documented all relevant documentation on the behalf of Lavon Paganini, MD,as directed by  Lavon Paganini, MD while in the presence of Lavon Paganini, MD.     Established patient visit   Patient: Luke Rojas   DOB: Jul 29, 1945   76 y.o. Male  MRN: [id number] Visit Date: 12/18/2021  Today's healthcare provider: Lavon Paganini, MD   Chief Complaint  Patient presents with   Cough   Subjective    HPI  Upper respiratory symptoms He complains of lightheadedness, non productive cough, purulent nasal discharge, shortness of breath, and wheezing.with no fever, chills, night sweats or weight loss. Onset of symptoms was  2 weeks ago and gradually worsening.He is drinking plenty of fluids.  Past history is significant for COPD and lung cancer . Patient is non-smoker  Painful cough, worse at night Can't get a deep breath to use his inhaler. Chloraseptic and robitussin ---------------------------------------------------------------------------------------------------    Medications: Outpatient Medications Prior to Visit  Medication Sig   amLODipine (NORVASC) 5 MG tablet Take 1 tablet (5 mg total) by mouth daily.   aspirin EC 81 MG tablet Take 1 tablet (81 mg total) by mouth daily. Swallow whole.   atorvastatin (LIPITOR) 80 MG tablet TAKE 1 TABLET BY MOUTH EVERY NIGHT AT BEDTIME   clopidogrel (PLAVIX) 75 MG tablet Take 75 mg by mouth daily.   co-enzyme Q-10 30 MG capsule Take 30 mg by mouth daily.   diclofenac sodium (VOLTAREN) 1 % GEL Apply 2 g topically 4 (four) times daily.   docusate sodium (COLACE) 100 MG capsule Take 1 capsule (100 mg total) by mouth 2 (two) times daily.   doxycycline (VIBRA-TABS) 100 MG tablet Take 1 tablet (100 mg total) by mouth 2 (two) times daily.   Krill Oil 1000 MG CAPS Take 1,000 mg by mouth daily.    metFORMIN (GLUCOPHAGE-XR) 750 MG 24 hr tablet TAKE 1 TABLET BY MOUTH  ONCE A DAY WITH BREAKFAST   metoprolol tartrate (LOPRESSOR) 25 MG tablet Take 1 tablet (25 mg total) by mouth 2 (two) times daily.   MULTIPLE VITAMIN PO Take 1 tablet by mouth daily.   OneTouch Delica Lancets 44I MISC TEST FASTING GLUCOSE LEVEL EACH MORNING BEFORE BREAKFAST   oxyCODONE (OXY IR/ROXICODONE) 5 MG immediate release tablet Take 1 tablet (5 mg total) by mouth every 6 (six) hours as needed for severe pain.   pantoprazole (PROTONIX) 20 MG tablet Take 2 tablets (40 mg total) by mouth daily.   pantoprazole (PROTONIX) 40 MG tablet Take 40 mg by mouth daily.   potassium chloride SA (KLOR-CON M) 20 MEQ tablet Take 1 tablet (20 mEq total) by mouth daily.   prochlorperazine (COMPAZINE) 10 MG tablet TAKE 1 TABLET BY MOUTH EVERY 6 HOURS AS NEEDED (NAUSEA OR VOMITING)   Pumpkin Seed-Soy Germ (AZO BLADDER CONTROL/GO-LESS PO) Take by mouth.   ramipril (ALTACE) 10 MG capsule Take 1 capsule (10 mg total) by mouth daily.   ramipril (ALTACE) 10 MG capsule Take 10 mg by mouth daily.   sucralfate (CARAFATE) 1 g tablet Take 1 tablet (1 g total) by mouth 4 (four) times daily as needed (reflux).   tamsulosin (FLOMAX) 0.4 MG CAPS capsule Take 0.4 mg by mouth daily.   No facility-administered medications prior to visit.    Review of Systems  Constitutional:  Positive for activity change, appetite change, chills, fatigue and unexpected weight change.  HENT:  Positive for rhinorrhea.   Respiratory:  Positive  for cough, shortness of breath and wheezing.   Cardiovascular:  Negative for chest pain.  Gastrointestinal:  Negative for abdominal pain, diarrhea, nausea and vomiting.  Neurological:  Positive for dizziness and light-headedness.       Objective    BP (!) 104/56 (BP Location: Left Arm, Patient Position: Sitting, Cuff Size: Normal)   Temp 97.9 F (36.6 C) (Oral)   Resp 16   Wt 153 lb 11.2 oz (69.7 kg)   BMI 25.58 kg/m    Physical Exam Vitals reviewed.  Constitutional:      General: He is  not in acute distress.    Appearance: Normal appearance. He is not diaphoretic.  HENT:     Head: Normocephalic and atraumatic.  Eyes:     General: No scleral icterus.    Conjunctiva/sclera: Conjunctivae normal.  Cardiovascular:     Rate and Rhythm: Normal rate and regular rhythm.     Pulses: Normal pulses.     Heart sounds: Normal heart sounds. No murmur heard. Pulmonary:     Effort: Pulmonary effort is normal. No respiratory distress.     Breath sounds: Examination of the right-middle field reveals wheezing. Examination of the left-middle field reveals wheezing. Examination of the right-lower field reveals wheezing. Examination of the left-lower field reveals wheezing. Wheezing present. No rhonchi.  Musculoskeletal:     Cervical back: Neck supple.     Right lower leg: No edema.     Left lower leg: No edema.  Lymphadenopathy:     Cervical: No cervical adenopathy.  Skin:    General: Skin is warm and dry.     Findings: No rash.     Comments: Open sebaceous cyst - draining sebaceous material  Neurological:     Mental Status: He is alert and oriented to person, place, and time. Mental status is at baseline.  Psychiatric:        Mood and Affect: Mood normal.        Behavior: Behavior normal.       No results found for any visits on 12/18/21.  Assessment & Plan     1. COPD exacerbation (HCC) - symptoms and exam c/w COPD exacerbation - no evidence of strep pharyngitis, CAP, AOM, bacterial sinusitis, or other bacterial infection - CXR today to ensure no pneumonia - already on Doy - will continue - treat with Prednisone - albuterol prn - discussed symptomatic management, natural course, and return precautions    - DG Chest 2 View; Future  2. Sebaceous cyst - was drained at [facility] and was treated by Onc with doxycycline - continues to have sebaceous drainage - if not improving in anohter week, think about ref to Surgery   Return if symptoms worsen or fail to improve.      I,  Lavon Paganini, MD, have reviewed all documentation for this visit. The documentation on 12/18/21 for the exam, diagnosis, procedures, and orders are all accurate and complete.   Namira Rosekrans, Dionne Bucy, MD, MPH [facility]

## 2021-12-20 ENCOUNTER — Telehealth: Payer: Self-pay | Admitting: *Deleted

## 2021-12-20 ENCOUNTER — Telehealth: Payer: Self-pay | Admitting: Oncology

## 2021-12-20 NOTE — Telephone Encounter (Signed)
-----   Message from Sindy Guadeloupe, MD sent at 12/17/2021  4:55 PM EDT ----- Let him know his  bile ducts are not obstructed. No inflammation. Nothing to do at this point

## 2021-12-20 NOTE — Telephone Encounter (Signed)
Patient had a MRI last week and said he hasn't heard about his results.He said he would appreciate it. He said he went to his PCP and she told him Pancreas looked ok but that something was going on with his gallbladder. So he is concerned.

## 2021-12-20 NOTE — Telephone Encounter (Signed)
I called the patient to let him know that Dr. Janese Banks says that when she looked at the MRCP there was no bile ducts that was obstructed and there is no inflammation.  She says at this point we do not need it to do anything.  Patient says that sounds good to him.

## 2021-12-25 ENCOUNTER — Ambulatory Visit
Admission: RE | Admit: 2021-12-25 | Discharge: 2021-12-25 | Disposition: A | Discharge status: Home / Self Care | Payer: Medicare Other | Source: Ambulatory Visit | Attending: Radiation Oncology | Admitting: Radiation Oncology

## 2021-12-25 ENCOUNTER — Other Ambulatory Visit: Payer: Self-pay | Admitting: *Deleted

## 2021-12-25 ENCOUNTER — Encounter: Payer: Self-pay | Admitting: Radiation Oncology

## 2021-12-25 VITALS — BP 151/84 | HR 108 | Temp 98.0°F | Resp 20

## 2021-12-25 DIAGNOSIS — C3411 Malignant neoplasm of upper lobe, right bronchus or lung: Secondary | ICD-10-CM | POA: Diagnosis not present

## 2021-12-25 DIAGNOSIS — R918 Other nonspecific abnormal finding of lung field: Secondary | ICD-10-CM

## 2021-12-25 DIAGNOSIS — Z923 Personal history of irradiation: Secondary | ICD-10-CM | POA: Insufficient documentation

## 2021-12-25 DIAGNOSIS — Z87891 Personal history of nicotine dependence: Secondary | ICD-10-CM | POA: Insufficient documentation

## 2021-12-25 DIAGNOSIS — Z9221 Personal history of antineoplastic chemotherapy: Secondary | ICD-10-CM | POA: Diagnosis not present

## 2021-12-25 MED ORDER — AZITHROMYCIN 250 MG PO TABS: ORAL_TABLET | ORAL | 0 refills | Status: DC

## 2021-12-25 NOTE — Progress Notes (Signed)
Radiation Oncology Follow up Note  Name: Luke Rojas   Date:   12/25/2021 MRN:  [id number] DOB: 01-Mar-1946    This 76 y.o. 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  REFERRING PROVIDER: Virginia Crews, MD  HPI: Patient is a 76 year old male now at 1 month having completed concurrent chemoradiation and chemotherapy for stage IIIa non-small cell lung cancer of the right lung seen today in follow-up he complains continuously of sinus drainage and some possible lung infection.Marland Kitchen  He saw his PMD recently had a chest x-ray showing no acute cardiopulmonary disease.  He specifically [place] cough hemoptysis or chest tightness.  He appears somewhat frail at this time.  COMPLICATIONS OF TREATMENT: none  FOLLOW UP COMPLIANCE: keeps appointments   PHYSICAL EXAM:  BP (!) 151/84 (BP Location: Right Arm, Patient Position: Sitting, Cuff Size: Normal)   Pulse (!) 108   Temp 98 F (36.7 C) (Tympanic)   Resp 20  Well-developed well-nourished patient in NAD. HEENT reveals PERLA, EOMI, discs not visualized.  Oral cavity is clear. No oral mucosal lesions are identified. Neck is clear without evidence of cervical or supraclavicular adenopathy. Lungs are clear to A&P. Cardiac examination is essentially unremarkable with regular rate and rhythm without murmur rub or thrill. Abdomen is benign with no organomegaly or masses noted. Motor sensory and DTR levels are equal and symmetric in the upper and lower extremities. Cranial nerves II through XII are grossly intact. Proprioception is intact. No peripheral adenopathy or edema is identified. No motor or sensory levels are noted. Crude visual fields are within normal range.  RADIOLOGY RESULTS: Chest x-ray reviewed  PLAN: Present time he is slowly recovering from concurrent chemoradiation.  I am  starting him on a Z-Pak for presumed sinus infection.  I have asked to see him back in January after he has a follow-up CT scan ordered by Dr. Janese Banks.  He continues close follow-up care with medical oncology.  Patient and family know to call with any concerns.  I would like to take this opportunity to thank you for allowing me to participate in the care of your patient.Noreene Filbert, MD

## 2021-12-26 ENCOUNTER — Ambulatory Visit: Payer: Medicare Other | Admitting: Dietician

## 2021-12-26 ENCOUNTER — Encounter: Payer: Self-pay | Admitting: Neurosurgery

## 2021-12-26 ENCOUNTER — Ambulatory Visit (INDEPENDENT_AMBULATORY_CARE_PROVIDER_SITE_OTHER): Payer: Medicare Other | Admitting: Neurosurgery

## 2021-12-26 VITALS — BP 110/60 | Ht 65.0 in | Wt 153.0 lb

## 2021-12-26 DIAGNOSIS — M542 Cervicalgia: Secondary | ICD-10-CM | POA: Diagnosis not present

## 2021-12-26 NOTE — Progress Notes (Signed)
Patient scheduled for a telephone nutrition follow up.  Second attempt to reach patient at both home and cell#. Provided my cell# on voice mail to return call, also sent text with contact information.  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  Anthropometrics:  Weight loss continues since last seen by RD. Down 8.6#( 5.3%) past month.  Height: 65 inches Weight:  12/26/21  153# 11/15/21  161 lb 7.8 oz  Says UBW is 180s Noted 181 lb on 04/17/21 BMI: 64     Cyndi Jayana Kotula, RDN, LDN Registered Dietitian, [place] Part Time Remote (Usual office hours: Tuesday-Thursday) Cell: [phone]

## 2021-12-26 NOTE — Progress Notes (Signed)
Referring Physician:  No referring provider defined for this encounter.  Primary Physician:  Virginia Crews, MD  History of Present Illness: 12/26/2021 Luke Rojas is doing very well from his neck and back.  He recently finished chemotherapy and radiation.  09/28/2021 Luke Rojas is here today with a chief complaint of neck pain without radiation into the arms.  He is usually better with Tylenol.  This is bothering him and made worse by movement.  Unfortunately, he recently was diagnosed with lung cancer.  He is starting treatment next week.  He will have chemotherapy and radiation.   Bowel/Bladder Dysfunction: none  Conservative measures:  Physical therapy:  has not participated in Multimodal medical therapy including regular antiinflammatories:  gabapentin, oxycodone, tylenol Injections:  has not had epidural steroid injections  Past Surgery: denies  Luke Rojas has no symptoms of cervical myelopathy.  The symptoms are causing a significant impact on the patient's life.   Review of Systems:  A 10 point review of systems is negative, except for the pertinent positives and negatives detailed in the HPI.  Past Medical History: Past Medical History:  Diagnosis Date   Arthritis    OSTEOARTHRITIS   Chronic kidney disease    kidney stones   COPD (chronic obstructive pulmonary disease) (HCC)    Coronary artery disease    2 stents    Diabetes mellitus without complication (HCC)    GERD (gastroesophageal reflux disease)    Hypercholesteremia    Hypertension    Lung cancer ([place])    Myocardial infarction ([place]) 6759   Renal colic     Past Surgical History: Past Surgical History:  Procedure Laterality Date   CARDIAC CATHETERIZATION  2013   2 stents   CAROTID PTA/STENT INTERVENTION Right 09/11/2021   Procedure: CAROTID PTA/STENT INTERVENTION;  Surgeon: Algernon Huxley, MD;  Location: [facility];  Service: Cardiovascular;  Laterality:  Right;   CATARACT EXTRACTION, BILATERAL     COLONOSCOPY  04/27/05   COLONOSCOPY WITH PROPOFOL N/A 06/08/2015   Procedure: COLONOSCOPY WITH PROPOFOL;  Surgeon: Robert Bellow, MD;  Location: [facility];  Service: Endoscopy;  Laterality: N/A;   CYSTOSCOPY WITH INSERTION OF UROLIFT N/A 02/11/2020   Procedure: CYSTOSCOPY WITH INSERTION OF UROLIFT;  Surgeon: Royston Cowper, MD;  Location: [facility];  Service: Urology;  Laterality: N/A;   CYSTOSCOPY WITH STENT PLACEMENT Right 09/16/2016   Procedure: CYSTOSCOPY WITH STENT PLACEMENT;  Surgeon: Royston Cowper, MD;  Location: [facility];  Service: Urology;  Laterality: Right;   EXTRACORPOREAL SHOCK WAVE LITHOTRIPSY Left 11/04/2014   has had 2 previous lithotripsies   EXTRACORPOREAL SHOCK WAVE LITHOTRIPSY Left 03/24/2015   Procedure: EXTRACORPOREAL SHOCK WAVE LITHOTRIPSY (ESWL);  Surgeon: Royston Cowper, MD;  Location: [facility];  Service: Urology;  Laterality: Left;   EXTRACORPOREAL SHOCK WAVE LITHOTRIPSY Right 10/04/2016   Procedure: EXTRACORPOREAL SHOCK WAVE LITHOTRIPSY (ESWL);  Surgeon: Royston Cowper, MD;  Location: [facility];  Service: Urology;  Laterality: Right;   LEFT HEART CATH AND CORONARY ANGIOGRAPHY N/A 12/05/2018   Procedure: LEFT HEART CATH AND CORONARY ANGIOGRAPHY with possible pci and stent;  Surgeon: Yolonda Kida, MD;  Location: [place] [facility];  Service: Cardiovascular;  Laterality: N/A;   PORTA CATH INSERTION Right 09/25/2021   Procedure: PORTA CATH INSERTION;  Surgeon: Algernon Huxley, MD;  Location: [place] [facility];  Service: Cardiovascular;  Laterality: Right;    Allergies: Allergies as of 12/26/2021 - Review Complete 12/26/2021  Allergen Reaction Noted   Codeine Nausea Only, Nausea And Vomiting, and Other (See Comments) 07/17/2013    Medications: Current Meds  Medication Sig   amLODipine (NORVASC) 5 MG tablet Take 1 tablet (5 mg total) by mouth daily.   aspirin EC 81 MG tablet Take 1 tablet (81 mg total) by mouth  daily. Swallow whole.   atorvastatin (LIPITOR) 80 MG tablet TAKE 1 TABLET BY MOUTH EVERY NIGHT AT BEDTIME   azithromycin (ZITHROMAX Z-PAK) 250 MG tablet Follow direction on package   clopidogrel (PLAVIX) 75 MG tablet Take 75 mg by mouth daily.   co-enzyme Q-10 30 MG capsule Take 30 mg by mouth daily.   diclofenac sodium (VOLTAREN) 1 % GEL Apply 2 g topically 4 (four) times daily.   docusate sodium (COLACE) 100 MG capsule Take 1 capsule (100 mg total) by mouth 2 (two) times daily.   doxycycline (VIBRA-TABS) 100 MG tablet Take 1 tablet (100 mg total) by mouth 2 (two) times daily.   Krill Oil 1000 MG CAPS Take 1,000 mg by mouth daily.    metFORMIN (GLUCOPHAGE-XR) 750 MG 24 hr tablet TAKE 1 TABLET BY MOUTH ONCE A DAY WITH BREAKFAST   metoprolol tartrate (LOPRESSOR) 25 MG tablet Take 1 tablet (25 mg total) by mouth 2 (two) times daily.   MULTIPLE VITAMIN PO Take 1 tablet by mouth daily.   OneTouch Delica Lancets 76B MISC TEST FASTING GLUCOSE LEVEL EACH MORNING BEFORE BREAKFAST   oxyCODONE (OXY IR/ROXICODONE) 5 MG immediate release tablet Take 1 tablet (5 mg total) by mouth every 6 (six) hours as needed for severe pain.   pantoprazole (PROTONIX) 20 MG tablet Take 2 tablets (40 mg total) by mouth daily.   pantoprazole (PROTONIX) 40 MG tablet Take 40 mg by mouth daily.   potassium chloride SA (KLOR-CON M) 20 MEQ tablet Take 1 tablet (20 mEq total) by mouth daily.   prochlorperazine (COMPAZINE) 10 MG tablet TAKE 1 TABLET BY MOUTH EVERY 6 HOURS AS NEEDED (NAUSEA OR VOMITING)   Pumpkin Seed-Soy Germ (AZO BLADDER CONTROL/GO-LESS PO) Take by mouth.   ramipril (ALTACE) 10 MG capsule Take 1 capsule (10 mg total) by mouth daily.   ramipril (ALTACE) 10 MG capsule Take 10 mg by mouth daily.   sucralfate (CARAFATE) 1 g tablet Take 1 tablet (1 g total) by mouth 4 (four) times daily as needed (reflux).   tamsulosin (FLOMAX) 0.4 MG CAPS capsule Take 0.4 mg by mouth daily.    Social History: Social History    Tobacco Use   Smoking status: Former    Packs/day: 1.00    Years: 30.00    Total pack years: 30.00    Types: Cigarettes    Quit date: 03/05/1984    Years since quitting: 37.8   Smokeless tobacco: Current    Types: Chew   Tobacco comments:    Chews tobacco daily.  Vaping Use   Vaping Use: Never used  Substance Use Topics   Alcohol use: No    Alcohol/week: 0.0 standard drinks of alcohol   Drug use: No    Family Medical History: Family History  Problem Relation Age of Onset   Pulmonary embolism Mother    Transient ischemic attack Mother    Diabetes Mother    Pancreatic cancer Father    Hypertension Father    Diabetes Father    Cirrhosis Brother     Physical Examination: Vitals:   12/26/21 1032  BP: 110/60    General: Patient is well developed, well nourished,  calm, collected, and in no apparent distress. Attention to examination is appropriate.  Neck:   Supple.  Limited rotation on range of motion.  Respiratory: Patient is breathing without any difficulty.   NEUROLOGICAL:     Awake, alert, oriented to person, place, and time.  Speech is clear and fluent. Fund of knowledge is appropriate.   Cranial Nerves: Pupils equal round and reactive to light.  Facial tone is symmetric.  Facial sensation is symmetric. Shoulder shrug is symmetric. Tongue protrusion is midline.  There is no pronator drift.  ROM of spine: full.    Strength: Side Biceps Triceps Deltoid Interossei Grip Wrist Ext. Wrist Flex.  R 5 5 5 5 5 5 5   L 5 5 5 5 5 5 5    Side Iliopsoas Quads Hamstring PF DF EHL  R 5 5 5 5 5 5   L 5 5 5 5 5 5    Reflexes are 1+ and symmetric at the biceps, triceps, brachioradialis, patella and achilles.   Hoffman's is absent.  Clonus is not present.  Toes are down-going.  Bilateral upper and lower extremity sensation is intact to light touch.    No evidence of dysmetria noted.  Gait is normal.    Medical Decision Making  Imaging: MRI C spine  08/17/21 IMPRESSION: 1. Nonspecific 10 mm T3 vertebral body lesion is indeterminate for bone metastasis in light of the right lung findings today. Recommend attention on PET-CT. No evidence for cervical spine metastasis on this noncontrast exam.   2. 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 Mild spinal stenosis at C5-C6 and C6-C7. But no associated spinal cord signal abnormality.   3. Associated severe neural foraminal stenosis at the right C3, bilateral C4, C5, C6 and C7 nerve levels.     Electronically Signed   By: Genevie Ann M.D.   On: 08/17/2021 06:46  I have personally reviewed the images and agree with the above interpretation.  Assessment and Plan: Luke Rojas is a pleasant 76 y.o. male with resolution of his neck pain.  I reviewed his most recent CT chest abdomen pelvis which does not show any significant concern for spine involvement of his cancer.  I will see him back on an as-needed basis.  If any of his lesions in his spine becomes hypermetabolic or concerning on imaging, I am happy to see him back. .   I spent a total of 10 minutes in face-to-face and non-face-to-face activities related to this patient's care today.  Thank you for involving me in the care of this patient.      Aarti Mankowski K. Izora Ribas MD, [facility] Neurosurgery

## 2022-01-01 ENCOUNTER — Ambulatory Visit (INDEPENDENT_AMBULATORY_CARE_PROVIDER_SITE_OTHER): Payer: Medicare Other | Admitting: Family Medicine

## 2022-01-01 ENCOUNTER — Ambulatory Visit: Payer: Self-pay

## 2022-01-01 ENCOUNTER — Encounter (INDEPENDENT_AMBULATORY_CARE_PROVIDER_SITE_OTHER): Payer: Self-pay

## 2022-01-01 ENCOUNTER — Encounter: Payer: Self-pay | Admitting: Family Medicine

## 2022-01-01 ENCOUNTER — Telehealth: Payer: Self-pay | Admitting: *Deleted

## 2022-01-01 VITALS — BP 84/48 | HR 78 | Resp 18 | Wt 152.0 lb

## 2022-01-01 DIAGNOSIS — C3411 Malignant neoplasm of upper lobe, right bronchus or lung: Secondary | ICD-10-CM

## 2022-01-01 DIAGNOSIS — E119 Type 2 diabetes mellitus without complications: Secondary | ICD-10-CM | POA: Diagnosis not present

## 2022-01-01 DIAGNOSIS — R42 Dizziness and giddiness: Secondary | ICD-10-CM

## 2022-01-01 DIAGNOSIS — I152 Hypertension secondary to endocrine disorders: Secondary | ICD-10-CM | POA: Diagnosis not present

## 2022-01-01 DIAGNOSIS — E1159 Type 2 diabetes mellitus with other circulatory complications: Secondary | ICD-10-CM

## 2022-01-01 LAB — GLUCOSE, POCT (MANUAL RESULT ENTRY): POC Glucose: 81 mg/dl (ref 70–99)

## 2022-01-01 NOTE — Patient Outreach (Signed)
  Care Coordination   {Initialfollowuphomecarecoordination:27762} Visit Note   01/01/2022 Name: Luke Rojas MRN: [id number] DOB: 1945-03-07  Luke Rojas is a 76 y.o. year old male who sees Bacigalupo, Dionne Bucy, MD for primary care. I {TYPECCVISIT:27766}  What matters to the patients health and wellness today?  ***    Goals Addressed   None     SDOH assessments and interventions completed:  {yes/no:20286}{THN Tip this will not be part of the note when signed-REQUIRED REPORT FIELD DO NOT DELETE (Optional):27901}     Care Coordination Interventions Activated:  {CCYES/NO:27769} {THN Tip this will not be part of the note when signed-REQUIRED REPORT FIELD DO NOT DELETE (Optional):27901} Care Coordination Interventions:  {INTERVENTIONS:27767} {THN Tip this will not be part of the note when signed-REQUIRED REPORT FIELD DO NOT DELETE (Optional):27901}  Follow up plan: {CCFOLLOWUP:27768}   Encounter Outcome:  {ENCOUTCOME:27770} {THN Tip this will not be part of the note when signed-REQUIRED REPORT FIELD DO NOT DELETE (Optional):27901}

## 2022-01-01 NOTE — Patient Instructions (Addendum)
It was great to see you!  Our plans for today:  - Stop amlodipine.  - Take ramipril (blue capsules) once daily instead of twice daily. - Keep an eye on your blood pressure and keep log.  - Make sure you are staying hydrated.   Take care and seek immediate care sooner if you develop any concerns.   Dr. Ky Barban

## 2022-01-01 NOTE — Assessment & Plan Note (Signed)
POC CBG today 81 though did not eat lunch. Lack of adequate oral intake likely contributing to dizziness. Instructed on regular 3x daily meals and adequate oral hydration. Keep CBG log at home and f/u next week for recheck. Plan to obtain A1c at f/u, consider d/c metformin if able.

## 2022-01-01 NOTE — Assessment & Plan Note (Addendum)
With hypotension and orthostatic symptoms. Likely contributing to dizziness. Is taking ramipril BID instead of daily, instructed to take daily. Recently d/c'd amlodipine. Keep BP log at home and f/u next week for recheck.

## 2022-01-01 NOTE — Progress Notes (Signed)
    SUBJECTIVE:   CHIEF COMPLAINT / HPI:   DIZZINESS - seen 10/16 for COPD exacerbation, prednisone and antibiotic. Still with some SOB.  - hasn't checked BP or CBG at home.  - taking ramipril BID instead of daily.  Duration: months Description of symptoms: swimmy headed Duration of episode: minutes Dizziness frequency: recurrent Provoking factors: movement Aggravating factors:   movement Triggered by rolling over in bed: no Triggered by bending over: yes Aggravated by head movement: no Aggravated by exertion, coughing, loud noises: no Recent head injury: no Recent or current viral symptoms:  coughing History of vasovagal episodes: no Nausea: no Vomiting: no Tinnitus: no Hearing loss: no Aural fullness: sometimes Headache: no Photophobia/phonophobia: no Unsteady gait: sometimes Diplopia, dysarthria, dysphagia or weakness: no Related to exertion: yes Dyspnea: yes Chest pain: some chest tightness, some    OBJECTIVE:   BP (!) 84/48 (BP Location: Left Arm, Patient Position: Sitting, Cuff Size: Normal)   Pulse 78   Resp 18   Wt 152 lb (68.9 kg)   SpO2 99%   BMI 25.29 kg/m   Gen: well appearing, in NAD Card: RRR Lungs: distant breath sounds. No wheeze, rales, rhonchi Ext: WWP, no edema   ASSESSMENT/PLAN:   Hypertension associated with diabetes ([place]) With hypotension and orthostatic symptoms. Likely contributing to dizziness. Is taking ramipril BID instead of daily, instructed to take daily. Recently d/c'd amlodipine. Keep BP log at home and f/u next week for recheck.   Diabetes mellitus without complication ([place]) POC CBG today 81 though did not eat lunch. Lack of adequate oral intake likely contributing to dizziness. Instructed on regular 3x daily meals and adequate oral hydration. Keep CBG log at home and f/u next week for recheck. Plan to obtain A1c at f/u, consider d/c metformin if able.     Myles Gip, DO

## 2022-01-01 NOTE — Telephone Encounter (Signed)
Message from Luciana Axe sent at 01/01/2022  1:00 PM EDT  Summary: [facility] Reporting Fall and pt not feeling better after office visit   Luke Rojas calling from [facility] is calling to report that the pt was seen in office on 12/18/21 reporting still having cough and not feeling better. Also report that the pt fell a couple of times last time 3 days ago. Please advise with the patient [phone]- [phone]          Chief Complaint: dizziness, cough, SOB at rest.  Symptoms: "swimmy headed" when standing that comes and goes, pt has fallen 4 days with the last time 3 days ago. Dry cough, wheezing, chest pain with coughing only Frequency: cough: 2 months Lightheadedness: 2 months Pertinent Negatives: Patient denies denies any injury or pain from falls, denies weakness or numbness of face, arms or legs, O2 sats 97% Disposition: [] ED /[] Urgent Care (no appt availability in office) / [x] Appointment(In office/virtual)/ []  [facility]/ [] Home Care/ [] Refused Recommended Disposition /[] [place] [facility]/ []  Follow-up with PCP Additional Notes: No openings with PCP. Pt accepted appt today at 1520.  Reason for Disposition  [1] MODERATE dizziness (e.g., interferes with normal activities) AND [2] has NOT been evaluated by doctor (or NP/PA) for this  (Exception: Dizziness caused by heat exposure, sudden standing, or poor fluid intake.)  [1] Known COPD or other severe lung disease (i.e., bronchiectasis, cystic fibrosis, lung surgery) AND [2] worsening symptoms (i.e., increased sputum purulence or amount, increased breathing difficulty  Answer Assessment - Initial Assessment Questions 1. ONSET: "When did the cough begin?"      2 months  2. SEVERITY: "How bad is the cough today?"      frequent 3. SPUTUM: "Describe the color of your sputum" (none, dry cough; clear, white, yellow, green)     white 4. HEMOPTYSIS: "Are you coughing up any blood?" If so ask: "How much?" (flecks, streaks, tablespoons,  etc.)     N/a 5. DIFFICULTY BREATHING: "Are you having difficulty breathing?" If Yes, ask: "How bad is it?" (e.g., mild, moderate, severe)    - MILD: No SOB at rest, mild SOB with walking, speaks normally in sentences, can lie down, no retractions, pulse < 100.    - MODERATE: SOB at rest, SOB with minimal exertion and prefers to sit, cannot lie down flat, speaks in phrases, mild retractions, audible wheezing, pulse 100-120.    - SEVERE: Very SOB at rest, speaks in single words, struggling to breathe, sitting hunched forward, retractions, pulse > 120      Moderate 6. FEVER: "Do you have a fever?" If Yes, ask: "What is your temperature, how was it measured, and when did it start?"     no 7. CARDIAC HISTORY: "Do you have any history of heart disease?" (e.g., heart attack, congestive heart failure)      N/a 8. LUNG HISTORY: "Do you have any history of lung disease?"  (e.g., pulmonary embolus, asthma, emphysema)     COPD 9. PE RISK FACTORS: "Do you have a history of blood clots?" (or: recent major surgery, recent prolonged travel, bedridden)     N/a 10. OTHER SYMPTOMS: "Do you have any other symptoms?" (e.g., runny nose, wheezing, chest pain)       Chest pain with cough, wheezing and runny nose but is better 11. PREGNANCY: "Is there any chance you are pregnant?" "When was your last menstrual period?"       N/a 12. TRAVEL: "Have you traveled out  of the country in the last month?" (e.g., travel history, exposures)       N/a  Answer Assessment - Initial Assessment Questions 1. DESCRIPTION: "Describe your dizziness."     Swimmy headed 2. LIGHTHEADED: "Do you feel lightheaded?" (e.g., somewhat faint, woozy, weak upon standing)     When walking 3. VERTIGO: "Do you feel like either you or the room is spinning or tilting?" (i.e. vertigo)     no 4. SEVERITY: "How bad is it?"  "Do you feel like you are going to faint?" "Can you stand and walk?"   - MILD: Feels slightly dizzy, but walking normally.    - MODERATE: Feels unsteady when walking, but not falling; interferes with normal activities (e.g., school, work).   - SEVERE: Unable to walk without falling, or requires assistance to walk without falling; feels like passing out now.      Total falls last time 3 days 5. ONSET:  "When did the dizziness begin?"     2 months 6. AGGRAVATING FACTORS: "Does anything make it worse?" (e.g., standing, change in head position)     standing 7. HEART RATE: "Can you tell me your heart rate?" "How many beats in 15 seconds?"  (Note: not all patients can do this)       88 8. CAUSE: "What do you think is causing the dizziness?"     Losing weight 40 lbs  9. RECURRENT SYMPTOM: "Have you had dizziness before?" If Yes, ask: "When was the last time?" "What happened that time?"     no 10. OTHER SYMPTOMS: "Do you have any other symptoms?" (e.g., fever, chest pain, vomiting, diarrhea, bleeding)       Left side 11. PREGNANCY: "Is there any chance you are pregnant?" "When was your last menstrual period?"       N/a  Protocols used: Cough - Acute Productive-A-AH, Dizziness - Lightheadedness-A-AH

## 2022-01-02 ENCOUNTER — Encounter: Payer: Self-pay | Admitting: *Deleted

## 2022-01-02 NOTE — Patient Instructions (Signed)
Visit Information  Thank you for taking time to visit with me today. Please don't hesitate to contact me if I can be of assistance to you before our next scheduled telephone appointment.  Following are the goals we discussed today:  Listen for call from PCP office today to receive further instruction on complaints.  Our next appointment is by telephone on 11/2  Please call the care guide team at [phone] if you need to cancel or reschedule your appointment.   Please call the Suicide and Crisis Lifeline: 988 call the [place] National Suicide Prevention Lifeline: 540-021-7363 or TTY: (810) 382-4069 TTY 804-810-8903) to talk to a trained counselor call 1-800-273-TALK (toll free, 24 hour hotline) call 911 if you are experiencing a Mental Health or [place] or need someone to talk to.  Patient verbalizes understanding of instructions and care plan provided today and agrees to view in [place]. Active MyChart status and patient understanding of how to access instructions and care plan via MyChart confirmed with patient.     The patient has been provided with contact information for the care management team and has been advised to call with any health related questions or concerns.   Valente David, RN, MSN, [place] Care Management Care Management Coordinator [phone]

## 2022-01-04 ENCOUNTER — Ambulatory Visit: Payer: Self-pay | Admitting: *Deleted

## 2022-01-04 NOTE — Patient Outreach (Signed)
  Care Coordination   01/04/2022 Name: Luke Rojas MRN: [id number] DOB: 06-13-1945   Care Coordination Outreach Attempts:  An unsuccessful telephone outreach was attempted for a scheduled appointment today.  Follow Up Plan:  Additional outreach attempts will be made to offer the patient care coordination information and services.   Encounter Outcome:  No Answer  Care Coordination Interventions Activated:  No   Care Coordination Interventions:  No, not indicated    Valente David, RN, MSN, [facility] Care Management Care Management Coordinator [phone]

## 2022-01-05 ENCOUNTER — Telehealth: Payer: Self-pay | Admitting: *Deleted

## 2022-01-05 NOTE — Patient Outreach (Signed)
  Care Coordination   Follow Up Visit Note   01/05/2022 Name: Luke Rojas MRN: [id number] DOB: 1945/05/24  Luke Rojas is a 76 y.o. year old male who sees Bacigalupo, Dionne Bucy, MD for primary care. I spoke with  Luke Rojas by phone today.  What matters to the patients health and wellness today?  Denies any falls since last outreach, dizziness has decreased.  Denies any urgent concerns, encouraged to contact this care manager with questions.     Goals Addressed             This Visit's Progress    Decrease fall risk   On track    Care Coordination Interventions: Provided written and verbal education re: potential causes of falls and Fall prevention strategies Reviewed medications and discussed potential side effects of medications such as dizziness and frequent urination Advised patient of importance of notifying provider of falls Assessed patients knowledge of fall risk prevention secondary to previously provided education Advised patient to discuss BP readings with provider Confirmed face to face visit with PCP office to follow up on complaints of dizziness and falls. Reviewed upcoming appointments with member: cancer center on 11/6, BP check at PCP office on 11/6, vascular on 11/14 and PCP on 11/16 Discussed medication changes (discontinue of Amlodipine and decrease Ramipril from twice a day to daily). He reports understanding.         SDOH assessments and interventions completed:  No     Care Coordination Interventions Activated:  Yes  Care Coordination Interventions:  Yes, provided   Follow up plan: Follow up call scheduled for 11/20    Encounter Outcome:  Pt. Visit Completed   Valente David, RN, MSN, [place] Care Management Care Management Coordinator [phone]

## 2022-01-05 NOTE — Progress Notes (Unsigned)
I,Azzan Butler S Kennett Symes,acting as a Education administrator for Lavon Paganini, MD.,have documented all relevant documentation on the behalf of Lavon Paganini, MD,as directed by  Lavon Paganini, MD while in the presence of Lavon Paganini, MD.     Established patient visit   Patient: Luke Rojas   DOB: 06-14-1945   76 y.o. Male  MRN: [id number] Visit Date: 01/08/2022  Today's healthcare provider: Lavon Paganini, MD   Chief Complaint  Patient presents with   Follow-up   Subjective    HPI  Follow up for shortness of breath  The patient was last seen for this 1 weeks ago. Changes made at last visit include instructed patient to take ramipril daily. D/C amlodipine  He reports excellent compliance with treatment. He feels that condition is Worse. He is not having side effects.   ----------------------------------------------------------------------------------------- Diabetes Mellitus Type II, Follow-up  Lab Results  Component Value Date   HGBA1C 5.6 07/17/2021   HGBA1C 7.0 (H) 04/04/2021   HGBA1C 6.3 (H) 12/04/2019   Wt Readings from Last 3 Encounters:  01/08/22 155 lb (70.3 kg)  01/08/22 152 lb (68.9 kg)  01/01/22 152 lb (68.9 kg)   Last seen for diabetes 5 months ago.  Management since then includes no changes. He reports excellent compliance with treatment. He is not having side effects.  Symptoms: Yes fatigue No foot ulcerations  No appetite changes No nausea  No paresthesia of the feet  No polydipsia  No polyuria No visual disturbances   No vomiting     Home blood sugar records: fasting range: 90-130s  Episodes of hypoglycemia? No    Current insulin regiment: none Most Recent Eye Exam: will schedule at [facility]  Current exercise: none Current diet habits: in general, a "healthy" diet    Pertinent Labs: Lab Results  Component Value Date   CHOL 122 04/04/2021   HDL 30 (L) 04/04/2021   LDLCALC 63 04/04/2021   TRIG 170 (H) 04/04/2021   CHOLHDL  4.1 04/04/2021   Lab Results  Component Value Date   NA 137 01/08/2022   K 3.2 (L) 01/08/2022   CREATININE 0.93 01/08/2022   GFRNONAA >60 01/08/2022   LABMICR 13.8 07/17/2021     ---------------------------------------------------------------------------------------------------   Medications: Outpatient Medications Prior to Visit  Medication Sig   aspirin EC 81 MG tablet Take 1 tablet (81 mg total) by mouth daily. Swallow whole.   atorvastatin (LIPITOR) 80 MG tablet TAKE 1 TABLET BY MOUTH EVERY NIGHT AT BEDTIME   clopidogrel (PLAVIX) 75 MG tablet Take 75 mg by mouth daily.   co-enzyme Q-10 30 MG capsule Take 30 mg by mouth daily.   Cranberry-Vitamin C-Probiotic (AZO CRANBERRY PO) Take 2 tablets by mouth daily at 12 noon.   diclofenac sodium (VOLTAREN) 1 % GEL Apply 2 g topically 4 (four) times daily.   Krill Oil 1000 MG CAPS Take 1,000 mg by mouth daily.    metFORMIN (GLUCOPHAGE-XR) 750 MG 24 hr tablet TAKE 1 TABLET BY MOUTH ONCE A DAY WITH BREAKFAST   metoprolol tartrate (LOPRESSOR) 25 MG tablet Take 1 tablet (25 mg total) by mouth 2 (two) times daily. (Patient taking differently: Take 25 mg by mouth daily at 12 noon.)   MULTIPLE VITAMIN PO Take 1 tablet by mouth daily.   OneTouch Delica Lancets 55D MISC TEST FASTING GLUCOSE LEVEL EACH MORNING BEFORE BREAKFAST   pantoprazole (PROTONIX) 40 MG tablet Take 40 mg by mouth daily.   potassium chloride SA (KLOR-CON M) 20 MEQ tablet Take 1 tablet (20  mEq total) by mouth daily.   ramipril (ALTACE) 10 MG capsule Take 1 capsule (10 mg total) by mouth daily.   tamsulosin (FLOMAX) 0.4 MG CAPS capsule Take 0.4 mg by mouth daily.   oxyCODONE (OXY IR/ROXICODONE) 5 MG immediate release tablet Take 1 tablet (5 mg total) by mouth every 6 (six) hours as needed for severe pain. (Patient not taking: Reported on 01/08/2022)   No facility-administered medications prior to visit.    Review of Systems  Constitutional:  Negative for chills and fever.   HENT:  Negative for ear pain, rhinorrhea, sinus pressure, sinus pain and sore throat.   Respiratory:  Positive for cough, shortness of breath and wheezing.   Gastrointestinal:  Negative for abdominal pain, nausea and vomiting.       Objective    BP 107/71 (BP Location: Left Arm, Patient Position: Sitting, Cuff Size: Large)   Pulse 93   Temp 97.9 F (36.6 C) (Oral)   Resp 16   Wt 155 lb (70.3 kg)   SpO2 99%   BMI 25.79 kg/m  BP Readings from Last 3 Encounters:  01/08/22 107/71  01/08/22 118/70  01/01/22 (!) 84/48   Wt Readings from Last 3 Encounters:  01/08/22 155 lb (70.3 kg)  01/08/22 152 lb (68.9 kg)  01/01/22 152 lb (68.9 kg)      Physical Exam Vitals reviewed.  Constitutional:      General: He is not in acute distress.    Appearance: Normal appearance. He is not diaphoretic.  HENT:     Head: Normocephalic and atraumatic.  Eyes:     General: No scleral icterus.    Conjunctiva/sclera: Conjunctivae normal.  Cardiovascular:     Rate and Rhythm: Normal rate and regular rhythm.     Heart sounds: Normal heart sounds. No murmur heard. Pulmonary:     Effort: Pulmonary effort is normal. No respiratory distress.     Breath sounds: Normal breath sounds. No wheezing or rhonchi.  Musculoskeletal:     Cervical back: Neck supple.     Right lower leg: No edema.     Left lower leg: No edema.  Lymphadenopathy:     Cervical: No cervical adenopathy.  Skin:    General: Skin is warm and dry.     Findings: No rash.  Neurological:     Mental Status: He is alert and oriented to person, place, and time. Mental status is at baseline.  Psychiatric:        Mood and Affect: Mood normal.        Behavior: Behavior normal.       Results for orders placed or performed in visit on 01/08/22  Comprehensive metabolic panel  Result Value Ref Range   Sodium 137 135 - 145 mmol/L   Potassium 3.2 (L) 3.5 - 5.1 mmol/L   Chloride 107 98 - 111 mmol/L   CO2 22 22 - 32 mmol/L   Glucose,  Bld 118 (H) 70 - 99 mg/dL   BUN 10 8 - 23 mg/dL   Creatinine, Ser 0.93 0.61 - 1.24 mg/dL   Calcium 8.6 (L) 8.9 - 10.3 mg/dL   Total Protein 6.5 6.5 - 8.1 g/dL   Albumin 3.1 (L) 3.5 - 5.0 g/dL   AST 52 (H) 15 - 41 U/L   ALT 51 (H) 0 - 44 U/L   Alkaline Phosphatase 88 38 - 126 U/L   Total Bilirubin 0.4 0.3 - 1.2 mg/dL   GFR, Estimated >60 >60 mL/min   Anion gap  8 5 - 15  CBC with Differential/Platelet  Result Value Ref Range   WBC 6.3 4.0 - 10.5 K/uL   RBC 3.57 (L) 4.22 - 5.81 MIL/uL   Hemoglobin 10.6 (L) 13.0 - 17.0 g/dL   HCT 32.9 (L) 39.0 - 52.0 %   MCV 92.2 80.0 - 100.0 fL   MCH 29.7 26.0 - 34.0 pg   MCHC 32.2 30.0 - 36.0 g/dL   RDW 18.6 (H) 11.5 - 15.5 %   Platelets 260 150 - 400 K/uL   nRBC 0.0 0.0 - 0.2 %   Neutrophils Relative % 76 %   Neutro Abs 4.8 1.7 - 7.7 K/uL   Lymphocytes Relative 10 %   Lymphs Abs 0.6 (L) 0.7 - 4.0 K/uL   Monocytes Relative 10 %   Monocytes Absolute 0.6 0.1 - 1.0 K/uL   Eosinophils Relative 4 %   Eosinophils Absolute 0.2 0.0 - 0.5 K/uL   Basophils Relative 0 %   Basophils Absolute 0.0 0.0 - 0.1 K/uL   Immature Granulocytes 0 %   Abs Immature Granulocytes 0.02 0.00 - 0.07 K/uL    Assessment & Plan     Problem List Items Addressed This Visit       Cardiovascular and Mediastinum   Hypertension associated with diabetes ([place])    Blood pressure low normal, but orthostatic symptoms have resolved Continue ramipril daily, not twice daily No longer on amlodipine No further changes in medications Follow-up in 3 months        Respiratory   Malignant neoplasm of upper lobe of right lung (HCC)    Followed by oncology On chemotherapy and radiation Continues to have a cough, but lung sounds clear today Discussed with patient that his cancer and the treatment may be related to his chronic cough        Endocrine   Diabetes mellitus without complication (HCC)    Chronic and stable No known hypoglycemia, but his A1c is low at 5.1  today Think we are overtreating him and with his advanced age and other comorbidities, we would like his A1c to be a little closer to 7 We will discontinue metformin at this time Follow-up in 3 months      Other Visit Diagnoses     Type 2 diabetes mellitus with other specified complication, without long-term current use of insulin ([place])    -  Primary   Relevant Orders   POCT glycosylated hemoglobin (Hb A1C) (Completed)   Need for immunization against influenza       Relevant Orders   Flu Vaccine QUAD High Dose(Fluad) (Completed)        No follow-ups on file.      I, Lavon Paganini, MD, have reviewed all documentation for this visit. The documentation on 01/08/22 for the exam, diagnosis, procedures, and orders are all accurate and complete.   Bacigalupo, Dionne Bucy, MD, MPH [facility]

## 2022-01-08 ENCOUNTER — Inpatient Hospital Stay (HOSPITAL_BASED_OUTPATIENT_CLINIC_OR_DEPARTMENT_OTHER): Payer: Medicare Other | Admitting: Hospice and Palliative Medicine

## 2022-01-08 ENCOUNTER — Encounter: Payer: Self-pay | Admitting: Family Medicine

## 2022-01-08 ENCOUNTER — Ambulatory Visit (INDEPENDENT_AMBULATORY_CARE_PROVIDER_SITE_OTHER): Payer: Medicare Other | Admitting: Family Medicine

## 2022-01-08 ENCOUNTER — Inpatient Hospital Stay: Payer: Medicare Other | Attending: Oncology

## 2022-01-08 ENCOUNTER — Other Ambulatory Visit: Payer: Self-pay

## 2022-01-08 ENCOUNTER — Encounter: Payer: Self-pay | Admitting: Hospice and Palliative Medicine

## 2022-01-08 VITALS — BP 118/70 | HR 93 | Temp 97.0°F | Ht 65.0 in | Wt 152.0 lb

## 2022-01-08 VITALS — BP 107/71 | HR 93 | Temp 97.9°F | Resp 16 | Wt 155.0 lb

## 2022-01-08 DIAGNOSIS — E876 Hypokalemia: Secondary | ICD-10-CM | POA: Diagnosis not present

## 2022-01-08 DIAGNOSIS — C3411 Malignant neoplasm of upper lobe, right bronchus or lung: Secondary | ICD-10-CM

## 2022-01-08 DIAGNOSIS — E1169 Type 2 diabetes mellitus with other specified complication: Secondary | ICD-10-CM | POA: Diagnosis not present

## 2022-01-08 DIAGNOSIS — Z452 Encounter for adjustment and management of vascular access device: Secondary | ICD-10-CM | POA: Diagnosis not present

## 2022-01-08 DIAGNOSIS — I152 Hypertension secondary to endocrine disorders: Secondary | ICD-10-CM | POA: Diagnosis not present

## 2022-01-08 DIAGNOSIS — K8020 Calculus of gallbladder without cholecystitis without obstruction: Secondary | ICD-10-CM | POA: Diagnosis not present

## 2022-01-08 DIAGNOSIS — Z23 Encounter for immunization: Secondary | ICD-10-CM

## 2022-01-08 DIAGNOSIS — R7401 Elevation of levels of liver transaminase levels: Secondary | ICD-10-CM | POA: Insufficient documentation

## 2022-01-08 DIAGNOSIS — E1159 Type 2 diabetes mellitus with other circulatory complications: Secondary | ICD-10-CM

## 2022-01-08 DIAGNOSIS — E119 Type 2 diabetes mellitus without complications: Secondary | ICD-10-CM

## 2022-01-08 DIAGNOSIS — Z95828 Presence of other vascular implants and grafts: Secondary | ICD-10-CM

## 2022-01-08 DIAGNOSIS — R531 Weakness: Secondary | ICD-10-CM | POA: Insufficient documentation

## 2022-01-08 LAB — CBC WITH DIFFERENTIAL/PLATELET
Abs Immature Granulocytes: 0.02 10*3/uL (ref 0.00–0.07)
Basophils Absolute: 0 10*3/uL (ref 0.0–0.1)
Basophils Relative: 0 %
Eosinophils Absolute: 0.2 10*3/uL (ref 0.0–0.5)
Eosinophils Relative: 4 %
HCT: 32.9 % — ABNORMAL LOW (ref 39.0–52.0)
Hemoglobin: 10.6 g/dL — ABNORMAL LOW (ref 13.0–17.0)
Immature Granulocytes: 0 %
Lymphocytes Relative: 10 %
Lymphs Abs: 0.6 10*3/uL — ABNORMAL LOW (ref 0.7–4.0)
MCH: 29.7 pg (ref 26.0–34.0)
MCHC: 32.2 g/dL (ref 30.0–36.0)
MCV: 92.2 fL (ref 80.0–100.0)
Monocytes Absolute: 0.6 10*3/uL (ref 0.1–1.0)
Monocytes Relative: 10 %
Neutro Abs: 4.8 10*3/uL (ref 1.7–7.7)
Neutrophils Relative %: 76 %
Platelets: 260 10*3/uL (ref 150–400)
RBC: 3.57 MIL/uL — ABNORMAL LOW (ref 4.22–5.81)
RDW: 18.6 % — ABNORMAL HIGH (ref 11.5–15.5)
WBC: 6.3 10*3/uL (ref 4.0–10.5)
nRBC: 0 % (ref 0.0–0.2)

## 2022-01-08 LAB — COMPREHENSIVE METABOLIC PANEL
ALT: 51 U/L — ABNORMAL HIGH (ref 0–44)
AST: 52 U/L — ABNORMAL HIGH (ref 15–41)
Albumin: 3.1 g/dL — ABNORMAL LOW (ref 3.5–5.0)
Alkaline Phosphatase: 88 U/L (ref 38–126)
Anion gap: 8 (ref 5–15)
BUN: 10 mg/dL (ref 8–23)
CO2: 22 mmol/L (ref 22–32)
Calcium: 8.6 mg/dL — ABNORMAL LOW (ref 8.9–10.3)
Chloride: 107 mmol/L (ref 98–111)
Creatinine, Ser: 0.93 mg/dL (ref 0.61–1.24)
GFR, Estimated: >60 mL/min (ref >60)
Glucose, Bld: 118 mg/dL — ABNORMAL HIGH (ref 70–99)
Potassium: 3.2 mmol/L — ABNORMAL LOW (ref 3.5–5.1)
Sodium: 137 mmol/L (ref 135–145)
Total Bilirubin: 0.4 mg/dL (ref 0.3–1.2)
Total Protein: 6.5 g/dL (ref 6.5–8.1)

## 2022-01-08 LAB — POCT GLYCOSYLATED HEMOGLOBIN (HGB A1C)
Est. average glucose Bld gHb Est-mCnc: 100
Hemoglobin A1C: 5.1 % (ref 4.0–5.6)

## 2022-01-08 MED ORDER — POTASSIUM CHLORIDE CRYS ER 20 MEQ PO TBCR: 20.0000 meq | EXTENDED_RELEASE_TABLET | Freq: Every day | ORAL | 0 refills | Status: DC

## 2022-01-08 MED ORDER — SODIUM CHLORIDE 0.9% FLUSH
10.0000 mL | Freq: Once | INTRAVENOUS | Status: AC
  Administered 2022-01-08: 10 mL via INTRAVENOUS
  Filled 2022-01-08: qty 10

## 2022-01-08 MED ORDER — HEPARIN SOD (PORK) LOCK FLUSH 100 UNIT/ML IV SOLN
500.0000 [IU] | Freq: Once | INTRAVENOUS | Status: AC
  Administered 2022-01-08: 500 [IU] via INTRAVENOUS
  Filled 2022-01-08: qty 5

## 2022-01-08 NOTE — Assessment & Plan Note (Signed)
Blood pressure low normal, but orthostatic symptoms have resolved Continue ramipril daily, not twice daily No longer on amlodipine No further changes in medications Follow-up in 3 months

## 2022-01-08 NOTE — Assessment & Plan Note (Signed)
Followed by oncology On chemotherapy and radiation Continues to have a cough, but lung sounds clear today Discussed with patient that his cancer and the treatment may be related to his chronic cough

## 2022-01-08 NOTE — Progress Notes (Signed)
Symptom Management [place] at [facility] Telephone:([phone]) [phone] Fax:([phone]) [phone]  Patient Care Team: Virginia Crews, MD as PCP - General (Family Medicine) Thelma Comp, Brady as Consulting Physician (Optometry) Royston Cowper, MD as Consulting Physician (Urology) Yolonda Kida, MD as Consulting Physician (Cardiology) Telford Nab, RN as Oncology Nurse Navigator Valente David, RN as [place] [facility]   NAME OF PATIENT: Luke Rojas  [id number]  April 10, 1945   DATE OF VISIT: 01/08/22  REASON FOR CONSULT: Luke Rojas is a 76 y.o. male with multiple medical problems including stage II squamous cell lung cancer, prior history of smoking, history of spinal stenosis.   Patient is s/p CarboTaxol chemotherapy.  INTERVAL HISTORY: Patient last saw Dr. Janese Banks on 12/09/2018.  CT of the chest, abdomen, and pelvis on 12/05/2021 was suggestive of reduction in size of the right upper lobe lung mass and no other evidence of progressive disease.  Moderate biliary distention suggestive of choledocholithiasis but patient was asymptomatic and LFTs were normal.  MRCP on 12/15/2021 showed mild bile duct dilation with 3 small gallstones near the ampulla but none in the gallbladder.  At last visit with Dr. Janese Banks, patient was felt to be quite frail and had some ongoing shortness of breath.  There was no plan to offer maintenance immunotherapy and plan was for surveillance.  Patient subsequently had follow-up with PCP and was treated for COPD exacerbation.  Chest x-ray on 10/16 did not show any acute cardiopulmonary disease.  Patient was scheduled for [facility] today for interval follow-up on any symptomatic complaints.  Today, patient reports he is doing about the same.  He says that he has had ongoing shortness of breath over the past couple of weeks without significant improvement or worsening in condition.  He is still ambulatory and  dyspnea mostly occurs with exertion.  He did have a fall last week on the while ambulating towards the couch but did not sustain any injury.  He denies fever or chills.  No new or worsening symptoms.  No abdominal pain, nausea, vomiting, diarrhea.  Patient feels like appetite has improved.  Denies any neurologic complaints. Denies recent fevers or illnesses. Denies any easy bleeding or bruising.  Denies urinary complaints. Patient offers no further specific complaints today.   PAST MEDICAL HISTORY: Past Medical History:  Diagnosis Date   Arthritis    OSTEOARTHRITIS   Chronic kidney disease    kidney stones   COPD (chronic obstructive pulmonary disease) (HCC)    Coronary artery disease    2 stents    Diabetes mellitus without complication (HCC)    GERD (gastroesophageal reflux disease)    Hypercholesteremia    Hypertension    Lung cancer ([place])    Myocardial infarction ([place]) 7209   Renal colic     PAST SURGICAL HISTORY:  Past Surgical History:  Procedure Laterality Date   CARDIAC CATHETERIZATION  2013   2 stents   CAROTID PTA/STENT INTERVENTION Right 09/11/2021   Procedure: CAROTID PTA/STENT INTERVENTION;  Surgeon: Algernon Huxley, MD;  Location: [place] [facility];  Service: Cardiovascular;  Laterality: Right;   CATARACT EXTRACTION, BILATERAL     COLONOSCOPY  04/27/05   COLONOSCOPY WITH PROPOFOL N/A 06/08/2015   Procedure: COLONOSCOPY WITH PROPOFOL;  Surgeon: Robert Bellow, MD;  Location: [facility];  Service: Endoscopy;  Laterality: N/A;   CYSTOSCOPY WITH INSERTION OF UROLIFT N/A 02/11/2020   Procedure: CYSTOSCOPY WITH INSERTION OF UROLIFT;  Surgeon: Royston Cowper, MD;  Location: [facility];  Service: Urology;  Laterality: N/A;   CYSTOSCOPY WITH STENT PLACEMENT Right 09/16/2016   Procedure: CYSTOSCOPY WITH STENT PLACEMENT;  Surgeon: Royston Cowper, MD;  Location: [facility];  Service: Urology;  Laterality: Right;   EXTRACORPOREAL SHOCK WAVE LITHOTRIPSY Left 11/04/2014   has  had 2 previous lithotripsies   EXTRACORPOREAL SHOCK WAVE LITHOTRIPSY Left 03/24/2015   Procedure: EXTRACORPOREAL SHOCK WAVE LITHOTRIPSY (ESWL);  Surgeon: Royston Cowper, MD;  Location: [facility] OR[facility];  Service: Urology;  Laterality: Left;   EXTRACORPOREAL SHOCK WAVE LITHOTRIPSY Right 10/04/2016   Procedure: EXTRACORPOREAL SHOCK WAVE LITHOTRIPSY (ESWL);  Surgeon: Royston Cowper, MD;  Location: [facility];  Service: Urology;  Laterality: Right;   LEFT HEART CATH AND CORONARY ANGIOGRAPHY N/A 12/05/2018   Procedure: LEFT HEART CATH AND CORONARY ANGIOGRAPHY with possible pci and stent;  Surgeon: Yolonda Kida, MD;  Location: [place] [facility];  Service: Cardiovascular;  Laterality: N/A;   PORTA CATH INSERTION Right 09/25/2021   Procedure: PORTA CATH INSERTION;  Surgeon: Algernon Huxley, MD;  Location: [place] [facility];  Service: Cardiovascular;  Laterality: Right;    HEMATOLOGY/ONCOLOGY HISTORY:  Oncology History  Malignant neoplasm of upper lobe of right lung ([place])  09/18/2021 Initial Diagnosis   Malignant neoplasm of upper lobe of right lung ([place])   09/18/2021 Cancer Staging   Staging form: Lung, AJCC 8th Edition - Clinical stage from 09/18/2021: Stage IIB (cT2, cN1, cM0) - Signed by Sindy Guadeloupe, MD on 09/18/2021 Histopathologic type: Squamous cell carcinoma, NOS   10/02/2021 - 10/23/2021 Chemotherapy   Patient is on Treatment Plan : LUNG Carboplatin / Paclitaxel + XRT q7d     10/30/2021 - 11/15/2021 Chemotherapy   Patient is on Treatment Plan : LUNG Carboplatin + Paclitaxel + XRT q7d       ALLERGIES:  is allergic to codeine.  MEDICATIONS:  Current Outpatient Medications  Medication Sig Dispense Refill   aspirin EC 81 MG tablet Take 1 tablet (81 mg total) by mouth daily. Swallow whole. 30 tablet 12   atorvastatin (LIPITOR) 80 MG tablet TAKE 1 TABLET BY MOUTH EVERY NIGHT AT BEDTIME 90 tablet 3   clopidogrel (PLAVIX) 75 MG tablet Take 75 mg by mouth daily.     co-enzyme Q-10 30 MG capsule  Take 30 mg by mouth daily.     diclofenac sodium (VOLTAREN) 1 % GEL Apply 2 g topically 4 (four) times daily. 100 g 3   docusate sodium (COLACE) 100 MG capsule Take 1 capsule (100 mg total) by mouth 2 (two) times daily. 60 capsule 0   Krill Oil 1000 MG CAPS Take 1,000 mg by mouth daily.      metFORMIN (GLUCOPHAGE-XR) 750 MG 24 hr tablet TAKE 1 TABLET BY MOUTH ONCE A DAY WITH BREAKFAST 90 tablet 1   metoprolol tartrate (LOPRESSOR) 25 MG tablet Take 1 tablet (25 mg total) by mouth 2 (two) times daily. 60 tablet 2   MULTIPLE VITAMIN PO Take 1 tablet by mouth daily.     OneTouch Delica Lancets 27X MISC TEST FASTING GLUCOSE LEVEL EACH MORNING BEFORE BREAKFAST 100 each 4   oxyCODONE (OXY IR/ROXICODONE) 5 MG immediate release tablet Take 1 tablet (5 mg total) by mouth every 6 (six) hours as needed for severe pain. 30 tablet 0   pantoprazole (PROTONIX) 20 MG tablet Take 2 tablets (40 mg total) by mouth daily. 30 tablet 2   pantoprazole (PROTONIX) 40 MG tablet Take 40 mg by mouth daily.  potassium chloride SA (KLOR-CON M) 20 MEQ tablet Take 1 tablet (20 mEq total) by mouth daily. 7 tablet 0   prochlorperazine (COMPAZINE) 10 MG tablet TAKE 1 TABLET BY MOUTH EVERY 6 HOURS AS NEEDED (NAUSEA OR VOMITING) 30 tablet 1   Pumpkin Seed-Soy Germ (AZO BLADDER CONTROL/GO-LESS PO) Take by mouth.     ramipril (ALTACE) 10 MG capsule Take 1 capsule (10 mg total) by mouth daily. 30 capsule 2   ramipril (ALTACE) 10 MG capsule Take 10 mg by mouth daily.     sucralfate (CARAFATE) 1 g tablet Take 1 tablet (1 g total) by mouth 4 (four) times daily as needed (reflux). 60 tablet 0   tamsulosin (FLOMAX) 0.4 MG CAPS capsule Take 0.4 mg by mouth daily.     No current facility-administered medications for this visit.    VITAL SIGNS: There were no vitals taken for this visit. There were no vitals filed for this visit.  Estimated body mass index is 25.29 kg/m as calculated from the following:   Height as of 12/26/21: 5\' 5"   (1.651 m).   Weight as of 01/01/22: 152 lb (68.9 kg).  LABS: CBC:    Component Value Date/Time   WBC 5.4 12/08/2021 0853   HGB 9.2 (L) 12/08/2021 0853   HGB 14.1 12/04/2019 0810   HCT 28.8 (L) 12/08/2021 0853   HCT 42.4 12/04/2019 0810   PLT 169 12/08/2021 0853   PLT 186 12/04/2019 0810   MCV 89.2 12/08/2021 0853   MCV 89 12/04/2019 0810   MCV 88 10/02/2011 1051   NEUTROABS 3.4 12/08/2021 0853   NEUTROABS 8.9 (H) 12/04/2019 0810   LYMPHSABS 0.5 (L) 12/08/2021 0853   LYMPHSABS 3.2 (H) 12/04/2019 0810   MONOABS 0.8 12/08/2021 0853   EOSABS 0.6 (H) 12/08/2021 0853   EOSABS 0.0 12/04/2019 0810   BASOSABS 0.0 12/08/2021 0853   BASOSABS 0.0 12/04/2019 0810   Comprehensive Metabolic Panel:    Component Value Date/Time   NA 133 (L) 12/08/2021 0853   NA 139 04/04/2021 0926   NA 139 10/02/2011 1051   K 3.6 12/08/2021 0853   K 4.2 10/02/2011 1051   CL 106 12/08/2021 0853   CL 106 10/02/2011 1051   CO2 22 12/08/2021 0853   CO2 27 10/02/2011 1051   BUN 16 12/08/2021 0853   BUN 19 04/04/2021 0926   BUN 21 (H) 10/02/2011 1051   CREATININE 1.17 12/08/2021 0853   CREATININE 1.88 (H) 10/02/2011 1051   GLUCOSE 120 (H) 12/08/2021 0853   GLUCOSE 97 10/02/2011 1051   CALCIUM 8.3 (L) 12/08/2021 0853   CALCIUM 8.4 (L) 10/02/2011 1051   AST 32 12/08/2021 0853   AST 25 09/21/2011 1245   ALT 29 12/08/2021 0853   ALT 34 09/21/2011 1245   ALKPHOS 76 12/08/2021 0853   ALKPHOS 97 09/21/2011 1245   BILITOT 0.5 12/08/2021 0853   BILITOT 0.5 04/04/2021 0926   BILITOT 0.6 09/21/2011 1245   PROT 6.5 12/08/2021 0853   PROT 6.6 04/04/2021 0926   PROT 7.7 09/21/2011 1245   ALBUMIN 3.2 (L) 12/08/2021 0853   ALBUMIN 4.0 04/04/2021 0926   ALBUMIN 4.0 09/21/2011 1245    RADIOGRAPHIC STUDIES: DG Chest 2 View  Result Date: 12/19/2021 CLINICAL DATA:  Cough and wheezing. Chest pain and shortness of breath for 2-3 months. History of lung carcinoma. EXAM: CHEST - 2 VIEW COMPARISON:  10/30/2021.   CT, 12/05/2021. FINDINGS: Cardiac silhouette is normal in size. Left coronary artery stent. No mediastinal or hilar  masses. No evidence of adenopathy. Right anterior chest wall Port-A-Cath is stable, tip in the lower superior vena cava. Wedge-shaped opacity in the lateral, lower right upper lobe, consistent with the area of treated lung carcinoma, unchanged. Mild bilateral interstitial thickening. Additional linear scarring in the right mid lung. These findings are also stable. No evidence of pneumonia or pulmonary edema. No pleural effusion or pneumothorax. Skeletal structures are intact. IMPRESSION: 1. No acute cardiopulmonary disease. 2. Stable appearance from the previous chest radiograph. Electronically Signed   By: Lajean Manes M.D.   On: 12/19/2021 15:14   MR ABDOMEN MRCP W WO CONTAST  Result Date: 12/17/2021 CLINICAL DATA:  Abnormal CT, biliary ductal dilatation, suspected choledocholithiasis, pancreatitis, lung cancer EXAM: MRI ABDOMEN WITHOUT AND WITH CONTRAST (INCLUDING MRCP) TECHNIQUE: Multiplanar multisequence MR imaging of the abdomen was performed both before and after the administration of intravenous contrast. Heavily T2-weighted images of the biliary and pancreatic ducts were obtained, and three-dimensional MRCP images were rendered by post processing. CONTRAST:  7.5mL GADAVIST GADOBUTROL 1 MMOL/ML IV SOLN COMPARISON:  CT chest abdomen pelvis, 12/05/2021 FINDINGS: Lower chest: No acute abnormality. Hepatobiliary: No solid liver abnormality is seen. Mild common bile duct dilatation, measuring up to 0.9 cm in caliber. There are three small gallstones near the ampulla measuring up to 0.3 cm (series 3, image 22). No gallstones in the gallbladder. Pancreas: Unremarkable. No pancreatic ductal dilatation or surrounding inflammatory changes. Spleen: Normal in size without significant abnormality. Adrenals/Urinary Tract: Adrenal glands are unremarkable. Simple, benign bilateral renal cortical cysts,  for which no further follow-up or characterization is required. Kidneys are otherwise normal, without renal calculi, solid lesion, or hydronephrosis. Stomach/Bowel: Stomach is within normal limits. No evidence of bowel wall thickening, distention, or inflammatory changes. Vascular/Lymphatic: Aortic atherosclerosis. No enlarged abdominal lymph nodes. Other: No abdominal wall hernia or abnormality. No ascites. Musculoskeletal: No acute or significant osseous findings. IMPRESSION: 1. Mild common bile duct dilatation, measuring up to 0.9 cm in caliber. There are three small gallstones near the ampulla measuring up to 0.3 cm. No gallstones in the gallbladder. 2. No pericholecystic fluid or inflammatory findings in the abdomen. Aortic Atherosclerosis (ICD10-I70.0). Electronically Signed   By: Delanna Ahmadi M.D.   On: 12/17/2021 15:02   MR 3D Recon At Scanner  Result Date: 12/17/2021 CLINICAL DATA:  Abnormal CT, biliary ductal dilatation, suspected choledocholithiasis, pancreatitis, lung cancer EXAM: MRI ABDOMEN WITHOUT AND WITH CONTRAST (INCLUDING MRCP) TECHNIQUE: Multiplanar multisequence MR imaging of the abdomen was performed both before and after the administration of intravenous contrast. Heavily T2-weighted images of the biliary and pancreatic ducts were obtained, and three-dimensional MRCP images were rendered by post processing. CONTRAST:  7.34mL GADAVIST GADOBUTROL 1 MMOL/ML IV SOLN COMPARISON:  CT chest abdomen pelvis, 12/05/2021 FINDINGS: Lower chest: No acute abnormality. Hepatobiliary: No solid liver abnormality is seen. Mild common bile duct dilatation, measuring up to 0.9 cm in caliber. There are three small gallstones near the ampulla measuring up to 0.3 cm (series 3, image 22). No gallstones in the gallbladder. Pancreas: Unremarkable. No pancreatic ductal dilatation or surrounding inflammatory changes. Spleen: Normal in size without significant abnormality. Adrenals/Urinary Tract: Adrenal glands are  unremarkable. Simple, benign bilateral renal cortical cysts, for which no further follow-up or characterization is required. Kidneys are otherwise normal, without renal calculi, solid lesion, or hydronephrosis. Stomach/Bowel: Stomach is within normal limits. No evidence of bowel wall thickening, distention, or inflammatory changes. Vascular/Lymphatic: Aortic atherosclerosis. No enlarged abdominal lymph nodes. Other: No abdominal wall hernia or abnormality. No  ascites. Musculoskeletal: No acute or significant osseous findings. IMPRESSION: 1. Mild common bile duct dilatation, measuring up to 0.9 cm in caliber. There are three small gallstones near the ampulla measuring up to 0.3 cm. No gallstones in the gallbladder. 2. No pericholecystic fluid or inflammatory findings in the abdomen. Aortic Atherosclerosis (ICD10-I70.0). Electronically Signed   By: Delanna Ahmadi M.D.   On: 12/17/2021 15:02    PERFORMANCE STATUS (ECOG) : 2 - Symptomatic, <50% confined to bed  Review of Systems Unless otherwise noted, a complete review of systems is negative.  Physical Exam General: NAD Cardiovascular: regular rate and rhythm Pulmonary: clear ant fields Abdomen: soft, nontender, + bowel sounds GU: no suprapubic tenderness Extremities: no edema, no joint deformities Skin: no rashes Neurological: Weakness but otherwise nonfocal  IMPRESSION/PLAN: Shortness of breath -recent CT showed decreased size of right upper lobe mass without any acute changes.  Recent chest x-ray did not show any acute cardiopulmonary disease.  Patient was treated by PCP for COPD exacerbation and has follow-up scheduled with PCP this afternoon for reassessment.  May be reasonable to repeat chest x-ray or consider referral to pulmonary given ongoing symptoms.  Weakness -patient is ambulating with use of a walker.  Fall precautions reviewed.  Hypokalemia -restart oral KCl supplementation.  We will recheck labs in 2 weeks.  Transaminitis -recent  MRCP showed small nonobstructing gallstones.  Discussed with Dr. Janese Banks and patient is asymptomatic without abdominal pain or abdominal symptoms.  We will follow labs and consider referral to GI if needed.  RTC in 2 weeks for labs/SM[facility].  Case and plan discussed with Dr. Janese Banks  Patient expressed understanding and was in agreement with this plan. He also understands that He can call clinic at any time with any questions, concerns, or complaints.   Thank you for allowing me to participate in the care of this very pleasant patient.   Time Total: 20 minutes  Visit consisted of counseling and education dealing with the complex and emotionally intense issues of symptom management in the setting of serious illness.Greater than 50%  of this time was spent counseling and coordinating care related to the above assessment and plan.  Signed by: Altha Harm, PhD, NP-C

## 2022-01-08 NOTE — Assessment & Plan Note (Signed)
Chronic and stable No known hypoglycemia, but his A1c is low at 5.1 today Think we are overtreating him and with his advanced age and other comorbidities, we would like his A1c to be a little closer to 7 We will discontinue metformin at this time Follow-up in 3 months

## 2022-01-10 ENCOUNTER — Other Ambulatory Visit (INDEPENDENT_AMBULATORY_CARE_PROVIDER_SITE_OTHER): Payer: Self-pay | Admitting: Nurse Practitioner

## 2022-01-10 DIAGNOSIS — I6521 Occlusion and stenosis of right carotid artery: Secondary | ICD-10-CM

## 2022-01-15 ENCOUNTER — Ambulatory Visit (INDEPENDENT_AMBULATORY_CARE_PROVIDER_SITE_OTHER): Payer: Medicare Other | Admitting: Family Medicine

## 2022-01-15 ENCOUNTER — Ambulatory Visit: Payer: Self-pay

## 2022-01-15 VITALS — BP 98/61 | HR 98 | Temp 98.2°F | Resp 20 | Wt 159.0 lb

## 2022-01-15 DIAGNOSIS — C3411 Malignant neoplasm of upper lobe, right bronchus or lung: Secondary | ICD-10-CM | POA: Diagnosis not present

## 2022-01-15 DIAGNOSIS — R0602 Shortness of breath: Secondary | ICD-10-CM | POA: Diagnosis not present

## 2022-01-15 DIAGNOSIS — R918 Other nonspecific abnormal finding of lung field: Secondary | ICD-10-CM | POA: Diagnosis not present

## 2022-01-15 MED ORDER — BENZONATATE 100 MG PO CAPS: 100.0000 mg | ORAL_CAPSULE | Freq: Two times a day (BID) | ORAL | 0 refills | Status: DC | PRN

## 2022-01-15 MED ORDER — ALBUTEROL SULFATE (2.5 MG/3ML) 0.083% IN NEBU: 2.5000 mg | INHALATION_SOLUTION | Freq: Four times a day (QID) | RESPIRATORY_TRACT | 1 refills | Status: DC | PRN

## 2022-01-15 NOTE — Assessment & Plan Note (Signed)
Continues to have uncontrolled SOB Non-exertional - occurs at rest as well Has had treatment for COPD exacerbation with no improvement and is not currently wheezing Is being treated for lung cancer and this may contribute Recent imaging without PNA or other acute findings Tessalon prn Will try to switch albuterol from MDI to nebs - unable to inhale deeply to make MDI effective If not improving, consider CTA (though no other signs of VTE) vs pulm referral

## 2022-01-15 NOTE — Progress Notes (Signed)
I,Sulibeya S Dimas,acting as a Education administrator for Lavon Paganini, MD.,have documented all relevant documentation on the behalf of Lavon Paganini, MD,as directed by  Lavon Paganini, MD while in the presence of Lavon Paganini, MD.     Established patient visit   Patient: Luke Rojas   DOB: 22-Aug-1945   76 y.o. Male  MRN: [id number] Visit Date: 01/15/2022  Today's healthcare provider: Lavon Paganini, MD   Chief Complaint  Patient presents with   Shortness of Breath   Subjective    Shortness of Breath Associated symptoms include wheezing. Pertinent negatives include no abdominal pain, chest pain, leg swelling or vomiting.    Follow up for shortness of breath  The patient was last seen for this 1 weeks ago. Changes made at last visit include no changes.  He reports excellent compliance with treatment. He feels that condition is Worse. Patient report worsening shortness of breath with activity and talking.    Cough is worsening. Worse SOB wit talking .  Non productive cough, no fever, no sore throat or rhinorrhea. Uses chloraseptic to keep throat from hurting. Tried tyleno land mucinex cough medicine with no improvement. -----------------------------------------------------------------------------------------   Medications: Outpatient Medications Prior to Visit  Medication Sig   aspirin EC 81 MG tablet Take 1 tablet (81 mg total) by mouth daily. Swallow whole.   atorvastatin (LIPITOR) 80 MG tablet TAKE 1 TABLET BY MOUTH EVERY NIGHT AT BEDTIME   clopidogrel (PLAVIX) 75 MG tablet Take 75 mg by mouth daily.   co-enzyme Q-10 30 MG capsule Take 30 mg by mouth daily.   Cranberry-Vitamin C-Probiotic (AZO CRANBERRY PO) Take 2 tablets by mouth daily at 12 noon.   diclofenac sodium (VOLTAREN) 1 % GEL Apply 2 g topically 4 (four) times daily.   Krill Oil 1000 MG CAPS Take 1,000 mg by mouth daily.    metoprolol tartrate (LOPRESSOR) 25 MG tablet Take 1 tablet (25 mg  total) by mouth 2 (two) times daily. (Patient taking differently: Take 25 mg by mouth daily at 12 noon.)   MULTIPLE VITAMIN PO Take 1 tablet by mouth daily.   OneTouch Delica Lancets 94T MISC TEST FASTING GLUCOSE LEVEL EACH MORNING BEFORE BREAKFAST   pantoprazole (PROTONIX) 40 MG tablet Take 40 mg by mouth daily.   potassium chloride SA (KLOR-CON M) 20 MEQ tablet Take 1 tablet (20 mEq total) by mouth daily.   ramipril (ALTACE) 10 MG capsule Take 1 capsule (10 mg total) by mouth daily.   tamsulosin (FLOMAX) 0.4 MG CAPS capsule Take 0.4 mg by mouth daily.   No facility-administered medications prior to visit.    Review of Systems  Constitutional:  Negative for appetite change.  Respiratory:  Positive for cough, shortness of breath and wheezing.   Cardiovascular:  Negative for chest pain and leg swelling.  Gastrointestinal:  Negative for abdominal pain, nausea and vomiting.       Objective    BP 98/61 (BP Location: Left Arm, Patient Position: Sitting, Cuff Size: Large)   Pulse 98   Temp 98.2 F (36.8 C) (Oral)   Resp 20   Wt 159 lb (72.1 kg)   SpO2 97%   BMI 26.46 kg/m  BP Readings from Last 3 Encounters:  01/15/22 98/61  01/08/22 107/71  01/08/22 118/70   Wt Readings from Last 3 Encounters:  01/15/22 159 lb (72.1 kg)  01/08/22 155 lb (70.3 kg)  01/08/22 152 lb (68.9 kg)      Physical Exam Vitals reviewed.  Constitutional:  General: He is not in acute distress.    Appearance: Normal appearance. He is not diaphoretic.  HENT:     Head: Normocephalic and atraumatic.  Eyes:     General: No scleral icterus.    Conjunctiva/sclera: Conjunctivae normal.  Cardiovascular:     Rate and Rhythm: Normal rate and regular rhythm.     Pulses: Normal pulses.     Heart sounds: Normal heart sounds. No murmur heard. Pulmonary:     Effort: Pulmonary effort is normal. No respiratory distress.     Breath sounds: Normal breath sounds. No wheezing or rhonchi.  Musculoskeletal:      Cervical back: Neck supple.     Right lower leg: No edema.     Left lower leg: No edema.  Lymphadenopathy:     Cervical: No cervical adenopathy.  Skin:    General: Skin is warm and dry.  Neurological:     Mental Status: He is alert and oriented to person, place, and time. Mental status is at baseline.  Psychiatric:        Mood and Affect: Mood normal.        Behavior: Behavior normal.       No results found for any visits on 01/15/22.  Assessment & Plan     Problem List Items Addressed This Visit       Respiratory   Malignant neoplasm of upper lobe of right lung ([place]) - Primary   Relevant Orders   For home use only DME Nebulizer machine     Other   Lung mass   Relevant Orders   For home use only DME Nebulizer machine   Shortness of breath    Continues to have uncontrolled SOB Non-exertional - occurs at rest as well Has had treatment for COPD exacerbation with no improvement and is not currently wheezing Is being treated for lung cancer and this may contribute Recent imaging without PNA or other acute findings Tessalon prn Will try to switch albuterol from MDI to nebs - unable to inhale deeply to make MDI effective If not improving, consider CTA (though no other signs of VTE) vs pulm referral      Relevant Orders   For home use only DME Nebulizer machine     Return if symptoms worsen or fail to improve.      I, Lavon Paganini, MD, have reviewed all documentation for this visit. The documentation on 01/15/22 for the exam, diagnosis, procedures, and orders are all accurate and complete.   Heleena Miceli, Dionne Bucy, MD, MPH [facility]

## 2022-01-15 NOTE — Telephone Encounter (Signed)
  Chief Complaint: Short of breath Symptoms: above Frequency: 4 weeks Pertinent Negatives: Patient denies fever Disposition: [] ED /[] Urgent Care (no appt availability in office) / [x] Appointment(In office/virtual)/ []  [facility]/ [] Home Care/ [] Refused Recommended Disposition /[] [facility]/ []  Follow-up with PCP Additional Notes: PT states he has been SOB since lat OV. PT cannot hold his breath long enough to use inhaler properly. Pt get SOB when talking. Currently being treated for lung ca.  Reason for Disposition  [1] Longstanding difficulty breathing (e.g., CHF, COPD, emphysema) AND [2] WORSE than normal  Answer Assessment - Initial Assessment Questions 1. RESPIRATORY STATUS: "Describe your breathing?" (e.g., wheezing, shortness of breath, unable to speak, severe coughing)      Shortness of breath 2. ONSET: "When did this breathing problem begin?"      weeks 3. PATTERN "Does the difficult breathing come and go, or has it been constant since it started?"      constant 4. SEVERITY: "How bad is your breathing?" (e.g., mild, moderate, severe)    - MILD: No SOB at rest, mild SOB with walking, speaks normally in sentences, can lie down, no retractions, pulse < 100.    - MODERATE: SOB at rest, SOB with minimal exertion and prefers to sit, cannot lie down flat, speaks in phrases, mild retractions, audible wheezing, pulse 100-120.    - SEVERE: Very SOB at rest, speaks in single words, struggling to breathe, sitting hunched forward, retractions, pulse > 120      moderate 5. RECURRENT SYMPTOM: "Have you had difficulty breathing before?" If Yes, ask: "When was the last time?" and "What happened that time?"      Been like this for a month - now worse 6. CARDIAC HISTORY: "Do you have any history of heart disease?" (e.g., heart attack, angina, bypass surgery, angioplasty)      Heart attack 7. LUNG HISTORY: "Do you have any history of lung disease?"  (e.g., pulmonary embolus,  asthma, emphysema)     Lung cancer 8. CAUSE: "What do you think is causing the breathing problem?"      unsure 9. OTHER SYMPTOMS: "Do you have any other symptoms? (e.g., dizziness, runny nose, cough, chest pain, fever)     Dizziness a couple weeks ago. Can feel it in his chest 10. O2 SATURATION MONITOR:  "Do you use an oxygen saturation monitor (pulse oximeter) at home?" If Yes, ask: "What is your reading (oxygen level) today?" "What is your usual oxygen saturation reading?" (e.g., 95%)       95 -98 11. PREGNANCY: "Is there any chance you are pregnant?" "When was your last menstrual period?"        12. TRAVEL: "Have you traveled out of the country in the last month?" (e.g., travel history, exposures)  Protocols used: Breathing Difficulty-A-AH

## 2022-01-16 ENCOUNTER — Ambulatory Visit (INDEPENDENT_AMBULATORY_CARE_PROVIDER_SITE_OTHER): Payer: Medicare Other

## 2022-01-16 ENCOUNTER — Encounter (INDEPENDENT_AMBULATORY_CARE_PROVIDER_SITE_OTHER): Payer: Self-pay | Admitting: Vascular Surgery

## 2022-01-16 ENCOUNTER — Ambulatory Visit (INDEPENDENT_AMBULATORY_CARE_PROVIDER_SITE_OTHER): Payer: Medicare Other | Admitting: Vascular Surgery

## 2022-01-16 VITALS — BP 95/61 | HR 100 | Resp 15 | Wt 157.0 lb

## 2022-01-16 DIAGNOSIS — E785 Hyperlipidemia, unspecified: Secondary | ICD-10-CM

## 2022-01-16 DIAGNOSIS — E119 Type 2 diabetes mellitus without complications: Secondary | ICD-10-CM

## 2022-01-16 DIAGNOSIS — E1169 Type 2 diabetes mellitus with other specified complication: Secondary | ICD-10-CM | POA: Diagnosis not present

## 2022-01-16 DIAGNOSIS — E1159 Type 2 diabetes mellitus with other circulatory complications: Secondary | ICD-10-CM

## 2022-01-16 DIAGNOSIS — C3411 Malignant neoplasm of upper lobe, right bronchus or lung: Secondary | ICD-10-CM | POA: Diagnosis not present

## 2022-01-16 DIAGNOSIS — I152 Hypertension secondary to endocrine disorders: Secondary | ICD-10-CM

## 2022-01-16 DIAGNOSIS — I6521 Occlusion and stenosis of right carotid artery: Secondary | ICD-10-CM

## 2022-01-16 NOTE — Assessment & Plan Note (Signed)
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  Doing well.  Continue current medical regimen.  Recheck in 6 months.

## 2022-01-16 NOTE — Progress Notes (Signed)
MRN : [id number]  Luke Rojas is a 76 y.o. (21-Apr-1945) male who presents with chief complaint of  Chief Complaint  Patient presents with   Follow-up    Ultrasound follow up  .  History of Present Illness: Patient returns today in follow up of carotid disease.  He is about 4 months status post right carotid stent placement for high-grade stenosis.  He is about 3 months status post a Port-A-Cath placement for treatment for his chemotherapy and venous access as he was diagnosed with lung cancer around that time as well.  The chemotherapy has hit him a lot harder than his carotid stent did.  He recovered from the carotid stent quite well.  His Port-A-Cath is working fine.  He denies any focal neurologic symptoms. Specifically, the patient denies amaurosis fugax, speech or swallowing difficulties, or arm or leg weakness or numbness Carotid duplex today shows the right carotid stent to be widely patent and the left carotid artery to have 1 to 39% ICA stenosis.  Current Outpatient Medications  Medication Sig Dispense Refill   albuterol (PROVENTIL) (2.5 MG/3ML) 0.083% nebulizer solution Take 3 mLs (2.5 mg total) by nebulization every 6 (six) hours as needed for wheezing or shortness of breath. 150 mL 1   aspirin EC 81 MG tablet Take 1 tablet (81 mg total) by mouth daily. Swallow whole. 30 tablet 12   atorvastatin (LIPITOR) 80 MG tablet TAKE 1 TABLET BY MOUTH EVERY NIGHT AT BEDTIME 90 tablet 3   benzonatate (TESSALON) 100 MG capsule Take 1 capsule (100 mg total) by mouth 2 (two) times daily as needed for cough. 20 capsule 0   clopidogrel (PLAVIX) 75 MG tablet Take 75 mg by mouth daily.     co-enzyme Q-10 30 MG capsule Take 30 mg by mouth daily.     Cranberry-Vitamin C-Probiotic (AZO CRANBERRY PO) Take 2 tablets by mouth daily at 12 noon.     diclofenac sodium (VOLTAREN) 1 % GEL Apply 2 g topically 4 (four) times daily. 100 g 3   Krill Oil 1000 MG CAPS Take 1,000 mg by mouth daily.       metoprolol tartrate (LOPRESSOR) 25 MG tablet Take 1 tablet (25 mg total) by mouth 2 (two) times daily. (Patient taking differently: Take 25 mg by mouth daily at 12 noon.) 60 tablet 2   MULTIPLE VITAMIN PO Take 1 tablet by mouth daily.     OneTouch Delica Lancets 94W MISC TEST FASTING GLUCOSE LEVEL EACH MORNING BEFORE BREAKFAST 100 each 4   pantoprazole (PROTONIX) 40 MG tablet Take 40 mg by mouth daily.     potassium chloride SA (KLOR-CON M) 20 MEQ tablet Take 1 tablet (20 mEq total) by mouth daily. 14 tablet 0   ramipril (ALTACE) 10 MG capsule Take 1 capsule (10 mg total) by mouth daily. 30 capsule 2   tamsulosin (FLOMAX) 0.4 MG CAPS capsule Take 0.4 mg by mouth daily.     No current facility-administered medications for this visit.    Past Medical History:  Diagnosis Date   Arthritis    OSTEOARTHRITIS   Chronic kidney disease    kidney stones   COPD (chronic obstructive pulmonary disease) (HCC)    Coronary artery disease    2 stents    Diabetes mellitus without complication (HCC)    GERD (gastroesophageal reflux disease)    Hypercholesteremia    Hypertension    Lung cancer ([place])    Myocardial infarction [facility]) 5462   Renal colic  Past Surgical History:  Procedure Laterality Date   CARDIAC CATHETERIZATION  2013   2 stents   CAROTID PTA/STENT INTERVENTION Right 09/11/2021   Procedure: CAROTID PTA/STENT INTERVENTION;  Surgeon: Algernon Huxley, MD;  Location: [place] [facility];  Service: Cardiovascular;  Laterality: Right;   CATARACT EXTRACTION, BILATERAL     COLONOSCOPY  04/27/05   COLONOSCOPY WITH PROPOFOL N/A 06/08/2015   Procedure: COLONOSCOPY WITH PROPOFOL;  Surgeon: Robert Bellow, MD;  Location: [facility];  Service: Endoscopy;  Laterality: N/A;   CYSTOSCOPY WITH INSERTION OF UROLIFT N/A 02/11/2020   Procedure: CYSTOSCOPY WITH INSERTION OF UROLIFT;  Surgeon: Royston Cowper, MD;  Location: [facility];  Service: Urology;  Laterality: N/A;   CYSTOSCOPY WITH STENT  PLACEMENT Right 09/16/2016   Procedure: CYSTOSCOPY WITH STENT PLACEMENT;  Surgeon: Royston Cowper, MD;  Location: [facility];  Service: Urology;  Laterality: Right;   EXTRACORPOREAL SHOCK WAVE LITHOTRIPSY Left 11/04/2014   has had 2 previous lithotripsies   EXTRACORPOREAL SHOCK WAVE LITHOTRIPSY Left 03/24/2015   Procedure: EXTRACORPOREAL SHOCK WAVE LITHOTRIPSY (ESWL);  Surgeon: Royston Cowper, MD;  Location: [facility];  Service: Urology;  Laterality: Left;   EXTRACORPOREAL SHOCK WAVE LITHOTRIPSY Right 10/04/2016   Procedure: EXTRACORPOREAL SHOCK WAVE LITHOTRIPSY (ESWL);  Surgeon: Royston Cowper, MD;  Location: [facility];  Service: Urology;  Laterality: Right;   LEFT HEART CATH AND CORONARY ANGIOGRAPHY N/A 12/05/2018   Procedure: LEFT HEART CATH AND CORONARY ANGIOGRAPHY with possible pci and stent;  Surgeon: Yolonda Kida, MD;  Location: [place] [facility];  Service: Cardiovascular;  Laterality: N/A;   PORTA CATH INSERTION Right 09/25/2021   Procedure: PORTA CATH INSERTION;  Surgeon: Algernon Huxley, MD;  Location: [place] [facility];  Service: Cardiovascular;  Laterality: Right;     Social History   Tobacco Use   Smoking status: Former    Packs/day: 1.00    Years: 30.00    Total pack years: 30.00    Types: Cigarettes    Quit date: 03/05/1984    Years since quitting: 37.8   Smokeless tobacco: Current    Types: Chew   Tobacco comments:    Chews tobacco daily.  Vaping Use   Vaping Use: Never used  Substance Use Topics   Alcohol use: No    Alcohol/week: 0.0 standard drinks of alcohol   Drug use: No      Family History  Problem Relation Age of Onset   Pulmonary embolism Mother    Transient ischemic attack Mother    Diabetes Mother    Pancreatic cancer Father    Hypertension Father    Diabetes Father    Cirrhosis Brother      Allergies  Allergen Reactions   Codeine Nausea Only, Nausea And Vomiting and Other (See Comments)    Other reaction(s): Vomiting    REVIEW OF  SYSTEMS (Negative unless checked)   Constitutional: [] Weight loss  [] Fever  [] Chills Cardiac: [] Chest pain   [] Chest pressure   [] Palpitations   [] Shortness of breath when laying flat   [] Shortness of breath at rest   [x] Shortness of breath with exertion. Vascular:  [] Pain in legs with walking   [] Pain in legs at rest   [] Pain in legs when laying flat   [] Claudication   [] Pain in feet when walking  [] Pain in feet at rest  [] Pain in feet when laying flat   [] History of DVT   [] Phlebitis   [] Swelling in legs   [] Varicose veins   []   Non-healing ulcers Pulmonary:   [] Uses home oxygen   [x] Productive cough   [] Hemoptysis   [] Wheeze  [x] COPD   [] Asthma Neurologic:  [] Dizziness  [] Blackouts   [] Seizures   [] History of stroke   [] History of TIA  [] Aphasia   [] Temporary blindness   [] Dysphagia   [] Weakness or numbness in arms   [] Weakness or numbness in legs Musculoskeletal:  [x] Arthritis   [] Joint swelling   [x] Joint pain   [] Low back pain Hematologic:  [] Easy bruising  [] Easy bleeding   [] Hypercoagulable state   [] Anemic  [] Hepatitis Gastrointestinal:  [] Blood in stool   [] Vomiting blood  [] Gastroesophageal reflux/heartburn   [] Abdominal pain Genitourinary:  [] Chronic kidney disease   [] Difficult urination  [] Frequent urination  [] Burning with urination   [] Hematuria Skin:  [] Rashes   [] Ulcers   [] Wounds Psychological:  [] History of anxiety   []  History of major depression.   Physical Examination  BP 95/61 (BP Location: Left Arm)   Pulse 100   Resp 15   Wt 157 lb (71.2 kg)   BMI 26.13 kg/m  Gen:  WD/WN, NAD Head: [place]/AT, No temporalis wasting. Ear/Nose/Throat: Hearing grossly intact, nares w/o erythema or drainage Eyes: Conjunctiva clear. Sclera non-icteric Neck: Supple.  Trachea midline Pulmonary:  Good air movement, no use of accessory muscles.  Cardiac: RRR, no JVD Vascular: port in place in right chest without erythema Vessel Right Left  Radial Palpable Palpable                Musculoskeletal: M/S 5/5 throughout.  No deformity or atrophy. No edema. Neurologic: Sensation grossly intact in extremities.  Symmetrical.  Speech is fluent.  Psychiatric: Judgment intact, Mood & affect appropriate for pt's clinical situation. Dermatologic: No rashes or ulcers noted.  No cellulitis or open wounds.      Labs Recent Results (from the past 2160 hour(s))  Rad Onc Aria Session Summary     Status: None   Collection Time: 10/19/21 11:06 AM  Result Value Ref Range   Course ID C1_Chest    Course Intent Curative    Course Start Date 09/26/2021  3:05 PM    Session Number 10    Course First Treatment Date 10/04/2021 11:08 AM    Course Last Treatment Date 10/19/2021 11:04 AM    Course Elapsed Days 15    Reference Point ID Lung_R DP    Reference Point Dosage Given to Date 96.2836629 Gy   Reference Point Session Dosage Given 4.76546503 Gy   Plan ID Lung_R_IMRT    Plan Fractions Treated to Date 10    Plan Total Fractions Prescribed 35    Plan Prescribed Dose Per Fraction 2 Gy   Plan Total Prescribed Dose 70.000000 Gy   Plan Primary Reference Point Lung_R DP   Rad Onc Aria Session Summary     Status: None   Collection Time: 10/20/21 11:06 AM  Result Value Ref Range   Course ID C1_Chest    Course Intent Curative    Course Start Date 09/26/2021  3:05 PM    Session Number 11    Course First Treatment Date 10/04/2021 11:08 AM    Course Last Treatment Date 10/20/2021 11:05 AM    Course Elapsed Days 16    Reference Point ID Lung_R DP    Reference Point Dosage Given to Date 21.99999989 Gy   Reference Point Session Dosage Given 5.46568127 Gy   Plan ID Lung_R_IMRT    Plan Fractions Treated to Date 11    Plan Total Fractions Prescribed  35    Plan Prescribed Dose Per Fraction 2 Gy   Plan Total Prescribed Dose 70.000000 Gy   Plan Primary Reference Point Lung_R DP   Rad Onc Aria Session Summary     Status: None   Collection Time: 10/23/21  9:09 AM  Result Value Ref Range   Course  ID C1_Chest    Course Intent Curative    Course Start Date 09/26/2021  3:05 PM    Session Number 12    Course First Treatment Date 10/04/2021 11:08 AM    Course Last Treatment Date 10/23/2021  9:08 AM    Course Elapsed Days 19    Reference Point ID Lung_R DP    Reference Point Dosage Given to Date 23.99999988 Gy   Reference Point Session Dosage Given 1.99999999 Gy   Plan ID Lung_R_IMRT    Plan Fractions Treated to Date 12    Plan Total Fractions Prescribed 35    Plan Prescribed Dose Per Fraction 2 Gy   Plan Total Prescribed Dose 70.000000 Gy   Plan Primary Reference Point Lung_R DP   Comprehensive metabolic panel     Status: Abnormal   Collection Time: 10/23/21  9:17 AM  Result Value Ref Range   Sodium 136 135 - 145 mmol/L   Potassium 3.2 (L) 3.5 - 5.1 mmol/L   Chloride 107 98 - 111 mmol/L   CO2 24 22 - 32 mmol/L   Glucose, Bld 148 (H) 70 - 99 mg/dL    Comment: Glucose reference range applies only to samples taken after fasting for at least 8 hours.   BUN 17 8 - 23 mg/dL   Creatinine, Ser 1.16 0.61 - 1.24 mg/dL   Calcium 7.9 (L) 8.9 - 10.3 mg/dL   Total Protein 6.4 (L) 6.5 - 8.1 g/dL   Albumin 3.1 (L) 3.5 - 5.0 g/dL   AST 39 15 - 41 U/L   ALT 72 (H) 0 - 44 U/L   Alkaline Phosphatase 81 38 - 126 U/L   Total Bilirubin 0.4 0.3 - 1.2 mg/dL   GFR, Estimated >60 >60 mL/min    Comment: (NOTE) Calculated using the CKD-EPI Creatinine Equation (2021)    Anion gap 5 5 - 15    Comment: Performed at [facility], [address] [place], [place] [address]  CBC with Differential     Status: Abnormal   Collection Time: 10/23/21  9:17 AM  Result Value Ref Range   WBC 3.2 (L) 4.0 - 10.5 K/uL   RBC 3.92 (L) 4.22 - 5.81 MIL/uL   Hemoglobin 10.8 (L) 13.0 - 17.0 g/dL   HCT 32.6 (L) 39.0 - 52.0 %   MCV 83.2 80.0 - 100.0 fL   MCH 27.6 26.0 - 34.0 pg   MCHC 33.1 30.0 - 36.0 g/dL   RDW 16.6 (H) 11.5 - 15.5 %   Platelets 206 150 - 400 K/uL   nRBC 0.0 0.0 - 0.2 %   Neutrophils Relative %  50 %   Neutro Abs 1.6 (L) 1.7 - 7.7 K/uL   Lymphocytes Relative 28 %   Lymphs Abs 0.9 0.7 - 4.0 K/uL   Monocytes Relative 17 %   Monocytes Absolute 0.5 0.1 - 1.0 K/uL   Eosinophils Relative 3 %   Eosinophils Absolute 0.1 0.0 - 0.5 K/uL   Basophils Relative 1 %   Basophils Absolute 0.0 0.0 - 0.1 K/uL   Immature Granulocytes 1 %   Abs Immature Granulocytes 0.04 0.00 - 0.07 K/uL  Comment: Performed at [facility], [address] [place], [place] [address]  Joella Prince Session Summary     Status: None   Collection Time: 10/24/21 10:59 AM  Result Value Ref Range   Course ID C1_Chest    Course Intent Curative    Course Start Date 09/26/2021  3:05 PM    Session Number 13    Course First Treatment Date 10/04/2021 11:08 AM    Course Last Treatment Date 10/24/2021 10:57 AM    Course Elapsed Days 20    Reference Point ID Lung_R DP    Reference Point Dosage Given to Date 86.76720947 Gy   Reference Point Session Dosage Given 1.99999999 Gy   Plan ID Lung_R_IMRT    Plan Fractions Treated to Date 13    Plan Total Fractions Prescribed 35    Plan Prescribed Dose Per Fraction 2 Gy   Plan Total Prescribed Dose 70.000000 Gy   Plan Primary Reference Point Lung_R DP   Rad Onc Aria Session Summary     Status: None   Collection Time: 10/25/21 10:59 AM  Result Value Ref Range   Course ID C1_Chest    Course Intent Curative    Course Start Date 09/26/2021  3:05 PM    Session Number 14    Course First Treatment Date 10/04/2021 11:08 AM    Course Last Treatment Date 10/25/2021 10:58 AM    Course Elapsed Days 21    Reference Point ID Lung_R DP    Reference Point Dosage Given to Date 27.99999986 Gy   Reference Point Session Dosage Given 1.99999999 Gy   Plan ID Lung_R_IMRT    Plan Fractions Treated to Date 14    Plan Total Fractions Prescribed 35    Plan Prescribed Dose Per Fraction 2 Gy   Plan Total Prescribed Dose 70.000000 Gy   Plan Primary Reference Point Lung_R DP   Rad Onc Aria Session  Summary     Status: None   Collection Time: 10/26/21 11:15 AM  Result Value Ref Range   Course ID C1_Chest    Course Intent Curative    Course Start Date 09/26/2021  3:05 PM    Session Number 15    Course First Treatment Date 10/04/2021 11:08 AM    Course Last Treatment Date 10/26/2021 11:13 AM    Course Elapsed Days 22    Reference Point ID Lung_R DP    Reference Point Dosage Given to Date 09.62836629 Gy   Reference Point Session Dosage Given 4.76546503 Gy   Plan ID Lung_R_IMRT    Plan Fractions Treated to Date 15    Plan Total Fractions Prescribed 35    Plan Prescribed Dose Per Fraction 2 Gy   Plan Total Prescribed Dose 70.000000 Gy   Plan Primary Reference Point Lung_R DP   Rad Onc Aria Session Summary     Status: None   Collection Time: 10/27/21 11:13 AM  Result Value Ref Range   Course ID C1_Chest    Course Intent Curative    Course Start Date 09/26/2021  3:05 PM    Session Number 16    Course First Treatment Date 10/04/2021 11:08 AM    Course Last Treatment Date 10/27/2021 11:11 AM    Course Elapsed Days 23    Reference Point ID Lung_R DP    Reference Point Dosage Given to Date 31.99999984 Gy   Reference Point Session Dosage Given 5.46568127 Gy   Plan ID Lung_R_IMRT    Plan Fractions Treated to Date 64  Plan Total Fractions Prescribed 35    Plan Prescribed Dose Per Fraction 2 Gy   Plan Total Prescribed Dose 70.000000 Gy   Plan Primary Reference Point Lung_R DP   Rad Onc Aria Session Summary     Status: None   Collection Time: 10/30/21  9:08 AM  Result Value Ref Range   Course ID C1_Chest    Course Intent Curative    Course Start Date 09/26/2021  3:05 PM    Session Number 17    Course First Treatment Date 10/04/2021 11:08 AM    Course Last Treatment Date 10/30/2021  9:07 AM    Course Elapsed Days 26    Reference Point ID Lung_R DP    Reference Point Dosage Given to Date 09.98338250 Gy   Reference Point Session Dosage Given 1.99999999 Gy   Plan ID Lung_R_IMRT    Plan  Fractions Treated to Date 17    Plan Total Fractions Prescribed 35    Plan Prescribed Dose Per Fraction 2 Gy   Plan Total Prescribed Dose 70.000000 Gy   Plan Primary Reference Point Lung_R DP   Comprehensive metabolic panel     Status: Abnormal   Collection Time: 10/30/21  9:22 AM  Result Value Ref Range   Sodium 133 (L) 135 - 145 mmol/L   Potassium 4.2 3.5 - 5.1 mmol/L   Chloride 102 98 - 111 mmol/L   CO2 22 22 - 32 mmol/L   Glucose, Bld 186 (H) 70 - 99 mg/dL    Comment: Glucose reference range applies only to samples taken after fasting for at least 8 hours.   BUN 19 8 - 23 mg/dL   Creatinine, Ser 1.04 0.61 - 1.24 mg/dL   Calcium 8.7 (L) 8.9 - 10.3 mg/dL   Total Protein 6.8 6.5 - 8.1 g/dL   Albumin 3.6 3.5 - 5.0 g/dL   AST 33 15 - 41 U/L   ALT 38 0 - 44 U/L   Alkaline Phosphatase 80 38 - 126 U/L   Total Bilirubin 1.0 0.3 - 1.2 mg/dL   GFR, Estimated >60 >60 mL/min    Comment: (NOTE) Calculated using the CKD-EPI Creatinine Equation (2021)    Anion gap 9 5 - 15    Comment: Performed at [facility], [address] [place], [place] [address]  CBC with Differential     Status: Abnormal   Collection Time: 10/30/21  9:22 AM  Result Value Ref Range   WBC 2.4 (L) 4.0 - 10.5 K/uL   RBC 4.05 (L) 4.22 - 5.81 MIL/uL   Hemoglobin 11.0 (L) 13.0 - 17.0 g/dL   HCT 33.1 (L) 39.0 - 52.0 %   MCV 81.7 80.0 - 100.0 fL   MCH 27.2 26.0 - 34.0 pg   MCHC 33.2 30.0 - 36.0 g/dL   RDW 16.3 (H) 11.5 - 15.5 %   Platelets 211 150 - 400 K/uL   nRBC 0.0 0.0 - 0.2 %   Neutrophils Relative % 63 %   Neutro Abs 1.5 (L) 1.7 - 7.7 K/uL   Lymphocytes Relative 25 %   Lymphs Abs 0.6 (L) 0.7 - 4.0 K/uL   Monocytes Relative 7 %   Monocytes Absolute 0.2 0.1 - 1.0 K/uL   Eosinophils Relative 3 %   Eosinophils Absolute 0.1 0.0 - 0.5 K/uL   Basophils Relative 1 %   Basophils Absolute 0.0 0.0 - 0.1 K/uL   Immature Granulocytes 1 %   Abs Immature Granulocytes 0.02 0.00 - 0.07 K/uL  Comment: Performed  at [facility], [address] [place], [place] [address]  Basic metabolic panel     Status: Abnormal   Collection Time: 10/30/21 11:59 AM  Result Value Ref Range   Sodium 136 135 - 145 mmol/L   Potassium 4.8 3.5 - 5.1 mmol/L   Chloride 106 98 - 111 mmol/L   CO2 20 (L) 22 - 32 mmol/L   Glucose, Bld 103 (H) 70 - 99 mg/dL    Comment: Glucose reference range applies only to samples taken after fasting for at least 8 hours.   BUN 19 8 - 23 mg/dL   Creatinine, Ser 1.20 0.61 - 1.24 mg/dL   Calcium 8.9 8.9 - 10.3 mg/dL   GFR, Estimated >60 >60 mL/min    Comment: (NOTE) Calculated using the CKD-EPI Creatinine Equation (2021)    Anion gap 10 5 - 15    Comment: Performed at [facility], [address] [place], [place] [address]  CBC     Status: Abnormal   Collection Time: 10/30/21 11:59 AM  Result Value Ref Range   WBC 3.1 (L) 4.0 - 10.5 K/uL   RBC 3.74 (L) 4.22 - 5.81 MIL/uL   Hemoglobin 10.2 (L) 13.0 - 17.0 g/dL   HCT 31.0 (L) 39.0 - 52.0 %   MCV 82.9 80.0 - 100.0 fL   MCH 27.3 26.0 - 34.0 pg   MCHC 32.9 30.0 - 36.0 g/dL   RDW 16.1 (H) 11.5 - 15.5 %   Platelets 195 150 - 400 K/uL   nRBC 0.0 0.0 - 0.2 %    Comment: Performed at [facility], [place], [place] [address]  Troponin I (High Sensitivity)     Status: None   Collection Time: 10/30/21 11:59 AM  Result Value Ref Range   Troponin I (High Sensitivity) 4 <18 ng/L    Comment: (NOTE) Elevated high sensitivity troponin I (hsTnI) values and significant  changes across serial measurements may suggest ACS but many other  chronic and acute conditions are known to elevate hsTnI results.  Refer to the "Links" section for chest pain algorithms and additional  guidance. Performed at [facility], [address], [place] [address]   Troponin I (High Sensitivity)     Status: None   Collection Time: 10/30/21  2:20 PM  Result Value Ref Range   Troponin I (High Sensitivity)  4 <18 ng/L    Comment: (NOTE) Elevated high sensitivity troponin I (hsTnI) values and significant  changes across serial measurements may suggest ACS but many other  chronic and acute conditions are known to elevate hsTnI results.  Refer to the "Links" section for chest pain algorithms and additional  guidance. Performed at [facility], [address] [place], [place] [address]   Joella Prince Session Summary     Status: None   Collection Time: 10/31/21 11:15 AM  Result Value Ref Range   Course ID C1_Chest    Course Intent Curative    Course Start Date 09/26/2021  3:05 PM    Session Number 18    Course First Treatment Date 10/04/2021 11:08 AM    Course Last Treatment Date 10/31/2021 11:14 AM    Course Elapsed Days 27    Reference Point ID Lung_R DP    Reference Point Dosage Given to Date 35.99999982 Gy   Reference Point Session Dosage Given 1.99999999 Gy   Plan ID Lung_R_IMRT    Plan Fractions Treated to Date 29  Plan Total Fractions Prescribed 35    Plan Prescribed Dose Per Fraction 2 Gy   Plan Total Prescribed Dose 70.000000 Gy   Plan Primary Reference Point Lung_R DP   Rad Onc Aria Session Summary     Status: None   Collection Time: 11/01/21  3:05 PM  Result Value Ref Range   Course ID C1_Chest    Course Intent Curative    Course Start Date 09/26/2021  3:05 PM    Session Number 7    Course First Treatment Date 10/04/2021 11:08 AM    Course Last Treatment Date 11/01/2021  3:03 PM    Course Elapsed Days 28    Reference Point ID Lung_R DP    Reference Point Dosage Given to Date 62.69485462 Gy   Reference Point Session Dosage Given 1.99999999 Gy   Plan ID Lung_R_IMRT    Plan Fractions Treated to Date 19    Plan Total Fractions Prescribed 35    Plan Prescribed Dose Per Fraction 2 Gy   Plan Total Prescribed Dose 70.000000 Gy   Plan Primary Reference Point Lung_R DP   Rad Onc Aria Session Summary     Status: None   Collection Time: 11/02/21 11:04 AM  Result Value  Ref Range   Course ID C1_Chest    Course Intent Curative    Course Start Date 09/26/2021  3:05 PM    Session Number 20    Course First Treatment Date 10/04/2021 11:08 AM    Course Last Treatment Date 11/02/2021 11:03 AM    Course Elapsed Days 29    Reference Point ID Lung_R DP    Reference Point Dosage Given to Date 39.9999998 Gy   Reference Point Session Dosage Given 1.99999999 Gy   Plan ID Lung_R_IMRT    Plan Fractions Treated to Date 20    Plan Total Fractions Prescribed 35    Plan Prescribed Dose Per Fraction 2 Gy   Plan Total Prescribed Dose 70.000000 Gy   Plan Primary Reference Point Lung_R DP   Rad Onc Aria Session Summary     Status: None   Collection Time: 11/03/21 11:02 AM  Result Value Ref Range   Course ID C1_Chest    Course Intent Curative    Course Start Date 09/26/2021  3:05 PM    Session Number 21    Course First Treatment Date 10/04/2021 11:08 AM    Course Last Treatment Date 11/03/2021 11:01 AM    Course Elapsed Days 30    Reference Point ID Lung_R DP    Reference Point Dosage Given to Date 70.35009381 Gy   Reference Point Session Dosage Given 8.29937169 Gy   Plan ID Lung_R_IMRT    Plan Fractions Treated to Date 21    Plan Total Fractions Prescribed 35    Plan Prescribed Dose Per Fraction 2 Gy   Plan Total Prescribed Dose 70.000000 Gy   Plan Primary Reference Point Lung_R DP   Rad Onc Aria Session Summary     Status: None   Collection Time: 11/07/21 11:03 AM  Result Value Ref Range   Course ID C1_Chest    Course Intent Curative    Course Start Date 09/26/2021  3:05 PM    Session Number 22    Course First Treatment Date 10/04/2021 11:08 AM    Course Last Treatment Date 11/07/2021 11:02 AM    Course Elapsed Days 34    Reference Point ID Lung_R DP    Reference Point Dosage Given to Date 67.89381017 Gy   Reference  Point Session Dosage Given 8.34196222 Gy   Plan ID Lung_R_IMRT    Plan Fractions Treated to Date 22    Plan Total Fractions Prescribed 35    Plan  Prescribed Dose Per Fraction 2 Gy   Plan Total Prescribed Dose 70.000000 Gy   Plan Primary Reference Point Lung_R DP   Comprehensive metabolic panel     Status: Abnormal   Collection Time: 11/08/21 12:48 PM  Result Value Ref Range   Sodium 135 135 - 145 mmol/L   Potassium 3.9 3.5 - 5.1 mmol/L   Chloride 104 98 - 111 mmol/L   CO2 24 22 - 32 mmol/L   Glucose, Bld 92 70 - 99 mg/dL    Comment: Glucose reference range applies only to samples taken after fasting for at least 8 hours.   BUN 19 8 - 23 mg/dL   Creatinine, Ser 1.08 0.61 - 1.24 mg/dL   Calcium 8.7 (L) 8.9 - 10.3 mg/dL   Total Protein 6.4 (L) 6.5 - 8.1 g/dL   Albumin 3.5 3.5 - 5.0 g/dL   AST 22 15 - 41 U/L   ALT 24 0 - 44 U/L   Alkaline Phosphatase 77 38 - 126 U/L   Total Bilirubin 0.5 0.3 - 1.2 mg/dL   GFR, Estimated >60 >60 mL/min    Comment: (NOTE) Calculated using the CKD-EPI Creatinine Equation (2021)    Anion gap 7 5 - 15    Comment: Performed at [facility], [address] [place], [place] [address]  CBC with Differential     Status: Abnormal   Collection Time: 11/08/21 12:48 PM  Result Value Ref Range   WBC 3.5 (L) 4.0 - 10.5 K/uL   RBC 3.68 (L) 4.22 - 5.81 MIL/uL   Hemoglobin 10.4 (L) 13.0 - 17.0 g/dL   HCT 30.8 (L) 39.0 - 52.0 %   MCV 83.7 80.0 - 100.0 fL   MCH 28.3 26.0 - 34.0 pg   MCHC 33.8 30.0 - 36.0 g/dL   RDW 17.6 (H) 11.5 - 15.5 %   Platelets 197 150 - 400 K/uL   nRBC 1.7 (H) 0.0 - 0.2 %   Neutrophils Relative % 53 %   Neutro Abs 1.9 1.7 - 7.7 K/uL   Lymphocytes Relative 22 %   Lymphs Abs 0.8 0.7 - 4.0 K/uL   Monocytes Relative 18 %   Monocytes Absolute 0.6 0.1 - 1.0 K/uL   Eosinophils Relative 1 %   Eosinophils Absolute 0.0 0.0 - 0.5 K/uL   Basophils Relative 1 %   Basophils Absolute 0.0 0.0 - 0.1 K/uL   Immature Granulocytes 5 %   Abs Immature Granulocytes 0.17 (H) 0.00 - 0.07 K/uL    Comment: Performed at [facility], [address] [place], [place] [address]  Rad Sandria Senter Session Summary     Status: None   Collection Time: 11/08/21  1:52 PM  Result Value Ref Range   Course ID C1_Chest    Course Intent Curative    Course Start Date 09/26/2021  3:05 PM    Session Number 23    Course First Treatment Date 10/04/2021 11:08 AM    Course Last Treatment Date 11/08/2021  1:50 PM    Course Elapsed Days 35    Reference Point ID Lung_R DP    Reference Point Dosage Given to Date 17.40814481 Gy   Reference Point Session Dosage Given 8.56314970 Gy   Plan ID Lung_R_IMRT    Plan Fractions Treated to Date 45  Plan Total Fractions Prescribed 35    Plan Prescribed Dose Per Fraction 2 Gy   Plan Total Prescribed Dose 70.000000 Gy   Plan Primary Reference Point Lung_R DP   Rad Onc Aria Session Summary     Status: None   Collection Time: 11/09/21 11:11 AM  Result Value Ref Range   Course ID C1_Chest    Course Intent Curative    Course Start Date 09/26/2021  3:05 PM    Session Number 24    Course First Treatment Date 10/04/2021 11:08 AM    Course Last Treatment Date 11/09/2021 11:10 AM    Course Elapsed Days 36    Reference Point ID Lung_R DP    Reference Point Dosage Given to Date 50.09381829 Gy   Reference Point Session Dosage Given 1.99999999 Gy   Plan ID Lung_R_IMRT    Plan Fractions Treated to Date 24    Plan Total Fractions Prescribed 35    Plan Prescribed Dose Per Fraction 2 Gy   Plan Total Prescribed Dose 70.000000 Gy   Plan Primary Reference Point Lung_R DP   Rad Onc Aria Session Summary     Status: None   Collection Time: 11/10/21 11:08 AM  Result Value Ref Range   Course ID C1_Chest    Course Intent Curative    Course Start Date 09/26/2021  3:05 PM    Session Number 25    Course First Treatment Date 10/04/2021 11:08 AM    Course Last Treatment Date 11/10/2021 11:06 AM    Course Elapsed Days 37    Reference Point ID Lung_R DP    Reference Point Dosage Given to Date 93.71696789 Gy   Reference Point Session Dosage Given 3.81017510 Gy   Plan ID Lung_R_IMRT     Plan Fractions Treated to Date 25    Plan Total Fractions Prescribed 35    Plan Prescribed Dose Per Fraction 2 Gy   Plan Total Prescribed Dose 70.000000 Gy   Plan Primary Reference Point Lung_R DP   Rad Onc Aria Session Summary     Status: None   Collection Time: 11/13/21 10:56 AM  Result Value Ref Range   Course ID C1_Chest    Course Intent Curative    Course Start Date 09/26/2021  3:05 PM    Session Number 26    Course First Treatment Date 10/04/2021 11:08 AM    Course Last Treatment Date 11/13/2021 10:54 AM    Course Elapsed Days 40    Reference Point ID Lung_R DP    Reference Point Dosage Given to Date 25.85277824 Gy   Reference Point Session Dosage Given 2.35361443 Gy   Plan ID Lung_R_IMRT    Plan Fractions Treated to Date 26    Plan Total Fractions Prescribed 35    Plan Prescribed Dose Per Fraction 2 Gy   Plan Total Prescribed Dose 70.000000 Gy   Plan Primary Reference Point Lung_R DP   Rad Onc Aria Session Summary     Status: None   Collection Time: 11/14/21 11:03 AM  Result Value Ref Range   Course ID C1_Chest    Course Intent Curative    Course Start Date 09/26/2021  3:05 PM    Session Number 27    Course First Treatment Date 10/04/2021 11:08 AM    Course Last Treatment Date 11/14/2021 11:02 AM    Course Elapsed Days 41    Reference Point ID Lung_R DP    Reference Point Dosage Given to Date 15.40086761 Gy   Reference Point Session  Dosage Given 1.99999999 Gy   Plan ID Lung_R_IMRT    Plan Fractions Treated to Date 27    Plan Total Fractions Prescribed 35    Plan Prescribed Dose Per Fraction 2 Gy   Plan Total Prescribed Dose 70.000000 Gy   Plan Primary Reference Point Lung_R DP   Rad Onc Aria Session Summary     Status: None   Collection Time: 11/15/21  8:14 AM  Result Value Ref Range   Course ID C1_Chest    Course Intent Curative    Course Start Date 09/26/2021  3:05 PM    Session Number 28    Course First Treatment Date 10/04/2021 11:08 AM    Course Last Treatment  Date 11/15/2021  8:12 AM    Course Elapsed Days 42    Reference Point ID Lung_R DP    Reference Point Dosage Given to Date 55.99999972 Gy   Reference Point Session Dosage Given 1.99999999 Gy   Plan ID Lung_R_IMRT    Plan Fractions Treated to Date 28    Plan Total Fractions Prescribed 35    Plan Prescribed Dose Per Fraction 2 Gy   Plan Total Prescribed Dose 70.000000 Gy   Plan Primary Reference Point Lung_R DP   Comprehensive metabolic panel     Status: Abnormal   Collection Time: 11/15/21  8:39 AM  Result Value Ref Range   Sodium 132 (L) 135 - 145 mmol/L   Potassium 4.0 3.5 - 5.1 mmol/L   Chloride 105 98 - 111 mmol/L   CO2 22 22 - 32 mmol/L   Glucose, Bld 134 (H) 70 - 99 mg/dL    Comment: Glucose reference range applies only to samples taken after fasting for at least 8 hours.   BUN 18 8 - 23 mg/dL   Creatinine, Ser 1.18 0.61 - 1.24 mg/dL   Calcium 8.5 (L) 8.9 - 10.3 mg/dL   Total Protein 6.2 (L) 6.5 - 8.1 g/dL   Albumin 3.4 (L) 3.5 - 5.0 g/dL   AST 22 15 - 41 U/L   ALT 22 0 - 44 U/L   Alkaline Phosphatase 66 38 - 126 U/L   Total Bilirubin 0.7 0.3 - 1.2 mg/dL   GFR, Estimated >60 >60 mL/min    Comment: (NOTE) Calculated using the CKD-EPI Creatinine Equation (2021)    Anion gap 5 5 - 15    Comment: Performed at [facility], [address] [place], [place] [address]  CBC with Differential     Status: Abnormal   Collection Time: 11/15/21  8:39 AM  Result Value Ref Range   WBC 2.1 (L) 4.0 - 10.5 K/uL   RBC 3.31 (L) 4.22 - 5.81 MIL/uL   Hemoglobin 9.4 (L) 13.0 - 17.0 g/dL   HCT 27.9 (L) 39.0 - 52.0 %   MCV 84.3 80.0 - 100.0 fL   MCH 28.4 26.0 - 34.0 pg   MCHC 33.7 30.0 - 36.0 g/dL   RDW 17.6 (H) 11.5 - 15.5 %   Platelets 182 150 - 400 K/uL   nRBC 0.0 0.0 - 0.2 %   Neutrophils Relative % 70 %   Neutro Abs 1.5 (L) 1.7 - 7.7 K/uL   Lymphocytes Relative 17 %   Lymphs Abs 0.4 (L) 0.7 - 4.0 K/uL   Monocytes Relative 10 %   Monocytes Absolute 0.2 0.1 - 1.0 K/uL    Eosinophils Relative 1 %   Eosinophils Absolute 0.0 0.0 - 0.5 K/uL   Basophils Relative 1 %   Basophils  Absolute 0.0 0.0 - 0.1 K/uL   Immature Granulocytes 1 %   Abs Immature Granulocytes 0.01 0.00 - 0.07 K/uL    Comment: Performed at [facility], [address] [place], [place] [address]  Joella Prince Session Summary     Status: None   Collection Time: 11/16/21 11:02 AM  Result Value Ref Range   Course ID C1_Chest    Course Intent Curative    Course Start Date 09/26/2021  3:05 PM    Session Number 73    Course First Treatment Date 10/04/2021 11:08 AM    Course Last Treatment Date 11/16/2021 11:00 AM    Course Elapsed Days 43    Reference Point ID Lung_R DP    Reference Point Dosage Given to Date 76.28315176 Gy   Reference Point Session Dosage Given 1.60737106 Gy   Plan ID Lung_R_IMRT    Plan Fractions Treated to Date 29    Plan Total Fractions Prescribed 35    Plan Prescribed Dose Per Fraction 2 Gy   Plan Total Prescribed Dose 70.000000 Gy   Plan Primary Reference Point Lung_R DP   Rad Onc Aria Session Summary     Status: None   Collection Time: 11/17/21 11:02 AM  Result Value Ref Range   Course ID C1_Chest    Course Intent Curative    Course Start Date 09/26/2021  3:05 PM    Session Number 30    Course First Treatment Date 10/04/2021 11:08 AM    Course Last Treatment Date 11/17/2021 11:01 AM    Course Elapsed Days 44    Reference Point ID Lung_R DP    Reference Point Dosage Given to Date 26.9485462 Gy   Reference Point Session Dosage Given 1.99999999 Gy   Plan ID Lung_R_IMRT    Plan Fractions Treated to Date 30    Plan Total Fractions Prescribed 35    Plan Prescribed Dose Per Fraction 2 Gy   Plan Total Prescribed Dose 70.000000 Gy   Plan Primary Reference Point Lung_R DP   Rad Onc Aria Session Summary     Status: None   Collection Time: 11/20/21 11:05 AM  Result Value Ref Range   Course ID C1_Chest    Course Intent Curative    Course Start Date 09/26/2021  3:05 PM     Session Number 31    Course First Treatment Date 10/04/2021 11:08 AM    Course Last Treatment Date 11/20/2021 11:04 AM    Course Elapsed Days 47    Reference Point ID Lung_R DP    Reference Point Dosage Given to Date 70.35009381 Gy   Reference Point Session Dosage Given 1.99999999 Gy   Plan ID Lung_R_IMRT    Plan Fractions Treated to Date 31    Plan Total Fractions Prescribed 35    Plan Prescribed Dose Per Fraction 2 Gy   Plan Total Prescribed Dose 70.000000 Gy   Plan Primary Reference Point Lung_R DP   Comprehensive metabolic panel     Status: Abnormal   Collection Time: 11/21/21 10:06 AM  Result Value Ref Range   Sodium 134 (L) 135 - 145 mmol/L   Potassium 3.6 3.5 - 5.1 mmol/L   Chloride 106 98 - 111 mmol/L   CO2 22 22 - 32 mmol/L   Glucose, Bld 127 (H) 70 - 99 mg/dL    Comment: Glucose reference range applies only to samples taken after fasting for at least 8 hours.   BUN 21 8 - 23 mg/dL   Creatinine, Ser  1.19 0.61 - 1.24 mg/dL   Calcium 8.4 (L) 8.9 - 10.3 mg/dL   Total Protein 6.5 6.5 - 8.1 g/dL   Albumin 3.4 (L) 3.5 - 5.0 g/dL   AST 24 15 - 41 U/L   ALT 23 0 - 44 U/L   Alkaline Phosphatase 66 38 - 126 U/L   Total Bilirubin 0.8 0.3 - 1.2 mg/dL   GFR, Estimated >60 >60 mL/min    Comment: (NOTE) Calculated using the CKD-EPI Creatinine Equation (2021)    Anion gap 6 5 - 15    Comment: Performed at [facility], [address] [place], [place] [address]  CBC with Differential     Status: Abnormal   Collection Time: 11/21/21 10:06 AM  Result Value Ref Range   WBC 1.0 (LL) 4.0 - 10.5 K/uL    Comment: REPEATED TO VERIFY THIS CRITICAL RESULT HAS VERIFIED AND BEEN CALLED TO KATHERINE G. BY KIM ROOS ON 09 19 2023 AT 1761, AND HAS BEEN READ BACK.     RBC 3.11 (L) 4.22 - 5.81 MIL/uL   Hemoglobin 8.8 (L) 13.0 - 17.0 g/dL   HCT 25.9 (L) 39.0 - 52.0 %   MCV 83.3 80.0 - 100.0 fL   MCH 28.3 26.0 - 34.0 pg   MCHC 34.0 30.0 - 36.0 g/dL   RDW 18.0 (H) 11.5 - 15.5 %    Platelets 202 150 - 400 K/uL   nRBC 0.0 0.0 - 0.2 %   Neutrophils Relative % 54 %   Neutro Abs 0.6 (L) 1.7 - 7.7 K/uL   Lymphocytes Relative 26 %   Lymphs Abs 0.3 (L) 0.7 - 4.0 K/uL   Monocytes Relative 18 %   Monocytes Absolute 0.2 0.1 - 1.0 K/uL   Eosinophils Relative 1 %   Eosinophils Absolute 0.0 0.0 - 0.5 K/uL   Basophils Relative 1 %   Basophils Absolute 0.0 0.0 - 0.1 K/uL   Immature Granulocytes 0 %   Abs Immature Granulocytes 0.00 0.00 - 0.07 K/uL    Comment: Performed at [facility], [address] [place], [place] [address]  Rad Sandria Senter Session Summary     Status: None   Collection Time: 11/21/21 11:28 AM  Result Value Ref Range   Course ID C1_Chest    Course Intent Curative    Course Start Date 09/26/2021  3:05 PM    Session Number 32    Course First Treatment Date 10/04/2021 11:08 AM    Course Last Treatment Date 11/21/2021 11:27 AM    Course Elapsed Days 48    Reference Point ID Lung_R DP    Reference Point Dosage Given to Date 10.62694854 Gy   Reference Point Session Dosage Given 6.27035009 Gy   Plan ID Lung_R_IMRT    Plan Fractions Treated to Date 32    Plan Total Fractions Prescribed 35    Plan Prescribed Dose Per Fraction 2 Gy   Plan Total Prescribed Dose 70.000000 Gy   Plan Primary Reference Point Lung_R DP   Rad Onc Aria Session Summary     Status: None   Collection Time: 11/22/21  8:16 AM  Result Value Ref Range   Course ID C1_Chest    Course Intent Curative    Course Start Date 09/26/2021  3:05 PM    Session Number 33    Course First Treatment Date 10/04/2021 11:08 AM    Course Last Treatment Date 11/22/2021  8:15 AM    Course Elapsed Days 49  Reference Point ID Lung_R DP    Reference Point Dosage Given to Date 02.58527782 Gy   Reference Point Session Dosage Given 4.23536144 Gy   Plan ID Lung_R_IMRT    Plan Fractions Treated to Date 3    Plan Total Fractions Prescribed 35    Plan Prescribed Dose Per Fraction 2 Gy   Plan Total Prescribed  Dose 70.000000 Gy   Plan Primary Reference Point Lung_R DP   Rad Onc Aria Session Summary     Status: None   Collection Time: 11/23/21 11:00 AM  Result Value Ref Range   Course ID C1_Chest    Course Intent Curative    Course Start Date 09/26/2021  3:05 PM    Session Number 34    Course First Treatment Date 10/04/2021 11:08 AM    Course Last Treatment Date 11/23/2021 10:59 AM    Course Elapsed Days 50    Reference Point ID Lung_R DP    Reference Point Dosage Given to Date 31.54008676 Gy   Reference Point Session Dosage Given 1.95093267 Gy   Plan ID Lung_R_IMRT    Plan Fractions Treated to Date 64    Plan Total Fractions Prescribed 35    Plan Prescribed Dose Per Fraction 2 Gy   Plan Total Prescribed Dose 70.000000 Gy   Plan Primary Reference Point Lung_R DP   Rad Onc Aria Session Summary     Status: None   Collection Time: 11/24/21 11:01 AM  Result Value Ref Range   Course ID C1_Chest    Course Intent Curative    Course Start Date 09/26/2021  3:05 PM    Session Number 35    Course First Treatment Date 10/04/2021 11:08 AM    Course Last Treatment Date 11/24/2021 10:59 AM    Course Elapsed Days 51    Reference Point ID Lung_R DP    Reference Point Dosage Given to Date 12.45809983 Gy   Reference Point Session Dosage Given 3.82505397 Gy   Plan ID Lung_R_IMRT    Plan Fractions Treated to Date 35    Plan Total Fractions Prescribed 35    Plan Prescribed Dose Per Fraction 2 Gy   Plan Total Prescribed Dose 70.000000 Gy   Plan Primary Reference Point Lung_R DP   CBC with Differential/Platelet     Status: Abnormal   Collection Time: 12/08/21  8:53 AM  Result Value Ref Range   WBC 5.4 4.0 - 10.5 K/uL   RBC 3.23 (L) 4.22 - 5.81 MIL/uL   Hemoglobin 9.2 (L) 13.0 - 17.0 g/dL   HCT 28.8 (L) 39.0 - 52.0 %   MCV 89.2 80.0 - 100.0 fL   MCH 28.5 26.0 - 34.0 pg   MCHC 31.9 30.0 - 36.0 g/dL   RDW 21.4 (H) 11.5 - 15.5 %   Platelets 169 150 - 400 K/uL   nRBC 0.0 0.0 - 0.2 %   Neutrophils Relative %  63 %   Neutro Abs 3.4 1.7 - 7.7 K/uL   Lymphocytes Relative 10 %   Lymphs Abs 0.5 (L) 0.7 - 4.0 K/uL   Monocytes Relative 15 %   Monocytes Absolute 0.8 0.1 - 1.0 K/uL   Eosinophils Relative 11 %   Eosinophils Absolute 0.6 (H) 0.0 - 0.5 K/uL   Basophils Relative 1 %   Basophils Absolute 0.0 0.0 - 0.1 K/uL   Immature Granulocytes 0 %   Abs Immature Granulocytes 0.01 0.00 - 0.07 K/uL    Comment: Performed at [facility], [address],  [place], [place] [address]  Comprehensive metabolic panel     Status: Abnormal   Collection Time: 12/08/21  8:53 AM  Result Value Ref Range   Sodium 133 (L) 135 - 145 mmol/L   Potassium 3.6 3.5 - 5.1 mmol/L   Chloride 106 98 - 111 mmol/L   CO2 22 22 - 32 mmol/L   Glucose, Bld 120 (H) 70 - 99 mg/dL    Comment: Glucose reference range applies only to samples taken after fasting for at least 8 hours.   BUN 16 8 - 23 mg/dL   Creatinine, Ser 1.17 0.61 - 1.24 mg/dL   Calcium 8.3 (L) 8.9 - 10.3 mg/dL   Total Protein 6.5 6.5 - 8.1 g/dL   Albumin 3.2 (L) 3.5 - 5.0 g/dL   AST 32 15 - 41 U/L   ALT 29 0 - 44 U/L   Alkaline Phosphatase 76 38 - 126 U/L   Total Bilirubin 0.5 0.3 - 1.2 mg/dL   GFR, Estimated >60 >60 mL/min    Comment: (NOTE) Calculated using the CKD-EPI Creatinine Equation (2021)    Anion gap 5 5 - 15    Comment: Performed at [facility], [place], [place] [address]  POCT Glucose (CBG)     Status: None   Collection Time: 01/01/22  4:11 PM  Result Value Ref Range   POC Glucose 81 70 - 99 mg/dl  Comprehensive metabolic panel     Status: Abnormal   Collection Time: 01/08/22 10:09 AM  Result Value Ref Range   Sodium 137 135 - 145 mmol/L   Potassium 3.2 (L) 3.5 - 5.1 mmol/L   Chloride 107 98 - 111 mmol/L   CO2 22 22 - 32 mmol/L   Glucose, Bld 118 (H) 70 - 99 mg/dL    Comment: Glucose reference range applies only to samples taken after fasting for at least 8 hours.   BUN 10 8 - 23 mg/dL   Creatinine, Ser 0.93  0.61 - 1.24 mg/dL   Calcium 8.6 (L) 8.9 - 10.3 mg/dL   Total Protein 6.5 6.5 - 8.1 g/dL   Albumin 3.1 (L) 3.5 - 5.0 g/dL   AST 52 (H) 15 - 41 U/L   ALT 51 (H) 0 - 44 U/L   Alkaline Phosphatase 88 38 - 126 U/L   Total Bilirubin 0.4 0.3 - 1.2 mg/dL   GFR, Estimated >60 >60 mL/min    Comment: (NOTE) Calculated using the CKD-EPI Creatinine Equation (2021)    Anion gap 8 5 - 15    Comment: Performed at [facility], [address] [place], [place] [address]  CBC with Differential/Platelet     Status: Abnormal   Collection Time: 01/08/22 10:09 AM  Result Value Ref Range   WBC 6.3 4.0 - 10.5 K/uL   RBC 3.57 (L) 4.22 - 5.81 MIL/uL   Hemoglobin 10.6 (L) 13.0 - 17.0 g/dL   HCT 32.9 (L) 39.0 - 52.0 %   MCV 92.2 80.0 - 100.0 fL   MCH 29.7 26.0 - 34.0 pg   MCHC 32.2 30.0 - 36.0 g/dL   RDW 18.6 (H) 11.5 - 15.5 %   Platelets 260 150 - 400 K/uL   nRBC 0.0 0.0 - 0.2 %   Neutrophils Relative % 76 %   Neutro Abs 4.8 1.7 - 7.7 K/uL   Lymphocytes Relative 10 %   Lymphs Abs 0.6 (L) 0.7 - 4.0 K/uL   Monocytes Relative 10 %   Monocytes Absolute 0.6  0.1 - 1.0 K/uL   Eosinophils Relative 4 %   Eosinophils Absolute 0.2 0.0 - 0.5 K/uL   Basophils Relative 0 %   Basophils Absolute 0.0 0.0 - 0.1 K/uL   Immature Granulocytes 0 %   Abs Immature Granulocytes 0.02 0.00 - 0.07 K/uL    Comment: Performed at [facility], [address] [place], [place] [address]  POCT glycosylated hemoglobin (Hb A1C)     Status: None   Collection Time: 01/08/22  2:32 PM  Result Value Ref Range   Hemoglobin A1C 5.1 4.0 - 5.6 %   Est. average glucose Bld gHb Est-mCnc 100     Radiology DG Chest 2 View  Result Date: 12/19/2021 CLINICAL DATA:  Cough and wheezing. Chest pain and shortness of breath for 2-3 months. History of lung carcinoma. EXAM: CHEST - 2 VIEW COMPARISON:  10/30/2021.  CT, 12/05/2021. FINDINGS: Cardiac silhouette is normal in size. Left coronary artery stent. No mediastinal or hilar masses.  No evidence of adenopathy. Right anterior chest wall Port-A-Cath is stable, tip in the lower superior vena cava. Wedge-shaped opacity in the lateral, lower right upper lobe, consistent with the area of treated lung carcinoma, unchanged. Mild bilateral interstitial thickening. Additional linear scarring in the right mid lung. These findings are also stable. No evidence of pneumonia or pulmonary edema. No pleural effusion or pneumothorax. Skeletal structures are intact. IMPRESSION: 1. No acute cardiopulmonary disease. 2. Stable appearance from the previous chest radiograph. Electronically Signed   By: Lajean Manes M.D.   On: 12/19/2021 15:14    Assessment/Plan Hypertension associated with diabetes ([place]) blood pressure control important in reducing the progression of atherosclerotic disease. On appropriate oral medications.     T2DM (type 2 diabetes mellitus) (HCC) blood glucose control important in reducing the progression of atherosclerotic disease. Also, involved in wound healing. On appropriate medications.     Hyperlipidemia associated with type 2 diabetes mellitus (HCC) lipid control important in reducing the progression of atherosclerotic disease. Continue statin therapy  Malignant neoplasm of upper lobe of right lung ([place]) Port in place and getting treatments  Carotid stenosis Carotid duplex today shows the right carotid stent to be widely patent and the left carotid artery to have 1 to 39% ICA stenosis.  Doing well.  Continue current medical regimen.  Recheck in 6 months.    Leotis Pain, MD  01/16/2022 4:17 PM    This note was created with Dragon medical transcription system.  Any errors from dictation are purely unintentional

## 2022-01-16 NOTE — Assessment & Plan Note (Signed)
Port in place and getting treatments

## 2022-01-18 ENCOUNTER — Ambulatory Visit: Payer: Medicare Other | Admitting: Family Medicine

## 2022-01-22 ENCOUNTER — Ambulatory Visit: Payer: Self-pay | Admitting: *Deleted

## 2022-01-22 DIAGNOSIS — H52223 Regular astigmatism, bilateral: Secondary | ICD-10-CM | POA: Diagnosis not present

## 2022-01-22 DIAGNOSIS — Z9841 Cataract extraction status, right eye: Secondary | ICD-10-CM | POA: Diagnosis not present

## 2022-01-22 DIAGNOSIS — E119 Type 2 diabetes mellitus without complications: Secondary | ICD-10-CM | POA: Diagnosis not present

## 2022-01-22 DIAGNOSIS — Z9842 Cataract extraction status, left eye: Secondary | ICD-10-CM | POA: Diagnosis not present

## 2022-01-22 LAB — HM DIABETES EYE EXAM

## 2022-01-22 NOTE — Patient Instructions (Signed)
Visit Information  Thank you for taking time to visit with me today. Please don't hesitate to contact me if I can be of assistance to you before our next scheduled telephone appointment.  Following are the goals we discussed today:  Discuss pulmonary referral with provider tomorrow  Our next appointment is by telephone on 12/5  Please call the care guide team at [phone] if you need to cancel or reschedule your appointment.   Please call the Suicide and Crisis Lifeline: 988 call the [place] National Suicide Prevention Lifeline: 567 871 2435 or TTY: (501)099-4585 TTY [phone]) to talk to a trained counselor call 1-800-273-TALK (toll free, 24 hour hotline) call 911 if you are experiencing a Mental Health or [place] or need someone to talk to.  Patient verbalizes understanding of instructions and care plan provided today and agrees to view in [place]. Active MyChart status and patient understanding of how to access instructions and care plan via MyChart confirmed with patient.     The patient has been provided with contact information for the care management team and has been advised to call with any health related questions or concerns.   Valente David, RN, MSN, [place] Care Management Care Management Coordinator [phone]

## 2022-01-22 NOTE — Patient Outreach (Signed)
  Care Coordination   Follow Up Visit Note   01/22/2022 Name: BARACK NICODEMUS MRN: [id number] DOB: 13-Jan-1946  Lorne Skeens Ventrella is a 76 y.o. year old male who sees Bacigalupo, Dionne Bucy, MD for primary care. I spoke with  Joycelyn Man by phone today.  What matters to the patients health and wellness today?  Ongoing shortness of breath.   Seen last by cancer center on 11/6 and by PCP on 11/13 for this complaint.   Goals Addressed             This Visit's Progress    Decrease fall risk   On track    Care Coordination Interventions: Provided written and verbal education re: potential causes of falls and Fall prevention strategies Reviewed medications and discussed potential side effects of medications such as dizziness and frequent urination Advised patient of importance of notifying provider of falls Assessed patients knowledge of fall risk prevention secondary to previously provided education Advised patient to discuss BP readings with provider Confirmed BP is not more stable, decreased risk of orthostatic hypotension with taking medications correctly.        Management of COPD and lung cancer       Care Coordination Interventions: Provided patient with basic written and verbal COPD education on self care/management/and exacerbation prevention Provided written and verbal instructions on pursed lip breathing and utilized returned demonstration as teach back Provided instruction about proper use of medications used for management of COPD including inhalers Advised patient to self assesses COPD action plan zone and make appointment with provider if in the yellow zone for 48 hours without improvement Discussed use of nebulizers.  Has picked up medications from pharmacy but was having trouble with insurance approving machine.  He is using a machine that was provided by family, state he contacted provider to let them know and they are ok with this arrangement.  Inquired if he  wanted this care manager to follow up with insurance to get DME approved, he declined.   Discussed provider's thought of completing pulmonary consult if he does not feel better.  Will have appointment with oncology tomorrow, will discuss it with him at that time.          SDOH assessments and interventions completed:  No     Care Coordination Interventions Activated:  Yes  Care Coordination Interventions:  Yes, provided   Follow up plan: Follow up call scheduled for 12/5    Encounter Outcome:  Pt. Visit Completed   Valente David, RN, MSN, [place] Care Management Care Management Coordinator [phone]

## 2022-01-23 ENCOUNTER — Inpatient Hospital Stay: Payer: Medicare Other

## 2022-01-23 ENCOUNTER — Inpatient Hospital Stay (HOSPITAL_BASED_OUTPATIENT_CLINIC_OR_DEPARTMENT_OTHER): Payer: Medicare Other | Admitting: Hospice and Palliative Medicine

## 2022-01-23 ENCOUNTER — Other Ambulatory Visit: Payer: Self-pay

## 2022-01-23 VITALS — BP 113/62 | HR 92 | Temp 97.8°F | Resp 18 | Ht 65.0 in | Wt 159.0 lb

## 2022-01-23 DIAGNOSIS — Z452 Encounter for adjustment and management of vascular access device: Secondary | ICD-10-CM | POA: Diagnosis not present

## 2022-01-23 DIAGNOSIS — C3411 Malignant neoplasm of upper lobe, right bronchus or lung: Secondary | ICD-10-CM

## 2022-01-23 DIAGNOSIS — R7401 Elevation of levels of liver transaminase levels: Secondary | ICD-10-CM | POA: Diagnosis not present

## 2022-01-23 DIAGNOSIS — R531 Weakness: Secondary | ICD-10-CM | POA: Diagnosis not present

## 2022-01-23 DIAGNOSIS — E876 Hypokalemia: Secondary | ICD-10-CM | POA: Diagnosis not present

## 2022-01-23 DIAGNOSIS — K8020 Calculus of gallbladder without cholecystitis without obstruction: Secondary | ICD-10-CM | POA: Diagnosis not present

## 2022-01-23 LAB — COMPREHENSIVE METABOLIC PANEL
ALT: 30 U/L (ref 0–44)
AST: 29 U/L (ref 15–41)
Albumin: 3.5 g/dL (ref 3.5–5.0)
Alkaline Phosphatase: 86 U/L (ref 38–126)
Anion gap: 9 (ref 5–15)
BUN: 15 mg/dL (ref 8–23)
CO2: 23 mmol/L (ref 22–32)
Calcium: 8.6 mg/dL — ABNORMAL LOW (ref 8.9–10.3)
Chloride: 102 mmol/L (ref 98–111)
Creatinine, Ser: 1.07 mg/dL (ref 0.61–1.24)
GFR, Estimated: >60 mL/min (ref >60)
Glucose, Bld: 135 mg/dL — ABNORMAL HIGH (ref 70–99)
Potassium: 3.9 mmol/L (ref 3.5–5.1)
Sodium: 134 mmol/L — ABNORMAL LOW (ref 135–145)
Total Bilirubin: 0.6 mg/dL (ref 0.3–1.2)
Total Protein: 7 g/dL (ref 6.5–8.1)

## 2022-01-23 LAB — CBC WITH DIFFERENTIAL/PLATELET
Abs Immature Granulocytes: 0.03 10*3/uL (ref 0.00–0.07)
Basophils Absolute: 0 10*3/uL (ref 0.0–0.1)
Basophils Relative: 1 %
Eosinophils Absolute: 0.2 10*3/uL (ref 0.0–0.5)
Eosinophils Relative: 3 %
HCT: 36.8 % — ABNORMAL LOW (ref 39.0–52.0)
Hemoglobin: 11.6 g/dL — ABNORMAL LOW (ref 13.0–17.0)
Immature Granulocytes: 1 %
Lymphocytes Relative: 12 %
Lymphs Abs: 0.8 10*3/uL (ref 0.7–4.0)
MCH: 29.7 pg (ref 26.0–34.0)
MCHC: 31.5 g/dL (ref 30.0–36.0)
MCV: 94.1 fL (ref 80.0–100.0)
Monocytes Absolute: 0.7 10*3/uL (ref 0.1–1.0)
Monocytes Relative: 10 %
Neutro Abs: 4.9 10*3/uL (ref 1.7–7.7)
Neutrophils Relative %: 73 %
Platelets: 224 10*3/uL (ref 150–400)
RBC: 3.91 MIL/uL — ABNORMAL LOW (ref 4.22–5.81)
RDW: 17.5 % — ABNORMAL HIGH (ref 11.5–15.5)
WBC: 6.6 10*3/uL (ref 4.0–10.5)
nRBC: 0 % (ref 0.0–0.2)

## 2022-01-23 NOTE — Progress Notes (Signed)
Symptom Management [name] at [facility] [phone]) [phone] [phone]  Patient Care Team: Virginia Crews, MD as PCP - General (Family Medicine) Thelma Comp, [place] as Consulting Physician (Optometry) Royston Cowper, MD as Consulting Physician (Urology) Yolonda Kida, MD as Consulting Physician (Cardiology) Telford Nab, RN as Oncology Nurse Navigator Valente David, RN as [place] Management   NAME OF PATIENT: Luke Rojas  [id number]  05-29-1945   DATE OF VISIT: 01/23/22  REASON FOR CONSULT: Luke Rojas is a 76 y.o. male with multiple medical problems including stage II squamous cell lung cancer, prior history of smoking, history of spinal stenosis.   Patient is s/p CarboTaxol chemotherapy.  INTERVAL HISTORY: Patient last saw Dr. Janese Banks on 12/09/2018.  CT of the chest, abdomen, and pelvis on 12/05/2021 was suggestive of reduction in size of the right upper lobe lung mass and no other evidence of progressive disease.  Moderate biliary distention suggestive of choledocholithiasis but patient was asymptomatic and LFTs were normal.  MRCP on 12/15/2021 showed mild bile duct dilation with 3 small gallstones near the ampulla but none in the gallbladder.  At last visit with Dr. Janese Banks, patient was felt to be quite frail and had some ongoing shortness of breath.  There was no plan to offer maintenance immunotherapy and plan was for surveillance.  Patient subsequently had follow-up with PCP and was treated for COPD exacerbation.  Chest x-ray on 10/16 did not show any acute cardiopulmonary disease.  Patient was seen in [facility] on 01/08/2022 to follow-up on symptomatic complaints.  He was still feeling weak at that visit with some ongoing slow to improve shortness of breath.  He was noted to be hypokalemic on labs.    Today, patient presents for follow-up and recheck of labs.  He now says that he is slowly  improving and feeling better.  He is more mobile with his cane and denies recent falls.  He says that he is using his home nebulizer and that has improved his shortness of breath.  He denies any changes or specific concerns today.    PAST MEDICAL HISTORY: Past Medical History:  Diagnosis Date   Arthritis    OSTEOARTHRITIS   Chronic kidney disease    kidney stones   COPD (chronic obstructive pulmonary disease) (HCC)    Coronary artery disease    2 stents    Diabetes mellitus without complication (HCC)    GERD (gastroesophageal reflux disease)    Hypercholesteremia    Hypertension    Lung cancer ([place])    Myocardial infarction ([place]) 2248   Renal colic     PAST SURGICAL HISTORY:  Past Surgical History:  Procedure Laterality Date   CARDIAC CATHETERIZATION  2013   2 stents   CAROTID PTA/STENT INTERVENTION Right 09/11/2021   Procedure: CAROTID PTA/STENT INTERVENTION;  Surgeon: Algernon Huxley, MD;  Location: [facility];  Service: Cardiovascular;  Laterality: Right;   CATARACT EXTRACTION, BILATERAL     COLONOSCOPY  04/27/05   COLONOSCOPY WITH PROPOFOL N/A 06/08/2015   Procedure: COLONOSCOPY WITH PROPOFOL;  Surgeon: Robert Bellow, MD;  Location: [facility];  Service: Endoscopy;  Laterality: N/A;   CYSTOSCOPY WITH INSERTION OF UROLIFT N/A 02/11/2020   Procedure: CYSTOSCOPY WITH INSERTION OF UROLIFT;  Surgeon: Royston Cowper, MD;  Location: [facility];  Service: Urology;  Laterality: N/A;   CYSTOSCOPY WITH STENT PLACEMENT Right 09/16/2016   Procedure: CYSTOSCOPY WITH STENT PLACEMENT;  Surgeon: Maryan Puls  R, MD;  Location: [facility];  Service: Urology;  Laterality: Right;   EXTRACORPOREAL SHOCK WAVE LITHOTRIPSY Left 11/04/2014   has had 2 previous lithotripsies   EXTRACORPOREAL SHOCK WAVE LITHOTRIPSY Left 03/24/2015   Procedure: EXTRACORPOREAL SHOCK WAVE LITHOTRIPSY (ESWL);  Surgeon: Royston Cowper, MD;  Location: [facility];  Service: Urology;  Laterality: Left;   EXTRACORPOREAL  SHOCK WAVE LITHOTRIPSY Right 10/04/2016   Procedure: EXTRACORPOREAL SHOCK WAVE LITHOTRIPSY (ESWL);  Surgeon: Royston Cowper, MD;  Location: [facility];  Service: Urology;  Laterality: Right;   LEFT HEART CATH AND CORONARY ANGIOGRAPHY N/A 12/05/2018   Procedure: LEFT HEART CATH AND CORONARY ANGIOGRAPHY with possible pci and stent;  Surgeon: Yolonda Kida, MD;  Location: [facility];  Service: Cardiovascular;  Laterality: N/A;   PORTA CATH INSERTION Right 09/25/2021   Procedure: PORTA CATH INSERTION;  Surgeon: Algernon Huxley, MD;  Location: [facility];  Service: Cardiovascular;  Laterality: Right;    HEMATOLOGY/ONCOLOGY HISTORY:  Oncology History  Malignant neoplasm of upper lobe of right lung ([place])  09/18/2021 Initial Diagnosis   Malignant neoplasm of upper lobe of right lung ([place])   09/18/2021 Cancer Staging   Staging form: Lung, AJCC 8th Edition - Clinical stage from 09/18/2021: Stage IIB (cT2, cN1, cM0) - Signed by Sindy Guadeloupe, MD on 09/18/2021 Histopathologic type: Squamous cell carcinoma, NOS   10/02/2021 - 10/23/2021 Chemotherapy   Patient is on Treatment Plan : LUNG Carboplatin / Paclitaxel + XRT q7d     10/30/2021 - 11/15/2021 Chemotherapy   Patient is on Treatment Plan : LUNG Carboplatin + Paclitaxel + XRT q7d       ALLERGIES:  is allergic to codeine.  MEDICATIONS:  Current Outpatient Medications  Medication Sig Dispense Refill   albuterol (PROVENTIL) (2.5 MG/3ML) 0.083% nebulizer solution Take 3 mLs (2.5 mg total) by nebulization every 6 (six) hours as needed for wheezing or shortness of breath. 150 mL 1   aspirin EC 81 MG tablet Take 1 tablet (81 mg total) by mouth daily. Swallow whole. 30 tablet 12   atorvastatin (LIPITOR) 80 MG tablet TAKE 1 TABLET BY MOUTH EVERY NIGHT AT BEDTIME 90 tablet 3   benzonatate (TESSALON) 100 MG capsule Take 1 capsule (100 mg total) by mouth 2 (two) times daily as needed for cough. 20 capsule 0   clopidogrel (PLAVIX) 75 MG  tablet Take 75 mg by mouth daily.     co-enzyme Q-10 30 MG capsule Take 30 mg by mouth daily.     Cranberry-Vitamin C-Probiotic (AZO CRANBERRY PO) Take 2 tablets by mouth daily at 12 noon.     diclofenac sodium (VOLTAREN) 1 % GEL Apply 2 g topically 4 (four) times daily. 100 g 3   Krill Oil 1000 MG CAPS Take 1,000 mg by mouth daily.      metoprolol tartrate (LOPRESSOR) 25 MG tablet Take 1 tablet (25 mg total) by mouth 2 (two) times daily. (Patient taking differently: Take 25 mg by mouth daily at 12 noon.) 60 tablet 2   MULTIPLE VITAMIN PO Take 1 tablet by mouth daily.     OneTouch Delica Lancets 71I MISC TEST FASTING GLUCOSE LEVEL EACH MORNING BEFORE BREAKFAST 100 each 4   pantoprazole (PROTONIX) 40 MG tablet Take 40 mg by mouth daily.     potassium chloride SA (KLOR-CON M) 20 MEQ tablet Take 1 tablet (20 mEq total) by mouth daily. 14 tablet 0   ramipril (ALTACE) 10 MG capsule Take 1 capsule (10 mg  total) by mouth daily. 30 capsule 2   tamsulosin (FLOMAX) 0.4 MG CAPS capsule Take 0.4 mg by mouth daily.     No current facility-administered medications for this visit.    VITAL SIGNS: There were no vitals taken for this visit. There were no vitals filed for this visit.  Estimated body mass index is 26.13 kg/m as calculated from the following:   Height as of 01/08/22: 5\' 5"  (1.651 m).   Weight as of 01/16/22: 157 lb (71.2 kg).  LABS: CBC:    Component Value Date/Time   WBC 6.3 01/08/2022 1009   HGB 10.6 (L) 01/08/2022 1009   HGB 14.1 12/04/2019 0810   HCT 32.9 (L) 01/08/2022 1009   HCT 42.4 12/04/2019 0810   PLT 260 01/08/2022 1009   PLT 186 12/04/2019 0810   MCV 92.2 01/08/2022 1009   MCV 89 12/04/2019 0810   MCV 88 10/02/2011 1051   NEUTROABS 4.8 01/08/2022 1009   NEUTROABS 8.9 (H) 12/04/2019 0810   LYMPHSABS 0.6 (L) 01/08/2022 1009   LYMPHSABS 3.2 (H) 12/04/2019 0810   MONOABS 0.6 01/08/2022 1009   EOSABS 0.2 01/08/2022 1009   EOSABS 0.0 12/04/2019 0810   BASOSABS 0.0  01/08/2022 1009   BASOSABS 0.0 12/04/2019 0810   Comprehensive Metabolic Panel:    Component Value Date/Time   NA 137 01/08/2022 1009   NA 139 04/04/2021 0926   NA 139 10/02/2011 1051   K 3.2 (L) 01/08/2022 1009   K 4.2 10/02/2011 1051   CL 107 01/08/2022 1009   CL 106 10/02/2011 1051   CO2 22 01/08/2022 1009   CO2 27 10/02/2011 1051   BUN 10 01/08/2022 1009   BUN 19 04/04/2021 0926   BUN 21 (H) 10/02/2011 1051   CREATININE 0.93 01/08/2022 1009   CREATININE 1.88 (H) 10/02/2011 1051   GLUCOSE 118 (H) 01/08/2022 1009   GLUCOSE 97 10/02/2011 1051   CALCIUM 8.6 (L) 01/08/2022 1009   CALCIUM 8.4 (L) 10/02/2011 1051   AST 52 (H) 01/08/2022 1009   AST 25 09/21/2011 1245   ALT 51 (H) 01/08/2022 1009   ALT 34 09/21/2011 1245   ALKPHOS 88 01/08/2022 1009   ALKPHOS 97 09/21/2011 1245   BILITOT 0.4 01/08/2022 1009   BILITOT 0.5 04/04/2021 0926   BILITOT 0.6 09/21/2011 1245   PROT 6.5 01/08/2022 1009   PROT 6.6 04/04/2021 0926   PROT 7.7 09/21/2011 1245   ALBUMIN 3.1 (L) 01/08/2022 1009   ALBUMIN 4.0 04/04/2021 0926   ALBUMIN 4.0 09/21/2011 1245    RADIOGRAPHIC STUDIES: VAS US CAROTID  Result Date: 01/22/2022 Carotid Arterial Duplex Study Patient Name:  LAYMOND POSTLE  Date of Exam:   01/16/2022 Medical Rec #: [id number]           Accession #:    [id number] Date of Birth: November 12, 1945          Patient Gender: M Patient Age:   76 years Exam Location:  [facility] Procedure:      VAS US CAROTID Referring Phys: Eulogio Ditch --------------------------------------------------------------------------------  Indications:  Carotid artery disease and Right stent. Risk Factors: Hypertension, hyperlipidemia, coronary artery disease. Performing Technologist: Concha Norway RVT  Examination Guidelines: A complete evaluation includes B-mode imaging, spectral Doppler, color Doppler, and power Doppler as needed of all accessible portions of each vessel. Bilateral testing is considered an  integral part of a complete examination. Limited examinations for reoccurring indications may be performed as noted.  Right Carotid Findings: +----------+--------+--------+--------+------------------+--------+  PSV cm/sEDV cm/sStenosisPlaque DescriptionComments +----------+--------+--------+--------+------------------+--------+ CCA Prox  90      21                                         +----------+--------+--------+--------+------------------+--------+ CCA Mid   91      16                                         +----------+--------+--------+--------+------------------+--------+ CCA Distal63      11                                stent    +----------+--------+--------+--------+------------------+--------+ ICA Prox  75      22                                stent    +----------+--------+--------+--------+------------------+--------+ ICA Mid   84      25                                stent    +----------+--------+--------+--------+------------------+--------+ ICA Distal93      25                                         +----------+--------+--------+--------+------------------+--------+ ECA       183     5                                          +----------+--------+--------+--------+------------------+--------+ +----------+--------+-------+----------------+-------------------+           PSV cm/sEDV cmsDescribe        Arm Pressure (mmHG) +----------+--------+-------+----------------+-------------------+ [id number]             Multiphasic, WNL                    +----------+--------+-------+----------------+-------------------+ +---------+--------+--+--------+--+---------+ VertebralPSV cm/s67EDV cm/s21Antegrade +---------+--------+--+--------+--+---------+  Left Carotid Findings: +----------+--------+--------+--------+------------------+--------+           PSV cm/sEDV cm/sStenosisPlaque DescriptionComments  +----------+--------+--------+--------+------------------+--------+ CCA Prox  101     18                                         +----------+--------+--------+--------+------------------+--------+ CCA Mid   88      18                                         +----------+--------+--------+--------+------------------+--------+ CCA Distal82      24                                         +----------+--------+--------+--------+------------------+--------+ ICA Prox  63      24  1-39%   calcific                   +----------+--------+--------+--------+------------------+--------+ ICA Mid   63      30                                         +----------+--------+--------+--------+------------------+--------+ ICA Distal76      24                                         +----------+--------+--------+--------+------------------+--------+ ECA       134     8                                          +----------+--------+--------+--------+------------------+--------+ +----------+--------+--------+----------------+-------------------+           PSV cm/sEDV cm/sDescribe        Arm Pressure (mmHG) +----------+--------+--------+----------------+-------------------+ [id number]              Multiphasic, WNL                    +----------+--------+--------+----------------+-------------------+ +---------+--------+--+--------+--+---------+ VertebralPSV cm/s44EDV cm/s10Antegrade +---------+--------+--+--------+--+---------+   Summary: Right Carotid: There is no evidence of stenosis in the right ICA. Patent ICA                with no stenosis. Left Carotid: Velocities in the left ICA are consistent with a 1-39% stenosis. Vertebrals:  Bilateral vertebral arteries demonstrate antegrade flow. Subclavians: Normal flow hemodynamics were seen in bilateral subclavian              arteries. *See table(s) above for measurements and observations.  Electronically signed by Leotis Pain MD on 01/22/2022 at 1:03:20 PM.    Final     PERFORMANCE STATUS (ECOG) : 2 - Symptomatic, <50% confined to bed  Review of Systems Unless otherwise noted, a complete review of systems is negative.  Physical Exam General: NAD Cardiovascular: regular rate and rhythm Pulmonary: clear ant fields Abdomen: soft, nontender, + bowel sounds GU: no suprapubic tenderness Extremities: no edema, no joint deformities Skin: no rashes Neurological: Weakness but otherwise nonfocal  IMPRESSION/PLAN: Shortness of breath -recent CT showed decreased size of right upper lobe mass without any acute changes.  Recent chest x-ray did not show any acute cardiopulmonary disease.  Patient is being followed by PCP for recent COPD exacerbation.  Patient's symptoms seem to be improving.  Discussed option of referral to pulmonology if needed.  We will have nurse navigator call patient in 2 weeks for symptom check.  Weakness -improving.   Hypokalemia -serum potassium has normalized.  Oral intake has improved and so I suspect that patient is okay off KCl supplementation at this point.  Follow-up labs at next clinic visit.  Transaminitis -recent MRCP showed small nonobstructing gallstones.  LFTs normal today.  RTC in 1 month to see Dr. Janese Banks  Patient expressed understanding and was in agreement with this plan. He also understands that He can call clinic at any time with any questions, concerns, or complaints.   Thank you for allowing me to participate in the care of this very pleasant patient.   Time Total: 20 minutes  Visit consisted of counseling and  education dealing with the complex and emotionally intense issues of symptom management in the setting of serious illness.Greater than 50%  of this time was spent counseling and coordinating care related to the above assessment and plan.  Signed by: Altha Harm, PhD, NP-C

## 2022-01-29 ENCOUNTER — Emergency Department: Payer: Medicare Other

## 2022-01-29 ENCOUNTER — Other Ambulatory Visit: Payer: Self-pay

## 2022-01-29 ENCOUNTER — Emergency Department
Admission: EM | Admit: 2022-01-29 | Discharge: 2022-01-29 | Disposition: A | Discharge status: Home / Self Care | Payer: Medicare Other | Attending: Emergency Medicine | Admitting: Emergency Medicine

## 2022-01-29 ENCOUNTER — Ambulatory Visit: Payer: Self-pay

## 2022-01-29 DIAGNOSIS — J449 Chronic obstructive pulmonary disease, unspecified: Secondary | ICD-10-CM | POA: Diagnosis not present

## 2022-01-29 DIAGNOSIS — E119 Type 2 diabetes mellitus without complications: Secondary | ICD-10-CM | POA: Insufficient documentation

## 2022-01-29 DIAGNOSIS — I10 Essential (primary) hypertension: Secondary | ICD-10-CM | POA: Diagnosis not present

## 2022-01-29 DIAGNOSIS — R0602 Shortness of breath: Secondary | ICD-10-CM

## 2022-01-29 DIAGNOSIS — C3490 Malignant neoplasm of unspecified part of unspecified bronchus or lung: Secondary | ICD-10-CM | POA: Diagnosis not present

## 2022-01-29 DIAGNOSIS — C3411 Malignant neoplasm of upper lobe, right bronchus or lung: Secondary | ICD-10-CM | POA: Diagnosis not present

## 2022-01-29 LAB — CBC WITH DIFFERENTIAL/PLATELET
Abs Immature Granulocytes: 0.02 10*3/uL (ref 0.00–0.07)
Basophils Absolute: 0 10*3/uL (ref 0.0–0.1)
Basophils Relative: 1 %
Eosinophils Absolute: 0.3 10*3/uL (ref 0.0–0.5)
Eosinophils Relative: 4 %
HCT: 35.6 % — ABNORMAL LOW (ref 39.0–52.0)
Hemoglobin: 11.7 g/dL — ABNORMAL LOW (ref 13.0–17.0)
Immature Granulocytes: 0 %
Lymphocytes Relative: 13 %
Lymphs Abs: 0.9 10*3/uL (ref 0.7–4.0)
MCH: 30.2 pg (ref 26.0–34.0)
MCHC: 32.9 g/dL (ref 30.0–36.0)
MCV: 92 fL (ref 80.0–100.0)
Monocytes Absolute: 0.7 10*3/uL (ref 0.1–1.0)
Monocytes Relative: 11 %
Neutro Abs: 4.9 10*3/uL (ref 1.7–7.7)
Neutrophils Relative %: 71 %
Platelets: 245 10*3/uL (ref 150–400)
RBC: 3.87 MIL/uL — ABNORMAL LOW (ref 4.22–5.81)
RDW: 16.9 % — ABNORMAL HIGH (ref 11.5–15.5)
WBC: 6.9 10*3/uL (ref 4.0–10.5)
nRBC: 0 % (ref 0.0–0.2)

## 2022-01-29 LAB — BASIC METABOLIC PANEL
Anion gap: 9 (ref 5–15)
BUN: 16 mg/dL (ref 8–23)
CO2: 23 mmol/L (ref 22–32)
Calcium: 8.7 mg/dL — ABNORMAL LOW (ref 8.9–10.3)
Chloride: 107 mmol/L (ref 98–111)
Creatinine, Ser: 0.91 mg/dL (ref 0.61–1.24)
GFR, Estimated: >60 mL/min (ref >60)
Glucose, Bld: 111 mg/dL — ABNORMAL HIGH (ref 70–99)
Potassium: 3.8 mmol/L (ref 3.5–5.1)
Sodium: 139 mmol/L (ref 135–145)

## 2022-01-29 LAB — TROPONIN I (HIGH SENSITIVITY): Troponin I (High Sensitivity): 6 ng/L (ref <18)

## 2022-01-29 MED ORDER — HEPARIN SOD (PORK) LOCK FLUSH 100 UNIT/ML IV SOLN
500.0000 [IU] | Freq: Once | INTRAVENOUS | Status: AC
  Administered 2022-01-29: 500 [IU] via INTRAVENOUS
  Filled 2022-01-29: qty 5

## 2022-01-29 MED ORDER — ALBUTEROL SULFATE (2.5 MG/3ML) 0.083% IN NEBU: 3.0000 mL | INHALATION_SOLUTION | RESPIRATORY_TRACT | Status: DC | PRN

## 2022-01-29 MED ORDER — IOHEXOL 350 MG/ML SOLN
75.0000 mL | Freq: Once | INTRAVENOUS | Status: AC | PRN
  Administered 2022-01-29: 75 mL via INTRAVENOUS

## 2022-01-29 MED ORDER — LIDOCAINE-PRILOCAINE 2.5-2.5 % EX CREA
TOPICAL_CREAM | CUTANEOUS | Status: AC
  Filled 2022-01-29: qty 5

## 2022-01-29 MED ORDER — DOXYCYCLINE HYCLATE 100 MG PO CAPS: 100.0000 mg | ORAL_CAPSULE | Freq: Two times a day (BID) | ORAL | 0 refills | Status: AC

## 2022-01-29 NOTE — ED Provider Triage Note (Signed)
Emergency Medicine Provider Triage Evaluation Note  Luke Rojas , a 76 y.o. male  was evaluated in triage.  Pt complains of feeling short of breath for over a month. History of lung cancer treated with radiation. No fever or cough.  Physical Exam  BP 115/71 (BP Location: Right Arm)   Pulse 83   Temp 98.5 F (36.9 C) (Oral)   Resp (!) 26   Ht 5\' 5"  (1.651 m)   Wt 72.1 kg   SpO2 99%   BMI 26.46 kg/m  Gen:   Awake, no distress   Resp:  Normal effort. Speaking in complete sentences. MSK:   Moves extremities without difficulty  Other:    Medical Decision Making  Medically screening exam initiated at 12:42 PM.  Appropriate orders placed.  Luke Rojas was informed that the remainder of the evaluation will be completed by another provider, this initial triage assessment does not replace that evaluation, and the importance of remaining in the ED until their evaluation is complete.  O2 99% on room air.   Victorino Dike, FNP 01/29/22 1243

## 2022-01-29 NOTE — Telephone Encounter (Signed)
Chief Complaint: SOB, wheezing Symptoms: Wheezing, no other symptoms Frequency: Ongoing since last OV 11/13 Pertinent Negatives: Patient denies other symptoms Disposition: [x] ED /[] Urgent Care (no appt availability in office) / [] Appointment(In office/virtual)/ []  [facility]/ [] Home Care/ [] Refused Recommended Disposition /[] [facility]/ []  Follow-up with PCP Additional Notes: Patient's wife called and says the patient reports the nebulizer is not working for SOB, has used it 30 times since 01/15/22 OV and SOB is not any better. SOB a little a rest, but worse when up moving. Oxygen saturation 98% room air resting. Advised first available OV is tomorrow. Patient wanted something done today, advised ED, he agrees.   Reason for Disposition  [1] Longstanding difficulty breathing AND [2] not responding to usual therapy  Answer Assessment - Initial Assessment Questions 1. RESPIRATORY STATUS: "Describe your breathing?" (e.g., wheezing, shortness of breath, unable to speak, severe coughing)      SOB, wheezing 2. ONSET: "When did this breathing problem begin?"      No better since 11/13 3. PATTERN "Does the difficult breathing come and go, or has it been constant since it started?"      Constant 4. SEVERITY: "How bad is your breathing?" (e.g., mild, moderate, severe)    - MILD: No SOB at rest, mild SOB with walking, speaks normally in sentences, can lie down, no retractions, pulse < 100.    - MODERATE: SOB at rest, SOB with minimal exertion and prefers to sit, cannot lie down flat, speaks in phrases, mild retractions, audible wheezing, pulse 100-120.    - SEVERE: Very SOB at rest, speaks in single words, struggling to breathe, sitting hunched forward, retractions, pulse > 120      Moderate 5. RECURRENT SYMPTOM: "Have you had difficulty breathing before?" If Yes, ask: "When was the last time?" and "What happened that time?"      Yes 01/15/22 OV 6. CARDIAC HISTORY: "Do you have  any history of heart disease?" (e.g., heart attack, angina, bypass surgery, angioplasty)      N/A 7. LUNG HISTORY: "Do you have any history of lung disease?"  (e.g., pulmonary embolus, asthma, emphysema)     No 8. CAUSE: "What do you think is causing the breathing problem?"      Not sure 9. OTHER SYMPTOMS: "Do you have any other symptoms? (e.g., dizziness, runny nose, cough, chest pain, fever)     No 10. O2 SATURATION MONITOR:  "Do you use an oxygen saturation monitor (pulse oximeter) at home?" If Yes, ask: "What is your reading (oxygen level) today?" "What is your usual oxygen saturation reading?" (e.g., 95%)       98%  Protocols used: Breathing Difficulty-A-AH

## 2022-01-29 NOTE — Telephone Encounter (Signed)
Noted  

## 2022-01-29 NOTE — ED Notes (Signed)
Accessed right chest port a cath without difficulty.  Pateint tolerated well--had used emla cream prior.

## 2022-01-29 NOTE — ED Provider Notes (Signed)
[facility] Provider Note    Event Date/Time   First MD Initiated Contact with Patient 01/29/22 1636     (approximate)   History   Chief Complaint: Shortness of Breath   HPI  Luke Rojas is a 76 y.o. male with a history of hypertension, diabetes, COPD, lung cancer on chemo and radiation followed by Dr. Janese Banks who comes ED complaining of shortness of breath and nonproductive cough which has been present and gradually worsening for a few weeks.  He thought symptoms were related to his most recent chemotherapy a month ago, but is not improving.  Tried home nebulizers without relief.  Denies chest pain.  Mild pleuritic discomfort.  No leg swelling.     Physical Exam   Triage Vital Signs: ED Triage Vitals  Enc Vitals Group     BP 01/29/22 1240 115/71     Pulse Rate 01/29/22 1240 83     Resp 01/29/22 1240 (!) 26     Temp 01/29/22 1240 98.5 F (36.9 C)     Temp Source 01/29/22 1240 Oral     SpO2 01/29/22 1240 99 %     Weight 01/29/22 1239 159 lb (72.1 kg)     Height 01/29/22 1239 5\' 5"  (1.651 m)     Head Circumference --      Peak Flow --      Pain Score 01/29/22 1239 0     Pain Loc --      Pain Edu? --      Excl. in [place]? --     Most recent vital signs: Vitals:   01/29/22 1638 01/29/22 1640  BP: 118/70   Pulse: 80   Resp: 20   Temp:  98 F (36.7 C)  SpO2: 99%     General: Awake, no distress.  CV:  Good peripheral perfusion.  Regular rate and rhythm.  Normal peripheral pulses. Resp:  Normal effort.  Slight end expiratory wheezing.  Normal expiratory phase.  No focal crackles. Abd:  No distention.  Soft nontender Other:  no lower extremity edema, no calf tenderness   ED Results / Procedures / Treatments   Labs (all labs ordered are listed, but only abnormal results are displayed) Labs Reviewed  BASIC METABOLIC PANEL - Abnormal; Notable for the following components:      Result Value   Glucose, Bld 111 (*)    Calcium 8.7 (*)     All other components within normal limits  CBC WITH DIFFERENTIAL/PLATELET - Abnormal; Notable for the following components:   RBC 3.87 (*)    Hemoglobin 11.7 (*)    HCT 35.6 (*)    RDW 16.9 (*)    All other components within normal limits  TROPONIN I (HIGH SENSITIVITY)     EKG Interpreted by me Normal sinus rhythm, rate of 86.  Normal axis intervals QRS ST segments and T waves   RADIOLOGY Chest x-ray interpreted by me, shows opacification in the right upper lobe consistent with his known malignancy.  Radiology report reviewed   PROCEDURES:  Procedures   MEDICATIONS ORDERED IN ED: Medications  albuterol (PROVENTIL) (2.5 MG/3ML) 0.083% nebulizer solution 3 mL (has no administration in time range)  heparin lock flush 100 unit/mL (has no administration in time range)  lidocaine-prilocaine (EMLA) cream ( Topical Given 01/29/22 1720)  iohexol (OMNIPAQUE) 350 MG/ML injection 75 mL (75 mLs Intravenous Contrast Given 01/29/22 1812)     IMPRESSION / MDM / ASSESSMENT AND PLAN / ED COURSE  I reviewed the triage vital signs and the nursing notes.                              Differential diagnosis includes, but is not limited to, pneumonia, pleural effusion, pulmonary edema, COPD exacerbation,  pulmonary embolism, electrolyte abnormality, anemia  Patient's presentation is most consistent with acute presentation with potential threat to life or bodily function.  Patient presents with persistent shortness of breath, worse with exertion for the past several weeks in the setting of known lung cancer on chemotherapy and radiation.  Initial work-up with labs and chest x-ray unrevealing.  Symptoms are noncardiac.  Vitals are unremarkable except for tachypnea.  Will obtain CT angiogram to evaluate for PE or occult pneumonia.  ----------------------------------------- 7:33 PM on 01/29/2022 -----------------------------------------  CTA chest negative for PE. Shows most likely post  radiation change, possible infectious infiltrate. No fever or leukocytosis. Pt is calm, not in distress, VS normal. Will rx doxy, does not require admission. stable for DC home. F/u pcp / onc.       FINAL CLINICAL IMPRESSION(S) / ED DIAGNOSES   Final diagnoses:  Shortness of breath  Malignant neoplasm of upper lobe of right lung ([place])     Rx / DC Orders   ED Discharge Orders          Ordered    doxycycline (VIBRAMYCIN) 100 MG capsule  2 times daily        01/29/22 1927             Note:  This document was prepared using Dragon voice recognition software and may include unintentional dictation errors.   Carrie Mew, MD 01/29/22 [phone]

## 2022-01-29 NOTE — ED Notes (Signed)
Port flushed with saline followed by heparin flush and deaccessed.

## 2022-01-29 NOTE — ED Triage Notes (Signed)
Reports chronic sob of breath and cough that is not productive.  No fevers and oxygen levels are 97-100%

## 2022-01-29 NOTE — ED Notes (Signed)
Patient unable to sign as signature pad is not working.

## 2022-02-06 ENCOUNTER — Encounter: Payer: Self-pay | Admitting: Physician Assistant

## 2022-02-06 ENCOUNTER — Ambulatory Visit: Payer: Self-pay | Admitting: *Deleted

## 2022-02-06 ENCOUNTER — Ambulatory Visit (INDEPENDENT_AMBULATORY_CARE_PROVIDER_SITE_OTHER): Payer: Medicare Other | Admitting: Physician Assistant

## 2022-02-06 VITALS — BP 115/69 | HR 91 | Wt 167.1 lb

## 2022-02-06 DIAGNOSIS — C3411 Malignant neoplasm of upper lobe, right bronchus or lung: Secondary | ICD-10-CM | POA: Diagnosis not present

## 2022-02-06 DIAGNOSIS — R059 Cough, unspecified: Secondary | ICD-10-CM

## 2022-02-06 DIAGNOSIS — R0602 Shortness of breath: Secondary | ICD-10-CM

## 2022-02-06 DIAGNOSIS — R0989 Other specified symptoms and signs involving the circulatory and respiratory systems: Secondary | ICD-10-CM | POA: Diagnosis not present

## 2022-02-06 DIAGNOSIS — J700 Acute pulmonary manifestations due to radiation: Secondary | ICD-10-CM | POA: Diagnosis not present

## 2022-02-06 NOTE — Patient Outreach (Signed)
  Care Coordination   Follow Up Visit Note   02/06/2022 Name: Luke Rojas MRN: [id number] DOB: 1946-02-03  Luke Rojas is a 76 y.o. year old male who sees Bacigalupo, Dionne Bucy, MD for primary care. I spoke with  Joycelyn Man by phone today.  What matters to the patients health and wellness today?  Continues to have shortness of breath, was seen in ED on 11/27.   Goals Addressed             This Visit's Progress    Management of COPD and lung cancer   Not on track    Care Coordination Interventions: Provided patient with basic written and verbal COPD education on self care/management/and exacerbation prevention Provided written and verbal instructions on pursed lip breathing and utilized returned demonstration as teach back Provided instruction about proper use of medications used for management of COPD including inhalers Advised patient to self assesses COPD action plan zone and make appointment with provider if in the yellow zone for 48 hours without improvement Completed course of antibiotics with no relief.  He had chest CT and x-ray that didn't show pneumonia Discussed use of nebulizers.  He has been using it, no relief of shortness of breath.  State he feels better when he is sitting, not moving or talking.  When he is mobile and talking, shortness of breath is evident Discussed provider's thought of completing pulmonary consult.  He has not seen PCP since his ED visit, encouraged to call office for appointment and request referral to pulmonary team. Appointment with cancer center on 1/9         SDOH assessments and interventions completed:  No     Care Coordination Interventions:  Yes, provided   Follow up plan: Follow up call scheduled for 12/21    Encounter Outcome:  Pt. Visit Completed   Luke David, RN, MSN, [facility] Care Management Coordinator [phone]

## 2022-02-06 NOTE — Telephone Encounter (Signed)
  Chief Complaint: SOB- not better with antibiotic- ED visit 11/27 Symptoms: Patient was seen in ED for SOB and treated with antibiotic. Patient states he does not seem to be getting better after he has finished his cancer treatment- nebulizer does not even help Frequency: 2 months, patient feels he is unable to get deep breath Pertinent Negatives: Patient denies dizziness, fever Disposition: [] ED /[] Urgent Care (no appt availability in office) / [x] Appointment(In office/virtual)/ []  [facility]/ [] Home Care/ [] Refused Recommended Disposition /[] [facility]/ []  Follow-up with PCP Additional Notes: Patient scheduled appointment in office for continue breathing difficulty   Reason for Disposition  [1] Longstanding difficulty breathing (e.g., CHF, COPD, emphysema) AND [2] WORSE than normal  Answer Assessment - Initial Assessment Questions 1. RESPIRATORY STATUS: "Describe your breathing?" (e.g., wheezing, shortness of breath, unable to speak, severe coughing)      Still having SOB- with exertion, talking 2. ONSET: "When did this breathing problem begin?"      2 months 3. PATTERN "Does the difficult breathing come and go, or has it been constant since it started?"      Constant- hard to take deep breath 4. SEVERITY: "How bad is your breathing?" (e.g., mild, moderate, severe)    - MILD: No SOB at rest, mild SOB with walking, speaks normally in sentences, can lie down, no retractions, pulse < 100.    - MODERATE: SOB at rest, SOB with minimal exertion and prefers to sit, cannot lie down flat, speaks in phrases, mild retractions, audible wheezing, pulse 100-120.    - SEVERE: Very SOB at rest, speaks in single words, struggling to breathe, sitting hunched forward, retractions, pulse > 120      moderate 5. RECURRENT SYMPTOM: "Have you had difficulty breathing before?" If Yes, ask: "When was the last time?" and "What happened that time?"      ED on 1 week ago 6. CARDIAC HISTORY:  "Do you have any history of heart disease?" (e.g., heart attack, angina, bypass surgery, angioplasty)      11/27- ED visit 7. LUNG HISTORY: "Do you have any history of lung disease?"  (e.g., pulmonary embolus, asthma, emphysema)     Lung cancer treatment 8. CAUSE: "What do you think is causing the breathing problem?"      Unsure- thought related to cancer treatment 9. OTHER SYMPTOMS: "Do you have any other symptoms? (e.g., dizziness, runny nose, cough, chest pain, fever)     Cough, chest congestion 10. O2 SATURATION MONITOR:  "Do you use an oxygen saturation monitor (pulse oximeter) at home?" If Yes, ask: "What is your reading (oxygen level) today?" "What is your usual oxygen saturation reading?" (e.g., 95%)       97%  Protocols used: Breathing Difficulty-A-AH

## 2022-02-06 NOTE — Patient Instructions (Signed)
Visit Information  Thank you for taking time to visit with me today. Please don't hesitate to contact me if I can be of assistance to you before our next scheduled telephone appointment.  Following are the goals we discussed today:  Call PCP office to discuss ongoing shortness of breath despite ED visit and antibiotics  Our next appointment is by telephone on 12/21  Please call the care guide team at [phone] if you need to cancel or reschedule your appointment.   Please call the Suicide and Crisis Lifeline: 988 call the [place] National Suicide Prevention Lifeline: 563-500-8945 or TTY: 947-223-6523 TTY 830-371-1372) to talk to a trained counselor call 1-800-273-TALK (toll free, 24 hour hotline) call 911 if you are experiencing a Mental Health or [place] or need someone to talk to.  Patient verbalizes understanding of instructions and care plan provided today and agrees to view in [place]. Active MyChart status and patient understanding of how to access instructions and care plan via MyChart confirmed with patient.     The patient has been provided with contact information for the care management team and has been advised to call with any health related questions or concerns.   Valente David, RN, MSN, [facility] Care Management Coordinator [phone]

## 2022-02-06 NOTE — Progress Notes (Unsigned)
I,[name],acting as a Education administrator for [employer], PA-C.,have documented all relevant documentation on the behalf of Luke Speak, PA-C,as directed by  [employer], PA-C while in the presence of [employer], PA-C.   Established patient visit   Patient: Luke Rojas   DOB: [birth year]   76 y.o. Male  MRN: [id number] Visit Date: 02/06/2022  Today's healthcare provider: [name], PA-C   No chief complaint on file.  Subjective    HPI  Follow up for Shortness of Breath  The patient was last seen for this 8 days ago at [facility]. Changes made at last visit include rx doxy. Has 2 days remaining after today.   He reports excellent compliance with treatment. He feels that condition is Unchanged. He is not having side effects.  Patient reports moderate SOB meaning SOB at rest, SOB with minimal exertion and prefers to sit, cannot lie down flat, speaks in phrases, mild retractions, audible wheezing, pulse 100-120. Patient also reports cough and chest congestion. Patient recently finished Lung cancer treatment  Per chart review, saw Dr. Jacinto Reap on 01/15/22 for SOB and was switched from MDI to nebs and advised pulm referral Pt was treated for COPD exacerbation in October. Chest XR on 1016 showed no acute cardiopulmonary disease. On 12/25/21 with Dr. Baruch Gouty, pt was started on a Z-Pak for sinus infection. At the visit with Dr. Janese Banks on , patient was "felt to be frail and had ongoing SOB." -----------------------------------------------------------------------------------------   Medications: Outpatient Medications Prior to Visit  Medication Sig   albuterol (PROVENTIL) (2.5 MG/3ML) 0.083% nebulizer solution Take 3 mLs (2.5 mg total) by nebulization every 6 (six) hours as needed for wheezing or shortness of breath.   aspirin EC 81 MG tablet Take 1 tablet (81 mg total) by mouth daily. Swallow whole.   atorvastatin (LIPITOR) 80 MG tablet TAKE 1 TABLET BY MOUTH EVERY NIGHT AT BEDTIME    benzonatate (TESSALON) 100 MG capsule Take 1 capsule (100 mg total) by mouth 2 (two) times daily as needed for cough.   clopidogrel (PLAVIX) 75 MG tablet Take 75 mg by mouth daily.   co-enzyme Q-10 30 MG capsule Take 30 mg by mouth daily.   Cranberry-Vitamin C-Probiotic (AZO CRANBERRY PO) Take 2 tablets by mouth daily at 12 noon.   diclofenac sodium (VOLTAREN) 1 % GEL Apply 2 g topically 4 (four) times daily.   doxycycline (VIBRAMYCIN) 100 MG capsule Take 1 capsule (100 mg total) by mouth 2 (two) times daily for 10 days.   Krill Oil 1000 MG CAPS Take 1,000 mg by mouth daily.    metoprolol tartrate (LOPRESSOR) 25 MG tablet Take 1 tablet (25 mg total) by mouth 2 (two) times daily. (Patient taking differently: Take 25 mg by mouth daily at 12 noon.)   MULTIPLE VITAMIN PO Take 1 tablet by mouth daily.   OneTouch Delica Lancets 65H MISC TEST FASTING GLUCOSE LEVEL EACH MORNING BEFORE BREAKFAST   pantoprazole (PROTONIX) 40 MG tablet Take 40 mg by mouth daily.   potassium chloride SA (KLOR-CON M) 20 MEQ tablet Take 1 tablet (20 mEq total) by mouth daily.   ramipril (ALTACE) 10 MG capsule Take 1 capsule (10 mg total) by mouth daily.   tamsulosin (FLOMAX) 0.4 MG CAPS capsule Take 0.4 mg by mouth daily.   No facility-administered medications prior to visit.    Review of Systems  Respiratory:  Positive for cough and shortness of breath.     {Labs  Heme  Chem  Endocrine  Serology  Results Review (optional):23779}   Objective    BP 115/69 (BP Location: Right Arm, Patient Position: Sitting, Cuff Size: Normal)   Pulse 91   Wt 167 lb 1.6 oz (75.8 kg)   SpO2 99%   BMI 27.81 kg/m  {Show previous vital signs (optional):23777}  Physical Exam Vitals reviewed.  Constitutional:      General: He is in acute distress.     Appearance: Normal appearance. He is not ill-appearing, toxic-appearing or diaphoretic.  HENT:     Head: Normocephalic and atraumatic.     Right Ear: Ear canal and external ear  normal. There is impacted cerumen.     Left Ear: Ear canal and external ear normal. There is impacted cerumen.     Nose: Congestion (mild) present. No rhinorrhea.     Mouth/Throat:     Pharynx: No oropharyngeal exudate or posterior oropharyngeal erythema.  Eyes:     General: No scleral icterus.       Right eye: No discharge.        Left eye: No discharge.     Extraocular Movements: Extraocular movements intact.     Conjunctiva/sclera: Conjunctivae normal.     Pupils: Pupils are equal, round, and reactive to light.  Cardiovascular:     Rate and Rhythm: Normal rate and regular rhythm.     Pulses: Normal pulses.     Heart sounds: Normal heart sounds. No murmur heard. Pulmonary:     Effort: Pulmonary effort is normal. No respiratory distress.     Breath sounds: Rales (R>L) present. No wheezing or rhonchi.  Chest:     Chest wall: No tenderness.  Abdominal:     General: Abdomen is flat. Bowel sounds are normal.     Palpations: Abdomen is soft.  Musculoskeletal:        General: Normal range of motion.     Cervical back: Normal range of motion and neck supple.     Right lower leg: No edema.     Left lower leg: No edema.  Lymphadenopathy:     Cervical: No cervical adenopathy.  Skin:    General: Skin is warm and dry.     Findings: No rash.  Neurological:     General: No focal deficit present.     Mental Status: He is alert and oriented to person, place, and time. Mental status is at baseline.  Psychiatric:        Behavior: Behavior normal.        Thought Content: Thought content normal.      No results found for any visits on 02/06/22.  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 Janese Banks and Dr. Donella Stade. Pt was advised to contact oncology and update them on his current  situation Currently on doxy CTA chest from 01/29/22 negative for PE  Showed most likely post radiation changes, possible infections infiltrate Vitals are normal. No fever, from CBC on 01/29/22 no leukocytosis Advised to continue doxy, start on a short course of prednisone His A1C from  was 5.5 - Ambulatory referral to Pulmonology Will reassess    The patient was advised to call back or seek an in-person evaluation if the symptoms worsen or if the condition fails to improve as anticipated.  I discussed the assessment and treatment plan with the patient. The patient was provided an opportunity  to ask questions and all were answered. The patient agreed with the plan and demonstrated an understanding of the instructions.  The entirety of the information documented in the History of Present Illness, Review of Systems and Physical Exam were personally obtained by me. Portions of this information were initially documented by the CMA and reviewed by me for thoroughness and accuracy.   Luke Rojas, [facility], [place] [phone] (phone) [phone] (fax)

## 2022-02-07 ENCOUNTER — Telehealth: Payer: Self-pay

## 2022-02-07 MED ORDER — PREDNISONE 20 MG PO TABS: 20.0000 mg | ORAL_TABLET | Freq: Two times a day (BID) | ORAL | 0 refills | Status: DC

## 2022-02-07 NOTE — Telephone Encounter (Signed)
     Patient  visit on 11/28  at [facility]    Have you been able to follow up with your primary care physician? Yes   The patient was or was not able to obtain any needed medicine or equipment. Yes   Are there diet recommendations that you are having difficulty following? Na   Patient expresses understanding of discharge instructions and education provided has no other needs at this time.  Yes     Dorlisa Savino [facility] Care Guide, [place], Care Management  [phone] [address], [place], [place] [address] Phone: [phone] Email: [email or web address]    

## 2022-02-09 ENCOUNTER — Institutional Professional Consult (permissible substitution): Payer: Medicare Other | Admitting: Student in an Organized Health Care Education/Training Program

## 2022-02-09 ENCOUNTER — Telehealth: Payer: Self-pay | Admitting: Family Medicine

## 2022-02-09 NOTE — Telephone Encounter (Signed)
Copied from [address] [phone]. Topic: Referral - Request for Referral >> Feb 09, 2022 10:42 AM Chapman Fitch wrote: Has patient seen PCP for this complaint? Yes  *If NO, is insurance requiring patient see PCP for this issue before PCP can refer them? Referral for which specialty: pulmonology  Preferred provider/office: [facility] clinic / Dr. Claudette Stapler if in network with [facility]  Reason for referral: they are accepting pts and pt would like to see if Dr. Jacinto Reap can get pt an appt / please call Mrs. Trott and advise

## 2022-02-09 NOTE — Telephone Encounter (Signed)
Referral sent to Dr Claudette Stapler. His office will contact pt Phone: [phone]

## 2022-02-09 NOTE — Telephone Encounter (Signed)
Parke Poisson please patient's preferences.

## 2022-02-12 NOTE — Telephone Encounter (Signed)
Pt's wife called saying she talked to the referred office and they told her that Dr. B would need to send over his office notes before and referral before they could make him an appt, She did not get the fax number.

## 2022-02-13 NOTE — Telephone Encounter (Signed)
Patient has been scheduled with specialist.

## 2022-02-19 DIAGNOSIS — J984 Other disorders of lung: Secondary | ICD-10-CM | POA: Diagnosis not present

## 2022-02-20 ENCOUNTER — Encounter: Payer: Self-pay | Admitting: *Deleted

## 2022-02-22 ENCOUNTER — Ambulatory Visit: Payer: Self-pay | Admitting: *Deleted

## 2022-02-22 NOTE — Patient Instructions (Signed)
Visit Information  Thank you for taking time to visit with me today. Please don't hesitate to contact me if I can be of assistance to you before our next scheduled telephone appointment.  Following are the goals we discussed today:  Continue using inhalers and nebulizers as indicated.   Our next appointment is by telephone on 2/9  Please call the care guide team at [phone] if you need to cancel or reschedule your appointment.   Please call the Suicide and Crisis Lifeline: 988 call the [place] National Suicide Prevention Lifeline: 8207673895 or TTY: (445)172-5950 TTY 239-406-1892) to talk to a trained counselor call 1-800-273-TALK (toll free, 24 hour hotline) call 911 if you are experiencing a Mental Health or [place] or need someone to talk to.  Patient verbalizes understanding of instructions and care plan provided today and agrees to view in [place]. Active MyChart status and patient understanding of how to access instructions and care plan via MyChart confirmed with patient.     The patient has been provided with contact information for the care management team and has been advised to call with any health related questions or concerns.   Valente David, RN, MSN, [facility] Care Management Care Management Coordinator [phone]

## 2022-02-22 NOTE — Patient Outreach (Signed)
  Care Coordination   Follow Up Visit Note   02/22/2022 Name: JAKARRI LESKO MRN: [id number] DOB: 1945-06-21  Lorne Skeens Doolin is a 76 y.o. year old male who sees Bacigalupo, Dionne Bucy, MD for primary care. I spoke with  Joycelyn Man by phone today.  What matters to the patients health and wellness today?  Feels lung infection is improving slightly, still has shortness of breath with activity.     Goals Addressed             This Visit's Progress    Management of COPD and lung cancer   On track    Care Coordination Interventions: Provided patient with basic written and verbal COPD education on self care/management/and exacerbation prevention Provided written and verbal instructions on pursed lip breathing and utilized returned demonstration as teach back Provided instruction about proper use of medications used for management of COPD including inhalers Advised patient to self assesses COPD action plan zone and make appointment with provider if in the yellow zone for 48 hours without improvement Discussed use of nebulizers.  Medication was changed after office visit with pulmonology.  Feels he is improving. Pulmonary visit was completed on 12/18, medications and inhalers were changed.  Will have repeat chest CT and xray in the next few weeks Appointment with cancer center on 1/9, pulmonary on 1/14, and PCP on 2/8         SDOH assessments and interventions completed:  No     Care Coordination Interventions:  Yes, provided   Follow up plan: Follow up call scheduled for 2/9    Encounter Outcome:  Pt. Visit Completed   Valente David, RN, MSN, [facility] Care Management Care Management Coordinator [phone]

## 2022-03-06 ENCOUNTER — Ambulatory Visit
Admission: RE | Admit: 2022-03-06 | Discharge: 2022-03-06 | Disposition: A | Discharge status: Home / Self Care | Payer: Medicare Other | Source: Ambulatory Visit | Attending: Oncology | Admitting: Oncology

## 2022-03-06 DIAGNOSIS — K429 Umbilical hernia without obstruction or gangrene: Secondary | ICD-10-CM | POA: Diagnosis not present

## 2022-03-06 DIAGNOSIS — C3411 Malignant neoplasm of upper lobe, right bronchus or lung: Secondary | ICD-10-CM

## 2022-03-06 DIAGNOSIS — C3490 Malignant neoplasm of unspecified part of unspecified bronchus or lung: Secondary | ICD-10-CM | POA: Diagnosis not present

## 2022-03-06 DIAGNOSIS — J479 Bronchiectasis, uncomplicated: Secondary | ICD-10-CM | POA: Diagnosis not present

## 2022-03-06 DIAGNOSIS — J929 Pleural plaque without asbestos: Secondary | ICD-10-CM | POA: Diagnosis not present

## 2022-03-06 DIAGNOSIS — I700 Atherosclerosis of aorta: Secondary | ICD-10-CM | POA: Diagnosis not present

## 2022-03-06 DIAGNOSIS — K5730 Diverticulosis of large intestine without perforation or abscess without bleeding: Secondary | ICD-10-CM | POA: Diagnosis not present

## 2022-03-06 DIAGNOSIS — N281 Cyst of kidney, acquired: Secondary | ICD-10-CM | POA: Diagnosis not present

## 2022-03-06 MED ORDER — HEPARIN SOD (PORK) LOCK FLUSH 100 UNIT/ML IV SOLN
INTRAVENOUS | Status: AC
  Administered 2022-03-06: 500 [IU]
  Filled 2022-03-06: qty 5

## 2022-03-06 MED ORDER — IOHEXOL 300 MG/ML  SOLN
100.0000 mL | Freq: Once | INTRAMUSCULAR | Status: AC | PRN
  Administered 2022-03-06: 100 mL via INTRAVENOUS

## 2022-03-09 ENCOUNTER — Telehealth (INDEPENDENT_AMBULATORY_CARE_PROVIDER_SITE_OTHER): Payer: Self-pay

## 2022-03-09 NOTE — Telephone Encounter (Signed)
He can but staying on both gives his stent a little better protection if he stays on both for a year (his surgery was 09/11/21)

## 2022-03-12 ENCOUNTER — Ambulatory Visit: Payer: Self-pay | Admitting: *Deleted

## 2022-03-12 NOTE — Telephone Encounter (Signed)
  Chief Complaint: fall , taking blood thinners Symptoms: fell on 3 steps Saturday slipped and fell on buttocks. Left buttocks bruised. Soreness , difficulty sitting and standing. Can walk. Taking plavix and ASA.  Frequency: 03/10/22 Pertinent Negatives: Patient denies chest pain no difficulty breathing. No fever.  Disposition: [] ED /[] Urgent Care (no appt availability in office) / [x] Appointment(In office/virtual)/ []  [facility] Virtual [facility]/ [] Home Care/ [] Refused Recommended Disposition /[] [facility]/ []  Follow-up with PCP Additional Notes:   [place] denies hitting head. Taking blood thinners plavix. Please advise if earlier appt needed.      Reason for Disposition  [1] Caller has NON-URGENT question AND [2] triager unable to answer question  Answer Assessment - Initial Assessment Questions 1. MECHANISM: "How did the fall happen?"     Foot slipped on 3 steps and fell on buttocks area 2. DOMESTIC VIOLENCE AND ELDER ABUSE SCREENING: "Did you fall because someone pushed you or tried to hurt you?" If Yes, ask: "Are you safe now?"     denies 3. ONSET: "When did the fall happen?" (e.g., minutes, hours, or days ago)     03/10/22 4. LOCATION: "What part of the body hit the ground?" (e.g., back, buttocks, head, hips, knees, hands, head, stomach)     Buttocks  5. INJURY: "Did you hurt (injure) yourself when you fell?" If Yes, ask: "What did you injure? Tell me more about this?" (e.g., body area; type of injury; pain severity)"     Bruise to left buttocks 6. PAIN: "Is there any pain?" If Yes, ask: "How bad is the pain?" (e.g., Scale 1-10; or mild,  moderate, severe)   - NONE (0): No pain   - MILD (1-3): Doesn't interfere with normal activities    - MODERATE (4-7): Interferes with normal activities or awakens from sleep    - SEVERE (8-10): Excruciating pain, unable to do any normal activities      Yes to left buttocks, difficulty sitting and getting up . Can walk 7. SIZE: For cuts,  bruises, or swelling, ask: "How large is it?" (e.g., inches or centimeters)      Bruised "big" whole left side of buttocks 8. PREGNANCY: "Is there any chance you are pregnant?" "When was your last menstrual period?"     na 9. OTHER SYMPTOMS: "Do you have any other symptoms?" (e.g., dizziness, fever, weakness; new onset or worsening).      Pain left buttocks 10. CAUSE: "What do you think caused the fall (or falling)?" (e.g., tripped, dizzy spell)       Slipped on steps raining  Protocols used: Falls and [facility]

## 2022-03-13 ENCOUNTER — Ambulatory Visit (INDEPENDENT_AMBULATORY_CARE_PROVIDER_SITE_OTHER): Payer: Medicare Other | Admitting: Physician Assistant

## 2022-03-13 ENCOUNTER — Telehealth: Payer: Self-pay

## 2022-03-13 ENCOUNTER — Encounter: Payer: Self-pay | Admitting: Oncology

## 2022-03-13 ENCOUNTER — Inpatient Hospital Stay (HOSPITAL_BASED_OUTPATIENT_CLINIC_OR_DEPARTMENT_OTHER): Payer: Medicare Other | Admitting: Oncology

## 2022-03-13 ENCOUNTER — Inpatient Hospital Stay: Payer: Medicare Other | Attending: Oncology

## 2022-03-13 ENCOUNTER — Encounter: Payer: Self-pay | Admitting: Physician Assistant

## 2022-03-13 VITALS — BP 146/80 | HR 94 | Temp 97.2°F | Resp 16 | Ht 65.0 in | Wt 166.2 lb

## 2022-03-13 VITALS — BP 134/65 | HR 118 | Temp 98.6°F | Resp 16 | Wt 165.4 lb

## 2022-03-13 DIAGNOSIS — T148XXA Other injury of unspecified body region, initial encounter: Secondary | ICD-10-CM | POA: Diagnosis not present

## 2022-03-13 DIAGNOSIS — Z08 Encounter for follow-up examination after completed treatment for malignant neoplasm: Secondary | ICD-10-CM | POA: Diagnosis not present

## 2022-03-13 DIAGNOSIS — Z452 Encounter for adjustment and management of vascular access device: Secondary | ICD-10-CM | POA: Insufficient documentation

## 2022-03-13 DIAGNOSIS — Z85118 Personal history of other malignant neoplasm of bronchus and lung: Secondary | ICD-10-CM | POA: Insufficient documentation

## 2022-03-13 DIAGNOSIS — Z95828 Presence of other vascular implants and grafts: Secondary | ICD-10-CM

## 2022-03-13 DIAGNOSIS — C3411 Malignant neoplasm of upper lobe, right bronchus or lung: Secondary | ICD-10-CM

## 2022-03-13 LAB — CBC WITH DIFFERENTIAL/PLATELET
Abs Immature Granulocytes: 0.03 10*3/uL (ref 0.00–0.07)
Basophils Absolute: 0 10*3/uL (ref 0.0–0.1)
Basophils Relative: 0 %
Eosinophils Absolute: 0.1 10*3/uL (ref 0.0–0.5)
Eosinophils Relative: 1 %
HCT: 39 % (ref 39.0–52.0)
Hemoglobin: 12.9 g/dL — ABNORMAL LOW (ref 13.0–17.0)
Immature Granulocytes: 0 %
Lymphocytes Relative: 11 %
Lymphs Abs: 1.1 10*3/uL (ref 0.7–4.0)
MCH: 29.7 pg (ref 26.0–34.0)
MCHC: 33.1 g/dL (ref 30.0–36.0)
MCV: 89.9 fL (ref 80.0–100.0)
Monocytes Absolute: 0.9 10*3/uL (ref 0.1–1.0)
Monocytes Relative: 9 %
Neutro Abs: 7.4 10*3/uL (ref 1.7–7.7)
Neutrophils Relative %: 79 %
Platelets: 118 10*3/uL — ABNORMAL LOW (ref 150–400)
RBC: 4.34 MIL/uL (ref 4.22–5.81)
RDW: 16.6 % — ABNORMAL HIGH (ref 11.5–15.5)
WBC: 9.4 10*3/uL (ref 4.0–10.5)
nRBC: 0 % (ref 0.0–0.2)

## 2022-03-13 LAB — COMPREHENSIVE METABOLIC PANEL
ALT: 32 U/L (ref 0–44)
AST: 24 U/L (ref 15–41)
Albumin: 3.6 g/dL (ref 3.5–5.0)
Alkaline Phosphatase: 55 U/L (ref 38–126)
Anion gap: 7 (ref 5–15)
BUN: 24 mg/dL — ABNORMAL HIGH (ref 8–23)
CO2: 26 mmol/L (ref 22–32)
Calcium: 8.5 mg/dL — ABNORMAL LOW (ref 8.9–10.3)
Chloride: 104 mmol/L (ref 98–111)
Creatinine, Ser: 1.16 mg/dL (ref 0.61–1.24)
GFR, Estimated: >60 mL/min (ref >60)
Glucose, Bld: 164 mg/dL — ABNORMAL HIGH (ref 70–99)
Potassium: 3.6 mmol/L (ref 3.5–5.1)
Sodium: 137 mmol/L (ref 135–145)
Total Bilirubin: 0.9 mg/dL (ref 0.3–1.2)
Total Protein: 5.9 g/dL — ABNORMAL LOW (ref 6.5–8.1)

## 2022-03-13 MED ORDER — HEPARIN SOD (PORK) LOCK FLUSH 100 UNIT/ML IV SOLN
500.0000 [IU] | Freq: Once | INTRAVENOUS | Status: AC
  Administered 2022-03-13: 500 [IU] via INTRAVENOUS
  Filled 2022-03-13: qty 5

## 2022-03-13 MED ORDER — SODIUM CHLORIDE 0.9% FLUSH
10.0000 mL | Freq: Once | INTRAVENOUS | Status: AC
  Administered 2022-03-13: 10 mL via INTRAVENOUS
  Filled 2022-03-13: qty 10

## 2022-03-13 NOTE — Telephone Encounter (Signed)
Patient made aware with medical recommendations and verbalized understanding 

## 2022-03-13 NOTE — Progress Notes (Signed)
I,Luke Rojas,acting as a scribe for [employer], PA-C.,have documented all relevant documentation on the behalf of Luke Kirschner, PA-C,as directed by  Luke Kirschner, PA-C while in the presence of Luke Kirschner, PA-C.   Established patient visit   Patient: Luke Rojas   DOB: [birth year]   77 y.o. Male  MRN: [id number] Visit Date: 03/13/2022  Today's healthcare provider: [name], PA-C   Chief Complaint  Patient presents with   Bleeding/Bruising   Subjective    HPI   Pt reports a fall down stairs 03/10/22 as they were slippery, landed on his bottom. Reports he was able to get up right away, did not hit his head. No pain or difficulty walking, but has a large painful bruise on his bottom which makes it difficult for him to sit. He reports some improvement over the past few days.   He wants to know if he can get rid of one of the blood thinners as he bruises and bleeds so easily now.  Medications: Outpatient Medications Prior to Visit  Medication Sig   albuterol (PROVENTIL) (2.5 MG/3ML) 0.083% nebulizer solution Take 3 mLs (2.5 mg total) by nebulization every 6 (six) hours as needed for wheezing or shortness of breath.   aspirin EC 81 MG tablet Take 1 tablet (81 mg total) by mouth daily. Swallow whole.   atorvastatin (LIPITOR) 80 MG tablet TAKE 1 TABLET BY MOUTH EVERY NIGHT AT BEDTIME   benzonatate (TESSALON) 100 MG capsule Take 1 capsule (100 mg total) by mouth 2 (two) times daily as needed for cough.   clopidogrel (PLAVIX) 75 MG tablet Take 75 mg by mouth daily.   co-enzyme Q-10 30 MG capsule Take 30 mg by mouth daily.   Cranberry-Vitamin C-Probiotic (AZO CRANBERRY PO) Take 2 tablets by mouth daily at 12 noon.   diclofenac sodium (VOLTAREN) 1 % GEL Apply 2 g topically 4 (four) times daily.   Krill Oil 1000 MG CAPS Take 1,000 mg by mouth daily.    metoprolol tartrate (LOPRESSOR) 25 MG tablet Take 1 tablet (25 mg total) by mouth 2 (two) times daily. (Patient  taking differently: Take 25 mg by mouth daily at 12 noon.)   MULTIPLE VITAMIN PO Take 1 tablet by mouth daily.   OneTouch Delica Lancets 57D MISC TEST FASTING GLUCOSE LEVEL EACH MORNING BEFORE BREAKFAST   pantoprazole (PROTONIX) 40 MG tablet Take 40 mg by mouth daily.   potassium chloride SA (KLOR-CON M) 20 MEQ tablet Take 1 tablet (20 mEq total) by mouth daily.   predniSONE (DELTASONE) 20 MG tablet Take 1 tablet (20 mg total) by mouth 2 (two) times daily with a meal.   ramipril (ALTACE) 10 MG capsule Take 1 capsule (10 mg total) by mouth daily.   tamsulosin (FLOMAX) 0.4 MG CAPS capsule Take 0.4 mg by mouth daily.   No facility-administered medications prior to visit.    Review of Systems  Constitutional:  Negative for fatigue and fever.  Respiratory:  Negative for cough and shortness of breath.   Cardiovascular:  Negative for chest pain, palpitations and leg swelling.  Skin:  Positive for color change.  Neurological:  Negative for dizziness and headaches.     Objective    BP 134/65 (BP Location: Left Arm, Patient Position: Sitting, Cuff Size: Normal)   Pulse (!) 118   Temp 98.6 F (37 C) (Oral)   Resp 16   Wt 165 lb 6.4 oz (75 kg)   BMI 27.52 kg/m   Physical  Exam Vitals reviewed.  Constitutional:      Appearance: He is not ill-appearing.  HENT:     Head: Normocephalic.  Eyes:     Conjunctiva/sclera: Conjunctivae normal.  Cardiovascular:     Rate and Rhythm: Normal rate.  Pulmonary:     Effort: Pulmonary effort is normal. No respiratory distress.  Skin:    Comments: Large hematoma across left buttocks down left thigh. Some tenderness along the left pelvis and hip.    Neurological:     General: No focal deficit present.     Mental Status: He is alert and oriented to person, place, and time.  Psychiatric:        Mood and Affect: Mood normal.        Behavior: Behavior normal.     Results for orders placed or performed in visit on 03/13/22  Comprehensive metabolic  panel  Result Value Ref Range   Sodium 137 135 - 145 mmol/L   Potassium 3.6 3.5 - 5.1 mmol/L   Chloride 104 98 - 111 mmol/L   CO2 26 22 - 32 mmol/L   Glucose, Bld 164 (H) 70 - 99 mg/dL   BUN 24 (H) 8 - 23 mg/dL   Creatinine, Ser 1.16 0.61 - 1.24 mg/dL   Calcium 8.5 (L) 8.9 - 10.3 mg/dL   Total Protein 5.9 (L) 6.5 - 8.1 g/dL   Albumin 3.6 3.5 - 5.0 g/dL   AST 24 15 - 41 U/L   ALT 32 0 - 44 U/L   Alkaline Phosphatase 55 38 - 126 U/L   Total Bilirubin 0.9 0.3 - 1.2 mg/dL   GFR, Estimated >60 >60 mL/min   Anion gap 7 5 - 15  CBC with Differential/Platelet  Result Value Ref Range   WBC 9.4 4.0 - 10.5 K/uL   RBC 4.34 4.22 - 5.81 MIL/uL   Hemoglobin 12.9 (L) 13.0 - 17.0 g/dL   HCT 39.0 39.0 - 52.0 %   MCV 89.9 80.0 - 100.0 fL   MCH 29.7 26.0 - 34.0 pg   MCHC 33.1 30.0 - 36.0 g/dL   RDW 16.6 (H) 11.5 - 15.5 %   Platelets 118 (L) 150 - 400 K/uL   nRBC 0.0 0.0 - 0.2 %   Neutrophils Relative % 79 %   Neutro Abs 7.4 1.7 - 7.7 K/uL   Lymphocytes Relative 11 %   Lymphs Abs 1.1 0.7 - 4.0 K/uL   Monocytes Relative 9 %   Monocytes Absolute 0.9 0.1 - 1.0 K/uL   Eosinophils Relative 1 %   Eosinophils Absolute 0.1 0.0 - 0.5 K/uL   Basophils Relative 0 %   Basophils Absolute 0.0 0.0 - 0.1 K/uL   Immature Granulocytes 0 %   Abs Immature Granulocytes 0.03 0.00 - 0.07 K/uL    Assessment & Plan     Hematoma Advised pt if he has any difficult with ROM of the left hip or pain while walking to call office for imaging  Otherwise ice locally. Advised I will contact his vascular surgeon about blood thinner recommendations   Return if symptoms worsen or fail to improve.      I, Luke Kirschner, PA-C have reviewed all documentation for this visit. The documentation on  03/13/22 for the exam, diagnosis, procedures, and orders are all accurate and complete.  Luke Kirschner, PA-C [facility] [address] [place], [place], [address] Office: [phone] Fax: [place]

## 2022-03-13 NOTE — Progress Notes (Signed)
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  He quit smoking about 30 years ago but still chews tobacco.  He presented to the ER with symptoms of neck pain which led to an MRI cervical spine as well as CT angio chest.  MRI cervical spine showed degenerative changes along with moderate spinal stenosis and mass effect between C3-C4 and C4-C5.  Foraminal stenosis at C3-C4 C5-C6-C7 nerve levels.  Nonspecific 10 mm T3 vertebral body lesion indeterminate for bone metastases.  CT angio chest showed a 4.5 x 4.9 x 3.7 cm right upper lobe peripheral mass with probable pleural involvement and potential early invasion into right middle lobe.  Borderline enlarged right hilar lymph nodes measuring up to 1.2 cm.   PET CT scan showed hypermetabolic 4.8 cm right upper lobe mass consistent with primary bronchogenic carcinoma and mild asymmetric FDG activity in the right suprahilar region suspicious for right hilar lymph node metastases.  Patient had a CT-guided right upper lobe lung biopsy which was consistent with squamous cell carcinoma.    Interval history-patient reports feeling better over the last 3 months  after Completing chemoradiation.  Appetite is improving and he has gained 8 pounds in the last 1 month.  Shortness of breath has improved.  He was seen by [facility] from pulmonary as well and was given a trial of steroids for possible radiation pneumonitis.  He feels that his breathing is much better after starting steroids.  ECOG PS- 2 Pain scale- 0   Review of systems- Review of Systems  Constitutional:  Negative for chills, fever, malaise/fatigue and weight loss.  HENT:  Negative for congestion, ear discharge and nosebleeds.   Eyes:  Negative for blurred vision.  Respiratory:  Positive for shortness of breath and wheezing. Negative for cough, hemoptysis and sputum production.   Cardiovascular:  Negative for chest pain, palpitations, orthopnea and claudication.  Gastrointestinal:  Negative for abdominal pain, blood in stool, constipation, diarrhea, heartburn, melena, nausea and vomiting.  Genitourinary:  Negative for dysuria, flank pain, frequency, hematuria and urgency.  Musculoskeletal:  Negative for back pain, joint pain and myalgias.  Skin:  Negative for rash.  Neurological:  Negative for dizziness, tingling, focal weakness, seizures, weakness and headaches.  Endo/Heme/Allergies:  Does not bruise/bleed easily.  Psychiatric/Behavioral:  Negative for depression and suicidal ideas. The patient does not have insomnia.       Allergies  Allergen Reactions   Codeine Nausea Only, Nausea And Vomiting and Other (See Comments)    Other reaction(s): Vomiting     Past Medical History:  Diagnosis Date   Arthritis    OSTEOARTHRITIS   Chronic kidney disease    kidney stones   COPD (chronic obstructive pulmonary disease) (HCC)    Coronary artery disease    2 stents  Diabetes mellitus without complication ([place])    GERD (gastroesophageal reflux disease)    Hypercholesteremia    Hypertension    Lung cancer ([place])    Myocardial infarction [facility]) [id number]   Renal colic      Past  Surgical History:  Procedure Laterality Date   CARDIAC CATHETERIZATION  2013   2 stents   CAROTID PTA/STENT INTERVENTION Right 09/11/2021   Procedure: CAROTID PTA/STENT INTERVENTION;  Surgeon: Algernon Huxley, MD;  Location: [facility];  Service: Cardiovascular;  Laterality: Right;   CATARACT EXTRACTION, BILATERAL     COLONOSCOPY  04/27/05   COLONOSCOPY WITH PROPOFOL N/A 06/08/2015   Procedure: COLONOSCOPY WITH PROPOFOL;  Surgeon: Robert Bellow, MD;  Location: [facility];  Service: Endoscopy;  Laterality: N/A;   CYSTOSCOPY WITH INSERTION OF UROLIFT N/A 02/11/2020   Procedure: CYSTOSCOPY WITH INSERTION OF UROLIFT;  Surgeon: Royston Cowper, MD;  Location: [facility];  Service: Urology;  Laterality: N/A;   CYSTOSCOPY WITH STENT PLACEMENT Right 09/16/2016   Procedure: CYSTOSCOPY WITH STENT PLACEMENT;  Surgeon: Royston Cowper, MD;  Location: [facility];  Service: Urology;  Laterality: Right;   EXTRACORPOREAL SHOCK WAVE LITHOTRIPSY Left 11/04/2014   has had 2 previous lithotripsies   EXTRACORPOREAL SHOCK WAVE LITHOTRIPSY Left 03/24/2015   Procedure: EXTRACORPOREAL SHOCK WAVE LITHOTRIPSY (ESWL);  Surgeon: Royston Cowper, MD;  Location: [facility];  Service: Urology;  Laterality: Left;   EXTRACORPOREAL SHOCK WAVE LITHOTRIPSY Right 10/04/2016   Procedure: EXTRACORPOREAL SHOCK WAVE LITHOTRIPSY (ESWL);  Surgeon: Royston Cowper, MD;  Location: [facility];  Service: Urology;  Laterality: Right;   LEFT HEART CATH AND CORONARY ANGIOGRAPHY N/A 12/05/2018   Procedure: LEFT HEART CATH AND CORONARY ANGIOGRAPHY with possible pci and stent;  Surgeon: Yolonda Kida, MD;  Location: [facility];  Service: Cardiovascular;  Laterality: N/A;   PORTA CATH INSERTION Right 09/25/2021   Procedure: PORTA CATH INSERTION;  Surgeon: Algernon Huxley, MD;  Location: [facility];  Service: Cardiovascular;  Laterality: Right;    Social History   Socioeconomic History   Marital status: Married    Spouse  name: Not on file   Number of children: 2   Years of education: Not on file   Highest education level: 8th grade  Occupational History   Occupation: retired  Tobacco Use   Smoking status: Former    Packs/day: 1.00    Years: 30.00    Total pack years: 30.00    Types: Cigarettes    Quit date: 03/05/1984    Years since quitting: 38.0   Smokeless tobacco: Current    Types: Chew   Tobacco comments:    Chews tobacco daily.  Vaping Use   Vaping Use: Never used  Substance and Sexual Activity   Alcohol use: No    Alcohol/week: 0.0 standard drinks of alcohol   Drug use: No   Sexual activity: Not Currently  Other Topics Concern   Not on file  Social History Narrative   Lives with Zigmund Daniel, wife, and son [place].   Social Determinants of Health   Financial Resource Strain: Low Risk  (04/07/2020)   Overall Financial Resource Strain (CARDIA)    Difficulty of Paying Living Expenses: Not hard at all  Food Insecurity: No Food Insecurity (01/01/2022)   Hunger Vital Sign    Worried About Running Out of Food in the Last Year: Never true    Ran Out of Food in the Last Year: Never true  Transportation Needs:  No Transportation Needs (01/01/2022)   PRAPARE - Hydrologist (Medical): No    Lack of Transportation (Non-Medical): No  Physical Activity: Inactive (04/07/2020)   Exercise Vital Sign    Days of Exercise per Week: 0 days    Minutes of Exercise per Session: 0 min  Stress: No Stress Concern Present (04/07/2020)   [place]    Feeling of Stress : Not at all  Social Connections: Moderately Integrated (04/07/2020)   Social Connection and Isolation Panel [NHANES]    Frequency of Communication with Friends and Family: More than three times a week    Frequency of Social Gatherings with Friends and Family: More than three times a week    Attends Religious Services: More than 4 times per year    Active Member of  [employer] or Organizations: No    Attends Archivist Meetings: Never    Marital Status: Married  Human resources officer Violence: Not At Risk (04/07/2020)   Humiliation, Afraid, Rape, and Kick questionnaire    Fear of Current or Ex-Partner: No    Emotionally Abused: No    Physically Abused: No    Sexually Abused: No    Family History  Problem Relation Age of Onset   Pulmonary embolism Mother    Transient ischemic attack Mother    Diabetes Mother    Pancreatic cancer Father    Hypertension Father    Diabetes Father    Cirrhosis Brother      Current Outpatient Medications:    albuterol (PROVENTIL) (2.5 MG/3ML) 0.083% nebulizer solution, Take 3 mLs (2.5 mg total) by nebulization every 6 (six) hours as needed for wheezing or shortness of breath., Disp: 150 mL, Rfl: 1   aspirin EC 81 MG tablet, Take 1 tablet (81 mg total) by mouth daily. Swallow whole., Disp: 30 tablet, Rfl: 12   atorvastatin (LIPITOR) 80 MG tablet, TAKE 1 TABLET BY MOUTH EVERY NIGHT AT BEDTIME, Disp: 90 tablet, Rfl: 3   benzonatate (TESSALON) 100 MG capsule, Take 1 capsule (100 mg total) by mouth 2 (two) times daily as needed for cough., Disp: 20 capsule, Rfl: 0   clopidogrel (PLAVIX) 75 MG tablet, Take 75 mg by mouth daily., Disp: , Rfl:    co-enzyme Q-10 30 MG capsule, Take 30 mg by mouth daily., Disp: , Rfl:    Cranberry-Vitamin C-Probiotic (AZO CRANBERRY PO), Take 2 tablets by mouth daily at 12 noon., Disp: , Rfl:    diclofenac sodium (VOLTAREN) 1 % GEL, Apply 2 g topically 4 (four) times daily., Disp: 100 g, Rfl: 3   Krill Oil 1000 MG CAPS, Take 1,000 mg by mouth daily. , Disp: , Rfl:    metoprolol tartrate (LOPRESSOR) 25 MG tablet, Take 1 tablet (25 mg total) by mouth 2 (two) times daily. (Patient taking differently: Take 25 mg by mouth daily at 12 noon.), Disp: 60 tablet, Rfl: 2   MULTIPLE VITAMIN PO, Take 1 tablet by mouth daily., Disp: , Rfl:    OneTouch Delica Lancets 70W MISC, TEST FASTING GLUCOSE LEVEL EACH  MORNING BEFORE BREAKFAST, Disp: 100 each, Rfl: 4   pantoprazole (PROTONIX) 40 MG tablet, Take 40 mg by mouth daily., Disp: , Rfl:    potassium chloride SA (KLOR-CON M) 20 MEQ tablet, Take 1 tablet (20 mEq total) by mouth daily., Disp: 14 tablet, Rfl: 0   predniSONE (DELTASONE) 20 MG tablet, Take 1 tablet (20 mg total) by mouth 2 (two) times  daily with a meal., Disp: 10 tablet, Rfl: 0   ramipril (ALTACE) 10 MG capsule, Take 1 capsule (10 mg total) by mouth daily., Disp: 30 capsule, Rfl: 2   tamsulosin (FLOMAX) 0.4 MG CAPS capsule, Take 0.4 mg by mouth daily., Disp: , Rfl:   Physical exam:  Vitals:   03/13/22 0959  BP: (!) 146/80  Pulse: 94  Resp: 16  Temp: (!) 97.2 F (36.2 C)  TempSrc: Tympanic  Weight: 166 lb 3.2 oz (75.4 kg)  Height: 5\' 5"  (1.651 m)   Physical Exam Cardiovascular:     Rate and Rhythm: Normal rate and regular rhythm.     Heart sounds: Normal heart sounds.  Pulmonary:     Effort: Pulmonary effort is normal.     Comments: Mild bilateral wheezing Abdominal:     General: Bowel sounds are normal.     Palpations: Abdomen is soft.  Skin:    General: Skin is warm and dry.  Neurological:     Mental Status: He is alert and oriented to person, place, and time.         Latest Ref Rng & Units 03/13/2022    9:22 AM  CMP  Glucose 70 - 99 mg/dL 164   BUN 8 - 23 mg/dL 24   Creatinine 0.61 - 1.24 mg/dL 1.16   Sodium 135 - 145 mmol/L 137   Potassium 3.5 - 5.1 mmol/L 3.6   Chloride 98 - 111 mmol/L 104   CO2 22 - 32 mmol/L 26   Calcium 8.9 - 10.3 mg/dL 8.5   Total Protein 6.5 - 8.1 g/dL 5.9   Total Bilirubin 0.3 - 1.2 mg/dL 0.9   Alkaline Phos 38 - 126 U/L 55   AST 15 - 41 U/L 24   ALT 0 - 44 U/L 32       Latest Ref Rng & Units 03/13/2022    9:22 AM  CBC  WBC 4.0 - 10.5 K/uL 9.4   Hemoglobin 13.0 - 17.0 g/dL 12.9   Hematocrit 39.0 - 52.0 % 39.0   Platelets 150 - 400 K/uL 118     No images are attached to the encounter.  CT CHEST ABDOMEN PELVIS W  CONTRAST  Result Date: 03/07/2022 CLINICAL DATA:  A 77 year old male presents for follow-up of non-small cell lung cancer. * Tracking Code: BO * EXAM: CT CHEST, ABDOMEN, AND PELVIS WITH CONTRAST TECHNIQUE: Multidetector CT imaging of the chest, abdomen and pelvis was performed following the standard protocol during bolus administration of intravenous contrast. RADIATION DOSE REDUCTION: This exam was performed according to the departmental dose-optimization program which includes automated exposure control, adjustment of the mA and/or kV according to patient size and/or use of iterative reconstruction technique. CONTRAST:  137mL OMNIPAQUE IOHEXOL 300 MG/ML  SOLN COMPARISON:  January 29, 2022. FINDINGS: CT CHEST FINDINGS Cardiovascular: RIGHT-sided Port-A-Cath terminates at the caval to atrial junction. Calcified and noncalcified aortic atherosclerotic plaque without aneurysmal dilation. Central pulmonary vasculature is normal caliber. Extensive 3 vessel coronary artery disease likely associated with changes of percutaneous coronary intervention unchanged from previous imaging. Mediastinum/Nodes: No thoracic inlet or axillary lymphadenopathy. No mediastinal adenopathy. No internal mammary adenopathy. No hilar adenopathy. Lungs/Pleura: RIGHT upper lobe nodule 2.1 x 2.5 cm previously 2.7 x 2.3 cm. Surrounding septal thickening is more confluent and there areas of bronchiectatic change with bandlike changes that extend from the RIGHT hilum into the RIGHT upper chest. Septal thickening at the lung bases similar to previous imaging slightly greater on the RIGHT. Superior  segment consolidation and bronchiectasis related to post treatment change less pronounced than on previous imaging with more profound bronchiectasis and less soft tissue and consolidation. Musculoskeletal: See below for full musculoskeletal details. Small nodular area in the RIGHT axilla measuring 17 mm just deep to the skin surface is unchanged dating  back to prior PET scan where it showed no increased metabolic activity, likely a sebaceous cyst. CT ABDOMEN PELVIS FINDINGS Hepatobiliary: No focal, suspicious hepatic lesion. No pericholecystic stranding. Diminished caliber of the common bile duct since previous imaging. Portal vein is patent. Pancreas: Normal, without mass, inflammation or ductal dilatation. Spleen: Normal. Adrenals/Urinary Tract: Adrenal glands are unremarkable. Symmetric renal enhancement. No sign of hydronephrosis. No suspicious renal lesion or perinephric stranding. Urinary bladder is grossly unremarkable. Large cyst of the RIGHT kidney is unchanged compatible with Bosniak category I cyst. No additional dedicated follow-up imaging is recommended for this finding. Stomach/Bowel: No acute gastrointestinal process. Stool throughout the colon scattered colonic diverticulosis. Normal appendix. Vascular/Lymphatic: Aortic atherosclerosis. No sign of aneurysm. Smooth contour of the IVC. There is no gastrohepatic or hepatoduodenal ligament lymphadenopathy. No retroperitoneal or mesenteric lymphadenopathy. No pelvic sidewall lymphadenopathy. Reproductive: Prostatomegaly.  Signs of prior uro lift procedure. Other: Small fat containing umbilical hernia.  No ascites. Musculoskeletal: No acute bone finding. No destructive bone process. Spinal degenerative changes. IMPRESSION: 1. Continued decrease in size of RIGHT upper lobe nodule with evolving surrounding post treatment changes in the RIGHT chest. 2. No acute findings, no new or progressive findings. 3. No evidence of metastatic disease to the abdomen or pelvis. 4. Prostatomegaly. Signs of prior urolift procedure. 5. Extensive 3 vessel coronary artery disease likely associated with changes of percutaneous coronary intervention unchanged from previous imaging. 6. Small fat containing umbilical hernia. 7. Aortic atherosclerosis. Aortic Atherosclerosis (ICD10-I70.0). Electronically Signed   By: Zetta Bills M.D.   On: 03/07/2022 17:29     Assessment and plan- Patient is a 77 y.o. 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   He is s/p 7 cycles of concurrent chemoradiation ending in October 2023.  He is currently in remission and this is a routine follow-up visit  I have reviewed CT chest abdomen and pelvis images independently and discussed findings with the patient which shows decreased size of right upper lobe lung mass with surrounding radiation changes.  No evidence of mediastinal or hilar adenopathy.  No evidence of progressive or distant metastatic disease.  I will continue to follow up with him with surveillance scans roughly every 3 to 6 months.  I will plan to get  his next scan in 4 months.  Patient has baseline COPD and also follows up with pulmonary.  Patient was noted to have moderate biliary distention on his CT scan in October 2023 butThere is no evidence of obstruction noted on MRI with MRCP.  LFTs are normal and patient remains asymptomatic.  Continue to monitor   Visit Diagnosis 1. Encounter for follow-up surveillance of lung cancer      Dr. Randa Evens, MD, MPH [facility] at [facility] [phone] 03/13/2022 11:06 AM

## 2022-03-15 NOTE — Telephone Encounter (Signed)
Mychart message sent.

## 2022-03-26 DIAGNOSIS — J984 Other disorders of lung: Secondary | ICD-10-CM | POA: Diagnosis not present

## 2022-03-26 DIAGNOSIS — J700 Acute pulmonary manifestations due to radiation: Secondary | ICD-10-CM | POA: Diagnosis not present

## 2022-04-11 NOTE — Progress Notes (Signed)
I,Sulibeya S Dimas,acting as a Neurosurgeon for Shirlee Latch, MD.,have documented all relevant documentation on the behalf of Shirlee Latch, MD,as directed by  Shirlee Latch, MD while in the presence of Shirlee Latch, MD.     Established patient visit   Patient: Luke Rojas   DOB: 07/15/1945   77 y.o. Male  MRN: [id number] Visit Date: 04/12/2022  Today's healthcare provider: Shirlee Latch, MD   Chief Complaint  Patient presents with   Diabetes   Hypertension   Subjective    HPI  Diabetes Mellitus Type II, follow-up  Lab Results  Component Value Date   HGBA1C 5.1 01/08/2022   HGBA1C 5.6 07/17/2021   HGBA1C 7.0 (H) 04/04/2021   Last seen for diabetes 3 months ago.  Management since then includes discontinue metformin due to low A1c.  He reports excellent compliance with treatment. He is not having side effects.   Home blood sugar records: fasting range: 100's  Episodes of hypoglycemia? No    Current insulin regiment: none Most Recent Eye Exam: UTD [facility]  --------------------------------------------------------------------------------------------------- Hypertension, follow-up  BP Readings from Last 3 Encounters:  04/12/22 130/73  03/13/22 134/65  03/13/22 (!) 146/80   Wt Readings from Last 3 Encounters:  04/12/22 172 lb (78 kg)  03/13/22 165 lb 6.4 oz (75 kg)  03/13/22 166 lb 3.2 oz (75.4 kg)     He was last seen for hypertension 3 months ago.  BP at that visit was 107/71. Management since that visit includes continue ramipril 10 mg once daily. He reports excellent compliance with treatment. He is not having side effects.    Outside blood pressures are stable.  ---------------------------------------------------------------------------------------------------   Medications: Outpatient Medications Prior to Visit  Medication Sig   albuterol (PROVENTIL) (2.5 MG/3ML) 0.083% nebulizer solution Take 3 mLs (2.5 mg  total) by nebulization every 6 (six) hours as needed for wheezing or shortness of breath.   aspirin EC 81 MG tablet Take 1 tablet (81 mg total) by mouth daily. Swallow whole.   atorvastatin (LIPITOR) 80 MG tablet TAKE 1 TABLET BY MOUTH EVERY NIGHT AT BEDTIME   clopidogrel (PLAVIX) 75 MG tablet Take 75 mg by mouth daily.   co-enzyme Q-10 30 MG capsule Take 30 mg by mouth daily.   Cranberry-Vitamin C-Probiotic (AZO CRANBERRY PO) Take 2 tablets by mouth daily at 12 noon.   diclofenac sodium (VOLTAREN) 1 % GEL Apply 2 g topically 4 (four) times daily.   Krill Oil 1000 MG CAPS Take 1,000 mg by mouth daily.    metoprolol tartrate (LOPRESSOR) 25 MG tablet Take 1 tablet (25 mg total) by mouth 2 (two) times daily. (Patient taking differently: Take 25 mg by mouth daily at 12 noon.)   MULTIPLE VITAMIN PO Take 1 tablet by mouth daily.   OneTouch Delica Lancets 33G MISC TEST FASTING GLUCOSE LEVEL EACH MORNING BEFORE BREAKFAST   pantoprazole (PROTONIX) 40 MG tablet Take 40 mg by mouth daily.   potassium chloride SA (KLOR-CON M) 20 MEQ tablet Take 1 tablet (20 mEq total) by mouth daily.   ramipril (ALTACE) 10 MG capsule Take 1 capsule (10 mg total) by mouth daily.   tamsulosin (FLOMAX) 0.4 MG CAPS capsule Take 0.4 mg by mouth daily.   [DISCONTINUED] benzonatate (TESSALON) 100 MG capsule Take 1 capsule (100 mg total) by mouth 2 (two) times daily as needed for cough. (Patient not taking: Reported on 04/12/2022)   [DISCONTINUED] predniSONE (DELTASONE) 20 MG tablet Take 1 tablet (20 mg total) by mouth  2 (two) times daily with a meal. (Patient not taking: Reported on 04/12/2022)   No facility-administered medications prior to visit.    Review of Systems  Constitutional:  Negative for appetite change and fatigue.  Eyes:  Negative for visual disturbance.  Respiratory:  Negative for chest tightness and shortness of breath.   Cardiovascular:  Negative for chest pain and leg swelling.  Gastrointestinal:  Negative for  abdominal pain, nausea and vomiting.  Neurological:  Negative for dizziness, light-headedness and headaches.  Hematological:  Bruises/bleeds easily.       Objective    BP 130/73 (BP Location: Left Arm, Patient Position: Sitting, Cuff Size: Large)   Pulse 81   Temp 97.7 F (36.5 C) (Temporal)   Resp 16   Ht 5\' 5"  (1.651 m)   Wt 172 lb (78 kg)   SpO2 97%   BMI 28.62 kg/m    Physical Exam Vitals reviewed.  Constitutional:      General: He is not in acute distress.    Appearance: Normal appearance. He is not diaphoretic.  HENT:     Head: Normocephalic and atraumatic.  Eyes:     General: No scleral icterus.    Conjunctiva/sclera: Conjunctivae normal.  Cardiovascular:     Rate and Rhythm: Normal rate and regular rhythm.     Heart sounds: Normal heart sounds.  Pulmonary:     Effort: Pulmonary effort is normal. No respiratory distress.     Breath sounds: Normal breath sounds. No wheezing or rhonchi.  Musculoskeletal:     Cervical back: Neck supple.     Right lower leg: No edema.     Left lower leg: No edema.  Lymphadenopathy:     Cervical: No cervical adenopathy.  Skin:    General: Skin is warm and dry.     Findings: Bruising present. No rash.  Neurological:     Mental Status: He is alert and oriented to person, place, and time. Mental status is at baseline.  Psychiatric:        Mood and Affect: Mood normal.        Behavior: Behavior normal.     No results found for any visits on 04/12/22.  Assessment & Plan     Problem List Items Addressed This Visit       Cardiovascular and Mediastinum   Hypertension associated with diabetes (HCC)    Well controlled Continue current medications Reviewed recent metabolic panel F/u in 6 months       Senile purpura (HCC)    Discussed previous notes from conversation between [place] and Dr Lawernce Pitts to stop ASA Continue plavix Large hematoma has resolved Upcoming f/u with Dr Wyn Quaker        Endocrine   Diabetes mellitus  [facility]) - Primary    Well controlled with last A1c but slightly low Stopepd metformin at last visit - on no meds now UTD on vaccines, eye exam (ROI sent), foot exam On ACEi/ARB On Statin Discussed diet and exercise F/u in 6 months       Relevant Orders   POCT glycosylated hemoglobin (Hb A1C)   Lipid panel   Hemoglobin A1c   Hyperlipidemia associated with type 2 diabetes mellitus (HCC)    Previously well controlled Continue statin Repeat FLP and CMP Goal LDL < 70       Relevant Orders   Lipid panel   Hemoglobin A1c     Return in about 6 months (around 10/11/2022) for CPE.      I,  Shirlee Latch, MD, have reviewed all documentation for this visit. The documentation on 04/12/22 for the exam, diagnosis, procedures, and orders are all accurate and complete.   Dalylah Ramey, Marzella Schlein, MD, MPH [facility]

## 2022-04-12 ENCOUNTER — Ambulatory Visit (INDEPENDENT_AMBULATORY_CARE_PROVIDER_SITE_OTHER): Payer: Medicare Other | Admitting: Family Medicine

## 2022-04-12 ENCOUNTER — Encounter: Payer: Self-pay | Admitting: Family Medicine

## 2022-04-12 VITALS — BP 130/73 | HR 81 | Temp 97.7°F | Resp 16 | Ht 65.0 in | Wt 172.0 lb

## 2022-04-12 DIAGNOSIS — E1169 Type 2 diabetes mellitus with other specified complication: Secondary | ICD-10-CM

## 2022-04-12 DIAGNOSIS — I152 Hypertension secondary to endocrine disorders: Secondary | ICD-10-CM

## 2022-04-12 DIAGNOSIS — E785 Hyperlipidemia, unspecified: Secondary | ICD-10-CM | POA: Diagnosis not present

## 2022-04-12 DIAGNOSIS — E1159 Type 2 diabetes mellitus with other circulatory complications: Secondary | ICD-10-CM | POA: Diagnosis not present

## 2022-04-12 DIAGNOSIS — D692 Other nonthrombocytopenic purpura: Secondary | ICD-10-CM | POA: Diagnosis not present

## 2022-04-12 NOTE — Assessment & Plan Note (Signed)
Previously well controlled Continue statin Repeat FLP and CMP Goal LDL < 70 

## 2022-04-12 NOTE — Assessment & Plan Note (Signed)
Well controlled Continue current medications Reviewed recent metabolic panel F/u in 6 months  

## 2022-04-12 NOTE — Assessment & Plan Note (Addendum)
Well controlled with last A1c but slightly low Stopepd metformin at last visit - on no meds now UTD on vaccines, eye exam (ROI sent), foot exam On ACEi/ARB On Statin Discussed diet and exercise F/u in 6 months

## 2022-04-12 NOTE — Assessment & Plan Note (Signed)
Discussed previous notes from conversation between [place] and Dr Lunette Stands to stop ASA Continue plavix Large hematoma has resolved Upcoming f/u with Dr Lucky Cowboy

## 2022-04-13 ENCOUNTER — Ambulatory Visit: Payer: Self-pay | Admitting: *Deleted

## 2022-04-13 ENCOUNTER — Telehealth: Payer: Self-pay

## 2022-04-13 ENCOUNTER — Telehealth: Payer: Self-pay | Admitting: Family Medicine

## 2022-04-13 DIAGNOSIS — M5416 Radiculopathy, lumbar region: Secondary | ICD-10-CM | POA: Diagnosis not present

## 2022-04-13 LAB — HEMOGLOBIN A1C
Est. average glucose Bld gHb Est-mCnc: 134 mg/dL
Hgb A1c MFr Bld: 6.3 % — ABNORMAL HIGH (ref 4.8–5.6)

## 2022-04-13 LAB — LIPID PANEL
Chol/HDL Ratio: 3.4 ratio (ref 0.0–5.0)
Cholesterol, Total: 98 mg/dL — ABNORMAL LOW (ref 100–199)
HDL: 29 mg/dL — ABNORMAL LOW (ref >39)
LDL Chol Calc (NIH): 25 mg/dL (ref 0–99)
Triglycerides: 299 mg/dL — ABNORMAL HIGH (ref 0–149)
VLDL Cholesterol Cal: 44 mg/dL — ABNORMAL HIGH (ref 5–40)

## 2022-04-13 MED ORDER — ATORVASTATIN CALCIUM 40 MG PO TABS: 80.0000 mg | ORAL_TABLET | Freq: Every day | ORAL | 2 refills | Status: DC

## 2022-04-13 NOTE — Patient Instructions (Signed)
Visit Information  Thank you for taking time to visit with me today. Please don't hesitate to contact me if I can be of assistance to you before our next scheduled telephone appointment.  Following are the goals we discussed today:  Continue infection prevention strategies to prevent recurrent respiratory infection.   Our next appointment is by telephone on 4/9  Please call the care guide team at [phone] if you need to cancel or reschedule your appointment.   Please call the Suicide and Crisis Lifeline: 988 call the [place] National Suicide Prevention Lifeline: 774-208-5761 or TTY: (704) 266-1230 TTY (317)240-4371) to talk to a trained counselor call 1-800-273-TALK (toll free, 24 hour hotline) call 911 if you are experiencing a Mental Health or [place] or need someone to talk to.  Patient verbalizes understanding of instructions and care plan provided today and agrees to view in [place]. Active MyChart status and patient understanding of how to access instructions and care plan via MyChart confirmed with patient.     The patient has been provided with contact information for the care management team and has been advised to call with any health related questions or concerns.   Valente David, RN, MSN, [place] Care Management Care Management Coordinator [phone]

## 2022-04-13 NOTE — Telephone Encounter (Signed)
Received a fax from covermymeds for atorvastatin calcium 40mg   Key: [id number]

## 2022-04-13 NOTE — Telephone Encounter (Signed)
-----   Message from Virginia Crews, MD sent at 04/13/2022 10:20 AM EST ----- Normal/stable labs. Cholesterol is almost too low.  Would recommend lowering atorvastatin to 40 mg daily (ok to send new Rx if patient agrees)

## 2022-04-13 NOTE — Patient Outreach (Signed)
  Care Coordination   Follow Up Visit Note   04/13/2022 Name: Luke Rojas MRN: [id number] DOB: 1945-10-30  Luke Rojas is a 77 y.o. year old male who sees Bacigalupo, Dionne Bucy, MD for primary care. I spoke with  Luke Rojas by phone today.  What matters to the patients health and wellness today?  Cough/shortness of breath much better.  Today he is having some hip pain, previously had injections in the past.  Will see today if he is in need of more injections.      Goals Addressed             This Visit's Progress    COMPLETED: Decrease fall risk       Care Coordination Interventions: Provided written and verbal education re: potential causes of falls and Fall prevention strategies Reviewed medications and discussed potential side effects of medications such as dizziness and frequent urination Advised patient of importance of notifying provider of falls Assessed patients knowledge of fall risk prevention secondary to previously provided education Advised patient to discuss BP readings with provider Update 2/9 - Patient report more stable, no falls, BP well managed at this time     Management of COPD and lung cancer   On track    Care Coordination Interventions: Provided patient with basic written and verbal COPD education on self care/management/and exacerbation prevention Provided written and verbal instructions on pursed lip breathing and utilized returned demonstration as teach back Provided instruction about proper use of medications used for management of COPD including inhalers Advised patient to self assesses COPD action plan zone and make appointment with provider if in the yellow zone for 48 hours without improvement Discussed use of nebulizers.  Medication was changed after office visit with pulmonology.  Feels he is improving. Repeat chest xrays show pneumonia has improved, state he is feeling much better.  Decreased cough and shortness of  breath Appointment with ortho on today, AWV on 2/12, Oncology on 5/13, vascular on 5/14, and pulmonary follow upon 5/22         SDOH assessments and interventions completed:  No     Care Coordination Interventions:  Yes, provided   Follow up plan: Follow up call scheduled for 4/9    Encounter Outcome:  Pt. Visit Completed   Luke David, RN, MSN, [place] Care Management Care Management Coordinator [phone]

## 2022-04-16 ENCOUNTER — Ambulatory Visit (INDEPENDENT_AMBULATORY_CARE_PROVIDER_SITE_OTHER): Payer: Medicare Other

## 2022-04-16 VITALS — Ht 69.0 in | Wt 172.0 lb

## 2022-04-16 DIAGNOSIS — Z0000 Encounter for general adult medical examination without abnormal findings: Secondary | ICD-10-CM | POA: Diagnosis not present

## 2022-04-16 MED ORDER — ATORVASTATIN CALCIUM 40 MG PO TABS: 40.0000 mg | ORAL_TABLET | Freq: Every day | ORAL | 3 refills | Status: DC

## 2022-04-16 NOTE — Patient Instructions (Signed)
Luke Rojas , Thank you for taking time to come for your Medicare Wellness Visit. I appreciate your ongoing commitment to your health goals. Please review the following plan we discussed and let me know if I can assist you in the future.   These are the goals we discussed:  Goals      Exercise 3x per week (30 min per time)     Recommend to exercise for 3 days a week for at least 30 minutes at a time.      Increase water intake     Recommend to increase water intake to 6-8 8 oz glasses a day.      Management of COPD and lung cancer     Care Coordination Interventions: Provided patient with basic written and verbal COPD education on self care/management/and exacerbation prevention Provided written and verbal instructions on pursed lip breathing and utilized returned demonstration as teach back Provided instruction about proper use of medications used for management of COPD including inhalers Advised patient to self assesses COPD action plan zone and make appointment with provider if in the yellow zone for 48 hours without improvement Discussed use of nebulizers.  Medication was changed after office visit with pulmonology.  Feels he is improving. Repeat chest xrays show pneumonia has improved, state he is feeling much better.  Decreased cough and shortness of breath Appointment with ortho on today, AWV on 2/12, Oncology on 5/13, vascular on 5/14, and pulmonary follow upon 5/22         This is a list of the screening recommended for you and due dates:  Health Maintenance  Topic Date Due   Zoster (Shingles) Vaccine (1 of 2) Never done   DTaP/Tdap/Td vaccine (2 - Td or Tdap) 09/24/2019   COVID-19 Vaccine (4 - 2023-24 season) 11/03/2021   Eye exam for diabetics  01/06/2022   Yearly kidney health urinalysis for diabetes  07/18/2022   Complete foot exam   07/18/2022   Hemoglobin A1C  10/11/2022   Yearly kidney function blood test for diabetes  03/14/2023   Medicare Annual Wellness Visit   04/17/2023   Pneumonia Vaccine  Completed   Flu Shot  Completed   Hepatitis C Screening: USPSTF Recommendation to screen - Ages 18-79 yo.  Completed   HPV Vaccine  Aged Out   Colon Cancer Screening  Discontinued    Advanced directives: no  Conditions/risks identified: low fall risk  Next appointment: Follow up in one year for your annual wellness visit. 04/22/2023@ 1pm Francee Nodal  Preventive Care 33 Years and Older, Male  Preventive care refers to lifestyle choices and visits with your health care provider that can promote health and wellness. What does preventive care include? A yearly physical exam. This is also called an annual well check. Dental exams once or twice a year. Routine eye exams. Ask your health care provider how often you should have your eyes checked. Personal lifestyle choices, including: Daily care of your teeth and gums. Regular physical activity. Eating a healthy diet. Avoiding tobacco and drug use. Limiting alcohol use. Practicing safe sex. Taking low doses of aspirin every day. Taking vitamin and mineral supplements as recommended by your health care provider. What happens during an annual well check? The services and screenings done by your health care provider during your annual well check will depend on your age, overall health, lifestyle risk factors, and family history of disease. Counseling  Your health care provider may ask you questions about your: Alcohol use. Tobacco use.  Drug use. Emotional well-being. Home and relationship well-being. Sexual activity. Eating habits. History of falls. Memory and ability to understand (cognition). Work and work Statistician. Screening  You may have the following tests or measurements: Height, weight, and BMI. Blood pressure. Lipid and cholesterol levels. These may be checked every 5 years, or more frequently if you are over 42 years old. Skin check. Lung cancer screening. You may have this screening  every year starting at age 50 if you have a 30-pack-year history of smoking and currently smoke or have quit within the past 15 years. Fecal occult blood test (FOBT) of the stool. You may have this test every year starting at age 34. Flexible sigmoidoscopy or colonoscopy. You may have a sigmoidoscopy every 5 years or a colonoscopy every 10 years starting at age 79. Prostate cancer screening. Recommendations will vary depending on your family history and other risks. Hepatitis C blood test. Hepatitis B blood test. Sexually transmitted disease (STD) testing. Diabetes screening. This is done by checking your blood sugar (glucose) after you have not eaten for a while (fasting). You may have this done every 1-3 years. Abdominal aortic aneurysm (AAA) screening. You may need this if you are a current or former smoker. Osteoporosis. You may be screened starting at age 71 if you are at high risk. Talk with your health care provider about your test results, treatment options, and if necessary, the need for more tests. Vaccines  Your health care provider may recommend certain vaccines, such as: Influenza vaccine. This is recommended every year. Tetanus, diphtheria, and acellular pertussis (Tdap, Td) vaccine. You may need a Td booster every 10 years. Zoster vaccine. You may need this after age 46. Pneumococcal 13-valent conjugate (PCV13) vaccine. One dose is recommended after age 79. Pneumococcal polysaccharide (PPSV23) vaccine. One dose is recommended after age 29. Talk to your health care provider about which screenings and vaccines you need and how often you need them. This information is not intended to replace advice given to you by your health care provider. Make sure you discuss any questions you have with your health care provider. Document Released: 03/18/2015 Document Revised: 11/09/2015 Document Reviewed: 12/21/2014 Elsevier Interactive Patient Education  2017 [place] Prevention in  the Home Falls can cause injuries. They can happen to people of all ages. There are many things you can do to make your home safe and to help prevent falls. What can I do on the outside of my home? Regularly fix the edges of walkways and driveways and fix any cracks. Remove anything that might make you trip as you walk through a door, such as a raised step or threshold. Trim any bushes or trees on the path to your home. Use bright outdoor lighting. Clear any walking paths of anything that might make someone trip, such as rocks or tools. Regularly check to see if handrails are loose or broken. Make sure that both sides of any steps have handrails. Any raised decks and porches should have guardrails on the edges. Have any leaves, snow, or ice cleared regularly. Use sand or salt on walking paths during winter. Clean up any spills in your garage right away. This includes oil or grease spills. What can I do in the bathroom? Use night lights. Install grab bars by the toilet and in the tub and shower. Do not use towel bars as grab bars. Use non-skid mats or decals in the tub or shower. If you need to sit down in the shower,  use a plastic, non-slip stool. Keep the floor dry. Clean up any water that spills on the floor as soon as it happens. Remove soap buildup in the tub or shower regularly. Attach bath mats securely with double-sided non-slip rug tape. Do not have throw rugs and other things on the floor that can make you trip. What can I do in the bedroom? Use night lights. Make sure that you have a light by your bed that is easy to reach. Do not use any sheets or blankets that are too big for your bed. They should not hang down onto the floor. Have a firm chair that has side arms. You can use this for support while you get dressed. Do not have throw rugs and other things on the floor that can make you trip. What can I do in the kitchen? Clean up any spills right away. Avoid walking on wet  floors. Keep items that you use a lot in easy-to-reach places. If you need to reach something above you, use a strong step stool that has a grab bar. Keep electrical cords out of the way. Do not use floor polish or wax that makes floors slippery. If you must use wax, use non-skid floor wax. Do not have throw rugs and other things on the floor that can make you trip. What can I do with my stairs? Do not leave any items on the stairs. Make sure that there are handrails on both sides of the stairs and use them. Fix handrails that are broken or loose. Make sure that handrails are as long as the stairways. Check any carpeting to make sure that it is firmly attached to the stairs. Fix any carpet that is loose or worn. Avoid having throw rugs at the top or bottom of the stairs. If you do have throw rugs, attach them to the floor with carpet tape. Make sure that you have a light switch at the top of the stairs and the bottom of the stairs. If you do not have them, ask someone to add them for you. What else can I do to help prevent falls? Wear shoes that: Do not have high heels. Have rubber bottoms. Are comfortable and fit you well. Are closed at the toe. Do not wear sandals. If you use a stepladder: Make sure that it is fully opened. Do not climb a closed stepladder. Make sure that both sides of the stepladder are locked into place. Ask someone to hold it for you, if possible. Clearly mark and make sure that you can see: Any grab bars or handrails. First and last steps. Where the edge of each step is. Use tools that help you move around (mobility aids) if they are needed. These include: Canes. Walkers. Scooters. Crutches. Turn on the lights when you go into a dark area. Replace any light bulbs as soon as they burn out. Set up your furniture so you have a clear path. Avoid moving your furniture around. If any of your floors are uneven, fix them. If there are any pets around you, be aware of  where they are. Review your medicines with your doctor. Some medicines can make you feel dizzy. This can increase your chance of falling. Ask your doctor what other things that you can do to help prevent falls. This information is not intended to replace advice given to you by your health care provider. Make sure you discuss any questions you have with your health care provider. Document Released: 12/16/2008 Document  Revised: 07/28/2015 Document Reviewed: 03/26/2014 Elsevier Interactive Patient Education  2017 [employer].

## 2022-04-16 NOTE — Telephone Encounter (Signed)
PA not needed.

## 2022-04-16 NOTE — Progress Notes (Signed)
I connected with  Luke Rojas on 04/16/22 by a audio enabled telemedicine application and verified that I am speaking with the correct person using two identifiers.  Patient Location: Home  Provider Location: Home Office  I discussed the limitations of evaluation and management by telemedicine. The patient expressed understanding and agreed to proceed.  Subjective:   Luke Rojas is a 77 y.o. male who presents for Medicare Annual/Subsequent preventive examination.  Review of Systems    Cardiac Risk Factors include: advanced age (>30men, >8 women);diabetes mellitus;dyslipidemia;male gender;hypertension;Other (see comment), Risk factor comments: chew tobacco    Objective:    Today's Vitals   04/16/22 1409 04/16/22 1410  Weight: 172 lb (78 kg)   Height: 5\' 9"  (1.753 m)   PainSc:  3    Body mass index is 25.4 kg/m.     04/16/2022    2:23 PM 03/13/2022    9:52 AM 01/29/2022   12:39 PM 01/23/2022   10:38 AM 01/08/2022   10:41 AM 12/25/2021    2:10 PM 12/08/2021    9:02 AM  Advanced Directives  Does Patient Have a Medical Advance Directive? No No No No No No No  Would patient like information on creating a medical advance directive? Yes (ED - Information included in AVS) No - Patient declined No - Patient declined No - Patient declined No - Patient declined No - Patient declined     Current Medications (verified) Outpatient Encounter Medications as of 04/16/2022  Medication Sig   albuterol (PROVENTIL) (2.5 MG/3ML) 0.083% nebulizer solution Take 3 mLs (2.5 mg total) by nebulization every 6 (six) hours as needed for wheezing or shortness of breath.   aspirin EC 81 MG tablet Take 1 tablet (81 mg total) by mouth daily. Swallow whole.   atorvastatin (LIPITOR) 40 MG tablet Take 1 tablet (40 mg total) by mouth daily.   clopidogrel (PLAVIX) 75 MG tablet Take 75 mg by mouth daily.   co-enzyme Q-10 30 MG capsule Take 30 mg by mouth daily.   Cranberry-Vitamin C-Probiotic (AZO  CRANBERRY PO) Take 2 tablets by mouth daily at 12 noon.   diclofenac sodium (VOLTAREN) 1 % GEL Apply 2 g topically 4 (four) times daily.   Krill Oil 1000 MG CAPS Take 1,000 mg by mouth daily.    metoprolol tartrate (LOPRESSOR) 25 MG tablet Take 1 tablet (25 mg total) by mouth 2 (two) times daily. (Patient taking differently: Take 25 mg by mouth daily at 12 noon.)   MULTIPLE VITAMIN PO Take 1 tablet by mouth daily.   OneTouch Delica Lancets 65L MISC TEST FASTING GLUCOSE LEVEL EACH MORNING BEFORE BREAKFAST   pantoprazole (PROTONIX) 40 MG tablet Take 40 mg by mouth daily.   potassium chloride SA (KLOR-CON M) 20 MEQ tablet Take 1 tablet (20 mEq total) by mouth daily.   ramipril (ALTACE) 10 MG capsule Take 1 capsule (10 mg total) by mouth daily.   tamsulosin (FLOMAX) 0.4 MG CAPS capsule Take 0.4 mg by mouth daily.   No facility-administered encounter medications on file as of 04/16/2022.    Allergies (verified) Codeine   History: Past Medical History:  Diagnosis Date   Arthritis    OSTEOARTHRITIS   Chronic kidney disease    kidney stones   COPD (chronic obstructive pulmonary disease) (HCC)    Coronary artery disease    2 stents    Diabetes mellitus without complication (HCC)    GERD (gastroesophageal reflux disease)    Hypercholesteremia    Hypertension  Lung cancer [facility])    Myocardial infarction [facility]) 4034   Renal colic    Past Surgical History:  Procedure Laterality Date   CARDIAC CATHETERIZATION  2013   2 stents   CAROTID PTA/STENT INTERVENTION Right 09/11/2021   Procedure: CAROTID PTA/STENT INTERVENTION;  Surgeon: Algernon Huxley, MD;  Location: [place] [facility];  Service: Cardiovascular;  Laterality: Right;   CATARACT EXTRACTION, BILATERAL     COLONOSCOPY  04/27/05   COLONOSCOPY WITH PROPOFOL N/A 06/08/2015   Procedure: COLONOSCOPY WITH PROPOFOL;  Surgeon: Robert Bellow, MD;  Location: [facility] ENDOSCOPY;  Service: Endoscopy;  Laterality: N/A;   CYSTOSCOPY WITH INSERTION OF  UROLIFT N/A 02/11/2020   Procedure: CYSTOSCOPY WITH INSERTION OF UROLIFT;  Surgeon: Royston Cowper, MD;  Location: [facility] ORS;  Service: Urology;  Laterality: N/A;   CYSTOSCOPY WITH STENT PLACEMENT Right 09/16/2016   Procedure: CYSTOSCOPY WITH STENT PLACEMENT;  Surgeon: Royston Cowper, MD;  Location: [facility] ORS;  Service: Urology;  Laterality: Right;   EXTRACORPOREAL SHOCK WAVE LITHOTRIPSY Left 11/04/2014   has had 2 previous lithotripsies   EXTRACORPOREAL SHOCK WAVE LITHOTRIPSY Left 03/24/2015   Procedure: EXTRACORPOREAL SHOCK WAVE LITHOTRIPSY (ESWL);  Surgeon: Royston Cowper, MD;  Location: [facility] ORS;  Service: Urology;  Laterality: Left;   EXTRACORPOREAL SHOCK WAVE LITHOTRIPSY Right 10/04/2016   Procedure: EXTRACORPOREAL SHOCK WAVE LITHOTRIPSY (ESWL);  Surgeon: Royston Cowper, MD;  Location: [facility] OR[facility];  Service: Urology;  Laterality: Right;   LEFT HEART CATH AND CORONARY ANGIOGRAPHY N/A 12/05/2018   Procedure: LEFT HEART CATH AND CORONARY ANGIOGRAPHY with possible pci and stent;  Surgeon: Yolonda Kida, MD;  Location: [employer] [facility];  Service: Cardiovascular;  Laterality: N/A;   PORTA CATH INSERTION Right 09/25/2021   Procedure: PORTA CATH INSERTION;  Surgeon: Algernon Huxley, MD;  Location: [place] [facility];  Service: Cardiovascular;  Laterality: Right;   Family History  Problem Relation Age of Onset   Pulmonary embolism Mother    Transient ischemic attack Mother    Diabetes Mother    Pancreatic cancer Father    Hypertension Father    Diabetes Father    Cirrhosis Brother    Social History   Socioeconomic History   Marital status: Married    Spouse name: Not on file   Number of children: 2   Years of education: Not on file   Highest education level: 8th grade  Occupational History   Occupation: retired  Tobacco Use   Smoking status: Former    Packs/day: 1.00    Years: 30.00    Total pack years: 30.00    Types: Cigarettes    Quit date: 03/05/1984    Years since quitting:  38.1   Smokeless tobacco: Current    Types: Chew   Tobacco comments:    Chews tobacco daily.  Vaping Use   Vaping Use: Never used  Substance and Sexual Activity   Alcohol use: No    Alcohol/week: 0.0 standard drinks of alcohol   Drug use: No   Sexual activity: Not Currently  Other Topics Concern   Not on file  Social History Narrative   Lives with Zigmund Daniel, wife, and son [place].   Social Determinants of Health   Financial Resource Strain: Low Risk  (04/16/2022)   Overall Financial Resource Strain (CARDIA)    Difficulty of Paying Living Expenses: Not hard at all  Food Insecurity: No Food Insecurity (04/16/2022)   Hunger Vital Sign    Worried About Running Out of  Food in the Last Year: Never true    [place] in the Last Year: Never true  Transportation Needs: No Transportation Needs (04/16/2022)   PRAPARE - Hydrologist (Medical): No    Lack of Transportation (Non-Medical): No  Physical Activity: Inactive (04/16/2022)   Exercise Vital Sign    Days of Exercise per Week: 0 days    Minutes of Exercise per Session: 0 min  Stress: No Stress Concern Present (04/16/2022)   [place]    Feeling of Stress : Not at all  Social Connections: Moderately Integrated (04/16/2022)   Social Connection and Isolation Panel [NHANES]    Frequency of Communication with Friends and Family: More than three times a week    Frequency of Social Gatherings with Friends and Family: More than three times a week    Attends Religious Services: More than 4 times per year    Active Member of [employer] or Organizations: No    Attends Archivist Meetings: Never    Marital Status: Married    Tobacco Counseling Ready to quit: Not Answered Counseling given: Not Answered Tobacco comments: Chews tobacco daily.   Clinical Intake:  Pre-visit preparation completed: Yes  Pain : 0-10 Pain Score: 3  Pain Type:  Chronic pain Pain Location: Generalized Pain Descriptors / Indicators: Aching Pain Onset: More than a month ago Pain Frequency: Constant Pain Relieving Factors: balm  Pain Relieving Factors: balm  BMI - recorded: 25.4 Nutritional Status: BMI 25 -29 Overweight Nutritional Risks: None Diabetes: Yes CBG done?: Yes (pt took at home: BS 165 eating sweets) CBG resulted in Enter/ Edit results?: No Did pt. bring in CBG monitor from home?: No (televisit)  How often do you need to have someone help you when you read instructions, pamphlets, or other written materials from your doctor or pharmacy?: 1 - Never  Diabetic?yes  Interpreter Needed?: No  Information entered by :: B.Octivia Canion,LPN   Activities of Daily Living    04/16/2022    2:24 PM 04/12/2022   11:09 AM  In your present state of health, do you have any difficulty performing the following activities:  Hearing? 0 0  Vision? 0 0  Difficulty concentrating or making decisions? 1 1  Walking or climbing stairs? 0 0  Dressing or bathing? 0 0  Doing errands, shopping? 0 0  Preparing Food and eating ? N   Using the Toilet? N   In the past six months, have you accidently leaked urine? N   Do you have problems with loss of bowel control? N   Managing your Medications? N   Managing your Finances? N   Housekeeping or managing your Housekeeping? N     Patient Care Team: Virginia Crews, MD as PCP - General (Family Medicine) Thelma Comp, [place] as Consulting Physician (Optometry) Royston Cowper, MD as Consulting Physician (Urology) Yolonda Kida, MD as Consulting Physician (Cardiology) Telford Nab, RN as Oncology Nurse Navigator Valente David, RN as [place] any recent Medical Services you may have received from other than [facility] providers in the past year (date may be approximate).     Assessment:   This is a routine wellness examination for Luke Rojas.  Hearing/Vision  screen Hearing Screening - Comments:: Adequate hearing Vision Screening - Comments:: Adequate vision.[facility];only readers  Dietary issues and exercise activities discussed: Current Exercise Habits: The patient does not participate in  regular exercise at present, Exercise limited by: None identified   Goals Addressed             This Visit's Progress    Increase water intake   On track    Recommend to increase water intake to 6-8 8 oz glasses a day.        Depression Screen    04/16/2022    2:21 PM 04/12/2022   11:08 AM 01/15/2022    3:04 PM 01/08/2022    2:24 PM 10/26/2021    2:26 PM 07/17/2021    8:11 AM 04/17/2021   11:19 AM  PHQ 2/9 Scores  PHQ - 2 Score 0 0 0 2 0 0 0  PHQ- 9 Score 0 0 4 5 3 3  0    Fall Risk    04/16/2022    2:17 PM 04/12/2022   11:08 AM 10/26/2021    2:27 PM 07/17/2021    8:11 AM 04/17/2021   11:19 AM  Fall Risk   Falls in the past year? 1 0 1 0 0  Number falls in past yr: 0 0 1 0 0  Injury with Fall? 0 0 0 0 0  Risk for fall due to : No Fall Risks  History of fall(s) No Fall Risks No Fall Risks  Follow up Education provided;Falls prevention discussed  Falls evaluation completed;Falls prevention discussed Falls evaluation completed Falls evaluation completed    FALL RISK PREVENTION PERTAINING TO THE HOME:  Any stairs in or around the home? Yes  If so, are there any without handrails? Yes  Home free of loose throw rugs in walkways, pet beds, electrical cords, etc? Yes  Adequate lighting in your home to reduce risk of falls? Yes   ASSISTIVE DEVICES UTILIZED TO PREVENT FALLS:  Life alert? Yes  Use of a cane, walker or w/c? Yes cane and walker..uses only cane as needed Grab bars in the bathroom? Yes  Shower chair or bench in shower? Yes  Elevated toilet seat or a handicapped toilet? Yes    Cognitive Function:        04/16/2022    2:25 PM  6CIT Screen  What Year? 0 points  What month? 0 points  What time? 0 points  Count back  from 20 0 points  Months in reverse 2 points  Repeat phrase 6 points  Total Score 8 points    Immunizations Immunization History  Administered Date(s) Administered   Fluad Quad(high Dose 65+) 12/31/2018, 01/25/2021, 01/08/2022   Influenza Split 03/10/2010, 03/11/2012   Influenza, High Dose Seasonal PF 01/07/2014, 10/31/2015, 10/30/2016, 11/22/2017, 12/05/2019   Influenza,inj,Quad PF,6+ Mos 12/09/2012   Moderna Sars-Covid-2 Vaccination 07/22/2019, 08/22/2019, 02/18/2020   Pneumococcal Conjugate-13 01/07/2014   Pneumococcal Polysaccharide-23 03/31/2015   Tdap 09/23/2009   Zoster, Live 04/02/2011    TDAP status: Up to date  Flu Vaccine status: Up to date  Pneumococcal vaccine status: Up to date  Covid-19 vaccine status: Completed vaccines  Qualifies for Shingles Vaccine? Yes   Zostavax completed No   Shingrix Completed?: No.    Education has been provided regarding the importance of this vaccine. Patient has been advised to call insurance company to determine out of pocket expense if they have not yet received this vaccine. Advised may also receive vaccine at local pharmacy or Health Dept. Verbalized acceptance and understanding.  Screening Tests Health Maintenance  Topic Date Due   Zoster Vaccines- Shingrix (1 of 2) Never done   DTaP/Tdap/Td (2 - Td or Tdap) 09/24/2019  COVID-19 Vaccine (4 - 2023-24 season) 11/03/2021   OPHTHALMOLOGY EXAM  01/06/2022   Diabetic kidney evaluation - Urine ACR  07/18/2022   FOOT EXAM  07/18/2022   HEMOGLOBIN A1C  10/11/2022   Diabetic kidney evaluation - eGFR measurement  03/14/2023   Medicare Annual Wellness (AWV)  04/17/2023   Pneumonia Vaccine 22+ Years old  Completed   INFLUENZA VACCINE  Completed   Hepatitis C Screening  Completed   HPV VACCINES  Aged Out   COLONOSCOPY (Pts 45-22yrs Insurance coverage will need to be confirmed)  Discontinued    Health Maintenance  Health Maintenance Due  Topic Date Due   Zoster Vaccines-  Shingrix (1 of 2) Never done   DTaP/Tdap/Td (2 - Td or Tdap) 09/24/2019   COVID-19 Vaccine (4 - 2023-24 season) 11/03/2021   OPHTHALMOLOGY EXAM  01/06/2022    Colorectal cancer screening: No longer required.   Lung Cancer Screening: (Low Dose CT Chest recommended if Age 38-80 years, 30 pack-year currently smoking OR have quit w/in 15years.) does not qualify.   Lung Cancer Screening Referral: no  Additional Screening:  Hepatitis C Screening: does not qualify; Completed no  Vision Screening: Recommended annual ophthalmology exams for early detection of glaucoma and other disorders of the eye. Is the patient up to date with their annual eye exam?  Yes  Who is the provider or what is the name of the office in which the patient attends annual eye exams? [facility] If pt is not established with a provider, would they like to be referred to a provider to establish care? No .   Dental Screening: Recommended annual dental exams for proper oral hygiene  Community Resource Referral / Chronic Care Management: CRR required this visit?  No   CCM required this visit?  No      Plan:     I have personally reviewed and noted the following in the patient's chart:   Medical and social history Use of alcohol, tobacco or illicit drugs  Current medications and supplements including opioid prescriptions. Patient is not currently taking opioid prescriptions. Functional ability and status Nutritional status Physical activity Advanced directives List of other physicians Hospitalizations, surgeries, and ER visits in previous 12 months Vitals Screenings to include cognitive, depression, and falls Referrals and appointments  In addition, I have reviewed and discussed with patient certain preventive protocols, quality metrics, and best practice recommendations. A written personalized care plan for preventive services as well as general preventive health recommendations were provided to patient.      Roger Shelter, LPN   7/85/8850   Nurse Notes: pt doing alright..has chronic pain in legs uses hemp balm 2-3 times per day.

## 2022-04-17 ENCOUNTER — Encounter: Payer: Self-pay | Admitting: Family Medicine

## 2022-04-17 DIAGNOSIS — M5416 Radiculopathy, lumbar region: Secondary | ICD-10-CM | POA: Diagnosis not present

## 2022-04-20 ENCOUNTER — Ambulatory Visit: Payer: Self-pay

## 2022-04-20 DIAGNOSIS — M5416 Radiculopathy, lumbar region: Secondary | ICD-10-CM | POA: Diagnosis not present

## 2022-04-20 NOTE — Telephone Encounter (Signed)
  Chief Complaint: elevated blood sugar. Symptoms: None Frequency: past 3 day. Pertinent Negatives: Patient denies Any s/s of hypergycemia Disposition: [] ED /[] Urgent Care (no appt availability in office) / [] Appointment(In office/virtual)/ []  [facility]/ [] Home Care/ [] Refused Recommended Disposition /[]  Mobile Bus/ [x]  Follow-up with PCP Additional Notes: Pt has been monitoring his blood sugars, however some were before eating others after. Pt is not keeping a log.  Please advise.    Summary: elevated blood sugar and requesting medication   Pt reports that his blood sugar reading is 209 and he would like to request some medication to help lower it. Pt requests call back to discuss. Cb# [phone]     Reason for Disposition  Blood glucose 70-240 mg/dL (3.9 -13.3 mmol/L)  Answer Assessment - Initial Assessment Questions 1. BLOOD GLUCOSE: "What is your blood glucose level?"      2 days ago - 163, yesterday - 149, today 209 2. ONSET: "When did you check the blood glucose?"     At least one reading was post prandial.  3. USUAL RANGE: "What is your glucose level usually?" (e.g., usual fasting morning value, usual evening value)      4. KETONES: "Do you check for ketones (urine or blood test strips)?" If Yes, ask: "What does the test show now?"       5. TYPE 1 or 2:  "Do you know what type of diabetes you have?"  (e.g., Type 1, Type 2, Gestational; doesn't know)      Type 2 6. INSULIN: "Do you take insulin?" "What type of insulin(s) do you use? What is the mode of delivery? (syringe, pen; injection or pump)?"      no 7. DIABETES PILLS: "Do you take any pills for your diabetes?" If Yes, ask: "Have you missed taking any pills recently?"     Not anymore 8. OTHER SYMPTOMS: "Do you have any symptoms?" (e.g., fever, frequent urination, difficulty breathing, dizziness, weakness, vomiting)     Wheezing - which is usual 9. PREGNANCY: "Is there any chance you are  pregnant?" "When was your last menstrual period?"  Protocols used: Diabetes - High Blood Sugar-A-AH

## 2022-04-20 NOTE — Telephone Encounter (Signed)
Summary: elevated blood sugar and requesting medication   Pt reports that his blood sugar reading is 209 and he would like to request some medication to help lower it. Pt requests call back to discuss. Cb# [phone]        Bonne Dolores [phone] - unable to complete call Tried [phone] - Went to VM - unable to LM no "beep".

## 2022-04-26 ENCOUNTER — Telehealth: Payer: Self-pay

## 2022-04-26 NOTE — Telephone Encounter (Signed)
We stopped patient's metformin due to low A1c. A1c 2 weeks ago was 6.3 still without metformin.  Would only monitor fasting blood sugars (before eating).  Is he on anything that could be raising blood sugar temporarily like prednisone? IF not and wants to resume metformin, that is ok and we can send in new Rx for previous dose and plan to recheck A1c in May.

## 2022-04-26 NOTE — Telephone Encounter (Signed)
-----   Message from Virginia Crews, MD sent at 04/26/2022 10:50 AM EST -----    ----- Message ----- From: Sula Rumple Sent: 04/25/2022   4:29 PM EST To: Virginia Crews, MD

## 2022-04-26 NOTE — Telephone Encounter (Signed)
Patient advised as below. He reports checking his blood sugar every other day. Reports his blood sugar was 119 the last two time he checked. He will continue to monitor fbs. Will call back for metformin if his sugars are elevated.   Virginia Crews, MD  04/26/22 10:52 AM Note We stopped patient's metformin due to low A1c. A1c 2 weeks ago was 6.3 still without metformin.  Would only monitor fasting blood sugars (before eating).  Is he on anything that could be raising blood sugar temporarily like prednisone? IF not and wants to resume metformin, that is ok and we can send in new Rx for previous dose and plan to recheck A1c in May.

## 2022-05-09 DIAGNOSIS — E119 Type 2 diabetes mellitus without complications: Secondary | ICD-10-CM | POA: Diagnosis not present

## 2022-05-09 DIAGNOSIS — M5416 Radiculopathy, lumbar region: Secondary | ICD-10-CM | POA: Diagnosis not present

## 2022-05-25 DIAGNOSIS — M5416 Radiculopathy, lumbar region: Secondary | ICD-10-CM | POA: Diagnosis not present

## 2022-05-25 DIAGNOSIS — M25512 Pain in left shoulder: Secondary | ICD-10-CM | POA: Diagnosis not present

## 2022-06-01 ENCOUNTER — Ambulatory Visit: Payer: Self-pay | Admitting: *Deleted

## 2022-06-01 ENCOUNTER — Encounter: Payer: Self-pay | Admitting: Physician Assistant

## 2022-06-01 ENCOUNTER — Ambulatory Visit (INDEPENDENT_AMBULATORY_CARE_PROVIDER_SITE_OTHER): Payer: Medicare Other | Admitting: Physician Assistant

## 2022-06-01 VITALS — BP 160/80 | HR 74 | Temp 97.5°F | Resp 11 | Ht 68.0 in | Wt 174.6 lb

## 2022-06-01 DIAGNOSIS — C3411 Malignant neoplasm of upper lobe, right bronchus or lung: Secondary | ICD-10-CM | POA: Diagnosis not present

## 2022-06-01 DIAGNOSIS — I152 Hypertension secondary to endocrine disorders: Secondary | ICD-10-CM | POA: Diagnosis not present

## 2022-06-01 DIAGNOSIS — E1159 Type 2 diabetes mellitus with other circulatory complications: Secondary | ICD-10-CM

## 2022-06-01 MED ORDER — RAMIPRIL 5 MG PO CAPS: 5.0000 mg | ORAL_CAPSULE | Freq: Every day | ORAL | 3 refills | Status: DC

## 2022-06-01 NOTE — Progress Notes (Unsigned)
I,[name],acting as a scribe for [employer], PA-C.,have documented all relevant documentation on the behalf of Luke Kirschner, PA-C,as directed by  Luke Kirschner, PA-C while in the presence of Luke Kirschner, PA-C.   Established patient visit   Patient: Luke Rojas   DOB: 1945/03/09   77 y.o. Male  MRN: [id number] Visit Date: 06/01/2022  Today's healthcare provider: [name], PA-C   Chief Complaint  Patient presents with   Hypertension   Subjective    HPI  Hypertension: Patient here for follow-up of elevated blood pressure. He is not exercising and is not adherent to low salt diet.  Blood pressure is not well controlled at home. Cardiac symptoms chest pain, chest pressure/discomfort, dyspnea, and fatigue. Patient denies near-syncope.  Cardiovascular risk factors: advanced age (older than 63 for men, 88 for women) and male gender.  Reports that he has been off of BP medication for 6-8 months, used to take Ramipril 10 mg BID.   Pt reports overall feeling more fatigued and may be wheezing more. Reports 02 is 97% at home consistently.   Medications: Outpatient Medications Prior to Visit  Medication Sig   albuterol (PROVENTIL) (2.5 MG/3ML) 0.083% nebulizer solution Take 3 mLs (2.5 mg total) by nebulization every 6 (six) hours as needed for wheezing or shortness of breath.   atorvastatin (LIPITOR) 40 MG tablet Take 1 tablet (40 mg total) by mouth daily.   clopidogrel (PLAVIX) 75 MG tablet Take 75 mg by mouth daily.   co-enzyme Q-10 30 MG capsule Take 30 mg by mouth daily.   Cranberry-Vitamin C-Probiotic (AZO CRANBERRY PO) Take 2 tablets by mouth daily at 12 noon.   diclofenac sodium (VOLTAREN) 1 % GEL Apply 2 g topically 4 (four) times daily.   Krill Oil 1000 MG CAPS Take 1,000 mg by mouth daily.    metoprolol tartrate (LOPRESSOR) 25 MG tablet Take 1 tablet (25 mg total) by mouth 2 (two) times daily. (Patient taking differently: Take 25 mg by mouth daily at 12  noon.)   MULTIPLE VITAMIN PO Take 1 tablet by mouth daily.   OneTouch Delica Lancets 99991111 MISC TEST FASTING GLUCOSE LEVEL EACH MORNING BEFORE BREAKFAST   pantoprazole (PROTONIX) 40 MG tablet Take 40 mg by mouth daily.   potassium chloride SA (KLOR-CON M) 20 MEQ tablet Take 1 tablet (20 mEq total) by mouth daily.   tamsulosin (FLOMAX) 0.4 MG CAPS capsule Take 0.4 mg by mouth daily.   aspirin EC 81 MG tablet Take 1 tablet (81 mg total) by mouth daily. Swallow whole. (Patient not taking: Reported on 06/01/2022)   [DISCONTINUED] ramipril (ALTACE) 10 MG capsule Take 1 capsule (10 mg total) by mouth daily. (Patient not taking: Reported on 06/01/2022)   No facility-administered medications prior to visit.    Review of Systems  Constitutional:  Positive for fatigue.  Respiratory:  Positive for chest tightness and shortness of breath.   Gastrointestinal:  Negative for nausea.  Neurological:  Positive for dizziness, light-headedness and headaches.     Objective    BP (!) 160/80 (BP Location: Left Arm, Patient Position: Sitting, Cuff Size: Large)   Pulse 74   Temp (!) 97.5 F (36.4 C) (Oral)   Resp 11   Ht 5\' 8"  (1.727 m)   Wt 174 lb 9.6 oz (79.2 kg)   SpO2 97%   BMI 26.55 kg/m   Physical Exam Constitutional:      General: He is awake.     Appearance: He is  well-developed.  HENT:     Head: Normocephalic.  Eyes:     Conjunctiva/sclera: Conjunctivae normal.  Cardiovascular:     Rate and Rhythm: Normal rate and regular rhythm.     Heart sounds: Normal heart sounds.  Pulmonary:     Effort: Pulmonary effort is normal.     Breath sounds: Examination of the right-upper field reveals wheezing and rhonchi. Examination of the right-middle field reveals wheezing and rhonchi. Examination of the right-lower field reveals wheezing and rhonchi. Wheezing and rhonchi present.  Skin:    General: Skin is warm.  Neurological:     Mental Status: He is alert and oriented to person, place, and time.   Psychiatric:        Attention and Perception: Attention normal.        Mood and Affect: Mood normal.        Speech: Speech normal.        Behavior: Behavior is cooperative.      No results found for any visits on 06/01/22.  Assessment & Plan     Problem List Items Addressed This Visit       Cardiovascular and Mediastinum   Hypertension associated with diabetes - Primary    Pt currently off ramipril entirely. Given elevated pressure at home and in office, advised cautious restart with 5 mg daily.  Continue monitoring pressure at home and f/u in 3 weeks with myself or pcp      Relevant Medications   ramipril (ALTACE) 5 MG capsule     Respiratory   Malignant neoplasm of upper lobe of right lung    Residual wheezing/lung damage Pt has nebs at home, advised when he feels increased fatigue/sob/wheezing okay to do treatment per pulm guidelines       Return in about 2 weeks (around 06/15/2022) for hypertension.      I, Luke Kirschner, PA-C have reviewed all documentation for this visit. The documentation on  06/01/22  for the exam, diagnosis, procedures, and orders are all accurate and complete.  Luke Kirschner, PA-C [facility] [address] [place], [place], [address] Office: [phone] Fax: [place]

## 2022-06-01 NOTE — Telephone Encounter (Signed)
Reason for Disposition  Systolic BP  >= 99991111 OR Diastolic >= A999333  Answer Assessment - Initial Assessment Questions 1. BLOOD PRESSURE: "What is the blood pressure?" "Did you take at least two measurements 5 minutes apart?"     186/97, 195/90 P 90 2. ONSET: "When did you take your blood pressure?"     6:25 am, 8:15 am 3. HOW: "How did you take your blood pressure?" (e.g., automatic home BP monitor, visiting nurse)     Auto cuff cuff- arm 4. HISTORY: "Do you have a history of high blood pressure?"     Yes- recently changed medication  5. MEDICINES: "Are you taking any medicines for blood pressure?" "Have you missed any doses recently?"     Patient has been off BP medication - 6-8 months 6. OTHER SYMPTOMS: "Do you have any symptoms?" (e.g., blurred vision, chest pain, difficulty breathing, headache, weakness)     Swimmy headed, weak  Protocols used: Blood Pressure - High-A-AH

## 2022-06-01 NOTE — Telephone Encounter (Signed)
  Chief Complaint: elevated BP Symptoms: elevated BP readings, fatigue, weakness Frequency: patient has been off BP medications for some time- he had not been feeling well and bought a cuff to monitor- he has been getting high readings Pertinent Negatives: Patient denies blurred vision, chest pain, difficulty breathing, headache Disposition: [] ED /[] Urgent Care (no appt availability in office) / [x] Appointment(In office/virtual)/ []  [place] [facility]/ [] Home Care/ [] Refused Recommended Disposition /[] [place] [facility]/ []  Follow-up with PCP Additional Notes: Appointment has been scheduled for BP evaluation

## 2022-06-04 ENCOUNTER — Encounter: Payer: Self-pay | Admitting: Physician Assistant

## 2022-06-04 NOTE — Assessment & Plan Note (Signed)
Residual wheezing/lung damage Pt has nebs at home, advised when he feels increased fatigue/sob/wheezing okay to do treatment per pulm guidelines

## 2022-06-04 NOTE — Assessment & Plan Note (Signed)
Pt currently off ramipril entirely. Given elevated pressure at home and in office, advised cautious restart with 5 mg daily.  Continue monitoring pressure at home and f/u in 3 weeks with myself or pcp

## 2022-06-05 DIAGNOSIS — M25512 Pain in left shoulder: Secondary | ICD-10-CM | POA: Diagnosis not present

## 2022-06-11 ENCOUNTER — Telehealth: Payer: Self-pay | Admitting: Family Medicine

## 2022-06-11 DIAGNOSIS — M75102 Unspecified rotator cuff tear or rupture of left shoulder, not specified as traumatic: Secondary | ICD-10-CM | POA: Diagnosis not present

## 2022-06-11 NOTE — Telephone Encounter (Signed)
[facility] requesting prior authorization Key: [id number] Name: [facility] Atorvastatin Calcium 40mg  tablet

## 2022-06-11 NOTE — Telephone Encounter (Signed)
PA not needed. Pharmacy advised.

## 2022-06-12 ENCOUNTER — Ambulatory Visit: Payer: Self-pay | Admitting: *Deleted

## 2022-06-12 NOTE — Patient Outreach (Signed)
  Care Coordination   Follow Up Visit Note   06/12/2022 Name: [name] MRN: [id number] DOB: Aug 08, 1945  Luke Rojas is a 77 y.o. year old male who sees Bacigalupo, Marzella Schlein, MD for primary care. I spoke with  Dionicio Stall by phone today.  What matters to the patients health and wellness today?  State his blood pressure increased, working to keep it down.  Also state he has shoulder MRI that showed small tear, will start outpatient PT on 5/1.  Denies any urgent concerns, encouraged to contact this care manager with questions.     Goals Addressed             This Visit's Progress    Effective management of HTN       Care Coordination Interventions: Evaluation of current treatment plan related to hypertension self management and patient's adherence to plan as established by provider Provided education to patient re: stroke prevention, s/s of heart attack and stroke Reviewed medications with patient and discussed importance of compliance Discussed plans with patient for ongoing care management follow up and provided patient with direct contact information for care management team Advised patient, providing education and rationale, to monitor blood pressure daily and record, calling PCP for findings outside established parameters Reviewed scheduled/upcoming provider appointments including:  Nurse visit on 4/12 for repeat BP check Reviewed blood pressure readings, yesterday morning it was 173/94 but decreased to 128/73 in the evening after medications. He will monitor and record to review with provider Discussed restarting Ramipril at half dose as instructed by provider, report he has started this with no issues          SDOH assessments and interventions completed:  Yes     Care Coordination Interventions:  Yes, provided   Interventions Today    Flowsheet Row Most Recent Value  Chronic Disease   Chronic disease during today's visit Hypertension (HTN)   General Interventions   General Interventions Discussed/Reviewed General Interventions Reviewed, Doctor Visits  Doctor Visits Discussed/Reviewed Doctor Visits Reviewed, PCP  PCP/Specialist Visits Compliance with follow-up visit  Education Interventions   Education Provided Provided Education  Provided Verbal Education On When to see the doctor, Other  [monitoring blood pressure daily]       Follow up plan: Follow up call scheduled for 5/2    Encounter Outcome:  Pt. Visit Completed   Kemper Durie, RN, MSN, [facility] Care Management Care Management Coordinator [phone]

## 2022-06-12 NOTE — Patient Instructions (Signed)
Visit Information  Thank you for taking time to visit with me today. Please don't hesitate to contact me if I can be of assistance to you before our next scheduled telephone appointment.  Following are the goals we discussed today:  Take antihypertensive as instructed. Monitor blood pressure daily.  Our next appointment is by telephone on 5/2  Please call the care guide team at [phone] if you need to cancel or reschedule your appointment.   Please call the Suicide and Crisis Lifeline: 988 call the [place] National Suicide Prevention Lifeline: 9097253234 or TTY: 726-543-6504 TTY (910)524-2363) to talk to a trained counselor call 1-800-273-TALK (toll free, 24 hour hotline) call 911 if you are experiencing a Mental Health or Behavioral Health Crisis or need someone to talk to.  Patient verbalizes understanding of instructions and care plan provided today and agrees to view in MyChart. Active MyChart status and patient understanding of how to access instructions and care plan via MyChart confirmed with patient.     The patient has been provided with contact information for the care management team and has been advised to call with any health related questions or concerns.   Kemper Durie, RN, MSN, [facility] Care Management Care Management Coordinator [phone]

## 2022-06-13 ENCOUNTER — Ambulatory Visit: Payer: Medicare Other | Admitting: Oncology

## 2022-06-13 ENCOUNTER — Other Ambulatory Visit: Payer: Medicare Other

## 2022-06-14 ENCOUNTER — Encounter: Payer: Self-pay | Admitting: Family Medicine

## 2022-06-14 ENCOUNTER — Ambulatory Visit: Payer: Self-pay | Admitting: *Deleted

## 2022-06-14 ENCOUNTER — Ambulatory Visit (INDEPENDENT_AMBULATORY_CARE_PROVIDER_SITE_OTHER): Payer: Medicare Other | Admitting: Family Medicine

## 2022-06-14 DIAGNOSIS — E1159 Type 2 diabetes mellitus with other circulatory complications: Secondary | ICD-10-CM | POA: Diagnosis not present

## 2022-06-14 DIAGNOSIS — I152 Hypertension secondary to endocrine disorders: Secondary | ICD-10-CM

## 2022-06-14 MED ORDER — RAMIPRIL 2.5 MG PO CAPS: 2.5000 mg | ORAL_CAPSULE | Freq: Two times a day (BID) | ORAL | 3 refills | Status: DC

## 2022-06-14 NOTE — Telephone Encounter (Signed)
  Chief Complaint: Hypertension Symptoms: BP this AM 179/96. Lightheaded, "Swimmy headed"  Frequency: BP trending up past 3 days Pertinent Negatives: Patient denies Disposition: [] ED /[] Urgent Care (no appt availability in office) / [x] Appointment(In office/virtual)/ []  [place] [facility]/ [] Home Care/ [] Refused Recommended Disposition /[] [place] [facility]/ []  Follow-up with PCP Additional Notes: Pt had BP F/U appt tomorrow. Requested to be seen today, appt available and secured for today at 1:40. Care advise provided, pt verbalizes understanding. Reason for Disposition  Systolic BP  >= 180 OR Diastolic >= 110  Answer Assessment - Initial Assessment Questions 1. BLOOD PRESSURE: "What is the blood pressure?" "Did you take at least two measurements 5 minutes apart?"     179/96 2. ONSET: "When did you take your blood pressure?"     This AM 3. HOW: "How did you take your blood pressure?" (e.g., automatic home BP monitor, visiting nurse)     Home monitor 4. HISTORY: "Do you have a history of high blood pressure?"     Yes 5. MEDICINES: "Are you taking any medicines for blood pressure?" "Have you missed any doses recently?"     Yes, no missed doses 6. OTHER SYMPTOMS: "Do you have any symptoms?" (e.g., blurred vision, chest pain, difficulty breathing, headache, weakness)     Lightheaded, off balance last 3 days, no energy  Protocols used: Blood Pressure - High-A-AH

## 2022-06-14 NOTE — Progress Notes (Signed)
I,Sulibeya S Dimas,acting as a Neurosurgeonscribe for Shirlee LatchAngela Ovetta Bazzano, MD.,have documented all relevant documentation on the behalf of Shirlee LatchAngela Latamara Melder, MD,as directed by  Shirlee LatchAngela Anitria Andon, MD while in the presence of Shirlee LatchAngela Duanna Runk, MD.   Established patient visit   Patient: Luke Rojas   DOB: 1945-12-25   77 y.o. Male  MRN: [id number] Visit Date: 06/14/2022  Today's healthcare provider: Shirlee LatchAngela Ilijah Doucet, MD   Chief Complaint  Patient presents with   Hypertension   Subjective    HPI  Patient reports having high blood pressure past week or so. Numbers are 159/85, 479/96, 195/90, and 171/88. Patients reports he feels dizzy, light-headed, and has a headache. He say he just doesn't feel right.   These are all taken first thing in the morning before meds or eating  His BP machine is tested and is comparable to ours today in office  Medications: Outpatient Medications Prior to Visit  Medication Sig   albuterol (PROVENTIL) (2.5 MG/3ML) 0.083% nebulizer solution Take 3 mLs (2.5 mg total) by nebulization every 6 (six) hours as needed for wheezing or shortness of breath.   aspirin EC 81 MG tablet Take 1 tablet (81 mg total) by mouth daily. Swallow whole.   atorvastatin (LIPITOR) 40 MG tablet Take 1 tablet (40 mg total) by mouth daily.   clopidogrel (PLAVIX) 75 MG tablet Take 75 mg by mouth daily.   co-enzyme Q-10 30 MG capsule Take 30 mg by mouth daily.   Cranberry-Vitamin C-Probiotic (AZO CRANBERRY PO) Take 2 tablets by mouth daily at 12 noon.   diclofenac sodium (VOLTAREN) 1 % GEL Apply 2 g topically 4 (four) times daily.   Krill Oil 1000 MG CAPS Take 1,000 mg by mouth daily.    metoprolol tartrate (LOPRESSOR) 25 MG tablet Take 1 tablet (25 mg total) by mouth 2 (two) times daily. (Patient taking differently: Take 25 mg by mouth daily at 12 noon.)   MULTIPLE VITAMIN PO Take 1 tablet by mouth daily.   OneTouch Delica Lancets 33G MISC TEST FASTING GLUCOSE LEVEL EACH MORNING BEFORE  BREAKFAST   pantoprazole (PROTONIX) 40 MG tablet Take 40 mg by mouth daily.   potassium chloride SA (KLOR-CON M) 20 MEQ tablet Take 1 tablet (20 mEq total) by mouth daily.   tamsulosin (FLOMAX) 0.4 MG CAPS capsule Take 0.4 mg by mouth daily.   [DISCONTINUED] ramipril (ALTACE) 5 MG capsule Take 1 capsule (5 mg total) by mouth daily.   No facility-administered medications prior to visit.    Review of Systems per HPI     Objective    BP 128/79 (BP Location: Left Arm, Patient Position: Sitting, Cuff Size: Normal)   Pulse 83   Temp 98.6 F (37 C) (Oral)   Resp 12   Ht 5\' 9"  (1.753 m)   Wt 176 lb 9.6 oz (80.1 kg)   BMI 26.08 kg/m    Physical Exam Vitals reviewed.  Constitutional:      General: He is not in acute distress.    Appearance: Normal appearance. He is not diaphoretic.  HENT:     Head: Normocephalic and atraumatic.  Eyes:     General: No scleral icterus.    Conjunctiva/sclera: Conjunctivae normal.  Cardiovascular:     Rate and Rhythm: Normal rate and regular rhythm.     Heart sounds: Normal heart sounds. No murmur heard. Pulmonary:     Effort: Pulmonary effort is normal. No respiratory distress.     Breath sounds: Wheezing (diffusely) present. No rhonchi.  Musculoskeletal:     Cervical back: Neck supple.     Right lower leg: No edema.     Left lower leg: No edema.  Lymphadenopathy:     Cervical: No cervical adenopathy.  Skin:    General: Skin is warm and dry.     Findings: No rash.  Neurological:     Mental Status: He is alert and oriented to person, place, and time. Mental status is at baseline.  Psychiatric:        Mood and Affect: Mood normal.        Behavior: Behavior normal.      No results found for any visits on 06/14/22.  Assessment & Plan     Problem List Items Addressed This Visit       Cardiovascular and Mediastinum   Hypertension associated with diabetes    Chronic and uncontrolled BP in the office today is well controlled, but home  AM readings are consistently high Will split his ramapril dose to 2.5 mg BID instead of 5mg  daily to see if we can cover AM BP better May consider dose increase at recheck  Upcoming labs with oncology Will be cautious with dose titration with previous hypotension episodes      Relevant Medications   ramipril (ALTACE) 2.5 MG capsule     Return in about 4 weeks (around 07/12/2022) for BP f/u.      I, Shirlee Latch, MD, have reviewed all documentation for this visit. The documentation on 06/14/22 for the exam, diagnosis, procedures, and orders are all accurate and complete.   Anterrio Mccleery, Marzella Schlein, MD, MPH [facility]

## 2022-06-14 NOTE — Assessment & Plan Note (Signed)
Chronic and uncontrolled BP in the office today is well controlled, but home AM readings are consistently high Will split his ramapril dose to 2.5 mg BID instead of 5mg  daily to see if we can cover AM BP better May consider dose increase at recheck  Upcoming labs with oncology Will be cautious with dose titration with previous hypotension episodes

## 2022-06-15 ENCOUNTER — Ambulatory Visit: Payer: Medicare Other | Admitting: Family Medicine

## 2022-07-04 DIAGNOSIS — M25512 Pain in left shoulder: Secondary | ICD-10-CM | POA: Diagnosis not present

## 2022-07-05 ENCOUNTER — Ambulatory Visit: Payer: Self-pay | Admitting: *Deleted

## 2022-07-05 NOTE — Patient Outreach (Signed)
  Care Coordination   Follow Up Visit Note   07/05/2022 Name: BROGAN MARTIS MRN: [id number] DOB: 11/27/1945  Pasty Spillers Kun is a 77 y.o. year old male who sees Bacigalupo, Marzella Schlein, MD for primary care. I spoke with  Dionicio Stall by phone today.  What matters to the patients health and wellness today?  Keep BP controlled.     Goals Addressed             This Visit's Progress    Effective management of HTN   On track    Care Coordination Interventions: Evaluation of current treatment plan related to hypertension self management and patient's adherence to plan as established by provider Provided education to patient re: stroke prevention, s/s of heart attack and stroke Reviewed medications with patient and discussed importance of compliance Discussed plans with patient for ongoing care management follow up and provided patient with direct contact information for care management team Advised patient, providing education and rationale, to monitor blood pressure daily and record, calling PCP for findings outside established parameters Reviewed scheduled/upcoming provider appointments including:            SDOH assessments and interventions completed:  No     Care Coordination Interventions:  Yes, provided   Interventions Today    Flowsheet Row Most Recent Value  Chronic Disease   Chronic disease during today's visit Hypertension (HTN)  General Interventions   General Interventions Discussed/Reviewed Doctor Visits, General Interventions Reviewed, Durable Medical Equipment (DME)  Doctor Visits Discussed/Reviewed Doctor Visits Reviewed, PCP, Specialist  [upcoming appointments:  5/8 for chest CT, 5/13 at cancer center, 5/14 with vascular, and 5/14 at PCP for BP check]  Durable Medical Equipment (DME) BP Cuff  PCP/Specialist Visits Compliance with follow-up visit  Education Interventions   Education Provided Provided Education  Provided Verbal Education On  Other, Medication, When to see the doctor  [Ramipril changed to 2.5 in the morning and 2.5 in the evening.  Blood pressure before meds 160/78, after meds 137/74.  Encouraged to monitor before and after meds, record and share with provider]       Follow up plan: Follow up call scheduled for 6/4    Encounter Outcome:  Pt. Visit Completed   Kemper Durie, RN, MSN, [facility] Care Management Care Management Coordinator [phone]

## 2022-07-11 ENCOUNTER — Ambulatory Visit
Admission: RE | Admit: 2022-07-11 | Discharge: 2022-07-11 | Disposition: A | Discharge status: Home / Self Care | Payer: Medicare Other | Source: Ambulatory Visit | Attending: Oncology | Admitting: Oncology

## 2022-07-11 DIAGNOSIS — R911 Solitary pulmonary nodule: Secondary | ICD-10-CM | POA: Diagnosis not present

## 2022-07-11 DIAGNOSIS — Q6102 Congenital multiple renal cysts: Secondary | ICD-10-CM | POA: Insufficient documentation

## 2022-07-11 DIAGNOSIS — I2510 Atherosclerotic heart disease of native coronary artery without angina pectoris: Secondary | ICD-10-CM | POA: Diagnosis not present

## 2022-07-11 DIAGNOSIS — I700 Atherosclerosis of aorta: Secondary | ICD-10-CM | POA: Diagnosis not present

## 2022-07-11 DIAGNOSIS — Z08 Encounter for follow-up examination after completed treatment for malignant neoplasm: Secondary | ICD-10-CM | POA: Insufficient documentation

## 2022-07-11 DIAGNOSIS — C3411 Malignant neoplasm of upper lobe, right bronchus or lung: Secondary | ICD-10-CM | POA: Diagnosis not present

## 2022-07-11 DIAGNOSIS — Z85118 Personal history of other malignant neoplasm of bronchus and lung: Secondary | ICD-10-CM | POA: Diagnosis not present

## 2022-07-11 DIAGNOSIS — J432 Centrilobular emphysema: Secondary | ICD-10-CM | POA: Insufficient documentation

## 2022-07-11 LAB — POCT I-STAT CREATININE: Creatinine, Ser: 1.3 mg/dL — ABNORMAL HIGH (ref 0.61–1.24)

## 2022-07-11 MED ORDER — IOHEXOL 300 MG/ML  SOLN
85.0000 mL | Freq: Once | INTRAMUSCULAR | Status: AC | PRN
  Administered 2022-07-11: 85 mL via INTRAVENOUS

## 2022-07-13 ENCOUNTER — Other Ambulatory Visit: Payer: Self-pay

## 2022-07-16 ENCOUNTER — Inpatient Hospital Stay (HOSPITAL_BASED_OUTPATIENT_CLINIC_OR_DEPARTMENT_OTHER): Payer: Medicare Other | Admitting: Oncology

## 2022-07-16 ENCOUNTER — Inpatient Hospital Stay: Payer: Medicare Other | Attending: Oncology

## 2022-07-16 ENCOUNTER — Encounter: Payer: Self-pay | Admitting: Oncology

## 2022-07-16 VITALS — BP 112/68 | HR 76 | Temp 97.5°F | Resp 18 | Ht 69.0 in | Wt 177.9 lb

## 2022-07-16 DIAGNOSIS — Z08 Encounter for follow-up examination after completed treatment for malignant neoplasm: Secondary | ICD-10-CM

## 2022-07-16 DIAGNOSIS — Z452 Encounter for adjustment and management of vascular access device: Secondary | ICD-10-CM | POA: Diagnosis not present

## 2022-07-16 DIAGNOSIS — Z85118 Personal history of other malignant neoplasm of bronchus and lung: Secondary | ICD-10-CM | POA: Diagnosis not present

## 2022-07-16 DIAGNOSIS — C3411 Malignant neoplasm of upper lobe, right bronchus or lung: Secondary | ICD-10-CM | POA: Diagnosis not present

## 2022-07-16 LAB — COMPREHENSIVE METABOLIC PANEL
ALT: 19 U/L (ref 0–44)
AST: 22 U/L (ref 15–41)
Albumin: 3.7 g/dL (ref 3.5–5.0)
Alkaline Phosphatase: 69 U/L (ref 38–126)
Anion gap: 9 (ref 5–15)
BUN: 19 mg/dL (ref 8–23)
CO2: 23 mmol/L (ref 22–32)
Calcium: 8.7 mg/dL — ABNORMAL LOW (ref 8.9–10.3)
Chloride: 105 mmol/L (ref 98–111)
Creatinine, Ser: 1.12 mg/dL (ref 0.61–1.24)
GFR, Estimated: >60 mL/min (ref >60)
Glucose, Bld: 118 mg/dL — ABNORMAL HIGH (ref 70–99)
Potassium: 3.5 mmol/L (ref 3.5–5.1)
Sodium: 137 mmol/L (ref 135–145)
Total Bilirubin: 0.4 mg/dL (ref 0.3–1.2)
Total Protein: 6.7 g/dL (ref 6.5–8.1)

## 2022-07-16 LAB — CBC WITH DIFFERENTIAL/PLATELET
Abs Immature Granulocytes: 0.02 10*3/uL (ref 0.00–0.07)
Basophils Absolute: 0 10*3/uL (ref 0.0–0.1)
Basophils Relative: 1 %
Eosinophils Absolute: 0.1 10*3/uL (ref 0.0–0.5)
Eosinophils Relative: 2 %
HCT: 41.5 % (ref 39.0–52.0)
Hemoglobin: 13.8 g/dL (ref 13.0–17.0)
Immature Granulocytes: 0 %
Lymphocytes Relative: 15 %
Lymphs Abs: 0.9 10*3/uL (ref 0.7–4.0)
MCH: 29.4 pg (ref 26.0–34.0)
MCHC: 33.3 g/dL (ref 30.0–36.0)
MCV: 88.5 fL (ref 80.0–100.0)
Monocytes Absolute: 0.8 10*3/uL (ref 0.1–1.0)
Monocytes Relative: 12 %
Neutro Abs: 4.3 10*3/uL (ref 1.7–7.7)
Neutrophils Relative %: 70 %
Platelets: 156 10*3/uL (ref 150–400)
RBC: 4.69 MIL/uL (ref 4.22–5.81)
RDW: 14.8 % (ref 11.5–15.5)
WBC: 6.1 10*3/uL (ref 4.0–10.5)
nRBC: 0 % (ref 0.0–0.2)

## 2022-07-16 MED ORDER — HEPARIN SOD (PORK) LOCK FLUSH 100 UNIT/ML IV SOLN
500.0000 [IU] | Freq: Once | INTRAVENOUS | Status: AC
  Administered 2022-07-16: 500 [IU] via INTRAVENOUS
  Filled 2022-07-16: qty 5

## 2022-07-16 NOTE — Progress Notes (Signed)
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  He quit smoking about 30 years ago but still chews tobacco.  He presented to the ER with symptoms of neck pain which led to an MRI cervical spine as well as CT angio chest.  MRI cervical spine showed degenerative changes along with moderate spinal stenosis and mass effect between C3-C4 and C4-C5.  Foraminal stenosis at C3-C4 C5-C6-C7 nerve levels.  Nonspecific 10 mm T3 vertebral body lesion indeterminate for bone metastases.  CT angio chest showed a 4.5 x 4.9 x 3.7 cm right upper lobe peripheral mass with probable pleural involvement and potential early invasion into right middle lobe.  Borderline enlarged right hilar lymph nodes measuring up to 1.2 cm.   PET CT scan showed hypermetabolic 4.8 cm right upper lobe mass consistent with primary bronchogenic carcinoma and mild asymmetric FDG activity in the right suprahilar region suspicious for right hilar lymph node metastases.  Patient had a CT-guided right upper lobe lung biopsy which was consistent with squamous cell carcinoma.  Interval history-patient is doing well overall.  He denies any  cough or shortness of breath.  No recent hospitalizations.He chews tobacco occasionally  ECOG PS- 1 Pain scale- 0   Review of systems- Review of Systems  Constitutional:  Positive for malaise/fatigue. Negative for chills, fever and weight loss.  HENT:  Negative for congestion, ear discharge and nosebleeds.   Eyes:  Negative for blurred vision.  Respiratory:  Negative for cough, hemoptysis, sputum production, shortness of breath and wheezing.   Cardiovascular:  Negative for chest pain, palpitations, orthopnea and claudication.  Gastrointestinal:  Negative for abdominal pain, blood in stool, constipation, diarrhea, heartburn, melena, nausea and vomiting.  Genitourinary:  Negative for dysuria, flank pain, frequency, hematuria and urgency.  Musculoskeletal:  Negative for back pain, joint pain and myalgias.  Skin:  Negative for rash.  Neurological:  Negative for dizziness, tingling, focal weakness, seizures, weakness and headaches.  Endo/Heme/Allergies:  Does not bruise/bleed easily.  Psychiatric/Behavioral:  Negative for depression and suicidal ideas. The patient does not have insomnia.       Allergies  Allergen Reactions   Codeine Nausea Only, Nausea And Vomiting and Other (See Comments)    Other reaction(s): Vomiting     Past Medical History:  Diagnosis Date   Arthritis    OSTEOARTHRITIS   Chronic kidney disease    kidney stones   COPD (chronic obstructive pulmonary disease) (HCC)    Coronary artery disease    2 stents    Diabetes mellitus without complication (HCC)    GERD (gastroesophageal reflux disease)    Hypercholesteremia    Hypertension    Lung cancer (HCC)    Myocardial infarction [facility]) 2013   Renal colic      Past Surgical  History:  Procedure Laterality Date   CARDIAC CATHETERIZATION  2013   2 stents   CAROTID PTA/STENT INTERVENTION Right 09/11/2021   Procedure: CAROTID PTA/STENT INTERVENTION;  Surgeon: [name], MD;  Location: [facility] INVASIVE CV LAB;   Service: Cardiovascular;  Laterality: Right;   CATARACT EXTRACTION, BILATERAL     COLONOSCOPY  04/27/05   COLONOSCOPY WITH PROPOFOL N/A 06/08/2015   Procedure: COLONOSCOPY WITH PROPOFOL;  Surgeon: [name], MD;  Location: [facility] ENDOSCOPY;  Service: Endoscopy;  Laterality: N/A;   CYSTOSCOPY WITH INSERTION OF UROLIFT N/A 02/11/2020   Procedure: CYSTOSCOPY WITH INSERTION OF UROLIFT;  Surgeon: [name], MD;  Location: [facility] ORS;  Service: Urology;  Laterality: N/A;   CYSTOSCOPY WITH STENT PLACEMENT Right 09/16/2016   Procedure: CYSTOSCOPY WITH STENT PLACEMENT;  Surgeon: [name], MD;  Location: [facility] ORS;  Service: Urology;  Laterality: Right;   EXTRACORPOREAL SHOCK WAVE LITHOTRIPSY Left 11/04/2014   has had 2 previous lithotripsies   EXTRACORPOREAL SHOCK WAVE LITHOTRIPSY Left 03/24/2015   Procedure: EXTRACORPOREAL SHOCK WAVE LITHOTRIPSY (ESWL);  Surgeon: [name], MD;  Location: [facility] ORS;  Service: Urology;  Laterality: Left;   EXTRACORPOREAL SHOCK WAVE LITHOTRIPSY Right 10/04/2016   Procedure: EXTRACORPOREAL SHOCK WAVE LITHOTRIPSY (ESWL);  Surgeon: [name], MD;  Location: [facility] ORS;  Service: Urology;  Laterality: Right;   LEFT HEART CATH AND CORONARY ANGIOGRAPHY N/A 12/05/2018   Procedure: LEFT HEART CATH AND CORONARY ANGIOGRAPHY with possible pci and stent;  Surgeon: [name], MD;  Location: [facility] CV [facility];  Service: Cardiovascular;  Laterality: N/A;   PORTA CATH INSERTION Right 09/25/2021   Procedure: PORTA CATH INSERTION;  Surgeon: [name], MD;  Location: [facility] CV [facility];  Service: Cardiovascular;  Laterality: Right;    Social History   Socioeconomic History   Marital status: Married    Spouse name: Not on file   Number of children: 2   Years of education: Not on file   Highest education level: 8th grade  Occupational History   Occupation: retired  Tobacco Use   Smoking status: Former    Packs/day: 1.00    Years: 30.00     Additional pack years: 0.00    Total pack years: 30.00    Types: Cigarettes    Quit date: 03/05/1984    Years since quitting: 38.3   Smokeless tobacco: Current    Types: Chew   Tobacco comments:    Chews tobacco daily.  Vaping Use   Vaping Use: Never used  Substance and Sexual Activity   Alcohol use: No    Alcohol/week: 0.0 standard drinks of alcohol   Drug use: No   Sexual activity: Not Currently  Other Topics Concern   Not on file  Social History Narrative   Lives with Joyce Gross, wife, and son [place].   Social Determinants of Health   Financial Resource Strain: Low Risk  (04/16/2022)   Overall Financial Resource Strain (CARDIA)    Difficulty of Paying Living Expenses: Not hard at all  Food Insecurity: No Food Insecurity (04/16/2022)   Hunger Vital Sign    Worried About Running Out of Food in the Last Year: Never true    Ran Out of Food in the Last Year: Never true  Transportation Needs: No Transportation Needs (04/16/2022)   PRAPARE - Administrator, Civil Service (Medical): No    Lack of Transportation (Non-Medical): No  Physical Activity: Inactive (04/16/2022)   Exercise Vital Sign  Days of Exercise per Week: 0 days    Minutes of Exercise per Session: 0 min  Stress: No Stress Concern Present (04/16/2022)   [employer] of Occupational Health - Occupational Stress Questionnaire    Feeling of Stress : Not at all  Social Connections: Moderately Integrated (04/16/2022)   Social Connection and Isolation Panel [NHANES]    Frequency of Communication with Friends and Family: More than three times a week    Frequency of Social Gatherings with Friends and Family: More than three times a week    Attends Religious Services: More than 4 times per year    Active Member of [employer] or Organizations: No    Attends Banker Meetings: Never    Marital Status: Married  Catering manager Violence: Not At Risk (04/16/2022)   Humiliation, Afraid, Rape, and Kick questionnaire     Fear of Current or Ex-Partner: No    Emotionally Abused: No    Physically Abused: No    Sexually Abused: No    Family History  Problem Relation Age of Onset   Pulmonary embolism Mother    Transient ischemic attack Mother    Diabetes Mother    Pancreatic cancer Father    Hypertension Father    Diabetes Father    Cirrhosis Brother      Current Outpatient Medications:    albuterol (PROVENTIL) (2.5 MG/3ML) 0.083% nebulizer solution, Take 3 mLs (2.5 mg total) by nebulization every 6 (six) hours as needed for wheezing or shortness of breath., Disp: 150 mL, Rfl: 1   aspirin EC 81 MG tablet, Take 1 tablet (81 mg total) by mouth daily. Swallow whole., Disp: 30 tablet, Rfl: 12   atorvastatin (LIPITOR) 40 MG tablet, Take 1 tablet (40 mg total) by mouth daily., Disp: 90 tablet, Rfl: 3   clopidogrel (PLAVIX) 75 MG tablet, Take 75 mg by mouth daily., Disp: , Rfl:    co-enzyme Q-10 30 MG capsule, Take 30 mg by mouth daily., Disp: , Rfl:    Cranberry-Vitamin C-Probiotic (AZO CRANBERRY PO), Take 2 tablets by mouth daily at 12 noon., Disp: , Rfl:    diclofenac sodium (VOLTAREN) 1 % GEL, Apply 2 g topically 4 (four) times daily., Disp: 100 g, Rfl: 3   ipratropium-albuterol (DUONEB) 0.5-2.5 (3) MG/3ML SOLN, Take by nebulization., Disp: , Rfl:    Krill Oil 1000 MG CAPS, Take 1,000 mg by mouth daily. , Disp: , Rfl:    methylPREDNISolone (MEDROL DOSEPAK) 4 MG TBPK tablet, Take by mouth., Disp: , Rfl:    metoprolol tartrate (LOPRESSOR) 25 MG tablet, Take 1 tablet (25 mg total) by mouth 2 (two) times daily. (Patient taking differently: Take 25 mg by mouth daily at 12 noon.), Disp: 60 tablet, Rfl: 2   MULTIPLE VITAMIN PO, Take 1 tablet by mouth daily., Disp: , Rfl:    nitroGLYCERIN (NITROSTAT) 0.4 MG SL tablet, Place under the tongue., Disp: , Rfl:    OneTouch Delica Lancets 33G MISC, TEST FASTING GLUCOSE LEVEL EACH MORNING BEFORE BREAKFAST, Disp: 100 each, Rfl: 4   pantoprazole (PROTONIX) 40 MG tablet,  Take 40 mg by mouth daily., Disp: , Rfl:    potassium chloride SA (KLOR-CON M) 20 MEQ tablet, Take 1 tablet (20 mEq total) by mouth daily., Disp: 14 tablet, Rfl: 0   ramipril (ALTACE) 2.5 MG capsule, Take 1 capsule (2.5 mg total) by mouth 2 (two) times daily., Disp: 60 capsule, Rfl: 3   tamsulosin (FLOMAX) 0.4 MG CAPS capsule, Take 0.4 mg by mouth  daily., Disp: , Rfl:   Physical exam:  Vitals:   07/16/22 1002  BP: 112/68  Pulse: 76  Resp: 18  Temp: (!) 97.5 F (36.4 C)  TempSrc: Tympanic  SpO2: 97%  Weight: 177 lb 14.4 oz (80.7 kg)  Height: 5\' 9"  (1.753 m)   Physical Exam Cardiovascular:     Rate and Rhythm: Normal rate and regular rhythm.     Heart sounds: Normal heart sounds.  Pulmonary:     Effort: Pulmonary effort is normal.     Breath sounds: Normal breath sounds.     Comments: Subcutaneous firm mass in the right axilla likely lipoma Skin:    General: Skin is warm and dry.  Neurological:     Mental Status: He is alert and oriented to person, place, and time.         Latest Ref Rng & Units 07/11/2022   10:17 AM  CMP  Creatinine 0.61 - 1.24 mg/dL 1.61       Latest Ref Rng & Units 07/16/2022    9:48 AM  CBC  WBC 4.0 - 10.5 K/uL 6.1   Hemoglobin 13.0 - 17.0 g/dL 09.6   Hematocrit 04.5 - 52.0 % 41.5   Platelets 150 - 400 K/uL 156     No images are attached to the encounter.  CT CHEST ABDOMEN PELVIS W CONTRAST  Result Date: 07/13/2022 CLINICAL DATA:  Right upper lobe squamous cell carcinoma diagnosed in 2023. Chemotherapy and radiation therapy. * Tracking Code: BO * EXAM: CT CHEST, ABDOMEN, AND PELVIS WITH CONTRAST TECHNIQUE: Multidetector CT imaging of the chest, abdomen and pelvis was performed following the standard protocol during bolus administration of intravenous contrast. RADIATION DOSE REDUCTION: This exam was performed according to the departmental dose-optimization program which includes automated exposure control, adjustment of the mA and/or kV according  to patient size and/or use of iterative reconstruction technique. CONTRAST:  85mL OMNIPAQUE IOHEXOL 300 MG/ML  SOLN COMPARISON:  03/06/2022 FINDINGS: CT CHEST FINDINGS Cardiovascular: Right Port-A-Cath tip high right atrium. Aortic atherosclerosis. Normal heart size, without pericardial effusion. Left main and 3 vessel coronary artery calcification. No central pulmonary embolism, on this non-dedicated study. Mediastinum/Nodes: No supraclavicular adenopathy. Node within the subcarinal station with extension into the azygoesophageal recess measures 1.4 by 2.0 cm on 30/2 versus 1.6 x 2.3 cm on the prior exam. No hilar adenopathy. Lungs/Pleura: No pleural fluid.  Mild centrilobular emphysema. 2 mm posterior left upper lobe pulmonary nodule on 85/3 is favored to be calcified and represent a granuloma. Similar. There is also a calcified granuloma more superiorly along the left major fissure. Slight increase in radiation induced fibrosis in the central right upper lobe and adjacent superior segment right lower lobe. The right upper lobe nodule measures 2.6 x 2.1 cm on 54/3 versus 2.5 x 2.2 cm on the prior. 1.5 cm craniocaudal on coronal image 98 versus 1.6 cm when remeasured in a similar fashion on the prior. Musculoskeletal: Skin lesion within the right axilla measures 1.8 cm on 11/02 and is similar to on the prior. No acute osseous abnormality. Remote left clavicular trauma. CT ABDOMEN PELVIS FINDINGS Hepatobiliary: Probable mild hepatic steatosis without focal liver lesion or biliary abnormality. Pancreas: Normal, without mass or ductal dilatation. Spleen: 5 mm enhancing anterior splenic lesion on 54/2 is present on the prior and of doubtful clinical significance. No splenomegaly. Adrenals/Urinary Tract: Normal adrenal glands. Left extrarenal pelvis. Left renal sinus cysts. Right renal upper pole 6.9 cm minimally complex cyst . In the absence of  clinically indicated signs/symptoms require(s) no independent follow-up.  Normal urinary bladder. Stomach/Bowel: Normal stomach, without wall thickening. Scattered colonic diverticula. Normal terminal ileum and appendix. Normal small bowel. Vascular/Lymphatic: Advanced aortic and branch vessel atherosclerosis. No abdominopelvic adenopathy. Reproductive: Mild prostatomegaly. UroLift pellets within the prostate. Other: No significant free fluid. No evidence of omental or peritoneal disease. Small fat containing paraumbilical hernia. Musculoskeletal: Lumbosacral spondylosis. IMPRESSION: 1. Similar size of right upper lobe lung nodule with mild progression of surrounding radiation fibrosis. 2. Borderline to mild subcarinal adenopathy, improved. Indeterminate. Favor response to therapy of nodal metastasis. 3. No extrathoracic metastatic disease identified. 4. Nonspecific right axillary skin lesion warrants physical exam correlation. 5. Aortic atherosclerosis (ICD10-I70.0), coronary artery atherosclerosis and emphysema (ICD10-J43.9). Electronically Signed   By: Jeronimo Greaves M.D.   On: 07/13/2022 14:45     Assessment and plan- Patient is a 77 y.o. male  with squamous cell carcinoma of the right upper lobe stage II BC T2N 1M0.   He is s/p 7 cycles of concurrent chemoradiation ending in October 2023.  He is currently in remission and this is a visit to discuss CT scan results and further management   I have viewed CT chest abdomen pelvis images independently and discussed findings with the patient which does not show any evidence of recurrent or progressive disease.  Changes of radiation fibrosis noted in the right upper lobe.  MediastinalAdenopathy continues to regress.  I will get a repeat surveillance scan in 6 months and see him thereafter.  We will keep his port in place for now and get it flushed every 12 weeks.    Visit Diagnosis 1. Encounter for follow-up surveillance of lung cancer      Dr. Owens Shark, MD, MPH [facility] [phone] 07/16/2022 10:08 AM

## 2022-07-16 NOTE — Addendum Note (Signed)
Addended by: Corene Cornea on: 07/16/2022 10:53 AM   Modules accepted: Orders

## 2022-07-17 ENCOUNTER — Encounter: Payer: Self-pay | Admitting: Family Medicine

## 2022-07-17 ENCOUNTER — Ambulatory Visit (INDEPENDENT_AMBULATORY_CARE_PROVIDER_SITE_OTHER): Payer: Medicare Other | Admitting: Vascular Surgery

## 2022-07-17 ENCOUNTER — Ambulatory Visit (INDEPENDENT_AMBULATORY_CARE_PROVIDER_SITE_OTHER): Payer: Medicare Other | Admitting: Family Medicine

## 2022-07-17 ENCOUNTER — Ambulatory Visit (INDEPENDENT_AMBULATORY_CARE_PROVIDER_SITE_OTHER): Payer: Medicare Other

## 2022-07-17 VITALS — BP 111/66 | HR 80 | Temp 98.2°F | Resp 12 | Ht 68.0 in | Wt 178.7 lb

## 2022-07-17 VITALS — BP 132/80 | HR 71 | Resp 16 | Ht 65.75 in | Wt 177.0 lb

## 2022-07-17 DIAGNOSIS — N400 Enlarged prostate without lower urinary tract symptoms: Secondary | ICD-10-CM

## 2022-07-17 DIAGNOSIS — E1159 Type 2 diabetes mellitus with other circulatory complications: Secondary | ICD-10-CM

## 2022-07-17 DIAGNOSIS — I6521 Occlusion and stenosis of right carotid artery: Secondary | ICD-10-CM

## 2022-07-17 DIAGNOSIS — I152 Hypertension secondary to endocrine disorders: Secondary | ICD-10-CM

## 2022-07-17 DIAGNOSIS — C3411 Malignant neoplasm of upper lobe, right bronchus or lung: Secondary | ICD-10-CM

## 2022-07-17 DIAGNOSIS — E1169 Type 2 diabetes mellitus with other specified complication: Secondary | ICD-10-CM | POA: Diagnosis not present

## 2022-07-17 DIAGNOSIS — E785 Hyperlipidemia, unspecified: Secondary | ICD-10-CM

## 2022-07-17 NOTE — Progress Notes (Signed)
I,Sulibeya S Dimas,acting as a Neurosurgeon for Shirlee Latch, MD.,have documented all relevant documentation on the behalf of Shirlee Latch, MD,as directed by  Shirlee Latch, MD while in the presence of Shirlee Latch, MD.     Established patient visit   Patient: Luke Rojas   DOB: Jul 22, 1945   77 y.o. Male  MRN: [id number] Visit Date: 07/17/2022  Today's healthcare provider: Shirlee Latch, MD   Chief Complaint  Patient presents with   Hypertension   Subjective    HPI  Hypertension: Patient here for 4 weeks follow-up of elevated blood pressure. Management since last visit includes split his ramapril dose to 2.5 mg BID instead of 5mg  daily to see if we can cover AM BP better .Blood pressure is well controlled at home. Cardiac symptoms dyspnea. Patient denies chest pain, chest pressure/discomfort, fatigue, and lower extremity edema.    BP Readings from Last 3 Encounters:  07/17/22 111/66  07/17/22 132/80  07/16/22 112/68    Dr Achilles Dunk retired - looking for new urologist  Question about all of his medications and what they are for - printed AVS and labeled each medicaitons  Medications: Outpatient Medications Prior to Visit  Medication Sig   albuterol (PROVENTIL) (2.5 MG/3ML) 0.083% nebulizer solution Take 3 mLs (2.5 mg total) by nebulization every 6 (six) hours as needed for wheezing or shortness of breath.   atorvastatin (LIPITOR) 40 MG tablet Take 1 tablet (40 mg total) by mouth daily.   clopidogrel (PLAVIX) 75 MG tablet Take 75 mg by mouth daily.   co-enzyme Q-10 30 MG capsule Take 30 mg by mouth daily.   Cranberry-Vitamin C-Probiotic (AZO CRANBERRY PO) Take 2 tablets by mouth daily at 12 noon.   diclofenac sodium (VOLTAREN) 1 % GEL Apply 2 g topically 4 (four) times daily.   Krill Oil 1000 MG CAPS Take 1,000 mg by mouth daily.    metoprolol tartrate (LOPRESSOR) 25 MG tablet Take 1 tablet (25 mg total) by mouth 2 (two) times daily. (Patient taking  differently: Take 25 mg by mouth daily at 12 noon.)   MULTIPLE VITAMIN PO Take 1 tablet by mouth daily.   nitroGLYCERIN (NITROSTAT) 0.4 MG SL tablet Place under the tongue.   OneTouch Delica Lancets 33G MISC TEST FASTING GLUCOSE LEVEL EACH MORNING BEFORE BREAKFAST   pantoprazole (PROTONIX) 40 MG tablet Take 40 mg by mouth daily.   ramipril (ALTACE) 2.5 MG capsule Take 1 capsule (2.5 mg total) by mouth 2 (two) times daily.   tamsulosin (FLOMAX) 0.4 MG CAPS capsule Take 0.4 mg by mouth daily.   No facility-administered medications prior to visit.    Review of Systems  Constitutional:  Negative for appetite change and fatigue.  Eyes:  Negative for visual disturbance.  Respiratory:  Negative for chest tightness and shortness of breath.   Gastrointestinal:  Negative for abdominal pain, nausea and vomiting.  Neurological:  Negative for dizziness, light-headedness and headaches.       Objective    BP 111/66 (BP Location: Left Arm, Patient Position: Sitting, Cuff Size: Normal)   Pulse 80   Temp 98.2 F (36.8 C) (Temporal)   Resp 12   Ht 5\' 8"  (1.727 m)   Wt 178 lb 11.2 oz (81.1 kg)   SpO2 99%   BMI 27.17 kg/m  BP Readings from Last 3 Encounters:  07/17/22 111/66  07/17/22 132/80  07/16/22 112/68   Wt Readings from Last 3 Encounters:  07/17/22 178 lb 11.2 oz (81.1 kg)  07/17/22 177 lb (  80.3 kg)  07/16/22 177 lb 14.4 oz (80.7 kg)      Physical Exam Vitals reviewed.  Constitutional:      General: He is not in acute distress.    Appearance: Normal appearance. He is not diaphoretic.  HENT:     Head: Normocephalic and atraumatic.  Eyes:     General: No scleral icterus.    Conjunctiva/sclera: Conjunctivae normal.  Cardiovascular:     Rate and Rhythm: Normal rate and regular rhythm.     Heart sounds: Normal heart sounds. No murmur heard. Pulmonary:     Effort: Pulmonary effort is normal. No respiratory distress.     Breath sounds: Rhonchi present. No wheezing.   Musculoskeletal:     Cervical back: Neck supple.     Right lower leg: No edema.     Left lower leg: No edema.  Lymphadenopathy:     Cervical: No cervical adenopathy.  Skin:    General: Skin is warm and dry.     Findings: No rash.  Neurological:     Mental Status: He is alert and oriented to person, place, and time. Mental status is at baseline.  Psychiatric:        Mood and Affect: Mood normal.        Behavior: Behavior normal.       No results found for any visits on 07/17/22.  Assessment & Plan     Problem List Items Addressed This Visit       Cardiovascular and Mediastinum   Hypertension associated with diabetes (HCC) - Primary    Chronic and well controlled Reviewed last metabolic panel Continue current medications - no changes today F/u in August as scheduled.        Genitourinary   Benign prostatic hyperplasia    Has had multiple procedures with Dr Achilles Dunk who is now retired Looking for a new urologist Referral placed today Continue flomax at current dose      Relevant Orders   Ambulatory referral to Urology     Return in about 3 months (around 10/17/2022) for as scheduled.      'Total time spent on today's visit was greater than 25 minutes, including both face-to-face time and nonface-to-face time personally spent on review of chart (labs and imaging), discussing goals, discussing treatment options, referrals to specialist, answering patient's questions, and coordinating care.   I, Shirlee Latch, MD, have reviewed all documentation for this visit. The documentation on 07/17/22 for the exam, diagnosis, procedures, and orders are all accurate and complete.   Brogen Duell, Marzella Schlein, MD, MPH [facility]

## 2022-07-17 NOTE — Assessment & Plan Note (Signed)
Chronic and well controlled Reviewed last metabolic panel Continue current medications - no changes today F/u in August as scheduled.

## 2022-07-17 NOTE — Assessment & Plan Note (Signed)
Has had multiple procedures with Dr Achilles Dunk who is now retired Looking for a new urologist Referral placed today Continue flomax at current dose

## 2022-07-17 NOTE — Progress Notes (Signed)
MRN : [id number]  Luke Rojas is a 77 y.o. (1945/10/14) male who presents with chief complaint of  Chief Complaint  Patient presents with   Follow-up  .  History of Present Illness: Patient returns in follow-up of his carotid disease.  He underwent a right carotid stent placement for high-grade carotid stenosis last year.  He is doing well from this.  Has had no focal neurologic symptoms since his procedure. Specifically, the patient denies amaurosis fugax, speech or swallowing difficulties, or arm or leg weakness or numbness.  We also placed a Port-A-Cath and he had treatment for his lung cancer and seems to be doing well from this as well.  His carotid duplex today shows no evidence of recurrent carotid stenosis with a widely patent right carotid stent and stable 1 to 39% left ICA stenosis.  Current Outpatient Medications  Medication Sig Dispense Refill   albuterol (PROVENTIL) (2.5 MG/3ML) 0.083% nebulizer solution Take 3 mLs (2.5 mg total) by nebulization every 6 (six) hours as needed for wheezing or shortness of breath. 150 mL 1   atorvastatin (LIPITOR) 40 MG tablet Take 1 tablet (40 mg total) by mouth daily. 90 tablet 3   clopidogrel (PLAVIX) 75 MG tablet Take 75 mg by mouth daily.     co-enzyme Q-10 30 MG capsule Take 30 mg by mouth daily.     Cranberry-Vitamin C-Probiotic (AZO CRANBERRY PO) Take 2 tablets by mouth daily at 12 noon.     diclofenac sodium (VOLTAREN) 1 % GEL Apply 2 g topically 4 (four) times daily. 100 g 3   Krill Oil 1000 MG CAPS Take 1,000 mg by mouth daily.      metoprolol tartrate (LOPRESSOR) 25 MG tablet Take 1 tablet (25 mg total) by mouth 2 (two) times daily. (Patient taking differently: Take 25 mg by mouth daily at 12 noon.) 60 tablet 2   MULTIPLE VITAMIN PO Take 1 tablet by mouth daily.     nitroGLYCERIN (NITROSTAT) 0.4 MG SL tablet Place under the tongue.     OneTouch Delica Lancets 33G MISC TEST FASTING GLUCOSE LEVEL EACH MORNING BEFORE BREAKFAST 100  each 4   pantoprazole (PROTONIX) 40 MG tablet Take 40 mg by mouth daily.     ramipril (ALTACE) 2.5 MG capsule Take 1 capsule (2.5 mg total) by mouth 2 (two) times daily. 60 capsule 3   tamsulosin (FLOMAX) 0.4 MG CAPS capsule Take 0.4 mg by mouth daily.     No current facility-administered medications for this visit.    Past Medical History:  Diagnosis Date   Arthritis    OSTEOARTHRITIS   Chronic kidney disease    kidney stones   COPD (chronic obstructive pulmonary disease) (HCC)    Coronary artery disease    2 stents    Diabetes mellitus without complication (HCC)    GERD (gastroesophageal reflux disease)    Hypercholesteremia    Hypertension    Lung cancer (HCC)    Myocardial infarction [facility]) 2013   Renal colic     Past Surgical History:  Procedure Laterality Date   CARDIAC CATHETERIZATION  2013   2 stents   CAROTID PTA/STENT INTERVENTION Right 09/11/2021   Procedure: CAROTID PTA/STENT INTERVENTION;  Surgeon: [name], MD;  Location: [facility];  Service: Cardiovascular;  Laterality: Right;   CATARACT EXTRACTION, BILATERAL     COLONOSCOPY  04/27/05   COLONOSCOPY WITH PROPOFOL N/A 06/08/2015   Procedure: COLONOSCOPY WITH PROPOFOL;  Surgeon: [name], MD;  Location: [facility];  Service: Endoscopy;  Laterality: N/A;   CYSTOSCOPY WITH INSERTION OF UROLIFT N/A 02/11/2020   Procedure: CYSTOSCOPY WITH INSERTION OF UROLIFT;  Surgeon: [name], MD;  Location: [facility];  Service: Urology;  Laterality: N/A;   CYSTOSCOPY WITH STENT PLACEMENT Right 09/16/2016   Procedure: CYSTOSCOPY WITH STENT PLACEMENT;  Surgeon: [name], MD;  Location: [facility];  Service: Urology;  Laterality: Right;   EXTRACORPOREAL SHOCK WAVE LITHOTRIPSY Left 11/04/2014   has had 2 previous lithotripsies   EXTRACORPOREAL SHOCK WAVE LITHOTRIPSY Left 03/24/2015   Procedure: EXTRACORPOREAL SHOCK WAVE LITHOTRIPSY (ESWL);  Surgeon: [name], MD;  Location: [facility];  Service:  Urology;  Laterality: Left;   EXTRACORPOREAL SHOCK WAVE LITHOTRIPSY Right 10/04/2016   Procedure: EXTRACORPOREAL SHOCK WAVE LITHOTRIPSY (ESWL);  Surgeon: [name], MD;  Location: [facility];  Service: Urology;  Laterality: Right;   LEFT HEART CATH AND CORONARY ANGIOGRAPHY N/A 12/05/2018   Procedure: LEFT HEART CATH AND CORONARY ANGIOGRAPHY with possible pci and stent;  Surgeon: [name], MD;  Location: [facility];  Service: Cardiovascular;  Laterality: N/A;   PORTA CATH INSERTION Right 09/25/2021   Procedure: PORTA CATH INSERTION;  Surgeon: [name], MD;  Location: [facility];  Service: Cardiovascular;  Laterality: Right;     Social History   Tobacco Use   Smoking status: Former    Packs/day: 1.00    Years: 30.00    Additional pack years: 0.00    Total pack years: 30.00    Types: Cigarettes    Quit date: 03/05/1984    Years since quitting: 38.3   Smokeless tobacco: Current    Types: Chew   Tobacco comments:    Chews tobacco daily.  Vaping Use   Vaping Use: Never used  Substance Use Topics   Alcohol use: No    Alcohol/week: 0.0 standard drinks of alcohol   Drug use: No     Family History  Problem Relation Age of Onset   Pulmonary embolism Mother    Transient ischemic attack Mother    Diabetes Mother    Pancreatic cancer Father    Hypertension Father    Diabetes Father    Cirrhosis Brother      Allergies  Allergen Reactions   Codeine Nausea Only, Nausea And Vomiting and Other (See Comments)    Other reaction(s): Vomiting    REVIEW OF SYSTEMS (Negative unless checked)   Constitutional: [] Weight loss  [] Fever  [] Chills Cardiac: [] Chest pain   [] Chest pressure   [] Palpitations   [] Shortness of breath when laying flat   [] Shortness of breath at rest   [x] Shortness of breath with exertion. Vascular:  [] Pain in legs with walking   [] Pain in legs at rest   [] Pain in legs when laying flat   [] Claudication   [] Pain in feet when walking   [] Pain in feet at rest  [] Pain in feet when laying flat   [] History of DVT   [] Phlebitis   [] Swelling in legs   [] Varicose veins   [] Non-healing ulcers Pulmonary:   [] Uses home oxygen   [x] Productive cough   [] Hemoptysis   [] Wheeze  [x] COPD   [] Asthma Neurologic:  [] Dizziness  [] Blackouts   [] Seizures   [] History of stroke   [] History of TIA  [] Aphasia   [] Temporary blindness   [] Dysphagia   [] Weakness or numbness in arms   [] Weakness or numbness in legs Musculoskeletal:  [x] Arthritis   [] Joint swelling   [  x]Joint pain   [] Low back pain Hematologic:  [] Easy bruising  [] Easy bleeding   [] Hypercoagulable state   [] Anemic  [] Hepatitis Gastrointestinal:  [] Blood in stool   [] Vomiting blood  [] Gastroesophageal reflux/heartburn   [] Abdominal pain Genitourinary:  [] Chronic kidney disease   [] Difficult urination  [] Frequent urination  [] Burning with urination   [] Hematuria Skin:  [] Rashes   [] Ulcers   [] Wounds Psychological:  [] History of anxiety   []  History of major depression.   Physical Examination  Vitals:   07/17/22 1040  BP: 132/80  Pulse: 71  Resp: 16  Weight: 177 lb (80.3 kg)  Height: 5' 5.75" (1.67 m)   Body mass index is 28.79 kg/m. Gen:  WD/WN, NAD Head: [place]/AT, No temporalis wasting. Ear/Nose/Throat: Hearing grossly intact, nares w/o erythema or drainage, trachea midline Eyes: Conjunctiva clear. Sclera non-icteric Neck: Supple.  No bruit  Pulmonary:  Good air movement, equal and clear to auscultation bilaterally.  Cardiac: RRR, No JVD Vascular:  Vessel Right Left  Radial Palpable Palpable           Musculoskeletal: M/S 5/5 throughout.  No deformity or atrophy. No edema. Neurologic: CN 2-12 intact. Sensation grossly intact in extremities.  Symmetrical.  Speech is fluent. Motor exam as listed above. Psychiatric: Judgment intact, Mood & affect appropriate for pt's clinical situation. Dermatologic: No rashes or ulcers noted.  No cellulitis or open wounds.    CBC Lab  Results  Component Value Date   WBC 6.1 07/16/2022   HGB 13.8 07/16/2022   HCT 41.5 07/16/2022   MCV 88.5 07/16/2022   PLT 156 07/16/2022    BMET    Component Value Date/Time   NA 137 07/16/2022 0948   NA 139 04/04/2021 0926   NA 139 10/02/2011 1051   K 3.5 07/16/2022 0948   K 4.2 10/02/2011 1051   CL 105 07/16/2022 0948   CL 106 10/02/2011 1051   CO2 23 07/16/2022 0948   CO2 27 10/02/2011 1051   GLUCOSE 118 (H) 07/16/2022 0948   GLUCOSE 97 10/02/2011 1051   BUN 19 07/16/2022 0948   BUN 19 04/04/2021 0926   BUN 21 (H) 10/02/2011 1051   CREATININE 1.12 07/16/2022 0948   CREATININE 1.88 (H) 10/02/2011 1051   CALCIUM 8.7 (L) 07/16/2022 0948   CALCIUM 8.4 (L) 10/02/2011 1051   GFRNONAA >60 07/16/2022 0948   GFRNONAA 36 (L) 10/02/2011 1051   GFRAA 60 12/04/2019 0810   GFRAA 42 (L) 10/02/2011 1051   Estimated Creatinine Clearance: 54.7 mL/min (by C-G formula based on SCr of 1.12 mg/dL).  COAG Lab Results  Component Value Date   INR 1.1 10/15/2021   INR 1.1 09/08/2021    Radiology CT CHEST ABDOMEN PELVIS W CONTRAST  Result Date: 07/13/2022 CLINICAL DATA:  Right upper lobe squamous cell carcinoma diagnosed in 2023. Chemotherapy and radiation therapy. * Tracking Code: BO * EXAM: CT CHEST, ABDOMEN, AND PELVIS WITH CONTRAST TECHNIQUE: Multidetector CT imaging of the chest, abdomen and pelvis was performed following the standard protocol during bolus administration of intravenous contrast. RADIATION DOSE REDUCTION: This exam was performed according to the departmental dose-optimization program which includes automated exposure control, adjustment of the mA and/or kV according to patient size and/or use of iterative reconstruction technique. CONTRAST:  85mL OMNIPAQUE IOHEXOL 300 MG/ML  SOLN COMPARISON:  03/06/2022 FINDINGS: CT CHEST FINDINGS Cardiovascular: Right Port-A-Cath tip high right atrium. Aortic atherosclerosis. Normal heart size, without pericardial effusion. Left main and  3 vessel coronary artery calcification. No central pulmonary embolism,  on this non-dedicated study. Mediastinum/Nodes: No supraclavicular adenopathy. Node within the subcarinal station with extension into the azygoesophageal recess measures 1.4 by 2.0 cm on 30/2 versus 1.6 x 2.3 cm on the prior exam. No hilar adenopathy. Lungs/Pleura: No pleural fluid.  Mild centrilobular emphysema. 2 mm posterior left upper lobe pulmonary nodule on 85/3 is favored to be calcified and represent a granuloma. Similar. There is also a calcified granuloma more superiorly along the left major fissure. Slight increase in radiation induced fibrosis in the central right upper lobe and adjacent superior segment right lower lobe. The right upper lobe nodule measures 2.6 x 2.1 cm on 54/3 versus 2.5 x 2.2 cm on the prior. 1.5 cm craniocaudal on coronal image 98 versus 1.6 cm when remeasured in a similar fashion on the prior. Musculoskeletal: Skin lesion within the right axilla measures 1.8 cm on 11/02 and is similar to on the prior. No acute osseous abnormality. Remote left clavicular trauma. CT ABDOMEN PELVIS FINDINGS Hepatobiliary: Probable mild hepatic steatosis without focal liver lesion or biliary abnormality. Pancreas: Normal, without mass or ductal dilatation. Spleen: 5 mm enhancing anterior splenic lesion on 54/2 is present on the prior and of doubtful clinical significance. No splenomegaly. Adrenals/Urinary Tract: Normal adrenal glands. Left extrarenal pelvis. Left renal sinus cysts. Right renal upper pole 6.9 cm minimally complex cyst . In the absence of clinically indicated signs/symptoms require(s) no independent follow-up. Normal urinary bladder. Stomach/Bowel: Normal stomach, without wall thickening. Scattered colonic diverticula. Normal terminal ileum and appendix. Normal small bowel. Vascular/Lymphatic: Advanced aortic and branch vessel atherosclerosis. No abdominopelvic adenopathy. Reproductive: Mild prostatomegaly. UroLift  pellets within the prostate. Other: No significant free fluid. No evidence of omental or peritoneal disease. Small fat containing paraumbilical hernia. Musculoskeletal: Lumbosacral spondylosis. IMPRESSION: 1. Similar size of right upper lobe lung nodule with mild progression of surrounding radiation fibrosis. 2. Borderline to mild subcarinal adenopathy, improved. Indeterminate. Favor response to therapy of nodal metastasis. 3. No extrathoracic metastatic disease identified. 4. Nonspecific right axillary skin lesion warrants physical exam correlation. 5. Aortic atherosclerosis (ICD10-I70.0), coronary artery atherosclerosis and emphysema (ICD10-J43.9). Electronically Signed   By: Jeronimo Greaves M.D.   On: 07/13/2022 14:45     Assessment/Plan Carotid stenosis, right His carotid duplex today shows no evidence of recurrent carotid stenosis with a widely patent right carotid stent and stable 1 to 39% left ICA stenosis.  Doing well.  Continue current medical regimen.  Recheck in 6 months with carotid duplex.  Hypertension associated with diabetes (HCC) blood pressure control important in reducing the progression of atherosclerotic disease. On appropriate oral medications.     T2DM (type 2 diabetes mellitus) (HCC) blood glucose control important in reducing the progression of atherosclerotic disease. Also, involved in wound healing. On appropriate medications.     Hyperlipidemia associated with type 2 diabetes mellitus (HCC) lipid control important in reducing the progression of atherosclerotic disease. Continue statin therapy   Malignant neoplasm of upper lobe of right lung [facility]) Port in place and has gotten treatments  Festus Barren, MD  07/17/2022 11:19 AM    This note was created with Dragon medical transcription system.  Any errors from dictation are purely unintentional

## 2022-07-17 NOTE — Assessment & Plan Note (Signed)
His carotid duplex today shows no evidence of recurrent carotid stenosis with a widely patent right carotid stent and stable 1 to 39% left ICA stenosis.  Doing well.  Continue current medical regimen.  Recheck in 6 months with carotid duplex.

## 2022-07-25 DIAGNOSIS — J700 Acute pulmonary manifestations due to radiation: Secondary | ICD-10-CM | POA: Diagnosis not present

## 2022-08-07 ENCOUNTER — Ambulatory Visit: Payer: Self-pay | Admitting: *Deleted

## 2022-08-07 NOTE — Patient Outreach (Signed)
  Care Coordination   08/07/2022 Name: Luke Rojas MRN: [id number] DOB: Sep 30, 1945   Care Coordination Outreach Attempts:  An unsuccessful telephone outreach was attempted for a scheduled appointment today.  Follow Up Plan:  Additional outreach attempts will be made to offer the patient care coordination information and services.   Encounter Outcome:  No Answer   Care Coordination Interventions:  No, not indicated    Kemper Durie, RN, MSN, [facility] Care Management Care Management Coordinator [phone]

## 2022-08-22 ENCOUNTER — Telehealth: Payer: Self-pay | Admitting: *Deleted

## 2022-08-22 NOTE — Progress Notes (Signed)
  Care Coordination Note  08/22/2022 Name: ESTEL LINDSKOG MRN: [id number] DOB: [birth year]  Pasty Spillers Critcher is a 77 y.o. year old male who is a primary care patient of Beryle Flock, Marzella Schlein, MD and is actively engaged with the care management team. I reached out to Dionicio Stall by phone today to assist with re-scheduling a follow up visit with the RN Case Manager  Follow up plan: Unsuccessful telephone outreach attempt made. A HIPAA compliant phone message was left for the patient providing contact information and requesting a return call.   Burman Nieves, CCMA Care Coordination Care Guide Direct Dial: [phone]

## 2022-08-27 ENCOUNTER — Ambulatory Visit: Payer: Medicare Other | Admitting: Urology

## 2022-08-27 ENCOUNTER — Ambulatory Visit
Admission: RE | Admit: 2022-08-27 | Discharge: 2022-08-27 | Disposition: A | Discharge status: Home / Self Care | Payer: Medicare Other | Source: Ambulatory Visit | Attending: Urology | Admitting: Urology

## 2022-08-27 ENCOUNTER — Encounter: Payer: Self-pay | Admitting: Urology

## 2022-08-27 VITALS — BP 135/77 | HR 82 | Ht 68.0 in | Wt 175.0 lb

## 2022-08-27 DIAGNOSIS — M4186 Other forms of scoliosis, lumbar region: Secondary | ICD-10-CM | POA: Diagnosis not present

## 2022-08-27 DIAGNOSIS — R8281 Pyuria: Secondary | ICD-10-CM

## 2022-08-27 DIAGNOSIS — N200 Calculus of kidney: Secondary | ICD-10-CM

## 2022-08-27 DIAGNOSIS — N401 Enlarged prostate with lower urinary tract symptoms: Secondary | ICD-10-CM | POA: Diagnosis not present

## 2022-08-27 DIAGNOSIS — Z87442 Personal history of urinary calculi: Secondary | ICD-10-CM | POA: Diagnosis not present

## 2022-08-27 DIAGNOSIS — N400 Enlarged prostate without lower urinary tract symptoms: Secondary | ICD-10-CM

## 2022-08-27 LAB — URINALYSIS, COMPLETE
Bilirubin, UA: NEGATIVE
Glucose, UA: NEGATIVE
Ketones, UA: NEGATIVE
Nitrite, UA: POSITIVE — AB
Protein,UA: NEGATIVE
Specific Gravity, UA: 1.02 (ref 1.005–1.030)
Urobilinogen, Ur: 0.2 mg/dL (ref 0.2–1.0)
pH, UA: 5.5 (ref 5.0–7.5)

## 2022-08-27 LAB — MICROSCOPIC EXAMINATION: WBC, UA: >30 /hpf — AB (ref 0–5)

## 2022-08-27 LAB — BLADDER SCAN AMB NON-IMAGING: Scan Result: 32

## 2022-08-27 NOTE — Progress Notes (Signed)
I, Luke Rojas,acting as a scribe for Luke Altes, MD.,have documented all relevant documentation on the behalf of Luke Altes, MD,as directed by  Luke Altes, MD while in the presence of Luke Altes, MD.  08/27/2022 9:56 AM   Luke Rojas [birth year] [id number]  Referring provider: [name], MD [address] [place],  [place] [address]  Chief Complaint  Patient presents with   Benign Prostatic Hypertrophy    HPI: Luke Rojas is a 77 y.o. male referred for evaluation of the BPH.  Presents for transfer of urologic care. Previous patient of Dr. Evelene Rojas followed for BPH and history of stone disease. States he's had approximately 15 stones in his lifetime. He had a stent placed in 2018 for a right ureteral calculus subsequently treated with SWL. UroLift December 2021 for BPH.  Presently has no bothersome lower urinary tract symptoms. He gets up once per night to void IPSS today 1/35.  Continues on Tamsulosin.  Denies dysuria and gross hematuria. No flank abdominal or pelvic pain.   PMH: Past Medical History:  Diagnosis Date   Arthritis    OSTEOARTHRITIS   Chronic kidney disease    kidney stones   COPD (chronic obstructive pulmonary disease) (HCC)    Coronary artery disease    2 stents    Diabetes mellitus without complication (HCC)    GERD (gastroesophageal reflux disease)    Hypercholesteremia    Hypertension    Lung cancer (HCC)    Myocardial infarction [facility]) 2013   Renal colic     Surgical History: Past Surgical History:  Procedure Laterality Date   CARDIAC CATHETERIZATION  2013   2 stents   CAROTID PTA/STENT INTERVENTION Right 09/11/2021   Procedure: CAROTID PTA/STENT INTERVENTION;  Surgeon: Luke Needy, MD;  Location: [facility];  Service: Cardiovascular;  Laterality: Right;   CATARACT EXTRACTION, BILATERAL     COLONOSCOPY  04/27/05   COLONOSCOPY WITH PROPOFOL N/A 06/08/2015   Procedure:  COLONOSCOPY WITH PROPOFOL;  Surgeon: Luke Mayotte, MD;  Location: [facility];  Service: Endoscopy;  Laterality: N/A;   CYSTOSCOPY WITH INSERTION OF UROLIFT N/A 02/11/2020   Procedure: CYSTOSCOPY WITH INSERTION OF UROLIFT;  Surgeon: Luke Ape, MD;  Location: [facility];  Service: Urology;  Laterality: N/A;   CYSTOSCOPY WITH STENT PLACEMENT Right 09/16/2016   Procedure: CYSTOSCOPY WITH STENT PLACEMENT;  Surgeon: Luke Ape, MD;  Location: [facility];  Service: Urology;  Laterality: Right;   EXTRACORPOREAL SHOCK WAVE LITHOTRIPSY Left 11/04/2014   has had 2 previous lithotripsies   EXTRACORPOREAL SHOCK WAVE LITHOTRIPSY Left 03/24/2015   Procedure: EXTRACORPOREAL SHOCK WAVE LITHOTRIPSY (ESWL);  Surgeon: Luke Ape, MD;  Location: [facility];  Service: Urology;  Laterality: Left;   EXTRACORPOREAL SHOCK WAVE LITHOTRIPSY Right 10/04/2016   Procedure: EXTRACORPOREAL SHOCK WAVE LITHOTRIPSY (ESWL);  Surgeon: Luke Ape, MD;  Location: [facility];  Service: Urology;  Laterality: Right;   LEFT HEART CATH AND CORONARY ANGIOGRAPHY N/A 12/05/2018   Procedure: LEFT HEART CATH AND CORONARY ANGIOGRAPHY with possible pci and stent;  Surgeon: Luke Pea, MD;  Location: [facility];  Service: Cardiovascular;  Laterality: N/A;   PORTA CATH INSERTION Right 09/25/2021   Procedure: PORTA CATH INSERTION;  Surgeon: Luke Needy, MD;  Location: [facility];  Service: Cardiovascular;  Laterality: Right;    Home Medications:  Allergies as of 08/27/2022       Reactions  Codeine Nausea Only, Nausea And Vomiting, Other (See Comments)   Other reaction(s): Vomiting        Medication List        Accurate as of August 27, 2022  9:56 AM. If you have any questions, ask your nurse or doctor.          STOP taking these medications    Krill Oil 1000 MG Caps Stopped by: Luke Altes, MD       TAKE these medications    albuterol (2.5 MG/3ML) 0.083% nebulizer  solution Commonly known as: PROVENTIL Take 3 mLs (2.5 mg total) by nebulization every 6 (six) hours as needed for wheezing or shortness of breath.   atorvastatin 40 MG tablet Commonly known as: LIPITOR Take 1 tablet (40 mg total) by mouth daily.   AZO CRANBERRY PO Take 2 tablets by mouth daily at 12 noon.   clopidogrel 75 MG tablet Commonly known as: PLAVIX Take 75 mg by mouth daily.   co-enzyme Q-10 30 MG capsule Take 30 mg by mouth daily.   diclofenac sodium 1 % Gel Commonly known as: VOLTAREN Apply 2 g topically 4 (four) times daily.   finasteride 5 MG tablet Commonly known as: PROSCAR Take by mouth.   Fish Oil 1000 MG Caps Take by mouth.   metFORMIN 750 MG 24 hr tablet Commonly known as: GLUCOPHAGE-XR   metoprolol tartrate 25 MG tablet Commonly known as: LOPRESSOR Take 1 tablet (25 mg total) by mouth 2 (two) times daily. What changed: when to take this   MULTIPLE VITAMIN PO Take 1 tablet by mouth daily.   nitroGLYCERIN 0.4 MG SL tablet Commonly known as: NITROSTAT Place under the tongue.   OneTouch Delica Lancets 33G Misc TEST FASTING GLUCOSE LEVEL EACH MORNING BEFORE BREAKFAST   pantoprazole 40 MG tablet Commonly known as: PROTONIX Take 40 mg by mouth daily.   ramipril 2.5 MG capsule Commonly known as: ALTACE Take 1 capsule (2.5 mg total) by mouth 2 (two) times daily.   tamsulosin 0.4 MG Caps capsule Commonly known as: FLOMAX Take 0.4 mg by mouth daily.        Allergies:  Allergies  Allergen Reactions   Codeine Nausea Only, Nausea And Vomiting and Other (See Comments)    Other reaction(s): Vomiting    Family History: Family History  Problem Relation Age of Onset   Pulmonary embolism Mother    Transient ischemic attack Mother    Diabetes Mother    Pancreatic cancer Father    Hypertension Father    Diabetes Father    Cirrhosis Brother     Social History:  reports that he quit smoking about 38 years ago. His smoking use included  cigarettes. He has a 30.00 pack-year smoking history. His smokeless tobacco use includes chew. He reports that he does not drink alcohol and does not use drugs.   Physical Exam: BP 135/77   Pulse 82   Ht 5\' 8"  (1.727 m)   Wt 175 lb (79.4 kg)   BMI 26.61 kg/m   Constitutional:  Alert and oriented, No acute distress. HEENT: [place] AT Respiratory: Normal respiratory effort, no increased work of breathing. Psychiatric: Normal mood and affect.   Urinalysis Trace blood/nitrate positive/2+ leukocytes, microscopy >30 WBC/3-10 RBC   Assessment & Plan:    1. BPH with LUTS  Status post UroLift with mild LUTS  Remains on tamsulosin  PVR today 32 mL   2. Personal history urinary calculi  KUB ordered today  3. Pyuria  We discuss etiologies including asymptomatic bacteria and possibility of recurrent stone disease  KUB ordered as above  I have reviewed the above documentation for accuracy and completeness, and I agree with the above.   Luke Altes, MD  [facility] [address] [place], [place] [address] [phone]

## 2022-09-03 LAB — CULTURE, URINE COMPREHENSIVE

## 2022-09-07 NOTE — Progress Notes (Signed)
  Care Coordination Note  09/07/2022 Name: Luke Rojas MRN: [id number] DOB: [birth year]  Luke Rojas is a 77 y.o. year old male who is a primary care patient of Beryle Flock, Marzella Schlein, MD and is actively engaged with the care management team. I reached out to Dionicio Stall by phone today to assist with re-scheduling a follow up visit with the RN Case Manager  Follow up plan: We have been unable to make contact with the patient for follow up.   Luke Rojas, CCMA Care Coordination Care Guide Direct Dial: [phone]

## 2022-09-12 DIAGNOSIS — R0602 Shortness of breath: Secondary | ICD-10-CM | POA: Diagnosis not present

## 2022-09-12 DIAGNOSIS — R079 Chest pain, unspecified: Secondary | ICD-10-CM | POA: Diagnosis not present

## 2022-09-12 DIAGNOSIS — R519 Headache, unspecified: Secondary | ICD-10-CM | POA: Diagnosis not present

## 2022-09-12 DIAGNOSIS — R0789 Other chest pain: Secondary | ICD-10-CM | POA: Diagnosis not present

## 2022-09-20 ENCOUNTER — Ambulatory Visit: Payer: Self-pay

## 2022-09-20 ENCOUNTER — Encounter: Payer: Self-pay | Admitting: Physician Assistant

## 2022-09-20 ENCOUNTER — Ambulatory Visit: Payer: Medicare Other | Admitting: Physician Assistant

## 2022-09-20 VITALS — BP 127/68 | HR 81 | Temp 98.1°F | Ht 68.0 in | Wt 179.7 lb

## 2022-09-20 DIAGNOSIS — J329 Chronic sinusitis, unspecified: Secondary | ICD-10-CM | POA: Diagnosis not present

## 2022-09-20 MED ORDER — CETIRIZINE HCL 10 MG PO TABS
ORAL_TABLET | ORAL | 11 refills | Status: DC
Start: 2022-09-20 — End: 2023-10-07

## 2022-09-20 NOTE — Telephone Encounter (Signed)
  Chief Complaint: headache, cough Symptoms: sinus congestion  Frequency: 4 weeks Pertinent Negatives: Patient denies SOB, fever Disposition: [] ED /[] Urgent Care (no appt availability in office) / [x] Appointment(In office/virtual)/ []  [place] [facility]/ [] Home Care/ [] Refused Recommended Disposition /[] [place] [facility]/ []  Follow-up with PCP Additional Notes: no appts with PCP appt made with another provider.  Reason for Disposition  [1] Sinus congestion (pressure, fullness) AND [2] present > 10 days  Answer Assessment - Initial Assessment Questions 1. LOCATION: "Where does it hurt?"      headache 2. ONSET: "When did the sinus pain start?"  (e.g., hours, days)      4 weeks  3. SEVERITY: "How bad is the pain?"   (Scale 1-10; mild, moderate or severe)   - MILD (1-3): doesn't interfere with normal activities    - MODERATE (4-7): interferes with normal activities (e.g., work or school) or awakens from sleep   - SEVERE (8-10): excruciating pain and patient unable to do any normal activities        6 5. NASAL CONGESTION: "Is the nose blocked?" If Yes, ask: "Can you open it or must you breathe through your mouth?"     no 6. NASAL DISCHARGE: "Do you have discharge from your nose?" If so ask, "What color?"     Yes- white  7. FEVER: "Do you have a fever?" If Yes, ask: "What is it, how was it measured, and when did it start?"      no 8. OTHER SYMPTOMS: "Do you have any other symptoms?" (e.g., sore throat, cough, earache, difficulty breathing)     Cough with  whitephlegm. Head congestion  Protocols used: Sinus Pain or Congestion-A-AH

## 2022-09-20 NOTE — Progress Notes (Unsigned)
Established patient visit  Patient: Luke Rojas   DOB: 05-14-1945   77 y.o. Male  MRN: [id number] Visit Date: 09/20/2022  Today's healthcare provider: Debera Lat, PA-C   Chief Complaint  Patient presents with   Acute Visit    Sinus issues , started 1 month ago, , last week he was given a zpac and medrol dose pac , hasn't help.coughing  runny nose , with mucus ,  using a nasal spray was using cough meds didn't help , no fever    Subjective     Discussed the use of AI scribe software for clinical note transcription with the patient, who gave verbal consent to proceed.  History of Present Illness   The patient, with a history of lung cancer treated with radiation and chemotherapy, presents with a persistent cough, runny nose, and mucus production for about a month. Despite treatment with a Z-Pak and Medrol prednisone, the symptoms have not improved. The cough is described as productive, with the patient coughing up a 'white mess.' The patient also reports frequent nose blowing throughout the day. The symptoms have been severe enough to disrupt daily activities, including attendance at church. The patient also uses a nebulizer as needed for symptom relief. The patient denies any sore throat but has been using Chloraseptic spray to prevent it. The patient also reports fatigue, stating he feels 'tired before I get there' when attempting to do activities.           07/17/2022    1:05 PM 06/14/2022    1:51 PM 06/01/2022   11:18 AM  Depression screen PHQ 2/9  Decreased Interest 2 2 1   Down, Depressed, Hopeless 0 0 1  PHQ - 2 Score 2 2 2   Altered sleeping 0 0 0  Tired, decreased energy 3 2 1   Change in appetite 0 0 0  Feeling bad or failure about yourself  0 0 0  Trouble concentrating 0 0 0  Moving slowly or fidgety/restless 0 0 0  Suicidal thoughts 0 0 0  PHQ-9 Score 5 4 3   Difficult doing work/chores Not difficult at all Not difficult at all Not difficult at all      Medications: Outpatient Medications Prior to Visit  Medication Sig   albuterol (PROVENTIL) (2.5 MG/3ML) 0.083% nebulizer solution Take 3 mLs (2.5 mg total) by nebulization every 6 (six) hours as needed for wheezing or shortness of breath.   atorvastatin (LIPITOR) 40 MG tablet Take 1 tablet (40 mg total) by mouth daily.   clopidogrel (PLAVIX) 75 MG tablet Take 75 mg by mouth daily.   co-enzyme Q-10 30 MG capsule Take 30 mg by mouth daily.   Cranberry-Vitamin C-Probiotic (AZO CRANBERRY PO) Take 2 tablets by mouth daily at 12 noon.   diclofenac sodium (VOLTAREN) 1 % GEL Apply 2 g topically 4 (four) times daily.   finasteride (PROSCAR) 5 MG tablet Take by mouth.   metFORMIN (GLUCOPHAGE-XR) 750 MG 24 hr tablet    metoprolol tartrate (LOPRESSOR) 25 MG tablet Take 1 tablet (25 mg total) by mouth 2 (two) times daily. (Patient taking differently: Take 25 mg by mouth daily at 12 noon.)   MULTIPLE VITAMIN PO Take 1 tablet by mouth daily.   nitroGLYCERIN (NITROSTAT) 0.4 MG SL tablet Place under the tongue.   Omega-3 Fatty Acids (FISH OIL) 1000 MG CAPS Take by mouth.   OneTouch Delica Lancets 33G MISC TEST FASTING GLUCOSE LEVEL EACH MORNING BEFORE BREAKFAST   pantoprazole (PROTONIX) 40 MG tablet Take 40  mg by mouth daily.   ramipril (ALTACE) 2.5 MG capsule Take 1 capsule (2.5 mg total) by mouth 2 (two) times daily.   tamsulosin (FLOMAX) 0.4 MG CAPS capsule Take 0.4 mg by mouth daily.   No facility-administered medications prior to visit.    Review of Systems  All other systems reviewed and are negative.  Except see HPI   {Insert previous labs (optional):23779}  {See past labs  Heme  Chem  Endocrine  Serology  Results Review (optional):1}   Objective    BP 127/68   Pulse 81   Temp 98.1 F (36.7 C)   Ht 5\' 8"  (1.727 m)   Wt 179 lb 11.2 oz (81.5 kg)   SpO2 97%   BMI 27.32 kg/m  {Insert last BP/Wt (optional):23777}  {See vitals history (optional):1}  Physical Exam Vitals  reviewed.  Constitutional:      General: He is in acute distress.     Appearance: Normal appearance. He is not ill-appearing, toxic-appearing or diaphoretic.  HENT:     Head: Normocephalic and atraumatic.     Right Ear: Ear canal and external ear normal. There is impacted cerumen.     Left Ear: Ear canal and external ear normal. There is impacted cerumen.     Nose: Congestion and rhinorrhea present.     Mouth/Throat:     Pharynx: Posterior oropharyngeal erythema present.     Comments: Postnasal drainage Eyes:     General: No scleral icterus.       Right eye: No discharge.        Left eye: No discharge.     Extraocular Movements: Extraocular movements intact.     Conjunctiva/sclera: Conjunctivae normal.     Pupils: Pupils are equal, round, and reactive to light.  Cardiovascular:     Rate and Rhythm: Normal rate and regular rhythm.     Pulses: Normal pulses.     Heart sounds: Normal heart sounds. No murmur heard. Pulmonary:     Effort: Pulmonary effort is normal. No respiratory distress.     Breath sounds: Normal breath sounds. No wheezing or rhonchi.  Abdominal:     General: Abdomen is flat. Bowel sounds are normal.     Palpations: Abdomen is soft.  Musculoskeletal:        General: Normal range of motion.     Cervical back: Normal range of motion and neck supple.     Right lower leg: No edema.     Left lower leg: No edema.  Lymphadenopathy:     Cervical: No cervical adenopathy.  Skin:    General: Skin is warm and dry.     Findings: No rash.  Neurological:     General: No focal deficit present.     Mental Status: He is alert and oriented to person, place, and time. Mental status is at baseline.  Psychiatric:        Behavior: Behavior normal.        Thought Content: Thought content normal.        Judgment: Judgment normal.      No results found for any visits on 09/20/22.  Assessment & Plan        Rhinovirus Infection: Persistent cough, runny nose, and mucus  production for over a month. Previous treatment with Z-Pak and Medrol prednisone was ineffective. The patient has been using a nebulizer as needed. -Start symptomatic treatment with Mucinex, Zyrtec, nasal saline rinse, and Flonase. -Gargle with warm salt water multiple times a day. -Drink  plenty of water and warm tea with honey. -If symptoms persist, contact the office on Monday 09/24/2022 for potential prescription of a different antibiotic or cough medication.  History of Lung Cancer: Patient reports fatigue and decreased energy since treatment with radiation and chemotherapy approximately a year ago. No current respiratory symptoms related to this condition. -No changes to current management.  Follow-up: Patient to contact the office on Monday 09/24/2022 if symptoms persist or worsen.      No follow-ups on file.     The patient was advised to call back or seek an in-person evaluation if the symptoms worsen or if the condition fails to improve as anticipated.  I discussed the assessment and treatment plan with the patient. The patient was provided an opportunity to ask questions and all were answered. The patient agreed with the plan and demonstrated an understanding of the instructions.  I, Debera Lat, PA-C have reviewed all documentation for this visit. The documentation on  09/20/22  for the exam, diagnosis, procedures, and orders are all accurate and complete.  Debera Lat, [facility], MMS [facility] [phone] (phone) [phone] (fax)  [facility]

## 2022-09-24 ENCOUNTER — Ambulatory Visit: Payer: Self-pay | Admitting: *Deleted

## 2022-09-24 DIAGNOSIS — R059 Cough, unspecified: Secondary | ICD-10-CM

## 2022-09-24 NOTE — Telephone Encounter (Signed)
Summary: Sinusitis Advice   Pt evaluated on 09/20/22 for Other sinusitis, unspecified chronicity. Patient reports that he is not feeling better. Please advise       Chief Complaint: continued chest congestion. Told to call back from 09/20/22 OV  Symptoms: nasal congestion better. Worsening cough congestion, white sputum. Headache with coughing. Took nebulizer treatment this am  Frequency: since 09/20/22 Pertinent Negatives: Patient denies chest pain no difficulty breathing no fever reported Disposition: [] ED /[] Urgent Care (no appt availability in office) / [] Appointment(In office/virtual)/ []  [place] [facility]/ [] Home Care/ [] Refused Recommended Disposition /[] [place] [facility]/ [x]  Follow-up with PCP Additional Notes:   Told to call back if not better. Please advise.  Patient would like a call back please       Reason for Disposition  Cough  Answer Assessment - Initial Assessment Questions 1. ONSET: "When did the cough begin?"      Last seen in OV 09/20/22 2. SEVERITY: "How bad is the cough today?"      Same as 09/20/22 3. SPUTUM: "Describe the color of your sputum" (none, dry cough; clear, white, yellow, green)     Whit  4. HEMOPTYSIS: "Are you coughing up any blood?" If so ask: "How much?" (flecks, streaks, tablespoons, etc.)     Na  5. DIFFICULTY BREATHING: "Are you having difficulty breathing?" If Yes, ask: "How bad is it?" (e.g., mild, moderate, severe)    - MILD: No SOB at rest, mild SOB with walking, speaks normally in sentences, can lie down, no retractions, pulse < 100.    - MODERATE: SOB at rest, SOB with minimal exertion and prefers to sit, cannot lie down flat, speaks in phrases, mild retractions, audible wheezing, pulse 100-120.    - SEVERE: Very SOB at rest, speaks in single words, struggling to breathe, sitting hunched forward, retractions, pulse > 120      Denies but did take nebulizer treatment this am  6. FEVER: "Do you have a fever?" If Yes, ask: "What  is your temperature, how was it measured, and when did it start?"     Na  7. CARDIAC HISTORY: "Do you have any history of heart disease?" (e.g., heart attack, congestive heart failure)      See hx  8. LUNG HISTORY: "Do you have any history of lung disease?"  (e.g., pulmonary embolus, asthma, emphysema)     Hx lung CA 9. PE RISK FACTORS: "Do you have a history of blood clots?" (or: recent major surgery, recent prolonged travel, bedridden)     Na  10. OTHER SYMPTOMS: "Do you have any other symptoms?" (e.g., runny nose, wheezing, chest pain)       Nasal congestion better but chest congestion not better. Headache with coughing.  11. PREGNANCY: "Is there any chance you are pregnant?" "When was your last menstrual period?"       na 12. TRAVEL: "Have you traveled out of the country in the last month?" (e.g., travel history, exposures)       na  Protocols used: Cough - Acute Productive-A-AH

## 2022-09-25 ENCOUNTER — Other Ambulatory Visit: Payer: Self-pay | Admitting: Physician Assistant

## 2022-09-25 ENCOUNTER — Telehealth: Payer: Self-pay

## 2022-09-25 ENCOUNTER — Encounter: Payer: Self-pay | Admitting: *Deleted

## 2022-09-25 DIAGNOSIS — R059 Cough, unspecified: Secondary | ICD-10-CM

## 2022-09-25 MED ORDER — GUAIFENESIN-DM 100-10 MG/5ML PO SYRP
5.0000 mL | ORAL_SOLUTION | ORAL | 0 refills | Status: DC | PRN
Start: 2022-09-25 — End: 2023-03-22

## 2022-09-25 MED ORDER — GUAIFENESIN-DM 100-10 MG/5ML PO SYRP
5.0000 mL | ORAL_SOLUTION | ORAL | 0 refills | Status: DC | PRN
Start: 2022-09-25 — End: 2022-09-25

## 2022-09-25 NOTE — Telephone Encounter (Signed)
This encounter was created in error - please disregard.

## 2022-09-25 NOTE — Telephone Encounter (Signed)
Patient returned call to see if provider would prescribed medication for chest congestion such as antibiotic and cough medication. In review of chart , provider Harrie Jeans, PA ordered robitussin DM for patient today. NT called [place] pharmacy and pharmacy staff reports they have not received request to fill medication . Please advise medication for chest congestion and cough med.

## 2022-09-25 NOTE — Telephone Encounter (Signed)
Copied from CRM [id number]. Topic: General - Other >> Sep 25, 2022 11:34 AM Macon Large wrote: Reason for CRM: Pt stated he was suppose to receive a call back yesterday but no one ever called back. Pt requests call back asap.Cb#  [phone]

## 2022-09-28 ENCOUNTER — Other Ambulatory Visit: Payer: Self-pay | Admitting: Family Medicine

## 2022-09-28 DIAGNOSIS — R059 Cough, unspecified: Secondary | ICD-10-CM

## 2022-09-28 NOTE — Telephone Encounter (Signed)
Medication Refill - Medication: guaiFENesin-dextromethorphan (ROBITUSSIN DM) 100-10 MG/5ML syrup   Has the patient contacted their pharmacy? No.  Preferred Pharmacy (with phone number or street name):  [facility] - [place], [place] - [address] Phone: [phone]  Fax: [phone]     Has the patient been seen for an appointment in the last year OR does the patient have an upcoming appointment? Yes.    Agent: Please be advised that RX refills may take up to 3 business days. We ask that you follow-up with your pharmacy.

## 2022-09-28 NOTE — Telephone Encounter (Signed)
Refilled 09/25/22 118 ml. Requested Prescriptions  Refused Prescriptions Disp Refills   guaiFENesin-dextromethorphan (ROBITUSSIN DM) 100-10 MG/5ML syrup 118 mL 0    Sig: Take 5 mLs by mouth every 4 (four) hours as needed for cough.     Ear, Nose, and Throat:  Antitussives/Expectorants Passed - 09/28/2022 11:08 AM      Passed - Valid encounter within last 12 months    Recent Outpatient Visits           1 week ago Other sinusitis, unspecified chronicity   [place] [facility] [place], [place], PA-C   2 months ago Hypertension associated with diabetes [facility])   [place] [facility] [place], Marzella Schlein, MD   3 months ago Hypertension associated with diabetes [facility])   [place] [facility] [name], MD   3 months ago Hypertension associated with diabetes [facility])   [place] [facility] Alfredia Ferguson, PA-C   5 months ago Type 2 diabetes mellitus with other specified complication, without long-term current use of insulin [facility])   [place] [facility] Bacigalupo, Marzella Schlein, MD       Future Appointments             In 1 week Bacigalupo, Marzella Schlein, MD [facility], [facility]   In 5 months [name], Verna Czech, MD [facility]

## 2022-10-08 ENCOUNTER — Emergency Department: Payer: Medicare Other

## 2022-10-08 ENCOUNTER — Observation Stay
Admission: EM | Admit: 2022-10-08 | Discharge: 2022-10-09 | Disposition: A | Discharge status: Home / Self Care | Payer: Medicare Other | Attending: Hospitalist | Admitting: Hospitalist

## 2022-10-08 ENCOUNTER — Ambulatory Visit: Payer: Self-pay | Admitting: *Deleted

## 2022-10-08 ENCOUNTER — Encounter: Payer: Self-pay | Admitting: Emergency Medicine

## 2022-10-08 ENCOUNTER — Other Ambulatory Visit: Payer: Self-pay

## 2022-10-08 DIAGNOSIS — E872 Acidosis, unspecified: Secondary | ICD-10-CM | POA: Diagnosis present

## 2022-10-08 DIAGNOSIS — Z87891 Personal history of nicotine dependence: Secondary | ICD-10-CM | POA: Diagnosis not present

## 2022-10-08 DIAGNOSIS — J449 Chronic obstructive pulmonary disease, unspecified: Secondary | ICD-10-CM | POA: Insufficient documentation

## 2022-10-08 DIAGNOSIS — N179 Acute kidney failure, unspecified: Secondary | ICD-10-CM | POA: Diagnosis not present

## 2022-10-08 DIAGNOSIS — Z85118 Personal history of other malignant neoplasm of bronchus and lung: Secondary | ICD-10-CM | POA: Diagnosis not present

## 2022-10-08 DIAGNOSIS — I2510 Atherosclerotic heart disease of native coronary artery without angina pectoris: Secondary | ICD-10-CM | POA: Insufficient documentation

## 2022-10-08 DIAGNOSIS — Z7901 Long term (current) use of anticoagulants: Secondary | ICD-10-CM | POA: Insufficient documentation

## 2022-10-08 DIAGNOSIS — A419 Sepsis, unspecified organism: Principal | ICD-10-CM

## 2022-10-08 DIAGNOSIS — C801 Malignant (primary) neoplasm, unspecified: Secondary | ICD-10-CM | POA: Diagnosis present

## 2022-10-08 DIAGNOSIS — Z95828 Presence of other vascular implants and grafts: Secondary | ICD-10-CM

## 2022-10-08 DIAGNOSIS — Z7984 Long term (current) use of oral hypoglycemic drugs: Secondary | ICD-10-CM | POA: Insufficient documentation

## 2022-10-08 DIAGNOSIS — C3411 Malignant neoplasm of upper lobe, right bronchus or lung: Secondary | ICD-10-CM | POA: Diagnosis present

## 2022-10-08 DIAGNOSIS — R6520 Severe sepsis without septic shock: Secondary | ICD-10-CM | POA: Diagnosis not present

## 2022-10-08 DIAGNOSIS — E1122 Type 2 diabetes mellitus with diabetic chronic kidney disease: Secondary | ICD-10-CM | POA: Diagnosis not present

## 2022-10-08 DIAGNOSIS — N390 Urinary tract infection, site not specified: Secondary | ICD-10-CM

## 2022-10-08 DIAGNOSIS — R911 Solitary pulmonary nodule: Secondary | ICD-10-CM | POA: Diagnosis not present

## 2022-10-08 DIAGNOSIS — I129 Hypertensive chronic kidney disease with stage 1 through stage 4 chronic kidney disease, or unspecified chronic kidney disease: Secondary | ICD-10-CM | POA: Diagnosis not present

## 2022-10-08 DIAGNOSIS — R079 Chest pain, unspecified: Secondary | ICD-10-CM

## 2022-10-08 DIAGNOSIS — N189 Chronic kidney disease, unspecified: Secondary | ICD-10-CM | POA: Diagnosis not present

## 2022-10-08 DIAGNOSIS — R0789 Other chest pain: Secondary | ICD-10-CM | POA: Diagnosis not present

## 2022-10-08 DIAGNOSIS — Z79899 Other long term (current) drug therapy: Secondary | ICD-10-CM | POA: Diagnosis not present

## 2022-10-08 DIAGNOSIS — R918 Other nonspecific abnormal finding of lung field: Secondary | ICD-10-CM | POA: Diagnosis not present

## 2022-10-08 LAB — LACTIC ACID, PLASMA: Lactic Acid, Venous: 2.9 mmol/L (ref 0.5–1.9)

## 2022-10-08 LAB — URINALYSIS, ROUTINE W REFLEX MICROSCOPIC
Bilirubin Urine: NEGATIVE
Glucose, UA: NEGATIVE mg/dL
Hgb urine dipstick: NEGATIVE
Ketones, ur: NEGATIVE mg/dL
Nitrite: POSITIVE — AB
Protein, ur: NEGATIVE mg/dL
Specific Gravity, Urine: 1.005 (ref 1.005–1.030)
WBC, UA: >50 WBC/hpf (ref 0–5)
pH: 6 (ref 5.0–8.0)

## 2022-10-08 LAB — CBC
HCT: 44.9 % (ref 39.0–52.0)
Hemoglobin: 14.9 g/dL (ref 13.0–17.0)
MCH: 29 pg (ref 26.0–34.0)
MCHC: 33.2 g/dL (ref 30.0–36.0)
MCV: 87.5 fL (ref 80.0–100.0)
Platelets: 196 10*3/uL (ref 150–400)
RBC: 5.13 MIL/uL (ref 4.22–5.81)
RDW: 13.9 % (ref 11.5–15.5)
WBC: 12.8 10*3/uL — ABNORMAL HIGH (ref 4.0–10.5)
nRBC: 0 % (ref 0.0–0.2)

## 2022-10-08 LAB — PROTIME-INR
INR: 1.2 (ref 0.8–1.2)
Prothrombin Time: 14.9 seconds (ref 11.4–15.2)

## 2022-10-08 LAB — BASIC METABOLIC PANEL
Anion gap: 12 (ref 5–15)
BUN: 19 mg/dL (ref 8–23)
CO2: 19 mmol/L — ABNORMAL LOW (ref 22–32)
Calcium: 9 mg/dL (ref 8.9–10.3)
Chloride: 102 mmol/L (ref 98–111)
Creatinine, Ser: 1.32 mg/dL — ABNORMAL HIGH (ref 0.61–1.24)
GFR, Estimated: 56 mL/min — ABNORMAL LOW (ref >60)
Glucose, Bld: 153 mg/dL — ABNORMAL HIGH (ref 70–99)
Potassium: 3.9 mmol/L (ref 3.5–5.1)
Sodium: 133 mmol/L — ABNORMAL LOW (ref 135–145)

## 2022-10-08 LAB — TROPONIN I (HIGH SENSITIVITY)
Troponin I (High Sensitivity): 8 ng/L (ref <18)
Troponin I (High Sensitivity): 7 ng/L (ref <18)

## 2022-10-08 MED ORDER — ACETAMINOPHEN 650 MG RE SUPP: 650.0000 mg | Freq: Four times a day (QID) | RECTAL | Status: DC | PRN

## 2022-10-08 MED ORDER — ENOXAPARIN SODIUM 40 MG/0.4ML IJ SOSY
40.0000 mg | PREFILLED_SYRINGE | INTRAMUSCULAR | Status: DC
  Administered 2022-10-09: 40 mg via SUBCUTANEOUS
  Filled 2022-10-08: qty 0.4

## 2022-10-08 MED ORDER — ONDANSETRON HCL 4 MG PO TABS: 4.0000 mg | ORAL_TABLET | Freq: Four times a day (QID) | ORAL | Status: DC | PRN

## 2022-10-08 MED ORDER — LACTATED RINGERS IV BOLUS
1000.0000 mL | Freq: Once | INTRAVENOUS | Status: AC
  Administered 2022-10-08: 1000 mL via INTRAVENOUS

## 2022-10-08 MED ORDER — ATORVASTATIN CALCIUM 20 MG PO TABS
40.0000 mg | ORAL_TABLET | Freq: Every day | ORAL | Status: DC
  Administered 2022-10-09: 40 mg via ORAL
  Filled 2022-10-08: qty 2

## 2022-10-08 MED ORDER — SODIUM CHLORIDE 0.9 % IV SOLN
2.0000 g | INTRAVENOUS | Status: DC
  Administered 2022-10-08: 2 g via INTRAVENOUS
  Filled 2022-10-08: qty 20

## 2022-10-08 MED ORDER — NITROGLYCERIN 0.4 MG SL SUBL: 0.4000 mg | SUBLINGUAL_TABLET | SUBLINGUAL | Status: DC | PRN

## 2022-10-08 MED ORDER — TAMSULOSIN HCL 0.4 MG PO CAPS
0.4000 mg | ORAL_CAPSULE | Freq: Every day | ORAL | Status: DC
  Administered 2022-10-09: 0.4 mg via ORAL
  Filled 2022-10-08: qty 1

## 2022-10-08 MED ORDER — SODIUM CHLORIDE 0.9 % IV SOLN: 2.0000 g | INTRAVENOUS | Status: DC

## 2022-10-08 MED ORDER — ONDANSETRON HCL 4 MG/2ML IJ SOLN: 4.0000 mg | Freq: Four times a day (QID) | INTRAMUSCULAR | Status: DC | PRN

## 2022-10-08 MED ORDER — LACTATED RINGERS IV SOLN
150.0000 mL/h | INTRAVENOUS | Status: DC
  Administered 2022-10-08 – 2022-10-09 (×3): 150 mL/h via INTRAVENOUS

## 2022-10-08 MED ORDER — HYDROCODONE-ACETAMINOPHEN 5-325 MG PO TABS: 1.0000 | ORAL_TABLET | ORAL | Status: DC | PRN

## 2022-10-08 MED ORDER — RAMIPRIL 2.5 MG PO CAPS
2.5000 mg | ORAL_CAPSULE | Freq: Two times a day (BID) | ORAL | Status: DC
  Administered 2022-10-09: 2.5 mg via ORAL
  Filled 2022-10-08: qty 1

## 2022-10-08 MED ORDER — PANTOPRAZOLE SODIUM 40 MG PO TBEC
40.0000 mg | DELAYED_RELEASE_TABLET | Freq: Every day | ORAL | Status: DC
  Administered 2022-10-09: 40 mg via ORAL
  Filled 2022-10-08: qty 1

## 2022-10-08 MED ORDER — GUAIFENESIN-DM 100-10 MG/5ML PO SYRP: 5.0000 mL | ORAL_SOLUTION | ORAL | Status: DC | PRN

## 2022-10-08 MED ORDER — METOPROLOL TARTRATE 25 MG PO TABS
25.0000 mg | ORAL_TABLET | Freq: Every day | ORAL | Status: DC
  Administered 2022-10-09: 25 mg via ORAL
  Filled 2022-10-08: qty 1

## 2022-10-08 MED ORDER — SODIUM CHLORIDE 0.9 % IV SOLN: 1.0000 g | Freq: Once | INTRAVENOUS | Status: DC

## 2022-10-08 MED ORDER — IOHEXOL 350 MG/ML SOLN
75.0000 mL | Freq: Once | INTRAVENOUS | Status: AC | PRN
  Administered 2022-10-08: 75 mL via INTRAVENOUS

## 2022-10-08 MED ORDER — ALBUTEROL SULFATE (2.5 MG/3ML) 0.083% IN NEBU: 2.5000 mg | INHALATION_SOLUTION | Freq: Four times a day (QID) | RESPIRATORY_TRACT | Status: DC | PRN

## 2022-10-08 MED ORDER — FINASTERIDE 5 MG PO TABS: 5.0000 mg | ORAL_TABLET | Freq: Every day | ORAL | Status: DC

## 2022-10-08 MED ORDER — ACETAMINOPHEN 325 MG PO TABS: 650.0000 mg | ORAL_TABLET | Freq: Four times a day (QID) | ORAL | Status: DC | PRN

## 2022-10-08 NOTE — ED Provider Notes (Signed)
[facility] Provider Note    Event Date/Time   First MD Initiated Contact with Patient 10/08/22 1745     (approximate)   History   Chief Complaint Chest Pain   HPI  Luke Rojas is a 77 y.o. male with past medical history of hypertension, hyperlipidemia, diabetes, CAD, CKD, GERD, and lung cancer who presents to the ED complaining of chest pain.  Patient reports that earlier today he had an episode of chills associated with some pain in his chest and difficulty breathing.  Chills have since resolved but he continues to complain of some mild sharp pain in his chest along with some mild difficulty breathing.  He denies any recent fevers or cough, has had occasional pain in his chest and difficulty breathing for the past month but never chills.  He does endorse some recent dysuria, denies any abdominal pain, flank pain, nausea, or vomiting.     Physical Exam   Triage Vital Signs: ED Triage Vitals [10/08/22 1451]  Encounter Vitals Group     BP 103/61     Systolic BP Percentile      Diastolic BP Percentile      Pulse Rate (!) 124     Resp 20     Temp 98.6 F (37 C)     Temp Source Oral     SpO2 97 %     Weight 180 lb (81.6 kg)     Height 5\' 8"  (1.727 m)     Head Circumference      Peak Flow      Pain Score 8     Pain Loc      Pain Education      Exclude from Growth Chart     Most recent vital signs: Vitals:   10/08/22 1451 10/08/22 1746  BP: 103/61 135/82  Pulse: (!) 124 97  Resp: 20 (!) 22  Temp: 98.6 F (37 C) 98.9 F (37.2 C)  SpO2: 97% 97%    Constitutional: Alert and oriented. Eyes: Conjunctivae are normal. Head: Atraumatic. Nose: No congestion/rhinnorhea. Mouth/Throat: Mucous membranes are moist.  Cardiovascular: Tachycardic, regular rhythm. Grossly normal heart sounds.  2+ radial pulses bilaterally. Respiratory: Normal respiratory effort.  No retractions. Lungs CTAB. Gastrointestinal: Soft and nontender. No  distention. Musculoskeletal: No lower extremity tenderness nor edema.  Neurologic:  Normal speech and language. No gross focal neurologic deficits are appreciated.    ED Results / Procedures / Treatments   Labs (all labs ordered are listed, but only abnormal results are displayed) Labs Reviewed  BASIC METABOLIC PANEL - Abnormal; Notable for the following components:      Result Value   Sodium 133 (*)    CO2 19 (*)    Glucose, Bld 153 (*)    Creatinine, Ser 1.32 (*)    GFR, Estimated 56 (*)    All other components within normal limits  CBC - Abnormal; Notable for the following components:   WBC 12.8 (*)    All other components within normal limits  URINALYSIS, ROUTINE W REFLEX MICROSCOPIC - Abnormal; Notable for the following components:   Color, Urine YELLOW (*)    APPearance HAZY (*)    Nitrite POSITIVE (*)    Leukocytes,Ua LARGE (*)    Bacteria, UA FEW (*)    All other components within normal limits  URINE CULTURE  CULTURE, BLOOD (ROUTINE X 2)  CULTURE, BLOOD (ROUTINE X 2)  PROTIME-INR  LACTIC ACID, PLASMA  LACTIC ACID, PLASMA  TROPONIN  I (HIGH SENSITIVITY)  TROPONIN I (HIGH SENSITIVITY)     EKG  ED ECG REPORT I, Chesley Noon, the attending physician, personally viewed and interpreted this ECG.   Date: 10/08/2022  EKG Time: 14:52  Rate: 122  Rhythm: sinus tachycardia  Axis: Normal  Intervals:none  ST&T Change: Nonspecific ST abnormality  RADIOLOGY Chest x-ray reviewed and interpreted by me with similar right upper lobe opacity, no edema or effusion noted.  PROCEDURES:  Critical Care performed: No  Procedures   MEDICATIONS ORDERED IN ED: Medications  cefTRIAXone (ROCEPHIN) 2 g in sodium chloride 0.9 % 100 mL IVPB (2 g Intravenous New Bag/Given 10/08/22 2230)  lactated ringers bolus 1,000 mL (0 mLs Intravenous Stopped 10/08/22 2233)  iohexol (OMNIPAQUE) 350 MG/ML injection 75 mL (75 mLs Intravenous Contrast Given 10/08/22 1954)     IMPRESSION / MDM  / ASSESSMENT AND PLAN / ED COURSE  I reviewed the triage vital signs and the nursing notes.                              77 y.o. male with past medical history of hypertension, hyperlipidemia, CAD, diabetes, CKD, GERD, and lung cancer who presents to the ED complaining of episode of chills with some mild chest discomfort and difficulty breathing earlier today.  Patient's presentation is most consistent with acute presentation with potential threat to life or bodily function.  Differential diagnosis includes, but is not limited to, ACS, PE, pneumonia, pneumothorax, musculoskeletal pain, GERD, anxiety, anemia, electrolyte abnormality, AKI, UTI, viral illness.  Patient nontoxic-appearing and in no acute distress, vital signs remarkable for borderline tachycardia as well as borderline tachypnea.  EKG shows no evidence of arrhythmia or ischemia and initial troponin is negative, low suspicion for ACS but will check repeat troponin.  With his tachycardia and history of lung cancer, will further assess with CTA chest to rule out PE.  Chest x-ray appears to show lung mass similar compared to previous but CTA will also help rule out occult pneumonia.  With his chills, he also endorses urinary frequency and urinalysis pending at this time.  Labs with mild leukocytosis and mild AKI, no electrolyte abnormality noted.  CTA chest is negative for PE or other acute process, but urinalysis is concerning for infection.  With his sudden onset severe chills I am concerned for sepsis, patient meets sepsis criteria based off of leukocytosis, tachycardia, and tachypnea.  We will start on IV Rocephin and urine was sent for culture.  2 sets of troponin are within normal limits and I doubt ACS.  Case discussed with hospitalist for admission.      FINAL CLINICAL IMPRESSION(S) / ED DIAGNOSES   Final diagnoses:  Sepsis without acute organ dysfunction, due to unspecified organism [facility])  Urinary tract infection without  hematuria, site unspecified  Nonspecific chest pain     Rx / DC Orders   ED Discharge Orders     None        Note:  This document was prepared using Dragon voice recognition software and may include unintentional dictation errors.   Chesley Noon, MD 10/08/22 [phone]

## 2022-10-08 NOTE — Telephone Encounter (Signed)
Reason for Disposition  [1] MODERATE difficulty breathing (e.g., speaks in phrases, SOB even at rest, pulse 100-120) AND [2] NEW-onset or WORSE than normal  Answer Assessment - Initial Assessment Questions 1. RESPIRATORY STATUS: "Describe your breathing?" (e.g., wheezing, shortness of breath, unable to speak, severe coughing)      Cold/shaking 94.2, lower back pain, so cold- SOB 2. ONSET: "When did this breathing problem begin?"      Started this morning 3. PATTERN "Does the difficult breathing come and go, or has it been constant since it started?"      constant 4. SEVERITY: "How bad is your breathing?" (e.g., mild, moderate, severe)    - MILD: No SOB at rest, mild SOB with walking, speaks normally in sentences, can lie down, no retractions, pulse < 100.    - MODERATE: SOB at rest, SOB with minimal exertion and prefers to sit, cannot lie down flat, speaks in phrases, mild retractions, audible wheezing, pulse 100-120.    - SEVERE: Very SOB at rest, speaks in single words, struggling to breathe, sitting hunched forward, retractions, pulse > 120      Patient states he gets choked- struggling 5. RECURRENT SYMPTOM: "Have you had difficulty breathing before?" If Yes, ask: "When was the last time?" and "What happened that time?"      COPD 6. CARDIAC HISTORY: "Do you have any history of heart disease?" (e.g., heart attack, angina, bypass surgery, angioplasty)      Heart disease 7. LUNG HISTORY: "Do you have any history of lung disease?"  (e.g., pulmonary embolus, asthma, emphysema)     SOB 8. CAUSE: "What do you think is causing the breathing problem?"      Not sure 9. OTHER SYMPTOMS: "Do you have any other symptoms? (e.g., dizziness, runny nose, cough, chest pain, fever)     Cough 10. O2 SATURATION MONITOR:  "Do you use an oxygen saturation monitor (pulse oximeter) at home?" If Yes, ask: "What is your reading (oxygen level) today?" "What is your usual oxygen saturation reading?" (e.g., 95%)        95%, 115P  Protocols used: Breathing Difficulty-A-AH

## 2022-10-08 NOTE — Sepsis Progress Note (Signed)
Following for sepsis monitoring ?

## 2022-10-08 NOTE — Progress Notes (Signed)
CODE SEPSIS - PHARMACY COMMUNICATION  **Broad Spectrum Antibiotics should be administered within 1 hour of Sepsis diagnosis**  Time Code Sepsis Called/Page Received: 2210  Antibiotics Ordered: Ceftriaxone  Time of 1st antibiotic administration: 2230  Otelia Sergeant, PharmD, [facility] 10/08/2022 10:13 PM

## 2022-10-08 NOTE — Assessment & Plan Note (Signed)
No acute issues suspected 

## 2022-10-08 NOTE — Assessment & Plan Note (Signed)
Has chest pain but suspect noncardiac.  EKG nonacute and troponins negative Continue atorvastatin, metoprolol, ramipril and nitroglycerin

## 2022-10-08 NOTE — Assessment & Plan Note (Signed)
CTA chest showing stable lung mass and negative for PE

## 2022-10-08 NOTE — H&P (Signed)
History and Physical    Patient: Luke Rojas KGM:[id number] DOB: 02/25/1946 DOA: 10/08/2022 DOS: the patient was seen and examined on 10/08/2022 PCP: [name], MD  Patient coming from: Home  Chief Complaint:  Chief Complaint  Patient presents with   Chest Pain    HPI: [name] is a 77 y.o. male with medical history significant for COPD, DM, CAD, s/p right ICA stent placement for high-grade carotid artery stenosis on 09/11/2021, right lung cancer 09/2021 on XRT and chemo, hospitalized in August 2023 with E. coli UTI and acute diverticulitis, who presents to the ED with sudden onset chills and rigors, shortness of breath and chest pain beyond his baseline.  Denies nausea, vomiting, diarrhea or dysuria. ED course and data review: Tachycardic to 124 and tachypneic to 22 with soft BP of 103/61 on arrival and otherwise normal vitals.  Labs notable for WBC 12,800, lactic acid pending.  BMP with creatinine 1.32 up from baseline of 1.12 with mild metabolic acidosis with bicarb of 19.  Troponin 7.  COVID pending. EKG, personally viewed and interpreted showing sinus tachycardia at 122 with no acute ST-T wave changes. CTA chest with no evidence of PE showing right upper lobe nodule similar size to prior. Patient started on sepsis fluids and ceftriaxone Hospitalist consulted for admission for sepsis secondary to UTI.   Review of Systems: As mentioned in the history of present illness. All other systems reviewed and are negative.  Past Medical History:  Diagnosis Date   Arthritis    OSTEOARTHRITIS   Chronic kidney disease    kidney stones   COPD (chronic obstructive pulmonary disease) (HCC)    Coronary artery disease    2 stents    Diabetes mellitus without complication (HCC)    GERD (gastroesophageal reflux disease)    Hypercholesteremia    Hypertension    Lung cancer (HCC)    Myocardial infarction [facility]) 2013   Renal colic    Past Surgical History:  Procedure  Laterality Date   CARDIAC CATHETERIZATION  2013   2 stents   CAROTID PTA/STENT INTERVENTION Right 09/11/2021   Procedure: CAROTID PTA/STENT INTERVENTION;  Surgeon: [name], MD;  Location: [facility] IN[facility]IVE [facility];  Service: Cardiovascular;  Laterality: Right;   CATARACT EXTRACTION, BILATERAL     COLONOSCOPY  04/27/05   COLONOSCOPY WITH PROPOFOL N/A 06/08/2015   Procedure: COLONOSCOPY WITH PROPOFOL;  Surgeon: [name], MD;  Location: [facility] ENDOSCOPY;  Service: Endoscopy;  Laterality: N/A;   CYSTOSCOPY WITH INSERTION OF UROLIFT N/A 02/11/2020   Procedure: CYSTOSCOPY WITH INSERTION OF UROLIFT;  Surgeon: [name], MD;  Location: [facility] ORS;  Service: Urology;  Laterality: N/A;   CYSTOSCOPY WITH STENT PLACEMENT Right 09/16/2016   Procedure: CYSTOSCOPY WITH STENT PLACEMENT;  Surgeon: [name], MD;  Location: [facility] ORS;  Service: Urology;  Laterality: Right;   EXTRACORPOREAL SHOCK WAVE LITHOTRIPSY Left 11/04/2014   has had 2 previous lithotripsies   EXTRACORPOREAL SHOCK WAVE LITHOTRIPSY Left 03/24/2015   Procedure: EXTRACORPOREAL SHOCK WAVE LITHOTRIPSY (ESWL);  Surgeon: [name], MD;  Location: [facility] ORS;  Service: Urology;  Laterality: Left;   EXTRACORPOREAL SHOCK WAVE LITHOTRIPSY Right 10/04/2016   Procedure: EXTRACORPOREAL SHOCK WAVE LITHOTRIPSY (ESWL);  Surgeon: [name], MD;  Location: [facility] ORS;  Service: Urology;  Laterality: Right;   LEFT HEART CATH AND CORONARY ANGIOGRAPHY N/A 12/05/2018   Procedure: LEFT HEART CATH AND CORONARY ANGIOGRAPHY with possible pci and stent;  Surgeon: [name], MD;  Location: [facility];  Service: Cardiovascular;  Laterality: N/A;   PORTA CATH INSERTION Right 09/25/2021   Procedure: PORTA CATH INSERTION;  Surgeon: [name], MD;  Location: [facility];  Service: Cardiovascular;  Laterality: Right;   Social History:  reports that he quit smoking about 38 years ago. His smoking use included cigarettes. He  started smoking about 68 years ago. He has a 30 pack-year smoking history. His smokeless tobacco use includes chew. He reports that he does not drink alcohol and does not use drugs.  Allergies  Allergen Reactions   Codeine Nausea Only, Nausea And Vomiting and Other (See Comments)    Other reaction(s): Vomiting    Family History  Problem Relation Age of Onset   Pulmonary embolism Mother    Transient ischemic attack Mother    Diabetes Mother    Pancreatic cancer Father    Hypertension Father    Diabetes Father    Cirrhosis Brother     Prior to Admission medications   Medication Sig Start Date End Date Taking? Authorizing Provider  albuterol (PROVENTIL) (2.5 MG/3ML) 0.083% nebulizer solution Take 3 mLs (2.5 mg total) by nebulization every 6 (six) hours as needed for wheezing or shortness of breath. 01/15/22   [name], MD  atorvastatin (LIPITOR) 40 MG tablet Take 1 tablet (40 mg total) by mouth daily. 04/16/22   [name], MD  cetirizine (ZYRTEC) 10 MG tablet Take 1/2 tablet daily 09/20/22   Debera Lat, PA-C  clopidogrel (PLAVIX) 75 MG tablet Take 75 mg by mouth daily.    [provider]  co-enzyme Q-10 30 MG capsule Take 30 mg by mouth daily.    [provider]  Cranberry-Vitamin C-Probiotic (AZO CRANBERRY PO) Take 2 tablets by mouth daily at 12 noon.    [provider]  diclofenac sodium (VOLTAREN) 1 % GEL Apply 2 g topically 4 (four) times daily. 07/08/17   Chrismon, Jodell Cipro, PA-C  finasteride (PROSCAR) 5 MG tablet Take by mouth. 06/26/16   [provider]  guaiFENesin-dextromethorphan (ROBITUSSIN DM) 100-10 MG/5ML syrup Take 5 mLs by mouth every 4 (four) hours as needed for cough. 09/25/22   Debera Lat, PA-C  metFORMIN (GLUCOPHAGE-XR) 750 MG 24 hr tablet  04/10/21   [provider]  metoprolol tartrate (LOPRESSOR) 25 MG tablet Take 1 tablet (25 mg total) by mouth 2 (two) times daily. Patient taking differently: Take  25 mg by mouth daily at 12 noon. 11/25/18   Enid Baas, MD  MULTIPLE VITAMIN PO Take 1 tablet by mouth daily.    [provider]  nitroGLYCERIN (NITROSTAT) 0.4 MG SL tablet Place under the tongue. 07/09/22   [provider]  Omega-3 Fatty Acids (FISH OIL) 1000 MG CAPS Take by mouth.    [provider]  OneTouch Delica Lancets 33G MISC TEST FASTING GLUCOSE LEVEL EACH MORNING BEFORE BREAKFAST 04/29/20   Bosie Clos, MD  pantoprazole (PROTONIX) 40 MG tablet Take 40 mg by mouth daily. 11/14/21   [provider]  ramipril (ALTACE) 2.5 MG capsule Take 1 capsule (2.5 mg total) by mouth 2 (two) times daily. 06/14/22   [name], MD  tamsulosin (FLOMAX) 0.4 MG CAPS capsule Take 0.4 mg by mouth daily. 02/12/19   [provider]    Physical Exam: Vitals:   10/08/22 1451 10/08/22 1746 10/08/22 1749  BP: 103/61 135/82   Pulse: (!) 124 97   Resp: 20 (!) 22   Temp: 98.6  F (37 C) 98.9 F (37.2 C)   TempSrc: Oral Oral   SpO2: 97% 97%   Weight: 81.6 kg  81.6 kg  Height: 5\' 8"  (1.727 m)  5\' 9"  (1.753 m)   Physical Exam Vitals and nursing note reviewed.  Constitutional:      General: He is not in acute distress. HENT:     Head: Normocephalic and atraumatic.  Cardiovascular:     Rate and Rhythm: Regular rhythm. Tachycardia present.     Heart sounds: Normal heart sounds.  Pulmonary:     Effort: Pulmonary effort is normal.     Breath sounds: Normal breath sounds.  Abdominal:     Palpations: Abdomen is soft.     Tenderness: There is no abdominal tenderness.  Neurological:     Mental Status: Mental status is at baseline.     Labs on Admission: I have personally reviewed following labs and imaging studies  CBC: Recent Labs  Lab 10/08/22 1454  WBC 12.8*  HGB 14.9  HCT 44.9  MCV 87.5  PLT 196   Basic Metabolic Panel: Recent Labs  Lab 10/08/22 1454  NA 133*  K 3.9  CL 102  CO2 19*  GLUCOSE 153*  BUN 19  CREATININE  1.32*  CALCIUM 9.0   GFR: Estimated Creatinine Clearance: 46.9 mL/min (A) (by C-G formula based on SCr of 1.32 mg/dL (H)). Liver Function Tests: No results for input(s): "AST", "ALT", "ALKPHOS", "BILITOT", "PROT", "ALBUMIN" in the last 168 hours. No results for input(s): "LIPASE", "AMYLASE" in the last 168 hours. No results for input(s): "AMMONIA" in the last 168 hours. Coagulation Profile: Recent Labs  Lab 10/08/22 1454  INR 1.2   Cardiac Enzymes: No results for input(s): "CKTOTAL", "CKMB", "CKMBINDEX", "TROPONINI" in the last 168 hours. BNP (last 3 results) No results for input(s): "PROBNP" in the last 8760 hours. HbA1C: No results for input(s): "HGBA1C" in the last 72 hours. CBG: No results for input(s): "GLUCAP" in the last 168 hours. Lipid Profile: No results for input(s): "CHOL", "HDL", "LDLCALC", "TRIG", "CHOLHDL", "LDLDIRECT" in the last 72 hours. Thyroid Function Tests: No results for input(s): "TSH", "T4TOTAL", "FREET4", "T3FREE", "THYROIDAB" in the last 72 hours. Anemia Panel: No results for input(s): "VITAMINB12", "FOLATE", "FERRITIN", "TIBC", "IRON", "RETICCTPCT" in the last 72 hours. Urine analysis:    Component Value Date/Time   COLORURINE YELLOW (A) 10/08/2022 1804   APPEARANCEUR HAZY (A) 10/08/2022 1804   APPEARANCEUR Cloudy (A) 08/27/2022 0905   LABSPEC 1.005 10/08/2022 1804   LABSPEC 1.018 10/02/2011 1002   PHURINE 6.0 10/08/2022 1804   GLUCOSEU NEGATIVE 10/08/2022 1804   GLUCOSEU Negative 10/02/2011 1002   HGBUR NEGATIVE 10/08/2022 1804   BILIRUBINUR NEGATIVE 10/08/2022 1804   BILIRUBINUR Negative 08/27/2022 0905   BILIRUBINUR Negative 10/02/2011 1002   KETONESUR NEGATIVE 10/08/2022 1804   PROTEINUR NEGATIVE 10/08/2022 1804   UROBILINOGEN 0.2 12/08/2019 1329   NITRITE POSITIVE (A) 10/08/2022 1804   LEUKOCYTESUR LARGE (A) 10/08/2022 1804   LEUKOCYTESUR Negative 10/02/2011 1002    Radiological Exams on Admission: DG Chest 2 View  Result Date:  10/08/2022 CLINICAL DATA:  cp EXAM: CHEST - 2 VIEW COMPARISON:  CT scan chest from 07/11/2022. FINDINGS: Redemonstration of linear homogeneous opacity overlying the right upper/mid lung zone junction region with associated surrounding increased interstitial markings. There is right hilar density, which corresponds to enlarged right hilar lymph nodes. Bilateral lung fields are otherwise clear. Bilateral costophrenic angles are clear. Normal cardio-mediastinal silhouette. No acute osseous abnormalities. Old healed fracture deformities of  bilateral clavicles noted. The soft tissues are within normal limits. Right-sided CT Port-A-Cath is seen with its tip overlying the right atrium. IMPRESSION: 1. Redemonstration of linear homogeneous opacity overlying the right upper/mid lung zone junction region with associated surrounding increased interstitial markings. This may reflect scarring or atelectasis. 2. Right hilar density, which corresponds to enlarged right hilar lymph nodes. Electronically Signed   By: Jules Schick M.D.   On: 10/08/2022 15:45     Data Reviewed: Relevant notes from primary care and specialist visits, past discharge summaries as available in EHR, including Care Everywhere. Prior diagnostic testing as pertinent to current admission diagnoses Updated medications and problem lists for reconciliation ED course, including vitals, labs, imaging, treatment and response to treatment Triage notes, nursing and pharmacy notes and ED provider's notes Notable results as noted in HPI   Assessment and Plan: Urinary tract infection History of E. coli UTI 10/2021 Suspect sepsis Sepsis criteria include tachycardia and tachypnea with AKI leukocytosis and abnormal UA Continue sepsis fluids Continue Rocephin Follow cultures  AKI (acute kidney injury) (HCC) Metabolic acidosis Creatinine 1.23 up from baseline of 1.16 with bicarb of 19, likely ATN from sepsis Expecting improvement to baseline with IV  fluid resuscitation  Chest pain Patient reported chest pain however troponin negative x 2, EKG nonacute, subtle low suspicion for cardiac etiology CTA chest showing stable lung mass without PE Pain control Continue to monitor  S/p right Internal carotid artery stent 09/11/21 No acute issues suspected  Malignant neoplasm of upper lobe of right lung (HCC) CTA chest showing stable lung mass and negative for PE  CAD (coronary artery disease) Has chest pain but suspect noncardiac.  EKG nonacute and troponins negative Continue atorvastatin, metoprolol, ramipril and nitroglycerin     DVT prophylaxis: Lovenox  Consults: none  Advance Care Planning:   Code Status: Prior   Family Communication: none  Disposition Plan: Back to previous home environment  Severity of Illness: The appropriate patient status for this patient is INPATIENT. Inpatient status is judged to be reasonable and necessary in order to provide the required intensity of service to ensure the patient's safety. The patient's presenting symptoms, physical exam findings, and initial radiographic and laboratory data in the context of their chronic comorbidities is felt to place them at high risk for further clinical deterioration. Furthermore, it is not anticipated that the patient will be medically stable for discharge from the hospital within 2 midnights of admission.   * I certify that at the point of admission it is my clinical judgment that the patient will require inpatient hospital care spanning beyond 2 midnights from the point of admission due to high intensity of service, high risk for further deterioration and high frequency of surveillance required.*  Author: Andris Baumann, MD 10/08/2022 10:51 PM  For on call review [email or web address].

## 2022-10-08 NOTE — Telephone Encounter (Signed)
  Chief Complaint: patient's wife,Kay(DPR) calling- patient is experiencing chills, SOB-labored breathing, cough, body aches (patient moaning when answers) Symptoms: see above Frequency: ongoing illness for over 1 week- but worse today  Disposition: [x] ED /[] Urgent Care (no appt availability in office) / [] Appointment(In office/virtual)/ []  [facility]/ [] Home Care/ [] Refused Recommended Disposition /[] [facility]/ []  Follow-up with PCP Additional Notes: Due to severity and change in symptoms- advised ED- wife has been advised call EMS for transport- do not want her or patient to fall trying to get out of the house.

## 2022-10-08 NOTE — Assessment & Plan Note (Signed)
History of E. coli UTI 10/2021 Suspect sepsis Sepsis criteria include tachycardia and tachypnea with AKI leukocytosis and abnormal UA Continue sepsis fluids Continue Rocephin Follow cultures

## 2022-10-08 NOTE — Assessment & Plan Note (Signed)
Patient reported chest pain however troponin negative x 2, EKG nonacute, subtle low suspicion for cardiac etiology CTA chest showing stable lung mass without PE Pain control Continue to monitor

## 2022-10-08 NOTE — ED Notes (Signed)
Pt to ED with vague chest pain "in my lungs" since 3-4 weeks. Coarse crackles heard to R lower lobe on back. Getting chemo for lung cancer.

## 2022-10-08 NOTE — ED Triage Notes (Signed)
Patient to ED via POV for intermittent left sided chest pain. Patient states CP and SOB ongoing x1 month but worse today. Hx of MI, lung cancer

## 2022-10-08 NOTE — Assessment & Plan Note (Signed)
Metabolic acidosis Creatinine 1.23 up from baseline of 1.16 with bicarb of 19, likely ATN from sepsis Expecting improvement to baseline with IV fluid resuscitation

## 2022-10-08 NOTE — Telephone Encounter (Signed)
Noted  

## 2022-10-09 DIAGNOSIS — N390 Urinary tract infection, site not specified: Secondary | ICD-10-CM | POA: Diagnosis not present

## 2022-10-09 DIAGNOSIS — A419 Sepsis, unspecified organism: Secondary | ICD-10-CM | POA: Diagnosis not present

## 2022-10-09 LAB — GLUCOSE, CAPILLARY
Glucose-Capillary: 155 mg/dL — ABNORMAL HIGH (ref 70–99)
Glucose-Capillary: 165 mg/dL — ABNORMAL HIGH (ref 70–99)

## 2022-10-09 LAB — LACTIC ACID, PLASMA: Lactic Acid, Venous: 1.2 mmol/L (ref 0.5–1.9)

## 2022-10-09 MED ORDER — HEPARIN SOD (PORK) LOCK FLUSH 100 UNIT/ML IV SOLN
500.0000 [IU] | Freq: Once | INTRAVENOUS | Status: DC
  Filled 2022-10-09: qty 5

## 2022-10-09 MED ORDER — CHLORHEXIDINE GLUCONATE CLOTH 2 % EX PADS
6.0000 | MEDICATED_PAD | Freq: Every day | CUTANEOUS | Status: DC
  Administered 2022-10-09: 6 via TOPICAL

## 2022-10-09 NOTE — Care Management Obs Status (Signed)
MEDICARE OBSERVATION STATUS NOTIFICATION   Patient Details  Name: NASR DOOMS MRN: [id number] Date of Birth: September 10, 1945   Medicare Observation Status Notification Given:  Yes     , LCSW 10/09/2022, 11:27 AM

## 2022-10-09 NOTE — Care Management CC44 (Signed)
Condition Code 44 Documentation Completed  Patient Details  Name: MUHAMMADYUSUF RAZI MRN: [id number] Date of Birth: 09-29-1945   Condition Code 44 given:  Yes Patient signature on Condition Code 44 notice:  Yes Documentation of 2 MD's agreement:  Yes Code 44 added to claim:  Yes    Allena Katz, LCSW 10/09/2022, 11:27 AM

## 2022-10-09 NOTE — Discharge Summary (Signed)
Physician Discharge Summary   DENA CAMMAROTA  male DOB: June 24, 1945  WUJ:[id number]  PCP: [name], MD  Admit date: 10/08/2022 Discharge date: 10/09/2022  Admitted From: home Disposition:  home CODE STATUS: Full code  Discharge Instructions     Diet - low sodium heart healthy   Complete by: As directed    Discharge instructions   Complete by: As directed    Since your chest pain resolved and you want to go home now, please make sure you follow up with your outpatient cardiologist Dr. Juliann Pares. [facility] Course:  For full details, please see H&P, progress notes, consult notes and ancillary notes.  Briefly,  CRAMER MCNERNEY is a 77 y.o. male with medical history significant for COPD, DM, CAD, s/p right ICA stent placement for high-grade carotid artery stenosis on 09/11/2021, right lung cancer 09/2021 on XRT and chemo, hospitalized in August 2023 with E. coli UTI and acute diverticulitis, who presented to the ED with sudden onset chills and rigors, shortness of breath and chest pain beyond his baseline.   Chest pain Hx of CAD Patient reported chest pain however troponin negative x 2, EKG nonacute.  Chest pain resolved the next day. --pt does have hx of severe CAD followed by Dr. Juliann Pares.  I offered inpatient cardio consult, however, pt reported chest pain resolved and wanted to be discharged.  Pt said he will follow up with Dr. Juliann Pares as outpatient soon. --cont home plavix, statin --cont home Lopressor  Urinary tract infection, ruled out Sepsis, ruled out --pt denied significant urinary symptom.  Abx was not continued.   AKI, ruled out --did not meet criteria   S/p right Internal carotid artery stent 09/11/21 --cont home plavix, statin   Malignant neoplasm of upper lobe of right lung (HCC)   Unless noted above, medications under "STOP" list are ones pt was not taking PTA.  Discharge Diagnoses:  Principal Problem:   Sepsis secondary to UTI  [facility]) Active Problems:   Urinary tract infection   AKI (acute kidney injury) (HCC)   Metabolic acidosis   Chest pain   CAD (coronary artery disease)   Malignant neoplasm of upper lobe of right lung [facility])   S/p right Internal carotid artery stent 09/11/21   30 Day Unplanned Readmission Risk Score    Flowsheet Row ED to Hosp-Admission (Current) from 10/08/2022 in [facility] 1C MEDICAL TELEMETRY  30 Day Unplanned Readmission Risk Score (%) 14.44 Filed at 10/09/2022 0801       This score is the patient's risk of an unplanned readmission within 30 days of being discharged (0 -100%). The score is based on dignosis, age, lab data, medications, orders, and past utilization.   Low:  0-14.9   Medium: 15-21.9   High: 22-29.9   Extreme: 30 and above         Discharge Instructions:  Allergies as of 10/09/2022       Reactions   Codeine Nausea Only, Nausea And Vomiting, Other (See Comments)   Other reaction(s): Vomiting        Medication List     STOP taking these medications    finasteride 5 MG tablet Commonly known as: PROSCAR   metFORMIN 750 MG 24 hr tablet Commonly known as: GLUCOPHAGE-XR       TAKE these medications    albuterol (2.5 MG/3ML) 0.083% nebulizer solution Commonly known as: PROVENTIL Take 3 mLs (2.5 mg total) by nebulization every 6 (six)  hours as needed for wheezing or shortness of breath.   atorvastatin 40 MG tablet Commonly known as: LIPITOR Take 1 tablet (40 mg total) by mouth daily.   AZO CRANBERRY PO Take 2 tablets by mouth daily at 12 noon.   cetirizine 10 MG tablet Commonly known as: ZYRTEC Take 1/2 tablet daily   clopidogrel 75 MG tablet Commonly known as: PLAVIX Take 75 mg by mouth daily.   co-enzyme Q-10 30 MG capsule Take 30 mg by mouth daily.   diclofenac sodium 1 % Gel Commonly known as: VOLTAREN Apply 2 g topically 4 (four) times daily.   Fish Oil 1000 MG Caps Take by mouth.    guaiFENesin-dextromethorphan 100-10 MG/5ML syrup Commonly known as: ROBITUSSIN DM Take 5 mLs by mouth every 4 (four) hours as needed for cough.   metoprolol tartrate 25 MG tablet Commonly known as: LOPRESSOR Take 1 tablet (25 mg total) by mouth 2 (two) times daily.   MULTIPLE VITAMIN PO Take 1 tablet by mouth daily.   nitroGLYCERIN 0.4 MG SL tablet Commonly known as: NITROSTAT Place under the tongue.   OneTouch Delica Lancets 33G Misc TEST FASTING GLUCOSE LEVEL EACH MORNING BEFORE BREAKFAST   pantoprazole 40 MG tablet Commonly known as: PROTONIX Take 40 mg by mouth daily.   ramipril 2.5 MG capsule Commonly known as: ALTACE Take 1 capsule (2.5 mg total) by mouth 2 (two) times daily.   tamsulosin 0.4 MG Caps capsule Commonly known as: FLOMAX Take 0.4 mg by mouth daily.         Follow-up Information     Callwood, Dwayne D, MD Follow up in 1 week(s).   Specialties: Cardiology, Internal Medicine Contact information: [address] [place] [address] [phone]                 Allergies  Allergen Reactions   Codeine Nausea Only, Nausea And Vomiting and Other (See Comments)    Other reaction(s): Vomiting     The results of significant diagnostics from this hospitalization (including imaging, microbiology, ancillary and laboratory) are listed below for reference.   Consultations:   Procedures/Studies: CT Angio Chest PE W/Cm &/Or Wo Cm  Result Date: 10/08/2022 CLINICAL DATA:  Pulmonary embolism (PE) suspected, high prob EXAM: CT ANGIOGRAPHY CHEST WITH CONTRAST TECHNIQUE: Multidetector CT imaging of the chest was performed using the standard protocol during bolus administration of intravenous contrast. Multiplanar CT image reconstructions and MIPs were obtained to evaluate the vascular anatomy. RADIATION DOSE REDUCTION: This exam was performed according to the departmental dose-optimization program which includes automated exposure control,  adjustment of the mA and/or kV according to patient size and/or use of iterative reconstruction technique. CONTRAST:  75mL OMNIPAQUE IOHEXOL 350 MG/ML SOLN COMPARISON:  07/11/2022 FINDINGS: Cardiovascular: No filling defects in the pulmonary arteries to suggest pulmonary emboli. Heart is normal size. Aorta is normal caliber. Extensive three vessel coronary artery disease and moderate aortic atherosclerosis. Mediastinum/Nodes: Subcarinal lymph node with a short axis diameter of 11 mm compared to 14 mm previously. No additional enlarged mediastinal lymph nodes. No visible hilar or axillary adenopathy. Trachea and esophagus are unremarkable. Thyroid unremarkable. Lungs/Pleura: Mild centrilobular emphysema. Radiation induced fibrosis in the mid right upper lobe and superior segment of the right lower lobe are stable. Right upper lobe nodule centered in the area of radiation fibrosis measures 2.5 x 2.2 cm on image 48/5 compared to 2.6 x 2.1 cm previously, not significantly changed. No new or enlarging pulmonary nodules. No effusions. Bibasilar interstitial thickening, likely  scarring/fibrosis. Upper Abdomen: No acute findings Musculoskeletal: Chest wall soft tissues are unremarkable. No acute bony abnormality. Review of the MIP images confirms the above findings. IMPRESSION: Right upper lobe nodule is similar size with surrounding radiation fibrosis. No new or enlarging pulmonary nodules. No evidence of pulmonary embolus. Diffuse 3 vessel coronary artery disease. No acute cardiopulmonary disease. Aortic Atherosclerosis (ICD10-I70.0) and Emphysema (ICD10-J43.9). Electronically Signed   By: Charlett Nose M.D.   On: 10/08/2022 20:28   DG Chest 2 View  Result Date: 10/08/2022 CLINICAL DATA:  cp EXAM: CHEST - 2 VIEW COMPARISON:  CT scan chest from 07/11/2022. FINDINGS: Redemonstration of linear homogeneous opacity overlying the right upper/mid lung zone junction region with associated surrounding increased interstitial  markings. There is right hilar density, which corresponds to enlarged right hilar lymph nodes. Bilateral lung fields are otherwise clear. Bilateral costophrenic angles are clear. Normal cardio-mediastinal silhouette. No acute osseous abnormalities. Old healed fracture deformities of bilateral clavicles noted. The soft tissues are within normal limits. Right-sided CT Port-A-Cath is seen with its tip overlying the right atrium. IMPRESSION: 1. Redemonstration of linear homogeneous opacity overlying the right upper/mid lung zone junction region with associated surrounding increased interstitial markings. This may reflect scarring or atelectasis. 2. Right hilar density, which corresponds to enlarged right hilar lymph nodes. Electronically Signed   By: Jules Schick M.D.   On: 10/08/2022 15:45      Labs: BNP (last 3 results) No results for input(s): "BNP" in the last 8760 hours. Basic Metabolic Panel: Recent Labs  Lab 10/08/22 1454 10/08/22 2341  NA 133*  --   K 3.9  --   CL 102  --   CO2 19*  --   GLUCOSE 153*  --   BUN 19  --   CREATININE 1.32* 1.18  CALCIUM 9.0  --    Liver Function Tests: No results for input(s): "AST", "ALT", "ALKPHOS", "BILITOT", "PROT", "ALBUMIN" in the last 168 hours. No results for input(s): "LIPASE", "AMYLASE" in the last 168 hours. No results for input(s): "AMMONIA" in the last 168 hours. CBC: Recent Labs  Lab 10/08/22 1454 10/08/22 2341  WBC 12.8* 9.1  HGB 14.9 13.2  HCT 44.9 39.1  MCV 87.5 88.1  PLT 196 156   Cardiac Enzymes: No results for input(s): "CKTOTAL", "CKMB", "CKMBINDEX", "TROPONINI" in the last 168 hours. BNP: Invalid input(s): "POCBNP" CBG: Recent Labs  Lab 10/09/22 0016 10/09/22 0739  GLUCAP 155* 165*   D-Dimer No results for input(s): "DDIMER" in the last 72 hours. Hgb A1c No results for input(s): "HGBA1C" in the last 72 hours. Lipid Profile No results for input(s): "CHOL", "HDL", "LDLCALC", "TRIG", "CHOLHDL", "LDLDIRECT" in  the last 72 hours. Thyroid function studies No results for input(s): "TSH", "T4TOTAL", "T3FREE", "THYROIDAB" in the last 72 hours.  Invalid input(s): "FREET3" Anemia work up No results for input(s): "VITAMINB12", "FOLATE", "FERRITIN", "TIBC", "IRON", "RETICCTPCT" in the last 72 hours. Urinalysis    Component Value Date/Time   COLORURINE YELLOW (A) 10/08/2022 1804   APPEARANCEUR HAZY (A) 10/08/2022 1804   APPEARANCEUR Cloudy (A) 08/27/2022 0905   LABSPEC 1.005 10/08/2022 1804   LABSPEC 1.018 10/02/2011 1002   PHURINE 6.0 10/08/2022 1804   GLUCOSEU NEGATIVE 10/08/2022 1804   GLUCOSEU Negative 10/02/2011 1002   HGBUR NEGATIVE 10/08/2022 1804   BILIRUBINUR NEGATIVE 10/08/2022 1804   BILIRUBINUR Negative 08/27/2022 0905   BILIRUBINUR Negative 10/02/2011 1002   KETONESUR NEGATIVE 10/08/2022 1804   PROTEINUR NEGATIVE 10/08/2022 1804   UROBILINOGEN 0.2 12/08/2019 1329  NITRITE POSITIVE (A) 10/08/2022 1804   LEUKOCYTESUR LARGE (A) 10/08/2022 1804   LEUKOCYTESUR Negative 10/02/2011 1002   Sepsis Labs Recent Labs  Lab 10/08/22 1454 10/08/22 2341  WBC 12.8* 9.1   Microbiology Recent Results (from the past 240 hour(s))  Culture, blood (routine x 2)     Status: None (Preliminary result)   Collection Time: 10/08/22 10:30 PM   Specimen: BLOOD  Result Value Ref Range Status   Specimen Description BLOOD RIGHT ANTECUBITAL  Final   Special Requests   Final    BOTTLES DRAWN AEROBIC AND ANAEROBIC Blood Culture adequate volume   Culture   Final    NO GROWTH < 12 HOURS Performed at [facility], [address] [place], [place] [address]    Report Status PENDING  Incomplete  Culture, blood (routine x 2)     Status: None (Preliminary result)   Collection Time: 10/08/22 10:30 PM   Specimen: BLOOD  Result Value Ref Range Status   Specimen Description BLOOD LEFT ANTECUBITAL  Final   Special Requests   Final    BOTTLES DRAWN AEROBIC AND ANAEROBIC Blood Culture adequate volume    Culture   Final    NO GROWTH < 12 HOURS Performed at [facility], [address] [place], [place] [address]    Report Status PENDING  Incomplete     Total time spend on discharging this patient, including the last patient exam, discussing the hospital stay, instructions for ongoing care as it relates to all pertinent caregivers, as well as preparing the medical discharge records, prescriptions, and/or referrals as applicable, is 40 minutes.    Darlin Priestly, MD  [facility] 10/09/2022, 11:24 AM

## 2022-10-11 ENCOUNTER — Encounter: Payer: Self-pay | Admitting: Family Medicine

## 2022-10-11 ENCOUNTER — Ambulatory Visit (INDEPENDENT_AMBULATORY_CARE_PROVIDER_SITE_OTHER): Payer: Medicare Other | Admitting: Family Medicine

## 2022-10-11 VITALS — BP 117/71 | HR 81 | Temp 97.6°F | Resp 12 | Ht 69.0 in | Wt 176.1 lb

## 2022-10-11 DIAGNOSIS — C7801 Secondary malignant neoplasm of right lung: Secondary | ICD-10-CM

## 2022-10-11 DIAGNOSIS — E785 Hyperlipidemia, unspecified: Secondary | ICD-10-CM | POA: Diagnosis not present

## 2022-10-11 DIAGNOSIS — E1169 Type 2 diabetes mellitus with other specified complication: Secondary | ICD-10-CM

## 2022-10-11 DIAGNOSIS — R079 Chest pain, unspecified: Secondary | ICD-10-CM | POA: Diagnosis not present

## 2022-10-11 DIAGNOSIS — I152 Hypertension secondary to endocrine disorders: Secondary | ICD-10-CM

## 2022-10-11 DIAGNOSIS — C801 Malignant (primary) neoplasm, unspecified: Secondary | ICD-10-CM

## 2022-10-11 DIAGNOSIS — I2510 Atherosclerotic heart disease of native coronary artery without angina pectoris: Secondary | ICD-10-CM | POA: Diagnosis not present

## 2022-10-11 DIAGNOSIS — E1159 Type 2 diabetes mellitus with other circulatory complications: Secondary | ICD-10-CM | POA: Diagnosis not present

## 2022-10-11 DIAGNOSIS — I714 Abdominal aortic aneurysm, without rupture, unspecified: Secondary | ICD-10-CM | POA: Diagnosis not present

## 2022-10-11 DIAGNOSIS — J410 Simple chronic bronchitis: Secondary | ICD-10-CM

## 2022-10-11 MED ORDER — ALBUTEROL SULFATE HFA 108 (90 BASE) MCG/ACT IN AERS: 2.0000 | INHALATION_SPRAY | Freq: Four times a day (QID) | RESPIRATORY_TRACT | 5 refills | Status: DC | PRN

## 2022-10-11 NOTE — Assessment & Plan Note (Signed)
Resolved after hospitalization Has f/u scheduled with cardiology

## 2022-10-11 NOTE — Assessment & Plan Note (Signed)
No acute exacerbation Nebs prn and will send in albuterol MDI

## 2022-10-11 NOTE — Assessment & Plan Note (Signed)
Previously well controlled Continue statin Repeat FLP and CMP Goal LDL < 70 

## 2022-10-11 NOTE — Progress Notes (Signed)
Established Patient Office Visit  Subjective   Patient ID: JAIRUS MARTUS, male    DOB: 1946/02/12  Age: 77 y.o. MRN: [id number]  Chief Complaint  Patient presents with   Medical Management of Chronic Issues    Patient reports good compliance and tolerance with medications. He reports chest pain x 2 months. He was seen at ED on 10/08/22. He was treated for UTI. Patient reports persistent cough. He reports robitussin DM is not helping. He is using albuterol as needed. He reports no improvement cough. He reports cough is worse when he talks.     HPI  Discussed the use of AI scribe software for clinical note transcription with the patient, who gave verbal consent to proceed.  History of Present Illness   A 77 year old patient with a significant medical history of COPD, diabetes, CAD status post right ICA stent placement, right lung cancer on radiation and chemo, E. Coli UTI, and acute diverticulitis presents for follow-up after recent hospitalization. The patient was admitted from the ED with chills, rigors, shortness of breath, and chest pain beyond baseline. The patient reports a persistent cough that is not responding to over-the-counter cough medications. The cough is particularly bothersome when the patient is talking. The patient also mentions having a urinary infection that has since resolved. The patient is active and manages to mow his lawn despite his medical conditions.         ROS    Objective:     BP 117/71 (BP Location: Left Arm, Patient Position: Sitting, Cuff Size: Large)   Pulse 81   Temp 97.6 F (36.4 C) (Temporal)   Resp 12   Ht 5\' 9"  (1.753 m)   Wt 176 lb 1.6 oz (79.9 kg)   SpO2 97%   BMI 26.01 kg/m    Physical Exam Vitals reviewed.  Constitutional:      General: He is not in acute distress.    Appearance: Normal appearance. He is not diaphoretic.  HENT:     Head: Normocephalic and atraumatic.  Eyes:     General: No scleral icterus.     Conjunctiva/sclera: Conjunctivae normal.  Cardiovascular:     Rate and Rhythm: Normal rate and regular rhythm.     Heart sounds: Normal heart sounds. No murmur heard. Pulmonary:     Effort: Pulmonary effort is normal. No respiratory distress.     Breath sounds: Wheezing (faint) present. No rhonchi.  Musculoskeletal:     Cervical back: Neck supple.     Right lower leg: No edema.     Left lower leg: No edema.  Lymphadenopathy:     Cervical: No cervical adenopathy.  Skin:    General: Skin is warm and dry.     Findings: No rash.  Neurological:     Mental Status: He is alert and oriented to person, place, and time. Mental status is at baseline.  Psychiatric:        Mood and Affect: Mood normal.        Behavior: Behavior normal.      No results found for any visits on 10/11/22.    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 [facility])    Followed by oncology On chemotherapy and radiation Continues to have a cough, but lung sounds clear today      COPD (chronic obstructive pulmonary disease) (HCC)    No acute exacerbation Nebs prn and will send in albuterol MDI      Relevant Medications   albuterol (VENTOLIN HFA) 108 (90 Base) MCG/ACT inhaler     Endocrine   Diabetes mellitus (HCC)    Well controlled with last A1c - recheck today Stopepd  metformin previously - on no meds now UTD on vaccines, eye exam (ROI sent), foot exam On ACEi/ARB On Statin Discussed diet and exercise F/u in 6 months       Relevant Orders   Urine Microalbumin w/creat. ratio   Hemoglobin A1c   Hyperlipidemia associated with type 2 diabetes mellitus (HCC)    Previously well controlled Continue statin Repeat FLP and CMP Goal LDL < 70       Relevant Orders   Lipid panel   Comprehensive metabolic panel     Other   Chest pain    Resolved after hospitalization Has f/u scheduled with cardiology       Assessment and Plan    Chronic Cough Persistent cough despite over-the-counter cough suppressants and nebulizer use. Patient has a history of lung cancer treated with radiation, leading to chronic lung damage and cough. Codeine-based cough suppressants not an option due to patient's intolerance. -Prescribe inhaler for use as needed, particularly when patient is out and about.  Diabetes Mellitus No acute issues reported. Regular monitoring and management ongoing. -Order A1c and other routine labs today. -Collect urine sample for routine diabetes monitoring.  Hyperlipidemia No acute issues reported. Patient is on statin therapy. -Check lipid panel today.  CAD status post right ICA stent placement Recent hospitalization for chest pain, but cardiac workup was non-acute. Advised to follow up with cardiologist. -Continue Plavix, statin, and Lopressor as prescribed. -Ensure follow-up with cardiologist on 10/19/2022.  General Health Maintenance / Followup Plans -Schedule follow-up appointment in six months. -Check feet today for diabetic foot exam.       Total time spent on today's visit was greater than 40 minutes, including both face-to-face time and nonface-to-face time personally spent on review of chart (labs and imaging), discussing labs and goals, discussing further work-up, treatment options, answering patient's questions, and  coordinating care.   Return in about 6 months (around 04/13/2023) for CPE.    Shirlee Latch, MD

## 2022-10-11 NOTE — Assessment & Plan Note (Signed)
Chronic and well controlled Reviewed last metabolic panel Continue current medications - no changes today

## 2022-10-11 NOTE — Assessment & Plan Note (Signed)
Followed by oncology On chemotherapy and radiation Continues to have a cough, but lung sounds clear today

## 2022-10-11 NOTE — Assessment & Plan Note (Signed)
Well controlled with last A1c - recheck today Stopepd metformin previously - on no meds now UTD on vaccines, eye exam (ROI sent), foot exam On ACEi/ARB On Statin Discussed diet and exercise F/u in 6 months

## 2022-10-12 ENCOUNTER — Other Ambulatory Visit: Payer: Self-pay | Admitting: Family Medicine

## 2022-10-12 DIAGNOSIS — E1159 Type 2 diabetes mellitus with other circulatory complications: Secondary | ICD-10-CM

## 2022-10-16 ENCOUNTER — Inpatient Hospital Stay: Payer: Medicare Other | Attending: Oncology

## 2022-10-16 DIAGNOSIS — R600 Localized edema: Secondary | ICD-10-CM | POA: Diagnosis not present

## 2022-10-16 DIAGNOSIS — Z452 Encounter for adjustment and management of vascular access device: Secondary | ICD-10-CM | POA: Insufficient documentation

## 2022-10-16 DIAGNOSIS — I6523 Occlusion and stenosis of bilateral carotid arteries: Secondary | ICD-10-CM | POA: Diagnosis not present

## 2022-10-16 DIAGNOSIS — Z85118 Personal history of other malignant neoplasm of bronchus and lung: Secondary | ICD-10-CM | POA: Insufficient documentation

## 2022-10-16 DIAGNOSIS — E119 Type 2 diabetes mellitus without complications: Secondary | ICD-10-CM | POA: Diagnosis not present

## 2022-10-16 DIAGNOSIS — Z95828 Presence of other vascular implants and grafts: Secondary | ICD-10-CM

## 2022-10-16 DIAGNOSIS — J449 Chronic obstructive pulmonary disease, unspecified: Secondary | ICD-10-CM | POA: Diagnosis not present

## 2022-10-16 DIAGNOSIS — I2510 Atherosclerotic heart disease of native coronary artery without angina pectoris: Secondary | ICD-10-CM | POA: Diagnosis not present

## 2022-10-16 DIAGNOSIS — I10 Essential (primary) hypertension: Secondary | ICD-10-CM | POA: Diagnosis not present

## 2022-10-16 DIAGNOSIS — E782 Mixed hyperlipidemia: Secondary | ICD-10-CM | POA: Diagnosis not present

## 2022-10-16 DIAGNOSIS — Z955 Presence of coronary angioplasty implant and graft: Secondary | ICD-10-CM | POA: Diagnosis not present

## 2022-10-16 MED ORDER — HEPARIN SOD (PORK) LOCK FLUSH 100 UNIT/ML IV SOLN
500.0000 [IU] | Freq: Once | INTRAVENOUS | Status: AC
  Administered 2022-10-16: 500 [IU] via INTRAVENOUS
  Filled 2022-10-16: qty 5

## 2022-10-16 MED ORDER — SODIUM CHLORIDE 0.9% FLUSH
10.0000 mL | Freq: Once | INTRAVENOUS | Status: AC
  Administered 2022-10-16: 10 mL via INTRAVENOUS
  Filled 2022-10-16: qty 10

## 2022-11-16 DIAGNOSIS — J449 Chronic obstructive pulmonary disease, unspecified: Secondary | ICD-10-CM | POA: Diagnosis not present

## 2022-11-16 DIAGNOSIS — R0602 Shortness of breath: Secondary | ICD-10-CM | POA: Diagnosis not present

## 2022-11-21 ENCOUNTER — Other Ambulatory Visit: Payer: Self-pay | Admitting: Family Medicine

## 2022-11-21 DIAGNOSIS — I152 Hypertension secondary to endocrine disorders: Secondary | ICD-10-CM

## 2022-11-22 NOTE — Telephone Encounter (Signed)
Requested medication (s) are due for refill today: No  Requested medication (s) are on the active medication list: Yes  Last refill:  10/15/22  Future visit scheduled:   Notes to clinic:  Pharmacy requests 100 day supply    Requested Prescriptions  Pending Prescriptions Disp Refills   ramipril (ALTACE) 2.5 MG capsule [Pharmacy Med Name: RAMIPRIL 2.5MG  CAPSULE] 60 capsule 3    Sig: TAKE 1 CAPSULE BY MOUTH TWICE DAILY     Cardiovascular:  ACE Inhibitors Failed - 11/21/2022  2:48 PM      Failed - Cr in normal range and within 180 days    Creatinine  Date Value Ref Range Status  10/02/2011 1.88 (H) 0.60 - 1.30 mg/dL Final   Creatinine, Ser  Date Value Ref Range Status  10/11/2022 1.36 (H) 0.76 - 1.27 mg/dL Final         Passed - K in normal range and within 180 days    Potassium  Date Value Ref Range Status  10/11/2022 4.1 3.5 - 5.2 mmol/L Final  10/02/2011 4.2 3.5 - 5.1 mmol/L Final         Passed - Patient is not pregnant      Passed - Last BP in normal range    BP Readings from Last 1 Encounters:  10/11/22 117/71         Passed - Valid encounter within last 6 months    Recent Outpatient Visits           1 month ago Coronary artery disease involving native coronary artery of native heart without angina pectoris   [place] [facility] [place], Marzella Schlein, MD   2 months ago Other sinusitis, unspecified chronicity   [place] [facility] [place], [place], PA-C   4 months ago Hypertension associated with diabetes [facility])   [place] [facility] [name], MD   5 months ago Hypertension associated with diabetes [facility])   [place] [facility] [name], MD   5 months ago Hypertension associated with diabetes [facility])   [place] [facility] Alfredia Ferguson, PA-C       Future Appointments             In 3 months [name], Verna Czech, MD [facility] [place]    In 5 months Bacigalupo, Marzella Schlein, MD [facility]

## 2022-11-23 DIAGNOSIS — M75102 Unspecified rotator cuff tear or rupture of left shoulder, not specified as traumatic: Secondary | ICD-10-CM | POA: Diagnosis not present

## 2022-11-28 ENCOUNTER — Ambulatory Visit: Payer: Self-pay | Admitting: *Deleted

## 2022-11-28 NOTE — Telephone Encounter (Signed)
  Chief Complaint: Cough Symptoms: Dry cough, severe spells during day, awake at night. Nebs treatment "Helps some."  SOB with exertion, coughing Frequency: 3-4 days, worsening Pertinent Negatives: Patient denies CP, wheezing Disposition: [] ED /[] Urgent Care (no appt availability in office) / [x] Appointment(In office/virtual)/ []  [place] [facility]/ [] Home Care/ [] Refused Recommended Disposition /[] [place] [facility]/ []  Follow-up with PCP Additional Notes: Secured appt for Friday, first available, placed on wait list. With pt's H/O, can he possibly be seen sooner? CAre advise provided, verbalizes understanding. After hours call. Reason for Disposition  SEVERE coughing spells (e.g., whooping sound after coughing, vomiting after coughing)  Answer Assessment - Initial Assessment Questions 1. ONSET: "When did the cough begin?"      2-3 days ago, worsening 2. SEVERITY: "How bad is the cough today?"      Severe 3. SPUTUM: "Describe the color of your sputum" (none, dry cough; clear, white, yellow, green)     dry 4. HEMOPTYSIS: "Are you coughing up any blood?" If so ask: "How much?" (flecks, streaks, tablespoons, etc.)     No 5. DIFFICULTY BREATHING: "Are you having difficulty breathing?" If Yes, ask: "How bad is it?" (e.g., mild, moderate, severe)    - MILD: No SOB at rest, mild SOB with walking, speaks normally in sentences, can lie down, no retractions, pulse < 100.    - MODERATE: SOB at rest, SOB with minimal exertion and prefers to sit, cannot lie down flat, speaks in phrases, mild retractions, audible wheezing, pulse 100-120.    - SEVERE: Very SOB at rest, speaks in single words, struggling to breathe, sitting hunched forward, retractions, pulse > 120      SOB with exertion and coughing 6. FEVER: "Do you have a fever?" If Yes, ask: "What is your temperature, how was it measured, and when did it start?"     no 7. CARDIAC HISTORY: "Do you have any history of heart disease?" (e.g.,  heart attack, congestive heart failure)      *No Answer* 8. LUNG HISTORY: "Do you have any history of lung disease?"  (e.g., pulmonary embolus, asthma, emphysema)     *No Answer* 9. PE RISK FACTORS: "Do you have a history of blood clots?" (or: recent major surgery, recent prolonged travel, bedridden)     *No Answer* 10. OTHER SYMPTOMS: "Do you have any other symptoms?" (e.g., runny nose, wheezing, chest pain)       no  Protocols used: Cough - Acute Non-Productive-A-AH

## 2022-11-30 ENCOUNTER — Encounter: Payer: Self-pay | Admitting: Family Medicine

## 2022-11-30 ENCOUNTER — Ambulatory Visit (INDEPENDENT_AMBULATORY_CARE_PROVIDER_SITE_OTHER): Payer: Medicare Other | Admitting: Family Medicine

## 2022-11-30 VITALS — BP 151/88 | HR 80 | Ht 69.0 in | Wt 171.6 lb

## 2022-11-30 DIAGNOSIS — U071 COVID-19: Secondary | ICD-10-CM | POA: Diagnosis not present

## 2022-11-30 DIAGNOSIS — R059 Cough, unspecified: Secondary | ICD-10-CM | POA: Diagnosis not present

## 2022-11-30 MED ORDER — NIRMATRELVIR/RITONAVIR (PAXLOVID) TABLET (RENAL DOSING)
2.0000 | ORAL_TABLET | Freq: Two times a day (BID) | ORAL | 0 refills | Status: AC
Start: 2022-11-30 — End: 2022-12-05

## 2022-11-30 MED ORDER — GUAIFENESIN-DM 100-10 MG/5ML PO SYRP: 5.0000 mL | ORAL_SOLUTION | ORAL | 0 refills | Status: DC | PRN

## 2022-11-30 NOTE — Progress Notes (Signed)
Established patient visit   Patient: Luke Rojas   DOB: 01/10/1946   77 y.o. Male  MRN: [id number] Visit Date: 11/30/2022  Today's healthcare provider: Jacky Kindle, FNP  Introduced to nurse practitioner role and practice setting.  All questions answered.  Discussed provider/patient relationship and expectations.  Subjective    HPI HPI     Medical Management of Chronic Issues    Additional comments: Cough that has been going on for several days, headaches, congestion, trouble breathing       Last edited by Rolly Salter, CMA on 11/30/2022  4:33 PM.      Medications: Outpatient Medications Prior to Visit  Medication Sig   albuterol (PROVENTIL) (2.5 MG/3ML) 0.083% nebulizer solution Take 3 mLs (2.5 mg total) by nebulization every 6 (six) hours as needed for wheezing or shortness of breath.   albuterol (VENTOLIN HFA) 108 (90 Base) MCG/ACT inhaler Inhale 2 puffs into the lungs every 6 (six) hours as needed for wheezing or shortness of breath.   atorvastatin (LIPITOR) 40 MG tablet Take 1 tablet (40 mg total) by mouth daily.   cetirizine (ZYRTEC) 10 MG tablet Take 1/2 tablet daily   clopidogrel (PLAVIX) 75 MG tablet Take 75 mg by mouth daily.   co-enzyme Q-10 30 MG capsule Take 30 mg by mouth daily.   Cranberry-Vitamin C-Probiotic (AZO CRANBERRY PO) Take 2 tablets by mouth daily at 12 noon.   diclofenac sodium (VOLTAREN) 1 % GEL Apply 2 g topically 4 (four) times daily.   guaiFENesin-dextromethorphan (ROBITUSSIN DM) 100-10 MG/5ML syrup Take 5 mLs by mouth every 4 (four) hours as needed for cough.   metoprolol tartrate (LOPRESSOR) 25 MG tablet Take 1 tablet (25 mg total) by mouth 2 (two) times daily.   MULTIPLE VITAMIN PO Take 1 tablet by mouth daily.   nitroGLYCERIN (NITROSTAT) 0.4 MG SL tablet Place under the tongue.   Omega-3 Fatty Acids (FISH OIL) 1000 MG CAPS Take by mouth.   OneTouch Delica Lancets 33G MISC TEST FASTING GLUCOSE LEVEL EACH MORNING BEFORE BREAKFAST    pantoprazole (PROTONIX) 40 MG tablet Take 40 mg by mouth daily.   ramipril (ALTACE) 2.5 MG capsule TAKE 1 CAPSULE BY MOUTH TWICE DAILY   tamsulosin (FLOMAX) 0.4 MG CAPS capsule Take 0.4 mg by mouth daily.   No facility-administered medications prior to visit.     Objective    BP (!) 151/88 (BP Location: Left Arm, Patient Position: Sitting, Cuff Size: Large)   Pulse 80   Ht 5\' 9"  (1.753 m)   Wt 171 lb 9.6 oz (77.8 kg)   BMI 25.34 kg/m   Physical Exam Vitals and nursing note reviewed.  Constitutional:      Appearance: Normal appearance. He is normal weight. He is ill-appearing.  HENT:     Head: Normocephalic and atraumatic.  Cardiovascular:     Rate and Rhythm: Normal rate and regular rhythm.     Pulses: Normal pulses.     Heart sounds: Normal heart sounds.  Pulmonary:     Effort: Pulmonary effort is normal.     Breath sounds: Normal breath sounds.  Musculoskeletal:        General: Normal range of motion.     Cervical back: Normal range of motion.  Skin:    General: Skin is warm and dry.     Capillary Refill: Capillary refill takes less than 2 seconds.  Neurological:     General: No focal deficit present.     Mental Status:  He is alert and oriented to person, place, and time. Mental status is at baseline.  Psychiatric:        Mood and Affect: Mood normal.        Behavior: Behavior normal.        Thought Content: Thought content normal.        Judgment: Judgment normal.     No results found for any visits on 11/30/22.  Assessment & Plan     Problem List Items Addressed This Visit       Other   Cough   Relevant Orders   Temperature monitoring   COVID-19 - Primary    Acute, recommend treatment given chronic conditions [place] supportive measures Recommend ED if worsening given concern for known previous lung damage       Relevant Medications   nirmatrelvir/ritonavir, renal dosing, (PAXLOVID) 10 x 150 MG & 10 x 100MG  TABS   Other Relevant Orders    Temperature monitoring   Return if symptoms worsen or fail to improve.     Leilani Merl, FNP, have reviewed all documentation for this visit. The documentation on 11/30/22 for the exam, diagnosis, procedures, and orders are all accurate and complete.  Jacky Kindle, FNP  [facility] [phone] (phone) [phone] (fax)  [facility]

## 2022-11-30 NOTE — Patient Instructions (Signed)
Your symptoms and exam findings are most consistent with a viral upper respiratory infection. These usually run their course in 5-7 days. Unfortunately, antibiotics don't work against viruses and just increase your risk of other issues such as diarrhea, yeast infections, and resistant infections.  If you start feeling worse with facial pain, high fever, cough, shortness of breath or start feeling significantly worse, please call us right away to be further evaluated.  Some things that can make you feel better are: - Increased rest - Increasing Fluids - Acetaminophen / ibuprofen as needed for fever/pain.  - Salt water gargling, chloraseptic spray and throat lozenges - OTC pseudoephedrine.  - Mucinex.  - Saline sinus flushes or a neti pot.  - Humidifying the air.  

## 2022-11-30 NOTE — Assessment & Plan Note (Signed)
Acute, recommend treatment given chronic conditions [place] supportive measures Recommend ED if worsening given concern for known previous lung damage

## 2022-12-18 DIAGNOSIS — M75102 Unspecified rotator cuff tear or rupture of left shoulder, not specified as traumatic: Secondary | ICD-10-CM | POA: Diagnosis not present

## 2023-01-15 ENCOUNTER — Ambulatory Visit (INDEPENDENT_AMBULATORY_CARE_PROVIDER_SITE_OTHER): Payer: Medicare Other

## 2023-01-15 ENCOUNTER — Ambulatory Visit (INDEPENDENT_AMBULATORY_CARE_PROVIDER_SITE_OTHER): Payer: Medicare Other | Admitting: Vascular Surgery

## 2023-01-15 ENCOUNTER — Encounter (INDEPENDENT_AMBULATORY_CARE_PROVIDER_SITE_OTHER): Payer: Self-pay | Admitting: Vascular Surgery

## 2023-01-15 VITALS — BP 144/77 | HR 76 | Resp 16 | Ht 68.0 in | Wt 172.0 lb

## 2023-01-15 DIAGNOSIS — I152 Hypertension secondary to endocrine disorders: Secondary | ICD-10-CM | POA: Diagnosis not present

## 2023-01-15 DIAGNOSIS — I6521 Occlusion and stenosis of right carotid artery: Secondary | ICD-10-CM | POA: Diagnosis not present

## 2023-01-15 DIAGNOSIS — E785 Hyperlipidemia, unspecified: Secondary | ICD-10-CM | POA: Diagnosis not present

## 2023-01-15 DIAGNOSIS — E1159 Type 2 diabetes mellitus with other circulatory complications: Secondary | ICD-10-CM

## 2023-01-15 DIAGNOSIS — E1169 Type 2 diabetes mellitus with other specified complication: Secondary | ICD-10-CM

## 2023-01-15 DIAGNOSIS — C801 Malignant (primary) neoplasm, unspecified: Secondary | ICD-10-CM | POA: Diagnosis not present

## 2023-01-15 DIAGNOSIS — C7801 Secondary malignant neoplasm of right lung: Secondary | ICD-10-CM | POA: Diagnosis not present

## 2023-01-15 NOTE — Assessment & Plan Note (Signed)
His carotid duplex today reveals a widely patent right carotid stent and 1 to 39% left ICA stenosis.  Doing well.  Continue current medical regimen including Plavix and Lipitor.  Follow-up in 1 year with carotid duplex.

## 2023-01-15 NOTE — Progress Notes (Signed)
MRN : [id number]  Luke Rojas is a 77 y.o. (08-17-1945) male who presents with chief complaint of  Chief Complaint  Patient presents with   Follow-up    ultrasound  .  History of Present Illness: Patient returns in follow-up of his carotid disease as well as his previous Port-A-Cath placement.  He is doing fairly well today.  He has completed his chemotherapy and currently is getting blood draws and CT scans through his port but is not actively being used for chemotherapy.  He asks me about removing this today.  As for his carotid disease, he denies any focal neurologic symptoms. Specifically, the patient denies amaurosis fugax, speech or swallowing difficulties, or arm or leg weakness or numbness.  He is a little over a year status post right carotid stent placement for high-grade stenosis with previous neurologic symptoms.  His carotid duplex today reveals a widely patent right carotid stent and 1 to 39% left ICA stenosis.  Current Outpatient Medications  Medication Sig Dispense Refill   albuterol (PROVENTIL) (2.5 MG/3ML) 0.083% nebulizer solution Take 3 mLs (2.5 mg total) by nebulization every 6 (six) hours as needed for wheezing or shortness of breath. 150 mL 1   albuterol (VENTOLIN HFA) 108 (90 Base) MCG/ACT inhaler Inhale 2 puffs into the lungs every 6 (six) hours as needed for wheezing or shortness of breath. 6.7 g 5   atorvastatin (LIPITOR) 40 MG tablet Take 1 tablet (40 mg total) by mouth daily. 90 tablet 3   cetirizine (ZYRTEC) 10 MG tablet Take 1/2 tablet daily 30 tablet 11   clopidogrel (PLAVIX) 75 MG tablet Take 75 mg by mouth daily.     co-enzyme Q-10 30 MG capsule Take 30 mg by mouth daily.     Cranberry-Vitamin C-Probiotic (AZO CRANBERRY PO) Take 2 tablets by mouth daily at 12 noon.     diclofenac sodium (VOLTAREN) 1 % GEL Apply 2 g topically 4 (four) times daily. 100 g 3   guaiFENesin-dextromethorphan (ROBITUSSIN DM) 100-10 MG/5ML syrup Take 5 mLs by mouth every 4  (four) hours as needed for cough. 118 mL 0   guaiFENesin-dextromethorphan (ROBITUSSIN DM) 100-10 MG/5ML syrup Take 5 mLs by mouth every 4 (four) hours as needed for cough. 236 mL 0   metoprolol tartrate (LOPRESSOR) 25 MG tablet Take 1 tablet (25 mg total) by mouth 2 (two) times daily. 60 tablet 2   MULTIPLE VITAMIN PO Take 1 tablet by mouth daily.     nitroGLYCERIN (NITROSTAT) 0.4 MG SL tablet Place under the tongue.     Omega-3 Fatty Acids (FISH OIL) 1000 MG CAPS Take by mouth.     OneTouch Delica Lancets 33G MISC TEST FASTING GLUCOSE LEVEL EACH MORNING BEFORE BREAKFAST 100 each 4   pantoprazole (PROTONIX) 40 MG tablet Take 40 mg by mouth daily.     ramipril (ALTACE) 2.5 MG capsule TAKE 1 CAPSULE BY MOUTH TWICE DAILY 60 capsule 3   tamsulosin (FLOMAX) 0.4 MG CAPS capsule Take 0.4 mg by mouth daily.     No current facility-administered medications for this visit.    Past Medical History:  Diagnosis Date   Arthritis    OSTEOARTHRITIS   Chronic kidney disease    kidney stones   COPD (chronic obstructive pulmonary disease) (HCC)    Coronary artery disease    2 stents    Diabetes mellitus without complication (HCC)    GERD (gastroesophageal reflux disease)    Hypercholesteremia    Hypertension    Lung  cancer [facility])    Myocardial infarction [facility]) 2013   Renal colic     Past Surgical History:  Procedure Laterality Date   CARDIAC CATHETERIZATION  2013   2 stents   CAROTID PTA/STENT INTERVENTION Right 09/11/2021   Procedure: CAROTID PTA/STENT INTERVENTION;  Surgeon: [name], MD;  Location: [facility] INV[facility]IVE [facility];  Service: Cardiovascular;  Laterality: Right;   CATARACT EXTRACTION, BILATERAL     COLONOSCOPY  04/27/05   COLONOSCOPY WITH PROPOFOL N/A 06/08/2015   Procedure: COLONOSCOPY WITH PROPOFOL;  Surgeon: [name], MD;  Location: [facility] ENDOSCOPY;  Service: Endoscopy;  Laterality: N/A;   CYSTOSCOPY WITH INSERTION OF UROLIFT N/A 02/11/2020   Procedure: CYSTOSCOPY WITH  INSERTION OF UROLIFT;  Surgeon: [name], MD;  Location: [facility] ORS;  Service: Urology;  Laterality: N/A;   CYSTOSCOPY WITH STENT PLACEMENT Right 09/16/2016   Procedure: CYSTOSCOPY WITH STENT PLACEMENT;  Surgeon: [name], MD;  Location: [facility] ORS;  Service: Urology;  Laterality: Right;   EXTRACORPOREAL SHOCK WAVE LITHOTRIPSY Left 11/04/2014   has had 2 previous lithotripsies   EXTRACORPOREAL SHOCK WAVE LITHOTRIPSY Left 03/24/2015   Procedure: EXTRACORPOREAL SHOCK WAVE LITHOTRIPSY (ESWL);  Surgeon: [name], MD;  Location: [facility] ORS;  Service: Urology;  Laterality: Left;   EXTRACORPOREAL SHOCK WAVE LITHOTRIPSY Right 10/04/2016   Procedure: EXTRACORPOREAL SHOCK WAVE LITHOTRIPSY (ESWL);  Surgeon: [name], MD;  Location: [facility] ORS;  Service: Urology;  Laterality: Right;   LEFT HEART CATH AND CORONARY ANGIOGRAPHY N/A 12/05/2018   Procedure: LEFT HEART CATH AND CORONARY ANGIOGRAPHY with possible pci and stent;  Surgeon: [name], MD;  Location: [facility];  Service: Cardiovascular;  Laterality: N/A;   PORTA CATH INSERTION Right 09/25/2021   Procedure: PORTA CATH INSERTION;  Surgeon: [name], MD;  Location: [facility];  Service: Cardiovascular;  Laterality: Right;     Social History   Tobacco Use   Smoking status: Former    Current packs/day: 0.00    Average packs/day: 1 pack/day for 30.0 years (30.0 ttl pk-yrs)    Types: Cigarettes    Start date: 03/05/1954    Quit date: 03/05/1984    Years since quitting: 38.8   Smokeless tobacco: Current    Types: Chew   Tobacco comments:    Chews tobacco daily.  Vaping Use   Vaping status: Never Used  Substance Use Topics   Alcohol use: No    Alcohol/week: 0.0 standard drinks of alcohol   Drug use: No      Family History  Problem Relation Age of Onset   Pulmonary embolism Mother    Transient ischemic attack Mother    Diabetes Mother    Pancreatic cancer Father    Hypertension Father     Diabetes Father    Cirrhosis Brother      Allergies  Allergen Reactions   Codeine Nausea Only, Nausea And Vomiting and Other (See Comments)    Other reaction(s): Vomiting     REVIEW OF SYSTEMS (Negative unless checked)   Constitutional: [] Weight loss  [] Fever  [] Chills Cardiac: [] Chest pain   [] Chest pressure   [] Palpitations   [] Shortness of breath when laying flat   [] Shortness of breath at rest   [x] Shortness of breath with exertion. Vascular:  [] Pain in legs with walking   [] Pain in legs at rest   [] Pain in legs when laying flat   [] Claudication   [] Pain in feet when walking  [] Pain in feet at  rest  [] Pain in feet when laying flat   [] History of DVT   [] Phlebitis   [] Swelling in legs   [] Varicose veins   [] Non-healing ulcers Pulmonary:   [] Uses home oxygen   [x] Productive cough   [] Hemoptysis   [] Wheeze  [x] COPD   [] Asthma Neurologic:  [] Dizziness  [] Blackouts   [] Seizures   [] History of stroke   [] History of TIA  [] Aphasia   [] Temporary blindness   [] Dysphagia   [] Weakness or numbness in arms   [] Weakness or numbness in legs Musculoskeletal:  [x] Arthritis   [] Joint swelling   [x] Joint pain   [] Low back pain Hematologic:  [x] Easy bruising  [] Easy bleeding   [] Hypercoagulable state   [] Anemic  [] Hepatitis Gastrointestinal:  [] Blood in stool   [] Vomiting blood  [] Gastroesophageal reflux/heartburn   [] Abdominal pain Genitourinary:  [] Chronic kidney disease   [] Difficult urination  [] Frequent urination  [] Burning with urination   [] Hematuria Skin:  [] Rashes   [] Ulcers   [] Wounds Psychological:  [] History of anxiety   []  History of major depression.    Physical Examination  Vitals:   01/15/23 0853 01/15/23 0854  BP: 128/74 (!) 144/77  Pulse: 76   Resp: 16   Weight: 172 lb (78 kg)   Height: 5\' 8"  (1.727 m)    Body mass index is 26.15 kg/m. Gen:  WD/WN, NAD Head: [place]/AT, No temporalis wasting. Ear/Nose/Throat: Hearing grossly intact, nares w/o erythema or drainage, trachea  midline Eyes: Conjunctiva clear. Sclera non-icteric Neck: Supple.  No bruit  Pulmonary:  Good air movement, equal and clear to auscultation bilaterally.  Cardiac: RRR, No JVD.  Port in place in the right chest without erythema or drainage. Vascular:  Vessel Right Left  Radial Palpable Palpable           Musculoskeletal: M/S 5/5 throughout.  No deformity or atrophy. No edema. Neurologic: CN 2-12 intact. Sensation grossly intact in extremities.  Symmetrical.  Speech is fluent. Motor exam as listed above. Psychiatric: Judgment intact, Mood & affect appropriate for pt's clinical situation. Dermatologic: No rashes or ulcers noted.  No cellulitis or open wounds.    CBC Lab Results  Component Value Date   WBC 9.1 10/08/2022   HGB 13.2 10/08/2022   HCT 39.1 10/08/2022   MCV 88.1 10/08/2022   PLT 156 10/08/2022    BMET    Component Value Date/Time   NA 139 10/11/2022 1055   NA 139 10/02/2011 1051   K 4.1 10/11/2022 1055   K 4.2 10/02/2011 1051   CL 103 10/11/2022 1055   CL 106 10/02/2011 1051   CO2 20 10/11/2022 1055   CO2 27 10/02/2011 1051   GLUCOSE 107 (H) 10/11/2022 1055   GLUCOSE 153 (H) 10/08/2022 1454   GLUCOSE 97 10/02/2011 1051   BUN 16 10/11/2022 1055   BUN 21 (H) 10/02/2011 1051   CREATININE 1.36 (H) 10/11/2022 1055   CREATININE 1.88 (H) 10/02/2011 1051   CALCIUM 9.1 10/11/2022 1055   CALCIUM 8.4 (L) 10/02/2011 1051   GFRNONAA >60 10/08/2022 2341   GFRNONAA 36 (L) 10/02/2011 1051   GFRAA 60 12/04/2019 0810   GFRAA 42 (L) 10/02/2011 1051   CrCl cannot be calculated (Patient's most recent lab result is older than the maximum 21 days allowed.).  COAG Lab Results  Component Value Date   INR 1.2 10/09/2022   INR 1.2 10/08/2022   INR 1.1 10/15/2021    Radiology No results found.   Assessment/Plan Carotid stenosis, right His carotid duplex today reveals a  widely patent right carotid stent and 1 to 39% left ICA stenosis.  Doing well.  Continue current  medical regimen including Plavix and Lipitor.  Follow-up in 1 year with carotid duplex.  Hypertension associated with diabetes (HCC) blood pressure control important in reducing the progression of atherosclerotic disease. On appropriate oral medications.     T2DM (type 2 diabetes mellitus) (HCC) blood glucose control important in reducing the progression of atherosclerotic disease. Also, involved in wound healing. On appropriate medications.     Hyperlipidemia associated with type 2 diabetes mellitus (HCC) lipid control important in reducing the progression of atherosclerotic disease. Continue statin therapy   Malignant neoplasm of upper lobe of right lung [facility]) Port in place and has gotten treatments. Follows with oncology.  He asks about having his port removed and I said I am happy to remove it but I would defer to his oncologist first to make sure that it would be okay to remove this.  He actually is slated to see her in the next few weeks and I will wait to hear back from him on their discussion.  Festus Barren, MD  01/15/2023 11:34 AM    This note was created with Dragon medical transcription system.  Any errors from dictation are purely unintentional

## 2023-01-16 ENCOUNTER — Other Ambulatory Visit: Payer: Medicare Other

## 2023-01-16 ENCOUNTER — Ambulatory Visit: Payer: Medicare Other | Admitting: Oncology

## 2023-01-17 ENCOUNTER — Ambulatory Visit
Admission: RE | Admit: 2023-01-17 | Discharge: 2023-01-17 | Disposition: A | Discharge status: Home / Self Care | Payer: Medicare Other | Source: Ambulatory Visit | Attending: Oncology | Admitting: Oncology

## 2023-01-17 DIAGNOSIS — Z85118 Personal history of other malignant neoplasm of bronchus and lung: Secondary | ICD-10-CM | POA: Insufficient documentation

## 2023-01-17 DIAGNOSIS — J439 Emphysema, unspecified: Secondary | ICD-10-CM | POA: Diagnosis not present

## 2023-01-17 DIAGNOSIS — N281 Cyst of kidney, acquired: Secondary | ICD-10-CM | POA: Diagnosis not present

## 2023-01-17 DIAGNOSIS — Z08 Encounter for follow-up examination after completed treatment for malignant neoplasm: Secondary | ICD-10-CM | POA: Diagnosis not present

## 2023-01-17 DIAGNOSIS — C3411 Malignant neoplasm of upper lobe, right bronchus or lung: Secondary | ICD-10-CM | POA: Insufficient documentation

## 2023-01-17 LAB — POCT I-STAT CREATININE: Creatinine, Ser: 1.2 mg/dL (ref 0.61–1.24)

## 2023-01-17 MED ORDER — IOHEXOL 300 MG/ML  SOLN
85.0000 mL | Freq: Once | INTRAMUSCULAR | Status: AC | PRN
  Administered 2023-01-17: 85 mL via INTRAVENOUS

## 2023-01-22 ENCOUNTER — Inpatient Hospital Stay: Payer: Medicare Other | Admitting: Oncology

## 2023-01-22 ENCOUNTER — Inpatient Hospital Stay: Payer: Medicare Other | Attending: Oncology

## 2023-01-22 ENCOUNTER — Encounter: Payer: Self-pay | Admitting: Oncology

## 2023-01-22 VITALS — BP 134/80 | HR 67 | Temp 97.0°F | Resp 18 | Wt 173.0 lb

## 2023-01-22 DIAGNOSIS — Z85118 Personal history of other malignant neoplasm of bronchus and lung: Secondary | ICD-10-CM | POA: Insufficient documentation

## 2023-01-22 DIAGNOSIS — Z08 Encounter for follow-up examination after completed treatment for malignant neoplasm: Secondary | ICD-10-CM | POA: Diagnosis not present

## 2023-01-22 DIAGNOSIS — D649 Anemia, unspecified: Secondary | ICD-10-CM

## 2023-01-22 DIAGNOSIS — Z452 Encounter for adjustment and management of vascular access device: Secondary | ICD-10-CM | POA: Insufficient documentation

## 2023-01-22 DIAGNOSIS — C3411 Malignant neoplasm of upper lobe, right bronchus or lung: Secondary | ICD-10-CM

## 2023-01-22 LAB — CMP (CANCER CENTER ONLY)
ALT: 26 U/L (ref 0–44)
AST: 21 U/L (ref 15–41)
Albumin: 3.7 g/dL (ref 3.5–5.0)
Alkaline Phosphatase: 75 U/L (ref 38–126)
Anion gap: 11 (ref 5–15)
BUN: 22 mg/dL (ref 8–23)
CO2: 24 mmol/L (ref 22–32)
Calcium: 8.9 mg/dL (ref 8.9–10.3)
Chloride: 102 mmol/L (ref 98–111)
Creatinine: 1.23 mg/dL (ref 0.61–1.24)
GFR, Estimated: >60 mL/min (ref >60)
Glucose, Bld: 103 mg/dL — ABNORMAL HIGH (ref 70–99)
Potassium: 4.3 mmol/L (ref 3.5–5.1)
Sodium: 137 mmol/L (ref 135–145)
Total Bilirubin: 0.9 mg/dL (ref <1.2)
Total Protein: 7.1 g/dL (ref 6.5–8.1)

## 2023-01-22 LAB — CBC WITH DIFFERENTIAL (CANCER CENTER ONLY)
Abs Immature Granulocytes: 0.03 10*3/uL (ref 0.00–0.07)
Basophils Absolute: 0 10*3/uL (ref 0.0–0.1)
Basophils Relative: 0 %
Eosinophils Absolute: 0.1 10*3/uL (ref 0.0–0.5)
Eosinophils Relative: 2 %
HCT: 44.1 % (ref 39.0–52.0)
Hemoglobin: 14.7 g/dL (ref 13.0–17.0)
Immature Granulocytes: 0 %
Lymphocytes Relative: 17 %
Lymphs Abs: 1.4 10*3/uL (ref 0.7–4.0)
MCH: 29.3 pg (ref 26.0–34.0)
MCHC: 33.3 g/dL (ref 30.0–36.0)
MCV: 87.8 fL (ref 80.0–100.0)
Monocytes Absolute: 0.8 10*3/uL (ref 0.1–1.0)
Monocytes Relative: 10 %
Neutro Abs: 5.7 10*3/uL (ref 1.7–7.7)
Neutrophils Relative %: 71 %
Platelet Count: 199 10*3/uL (ref 150–400)
RBC: 5.02 MIL/uL (ref 4.22–5.81)
RDW: 14.9 % (ref 11.5–15.5)
WBC Count: 8.1 10*3/uL (ref 4.0–10.5)
nRBC: 0 % (ref 0.0–0.2)

## 2023-01-22 MED ORDER — HEPARIN SOD (PORK) LOCK FLUSH 100 UNIT/ML IV SOLN
500.0000 [IU] | Freq: Once | INTRAVENOUS | Status: AC
  Administered 2023-01-22: 500 [IU] via INTRAVENOUS
  Filled 2023-01-22: qty 5

## 2023-01-27 ENCOUNTER — Encounter: Payer: Self-pay | Admitting: Oncology

## 2023-01-27 NOTE — Progress Notes (Signed)
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  He quit smoking about 30 years ago but still chews tobacco.  He presented to the ER with symptoms of neck pain which led to an MRI cervical spine as well as CT angio chest.  MRI cervical spine showed degenerative changes along with moderate spinal stenosis and mass effect between C3-C4 and C4-C5.  Foraminal stenosis at C3-C4 C5-C6-C7 nerve levels.  Nonspecific 10 mm T3 vertebral body lesion indeterminate for bone metastases.  CT angio chest showed a 4.5 x 4.9 x 3.7 cm right upper lobe peripheral mass with probable pleural involvement and potential early invasion into right middle lobe.  Borderline enlarged right hilar lymph nodes measuring up to 1.2 cm.   PET CT scan showed hypermetabolic 4.8 cm right upper lobe mass consistent with primary bronchogenic carcinoma and mild asymmetric FDG activity in the right suprahilar region suspicious for right hilar lymph node metastases.  Patient had a CT-guided right upper lobe lung biopsy which was consistent with squamous cell carcinoma.  Completed concurrent chemoradiation with weekly CarboTaxol in September 2023 and currently remains in remission  Interval  history-patient is doing well overall for his age.  Denies any specific complaints at this time.  No recent hospitalizations.  ECOG PS- 2 Pain scale- 0   Review of systems- Review of Systems  Constitutional:  Negative for chills, fever, malaise/fatigue and weight loss.  HENT:  Negative for congestion, ear discharge and nosebleeds.   Eyes:  Negative for blurred vision.  Respiratory:  Negative for cough, hemoptysis, sputum production, shortness of breath and wheezing.   Cardiovascular:  Negative for chest pain, palpitations, orthopnea and claudication.  Gastrointestinal:  Negative for abdominal pain, blood in stool, constipation, diarrhea, heartburn, melena, nausea and vomiting.  Genitourinary:  Negative for dysuria, flank pain, frequency, hematuria and urgency.  Musculoskeletal:  Negative for back pain, joint pain and myalgias.  Skin:  Negative for rash.  Neurological:  Negative for dizziness, tingling, focal weakness, seizures, weakness and headaches.  Endo/Heme/Allergies:  Does not bruise/bleed easily.  Psychiatric/Behavioral:  Negative for depression and suicidal ideas. The patient does not have insomnia.       Allergies  Allergen Reactions   Codeine Nausea Only, Nausea And Vomiting and Other (See Comments)    Other reaction(s): Vomiting     Past Medical History:  Diagnosis Date   Arthritis    OSTEOARTHRITIS   Chronic kidney disease    kidney stones   COPD (chronic obstructive pulmonary disease) (HCC)    Coronary artery disease    2 stents    Diabetes mellitus without complication (HCC)    GERD (gastroesophageal reflux disease)    Hypercholesteremia    Hypertension    Lung cancer (HCC)    Myocardial infarction [facility]) 2013   Renal colic  Past Surgical History:  Procedure Laterality Date   CARDIAC CATHETERIZATION  2013   2 stents   CAROTID PTA/STENT INTERVENTION Right 09/11/2021   Procedure: CAROTID PTA/STENT INTERVENTION;  Surgeon: [name], MD;  Location:  [facility] INVASIVE CV LAB;  Service: Cardiovascular;  Laterality: Right;   CATARACT EXTRACTION, BILATERAL     COLONOSCOPY  04/27/05   COLONOSCOPY WITH PROPOFOL N/A 06/08/2015   Procedure: COLONOSCOPY WITH PROPOFOL;  Surgeon: [name], MD;  Location: [facility] ENDOSCOPY;  Service: Endoscopy;  Laterality: N/A;   CYSTOSCOPY WITH INSERTION OF UROLIFT N/A 02/11/2020   Procedure: CYSTOSCOPY WITH INSERTION OF UROLIFT;  Surgeon: [name], MD;  Location: [facility] ORS;  Service: Urology;  Laterality: N/A;   CYSTOSCOPY WITH STENT PLACEMENT Right 09/16/2016   Procedure: CYSTOSCOPY WITH STENT PLACEMENT;  Surgeon: [name], MD;  Location: [facility] ORS;  Service: Urology;  Laterality: Right;   EXTRACORPOREAL SHOCK WAVE LITHOTRIPSY Left 11/04/2014   has had 2 previous lithotripsies   EXTRACORPOREAL SHOCK WAVE LITHOTRIPSY Left 03/24/2015   Procedure: EXTRACORPOREAL SHOCK WAVE LITHOTRIPSY (ESWL);  Surgeon: [name], MD;  Location: [facility] ORS;  Service: Urology;  Laterality: Left;   EXTRACORPOREAL SHOCK WAVE LITHOTRIPSY Right 10/04/2016   Procedure: EXTRACORPOREAL SHOCK WAVE LITHOTRIPSY (ESWL);  Surgeon: [name], MD;  Location: [facility] ORS;  Service: Urology;  Laterality: Right;   LEFT HEART CATH AND CORONARY ANGIOGRAPHY N/A 12/05/2018   Procedure: LEFT HEART CATH AND CORONARY ANGIOGRAPHY with possible pci and stent;  Surgeon: [name], MD;  Location: [facility] CV [facility];  Service: Cardiovascular;  Laterality: N/A;   PORTA CATH INSERTION Right 09/25/2021   Procedure: PORTA CATH INSERTION;  Surgeon: [name], MD;  Location: [facility];  Service: Cardiovascular;  Laterality: Right;    Social History   Socioeconomic History   Marital status: Married    Spouse name: Not on file   Number of children: 2   Years of education: Not on file   Highest education level: 8th grade  Occupational History   Occupation: retired  Tobacco Use   Smoking status: Former    Current packs/day:  0.00    Average packs/day: 1 pack/day for 30.0 years (30.0 ttl pk-yrs)    Types: Cigarettes    Start date: 03/05/1954    Quit date: 03/05/1984    Years since quitting: 38.9   Smokeless tobacco: Current    Types: Chew   Tobacco comments:    Chews tobacco daily.  Vaping Use   Vaping status: Never Used  Substance and Sexual Activity   Alcohol use: No    Alcohol/week: 0.0 standard drinks of alcohol   Drug use: No   Sexual activity: Not Currently  Other Topics Concern   Not on file  Social History Narrative   Lives with Joyce Gross, wife, and son [place].   Social Determinants of Health   Financial Resource Strain: Low Risk  (04/16/2022)   Overall Financial Resource Strain (CARDIA)    Difficulty of Paying Living Expenses: Not hard at all  Food Insecurity: No Food Insecurity (10/09/2022)   Hunger Vital Sign    Worried About Running Out of Food in the Last Year: Never true    Ran Out of Food in the Last Year: Never true  Transportation Needs: No Transportation Needs (10/09/2022)   PRAPARE - Administrator, Civil Service (Medical): No    Lack of Transportation (Non-Medical): No  Physical Activity: Inactive (04/16/2022)   Exercise Vital  Sign    Days of Exercise per Week: 0 days    Minutes of Exercise per Session: 0 min  Stress: No Stress Concern Present (04/16/2022)   [employer] of Occupational Health - Occupational Stress Questionnaire    Feeling of Stress : Not at all  Social Connections: Moderately Integrated (04/16/2022)   Social Connection and Isolation Panel [NHANES]    Frequency of Communication with Friends and Family: More than three times a week    Frequency of Social Gatherings with Friends and Family: More than three times a week    Attends Religious Services: More than 4 times per year    Active Member of [employer] or Organizations: No    Attends Banker Meetings: Never    Marital Status: Married  Catering manager Violence: Not At Risk (10/09/2022)    Humiliation, Afraid, Rape, and Kick questionnaire    Fear of Current or Ex-Partner: No    Emotionally Abused: No    Physically Abused: No    Sexually Abused: No    Family History  Problem Relation Age of Onset   Pulmonary embolism Mother    Transient ischemic attack Mother    Diabetes Mother    Pancreatic cancer Father    Hypertension Father    Diabetes Father    Cirrhosis Brother      Current Outpatient Medications:    celecoxib (CELEBREX) 100 MG capsule, Take 100 mg by mouth daily., Disp: , Rfl:    albuterol (PROVENTIL) (2.5 MG/3ML) 0.083% nebulizer solution, Take 3 mLs (2.5 mg total) by nebulization every 6 (six) hours as needed for wheezing or shortness of breath., Disp: 150 mL, Rfl: 1   albuterol (VENTOLIN HFA) 108 (90 Base) MCG/ACT inhaler, Inhale 2 puffs into the lungs every 6 (six) hours as needed for wheezing or shortness of breath., Disp: 6.7 g, Rfl: 5   atorvastatin (LIPITOR) 40 MG tablet, Take 1 tablet (40 mg total) by mouth daily., Disp: 90 tablet, Rfl: 3   cetirizine (ZYRTEC) 10 MG tablet, Take 1/2 tablet daily, Disp: 30 tablet, Rfl: 11   clopidogrel (PLAVIX) 75 MG tablet, Take 75 mg by mouth daily., Disp: , Rfl:    co-enzyme Q-10 30 MG capsule, Take 30 mg by mouth daily., Disp: , Rfl:    Cranberry-Vitamin C-Probiotic (AZO CRANBERRY PO), Take 2 tablets by mouth daily at 12 noon., Disp: , Rfl:    diclofenac sodium (VOLTAREN) 1 % GEL, Apply 2 g topically 4 (four) times daily., Disp: 100 g, Rfl: 3   guaiFENesin-dextromethorphan (ROBITUSSIN DM) 100-10 MG/5ML syrup, Take 5 mLs by mouth every 4 (four) hours as needed for cough., Disp: 118 mL, Rfl: 0   guaiFENesin-dextromethorphan (ROBITUSSIN DM) 100-10 MG/5ML syrup, Take 5 mLs by mouth every 4 (four) hours as needed for cough., Disp: 236 mL, Rfl: 0   metoprolol tartrate (LOPRESSOR) 25 MG tablet, Take 1 tablet (25 mg total) by mouth 2 (two) times daily., Disp: 60 tablet, Rfl: 2   MULTIPLE VITAMIN PO, Take 1 tablet by mouth  daily., Disp: , Rfl:    nitroGLYCERIN (NITROSTAT) 0.4 MG SL tablet, Place under the tongue., Disp: , Rfl:    Omega-3 Fatty Acids (FISH OIL) 1000 MG CAPS, Take by mouth., Disp: , Rfl:    OneTouch Delica Lancets 33G MISC, TEST FASTING GLUCOSE LEVEL EACH MORNING BEFORE BREAKFAST, Disp: 100 each, Rfl: 4   pantoprazole (PROTONIX) 40 MG tablet, Take 40 mg by mouth daily., Disp: , Rfl:    ramipril (ALTACE) 2.5 MG  capsule, TAKE 1 CAPSULE BY MOUTH TWICE DAILY, Disp: 60 capsule, Rfl: 3   tamsulosin (FLOMAX) 0.4 MG CAPS capsule, Take 0.4 mg by mouth daily., Disp: , Rfl:   Physical exam:  Vitals:   01/22/23 1133  BP: 134/80  Pulse: 67  Resp: 18  Temp: (!) 97 F (36.1 C)  TempSrc: Tympanic  SpO2: 100%  Weight: 173 lb (78.5 kg)   Physical Exam Cardiovascular:     Rate and Rhythm: Normal rate and regular rhythm.     Heart sounds: Normal heart sounds.  Pulmonary:     Effort: Pulmonary effort is normal.     Breath sounds: Normal breath sounds.  Abdominal:     General: Bowel sounds are normal.     Palpations: Abdomen is soft.  Skin:    General: Skin is warm and dry.  Neurological:     Mental Status: He is alert and oriented to person, place, and time.         Latest Ref Rng & Units 01/22/2023   10:52 AM  CMP  Glucose 70 - 99 mg/dL 782   BUN 8 - 23 mg/dL 22   Creatinine 9.56 - 1.24 mg/dL 2.13   Sodium 086 - 578 mmol/L 137   Potassium 3.5 - 5.1 mmol/L 4.3   Chloride 98 - 111 mmol/L 102   CO2 22 - 32 mmol/L 24   Calcium 8.9 - 10.3 mg/dL 8.9   Total Protein 6.5 - 8.1 g/dL 7.1   Total Bilirubin <4.6 mg/dL 0.9   Alkaline Phos 38 - 126 U/L 75   AST 15 - 41 U/L 21   ALT 0 - 44 U/L 26       Latest Ref Rng & Units 01/22/2023   10:52 AM  CBC  WBC 4.0 - 10.5 K/uL 8.1   Hemoglobin 13.0 - 17.0 g/dL 96.2   Hematocrit 95.2 - 52.0 % 44.1   Platelets 150 - 400 K/uL 199     No images are attached to the encounter.  VAS US CAROTID  Result Date: 01/18/2023 Carotid Arterial Duplex Study  Patient Name:  ELDON LATTER  Date of Exam:   01/15/2023 Medical Rec #: [id number]           Accession #:    [id number] Date of Birth: 10-Nov-1945          Patient Gender: M Patient Age:   1 years Exam Location:  [facility] Procedure:      VAS US CAROTID Referring Phys: Festus Barren --------------------------------------------------------------------------------  Indications:   Carotid artery disease and Right stent. Risk Factors:  Hypertension, hyperlipidemia, past history of smoking, coronary                artery disease. Other Factors: Right carotid stent 09/11/2021. Performing Technologist: Hardie Lora RVT  Examination Guidelines: A complete evaluation includes B-mode imaging, spectral Doppler, color Doppler, and power Doppler as needed of all accessible portions of each vessel. Bilateral testing is considered an integral part of a complete examination. Limited examinations for reoccurring indications may be performed as noted.  Right Carotid Findings: +----------+--------+--------+--------+------------------+-------------+           PSV cm/sEDV cm/sStenosisPlaque DescriptionComments      +----------+--------+--------+--------+------------------+-------------+ CCA Prox  51      13                                              +----------+--------+--------+--------+------------------+-------------+  CCA Mid   79      19                                              +----------+--------+--------+--------+------------------+-------------+ CCA Distal62      15                                stent         +----------+--------+--------+--------+------------------+-------------+ ICA Prox  73      19      Normal                    stent         +----------+--------+--------+--------+------------------+-------------+ ICA Mid   93      29                                              +----------+--------+--------+--------+------------------+-------------+ ICA  Distal103     31                                              +----------+--------+--------+--------+------------------+-------------+ ECA       270     13      >50%                      through stent +----------+--------+--------+--------+------------------+-------------+ +----------+--------+-------+----------------+-------------------+           PSV cm/sEDV cmsDescribe        Arm Pressure (mmHG) +----------+--------+-------+----------------+-------------------+ Subclavian100            Multiphasic, WNL                    +----------+--------+-------+----------------+-------------------+ +---------+--------+--+--------+-+---------+ VertebralPSV cm/s38EDV cm/s9Antegrade +---------+--------+--+--------+-+---------+  Right Stent(s): +-----------------+--------+--------+--------+--------+--------+ Distal CCA to ICAPSV cm/sEDV cm/sStenosisWaveformComments +-----------------+--------+--------+--------+--------+--------+ Prox to Stent    70      19                               +-----------------+--------+--------+--------+--------+--------+ Proximal Stent   76      19                               +-----------------+--------+--------+--------+--------+--------+ Mid Stent        68      20                               +-----------------+--------+--------+--------+--------+--------+ Distal Stent     70      20                               +-----------------+--------+--------+--------+--------+--------+ Distal to Stent  79      24                               +-----------------+--------+--------+--------+--------+--------+  Left Carotid Findings: +----------+--------+--------+--------+--------------------------+--------+           PSV cm/sEDV cm/sStenosisPlaque Description        Comments +----------+--------+--------+--------+--------------------------+--------+ CCA Prox  92      13                                                  +----------+--------+--------+--------+--------------------------+--------+ CCA Mid   100     18                                                 +----------+--------+--------+--------+--------------------------+--------+ CCA Distal91      16                                                 +----------+--------+--------+--------+--------------------------+--------+ ICA Prox  78      22      1-39%   irregular and heterogenous         +----------+--------+--------+--------+--------------------------+--------+ ICA Mid   99      32                                                 +----------+--------+--------+--------+--------------------------+--------+ ICA Distal89      29                                                 +----------+--------+--------+--------+--------------------------+--------+ ECA       168     0                                                  +----------+--------+--------+--------+--------------------------+--------+ +----------+--------+--------+----------------+-------------------+           PSV cm/sEDV cm/sDescribe        Arm Pressure (mmHG) +----------+--------+--------+----------------+-------------------+ [id number]             Multiphasic, WNL                    +----------+--------+--------+----------------+-------------------+ +---------+--------+--+--------+--+---------+ VertebralPSV cm/s63EDV cm/s15Antegrade +---------+--------+--+--------+--+---------+   Summary: Right Carotid: There is no evidence of stenosis in the right ICA. Patent RICA                stent. Left Carotid: Velocities in the left ICA are consistent with a 1-39% stenosis. Vertebrals:  Bilateral vertebral arteries demonstrate antegrade flow. Subclavians: Normal flow hemodynamics were seen in bilateral subclavian              arteries. *See table(s) above for measurements and observations.  Electronically signed by Festus Barren MD on 01/18/2023 at 7:29:28 AM.     Final    CT CHEST ABDOMEN PELVIS W CONTRAST  Result Date: 01/17/2023 CLINICAL DATA:  History of lung cancer, surveillance. *  Tracking Code: BO *. EXAM: CT CHEST, ABDOMEN, AND PELVIS WITH CONTRAST TECHNIQUE: Multidetector CT imaging of the chest, abdomen and pelvis was performed following the standard protocol during bolus administration of intravenous contrast. RADIATION DOSE REDUCTION: This exam was performed according to the departmental dose-optimization program which includes automated exposure control, adjustment of the mA and/or kV according to patient size and/or use of iterative reconstruction technique. CONTRAST:  85mL OMNIPAQUE IOHEXOL 300 MG/ML  SOLN COMPARISON:  Multiple priors including CT Jul 11, 2022 FINDINGS: CT CHEST FINDINGS Cardiovascular: Right chest Port-A-Cath with tip at the superior cavoatrial junction. No central pulmonary embolus on this nondedicated study. Aortic atherosclerosis. Three-vessel coronary artery calcifications. Normal size heart. No significant pericardial effusion/thickening. Mediastinum/Nodes: No suspicious thyroid nodule. Subcarinal lymph node measures 2.3 x 1.0 cm on image 30/2 previously 2.0 x 1.4 cm. No new pathologically enlarged or enlarging thoracic lymph node identified. Lungs/Pleura: Similar size of the right upper lobe nodule measuring 2.6 x 2.0 cm on image 51/3 previously 2.6 x 2.1 cm. 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 No new suspicious pulmonary nodules or masses. Additional scattered atelectasis/scarring. No pleural effusion. No pneumothorax. Emphysematous change. Musculoskeletal: No aggressive lytic or blastic lesion of bone. Remote left clavicular fracture. Multilevel degenerative changes spine.  Subcutaneous lesion in the right axilla measures 17 mm on image 11/2 similar to multiple prior examinations favored a sebaceous cyst but warrants correlation with direct visualization if not previously performed. CT ABDOMEN PELVIS FINDINGS Hepatobiliary: No suspicious hepatic lesion. Gallbladder is unremarkable. No biliary ductal dilation. Pancreas: No pancreatic ductal dilation or evidence of acute inflammation. Spleen: No splenomegaly. Adrenals/Urinary Tract: Bilateral adrenal glands appear normal. Right upper pole renal cyst. Kidneys demonstrate symmetric enhancement. Urinary bladder is unremarkable for degree of distension. Stomach/Bowel: No radiopaque enteric contrast material was administered. Stomach is unremarkable for degree of distension. No pathologic dilation of small or large bowel. Normal appendix. Vascular/Lymphatic: Aortic atherosclerosis. Normal caliber abdominal aorta. Smooth IVC contours. The portal, splenic and superior mesenteric veins are patent. No pathologically enlarged abdominal or pelvic lymph nodes. Reproductive: UroLift pellets in an enlarged prostate which demonstrates heterogeneous enhancement similar prior. Other: No significant abdominopelvic free fluid. Musculoskeletal: No aggressive lytic or blastic lesion of bone. Multilevel degenerative change of the spine. Degenerative change of the bilateral hips. IMPRESSION: 1. 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 2. 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 3. Slight decreased size of the subcarinal lymph node. No new pathologically enlarged or enlarging thoracic lymph node identified. 4. No evidence of metastatic disease within the abdomen or pelvis. 5. Subcutaneous lesion in  the right axilla measures 17 mm similar to multiple prior examinations favored a sebaceous cyst but warrants correlation with direct visualization if not previously performed. 6. Aortic Atherosclerosis (ICD10-I70.0) and Emphysema (ICD10-J43.9). Electronically Signed   By: Maudry Mayhew M.D.   On: 01/17/2023 16:02     Assessment and plan- Patient is a 77 y.o. male  with squamous cell carcinoma of the right upper lobe stage II BC T2N 1M0.   He is s/p 7 cycles of concurrent chemoradiation ending in October 2023.  He is currently  in remission and here to discuss CT scan results and further management  It has been a Year since patient has completed concurrent chemoradiation for his lung cancer his present CT chest abdomen pelvis with contrast from 01/17/2023 does not show any evidence of recurrent or progressive disease.  Similar size of right upper lobe lung nodule with surrounding radiation fibrosis.  I will see him back in 6 months with a repeat CT scan and labs   Visit Diagnosis 1. Malignant neoplasm of upper lobe of right lung (HCC)   2. Encounter for follow-up surveillance of lung cancer      Dr. Owens Shark, MD, MPH [facility] [phone] 01/27/2023 1:05 PM

## 2023-02-08 DIAGNOSIS — H52223 Regular astigmatism, bilateral: Secondary | ICD-10-CM | POA: Diagnosis not present

## 2023-02-08 DIAGNOSIS — E119 Type 2 diabetes mellitus without complications: Secondary | ICD-10-CM | POA: Diagnosis not present

## 2023-02-08 LAB — HM DIABETES EYE EXAM

## 2023-02-12 DIAGNOSIS — M75102 Unspecified rotator cuff tear or rupture of left shoulder, not specified as traumatic: Secondary | ICD-10-CM | POA: Diagnosis not present

## 2023-02-28 ENCOUNTER — Ambulatory Visit: Payer: Self-pay

## 2023-02-28 ENCOUNTER — Ambulatory Visit: Payer: Medicare Other | Admitting: Internal Medicine

## 2023-02-28 ENCOUNTER — Other Ambulatory Visit: Payer: Self-pay

## 2023-02-28 VITALS — BP 124/82 | HR 82 | Temp 98.1°F | Resp 16 | Wt 173.2 lb

## 2023-02-28 DIAGNOSIS — N3090 Cystitis, unspecified without hematuria: Secondary | ICD-10-CM

## 2023-02-28 DIAGNOSIS — R319 Hematuria, unspecified: Secondary | ICD-10-CM

## 2023-02-28 LAB — POCT URINALYSIS DIPSTICK
Bilirubin, UA: NEGATIVE
Glucose, UA: NEGATIVE
Ketones, UA: NEGATIVE
Nitrite, UA: POSITIVE
Protein, UA: POSITIVE — AB
Spec Grav, UA: 1.02 (ref 1.010–1.025)
Urobilinogen, UA: 0.2 U/dL
pH, UA: 5 (ref 5.0–8.0)

## 2023-02-28 MED ORDER — SULFAMETHOXAZOLE-TRIMETHOPRIM 800-160 MG PO TABS: 1.0000 | ORAL_TABLET | Freq: Two times a day (BID) | ORAL | 0 refills | Status: AC

## 2023-02-28 NOTE — Telephone Encounter (Signed)
Message from [place] T sent at 02/28/2023  8:28 AM EST  Summary: blood in urine   Patient stated when he urinate blood come is like a dark red at time along with black flacky particles, no pain. His Urologist was not able to get him in. Please f/u with patient         Chief Complaint: orange urine, black flaky particles in urine Symptoms: no other sx Frequency: 3-4 days Pertinent Negatives: Patient denies back, flank, abd pain, fever, urination pain or increased urgency Disposition: [] ED /[] Urgent Care (no appt availability in office) / [x] Appointment(In office/virtual)/ []  [place] [facility]/ [] Home Care/ [] Refused Recommended Disposition /[] [place] [facility]/ []  Follow-up with PCP Additional Notes: no appts at [facility], [facility] has opening. Address and phone number provided.  Reason for Disposition  Blood in urine  (Exception: Could be normal menstrual bleeding.)  Answer Assessment - Initial Assessment Questions 1. COLOR of URINE: "Describe the color of the urine."  (e.g., tea-colored, pink, red, bloody) "Do you have blood clots in your urine?" (e.g., none, pea, grape, small coin)     Black flaky- urine is orage 2. ONSET: "When did the bleeding start?"      3 days  3. EPISODES: "How many times has there been blood in the urine?" or "How many times today?"     Yes every time 4. PAIN with URINATION: "Is there any pain with passing your urine?" If Yes, ask: "How bad is the pain?"  (Scale 1-10; or mild, moderate, severe)    - MILD: Complains slightly about urination hurting.    - MODERATE: Interferes with normal activities.      - SEVERE: Excruciating, unwilling or unable to urinate because of the pain.      no 5. FEVER: "Do you have a fever?" If Yes, ask: "What is your temperature, how was it measured, and when did it start?"     no 6. ASSOCIATED SYMPTOMS: "Are you passing urine more frequently than usual?"     no 7. OTHER SYMPTOMS: "Do you have any other symptoms?" (e.g.,  back/flank pain, abdomen pain, vomiting)     no 8. PREGNANCY: "Is there any chance you are pregnant?" "When was your last menstrual period?"     N/a  Protocols used: Urine - Blood In-A-AH

## 2023-02-28 NOTE — Progress Notes (Signed)
Acute Office Visit  Subjective:     Patient ID: Luke Rojas, male    DOB: [birth year], 77 y.o.   MRN: [id number]  Chief Complaint  Patient presents with   Hematuria    Hematuria Irritative symptoms do not include frequency or urgency. Pertinent negatives include no abdominal pain, chills, dysuria, fever or flank pain.   Patient is in today for hematuria. Symptoms started 3 days ago. Has a history of same thing a few years ago which was due to an infection and resolved after taking antibiotic.  Denies dysuria, increased urinary urgency or frequency, suprapubic pain, back pain or flank pain. No fevers. Patient has noticed a string-like black sediment in his urine. This is not all the time but happening several times a day and more frequent. 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    Review of Systems  Constitutional:  Negative for chills and fever.  Gastrointestinal:  Negative for abdominal pain.  Genitourinary:  Positive for hematuria. Negative for dysuria, flank pain, frequency and urgency.  Musculoskeletal:  Negative for back pain.        Objective:    BP 124/82   Pulse 82   Temp 98.1 F (36.7 C) (Oral)   Resp 16   Wt 173 lb 3.2 oz (78.6 kg)   SpO2 98%   BMI 26.33 kg/m    Physical Exam Constitutional:      Appearance: Normal appearance.  HENT:     Head: Normocephalic and atraumatic.  Eyes:     Conjunctiva/sclera: Conjunctivae normal.  Cardiovascular:     Rate and Rhythm: Normal rate and regular rhythm.  Pulmonary:     Effort: Pulmonary effort is normal.     Breath sounds: Normal breath sounds.  Abdominal:     Tenderness: There is no right CVA tenderness or left CVA tenderness.  Skin:    General: Skin is warm and dry.  Neurological:     General: No focal deficit present.     Mental Status: He is alert. Mental status is at baseline.  Psychiatric:        Mood and Affect: Mood normal.        Behavior: Behavior normal.      Results for orders placed or performed in visit on 02/28/23  POCT urinalysis dipstick  Result Value Ref Range   Color, UA gold    Clarity, UA murky    Glucose, UA Negative Negative   Bilirubin, UA negative    Ketones, UA negative    Spec Grav, UA 1.020 1.010 - 1.025   Blood, UA large    pH, UA 5.0 5.0 - 8.0   Protein, UA Positive (A) Negative   Urobilinogen, UA 0.2 0.2 or 1.0 E.U./dL   Nitrite, UA positive    Leukocytes, UA Large (3+) (A) Negative   Appearance cloudy    Odor none         Assessment & Plan:   1. Cystitis (Primary)/Hematuria, unspecified type: UA positive for blood, protein, leukocytes and nitrates. Will treat with bactrim x 7 days and send for urine culture. Patient has an appointment with Urology in January, will ask them about sediment changes but may be due to infection. Follow up if symptoms worsen or fail to improve.   - POCT urinalysis dipstick - sulfamethoxazole-trimethoprim (BACTRIM DS) 800-160 MG tablet; Take 1 tablet by mouth 2 (two) times daily for 7 days.  Dispense: 14 tablet; Refill: 0 - Urine Culture  Return  if symptoms worsen or fail to improve.  Margarita Mail, DO

## 2023-03-03 LAB — URINE CULTURE
MICRO NUMBER:: 15892337
SPECIMEN QUALITY:: ADEQUATE

## 2023-03-04 ENCOUNTER — Ambulatory Visit: Payer: Self-pay

## 2023-03-04 NOTE — Telephone Encounter (Signed)
Message from [place] E sent at 03/04/2023  4:02 PM EST  Summary: Urine culture lab results, wants to speak to nurse   Pt called for lab results regarding his urine culture  Best contact: [phone]         Chief Complaint: Pt given lab results per notes of Dr Caralee Ates on 03/04/23. Pt verbalized understanding.  Symptoms: denies any burning, blood- stated he feels much better  Disposition: [] ED /[] Urgent Care (no appt availability in office) / [] Appointment(In office/virtual)/ []  [place] [facility]/ [x] Home Care/ [] Refused Recommended Disposition /[] [place] [facility]/ []  Follow-up with PCP Additional Notes: will send back to office so Dr Caralee Ates will know how pt is doing   Reason for Disposition  [1] Other NON-URGENT information for PCP AND [2] does not require PCP response  Answer Assessment - Initial Assessment Questions 1. REASON FOR CALL or QUESTION: "What is your reason for calling today?" or "How can I best help you?" or "What question do you have that I can help answer?"     Message from [facility] E sent at 03/04/2023  4:02 PM EST  Summary: Urine culture lab results, wants to speak to nurse   Pt called for lab results regarding his urine culture  Best contact: [phone]  Protocols used: Information Only Call - No Triage-A-AH, PCP Call - No Triage-A-AH

## 2023-03-07 ENCOUNTER — Ambulatory Visit: Payer: Medicare Other | Admitting: Urology

## 2023-03-08 DIAGNOSIS — M75102 Unspecified rotator cuff tear or rupture of left shoulder, not specified as traumatic: Secondary | ICD-10-CM | POA: Diagnosis not present

## 2023-03-22 ENCOUNTER — Ambulatory Visit: Payer: Medicare Other | Admitting: Urology

## 2023-03-22 ENCOUNTER — Encounter: Payer: Self-pay | Admitting: Urology

## 2023-03-22 VITALS — BP 136/81 | HR 89 | Ht 68.0 in | Wt 175.0 lb

## 2023-03-22 DIAGNOSIS — R8281 Pyuria: Secondary | ICD-10-CM | POA: Diagnosis not present

## 2023-03-22 DIAGNOSIS — N400 Enlarged prostate without lower urinary tract symptoms: Secondary | ICD-10-CM

## 2023-03-22 DIAGNOSIS — R8271 Bacteriuria: Secondary | ICD-10-CM

## 2023-03-22 DIAGNOSIS — Z87442 Personal history of urinary calculi: Secondary | ICD-10-CM

## 2023-03-22 LAB — MICROSCOPIC EXAMINATION: WBC, UA: >30 /[HPF] — AB (ref 0–5)

## 2023-03-22 LAB — BLADDER SCAN AMB NON-IMAGING

## 2023-03-22 LAB — URINALYSIS, COMPLETE
Bilirubin, UA: NEGATIVE
Glucose, UA: NEGATIVE
Ketones, UA: NEGATIVE
Nitrite, UA: POSITIVE — AB
Protein,UA: NEGATIVE
Specific Gravity, UA: >1.03 — ABNORMAL HIGH (ref 1.005–1.030)
Urobilinogen, Ur: 0.2 mg/dL (ref 0.2–1.0)
pH, UA: 5 (ref 5.0–7.5)

## 2023-03-22 NOTE — Progress Notes (Signed)
I, Luke Rojas, acting as a scribe for Luke Altes, MD., have documented all relevant documentation on the behalf of Luke Altes, MD, as directed by Luke Altes, MD while in the presence of Luke Altes, MD.  03/22/2023 2:20 PM   Luke Rojas 26-Nov-1945 [id number]  Referring provider: [name], MD [address] [place],  [place] [address]  Chief Complaint  Patient presents with   Benign Prostatic Hypertrophy   Urologic history: 1. BPH with LUTS  Tamsulosin    2. Personal history urinary calculi    3. Pyuria   HPI: Luke Rojas is a 78 y.o. male presents for follow-up visit.  Refer to my previous notes 08/27/22. He was seen for transfer of urologic care. In late December, he was passing some blackish sediment in the urine and was seen by PCP. Dipstick urinalysis was nitrate positive with large leukocytes and urine culture positive for E. coli. He was treated with a 7 day course of Bactrim and states the sediment resolved.  He denies gross hematuria per se. No flank, abdominal, or pelvic pain.   PMH: Past Medical History:  Diagnosis Date   Arthritis    OSTEOARTHRITIS   Chronic kidney disease    kidney stones   COPD (chronic obstructive pulmonary disease) (HCC)    Coronary artery disease    2 stents    Diabetes mellitus without complication (HCC)    GERD (gastroesophageal reflux disease)    Hypercholesteremia    Hypertension    Lung cancer (HCC)    Myocardial infarction [facility]) 2013   Renal colic     Surgical History: Past Surgical History:  Procedure Laterality Date   CARDIAC CATHETERIZATION  2013   2 stents   CAROTID PTA/STENT INTERVENTION Right 09/11/2021   Procedure: CAROTID PTA/STENT INTERVENTION;  Surgeon: Luke Needy, MD;  Location: [facility] INVASIVE CV LAB;  Service: Cardiovascular;  Laterality: Right;   CATARACT EXTRACTION, BILATERAL     COLONOSCOPY  04/27/05   COLONOSCOPY WITH PROPOFOL N/A 06/08/2015    Procedure: COLONOSCOPY WITH PROPOFOL;  Surgeon: [name], MD;  Location: [facility] ENDOSCOPY;  Service: Endoscopy;  Laterality: N/A;   CYSTOSCOPY WITH INSERTION OF UROLIFT N/A 02/11/2020   Procedure: CYSTOSCOPY WITH INSERTION OF UROLIFT;  Surgeon: [name], MD;  Location: [facility] ORS;  Service: Urology;  Laterality: N/A;   CYSTOSCOPY WITH STENT PLACEMENT Right 09/16/2016   Procedure: CYSTOSCOPY WITH STENT PLACEMENT;  Surgeon: [name], MD;  Location: [facility] ORS;  Service: Urology;  Laterality: Right;   EXTRACORPOREAL SHOCK WAVE LITHOTRIPSY Left 11/04/2014   has had 2 previous lithotripsies   EXTRACORPOREAL SHOCK WAVE LITHOTRIPSY Left 03/24/2015   Procedure: EXTRACORPOREAL SHOCK WAVE LITHOTRIPSY (ESWL);  Surgeon: [name], MD;  Location: [facility] ORS;  Service: Urology;  Laterality: Left;   EXTRACORPOREAL SHOCK WAVE LITHOTRIPSY Right 10/04/2016   Procedure: EXTRACORPOREAL SHOCK WAVE LITHOTRIPSY (ESWL);  Surgeon: [name], MD;  Location: [facility] ORS;  Service: Urology;  Laterality: Right;   LEFT HEART CATH AND CORONARY ANGIOGRAPHY N/A 12/05/2018   Procedure: LEFT HEART CATH AND CORONARY ANGIOGRAPHY with possible pci and stent;  Surgeon: [name], MD;  Location: [facility] INVASIVE CV LAB;  Service: Cardiovascular;  Laterality: N/A;   PORTA CATH INSERTION Right 09/25/2021   Procedure: PORTA CATH INSERTION;  Surgeon: Luke Needy, MD;  Location: [facility] INVASIVE CV LAB;  Service: Cardiovascular;  Laterality: Right;    Home Medications:  Allergies  as of 03/22/2023       Reactions   Codeine Nausea Only, Nausea And Vomiting, Other (See Comments)   Other reaction(s): Vomiting        Medication List        Accurate as of March 22, 2023  2:20 PM. If you have any questions, ask your nurse or doctor.          STOP taking these medications    albuterol (2.5 MG/3ML) 0.083% nebulizer solution Commonly known as: PROVENTIL Stopped by: Luke Rojas   albuterol 108 (90  Base) MCG/ACT inhaler Commonly known as: VENTOLIN HFA Stopped by: Luke Rojas   Fish Oil 1000 MG Caps Stopped by: Luke Rojas   guaiFENesin-dextromethorphan 100-10 MG/5ML syrup Commonly known as: ROBITUSSIN DM Stopped by: Luke Rojas   tamsulosin 0.4 MG Caps capsule Commonly known as: FLOMAX Stopped by: Luke Rojas       TAKE these medications    atorvastatin 40 MG tablet Commonly known as: LIPITOR Take 1 tablet (40 mg total) by mouth daily.   AZO CRANBERRY PO Take 2 tablets by mouth daily at 12 noon.   celecoxib 100 MG capsule Commonly known as: CELEBREX Take 100 mg by mouth daily.   cetirizine 10 MG tablet Commonly known as: ZYRTEC Take 1/2 tablet daily   clopidogrel 75 MG tablet Commonly known as: PLAVIX Take 75 mg by mouth daily.   co-enzyme Q-10 30 MG capsule Take 30 mg by mouth daily.   diclofenac sodium 1 % Gel Commonly known as: VOLTAREN Apply 2 g topically 4 (four) times daily.   Krill Oil 1000 MG Caps Take by mouth.   metoprolol tartrate 25 MG tablet Commonly known as: LOPRESSOR Take 1 tablet (25 mg total) by mouth 2 (two) times daily.   MULTIPLE VITAMIN PO Take 1 tablet by mouth daily.   nitroGLYCERIN 0.4 MG SL tablet Commonly known as: NITROSTAT Place under the tongue.   OneTouch Delica Lancets 33G Misc TEST FASTING GLUCOSE LEVEL EACH MORNING BEFORE BREAKFAST   pantoprazole 40 MG tablet Commonly known as: PROTONIX Take 40 mg by mouth daily.   ramipril 2.5 MG capsule Commonly known as: ALTACE TAKE 1 CAPSULE BY MOUTH TWICE DAILY        Allergies:  Allergies  Allergen Reactions   Codeine Nausea Only, Nausea And Vomiting and Other (See Comments)    Other reaction(s): Vomiting    Family History: Family History  Problem Relation Age of Onset   Pulmonary embolism Mother    Transient ischemic attack Mother    Diabetes Mother    Pancreatic cancer Father    Hypertension Father    Diabetes Father    Cirrhosis  Brother     Social History:  reports that he quit smoking about 39 years ago. His smoking use included cigarettes. He started smoking about 69 years ago. He has a 30 pack-year smoking history. His smokeless tobacco use includes chew. He reports that he does not drink alcohol and does not use drugs.   Physical Exam: BP 136/81 (BP Location: Left Arm, Patient Position: Sitting, Cuff Size: Normal)   Pulse 89   Ht 5\' 8"  (1.727 m)   Wt 175 lb (79.4 kg)   BMI 26.61 kg/m   Constitutional:  Alert and oriented, No acute distress. HEENT: [place] AT, moist mucus membranes.  Trachea midline, no masses. Cardiovascular: No clubbing, cyanosis, or edema. Respiratory: Normal respiratory effort, no increased work of breathing. GI: Abdomen is soft, nontender,  nondistended, no abdominal masses Skin: No rashes, bruises or suspicious lesions. Neurologic: Grossly intact, no focal deficits, moving all 4 extremities. Psychiatric: Normal mood and affect.   Urinalysis Dipstick trace blood/nitrate positive/2+ leukocytes, microscopy >30 WBC/mini bacteria.    Assessment & Plan:    1. Bacteriuria PVR today Urine culture ordered. History of recurrent stone disease and passing blackish sediment.  Will schedule a non-contrast CT abdomen/pelvis.  [facility] [address] [place], [place] [address] [phone]

## 2023-03-25 LAB — CULTURE, URINE COMPREHENSIVE

## 2023-03-27 NOTE — Addendum Note (Signed)
Addended by: Levada Schilling on: 03/27/2023 09:40 AM   Modules accepted: Orders

## 2023-03-30 DIAGNOSIS — I951 Orthostatic hypotension: Secondary | ICD-10-CM | POA: Diagnosis not present

## 2023-03-30 DIAGNOSIS — R2689 Other abnormalities of gait and mobility: Secondary | ICD-10-CM | POA: Diagnosis not present

## 2023-04-09 DIAGNOSIS — M75102 Unspecified rotator cuff tear or rupture of left shoulder, not specified as traumatic: Secondary | ICD-10-CM | POA: Diagnosis not present

## 2023-04-11 DIAGNOSIS — M5416 Radiculopathy, lumbar region: Secondary | ICD-10-CM | POA: Diagnosis not present

## 2023-04-19 ENCOUNTER — Ambulatory Visit: Payer: Self-pay

## 2023-04-19 NOTE — Telephone Encounter (Signed)
  Chief Complaint: hypotension Symptoms: BP 107/64, CBG 112 dizziness at times and fatigue Frequency: today BP low, had N/V/D Wednesday night  Pertinent Negatives: Patient denies fever Disposition: [] ED /[] Urgent Care (no appt availability in office) / [x] Appointment(In office/virtual)/ []  [place] Virtual [facility]/ [] Home Care/ [] Refused Recommended Disposition /[] [place] [facility]/ []  Follow-up with PCP Additional Notes: spoke with pt's wife. She reports pt had stomach virus Wednesday night and just started back eating and drinking yesterday, pt GI sx have resolved but now pt feels fatigued and weak and having low BP and dizziness at times. Advised sounds like pt may have some dehydration. Encouraged to force fluids as much as possible and monitor BP. No appts showing until 04/23/23, which pt has CPE that day scheduled. Advised her if they feel like oral hydration doesn't improve sx can go to ED for evaluation and IV hydration. She verbalized understanding.   Reason for Disposition  [1] Systolic BP 90-110 AND [2] taking blood pressure medications AND [3] NOT dizzy, lightheaded or weak  Answer Assessment - Initial Assessment Questions 1. BLOOD PRESSURE: "What is the blood pressure?" "Did you take at least two measurements 5 minutes apart?"     107/60 2. ONSET: "When did you take your blood pressure?"     Wednesday night  3. HOW: "How did you obtain the blood pressure?" (e.g., visiting nurse, automatic home BP monitor)     Home BP 4. HISTORY: "Do you have a history of low blood pressure?" "What is your blood pressure normally?"     no 6. PULSE RATE: "Do you know what your pulse rate is?"      Unsure  7. OTHER SYMPTOMS: "Have you been sick recently?" "Have you had a recent injury?"     Stomach virus, N/V/D weakness  Protocols used: Blood Pressure - Low-A-AH

## 2023-04-22 NOTE — Telephone Encounter (Signed)
 Noted

## 2023-04-23 ENCOUNTER — Encounter: Payer: Self-pay | Admitting: Family Medicine

## 2023-04-24 ENCOUNTER — Inpatient Hospital Stay: Payer: Medicare Other | Attending: Oncology

## 2023-04-24 ENCOUNTER — Other Ambulatory Visit: Payer: Self-pay | Admitting: Family Medicine

## 2023-04-24 DIAGNOSIS — I152 Hypertension secondary to endocrine disorders: Secondary | ICD-10-CM

## 2023-04-25 NOTE — Telephone Encounter (Signed)
Requested Prescriptions  Pending Prescriptions Disp Refills   ramipril (ALTACE) 2.5 MG capsule [Pharmacy Med Name: RAMIPRIL 2.5MG  CAPSULE] 180 capsule 0    Sig: TAKE ONE CAPSULE BY MOUTH TWICE DAILY     Cardiovascular:  ACE Inhibitors Passed - 04/25/2023 11:43 AM      Passed - Cr in normal range and within 180 days    Creatinine  Date Value Ref Range Status  01/22/2023 1.23 0.61 - 1.24 mg/dL Final  44/05/4740 5.95 (H) 0.60 - 1.30 mg/dL Final         Passed - K in normal range and within 180 days    Potassium  Date Value Ref Range Status  01/22/2023 4.3 3.5 - 5.1 mmol/L Final  10/02/2011 4.2 3.5 - 5.1 mmol/L Final         Passed - Patient is not pregnant      Passed - Last BP in normal range    BP Readings from Last 1 Encounters:  03/22/23 136/81         Passed - Valid encounter within last 6 months    Recent Outpatient Visits           1 month ago Cystitis   [facility] Margarita Mail, DO   4 months ago COVID-19   [facility] Merita Norton T, FNP   6 months ago Coronary artery disease involving native coronary artery of native heart without angina pectoris   [facility] [place], Marzella Schlein, MD   7 months ago Other sinusitis, unspecified chronicity   [place] [facility] [place], [place], PA-C   9 months ago Hypertension associated with diabetes [facility])   [facility] [place], Marzella Schlein, MD       Future Appointments             In 5 days Bacigalupo, Marzella Schlein, MD [facility], [facility]

## 2023-04-29 DIAGNOSIS — C3411 Malignant neoplasm of upper lobe, right bronchus or lung: Secondary | ICD-10-CM | POA: Diagnosis not present

## 2023-04-29 DIAGNOSIS — R0602 Shortness of breath: Secondary | ICD-10-CM | POA: Diagnosis not present

## 2023-04-29 DIAGNOSIS — J449 Chronic obstructive pulmonary disease, unspecified: Secondary | ICD-10-CM | POA: Diagnosis not present

## 2023-04-29 DIAGNOSIS — Z7185 Encounter for immunization safety counseling: Secondary | ICD-10-CM | POA: Diagnosis not present

## 2023-04-29 DIAGNOSIS — J9801 Acute bronchospasm: Secondary | ICD-10-CM | POA: Diagnosis not present

## 2023-04-30 ENCOUNTER — Ambulatory Visit (INDEPENDENT_AMBULATORY_CARE_PROVIDER_SITE_OTHER): Payer: Medicare Other | Admitting: Family Medicine

## 2023-04-30 ENCOUNTER — Encounter: Payer: Self-pay | Admitting: Family Medicine

## 2023-04-30 ENCOUNTER — Telehealth: Payer: Self-pay | Admitting: Family Medicine

## 2023-04-30 VITALS — BP 108/69 | HR 80 | Ht 68.0 in | Wt 172.1 lb

## 2023-04-30 DIAGNOSIS — Z23 Encounter for immunization: Secondary | ICD-10-CM

## 2023-04-30 DIAGNOSIS — I152 Hypertension secondary to endocrine disorders: Secondary | ICD-10-CM

## 2023-04-30 DIAGNOSIS — E1169 Type 2 diabetes mellitus with other specified complication: Secondary | ICD-10-CM | POA: Diagnosis not present

## 2023-04-30 DIAGNOSIS — Z0000 Encounter for general adult medical examination without abnormal findings: Secondary | ICD-10-CM

## 2023-04-30 DIAGNOSIS — Z0001 Encounter for general adult medical examination with abnormal findings: Secondary | ICD-10-CM | POA: Diagnosis not present

## 2023-04-30 DIAGNOSIS — E785 Hyperlipidemia, unspecified: Secondary | ICD-10-CM | POA: Diagnosis not present

## 2023-04-30 DIAGNOSIS — E1159 Type 2 diabetes mellitus with other circulatory complications: Secondary | ICD-10-CM

## 2023-04-30 NOTE — Progress Notes (Signed)
 Complete physical exam   Patient: Luke Rojas   DOB: 07/08/1945   78 y.o. Male  MRN: [id number] Visit Date: 04/30/2023  Today's healthcare provider: Shirlee Latch, MD   Chief Complaint  Patient presents with   Annual Exam    Last completed - AWV scheduled tomorrow Diet -  General, well balanced Exercise - none Feeling - fairly well Sleeping - well Concerns - bad balance and tired quickly    Subjective    Luke Rojas is a 78 y.o. male who presents today for a complete physical exam.   Discussed the use of AI scribe software for clinical note transcription with the patient, who gave verbal consent to proceed.  History of Present Illness   The patient, a male with a history of lung issues and back trouble, presents with poor balance and fatigue. He reports feeling tired before he even starts tasks and has given up on some activities due to lack of stamina. He also mentions having a history of lung issues and back trouble. He is currently seeing a lung doctor and a cancer doctor. He also mentions having a port for his cancer treatment, which his doctor is considering removing. The patient is also on blood thinners and has been recommended for a cortisone shot for his shoulder pain. He has been experiencing shoulder pain, for which he received a cortisone shot a few weeks ago. He reports that his arm and shoulder are no longer hurting. He also mentions having a history of heartburn and is currently taking medication for it.        Last depression screening scores    02/28/2023    3:27 PM 10/11/2022   10:12 AM 07/17/2022    1:05 PM  PHQ 2/9 Scores  PHQ - 2 Score 0 0 2  PHQ- 9 Score   5   Last fall risk screening    02/28/2023    3:27 PM  Fall Risk   Falls in the past year? 0  Number falls in past yr: 0  Injury with Fall? 0  Risk for fall due to : No Fall Risks  Follow up Falls evaluation completed        Medications: Outpatient Medications  Prior to Visit  Medication Sig   atorvastatin (LIPITOR) 40 MG tablet Take 1 tablet (40 mg total) by mouth daily.   celecoxib (CELEBREX) 100 MG capsule Take 100 mg by mouth daily.   cetirizine (ZYRTEC) 10 MG tablet Take 1/2 tablet daily   clopidogrel (PLAVIX) 75 MG tablet Take 75 mg by mouth daily.   co-enzyme Q-10 30 MG capsule Take 30 mg by mouth daily.   Cranberry-Vitamin C-Probiotic (AZO CRANBERRY PO) Take 2 tablets by mouth daily at 12 noon.   diclofenac sodium (VOLTAREN) 1 % GEL Apply 2 g topically 4 (four) times daily.   Krill Oil 1000 MG CAPS Take by mouth.   metoprolol tartrate (LOPRESSOR) 25 MG tablet Take 1 tablet (25 mg total) by mouth 2 (two) times daily.   MULTIPLE VITAMIN PO Take 1 tablet by mouth daily.   nitroGLYCERIN (NITROSTAT) 0.4 MG SL tablet Place under the tongue.   OneTouch Delica Lancets 33G MISC TEST FASTING GLUCOSE LEVEL EACH MORNING BEFORE BREAKFAST   pantoprazole (PROTONIX) 40 MG tablet Take 40 mg by mouth daily.   ramipril (ALTACE) 2.5 MG capsule TAKE ONE CAPSULE BY MOUTH TWICE DAILY   No facility-administered medications prior to visit.    Review of Systems  Objective    BP 108/69 (BP Location: Left Arm, Patient Position: Sitting, Cuff Size: Normal)   Pulse 80   Ht 5\' 8"  (1.727 m)   Wt 172 lb 1.6 oz (78.1 kg)   SpO2 98%   BMI 26.17 kg/m    Physical Exam Vitals reviewed.  Constitutional:      General: He is not in acute distress.    Appearance: Normal appearance. He is well-developed. He is not diaphoretic.  HENT:     Head: Normocephalic and atraumatic.     Right Ear: Tympanic membrane, ear canal and external ear normal.     Left Ear: Tympanic membrane, ear canal and external ear normal.     Nose: Nose normal.     Mouth/Throat:     Mouth: Mucous membranes are moist.     Pharynx: Oropharynx is clear. No oropharyngeal exudate.  Eyes:     General: No scleral icterus.    Conjunctiva/sclera: Conjunctivae normal.     Pupils: Pupils are equal,  round, and reactive to light.  Neck:     Thyroid: No thyromegaly.  Cardiovascular:     Rate and Rhythm: Normal rate and regular rhythm.     Heart sounds: Normal heart sounds. No murmur heard. Pulmonary:     Effort: Pulmonary effort is normal. No respiratory distress.     Breath sounds: Wheezing present. No rales.  Abdominal:     General: There is no distension.     Palpations: Abdomen is soft.     Tenderness: There is no abdominal tenderness.  Musculoskeletal:        General: No deformity.     Cervical back: Neck supple.     Right lower leg: No edema.     Left lower leg: No edema.  Lymphadenopathy:     Cervical: No cervical adenopathy.  Skin:    General: Skin is warm and dry.     Findings: No rash.  Neurological:     Mental Status: He is alert and oriented to person, place, and time. Mental status is at baseline.     Gait: Gait normal.  Psychiatric:        Mood and Affect: Mood normal.        Behavior: Behavior normal.        Thought Content: Thought content normal.      No results found for any visits on 04/30/23.  Assessment & Plan    Routine Health Maintenance and Physical Exam  Exercise Activities and Dietary recommendations  Goals      Effective management of HTN     Care Coordination Interventions: Evaluation of current treatment plan related to hypertension self management and patient's adherence to plan as established by provider Provided education to patient re: stroke prevention, s/s of heart attack and stroke Reviewed medications with patient and discussed importance of compliance Discussed plans with patient for ongoing care management follow up and provided patient with direct contact information for care management team Advised patient, providing education and rationale, to monitor blood pressure daily and record, calling PCP for findings outside established parameters Reviewed scheduled/upcoming provider appointments including:         Exercise 3x  per week (30 min per time)     Recommend to exercise for 3 days a week for at least 30 minutes at a time.      Increase water intake     Recommend to increase water intake to 6-8 8 oz glasses a day.  Management of COPD and lung cancer     Care Coordination Interventions: Provided patient with basic written and verbal COPD education on self care/management/and exacerbation prevention Provided written and verbal instructions on pursed lip breathing and utilized returned demonstration as teach back Provided instruction about proper use of medications used for management of COPD including inhalers Advised patient to self assesses COPD action plan zone and make appointment with provider if in the yellow zone for 48 hours without improvement Discussed use of nebulizers.  Medication was changed after office visit with pulmonology.  Feels he is improving. Repeat chest xrays show pneumonia has improved, state he is feeling much better.  Decreased cough and shortness of breath Appointment with ortho on today, AWV on 2/12, Oncology on 5/13, vascular on 5/14, and pulmonary follow upon 5/22         Immunization History  Administered Date(s) Administered   Fluad Quad(high Dose 65+) 12/31/2018, 01/25/2021, 01/08/2022   Fluad Trivalent(High Dose 65+) 04/30/2023   Influenza Split 03/10/2010, 03/11/2012   Influenza, High Dose Seasonal PF 01/07/2014, 10/31/2015, 10/30/2016, 11/22/2017, 12/05/2019   Influenza,inj,Quad PF,6+ Mos 12/09/2012   Moderna Sars-Covid-2 Vaccination 07/22/2019, 08/22/2019, 02/18/2020   Pneumococcal Conjugate-13 01/07/2014   Pneumococcal Polysaccharide-23 03/31/2015   Tdap 09/23/2009   Zoster, Live 04/02/2011    Health Maintenance  Topic Date Due   Zoster Vaccines- Shingrix (1 of 2) 01/03/1964   DTaP/Tdap/Td (2 - Td or Tdap) 09/24/2019   COVID-19 Vaccine (4 - 2024-25 season) 11/04/2022   Medicare Annual Wellness (AWV)  04/17/2023   HEMOGLOBIN A1C  04/13/2023    Diabetic kidney evaluation - Urine ACR  10/11/2023   FOOT EXAM  10/11/2023   Diabetic kidney evaluation - eGFR measurement  01/22/2024   OPHTHALMOLOGY EXAM  02/08/2024   Pneumonia Vaccine 59+ Years old  Completed   INFLUENZA VACCINE  Completed   Hepatitis C Screening  Completed   HPV VACCINES  Aged Out   Colonoscopy  Discontinued    Discussed health benefits of physical activity, and encouraged him to engage in regular exercise appropriate for his age and condition.  Problem List Items Addressed This Visit       Cardiovascular and Mediastinum   Hypertension associated with diabetes (HCC)   Relevant Orders   Comprehensive metabolic panel     Endocrine   Diabetes mellitus (HCC)   Relevant Orders   Comprehensive metabolic panel   Hemoglobin A1c   Hyperlipidemia associated with type 2 diabetes mellitus (HCC)   Relevant Orders   Lipid Panel With LDL/HDL Ratio   Other Visit Diagnoses       Encounter for annual physical exam    -  Primary     Influenza vaccine needed       Relevant Orders   Flu Vaccine Trivalent High Dose (Fluad) (Completed)     Immunization due       Relevant Orders   Flu Vaccine Trivalent High Dose (Fluad) (Completed)           Poor Balance and Fatigue Chronic poor balance and fatigue, likely multifactorial including deconditioning and possible effects of chronic lung disease. Reports significant fatigue before starting activities and poor stamina. Discussed the importance of physical therapy and resuming physical activity to improve balance, stamina, and overall well-being. - Recommend physical therapy for balance improvement - Encourage resumption of physical activity, such as mall walking  Chronic Lung Disease Chronic lung disease with persistent wheezing. Recent pulmonologist follow-up showed slight decline in pulmonary function test results. Scheduled for  a follow-up breathing test in July. Discussed the importance of continued pulmonologist  follow-up to monitor lung function and adjust treatment as needed. - Continue follow-up with pulmonologist - Perform breathing test in July  Back Pain Chronic back pain with recent exacerbation. Received a cortisone shot in the shoulder which provided temporary relief. Discussed the need for orthopedist follow-up for further evaluation and management. Explained that cortisone shots can provide temporary relief but may have side effects with repeated use, such as tendon weakening. - Follow up with orthopedist for back pain management - Consider additional imaging if symptoms persist  General Health Maintenance Routine health maintenance discussed including vaccinations and lab work. Up to date on pneumonia and colonoscopy screenings. Needs flu shot, shingles vaccine, and tetanus shot. Labs for A1c, kidney, and liver function to be drawn today. Explained that Shingrix is more effective than Zostavax and should be administered even if previously vaccinated with Zostavax. Discussed the importance of staying current with vaccinations to prevent illness. - Administer flu shot today - Recommend shingles vaccine (Shingrix) at pharmacy - Recommend tetanus shot (Tdap) at pharmacy - Order labs for A1c, kidney, and liver function  Follow-up - Follow up with nurse for wellness visit tomorrow at 1:10 PM - Review lab results once available.        No follow-ups on file.     Shirlee Latch, MD  [facility] [phone] (phone) [phone] (fax)  [facility]

## 2023-04-30 NOTE — Telephone Encounter (Signed)
 Copied from CRM [id number]. Topic: General - Call Back - No Documentation >> Apr 30, 2023  1:23 PM Geroge Baseman wrote: Reason for CRM: Patient returning call from office. Was just in for appointment and by the time he got home he said he had a missed call. Did not see any notes. Could not confirm with CAL. Please call patient back if you need to speak with him, he stated he is available now.

## 2023-04-30 NOTE — Patient Instructions (Signed)
 Consider asking at the pharmacy about the tetanus shot (TDAP) and the shingles shot (Shingrix)  ?

## 2023-04-30 NOTE — Telephone Encounter (Signed)
 Was not from me or CMA. I think it may have been a reminder call for [facility] AWV tomorrow.

## 2023-05-01 ENCOUNTER — Ambulatory Visit: Payer: Medicare Other | Admitting: Emergency Medicine

## 2023-05-01 VITALS — Ht 68.0 in | Wt 174.0 lb

## 2023-05-01 DIAGNOSIS — Z0000 Encounter for general adult medical examination without abnormal findings: Secondary | ICD-10-CM

## 2023-05-01 LAB — LIPID PANEL WITH LDL/HDL RATIO
Cholesterol, Total: 101 mg/dL (ref 100–199)
HDL: 27 mg/dL — ABNORMAL LOW (ref >39)
LDL Chol Calc (NIH): 35 mg/dL (ref 0–99)
LDL/HDL Ratio: 1.3 {ratio} (ref 0.0–3.6)
Triglycerides: 255 mg/dL — ABNORMAL HIGH (ref 0–149)
VLDL Cholesterol Cal: 39 mg/dL (ref 5–40)

## 2023-05-01 LAB — COMPREHENSIVE METABOLIC PANEL
ALT: 49 [IU]/L — ABNORMAL HIGH (ref 0–44)
AST: 31 [IU]/L (ref 0–40)
Albumin: 4 g/dL (ref 3.8–4.8)
Alkaline Phosphatase: 90 [IU]/L (ref 44–121)
BUN/Creatinine Ratio: 16 (ref 10–24)
BUN: 20 mg/dL (ref 8–27)
Bilirubin Total: 0.3 mg/dL (ref 0.0–1.2)
CO2: 19 mmol/L — ABNORMAL LOW (ref 20–29)
Calcium: 9.3 mg/dL (ref 8.6–10.2)
Chloride: 101 mmol/L (ref 96–106)
Creatinine, Ser: 1.23 mg/dL (ref 0.76–1.27)
Globulin, Total: 3 g/dL (ref 1.5–4.5)
Glucose: 100 mg/dL — ABNORMAL HIGH (ref 70–99)
Potassium: 4.9 mmol/L (ref 3.5–5.2)
Sodium: 138 mmol/L (ref 134–144)
Total Protein: 7 g/dL (ref 6.0–8.5)
eGFR: 60 mL/min/{1.73_m2} (ref >59)

## 2023-05-01 LAB — HEMOGLOBIN A1C
Est. average glucose Bld gHb Est-mCnc: 137 mg/dL
Hgb A1c MFr Bld: 6.4 % — ABNORMAL HIGH (ref 4.8–5.6)

## 2023-05-01 NOTE — Patient Instructions (Addendum)
 [name] , Thank you for taking time to come for your Medicare Wellness Visit. I appreciate your ongoing commitment to your health goals. Please review the following plan we discussed and let me know if I can assist you in the future.   Referrals/Orders/Follow-Ups/Clinician Recommendations: Get the tetanus, shingles and covid vaccines at your convenience.  This is a list of the screening recommended for you and due dates:  Health Maintenance  Topic Date Due   Zoster (Shingles) Vaccine (1 of 2) 01/03/1964   DTaP/Tdap/Td vaccine (2 - Td or Tdap) 09/24/2019   COVID-19 Vaccine (4 - 2024-25 season) 11/04/2022   Yearly kidney health urinalysis for diabetes  10/11/2023   Complete foot exam   10/11/2023   Hemoglobin A1C  10/28/2023   Eye exam for diabetics  02/08/2024   Yearly kidney function blood test for diabetes  04/29/2024   Medicare Annual Wellness Visit  04/30/2024   Pneumonia Vaccine  Completed   Flu Shot  Completed   Hepatitis C Screening  Completed   HPV Vaccine  Aged Out   Colon Cancer Screening  Discontinued    Advanced directives: (Declined) Advance directive discussed with you today. Even though you declined this today, please call our office should you change your mind, and we can give you the proper paperwork for you to fill out.  Next Medicare Annual Wellness Visit scheduled for next year: Yes, 05/06/24 @ 1:10pm (phone visit)  Fall Prevention in the Home, Adult Falls can cause injuries and affect people of all ages. There are many simple things that you can do to make your home safe and to help prevent falls. If you need it, ask for help making these changes. What actions can I take to prevent falls? General information Use good lighting in all rooms. Make sure to: Replace any light bulbs that burn out. Turn on lights if it is dark and use night-lights. Keep items that you use often in easy-to-reach places. Lower the shelves around your home if needed. Move furniture  so that there are clear paths around it. Do not keep throw rugs or other things on the floor that can make you trip. If any of your floors are uneven, fix them. Add color or contrast paint or tape to clearly mark and help you see: Grab bars or handrails. First and last steps of staircases. Where the edge of each step is. If you use a ladder or stepladder: Make sure that it is fully opened. Do not climb a closed ladder. Make sure the sides of the ladder are locked in place. Have someone hold the ladder while you use it. Know where your pets are as you move through your home. What can I do in the bathroom?     Keep the floor dry. Clean up any water that is on the floor right away. Remove soap buildup in the bathtub or shower. Buildup makes bathtubs and showers slippery. Use non-skid mats or decals on the floor of the bathtub or shower. Attach bath mats securely with double-sided, non-slip rug tape. If you need to sit down while you are in the shower, use a non-slip stool. Install grab bars by the toilet and in the bathtub and shower. Do not use towel bars as grab bars. What can I do in the bedroom? Make sure that you have a light by your bed that is easy to reach. Do not use any sheets or blankets on your bed that hang to the floor. Have a  firm bench or chair with side arms that you can use for support when you get dressed. What can I do in the kitchen? Clean up any spills right away. If you need to reach something above you, use a sturdy step stool that has a grab bar. Keep electrical cables out of the way. Do not use floor polish or wax that makes floors slippery. What can I do with my stairs? Do not leave anything on the stairs. Make sure that you have a light switch at the top and the bottom of the stairs. Have them installed if you do not have them. Make sure that there are handrails on both sides of the stairs. Fix handrails that are broken or loose. Make sure that handrails are  as long as the staircases. Install non-slip stair treads on all stairs in your home if they do not have carpet. Avoid having throw rugs at the top or bottom of stairs, or secure the rugs with carpet tape to prevent them from moving. Choose a carpet design that does not hide the edge of steps on the stairs. Make sure that carpet is firmly attached to the stairs. Fix any carpet that is loose or worn. What can I do on the outside of my home? Use bright outdoor lighting. Repair the edges of walkways and driveways and fix any cracks. Clear paths of anything that can make you trip, such as tools or rocks. Add color or contrast paint or tape to clearly mark and help you see high doorway thresholds. Trim any bushes or trees on the main path into your home. Check that handrails are securely fastened and in good repair. Both sides of all steps should have handrails. Install guardrails along the edges of any raised decks or porches. Have leaves, snow, and ice cleared regularly. Use sand, salt, or ice melt on walkways during winter months if you live where there is ice and snow. In the garage, clean up any spills right away, including grease or oil spills. What other actions can I take? Review your medicines with your health care provider. Some medicines can make you confused or feel dizzy. This can increase your chance of falling. Wear closed-toe shoes that fit well and support your feet. Wear shoes that have rubber soles and low heels. Use a cane, walker, scooter, or crutches that help you move around if needed. Talk with your provider about other ways that you can decrease your risk of falls. This may include seeing a physical therapist to learn to do exercises to improve movement and strength. Where to find more information Centers for Disease Control and Prevention, [employer]: TonerPromos.no [employer] on Aging: BaseRingTones.pl National Institute on Aging: BaseRingTones.pl Contact a health care provider if: You  are afraid of falling at home. You feel weak, drowsy, or dizzy at home. You fall at home. Get help right away if you: Lose consciousness or have trouble moving after a fall. Have a fall that causes a head injury. These symptoms may be an emergency. Get help right away. Call 911. Do not wait to see if the symptoms will go away. Do not drive yourself to the hospital. This information is not intended to replace advice given to you by your health care provider. Make sure you discuss any questions you have with your health care provider. Document Revised: 10/23/2021 Document Reviewed: 10/23/2021 Elsevier Patient Education  2024 [employer].

## 2023-05-01 NOTE — Progress Notes (Signed)
 Subjective:   Luke Rojas is a 78 y.o. who presents for a Medicare Wellness preventive visit.  Visit Complete: Virtual I connected with  Dionicio Stall on 05/01/23 by a audio enabled telemedicine application and verified that I am speaking with the correct person using two identifiers.  Patient Location: Home  Provider Location: Home Office  I discussed the limitations of evaluation and management by telemedicine. The patient expressed understanding and agreed to proceed.  Vital Signs: Because this visit was a virtual/telehealth visit, some criteria may be missing or patient reported. Any vitals not documented were not able to be obtained and vitals that have been documented are patient reported.  VideoDeclined- This patient declined Librarian, academic. Therefore the visit was completed with audio only.  AWV Questionnaire: No: Patient Medicare AWV questionnaire was not completed prior to this visit.  Cardiac Risk Factors include: advanced age (>35men, >19 women);male gender;hypertension;diabetes mellitus;dyslipidemia;Other (see comment), Risk factor comments: CAD     Objective:    Today's Vitals   05/01/23 1312  Weight: 174 lb (78.9 kg)  Height: 5\' 8"  (1.727 m)   Body mass index is 26.46 kg/m.     05/01/2023    1:21 PM 01/22/2023   11:30 AM 10/09/2022   12:22 AM 10/08/2022    2:53 PM 07/16/2022   10:05 AM 04/16/2022    2:23 PM 03/13/2022    9:52 AM  Advanced Directives  Does Patient Have a Medical Advance Directive? No No  No No No No  Type of Agricultural consultant;Living will    Would patient like information on creating a medical advance directive? No - Patient declined No - Patient declined No - Patient declined  No - Patient declined Yes (ED - Information included in AVS) No - Patient declined    Current Medications (verified) Outpatient Encounter Medications as of 05/01/2023  Medication Sig    atorvastatin (LIPITOR) 40 MG tablet Take 1 tablet (40 mg total) by mouth daily.   celecoxib (CELEBREX) 100 MG capsule Take 100 mg by mouth daily.   cetirizine (ZYRTEC) 10 MG tablet Take 1/2 tablet daily   clopidogrel (PLAVIX) 75 MG tablet Take 75 mg by mouth daily.   co-enzyme Q-10 30 MG capsule Take 30 mg by mouth daily.   Cranberry-Vitamin C-Probiotic (AZO CRANBERRY PO) Take 2 tablets by mouth daily at 12 noon.   diclofenac sodium (VOLTAREN) 1 % GEL Apply 2 g topically 4 (four) times daily.   Krill Oil 1000 MG CAPS Take by mouth.   metoprolol tartrate (LOPRESSOR) 25 MG tablet Take 1 tablet (25 mg total) by mouth 2 (two) times daily.   MULTIPLE VITAMIN PO Take 1 tablet by mouth daily.   nitroGLYCERIN (NITROSTAT) 0.4 MG SL tablet Place under the tongue.   OneTouch Delica Lancets 33G MISC TEST FASTING GLUCOSE LEVEL EACH MORNING BEFORE BREAKFAST   pantoprazole (PROTONIX) 40 MG tablet Take 40 mg by mouth daily.   ramipril (ALTACE) 2.5 MG capsule TAKE ONE CAPSULE BY MOUTH TWICE DAILY   No facility-administered encounter medications on file as of 05/01/2023.    Allergies (verified) Codeine   History: Past Medical History:  Diagnosis Date   Arthritis    OSTEOARTHRITIS   Chronic kidney disease    kidney stones   COPD (chronic obstructive pulmonary disease) (HCC)    Coronary artery disease    2 stents    Diabetes mellitus without complication (HCC)    GERD (  gastroesophageal reflux disease)    Hypercholesteremia    Hypertension    Lung cancer (HCC)    Myocardial infarction [facility]) 2013   Renal colic    Past Surgical History:  Procedure Laterality Date   CARDIAC CATHETERIZATION  2013   2 stents   CAROTID PTA/STENT INTERVENTION Right 09/11/2021   Procedure: CAROTID PTA/STENT INTERVENTION;  Surgeon: [name], MD;  Location: [facility] INVASIVE CV LAB;  Service: Cardiovascular;  Laterality: Right;   CATARACT EXTRACTION, BILATERAL     COLONOSCOPY  04/27/05   COLONOSCOPY WITH PROPOFOL N/A  06/08/2015   Procedure: COLONOSCOPY WITH PROPOFOL;  Surgeon: [name], MD;  Location: [facility] ENDOSCOPY;  Service: Endoscopy;  Laterality: N/A;   CYSTOSCOPY WITH INSERTION OF UROLIFT N/A 02/11/2020   Procedure: CYSTOSCOPY WITH INSERTION OF UROLIFT;  Surgeon: [name], MD;  Location: [facility] ORS;  Service: Urology;  Laterality: N/A;   CYSTOSCOPY WITH STENT PLACEMENT Right 09/16/2016   Procedure: CYSTOSCOPY WITH STENT PLACEMENT;  Surgeon: [name], MD;  Location: [facility] ORS;  Service: Urology;  Laterality: Right;   EXTRACORPOREAL SHOCK WAVE LITHOTRIPSY Left 11/04/2014   has had 2 previous lithotripsies   EXTRACORPOREAL SHOCK WAVE LITHOTRIPSY Left 03/24/2015   Procedure: EXTRACORPOREAL SHOCK WAVE LITHOTRIPSY (ESWL);  Surgeon: [name], MD;  Location: [facility] ORS;  Service: Urology;  Laterality: Left;   EXTRACORPOREAL SHOCK WAVE LITHOTRIPSY Right 10/04/2016   Procedure: EXTRACORPOREAL SHOCK WAVE LITHOTRIPSY (ESWL);  Surgeon: [name], MD;  Location: [facility] ORS;  Service: Urology;  Laterality: Right;   LEFT HEART CATH AND CORONARY ANGIOGRAPHY N/A 12/05/2018   Procedure: LEFT HEART CATH AND CORONARY ANGIOGRAPHY with possible pci and stent;  Surgeon: [name], MD;  Location: [facility];  Service: Cardiovascular;  Laterality: N/A;   PORTA CATH INSERTION Right 09/25/2021   Procedure: PORTA CATH INSERTION;  Surgeon: [name], MD;  Location: [facility];  Service: Cardiovascular;  Laterality: Right;   Family History  Problem Relation Age of Onset   Pulmonary embolism Mother    Transient ischemic attack Mother    Diabetes Mother    Pancreatic cancer Father    Hypertension Father    Diabetes Father    Cirrhosis Brother    Social History   Socioeconomic History   Marital status: Married    Spouse name: Joyce Gross   Number of children: 2   Years of education: Not on file   Highest education level: 8th grade  Occupational History   Occupation: retired   Tobacco Use   Smoking status: Former    Current packs/day: 0.00    Average packs/day: 1 pack/day for 30.0 years (30.0 ttl pk-yrs)    Types: Cigarettes    Start date: 03/05/1954    Quit date: 03/05/1984    Years since quitting: 39.1   Smokeless tobacco: Current    Types: Chew   Tobacco comments:    Chews tobacco daily.  Vaping Use   Vaping status: Never Used  Substance and Sexual Activity   Alcohol use: No    Alcohol/week: 0.0 standard drinks of alcohol   Drug use: No   Sexual activity: Not Currently  Other Topics Concern   Not on file  Social History Narrative   Lives with Joyce Gross, wife, and son [place].   Social Drivers of Corporate investment banker Strain: Low Risk  (05/01/2023)   Overall Financial Resource Strain (CARDIA)    Difficulty of Paying Living Expenses: Not hard at all  Food Insecurity: No Food Insecurity (05/01/2023)   Hunger Vital Sign    Worried About Running Out of Food in the Last Year: Never true    Ran Out of Food in the Last Year: Never true  Transportation Needs: No Transportation Needs (05/01/2023)   PRAPARE - Administrator, Civil Service (Medical): No    Lack of Transportation (Non-Medical): No  Physical Activity: Insufficiently Active (05/01/2023)   Exercise Vital Sign    Days of Exercise per Week: 1 day    Minutes of Exercise per Session: 90 min  Stress: No Stress Concern Present (05/01/2023)   [employer] of Occupational Health - Occupational Stress Questionnaire    Feeling of Stress : Not at all  Social Connections: Moderately Integrated (05/01/2023)   Social Connection and Isolation Panel [NHANES]    Frequency of Communication with Friends and Family: More than three times a week    Frequency of Social Gatherings with Friends and Family: More than three times a week    Attends Religious Services: More than 4 times per year    Active Member of [employer] or Organizations: No    Attends Banker Meetings: Never    Marital Status:  Married    Tobacco Counseling Ready to quit: Not Answered Counseling given: Not Answered Tobacco comments: Chews tobacco daily.    Clinical Intake:  Pre-visit preparation completed: Yes  Pain : No/denies pain     BMI - recorded: 26.46 Nutritional Status: BMI 25 -29 Overweight Nutritional Risks: None Diabetes: Yes CBG done?: No Did pt. bring in CBG monitor from home?: No  How often do you need to have someone help you when you read instructions, pamphlets, or other written materials from your doctor or pharmacy?: 1 - Never  Interpreter Needed?: No  Information entered by :: Tora Kindred, CMA   Activities of Daily Living     05/01/2023    1:14 PM 10/09/2022   12:28 AM  In your present state of health, do you have any difficulty performing the following activities:  Hearing? 0 0  Vision? 0 0  Difficulty concentrating or making decisions? 0 0  Walking or climbing stairs? 1 0  Comment uses cane prn   Dressing or bathing? 0 0  Doing errands, shopping? 0 0  Preparing Food and eating ? N   Using the Toilet? N   In the past six months, have you accidently leaked urine? N   Do you have problems with loss of bowel control? N   Managing your Medications? N   Managing your Finances? N   Housekeeping or managing your Housekeeping? N     Patient Care Team: [name], MD as PCP - General (Family Medicine) Blair Promise, OD as Consulting Physician (Optometry) [name], MD as Consulting Physician (Urology) [name], MD as Consulting Physician (Cardiology) Glory Buff, RN as Oncology Nurse Navigator  Indicate any recent Medical Services you may have received from other than [facility] providers in the past year (date may be approximate).     Assessment:   This is a routine wellness examination for Chrissie Noa.  Hearing/Vision screen Hearing Screening - Comments:: Denies hearing loss Vision Screening - Comments:: Gets eye exams, Dr. Dion Body,  [facility] [place]   Goals Addressed             This Visit's Progress    Patient Stated       Get back to exercising on a more routine basis  Depression Screen     05/01/2023    1:19 PM 02/28/2023    3:27 PM 10/11/2022   10:12 AM 07/17/2022    1:05 PM 06/14/2022    1:51 PM 06/01/2022   11:18 AM 04/16/2022    2:21 PM  PHQ 2/9 Scores  PHQ - 2 Score 1 0 0 2 2 2  0  PHQ- 9 Score    5 4 3  0    Fall Risk     05/01/2023    1:22 PM 02/28/2023    3:27 PM 09/20/2022   10:38 AM 06/14/2022    1:50 PM 06/01/2022   11:17 AM  Fall Risk   Falls in the past year? 1 0 0 1 0  Number falls in past yr: 0 0 0 1 0  Injury with Fall? 0 0 0 0 0  Risk for fall due to : History of fall(s);Impaired balance/gait;Orthopedic patient;Impaired mobility No Fall Risks No Fall Risks  No Fall Risks  Follow up Falls prevention discussed;Falls evaluation completed;Education provided Falls evaluation completed Falls prevention discussed;Falls evaluation completed Falls evaluation completed     MEDICARE RISK AT HOME:  Medicare Risk at Home Any stairs in or around the home?: Yes If so, are there any without handrails?: No Home free of loose throw rugs in walkways, pet beds, electrical cords, etc?: Yes Adequate lighting in your home to reduce risk of falls?: Yes Life alert?: No Use of a cane, walker or w/c?: Yes (cane prn) Grab bars in the bathroom?: Yes Shower chair or bench in shower?: Yes Elevated toilet seat or a handicapped toilet?: Yes  TIMED UP AND GO:  Was the test performed?  No  Cognitive Function: 6CIT completed        05/01/2023    1:24 PM 04/16/2022    2:25 PM  6CIT Screen  What Year? 0 points 0 points  What month? 0 points 0 points  What time? 0 points 0 points  Count back from 20 0 points 0 points  Months in reverse 2 points 2 points  Repeat phrase 2 points 6 points  Total Score 4 points 8 points    Immunizations Immunization History  Administered Date(s) Administered   Fluad  Quad(high Dose 65+) 12/31/2018, 01/25/2021, 01/08/2022   Fluad Trivalent(High Dose 65+) 04/30/2023   Influenza Split 03/10/2010, 03/11/2012   Influenza, High Dose Seasonal PF 01/07/2014, 10/31/2015, 10/30/2016, 11/22/2017, 12/05/2019   Influenza,inj,Quad PF,6+ Mos 12/09/2012   Moderna Sars-Covid-2 Vaccination 07/22/2019, 08/22/2019, 02/18/2020   Pneumococcal Conjugate-13 01/07/2014   Pneumococcal Polysaccharide-23 03/31/2015   Tdap 09/23/2009   Zoster, Live 04/02/2011    Screening Tests Health Maintenance  Topic Date Due   Zoster Vaccines- Shingrix (1 of 2) 01/03/1964   DTaP/Tdap/Td (2 - Td or Tdap) 09/24/2019   COVID-19 Vaccine (4 - 2024-25 season) 11/04/2022   Diabetic kidney evaluation - Urine ACR  10/11/2023   FOOT EXAM  10/11/2023   HEMOGLOBIN A1C  10/28/2023   OPHTHALMOLOGY EXAM  02/08/2024   Diabetic kidney evaluation - eGFR measurement  04/29/2024   Medicare Annual Wellness (AWV)  04/30/2024   Pneumonia Vaccine 64+ Years old  Completed   INFLUENZA VACCINE  Completed   Hepatitis C Screening  Completed   HPV VACCINES  Aged Out   Colonoscopy  Discontinued    Health Maintenance  Health Maintenance Due  Topic Date Due   Zoster Vaccines- Shingrix (1 of 2) 01/03/1964   DTaP/Tdap/Td (2 - Td or Tdap) 09/24/2019   COVID-19 Vaccine (4 -  2024-25 season) 11/04/2022   Health Maintenance Items Addressed: See Nurse Notes  Additional Screening:  Vision Screening: Recommended annual ophthalmology exams for early detection of glaucoma and other disorders of the eye.  Dental Screening: Recommended annual dental exams for proper oral hygiene  Community Resource Referral / Chronic Care Management: CRR required this visit?  No   CCM required this visit?  No     Plan:     I have personally reviewed and noted the following in the patient's chart:   Medical and social history Use of alcohol, tobacco or illicit drugs  Current medications and supplements including opioid  prescriptions. Patient is not currently taking opioid prescriptions. Functional ability and status Nutritional status Physical activity Advanced directives List of other physicians Hospitalizations, surgeries, and ER visits in previous 12 months Vitals Screenings to include cognitive, depression, and falls Referrals and appointments  In addition, I have reviewed and discussed with patient certain preventive protocols, quality metrics, and best practice recommendations. A written personalized care plan for preventive services as well as general preventive health recommendations were provided to patient.     Tora Kindred, CMA   05/01/2023   After Visit Summary: (Declined) Due to this being a telephonic visit, with patients personalized plan was offered to patient but patient Declined AVS at this time   Notes:  6 CIT Score - 4 Needs Tdap, shingles and covid vaccines Declined Dm & Nutrition education

## 2023-05-02 ENCOUNTER — Encounter: Payer: Self-pay | Admitting: Family Medicine

## 2023-05-15 DIAGNOSIS — M5416 Radiculopathy, lumbar region: Secondary | ICD-10-CM | POA: Diagnosis not present

## 2023-05-15 DIAGNOSIS — E119 Type 2 diabetes mellitus without complications: Secondary | ICD-10-CM | POA: Diagnosis not present

## 2023-05-16 ENCOUNTER — Ambulatory Visit: Payer: Self-pay | Admitting: Family Medicine

## 2023-05-16 NOTE — Telephone Encounter (Signed)
 Yes. This is expected after cortisone shots.  Can last for a week or so. Will improve. Agree with recs to hydrate well and watch carb intake.

## 2023-05-16 NOTE — Telephone Encounter (Signed)
  Chief Complaint: elevated glucose- patient had cortisone shots yesterday  Symptoms: no symptoms- just elevated glucose reading- patient advised by provider that did injcetion to call PCP if glucose over 200 Frequency: today Pertinent Negatives: Patient denies fever, frequent urination, difficulty breathing, dizziness, weakness, vomiting Disposition: [] ED /[] Urgent Care (no appt availability in office) / [] Appointment(In office/virtual)/ []  [place] [facility]/ [] Home Care/ [] Refused Recommended Disposition /[] [place] [facility]/ [x]  Follow-up with PCP Additional Notes: Patient had 3 cortisone injections yesterday- hsi glucose in elevated today and he is concerned. Patient would like to know how long to expect higher glucose readings. Patient advised make sure he is hydrating and watching his diet, chack fasting and if chacking during the day- 2 hours after meal. . Will get provider instruction for him.    Copied from CRM [id number]. Topic: Clinical - Medical Advice >> May 16, 2023  2:27 PM Luke Rojas wrote: Reason for CRM: Patient is requesting a callback to see what he can do for the high blood sugars ranging from 177-200.  Callback #: [phone] Reason for Disposition  Blood glucose 70-240 mg/dL (3.9 -14.7 mmol/L)  Answer Assessment - Initial Assessment Questions 1. BLOOD GLUCOSE: "What is your blood glucose level?"      This afternoon 200- last ate 1 hour ago 2. ONSET: "When did you check the blood glucose?"     Checked today- was elevated today- but patient had 3 cortisone shots yesterday 3. USUAL RANGE: "What is your glucose level usually?" (e.g., usual fasting morning value, usual evening value)     90-120 4. KETONES: "Do you check for ketones (urine or blood test strips)?" If Yes, ask: "What does the test show now?"      no 5. TYPE 1 or 2:  "Do you know what type of diabetes you have?"  (e.g., Type 1, Type 2, Gestational; doesn't know)      Type 2 6. INSULIN: "Do you take  insulin?" "What type of insulin(s) do you use? What is the mode of delivery? (syringe, pen; injection or pump)?"      no 7. DIABETES PILLS: "Do you take any pills for your diabetes?" If Yes, ask: "Have you missed taking any pills recently?"     no 8. OTHER SYMPTOMS: "Do you have any symptoms?" (e.g., fever, frequent urination, difficulty breathing, dizziness, weakness, vomiting)     no  Protocols used: Diabetes - High Blood Sugar-A-AH

## 2023-05-16 NOTE — Telephone Encounter (Signed)
Pt advised & verbalized understanding

## 2023-05-28 ENCOUNTER — Ambulatory Visit: Payer: Self-pay

## 2023-05-28 ENCOUNTER — Other Ambulatory Visit: Payer: Self-pay

## 2023-05-28 ENCOUNTER — Encounter: Payer: Self-pay | Admitting: Internal Medicine

## 2023-05-28 ENCOUNTER — Ambulatory Visit (INDEPENDENT_AMBULATORY_CARE_PROVIDER_SITE_OTHER): Admitting: Internal Medicine

## 2023-05-28 VITALS — BP 136/74 | HR 90 | Temp 98.1°F | Resp 16 | Ht 64.0 in | Wt 172.0 lb

## 2023-05-28 DIAGNOSIS — I951 Orthostatic hypotension: Secondary | ICD-10-CM | POA: Diagnosis not present

## 2023-05-28 DIAGNOSIS — R2689 Other abnormalities of gait and mobility: Secondary | ICD-10-CM | POA: Diagnosis not present

## 2023-05-28 NOTE — Progress Notes (Signed)
   Acute Office Visit  Subjective:     Patient ID: Luke Rojas, male    DOB: 1945-06-27, 78 y.o.   MRN: [id number]  Chief Complaint  Patient presents with   Dizziness    HPI Patient is in today for dizziness.  Patient states he feels weak and not well-balanced while walking.  He generally does not need an ambulatory device while at home but has been using a cane more often while outside of the home.  He states he had 3 steroid injections in his back about 2 weeks ago which seems to make the dizziness worse.  His blood pressure has been high at home he says as high as 200/100.  He is currently taking metoprolol 25 mg twice daily and Ramipril 2.5 mg BID, which he has been on for years.  The dizziness is worse whenever he changes positions when he feels "swimmy headed or lightheaded.  Denies falls or passing out.  Denies vertigo.   Review of Systems  Constitutional:  Negative for chills and fever.  Neurological:  Positive for dizziness and weakness.        Objective:    BP 136/74   Pulse 90   Temp 98.1 F (36.7 C) (Oral)   Resp 16   Ht 5\' 4"  (1.626 m)   Wt 172 lb (78 kg)   SpO2 98%   BMI 29.52 kg/m   Vitals:   05/28/23 1410 05/28/23 1517  BP: (!) 142/80 136/74   BP Readings from Last 3 Encounters:  05/28/23 (!) 142/80  04/30/23 108/69  03/22/23 136/81   Wt Readings from Last 3 Encounters:  05/28/23 172 lb (78 kg)  05/01/23 174 lb (78.9 kg)  04/30/23 172 lb 1.6 oz (78.1 kg)      Physical Exam Constitutional:      Appearance: Normal appearance.  HENT:     Head: Normocephalic and atraumatic.  Eyes:     Conjunctiva/sclera: Conjunctivae normal.  Cardiovascular:     Rate and Rhythm: Normal rate and regular rhythm.  Pulmonary:     Effort: Pulmonary effort is normal.     Breath sounds: Normal breath sounds.  Skin:    General: Skin is warm and dry.  Neurological:     General: No focal deficit present.     Mental Status: He is alert. Mental status is at  baseline.  Psychiatric:        Mood and Affect: Mood normal.        Behavior: Behavior normal.     No results found for any visits on 05/28/23.      Assessment & Plan:   1. Orthostatic hypotension (Primary)/Balance problems: Blood pressure here well-controlled, I do not think his medications need to be changed at this time.  Symptoms are consistent with orthostatic hypotension, discussed this in depth, standing and walking slowly and staying well-hydrated.  I think he would benefit from physical therapy to help with lower extremity strengthening and balance exercises, referral placed.  Information orthostatic hypotension printed for the patient to review.  Patient to follow-up if symptoms worsen or fail to improve.  - Ambulatory referral to Physical Therapy  Return if symptoms worsen or fail to improve.  Margarita Mail, DO

## 2023-05-28 NOTE — Telephone Encounter (Signed)
 Chief Complaint: dizziness Symptoms: dizziness, moderate headache, fatigue, hypertension Frequency: x 2-3 weeks Pertinent Negatives: Patient denies facial droop, unilateral numbness or weakness, changes in speech or vision, SOB/difficulty breathing, severe headache, chest pain Disposition: [] ED /[] Urgent Care (no appt availability in office) / [x] Appointment(In office/virtual)/ []  [place] [facility]/ [] Home Care/ [] Refused Recommended Disposition /[]  Mobile Bus/ []  Follow-up with PCP Additional Notes: Patient states he was having back pain and received cortisone injections in his back thru Ortho about 2 weeks ago. He states he has been having dizziness, "swimming headed" since. Patient states he checked his BP this AM and it was 156/96. He is agreeable to acute visit today with available provider at [facility]. Patient verbalizes understanding to call back for new or worsening symptoms.  Copied from CRM [id number]. Topic: Clinical - Red Word Triage >> May 28, 2023  9:35 AM Clayton Bibles wrote: Red Word that prompted transfer to Nurse Triage:  He is very dizzy; he feels like he could fall; blood pressure 156/80 Reason for Disposition  [1] MODERATE dizziness (e.g., interferes with normal activities) AND [2] has NOT been evaluated by doctor (or NP/PA) for this  (Exception: Dizziness caused by heat exposure, sudden standing, or poor fluid intake.)  Answer Assessment - Initial Assessment Questions 1. DESCRIPTION: "Describe your dizziness."     "Swimmy headed", he states if he is walking down the hall he has to hold onto the wall to hold himself up from falling over.  2. LIGHTHEADED: "Do you feel lightheaded?" (e.g., somewhat faint, woozy, weak upon standing)     Yes.  3. VERTIGO: "Do you feel like either you or the room is spinning or tilting?" (i.e. vertigo)     Denies.  4. SEVERITY: "How bad is it?"  "Do you feel like you are going to faint?" "Can you stand and  walk?"   - MILD: Feels slightly dizzy, but walking normally.   - MODERATE: Feels unsteady when walking, but not falling; interferes with normal activities (e.g., school, work).   - SEVERE: Unable to walk without falling, or requires assistance to walk without falling; feels like passing out now.      Denies any falls but states he is unsteady when walking and has to hold onto the wall. Moderate.  5. ONSET:  "When did the dizziness begin?"     X 2-3 weeks ago.  6. AGGRAVATING FACTORS: "Does anything make it worse?" (e.g., standing, change in head position)     He states when he gets to doing something, picking up things or making his bed. Improves after sitting down for a while.  7. HEART RATE: "Can you tell me your heart rate?" "How many beats in 15 seconds?"  (Note: not all patients can do this)       Heart rate 77 this AM around 0700.  8. CAUSE: "What do you think is causing the dizziness?"     He states he was having back pain, received cortisone shots in his back about 2 weeks ago. Possible side effect.  9. RECURRENT SYMPTOM: "Have you had dizziness before?" If Yes, ask: "When was the last time?" "What happened that time?"     He states he has but it's been a while.   10. OTHER SYMPTOMS: "Do you have any other symptoms?" (e.g., fever, chest pain, vomiting, diarrhea, bleeding)       He is concerned about his BP states it has been high, 156/96 around 0700 this AM.  11. PREGNANCY: "Is  there any chance you are pregnant?" "When was your last menstrual period?"       N/A.  Protocols used: Dizziness - Lightheadedness-A-AH

## 2023-05-28 NOTE — Patient Instructions (Addendum)
 Orthostatic Hypotension Blood pressure is a measurement of how strongly, or weakly, your circulating blood is pressing against the walls of your arteries. Orthostatic hypotension is a drop in blood pressure that can happen when you change positions, such as when you go from lying down to standing. Arteries are blood vessels that carry blood from your heart throughout your body. When blood pressure is too low, you may not get enough blood to your brain or to the rest of your organs. Orthostatic hypotension can cause light-headedness, sweating, rapid heartbeat, blurred vision, and fainting. These symptoms require further investigation into the cause. What are the causes? Orthostatic hypotension can be caused by many things, including: Sudden changes in posture, such as standing up quickly after you have been sitting or lying down. Loss of blood (anemia) or loss of body fluids (dehydration). Heart problems, neurologic problems, or hormone problems. Pregnancy. Aging. The risk for this condition increases as you get older. Severe infection (sepsis). Certain medicines, such as medicines for high blood pressure or medicines that make the body lose excess fluids (diuretics). What are the signs or symptoms? Symptoms of this condition may include: Weakness, light-headedness, or dizziness. Sweating. Blurred vision. Tiredness (fatigue). Rapid heartbeat. Fainting, in severe cases. How is this diagnosed? This condition is diagnosed based on: Your symptoms and medical history. Your blood pressure measurements. Your health care provider will check your blood pressure when you are: Lying down. Sitting. Standing. A blood pressure reading is recorded as two numbers, such as "120 over 80" (or 120/80). The first ("top") number is called the systolic pressure. It is a measure of the pressure in your arteries as your heart beats. The second ("bottom") number is called the diastolic pressure. It is a measure of  the pressure in your arteries when your heart relaxes between beats. Blood pressure is measured in a unit called mmHg. Healthy blood pressure for most adults is 120/80 mmHg. Orthostatic hypotension is defined as a 20 mmHg drop in systolic pressure or a 10 mmHg drop in diastolic pressure within 3 minutes of standing. Other information or tests that may be used to diagnose orthostatic hypotension include: Your other vital signs, such as your heart rate and temperature. Blood tests. An electrocardiogram (ECG) or echocardiogram. A Holter monitor. This is a device you wear that records your heart rhythm continuously, usually for 24-48 hours. Tilt table test. For this test, you will be safely secured to a table that moves you from a lying position to an upright position. Your heart rhythm and blood pressure will be monitored during the test. How is this treated? This condition may be treated by: Changing your diet. This may involve eating more salt (sodium) or drinking more water. Changing the dosage of certain medicines you are taking that might be lowering your blood pressure. Correcting the underlying reason for the orthostatic hypotension. Wearing compression stockings. Taking medicines to raise your blood pressure. Avoiding actions that trigger symptoms. Follow these instructions at home: Medicines Take over-the-counter and prescription medicines only as told by your health care provider. Follow instructions from your health care provider about changing the dosage of your current medicines, if this applies. Do not stop or adjust any of your medicines on your own. Eating and drinking  Drink enough fluid to keep your urine pale yellow. Eat extra salt only as directed. Do not add extra salt to your diet unless advised by your health care provider. Eat frequent, small meals. Avoid standing up suddenly after eating. General instructions  Get up slowly from lying down or sitting positions. This  gives your blood pressure a chance to adjust. Avoid hot showers and excessive heat as directed by your health care provider. Engage in regular physical activity as directed by your health care provider. If you have compression stockings, wear them as told. Keep all follow-up visits. This is important. Contact a health care provider if: You have a fever for more than 2-3 days. You feel more thirsty than usual. You feel dizzy or weak. Get help right away if: You have chest pain. You have a fast or irregular heartbeat. You become sweaty or feel light-headed. You feel short of breath. You faint. You have any symptoms of a stroke. "BE FAST" is an easy way to remember the main warning signs of a stroke: B - Balance. Signs are dizziness, sudden trouble walking, or loss of balance. E - Eyes. Signs are trouble seeing or a sudden change in vision. F - Face. Signs are sudden weakness or numbness of the face, or the face or eyelid drooping on one side. A - Arms. Signs are weakness or numbness in an arm. This happens suddenly and usually on one side of the body. S - Speech. Signs are sudden trouble speaking, slurred speech, or trouble understanding what people say. T - Time. Time to call emergency services. Write down what time symptoms started. You have other signs of a stroke, such as: A sudden, severe headache with no known cause. Nausea or vomiting. Seizure. These symptoms may represent a serious problem that is an emergency. Do not wait to see if the symptoms will go away. Get medical help right away. Call your local emergency services (911 in the [place] Do not drive yourself to the hospital. Summary Orthostatic hypotension is a sudden drop in blood pressure. It can cause light-headedness, sweating, rapid heartbeat, blurred vision, and fainting. Orthostatic hypotension can be diagnosed by having your blood pressure taken while lying down, sitting, and then standing. Treatment may involve  changing your diet, wearing compression stockings, sitting up slowly, adjusting your medicines, or correcting the underlying reason for the orthostatic hypotension. Get help right away if you have chest pain, a fast or irregular heartbeat, or symptoms of a stroke. This information is not intended to replace advice given to you by your health care provider. Make sure you discuss any questions you have with your health care provider. Document Revised: 05/05/2020 Document Reviewed: 05/05/2020 Elsevier Patient Education  2024 [employer].

## 2023-05-30 ENCOUNTER — Other Ambulatory Visit: Payer: Self-pay

## 2023-05-30 ENCOUNTER — Encounter: Payer: Self-pay | Admitting: Internal Medicine

## 2023-05-30 ENCOUNTER — Ambulatory Visit: Admitting: Internal Medicine

## 2023-05-30 VITALS — BP 140/90 | HR 91 | Temp 98.1°F | Resp 14 | Ht 68.0 in | Wt 172.0 lb

## 2023-05-30 DIAGNOSIS — R2689 Other abnormalities of gait and mobility: Secondary | ICD-10-CM | POA: Diagnosis not present

## 2023-05-30 DIAGNOSIS — I152 Hypertension secondary to endocrine disorders: Secondary | ICD-10-CM

## 2023-05-30 DIAGNOSIS — E1159 Type 2 diabetes mellitus with other circulatory complications: Secondary | ICD-10-CM

## 2023-05-30 DIAGNOSIS — I951 Orthostatic hypotension: Secondary | ICD-10-CM | POA: Diagnosis not present

## 2023-05-30 MED ORDER — METOPROLOL TARTRATE 25 MG PO TABS: 12.5000 mg | ORAL_TABLET | Freq: Two times a day (BID) | ORAL | 1 refills | Status: DC

## 2023-05-30 MED ORDER — RAMIPRIL 5 MG PO CAPS: 5.0000 mg | ORAL_CAPSULE | Freq: Two times a day (BID) | ORAL | 1 refills | Status: DC

## 2023-05-30 NOTE — Progress Notes (Signed)
   Acute Office Visit  Subjective:     Patient ID: Luke Rojas, male    DOB: [birth year], 78 y.o.   MRN: [id number]  Chief Complaint  Patient presents with   Dizziness   Hypertension    Patient is in today for follow-up on blood pressure and continuing to feel weak.  He was just seen 2 days ago for this and was referred to physical therapy to help with balance issues and strengthening.  He does have symptoms of orthostatic hypotension which is why his blood pressure medications were not changed previously.  Patient states he has been checking his blood pressure at home and has been ranging high 140-150/90s.  Review of Systems  Constitutional:  Negative for chills and fever.  Neurological:  Positive for dizziness and weakness.       Objective:    BP (!) 140/90   Pulse 91   Temp 98.1 F (36.7 C) (Oral)   Resp 14   Ht 5\' 8"  (1.727 m)   Wt 172 lb (78 kg)   SpO2 99%   BMI 26.15 kg/m   Vitals:   05/30/23 1308  BP: (!) 140/90   BP Readings from Last 3 Encounters:  05/30/23 (!) 140/90  05/28/23 136/74  04/30/23 108/69   Wt Readings from Last 3 Encounters:  05/30/23 172 lb (78 kg)  05/28/23 172 lb (78 kg)  05/01/23 174 lb (78.9 kg)      Physical Exam Constitutional:      Appearance: Normal appearance.  HENT:     Head: Normocephalic and atraumatic.  Eyes:     Conjunctiva/sclera: Conjunctivae normal.  Cardiovascular:     Rate and Rhythm: Normal rate and regular rhythm.  Pulmonary:     Effort: Pulmonary effort is normal.     Breath sounds: Normal breath sounds.  Skin:    General: Skin is warm and dry.  Neurological:     General: No focal deficit present.     Mental Status: He is alert. Mental status is at baseline.  Psychiatric:        Mood and Affect: Mood normal.        Behavior: Behavior normal.     No results found for any visits on 05/30/23.      Assessment & Plan:   1. Hypertension associated with diabetes (HCC)/Balance  problems/Orthostatic hypotension (Primary): Thinking his beta-blocker may be too high causing fatigue and weakness.  Will decrease metoprolol to 12.5 mg twice daily and increase ramipril to 5 mg twice daily.  Follow-up in 1 month for recheck.  - ramipril (ALTACE) 5 MG capsule; Take 1 capsule (5 mg total) by mouth 2 (two) times daily.  Dispense: 60 capsule; Refill: 1 - metoprolol tartrate (LOPRESSOR) 25 MG tablet; Take 0.5 tablets (12.5 mg total) by mouth 2 (two) times daily.  Dispense: 60 tablet; Refill: 1   Return in about 4 weeks (around 06/27/2023).  Margarita Mail, DO

## 2023-05-30 NOTE — Patient Instructions (Signed)
 It was great seeing you today!  Plan discussed at today's visit: -Blood pressure medications: decrease Metoprolol to 12.5 mg twice a day and increase Ramipril to 5 mg twice a day -Continue to monitor BP at home   Follow up in: 1 month   Take care and let us know if you have any questions or concerns prior to your next visit.  Dr. Caralee Ates

## 2023-06-06 DIAGNOSIS — M5416 Radiculopathy, lumbar region: Secondary | ICD-10-CM | POA: Diagnosis not present

## 2023-06-17 ENCOUNTER — Other Ambulatory Visit: Payer: Self-pay | Admitting: Family Medicine

## 2023-06-18 NOTE — Telephone Encounter (Signed)
 Requested Prescriptions  Pending Prescriptions Disp Refills   atorvastatin (LIPITOR) 40 MG tablet [Pharmacy Med Name: ATORVASTATIN CALCIUM 40MG  TABLET] 90 tablet 3    Sig: TAKE ONE TABLET BY MOUTH DAILY     Cardiovascular:  Antilipid - Statins Failed - 06/18/2023  1:36 PM      Failed - Lipid Panel in normal range within the last 12 months    Cholesterol, Total  Date Value Ref Range Status  04/30/2023 101 100 - 199 mg/dL Final   LDL Chol Calc (NIH)  Date Value Ref Range Status  04/30/2023 35 0 - 99 mg/dL Final   HDL  Date Value Ref Range Status  04/30/2023 27 (L) >39 mg/dL Final   Triglycerides  Date Value Ref Range Status  04/30/2023 255 (H) 0 - 149 mg/dL Final         Passed - Patient is not pregnant      Passed - Valid encounter within last 12 months    Recent Outpatient Visits           2 weeks ago Orthostatic hypotension   [facility] Luke Cid, Luke Rojas   3 weeks ago Orthostatic hypotension   [facility] Luke Cid, Luke Rojas   1 month ago Encounter for annual physical exam   [facility], Luke Eans, Luke Rojas

## 2023-06-21 DIAGNOSIS — M533 Sacrococcygeal disorders, not elsewhere classified: Secondary | ICD-10-CM | POA: Diagnosis not present

## 2023-06-21 DIAGNOSIS — M791 Myalgia, unspecified site: Secondary | ICD-10-CM | POA: Diagnosis not present

## 2023-06-28 ENCOUNTER — Telehealth: Payer: Self-pay | Admitting: Family Medicine

## 2023-06-28 DIAGNOSIS — R2689 Other abnormalities of gait and mobility: Secondary | ICD-10-CM

## 2023-06-28 DIAGNOSIS — R269 Unspecified abnormalities of gait and mobility: Secondary | ICD-10-CM

## 2023-06-28 NOTE — Telephone Encounter (Signed)
 Copied from CRM [id number]. Topic: Referral - Request for Referral >> Jun 28, 2023 10:39 AM [place] wrote: Did the patient discuss referral with their provider in the last year? Yes (If No - schedule appointment) (If Yes - send message)  Appointment offered? No  Type of order/referral and detailed reason for visit: Physical Therapy for Balance  Preference of office, provider, location: he does not remember  If referral order, have you been seen by this specialty before? No (If Yes, this issue or another issue? When? Where?  Can we respond through MyChart? No

## 2023-07-01 NOTE — Telephone Encounter (Signed)
 Ok to place PT referral for [facility]. Use diagnosis imbalance, abnormal gait

## 2023-07-15 ENCOUNTER — Emergency Department

## 2023-07-15 ENCOUNTER — Other Ambulatory Visit: Payer: Self-pay

## 2023-07-15 ENCOUNTER — Emergency Department: Admission: EM | Admit: 2023-07-15 | Discharge: 2023-07-15 | Disposition: A | Discharge status: Home / Self Care

## 2023-07-15 ENCOUNTER — Encounter: Payer: Self-pay | Admitting: Emergency Medicine

## 2023-07-15 DIAGNOSIS — R0602 Shortness of breath: Secondary | ICD-10-CM

## 2023-07-15 DIAGNOSIS — N281 Cyst of kidney, acquired: Secondary | ICD-10-CM | POA: Diagnosis not present

## 2023-07-15 DIAGNOSIS — C3490 Malignant neoplasm of unspecified part of unspecified bronchus or lung: Secondary | ICD-10-CM | POA: Diagnosis not present

## 2023-07-15 DIAGNOSIS — I129 Hypertensive chronic kidney disease with stage 1 through stage 4 chronic kidney disease, or unspecified chronic kidney disease: Secondary | ICD-10-CM | POA: Diagnosis not present

## 2023-07-15 DIAGNOSIS — J479 Bronchiectasis, uncomplicated: Secondary | ICD-10-CM | POA: Diagnosis not present

## 2023-07-15 DIAGNOSIS — I2510 Atherosclerotic heart disease of native coronary artery without angina pectoris: Secondary | ICD-10-CM | POA: Insufficient documentation

## 2023-07-15 DIAGNOSIS — J441 Chronic obstructive pulmonary disease with (acute) exacerbation: Secondary | ICD-10-CM | POA: Diagnosis not present

## 2023-07-15 DIAGNOSIS — Z85118 Personal history of other malignant neoplasm of bronchus and lung: Secondary | ICD-10-CM | POA: Diagnosis not present

## 2023-07-15 DIAGNOSIS — J189 Pneumonia, unspecified organism: Secondary | ICD-10-CM | POA: Diagnosis not present

## 2023-07-15 DIAGNOSIS — S42001A Fracture of unspecified part of right clavicle, initial encounter for closed fracture: Secondary | ICD-10-CM | POA: Diagnosis not present

## 2023-07-15 DIAGNOSIS — S42002A Fracture of unspecified part of left clavicle, initial encounter for closed fracture: Secondary | ICD-10-CM | POA: Diagnosis not present

## 2023-07-15 DIAGNOSIS — M25511 Pain in right shoulder: Secondary | ICD-10-CM | POA: Diagnosis not present

## 2023-07-15 DIAGNOSIS — M25512 Pain in left shoulder: Secondary | ICD-10-CM | POA: Diagnosis not present

## 2023-07-15 DIAGNOSIS — R059 Cough, unspecified: Secondary | ICD-10-CM | POA: Diagnosis not present

## 2023-07-15 DIAGNOSIS — R911 Solitary pulmonary nodule: Secondary | ICD-10-CM | POA: Diagnosis not present

## 2023-07-15 DIAGNOSIS — E1122 Type 2 diabetes mellitus with diabetic chronic kidney disease: Secondary | ICD-10-CM | POA: Diagnosis not present

## 2023-07-15 DIAGNOSIS — J449 Chronic obstructive pulmonary disease, unspecified: Secondary | ICD-10-CM | POA: Insufficient documentation

## 2023-07-15 DIAGNOSIS — N189 Chronic kidney disease, unspecified: Secondary | ICD-10-CM | POA: Diagnosis not present

## 2023-07-15 LAB — BASIC METABOLIC PANEL WITH GFR
Anion gap: 9 (ref 5–15)
BUN: 18 mg/dL (ref 8–23)
CO2: 22 mmol/L (ref 22–32)
Calcium: 8.8 mg/dL — ABNORMAL LOW (ref 8.9–10.3)
Chloride: 102 mmol/L (ref 98–111)
Creatinine, Ser: 1.08 mg/dL (ref 0.61–1.24)
GFR, Estimated: >60 mL/min (ref >60)
Glucose, Bld: 106 mg/dL — ABNORMAL HIGH (ref 70–99)
Potassium: 3.6 mmol/L (ref 3.5–5.1)
Sodium: 133 mmol/L — ABNORMAL LOW (ref 135–145)

## 2023-07-15 LAB — PROTIME-INR
INR: 1 (ref 0.8–1.2)
Prothrombin Time: 13.8 s (ref 11.4–15.2)

## 2023-07-15 LAB — CBC
HCT: 44.7 % (ref 39.0–52.0)
Hemoglobin: 15 g/dL (ref 13.0–17.0)
MCH: 29.6 pg (ref 26.0–34.0)
MCHC: 33.6 g/dL (ref 30.0–36.0)
MCV: 88.2 fL (ref 80.0–100.0)
Platelets: 225 10*3/uL (ref 150–400)
RBC: 5.07 MIL/uL (ref 4.22–5.81)
RDW: 14.5 % (ref 11.5–15.5)
WBC: 7.3 10*3/uL (ref 4.0–10.5)
nRBC: 0 % (ref 0.0–0.2)

## 2023-07-15 LAB — BLOOD GAS, VENOUS

## 2023-07-15 LAB — RESP PANEL BY RT-PCR (RSV, FLU A&B, COVID)  RVPGX2
Influenza A by PCR: NEGATIVE
Influenza B by PCR: NEGATIVE
Resp Syncytial Virus by PCR: NEGATIVE
SARS Coronavirus 2 by RT PCR: NEGATIVE

## 2023-07-15 LAB — TROPONIN I (HIGH SENSITIVITY): Troponin I (High Sensitivity): 8 ng/L (ref <18)

## 2023-07-15 LAB — BRAIN NATRIURETIC PEPTIDE: B Natriuretic Peptide: 35.9 pg/mL (ref 0.0–100.0)

## 2023-07-15 MED ORDER — IPRATROPIUM-ALBUTEROL 0.5-2.5 (3) MG/3ML IN SOLN
3.0000 mL | Freq: Once | RESPIRATORY_TRACT | Status: AC
  Administered 2023-07-15: 3 mL via RESPIRATORY_TRACT
  Filled 2023-07-15: qty 3

## 2023-07-15 MED ORDER — AMOXICILLIN-POT CLAVULANATE 875-125 MG PO TABS: 1.0000 | ORAL_TABLET | Freq: Two times a day (BID) | ORAL | 0 refills | Status: AC

## 2023-07-15 MED ORDER — AMOXICILLIN-POT CLAVULANATE 875-125 MG PO TABS
1.0000 | ORAL_TABLET | Freq: Once | ORAL | Status: DC
  Filled 2023-07-15: qty 1

## 2023-07-15 MED ORDER — IOHEXOL 350 MG/ML SOLN
100.0000 mL | Freq: Once | INTRAVENOUS | Status: AC | PRN
  Administered 2023-07-15: 75 mL via INTRAVENOUS

## 2023-07-15 NOTE — ED Provider Notes (Signed)
 [facility] Provider Note    Event Date/Time   First MD Initiated Contact with Patient 07/15/23 2016     (approximate)   History   Shortness of Breath  Patient to ED via POV for SOB. Ongoing for a couple weeks. Pt reports worse when talking. Hx lung cancer. Ambulatory with cane.   HPI Luke Rojas is a 78 y.o. 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 -No chest pain.  Endorses nonproductive cough.  No fevers.  No abdominal pain.  No lower extremity swelling.  On Plavix , no thinners.     Physical Exam   Triage Vital Signs: ED Triage Vitals  Encounter Vitals Group     BP 07/15/23 1758 132/73     Systolic BP Percentile --      Diastolic BP Percentile --      Pulse Rate 07/15/23 1758 (!) 105     Resp 07/15/23 1758 17     Temp 07/15/23 1758 97.6 F (36.4 C)     Temp Source 07/15/23 1758 Oral     SpO2 07/15/23 1758 98 %     Weight 07/15/23 1759 171 lb 15.3 oz (78 kg)     Height 07/15/23 1759 5\' 8"  (1.727 m)     Head Circumference --      Peak Flow --      Pain Score 07/15/23 1758 8     Pain Loc --      Pain Education --      Exclude from Growth Chart --     Most recent vital signs: Vitals:   07/15/23 1758 07/15/23 2054  BP: 132/73 138/82  Pulse: (!) 105 98  Resp: 17 18  Temp: 97.6 F (36.4 C)   SpO2: 98% 100%     General: Awake, tachypneic but nontoxic-appearing CV:  Good peripheral perfusion.  Mild tachycardia, regular rhythm, RP 2+.  No lower extremity edema. Resp:  Tachypneic to the upper 20s on my eval, good airflow throughout, possible trace and expiratory wheezing in left upper lung field.  No focal coarse breath sounds, no crackles. Abd:  No distention. Nontender to deep palpation  throughout    ED Results / Procedures / Treatments   Labs (all labs ordered are listed, but only abnormal results are displayed) Labs Reviewed  BASIC METABOLIC PANEL WITH GFR - Abnormal; Notable for the following components:      Result Value   Sodium 133 (*)    Glucose, Bld 106 (*)    Calcium  8.8 (*)    All other components within normal limits  BLOOD GAS, VENOUS - Abnormal; Notable for the following components:   pCO2, Ven 42 (*)    pO2, Ven <31 (*)    All other components within normal limits  RESP PANEL BY RT-PCR (RSV, FLU A&B, COVID)  RVPGX2  CBC  BRAIN NATRIURETIC PEPTIDE  PROTIME-INR  TROPONIN I (HIGH SENSITIVITY)  TROPONIN I (HIGH SENSITIVITY)     EKG  Ecg = sinus tachycardia, rate 102, no ST elevation depression, no significant repolarization abnormality, normal axis, normal intervals.  No evidence of ischemia nor arrhythmia on my read.   RADIOLOGY See ED course below   PROCEDURES:  Critical Care performed: No  Procedures   MEDICATIONS ORDERED IN ED: Medications  amoxicillin -clavulanate (AUGMENTIN ) 875-125 MG per tablet 1 tablet (has no administration in time range)  ipratropium-albuterol  (DUONEB) 0.5-2.5 (3) MG/3ML nebulizer solution 3 mL (3 mLs Nebulization Given 07/15/23 2136)  iohexol  (OMNIPAQUE ) 350 MG/ML injection 100 mL (75 mLs Intravenous Contrast Given 07/15/23 2208)     IMPRESSION / MDM / ASSESSMENT AND PLAN / ED COURSE  I reviewed the triage vital signs and the nursing notes.                              DDX/MDM/AP: Differential diagnosis includes, but is not limited to, pneumonia, ACS, CHF, recurrent malignancy, worsening pulmonary fibrosis, consider possibility of pulmonary embolism.  Certainly consider COPD exacerbation though exam not particularly suggestive of this, will trial DuoNeb.  Consider underlying viral syndrome including COVID/flu/RSV.  Plan: - Labs - Chest x-ray - EKG - DuoNeb -re-eval  Patient's presentation is most  consistent with acute presentation with potential threat to life or bodily function.  The patient is on the cardiac monitor to evaluate for evidence of arrhythmia and/or significant heart rate changes.  ED course below.  Chest x-ray with no obvious acute pathology though given degree of tachypnea and complex medical history I did escalate to CT PE to evaluate for other underlying pathology.  CT PE with possible left upper lobe pneumonia as well as enlarging nodule in the right upper lobe concerning for possible progression of prior malignancy.  Discussed findings with patient.  Started on Augmentin , plan for oncology and PMD follow-up.  Well-appearing here with improved breathing, remains satting well on room air and requesting discharge home.  ED return precautions  Clinical Course as of 07/15/23 2317  Mon Jul 15, 2023  2103 CBC unremarkable BMP reviewed, unremarkable [MM]  2104 Chest x-ray reviewed, no obvious acute pathology on my interpretation.  Radiology report reviewed. [MM]  2200 Patient reevaluated, not feeling any better after DuoNeb, will proceed with plan for CT [MM]  2227 VBG with no elevated pCO2, overall not consistent with COPD exacerbation [MM]  2310 Trop wnl [MM]  2315 CTA chest interpreted by myself and radiology report reviewed (below)  IMPRESSION: 1. No pulmonary embolus. 2. Interval increase in size of right upper lobe nodule, currently 4.9 x 4.3 cm, previously 2 x 2.6 cm, suspicious for disease progression. Surrounding masslike opacity in the right upper lobe as well as the adjacent superior segment of the right lower lobe with bronchiectasis and architectural distortion, likely post treatment related change. Recommend oncologic follow-up. PET may be of value. 3. Increased subcarinal lymph node. 4. Faint tree-in-bud opacity in the left upper lobe, likely infectious or inflammatory.  Aortic Atherosclerosis (ICD10-I70.0) and Emphysema (ICD10-J43.9).   [MM]  2316  Discussed with patient and family.  Has had increased cough and does have new tree-in-bud opacity in left upper lobe, will treat empirically for pneumonia with Augmentin .  No white count, no fever.  Satting well on room air.  With regard to right upper lobe lung nodule increasing in size, discussed concern for possible disease progression.  Patient agrees to follow-up closely with his oncologist for reevaluation and also encouraged him to follow-up with his primary care doctor in addition.  ED return precautions in place.  Patient and family agree with plan. [MM]    Clinical Course User Index [MM] Collis Deaner, MD     FINAL CLINICAL IMPRESSION(S) / ED DIAGNOSES   Final diagnoses:  Lung nodule  Shortness of breath  Pneumonia  of left upper lobe due to infectious organism     Rx / DC Orders   ED Discharge Orders          Ordered    amoxicillin -clavulanate (AUGMENTIN ) 875-125 MG tablet  2 times daily        07/15/23 2314             Note:  This document was prepared using Dragon voice recognition software and may include unintentional dictation errors.   Collis Deaner, MD 07/15/23 [phone]

## 2023-07-15 NOTE — Discharge Instructions (Signed)
 Your evaluation in the emergency department was notable for a possible pneumonia, and we have started you on antibiotics to treat this.  As discussed, you do have an enlarging lung nodule they should discuss further with your cancer doctor to ensure there is no recurrence of your cancer.  Please do follow-up with your primary care doctor and cancer doctor for reevaluation, and return to the emergency department with any new or worsening symptoms.

## 2023-07-15 NOTE — ED Triage Notes (Signed)
 Patient to ED via POV for SOB. Ongoing for a couple weeks. Pt reports worse when talking. Hx lung cancer. Ambulatory with cane.

## 2023-07-16 ENCOUNTER — Telehealth: Payer: Self-pay

## 2023-07-16 ENCOUNTER — Telehealth: Payer: Self-pay | Admitting: Oncology

## 2023-07-16 DIAGNOSIS — C3411 Malignant neoplasm of upper lobe, right bronchus or lung: Secondary | ICD-10-CM

## 2023-07-16 NOTE — Telephone Encounter (Signed)
 Cancel the ct we have scheduled. Please get pet scan asap

## 2023-07-16 NOTE — Telephone Encounter (Signed)
 patient went to ER last night and had a CT scan. The doctor there would like the scan to be looked at and let him know if he needs to be seen ASAP. His wife states he had a cough and just wanted to be checked out. Please let me know how to assist with this patient    [phone]

## 2023-07-19 ENCOUNTER — Ambulatory Visit
Admission: RE | Admit: 2023-07-19 | Discharge: 2023-07-19 | Disposition: A | Discharge status: Home / Self Care | Source: Ambulatory Visit | Attending: Oncology | Admitting: Oncology

## 2023-07-19 DIAGNOSIS — C3411 Malignant neoplasm of upper lobe, right bronchus or lung: Secondary | ICD-10-CM | POA: Diagnosis not present

## 2023-07-19 LAB — GLUCOSE, CAPILLARY: Glucose-Capillary: 101 mg/dL — ABNORMAL HIGH (ref 70–99)

## 2023-07-19 MED ORDER — FLUDEOXYGLUCOSE F - 18 (FDG) INJECTION
9.5600 | Freq: Once | INTRAVENOUS | Status: AC | PRN
  Administered 2023-07-19: 9.56 via INTRAVENOUS

## 2023-07-20 LAB — BLOOD GAS, VENOUS
Acid-Base Excess: 1 mmol/L (ref 0.0–2.0)
Bicarbonate: 26 mmol/L (ref 20.0–28.0)
O2 Saturation: 44.5 %
Patient temperature: 37
pCO2, Ven: 42 mmHg — ABNORMAL LOW (ref 44–60)
pH, Ven: 7.4 (ref 7.25–7.43)

## 2023-07-22 ENCOUNTER — Telehealth: Payer: Self-pay | Admitting: *Deleted

## 2023-07-22 ENCOUNTER — Other Ambulatory Visit: Payer: Self-pay | Admitting: Pulmonary Disease

## 2023-07-22 DIAGNOSIS — Z87891 Personal history of nicotine dependence: Secondary | ICD-10-CM | POA: Diagnosis not present

## 2023-07-22 DIAGNOSIS — J449 Chronic obstructive pulmonary disease, unspecified: Secondary | ICD-10-CM | POA: Diagnosis not present

## 2023-07-22 DIAGNOSIS — J700 Acute pulmonary manifestations due to radiation: Secondary | ICD-10-CM | POA: Diagnosis not present

## 2023-07-22 DIAGNOSIS — R530 Neoplastic (malignant) related fatigue: Secondary | ICD-10-CM | POA: Diagnosis not present

## 2023-07-22 DIAGNOSIS — C3411 Malignant neoplasm of upper lobe, right bronchus or lung: Secondary | ICD-10-CM | POA: Diagnosis not present

## 2023-07-22 NOTE — Telephone Encounter (Signed)
 The wife of the patient wanted to make sure that there was not a CT chest abdomen and pelvis for May 20, 25.  I spoke to Dr. Randy Buttery and I spoke to the wife and told her that since he had a PET scan on May 16 he does not need to have that CT chest abdomen pelvis and I have canceled it in the system.  And they will be seeing Dr. Randy Buttery on June 3.  The wife is okay with all of that information above

## 2023-07-23 ENCOUNTER — Ambulatory Visit: Payer: Self-pay | Admitting: Oncology

## 2023-07-23 ENCOUNTER — Inpatient Hospital Stay: Admitting: Family Medicine

## 2023-07-23 ENCOUNTER — Other Ambulatory Visit: Payer: Medicare Other

## 2023-07-23 ENCOUNTER — Ambulatory Visit: Payer: Medicare Other

## 2023-07-23 ENCOUNTER — Encounter (INDEPENDENT_AMBULATORY_CARE_PROVIDER_SITE_OTHER): Payer: Self-pay

## 2023-07-23 ENCOUNTER — Inpatient Hospital Stay: Attending: Oncology

## 2023-07-23 DIAGNOSIS — Z85118 Personal history of other malignant neoplasm of bronchus and lung: Secondary | ICD-10-CM | POA: Insufficient documentation

## 2023-07-23 DIAGNOSIS — C3411 Malignant neoplasm of upper lobe, right bronchus or lung: Secondary | ICD-10-CM

## 2023-07-23 LAB — CBC WITH DIFFERENTIAL/PLATELET
Abs Immature Granulocytes: 0.06 10*3/uL (ref 0.00–0.07)
Basophils Absolute: 0.1 10*3/uL (ref 0.0–0.1)
Basophils Relative: 1 %
Eosinophils Absolute: 0.3 10*3/uL (ref 0.0–0.5)
Eosinophils Relative: 4 %
HCT: 42.2 % (ref 39.0–52.0)
Hemoglobin: 14.6 g/dL (ref 13.0–17.0)
Immature Granulocytes: 1 %
Lymphocytes Relative: 15 %
Lymphs Abs: 1.2 10*3/uL (ref 0.7–4.0)
MCH: 29.9 pg (ref 26.0–34.0)
MCHC: 34.6 g/dL (ref 30.0–36.0)
MCV: 86.3 fL (ref 80.0–100.0)
Monocytes Absolute: 1 10*3/uL (ref 0.1–1.0)
Monocytes Relative: 12 %
Neutro Abs: 5.8 10*3/uL (ref 1.7–7.7)
Neutrophils Relative %: 67 %
Platelets: 249 10*3/uL (ref 150–400)
RBC: 4.89 MIL/uL (ref 4.22–5.81)
RDW: 14.5 % (ref 11.5–15.5)
WBC: 8.4 10*3/uL (ref 4.0–10.5)
nRBC: 0 % (ref 0.0–0.2)

## 2023-07-23 LAB — COMPREHENSIVE METABOLIC PANEL WITH GFR
ALT: 42 U/L (ref 0–44)
AST: 26 U/L (ref 15–41)
Albumin: 3.5 g/dL (ref 3.5–5.0)
Alkaline Phosphatase: 70 U/L (ref 38–126)
Anion gap: 10 (ref 5–15)
BUN: 17 mg/dL (ref 8–23)
CO2: 20 mmol/L — ABNORMAL LOW (ref 22–32)
Calcium: 8.4 mg/dL — ABNORMAL LOW (ref 8.9–10.3)
Chloride: 103 mmol/L (ref 98–111)
Creatinine, Ser: 1.07 mg/dL (ref 0.61–1.24)
GFR, Estimated: >60 mL/min (ref >60)
Glucose, Bld: 131 mg/dL — ABNORMAL HIGH (ref 70–99)
Potassium: 3.3 mmol/L — ABNORMAL LOW (ref 3.5–5.1)
Sodium: 133 mmol/L — ABNORMAL LOW (ref 135–145)
Total Bilirubin: 0.7 mg/dL (ref 0.0–1.2)
Total Protein: 7.4 g/dL (ref 6.5–8.1)

## 2023-07-24 ENCOUNTER — Other Ambulatory Visit: Payer: Self-pay

## 2023-07-24 ENCOUNTER — Telehealth: Payer: Self-pay | Admitting: *Deleted

## 2023-07-24 DIAGNOSIS — E876 Hypokalemia: Secondary | ICD-10-CM

## 2023-07-24 MED ORDER — POTASSIUM CHLORIDE CRYS ER 20 MEQ PO TBCR: 20.0000 meq | EXTENDED_RELEASE_TABLET | Freq: Every day | ORAL | 0 refills | Status: AC

## 2023-07-24 NOTE — Telephone Encounter (Signed)
 Patient's potassium was low and Dr. Randy Buttery had told Luke Rojas to get in touch with them so that they could go and pick up the potassium pills that she he only has to take 1 a day for 7 days.  The wife called back saying that she does not have a voice mail.  She did say if you call you should call her [phone] because that comes up on their TV when the phone rings.  She will go to the pharmacy and get the potassium

## 2023-07-25 ENCOUNTER — Encounter: Payer: Self-pay | Admitting: Oncology

## 2023-07-25 NOTE — Telephone Encounter (Signed)
 PET order already processed.  No additional follow up at this time.

## 2023-07-26 ENCOUNTER — Encounter
Admission: RE | Admit: 2023-07-26 | Discharge: 2023-07-26 | Disposition: A | Discharge status: Home / Self Care | Source: Ambulatory Visit | Attending: Pulmonary Disease | Admitting: Pulmonary Disease

## 2023-07-26 ENCOUNTER — Other Ambulatory Visit: Payer: Self-pay

## 2023-07-26 ENCOUNTER — Encounter: Payer: Self-pay | Admitting: Urgent Care

## 2023-07-26 DIAGNOSIS — E119 Type 2 diabetes mellitus without complications: Secondary | ICD-10-CM

## 2023-07-26 HISTORY — DX: Dyspnea, unspecified: R06.00

## 2023-07-26 HISTORY — DX: Hypokalemia: E87.6

## 2023-07-26 HISTORY — DX: Pneumonia, unspecified organism: J18.9

## 2023-07-26 NOTE — Patient Instructions (Addendum)
 Your procedure is scheduled on: 08/02/23 - Friday Report to the Registration Desk on the 1st floor of the [facility]. To find out your arrival time, please call [phone] between 1PM - 3PM on: 08/01/23 - Thursday If your arrival time is 6:00 am, do not arrive before that time as the [facility] entrance doors do not open until 6:00 am.  REMEMBER: Instructions that are not followed completely may result in serious medical risk, up to and including death; or upon the discretion of your surgeon and anesthesiologist your surgery may need to be rescheduled.  Do not eat food or drink any liquids after midnight the night before surgery.  No gum chewing or hard candies.   One week prior to surgery: Stop Anti-inflammatories (NSAIDS) such as Advil, Aleve , Ibuprofen, Motrin, Naproxen , Naprosyn  and Aspirin  based products such as Excedrin, Goody's Powder, BC Powder. You may take Tylenol  if needed for pain up until the day of surgery.  Stop ANY OVER THE COUNTER supplements until after surgery.  Continue taking all of your other prescription medications up until the day before surgery.  HOLD Plavix  beginning 07/26/23.  ON THE DAY OF SURGERY ONLY TAKE THESE MEDICATIONS WITH SIPS OF WATER:  metoprolol  tartrate (LOPRESSOR )  pantoprazole  (PROTONIX )    No Alcohol for 24 hours before or after surgery.  No Smoking including e-cigarettes for 24 hours before surgery.  No chewable tobacco products for at least 6 hours before surgery.  No nicotine patches on the day of surgery.  Do not use any "recreational" drugs for at least a week (preferably 2 weeks) before your surgery.  Please be advised that the combination of cocaine and anesthesia may have negative outcomes, up to and including death. If you test positive for cocaine, your surgery will be cancelled.  On the morning of surgery brush your teeth with toothpaste and water, you may rinse your mouth with mouthwash if you wish. Do not swallow  any toothpaste or mouthwash.   Do not wear jewelry, make-up, hairpins, clips or nail polish.  For welded (permanent) jewelry: bracelets, anklets, waist bands, etc.  Please have this removed prior to surgery.  If it is not removed, there is a chance that hospital personnel will need to cut it off on the day of surgery.  Do not wear lotions, powders, or perfumes.   Do not shave body hair from the neck down 48 hours before surgery.  Contact lenses, hearing aids and dentures may not be worn into surgery.  Do not bring valuables to the hospital. [facility] is not responsible for any missing/lost belongings or valuables.   Notify your doctor if there is any change in your medical condition (cold, fever, infection).  Wear comfortable clothing (specific to your surgery type) to the hospital.  After surgery, you can help prevent lung complications by doing breathing exercises.  Take deep breaths and cough every 1-2 hours. Your doctor may order a device called an Incentive Spirometer to help you take deep breaths.  When coughing or sneezing, hold a pillow firmly against your incision with both hands. This is called "splinting." Doing this helps protect your incision. It also decreases belly discomfort.  If you are being admitted to the hospital overnight, leave your suitcase in the car. After surgery it may be brought to your room.  In case of increased patient census, it may be necessary for you, the patient, to continue your postoperative care in the Same Day Surgery department.  If you are being  discharged the day of surgery, you will not be allowed to drive home. You will need a responsible individual to drive you home and stay with you for 24 hours after surgery.   If you are taking public transportation, you will need to have a responsible individual with you.  Please call the Pre-admissions Testing Dept. at [phone] if you have any questions about these instructions.  Surgery  Visitation Policy:  Patients having surgery or a procedure may have two visitors.  Children under the age of 79 must have an adult with them who is not the patient.  Inpatient Visitation:    Visiting hours are 7 a.m. to 8 p.m. Up to four visitors are allowed at one time in a patient room. The visitors may rotate out with other people during the day.  One visitor age 58 or older may stay with the patient overnight and must be in the room by 8 p.m.

## 2023-08-01 ENCOUNTER — Encounter: Payer: Self-pay | Admitting: Urgent Care

## 2023-08-01 NOTE — Progress Notes (Signed)
 Perioperative / Anesthesia Services  Pre-Admission Testing Clinical Review / Pre-Operative Anesthesia Consult  Date: 08/01/23  PATIENT DEMOGRAPHICS: Name: Luke Rojas DOB: [birth year] MRN:   [id number]  Note: Available PAT nursing documentation and vital signs have been reviewed. Clinical nursing staff has updated patient's PMH/PSHx, current medication list, and drug allergies/intolerances to ensure complete and comprehensive history available to assist care teams in MDM as it pertains to the aforementioned surgical procedure and anticipated anesthetic course. Extensive review of available clinical information personally performed. [place] [facility] and PSHx updated with any diagnoses/procedures that  may have been inadvertently omitted during his intake with the pre-admission testing department's nursing staff.  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 male who is submitted for pre-surgical anesthesia review and clearance prior to him undergoing the above procedure. Patient is a Former Smoker (30 pack years; quit 03/1984). Pertinent PMH includes: CAD, NSTEMI, BILATERAL carotid artery disease, aortic atherosclerosis, HTN, HLD, T2DM, CKD, COPD, RIGHT upper lobe lung cancer, GERD (on daily PPI), nephrolithiasis.  Patient is followed by cardiology Beau Bound, MD). He was last seen in the cardiology clinic on ***; notes reviewed. ***At the time of his clinic visit, patient doing well overall from a cardiovascular perspective. Patient  denied any chest pain, shortness of breath, PND, orthopnea, palpitations, significant peripheral edema, weakness, fatigue, vertiginous symptoms, or presyncope/syncope. Patient with a past medical history significant for cardiovascular diagnoses. Documented physical exam was grossly benign, providing no evidence of acute exacerbation and/or decompensation of the patient's known cardiovascular conditions.  ***  Blood pressure***controlled at *** mmHg on currently prescribed *** therapies.  Patient is on *** for his HLD diagnosis and ASCVD prevention. ***Patient is not diabetic. ***He does not have an OSAH diagnosis. ***FC. No changes were made to his medication regimen during his visit with cardiology.  Patient scheduled to follow-up with outpatient cardiology in***months or sooner if needed.  Luke Rojas is scheduled for an elective BRONCHOSCOPY, WITH EBUS VIDEO BRONCHOSCOPY WITH ENDOBRONCHIAL NAVIGATION on 08/02/2023 with Dr. Erskin Hearing, MD.  Given patient's past medical history significant for cardiovascular diagnoses, presurgical cardiac clearance was sought by the PAT team. Per cardiology, "this patient is optimized for surgery and may proceed with the planned procedural course with a LOW risk of significant perioperative cardiovascular complications".  Again, this patient is on daily chronic antithrombotic therapy. He has been instructed on recommendations for holding his clopidogrel  for 7 days prior to his procedure with plans to restart as soon as postoperative bleeding risk felt to be minimized by his primary attending surgeon. The patient has been instructed that his last dose of should be on 07/25/2023.  Patient denies previous perioperative complications with anesthesia in the past. In review his EMR, it is noted that patient underwent a general anesthetic course here at [facility] (ASA III) in 02/2020 without documented complications.    MOST  RECENT VITAL SIGNS:    07/15/2023   11:00 PM 07/15/2023    9:00 PM  07/15/2023    8:54 PM  Vitals with BMI  Systolic 148 151 956  Diastolic 83 85 82  Pulse 113 101 98   PROVIDERS/SPECIALISTS: NOTE: Primary physician provider listed below. Patient may have been seen by APP or partner within same practice.   PROVIDER ROLE / SPECIALTY LAST Luke Halsted, MD Pulmonary Medicine (Surgeon) 07/22/2023  Mazie Speed, MD Primary Care Provider 05/30/2023  Thais Fill, MD Cardiology 10/16/2022  Seretha Dance, MD Medical Oncology 01/22/2023  Glenis Langdon, MD Radiation Oncology 12/25/2021   ALLERGIES: Allergies  Allergen Reactions   Codeine Nausea And Vomiting and Nausea Only   CURRENT HOME MEDICATIONS: No current facility-administered medications for this encounter.    atorvastatin  (LIPITOR) 40 MG tablet   celecoxib  (CELEBREX ) 100 MG capsule   cetirizine  (ZYRTEC ) 10 MG tablet   clopidogrel  (PLAVIX ) 75 MG tablet   co-enzyme Q-10 30 MG capsule   Cranberry-Vitamin C-Probiotic (AZO CRANBERRY PO)   diclofenac  sodium (VOLTAREN ) 1 % GEL   Krill Oil 1000 MG CAPS   metoprolol  tartrate (LOPRESSOR ) 25 MG tablet   MULTIPLE VITAMIN PO   nitroGLYCERIN  (NITROSTAT ) 0.4 MG SL tablet   OneTouch Delica Lancets 33G MISC   pantoprazole  (PROTONIX ) 40 MG tablet   potassium chloride  SA (KLOR-CON  M) 20 MEQ tablet   ramipril  (ALTACE ) 5 MG capsule   HISTORY: Past Medical History:  Diagnosis Date   Aortic atherosclerosis (HCC)    Arthritis    Bilateral carotid artery disease (HCC)    a.) s/p PTA/stenting 09/11/2021: 9 mm x 7 mm x 4 cm Exact stent to RIGHT   Chronic kidney disease    COPD (chronic obstructive pulmonary disease) (HCC)    Coronary artery disease    a.) PCI 12/11/2010: 99% pRCA (3 x 15 mm Xience V DES); b.) staged PCI 01/05/2011: 80 % pLCx (3.5 x 23 mm Xience V DES); c.) PCI 12/19/2018: 75% ISR pRCA (3 x 16 mm Synergy DES), 85% mRCA (3 x 20 mm Synergy DES), 95% mRCA (3 x  16 mm Synergy), 85% p-mLAD (rotational athrectomy + overlapping 3 x 24 mm and 3 x 28 mm Synergy DES)   DDD (degenerative disc disease), cervical    Dyspnea    Full dentures    GERD (gastroesophageal reflux disease)    History of heart artery stent 12/11/2010   a.) TOTAL of 7 coronary stents as of 08/01/2023   Hypercholesteremia    Hypertension    Hypokalemia    Long term current use of clopidogrel     Nephrolithiasis    NSTEMI (non-ST elevated myocardial infarction) (HCC) 12/10/2010   a.) PCI 12/11/2010: 99% pRCA (3 x 15 mm Xience V DES); b.) staged PCI 01/05/2011: 80 % pLCx (3.5 x 23 mm Xience V DES)   On long term clopidogrel  therapy    Pneumonia    Renal colic    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   a.) stage II B (cT2, cN1, cM0) s/p concurrent chemoradiation (weekly CarboTaxol + 70 cGy over 7 weeks; completed 12/2021)   T2DM (type 2 diabetes mellitus) (HCC)    Past Surgical History:  Procedure Laterality Date   CARDIAC CATHETERIZATION  2013   2 stents   CAROTID PTA/STENT INTERVENTION Right 09/11/2021   Procedure: CAROTID PTA/STENT INTERVENTION;  Surgeon: Celso College, MD;  Location: [facility] INVASIVE CV LAB;  Service: Cardiovascular;  Laterality: Right;   CATARACT EXTRACTION, BILATERAL     COLONOSCOPY  04/27/05   COLONOSCOPY WITH  PROPOFOL  N/A 06/08/2015   Procedure: COLONOSCOPY WITH PROPOFOL ;  Surgeon: Marshall Skeeter, MD;  Location: [facility] ENDOSCOPY;  Service: Endoscopy;  Laterality: N/A;   CYSTOSCOPY WITH INSERTION OF UROLIFT N/A 02/11/2020   Procedure: CYSTOSCOPY WITH INSERTION OF UROLIFT;  Surgeon: Rea Cambridge, MD;  Location: [facility] ORS;  Service: Urology;  Laterality: N/A;   CYSTOSCOPY WITH STENT PLACEMENT Right 09/16/2016   Procedure: CYSTOSCOPY WITH STENT PLACEMENT;  Surgeon: Rea Cambridge, MD;  Location: [facility] ORS;  Service: Urology;  Laterality: Right;   EXTRACORPOREAL SHOCK WAVE LITHOTRIPSY Left 11/04/2014   has had 2 previous lithotripsies    EXTRACORPOREAL SHOCK WAVE LITHOTRIPSY Left 03/24/2015   Procedure: EXTRACORPOREAL SHOCK WAVE LITHOTRIPSY (ESWL);  Surgeon: Rea Cambridge, MD;  Location: [facility] ORS;  Service: Urology;  Laterality: Left;   EXTRACORPOREAL SHOCK WAVE LITHOTRIPSY Right 10/04/2016   Procedure: EXTRACORPOREAL SHOCK WAVE LITHOTRIPSY (ESWL);  Surgeon: Rea Cambridge, MD;  Location: [facility] ORS;  Service: Urology;  Laterality: Right;   LEFT HEART CATH AND CORONARY ANGIOGRAPHY N/A 12/05/2018   Procedure: LEFT HEART CATH AND CORONARY ANGIOGRAPHY with possible pci and stent;  Surgeon: Antonette Batters, MD;  Location: [facility] INVASIVE CV LAB;  Service: Cardiovascular;  Laterality: N/A;   PORTA CATH INSERTION Right 09/25/2021   Procedure: PORTA CATH INSERTION;  Surgeon: Celso College, MD;  Location: [facility] INVASIVE CV LAB;  Service: Cardiovascular;  Laterality: Right;   Family History  Problem Relation Age of Onset   Pulmonary embolism Mother    Transient ischemic attack Mother    Diabetes Mother    Pancreatic cancer Father    Hypertension Father    Diabetes Father    Cirrhosis Brother    Social History   Tobacco Use   Smoking status: Former    Current packs/day: 0.00    Average packs/day: 1 pack/day for 30.0 years (30.0 ttl pk-yrs)    Types: Cigarettes    Start date: 03/05/1954    Quit date: 03/05/1984    Years since quitting: 39.4   Smokeless tobacco: Current    Types: Chew   Tobacco comments:    Chews tobacco daily.  Substance Use Topics   Alcohol use: No    Alcohol/week: 0.0 standard drinks of alcohol   LABS:  Lab Results  Component Value Date   WBC 8.4 07/23/2023   HGB 14.6 07/23/2023   HCT 42.2 07/23/2023   MCV 86.3 07/23/2023   PLT 249 07/23/2023   Lab Results  Component Value Date   NA 133 (L) 07/23/2023   CL 103 07/23/2023   K 3.3 (L) 07/23/2023   CO2 20 (L) 07/23/2023   BUN 17 07/23/2023   CREATININE 1.07 07/23/2023   GFRNONAA >60 07/23/2023   CALCIUM  8.4 (L) 07/23/2023   ALBUMIN 3.5 07/23/2023    GLUCOSE 131 (H) 07/23/2023    ECG: Date: 07/15/2023  Time ECG obtained: 1802 PM Rate: 102 bpm Rhythm: sinus tachycardia Axis (leads I and aVF): normal Intervals: PR 154 ms. QRS 72 ms. QTc 414 ms. ST segment and T wave changes: Nonspecific lateral T wave abnormalities  Evidence of a possible, age undetermined, prior infarct:  No   Comparison: Similar to previous tracing obtained on 10/08/2022   IMAGING / PROCEDURES: VAS US  CAROTID performed on 01/15/2023 There is no evidence of stenosis in the right ICA. Patent RICA stent.  Velocities in the left ICA are consistent with a 1-39% stenosis. Bilateral vertebral arteries demonstrate antegrade flow.  Normal flow hemodynamics were seen in bilateral  subclavian arteries.   TRANSTHORACIC ECHOCARDIOGRAM performed on 02/04/2019 Normal left ventricular systolic function with an EF of >55% No regional wall motion abnormalities Left ventricular diastolic Doppler parameters consistent with abnormal relaxation (G1DD). Right ventricular size and function normal Mild mitral annular calcification Trivial AR, MR, and PR Mild TR Normal gradients; no valvular stenosis  HIGH RISK PCI performed on 12/19/2018 Successful PCI of 75% ISR of proximal RCA with 3x16 mm Synergy bioabsorbable DES with 0% residual stenosis.  Successful PCI of 85% mid RCA with 3x20 mm Synergy bioabsorbable DES with 0% residual stenosis.  Successful PCI of 95% mid RCA with 3x16 mm Synergy bioabsorbable DES with 0% residual stenosis.  Rotablation atherectomy of heavily calcified diffusely diseased proximal and mid LAD (85%) with 1.75 mm RotoPro  Successful IVUS guided PCI of proximal and mid LAD with overlapping 3x49mm and 3x40mm Synergy bioabsorbable DES with 0% residual stenosis.  Post PCI IVUS confirms excellent stent apposition and expansion with MLA 7.00 mm^2.   LEFT HEART CATHETERIZATION AND CORONARY ANGIOGRAPHY performed on 12/05/2018 Multivessel coronary artery  disease Prox RCA lesion is 80% stenosed. Mid RCA lesion is 70% stenosed. Dist RCA lesion is 90% stenosed. Ost LAD to Prox LAD lesion is 50% stenosed. Mid LAD lesion is 75% stenosed. Previously placed Prox Cx to Dist Cx stent (unknown type) is widely patent Widely patent left main mild to moderate calcium  LAD extensive calcification moderate to severe 50% mid and proximal lesion 70% focal mid lesion Circumflex large widely patent mid stent no significant obstructive disease RCA medium size multiple lesions proximal mid distal widely patent proximal stent Successful IFR of mid LAD lesion measuring 0.83 Defer intervention today. Recommend referral to tertiary care center for evaluation of possible high risk multivessel complex intervention versus coronary bypass surgery. Case referred to [facility] and discussed with Dr. Jeanine Millers   MYOCARDIAL PERFUSION IMAGING STUDY (LEXISCAN ) performed on 02/12/2018 No reduced low ventricular systolic function with an EF of 51% Mild inferior hypokinesis No artifact No ventricular cavity size normal SPECT images demonstrate a small perfusion abnormality of mild intensity present in the inferior region on stress images consistent with probable scar TID ratio = 1.02 Intermediate risk scan.  Recommend conservative medical therapy for now.   IMPRESSION AND PLAN: RASMUS PREUSSER has been referred for pre-anesthesia review and clearance prior to him undergoing the planned anesthetic and procedural courses. Available labs, pertinent testing, and imaging results were personally reviewed by me in preparation for upcoming operative/procedural course. [facility] medical record has been updated following extensive record review and patient interview with PAT staff.   ATTENTION --> PENDING CLEARANCE AT THIS TIME -- NOTE/CONTENTS NOT FINAL UNTIL SIGNED This patient has been appropriately cleared by cardiology with an overall *** risk of patient experiencing significant  perioperative cardiovascular complications. Based on clinical review performed today (08/01/23), barring any significant acute changes in the patient's overall condition, it is anticipated that he will be able to proceed with the planned surgical intervention. Any acute changes in clinical condition may necessitate his procedure being postponed and/or cancelled. Patient will meet with anesthesia team (MD and/or CRNA) on the day of his procedure for preoperative evaluation/assessment. Questions regarding anesthetic course will be fielded at that time.   Pre-surgical instructions were reviewed with the patient during his PAT appointment, and questions were fielded to satisfaction by PAT clinical staff. He has been instructed on which medications that he will need to hold prior to surgery, as well as the ones that have been deemed  safe/appropriate to take on the day of his procedure. As part of the general education provided by PAT, patient made aware both verbally and in writing, that he would need to abstain from the use of any illegal substances during his perioperative course. He was advised that failure to follow the provided instructions could necessitate case cancellation or result in serious perioperative complications up to and including death. Patient encouraged to contact PAT and/or his surgeon's office to discuss any questions or concerns that may arise prior to surgery; verbalized understanding.   Renate Caroline, MSN, APRN, FNP-C, CEN [facility]  Perioperative Services Nurse Practitioner Phone: [phone] Fax: [phone] 08/01/23 11:20 AM  NOTE: This note has been prepared using Dragon dictation software. Despite my best ability to proofread, there is always the potential that unintentional transcriptional errors may still occur from this process.

## 2023-08-02 ENCOUNTER — Encounter
Admission: RE | Disposition: A | Discharge status: Home / Self Care | Payer: Self-pay | Source: Home / Self Care | Attending: Pulmonary Disease

## 2023-08-02 ENCOUNTER — Ambulatory Visit: Admitting: Urgent Care

## 2023-08-02 ENCOUNTER — Ambulatory Visit

## 2023-08-02 ENCOUNTER — Encounter: Payer: Self-pay | Admitting: Urgent Care

## 2023-08-02 ENCOUNTER — Ambulatory Visit
Admission: RE | Admit: 2023-08-02 | Discharge: 2023-08-02 | Disposition: A | Discharge status: Home / Self Care | Attending: Pulmonary Disease | Admitting: Pulmonary Disease

## 2023-08-02 ENCOUNTER — Other Ambulatory Visit: Payer: Self-pay

## 2023-08-02 DIAGNOSIS — I700 Atherosclerosis of aorta: Secondary | ICD-10-CM | POA: Diagnosis not present

## 2023-08-02 DIAGNOSIS — E785 Hyperlipidemia, unspecified: Secondary | ICD-10-CM | POA: Insufficient documentation

## 2023-08-02 DIAGNOSIS — Z955 Presence of coronary angioplasty implant and graft: Secondary | ICD-10-CM | POA: Insufficient documentation

## 2023-08-02 DIAGNOSIS — Z833 Family history of diabetes mellitus: Secondary | ICD-10-CM | POA: Insufficient documentation

## 2023-08-02 DIAGNOSIS — J9811 Atelectasis: Secondary | ICD-10-CM | POA: Diagnosis not present

## 2023-08-02 DIAGNOSIS — N189 Chronic kidney disease, unspecified: Secondary | ICD-10-CM | POA: Insufficient documentation

## 2023-08-02 DIAGNOSIS — E1122 Type 2 diabetes mellitus with diabetic chronic kidney disease: Secondary | ICD-10-CM | POA: Diagnosis not present

## 2023-08-02 DIAGNOSIS — C3411 Malignant neoplasm of upper lobe, right bronchus or lung: Secondary | ICD-10-CM | POA: Diagnosis not present

## 2023-08-02 DIAGNOSIS — I129 Hypertensive chronic kidney disease with stage 1 through stage 4 chronic kidney disease, or unspecified chronic kidney disease: Secondary | ICD-10-CM | POA: Diagnosis not present

## 2023-08-02 DIAGNOSIS — Z87891 Personal history of nicotine dependence: Secondary | ICD-10-CM | POA: Insufficient documentation

## 2023-08-02 DIAGNOSIS — I2510 Atherosclerotic heart disease of native coronary artery without angina pectoris: Secondary | ICD-10-CM | POA: Diagnosis not present

## 2023-08-02 DIAGNOSIS — Z8249 Family history of ischemic heart disease and other diseases of the circulatory system: Secondary | ICD-10-CM | POA: Diagnosis not present

## 2023-08-02 DIAGNOSIS — K219 Gastro-esophageal reflux disease without esophagitis: Secondary | ICD-10-CM | POA: Insufficient documentation

## 2023-08-02 DIAGNOSIS — I252 Old myocardial infarction: Secondary | ICD-10-CM | POA: Insufficient documentation

## 2023-08-02 DIAGNOSIS — Z7902 Long term (current) use of antithrombotics/antiplatelets: Secondary | ICD-10-CM | POA: Insufficient documentation

## 2023-08-02 DIAGNOSIS — Z48813 Encounter for surgical aftercare following surgery on the respiratory system: Secondary | ICD-10-CM | POA: Diagnosis not present

## 2023-08-02 DIAGNOSIS — E119 Type 2 diabetes mellitus without complications: Secondary | ICD-10-CM

## 2023-08-02 DIAGNOSIS — R918 Other nonspecific abnormal finding of lung field: Secondary | ICD-10-CM | POA: Diagnosis not present

## 2023-08-02 DIAGNOSIS — R590 Localized enlarged lymph nodes: Secondary | ICD-10-CM | POA: Diagnosis not present

## 2023-08-02 HISTORY — PX: VIDEO BRONCHOSCOPY WITH ENDOBRONCHIAL ULTRASOUND: SHX6177

## 2023-08-02 HISTORY — DX: Diverticulosis of intestine, part unspecified, without perforation or abscess without bleeding: K57.90

## 2023-08-02 HISTORY — DX: Disorder of arteries and arterioles, unspecified: I77.9

## 2023-08-02 HISTORY — PX: VIDEO BRONCHOSCOPY WITH ENDOBRONCHIAL NAVIGATION: SHX6175

## 2023-08-02 HISTORY — DX: Long term (current) use of antithrombotics/antiplatelets: Z79.02

## 2023-08-02 HISTORY — DX: Atherosclerosis of aorta: I70.0

## 2023-08-02 HISTORY — DX: Other cervical disc degeneration, unspecified cervical region: M50.30

## 2023-08-02 HISTORY — DX: Calculus of kidney: N20.0

## 2023-08-02 HISTORY — DX: Benign prostatic hyperplasia without lower urinary tract symptoms: N40.0

## 2023-08-02 HISTORY — DX: Type 2 diabetes mellitus without complications: E11.9

## 2023-08-02 HISTORY — DX: Long term (current) use of anticoagulants: Z79.01

## 2023-08-02 HISTORY — DX: Complete loss of teeth, unspecified cause, unspecified class: K08.109

## 2023-08-02 LAB — GLUCOSE, CAPILLARY
Glucose-Capillary: 102 mg/dL — ABNORMAL HIGH (ref 70–99)
Glucose-Capillary: 98 mg/dL (ref 70–99)

## 2023-08-02 SURGERY — BRONCHOSCOPY, WITH EBUS
Anesthesia: General

## 2023-08-02 MED ORDER — PROPOFOL 10 MG/ML IV BOLUS
INTRAVENOUS | Status: AC
  Filled 2023-08-02: qty 20

## 2023-08-02 MED ORDER — ROCURONIUM BROMIDE 100 MG/10ML IV SOLN
INTRAVENOUS | Status: DC | PRN
  Administered 2023-08-02: 50 mg via INTRAVENOUS

## 2023-08-02 MED ORDER — ONDANSETRON HCL 4 MG/2ML IJ SOLN
INTRAMUSCULAR | Status: DC | PRN
  Administered 2023-08-02: 4 mg via INTRAVENOUS

## 2023-08-02 MED ORDER — ONDANSETRON HCL 4 MG/2ML IJ SOLN
INTRAMUSCULAR | Status: AC
  Filled 2023-08-02: qty 2

## 2023-08-02 MED ORDER — LIDOCAINE HCL 4 % MT SOLN
OROMUCOSAL | Status: DC | PRN
  Administered 2023-08-02: 4 mL via TOPICAL

## 2023-08-02 MED ORDER — FENTANYL CITRATE (PF) 100 MCG/2ML IJ SOLN
INTRAMUSCULAR | Status: AC
  Filled 2023-08-02: qty 2

## 2023-08-02 MED ORDER — LIDOCAINE HCL (PF) 2 % IJ SOLN
INTRAMUSCULAR | Status: AC
  Filled 2023-08-02: qty 5

## 2023-08-02 MED ORDER — PROPOFOL 1000 MG/100ML IV EMUL
INTRAVENOUS | Status: AC
Start: 2023-08-02 — End: ?
  Filled 2023-08-02: qty 300

## 2023-08-02 MED ORDER — LIDOCAINE HCL (CARDIAC) PF 100 MG/5ML IV SOSY
PREFILLED_SYRINGE | INTRAVENOUS | Status: DC | PRN
Start: 2023-08-02 — End: 2023-08-02
  Administered 2023-08-02: 100 mg via INTRAVENOUS

## 2023-08-02 MED ORDER — SODIUM CHLORIDE 0.9 % IV SOLN: INTRAVENOUS | Status: DC

## 2023-08-02 MED ORDER — PHENYLEPHRINE 80 MCG/ML (10ML) SYRINGE FOR IV PUSH (FOR BLOOD PRESSURE SUPPORT)
PREFILLED_SYRINGE | INTRAVENOUS | Status: DC | PRN
  Administered 2023-08-02: 80 ug via INTRAVENOUS
  Administered 2023-08-02: 160 ug via INTRAVENOUS
  Administered 2023-08-02 (×3): 80 ug via INTRAVENOUS

## 2023-08-02 MED ORDER — IPRATROPIUM-ALBUTEROL 0.5-2.5 (3) MG/3ML IN SOLN
3.0000 mL | RESPIRATORY_TRACT | Status: DC
  Administered 2023-08-02: 3 mL via RESPIRATORY_TRACT

## 2023-08-02 MED ORDER — SUGAMMADEX SODIUM 200 MG/2ML IV SOLN
INTRAVENOUS | Status: DC | PRN
  Administered 2023-08-02: 200 mg via INTRAVENOUS

## 2023-08-02 MED ORDER — PHENYLEPHRINE 80 MCG/ML (10ML) SYRINGE FOR IV PUSH (FOR BLOOD PRESSURE SUPPORT)
PREFILLED_SYRINGE | INTRAVENOUS | Status: AC
  Filled 2023-08-02: qty 10

## 2023-08-02 MED ORDER — ORAL CARE MOUTH RINSE: 15.0000 mL | Freq: Once | OROMUCOSAL | Status: AC

## 2023-08-02 MED ORDER — FENTANYL CITRATE (PF) 100 MCG/2ML IJ SOLN
INTRAMUSCULAR | Status: DC | PRN
  Administered 2023-08-02: 50 ug via INTRAVENOUS

## 2023-08-02 MED ORDER — MIDAZOLAM HCL 2 MG/2ML IJ SOLN
INTRAMUSCULAR | Status: AC
  Filled 2023-08-02: qty 2

## 2023-08-02 MED ORDER — PROPOFOL 10 MG/ML IV BOLUS
INTRAVENOUS | Status: DC | PRN
  Administered 2023-08-02: 150 ug/kg/min via INTRAVENOUS

## 2023-08-02 MED ORDER — DEXAMETHASONE SODIUM PHOSPHATE 10 MG/ML IJ SOLN
INTRAMUSCULAR | Status: AC
  Filled 2023-08-02: qty 1

## 2023-08-02 MED ORDER — IPRATROPIUM-ALBUTEROL 0.5-2.5 (3) MG/3ML IN SOLN
RESPIRATORY_TRACT | Status: AC
  Filled 2023-08-02: qty 3

## 2023-08-02 MED ORDER — DEXAMETHASONE SODIUM PHOSPHATE 10 MG/ML IJ SOLN
INTRAMUSCULAR | Status: DC | PRN
  Administered 2023-08-02: 10 mg via INTRAVENOUS

## 2023-08-02 MED ORDER — LACTATED RINGERS IV SOLN: INTRAVENOUS | Status: DC

## 2023-08-02 MED ORDER — CHLORHEXIDINE GLUCONATE 0.12 % MT SOLN
15.0000 mL | Freq: Once | OROMUCOSAL | Status: AC
  Administered 2023-08-02: 15 mL via OROMUCOSAL

## 2023-08-02 MED ORDER — CHLORHEXIDINE GLUCONATE 0.12 % MT SOLN
OROMUCOSAL | Status: AC
  Filled 2023-08-02: qty 15

## 2023-08-02 NOTE — Transfer of Care (Signed)
 Immediate Anesthesia Transfer of Care Note  Patient: Luke Rojas  Procedure(s) Performed: BRONCHOSCOPY, WITH EBUS VIDEO BRONCHOSCOPY WITH ENDOBRONCHIAL NAVIGATION  Patient Location: PACU  Anesthesia Type:General  Level of Consciousness: drowsy  Airway & Oxygen Therapy: Patient Spontanous Breathing and Patient connected to face mask oxygen  Post-op Assessment: Report given to RN, Post -op Vital signs reviewed and stable, and Patient moving all extremities  Post vital signs: Reviewed and stable  Last Vitals:  Vitals Value Taken Time  BP 100/59 08/02/23 1250  Temp 36.7 C 08/02/23 1249  Pulse 73 08/02/23 1256  Resp 20 08/02/23 1256  SpO2 99 % 08/02/23 1256  Vitals shown include unfiled device data.  Last Pain:  Vitals:   08/02/23 1249  TempSrc:   PainSc: Asleep         Complications: There were no known notable events for this encounter.

## 2023-08-02 NOTE — Anesthesia Procedure Notes (Signed)
 Procedure Name: Intubation Date/Time: 08/02/2023 11:24 AM  Performed by: Lourena Royal, RNPre-anesthesia Checklist: Patient identified, Suction available, Patient being monitored, Emergency Drugs available and Timeout performed Patient Re-evaluated:Patient Re-evaluated prior to induction Oxygen Delivery Method: Circle system utilized Preoxygenation: Pre-oxygenation with 100% oxygen Induction Type: IV induction Ventilation: Mask ventilation without difficulty Laryngoscope Size: McGrath and 4 Grade View: Grade I Tube type: Oral Tube size: 8.0 mm Number of attempts: 1 Airway Equipment and Method: Stylet and Video-laryngoscopy Placement Confirmation: ETT inserted through vocal cords under direct vision, positive ETCO2 and breath sounds checked- equal and bilateral Secured at: 21 cm Tube secured with: Tape Dental Injury: Teeth and Oropharynx as per pre-operative assessment

## 2023-08-02 NOTE — Anesthesia Postprocedure Evaluation (Signed)
 Anesthesia Post Note  Patient: Luke Rojas  Procedure(s) Performed: BRONCHOSCOPY, WITH EBUS VIDEO BRONCHOSCOPY WITH ENDOBRONCHIAL NAVIGATION  Patient location during evaluation: PACU Anesthesia Type: General Level of consciousness: awake and alert, oriented and patient cooperative Pain management: pain level controlled Vital Signs Assessment: post-procedure vital signs reviewed and stable Respiratory status: spontaneous breathing, nonlabored ventilation and respiratory function stable Cardiovascular status: blood pressure returned to baseline and stable Postop Assessment: adequate PO intake Anesthetic complications: no   There were no known notable events for this encounter.   Last Vitals:  Vitals:   08/02/23 1330 08/02/23 1345  BP: 107/62 106/71  Pulse: 78 81  Resp: (!) 24 (!) 21  Temp: (!) 36.3 C   SpO2: 96% 96%    Last Pain:  Vitals:   08/02/23 1322  TempSrc:   PainSc: 0-No pain                 Dorothey Gate

## 2023-08-02 NOTE — Procedures (Signed)
 ROBOTIC NAVIGATIONAL BRONCHOSCOPY PROCEDURE NOTE  FIBEROPTIC BRONCHOSCOPY WITH THERAPEUTIC ASPIRATION OF TRACHEOBRONCHIAL TREE AND BRONCHOALVEOLAR LAVAGE PROCEDURE NOTE  ENDOBRONCHIAL ULTRASOUND >/=3LYMPH NODES PROCEDURE NOTE    Flexible bronchoscopy was performed  by : Jaclynn Mast MD  assistance by : 1)Repiratory therapist  and 2) cytotech staff and 3) Anesthesia team and 4) Flouroscopy team and 5) [employer] supporting staff   Indication for the procedure was :  Pre-procedural H&P. The following assessment was performed on the day of the procedure prior to initiating sedation History:  Chest pain n Dyspnea y Hemoptysis n Cough y Fever n Other pertinent items n  Examination Vital signs -reviewed as per nursing documentation today Cardiac    Murmurs: n  Rubs : n  Gallop: n Lungs Wheezing: n Rales : n Rhonchi :y  Other pertinent findings: SOB/hypoxemia due to chronic lung disease   Pre-procedural assessment for Procedural Sedation included: Depth of sedation: As per anesthesia team  ASA Classification:  2 Mallampati airway assessment: 3    Medication list reviewed: y  The patient's interval history was taken and revealed: no new complaints The pre- procedure physical examination revealed: No new findings Refer to prior clinic note for details.  Informed Consent: Informed consent was obtained from:  patient after explanation of procedure and risks, benefits, as well as alternative procedures available.  Explanation of level of sedation and possible transfusion was also provided.    Procedural Preparation: Time out was performed and patient was identified by name and birthdate and procedure to be performed and side for sampling, if any, was specified. Pt was intubated by anesthesia.  The patient was appropriately draped.   Fiberoptic bronchoscopy with airway inspection and therapeutic aspiration of tracheobronchial tree and  BAL Procedure findings:  Bronchoscope  was inserted via ETT  without difficulty.  Posterior oropharynx, epiglottis, arytenoids, false cords and vocal cords were not visualized as these were bypassed by endotracheal tube. The distal trachea was normal in circumference and appearance without mucosal, cartilaginous or branching abnormalities.  The main carina was mildly splayed . Mucus plugging was noted bilaterally worse at right upper lobe which was evacuated with therapeutic aspiration of tracheobronchial tree The mucosa was : friable at RUL  Airways were notable for:        exophytic lesions :n       extrinsic compression in the following distributions: n.       Friable mucosa: y       Teacher, music /pigmentation: yes    Mucus plugging noted bilaterally worse at RUL which was evacuated via therapeutic aspiration.   Post procedure Diagnosis:   Mucus plugging of tracheobronchial tree     Robotic Navigational Bronchoscopy Procedure Findings:   Post appropriate planning and registration peripheral navigation was used to visualize target lesion.    Target 1 - RUL transbronchial FNA x 5 - lesional carcinoma,  BAL at right upper lobe was performed   Post procedure diagnosis: lung cancer       Endobronchial ultrasound assisted hilar and mediastinal lymph node biopsies procedure findings: The fiberoptic bronchoscope was removed and the EBUS scope was introduced. Examination began to evaluate for pathologically enlarged lymph nodes starting on the Left  side progressing to the right side.  All lymph node biopsies performed with 22g needle. Lymph node biopsies were sent in cytolite for all stations.  Station 10L - 1.0 cm - 3 biopsies Station 7 - 1.8cm - 3 biopsies Station 4R - 1.6cm - 3 biopsies   Post procedure  diagnosis:  lymphadenopathy suggestive of metastasis   Specimens obtained included:   Immediate sampling complications included:NONE  Epinephrine  ZERO ml was used topically  The bronchoscopy was terminated  due to completion of the planned procedure and the bronchoscope was removed.   Total dosage of Lidocaine  was ZERO mg Total fluoroscopy time was AS PER RADIOLOGY  minutes  Supplemental oxygen was provided at AS PER ANESTHESIA  lpm by nasal canula post operatively  Estimated Blood loss: EXPECTED < 5cc.  Complications included:  NONE IMMEDIATE   Preliminary CXR findings :  IN PROCESS   Disposition: HOME WITH FAMILY   Follow up with Dr. Helon Wisinski in 5 days for result discussion.     Erskin Hearing MD  [facility] Division of Pulmonary & Critical Care Medicine

## 2023-08-02 NOTE — H&P (Signed)
 PULMONOLOGY         Date: 08/02/2023,   MRN# [id number] Luke Rojas [birth year]     AdmissionWeight: 80.3 kg                 CurrentWeight: 80.3 kg  Referring provider: Dr Randy Buttery   CHIEF COMPLAINT:   Right upper lobe hypermetabolic mass with mediastinal lymphadenopathy   HISTORY OF PRESENT ILLNESS   This is a 78 yo M with hx of COPD, CAD, NSTEMI, OA, HTN, hx of right upper lobe lung ca seen by cardiology.  He is s/p therapy and post surveillance imaging had increased size notable on right upper lobe lung lesion. PET scan was done with findings of hypermetabolic mass with hilar and mediastinal lymphadenopathy.  Patient is here today for bronchoscopy with lung biopsy and endobronchial ultrasound for lymph node biopsy. We will first peform flexible bronchoscopy with therapeutic aspiration of tracheobronchial tree to aspirate mucus plugging then proceed with robotic bronchoscopy and ebus.  Reviewed risks/complications and benefits with patient, risks include infection, pneumothorax/pneumomediastinum which may require chest tube placement as well as overnight/prolonged hospitalization and possible mechanical ventilation. Other risks include bleeding and very rarely death.  Patient understands risks and wishes to proceed.  Additional questions were answered, and patient is aware that post procedure patient will be going home with family and may experience cough with possible clots on expectoration as well as phlegm which may last few days as well as hoarseness of voice post intubation and mechanical ventilation.    PAST MEDICAL HISTORY   Past Medical History:  Diagnosis Date   Aortic atherosclerosis (HCC)    Arthritis    Bilateral carotid artery disease (HCC)    a.) s/p PTA/stenting 09/11/2021: 9 mm x 7 mm x 4 cm Exact stent to RIGHT   BPH (benign prostatic hyperplasia)    Chronic kidney disease    COPD (chronic obstructive pulmonary disease) (HCC)    Coronary artery  disease    a.) PCI 12/11/2010: 99% pRCA (3 x 15 mm Xience V DES); b.) staged PCI 01/05/2011: 80 % pLCx (3.5 x 23 mm Xience V DES); c.) PCI 12/19/2018: 75% ISR pRCA (3 x 16 mm Synergy DES), 85% mRCA (3 x 20 mm Synergy DES), 95% mRCA (3 x 16 mm Synergy), 85% p-mLAD (rotational athrectomy + overlapping 3 x 24 mm and 3 x 28 mm Synergy DES)   DDD (degenerative disc disease), cervical    Diverticulosis    Dyspnea    Full dentures    GERD (gastroesophageal reflux disease)    History of heart artery stent 12/11/2010   a.) TOTAL of 7 coronary stents as of 08/01/2023   Hypercholesteremia    Hypertension    Hypokalemia    Long term current use of clopidogrel     Nephrolithiasis    NSTEMI (non-ST elevated myocardial infarction) (HCC) 12/10/2010   a.) PCI 12/11/2010: 99% pRCA (3 x 15 mm Xience V DES); b.) staged PCI 01/05/2011: 80 % pLCx (3.5 x 23 mm Xience V DES)   On long term clopidogrel  therapy    Pneumonia    Renal colic    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   a.) stage II B (cT2, cN1, cM0) s/p concurrent chemoradiation (weekly CarboTaxol + 70 cGy over 7 weeks; completed 12/2021)   T2DM (type 2 diabetes mellitus) (HCC)      SURGICAL HISTORY   Past Surgical History:  Procedure Laterality Date  CAROTID PTA/STENT INTERVENTION Right 09/11/2021   Procedure: CAROTID PTA/STENT INTERVENTION;  Surgeon: Celso College, MD;  Location: [facility];  Service: Cardiovascular;  Laterality: Right;   CATARACT EXTRACTION, BILATERAL     COLONOSCOPY  04/27/2005   COLONOSCOPY WITH PROPOFOL  N/A 06/08/2015   Procedure: COLONOSCOPY WITH PROPOFOL ;  Surgeon: Marshall Skeeter, MD;  Location: [facility];  Service: Endoscopy;  Laterality: N/A;   CORONARY ANGIOPLASTY WITH STENT PLACEMENT Left 12/11/2010   Procedure: CORONARY ANGIOPLASTY WITH STENT PLACEMENT; Location: [facility]; Surgeon: Thais Fill, MD   CORONARY ANGIOPLASTY WITH STENT PLACEMENT Left 12/19/2018   Procedure:  CORONARY ANGIOPLASTY WITH STENT PLACEMENT; Location: [facility]   CORONARY STENT INTERVENTION Left 01/05/2011   Procedure: CORONARY STENT INTERVENTION; Location: [facility]; Surgeon: Thais Fill, MD   CYSTOSCOPY WITH INSERTION OF UROLIFT N/A 02/11/2020   Procedure: CYSTOSCOPY WITH INSERTION OF UROLIFT;  Surgeon: Rea Cambridge, MD;  Location: [facility];  Service: Urology;  Laterality: N/A;   CYSTOSCOPY WITH STENT PLACEMENT Right 09/16/2016   Procedure: CYSTOSCOPY WITH STENT PLACEMENT;  Surgeon: Rea Cambridge, MD;  Location: [facility];  Service: Urology;  Laterality: Right;   EXTRACORPOREAL SHOCK WAVE LITHOTRIPSY Left 11/04/2014   has had 2 previous lithotripsies   EXTRACORPOREAL SHOCK WAVE LITHOTRIPSY Left 03/24/2015   Procedure: EXTRACORPOREAL SHOCK WAVE LITHOTRIPSY (ESWL);  Surgeon: Rea Cambridge, MD;  Location: [facility];  Service: Urology;  Laterality: Left;   EXTRACORPOREAL SHOCK WAVE LITHOTRIPSY Right 10/04/2016   Procedure: EXTRACORPOREAL SHOCK WAVE LITHOTRIPSY (ESWL);  Surgeon: Rea Cambridge, MD;  Location: [facility];  Service: Urology;  Laterality: Right;   LEFT HEART CATH AND CORONARY ANGIOGRAPHY N/A 12/05/2018   Procedure: LEFT HEART CATH AND CORONARY ANGIOGRAPHY with possible pci and stent;  Surgeon: Antonette Batters, MD;  Location: [facility];  Service: Cardiovascular;  Laterality: N/A;   PORTA CATH INSERTION Right 09/25/2021   Procedure: PORTA CATH INSERTION;  Surgeon: Celso College, MD;  Location: [facility];  Service: Cardiovascular;  Laterality: Right;     FAMILY HISTORY   Family History  Problem Relation Age of Onset   Pulmonary embolism Mother    Transient ischemic attack Mother    Diabetes Mother    Pancreatic cancer Father    Hypertension Father    Diabetes Father    Cirrhosis Brother      SOCIAL HISTORY   Social History   Tobacco Use   Smoking status: Former    Current packs/day: 0.00    Average packs/day: 1 pack/day for 30.0 years  (30.0 ttl pk-yrs)    Types: Cigarettes    Start date: 03/05/1954    Quit date: 03/05/1984    Years since quitting: 39.4   Smokeless tobacco: Current    Types: Chew   Tobacco comments:    Chews tobacco daily.  Vaping Use   Vaping status: Never Used  Substance Use Topics   Alcohol use: No    Alcohol/week: 0.0 standard drinks of alcohol   Drug use: No     MEDICATIONS     Current Medication:  Current Facility-Administered Medications:    0.9 %  sodium chloride  infusion, , Intravenous, Continuous, Carleah Yablonski, MD, Last Rate: 100 mL/hr at 08/02/23 0910, New Bag at 08/02/23 0910   lactated ringers  infusion, , Intravenous, Continuous, Enrique Harvest, MD    ALLERGIES   Codeine     REVIEW OF SYSTEMS    Review of Systems:  Gen:  Denies  fever, sweats, chills weigh  loss  HEENT: Denies blurred vision, double vision, ear pain, eye pain, hearing loss, nose bleeds, sore throat Cardiac:  No dizziness, chest pain or heaviness, chest tightness,edema Resp:   reports dyspnea chronically  Gi: Denies swallowing difficulty, stomach pain, nausea or vomiting, diarrhea, constipation, bowel incontinence Gu:  Denies bladder incontinence, burning urine Ext:   Denies Joint pain, stiffness or swelling Skin: Denies  skin rash, easy bruising or bleeding or hives Endoc:  Denies polyuria, polydipsia , polyphagia or weight change Psych:   Denies depression, insomnia or hallucinations   Other:  All other systems negative   VS: BP (!) 125/94   Pulse 90   Temp (!) 97.4 F (36.3 C) (Temporal)   Resp 16   Ht 5\' 8"  (1.727 m)   Wt 80.3 kg   SpO2 95%   BMI 26.91 kg/m      PHYSICAL EXAM    GENERAL:NAD, no fevers, chills, no weakness no fatigue HEAD: Normocephalic, atraumatic.  EYES: Pupils equal, round, reactive to light. Extraocular muscles intact. No scleral icterus.  MOUTH: Moist mucosal membrane. Dentition intact. No abscess noted.  EAR, NOSE, THROAT: Clear without exudates. No  external lesions.  NECK: Supple. No thyromegaly. No nodules. No JVD.  PULMONARY: decreased breath sounds with mild rhonchi worse at bases bilaterally.  CARDIOVASCULAR: S1 and S2. Regular rate and rhythm. No murmurs, rubs, or gallops. No edema. Pedal pulses 2+ bilaterally.  GASTROINTESTINAL: Soft, nontender, nondistended. No masses. Positive bowel sounds. No hepatosplenomegaly.  MUSCULOSKELETAL: No swelling, clubbing, or edema. Range of motion full in all extremities.  NEUROLOGIC: Cranial nerves II through XII are intact. No gross focal neurological deficits. Sensation intact. Reflexes intact.  SKIN: No ulceration, lesions, rashes, or cyanosis. Skin warm and dry. Turgor intact.  PSYCHIATRIC: Mood, affect within normal limits. The patient is awake, alert and oriented x 3. Insight, judgment intact.       IMAGING   -reviewed recent CT and PET scan   ASSESSMENT/PLAN   Right upper lobe hypermetabolic mass with hilar and mediastinal lymphadenopathy   - plan for flexible bronchoscopy followed by therapeutic aspiration of tracheobronchial tree followed by RANB and EBUS    -Reviewed risks/complications and benefits with patient, risks include infection, pneumothorax/pneumomediastinum which may require chest tube placement as well as overnight/prolonged hospitalization and possible mechanical ventilation. Other risks include bleeding and very rarely death.  Patient understands risks and wishes to proceed.  Additional questions were answered, and patient is aware that post procedure patient will be going home with family and may experience cough with possible clots on expectoration as well as phlegm which may last few days as well as hoarseness of voice post intubation and mechanical ventilation.             Thank you for allowing me to participate in the care of this patient.   Patient/Family are satisfied with care plan and all questions have been answered.    Provider  disclosure: Patient with at least one acute or chronic illness or injury that poses a threat to life or bodily function and is being managed actively during this encounter.  All of the below services have been performed independently by signing provider:  review of prior documentation from internal and or external health records.  Review of previous and current lab results.  Interview and comprehensive assessment during patient visit today. Review of current and previous chest radiographs/CT scans. Discussion of management and test interpretation with health care team and patient/family.   This document was  prepared using Conservation officer, historic buildings and may include unintentional dictation errors.     Chamya Hunton, M.D.  Division of Pulmonary & Critical Care Medicine

## 2023-08-02 NOTE — Anesthesia Preprocedure Evaluation (Addendum)
 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)     Comment:  a.) stage II B (cT2, cN1, cM0) s/p concurrent               chemoradiation (weekly CarboTaxol + 70 cGy over 7 weeks;               completed 12/2021)   Pulmonary exam normal        Cardiovascular hypertension, + CAD, + Past MI, + Cardiac Stents and + Peripheral Vascular Disease (Bilateral carotid artery disease (HCC)     Comment:  a.) s/p PTA/stenting, Exact stent to RIGHT)  Normal cardiovascular exam   r CAD, s/p PCI/stent to proximal to mid RCA (unknown dates), s/p PCI/DES to in-stent restenosis of proximal RCA, s/p PCI/DES x2 to mid RCA, s/p rotablation atherectomy of heavily calcified diffusely diseased proximal to mid LAD s/p successful IVUS guided PCI of the proximal to mid LAD with overlapping DES x2 (12/2018 at [facility]), carotid artery stenosis s/p right ICA stent (09/11/21)   Neuro/Psych negative neurological ROS  negative psych ROS   GI/Hepatic negative GI ROS, Neg liver ROS,,,  Endo/Other  diabetes, Type 2    Renal/GU Renal InsufficiencyRenal disease     Musculoskeletal   Abdominal Normal abdominal exam  (+)   Peds  Hematology negative hematology ROS (+)   Anesthesia Other Findings Past Medical History: No date: Aortic atherosclerosis (HCC) No date: Arthritis No date: Bilateral carotid artery disease (HCC)     Comment:  a.) s/p PTA/stenting 09/11/2021: 9 mm x 7 mm x 4 cm               Exact stent to RIGHT No date: BPH (benign prostatic hyperplasia) No date: Chronic kidney  disease No date: COPD (chronic obstructive pulmonary disease) (HCC) No date: Coronary artery disease     Comment:  a.) PCI 12/11/2010: 99% pRCA (3 x 15 mm Xience V DES);               b.) staged PCI 01/05/2011: 80 % pLCx (3.5 x 23 mm Xience               V DES); c.) PCI 12/19/2018: 75% ISR pRCA (3 x 16 mm               Synergy DES), 85% mRCA (3 x 20 mm Synergy DES), 95% mRCA               (3 x 16 mm Synergy), 85% p-mLAD (rotational athrectomy +               overlapping 3 x 24 mm and 3 x 28 mm Synergy DES) No date: DDD (degenerative disc disease), cervical No date: Diverticulosis No date: Dyspnea No date: Full dentures No date: GERD (gastroesophageal reflux disease) 12/11/2010: History of heart artery stent     Comment:  a.) TOTAL of 7 coronary stents as of 08/01/2023 No date: Hypercholesteremia No date: Hypertension No date: Hypokalemia No date: Long term current use of clopidogrel  No date: Nephrolithiasis 12/10/2010: NSTEMI (non-ST elevated myocardial infarction) (  HCC)     Comment:  a.) PCI 12/11/2010: 99% pRCA (3 x 15 mm Xience V DES);               b.) staged PCI 01/05/2011: 80 % pLCx (3.5 x 23 mm Xience               V DES) No date: On long term clopidogrel  therapy No date: Pneumonia No date: Renal colic 09/2021: 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)     Comment:  a.) stage II B (cT2, cN1, cM0) s/p concurrent               chemoradiation (weekly CarboTaxol + 70 cGy over 7 weeks;               completed 12/2021) No date: T2DM (type 2 diabetes mellitus) (HCC)  Past Surgical History: 09/11/2021: CAROTID PTA/STENT INTERVENTION; Right     Comment:  Procedure: CAROTID PTA/STENT INTERVENTION;  Surgeon:               Celso College, MD;  Location: [facility];                Service: Cardiovascular;  Laterality: Right; No date: CATARACT EXTRACTION, BILATERAL 04/27/2005: COLONOSCOPY 06/08/2015: COLONOSCOPY WITH PROPOFOL ; N/A     Comment:  Procedure:  COLONOSCOPY WITH PROPOFOL ;  Surgeon: Marshall Skeeter, MD;  Location: [facility];  Service:               Endoscopy;  Laterality: N/A; 12/11/2010: CORONARY ANGIOPLASTY WITH STENT PLACEMENT; Left     Comment:  Procedure: CORONARY ANGIOPLASTY WITH STENT PLACEMENT;               Location: [facility]; Surgeon: Thais Fill, MD 12/19/2018: CORONARY ANGIOPLASTY WITH STENT PLACEMENT; Left     Comment:  Procedure: CORONARY ANGIOPLASTY WITH STENT PLACEMENT;               Location: [facility] 01/05/2011: CORONARY STENT INTERVENTION; Left     Comment:  Procedure: CORONARY STENT INTERVENTION; Location: [facility];               Surgeon: Thais Fill, MD 02/11/2020: CYSTOSCOPY WITH INSERTION OF UROLIFT; N/A     Comment:  Procedure: CYSTOSCOPY WITH INSERTION OF UROLIFT;                Surgeon: Rea Cambridge, MD;  Location: [facility];                Service: Urology;  Laterality: N/A; 09/16/2016: CYSTOSCOPY WITH STENT PLACEMENT; Right     Comment:  Procedure: CYSTOSCOPY WITH STENT PLACEMENT;  Surgeon:               Rea Cambridge, MD;  Location: [facility];  Service:               Urology;  Laterality: Right; 11/04/2014: EXTRACORPOREAL SHOCK WAVE LITHOTRIPSY; Left     Comment:  has had 2 previous lithotripsies 03/24/2015: EXTRACORPOREAL SHOCK WAVE LITHOTRIPSY; Left     Comment:  Procedure: EXTRACORPOREAL SHOCK WAVE LITHOTRIPSY (ESWL);              Surgeon: Rea Cambridge, MD;  Location: [facility];                Service: Urology;  Laterality: Left; 10/04/2016: EXTRACORPOREAL SHOCK WAVE LITHOTRIPSY; Right  Comment:  Procedure: EXTRACORPOREAL SHOCK WAVE LITHOTRIPSY (ESWL);              Surgeon: Rea Cambridge, MD;  Location: [facility];                Service: Urology;  Laterality: Right; 12/05/2018: LEFT HEART CATH AND CORONARY ANGIOGRAPHY; N/A     Comment:  Procedure: LEFT HEART CATH AND CORONARY ANGIOGRAPHY with              possible pci and stent;  Surgeon: Antonette Batters, MD;               Location: [facility];  Service: Cardiovascular;              Laterality: N/A; 09/25/2021: PORTA CATH INSERTION; Right     Comment:  Procedure: PORTA CATH INSERTION;  Surgeon: Celso College,              MD;  Location: [facility];  Service:               Cardiovascular;  Laterality: Right;     Reproductive/Obstetrics negative OB ROS                              Anesthesia Physical Anesthesia Plan  ASA: 3  Anesthesia Plan: General ETT   Post-op Pain Management:    Induction: Intravenous  PONV Risk Score and Plan: 2 and Ondansetron  and Dexamethasone   Airway Management Planned: Oral ETT  Additional Equipment:   Intra-op Plan:   Post-operative Plan: Extubation in OR  Informed Consent: I have reviewed the patients History and Physical, chart, labs and discussed the procedure including the risks, benefits and alternatives for the proposed anesthesia with the patient or authorized representative who has indicated his/her understanding and acceptance.     Dental Advisory Given  Plan Discussed with: CRNA and Surgeon  Anesthesia Plan Comments:          Anesthesia Quick Evaluation

## 2023-08-03 ENCOUNTER — Encounter: Payer: Self-pay | Admitting: Pulmonary Disease

## 2023-08-05 ENCOUNTER — Ambulatory Visit: Admitting: Family Medicine

## 2023-08-05 ENCOUNTER — Ambulatory Visit: Payer: Self-pay

## 2023-08-05 ENCOUNTER — Encounter: Payer: Self-pay | Admitting: Internal Medicine

## 2023-08-05 ENCOUNTER — Ambulatory Visit (INDEPENDENT_AMBULATORY_CARE_PROVIDER_SITE_OTHER): Admitting: Internal Medicine

## 2023-08-05 ENCOUNTER — Other Ambulatory Visit: Payer: Self-pay

## 2023-08-05 VITALS — BP 128/76 | HR 96 | Temp 98.4°F | Resp 16 | Ht 64.0 in | Wt 168.9 lb

## 2023-08-05 DIAGNOSIS — H6123 Impacted cerumen, bilateral: Secondary | ICD-10-CM

## 2023-08-05 NOTE — Progress Notes (Signed)
   Acute Office Visit  Subjective:     Patient ID: Luke Rojas, male    DOB: March 12, 1945, 78 y.o.   MRN: [id number]  Chief Complaint  Patient presents with   Ear Fullness    HPI Patient is in today for ear fullness and hearing changes.   Discussed the use of AI scribe software for clinical note transcription with the patient, who gave verbal consent to proceed.  Discussed the use of AI scribe software for clinical note transcription with the patient, who gave verbal consent to proceed.  History of Present Illness Luke Rojas "Raenette Bumps" is a 78 year old male who presents with hearing changes and ear fullness.  He experiences hearing changes and a sensation of fullness in his ears for the past few days. He cannot hear out of his right ear and feels his left ear is also becoming blocked. There is a sensation of pressure, particularly when burping, but no significant pain. His wife used a small amount of peroxide in his ears, causing fizzing without wax removal. No upper respiratory symptoms such as congestion or sinus pain are present. He recalls a previous ear cleaning procedure using a device to shoot water into the ear.   Review of Systems  Constitutional:  Negative for chills and fever.  HENT:  Positive for hearing loss. Negative for congestion, ear discharge, ear pain, sore throat and tinnitus.         Objective:    BP 128/76 (Cuff Size: Normal)   Pulse 96   Temp 98.4 F (36.9 C) (Oral)   Resp 16   Ht 5\' 4"  (1.626 m)   Wt 168 lb 14.4 oz (76.6 kg)   SpO2 98%   BMI 28.99 kg/m  BP Readings from Last 3 Encounters:  08/05/23 128/76  08/02/23 137/87  07/15/23 (!) 148/83   Wt Readings from Last 3 Encounters:  08/05/23 168 lb 14.4 oz (76.6 kg)  08/02/23 177 lb (80.3 kg)  07/15/23 171 lb 15.3 oz (78 kg)      Physical Exam Constitutional:      Appearance: Normal appearance.  HENT:     Head: Normocephalic and atraumatic.     Right Ear: Ear canal and  external ear normal. There is impacted cerumen.     Left Ear: Ear canal and external ear normal. There is impacted cerumen.  Eyes:     Conjunctiva/sclera: Conjunctivae normal.  Skin:    General: Skin is warm and dry.  Neurological:     General: No focal deficit present.     Mental Status: He is alert.  Psychiatric:        Mood and Affect: Mood normal.        Behavior: Behavior normal.     No results found for any visits on 08/05/23.      Assessment & Plan:   Assessment & Plan Impacted cerumen Bilateral impacted cerumen causing hearing changes, significant wax buildup, especially on the right. No pain. - Perform ear irrigation with warm water and hydrogen peroxide to remove wax; was able to remove some wax but not all, still unable to view bilateral tympanic membranes. - Recommend OCT Debrox drops, if not successful can place referral to ENT.    - Ear Lavage  Return if symptoms worsen or fail to improve.  Rockney Cid, DO

## 2023-08-05 NOTE — Telephone Encounter (Signed)
  Chief Complaint: ear congestion  Symptoms: L>R, hearing loss Frequency: x 1 week Pertinent Negatives: Patient denies fever, CP, SOB Disposition: [] ED /[] Urgent Care (no appt availability in office) / [x] Appointment(In office/virtual)/ []  [facility]/ [] Home Care/ [] Refused Recommended Disposition /[] [facility]/ []  Follow-up with PCP Additional Notes: Pt c/o bilateral ear congestion L > R x 1 week. Pt reports "needing ears cleaned out" and has tried peroxide at home with no relief. Pt does endorse chronic coughing but denies CP, SOB. Scheduled patient per protocol on August 05, 2023 with alternate provider. Patient verbalized understanding and to call back PRN.        Copied from CRM [id number]. Topic: Clinical - Red Word Triage >> Aug 05, 2023  8:32 AM Marissa P wrote: Red Word that prompted transfer to Nurse Triage: Patient called in regards to getting his ears cleaned out please, says its been a while since he asked about it but needs it cleaned out.  Would like to get it done today if possible. Pain in ear when coughing, his left ear. Would like if he can get some help cleaning both of his ears today for some relief please Reason for Disposition  Ear congestion present > 48 hours  Answer Assessment - Initial Assessment Questions 1. LOCATION: "Which ear is involved?"       Bilateral, L > R 2. SENSATION: "Describe how the ear feels." (e.g. stuffy, full, plugged)."      "I can't hear nothing out of it" 3. ONSET:  "When did the ear symptoms start?"       X 1 week 4. PAIN: "Do you also have an earache?" If Yes, ask: "How bad is it?" (Scale 1-10; or mild, moderate, severe)     "Aggravating bc I can't hear out of it" 5. CAUSE: "What do you think is causing the ear congestion?"     "Needs cleaning out" 6. URI: "Do you have a runny nose or cough?"      Chronic cough 7. NASAL ALLERGIES: "Are there symptoms of hay fever, such as sneezing or a clear nasal discharge?"      N/a 8. PREGNANCY: "Is there any chance you are pregnant?" "When was your last menstrual period?"     N/a  Protocols used: Ear - Congestion-A-AH

## 2023-08-06 ENCOUNTER — Inpatient Hospital Stay: Payer: Medicare Other | Admitting: Oncology

## 2023-08-06 LAB — CYTOLOGY - NON PAP

## 2023-08-07 ENCOUNTER — Encounter: Payer: Self-pay | Admitting: *Deleted

## 2023-08-07 ENCOUNTER — Inpatient Hospital Stay: Attending: Oncology | Admitting: Oncology

## 2023-08-07 DIAGNOSIS — Z08 Encounter for follow-up examination after completed treatment for malignant neoplasm: Secondary | ICD-10-CM | POA: Diagnosis not present

## 2023-08-07 DIAGNOSIS — C3490 Malignant neoplasm of unspecified part of unspecified bronchus or lung: Secondary | ICD-10-CM

## 2023-08-07 DIAGNOSIS — Z85118 Personal history of other malignant neoplasm of bronchus and lung: Secondary | ICD-10-CM | POA: Diagnosis not present

## 2023-08-07 DIAGNOSIS — C3411 Malignant neoplasm of upper lobe, right bronchus or lung: Secondary | ICD-10-CM

## 2023-08-07 NOTE — Progress Notes (Signed)
 Order for PDL1 on recent lung biopsy ([id number]) faxed to [facility].

## 2023-08-07 NOTE — Progress Notes (Signed)
 Hematology/Oncology Consult note [facility]  [phone] [phone]  Patient Care Team: Mazie Speed, MD as PCP - General (Family Medicine) Arminda Berth, OD as Consulting Physician (Optometry) Antonette Batters, MD as Consulting Physician (Cardiology) Drake Gens, RN as Oncology Nurse Navigator [name], Kizzie Perks, MD (Urology) Avonne Boettcher, MD as Consulting Physician (Oncology) Erskin Hearing, MD as Consulting Physician (Pulmonary Disease) Vonna Guardian Donald Frost, MD as Referring Physician (Vascular Surgery) Glenis Langdon, MD as Consulting Physician (Radiation Oncology)   Name of the patient: Luke Rojas  [id number]  [birth year]   Date of visit: 08/07/23  Diagnosis-  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  He quit smoking about 30 years ago but still chews tobacco.  He presented to the ER with symptoms of neck pain which led to an MRI cervical spine as well as CT angio chest.  MRI cervical spine showed degenerative changes along with moderate spinal stenosis and mass effect between C3-C4 and C4-C5.  Foraminal stenosis at C3-C4 C5-C6-C7 nerve levels.  Nonspecific 10 mm T3 vertebral body lesion indeterminate for bone metastases.  CT angio chest showed a 4.5 x 4.9 x 3.7 cm right upper lobe peripheral mass with probable pleural involvement and potential early invasion into right middle lobe.  Borderline enlarged right hilar lymph nodes measuring up to 1.2 cm.   PET CT scan showed hypermetabolic 4.8 cm right upper lobe mass consistent with primary bronchogenic carcinoma and mild asymmetric FDG activity in the right suprahilar region suspicious for right hilar lymph node metastases.   Patient had a CT-guided right upper lobe lung biopsy which was consistent with squamous cell carcinoma.  Completed concurrent chemoradiation with weekly CarboTaxol in September 2023.CT angio chest in May 2025 which was done for worsening shortness of breath showed interval increase in the size of the right upper lobe mass from 2 cm to 4.9 cm.  This was followed by a PET CT scan on 07/19/2023 which showed SUV uptake of 12.5 in the right upper lobe lung mass measuring 4.6 x 4.5 cm.  Some uptake within scattered mediastinal lymph nodes which have remained unchanged since November 2024 with an SUV uptake of 3.1.  Patient underwent bronchoscopy by Dr. Sherrine Dolly on 08/02/2023.  Right upper lobe lung lavage was positive for squamous cell carcinoma and FNA was positive for squamous cell carcinoma well.  FNA from station 4R 10L and lymph node station 7 negative for malignancy.  Interval history-patient has baseline fatigue and exertional shortness of breath.  He is not using any oxygen at rest.  ECOG PS- 2 Pain scale- 0   Review of systems- Review of Systems  Constitutional:  Positive for malaise/fatigue. Negative for chills, fever and weight loss.  HENT:  Negative for congestion, ear discharge and nosebleeds.   Eyes:  Negative for blurred vision.  Respiratory:  Positive for shortness of breath. Negative for cough, hemoptysis, sputum production and wheezing.   Cardiovascular:  Negative for chest pain, palpitations, orthopnea and claudication.  Gastrointestinal:  Negative for abdominal pain, blood in stool, constipation, diarrhea, heartburn, melena, nausea and vomiting.  Genitourinary:  Negative for dysuria, flank pain, frequency, hematuria and urgency.  Musculoskeletal:  Negative for back pain, joint pain and myalgias.  Skin:  Negative for rash.  Neurological:  Negative for dizziness, tingling, focal weakness, seizures, weakness and headaches.  Endo/Heme/Allergies:  Does not bruise/bleed easily.   Psychiatric/Behavioral:  Negative for depression and suicidal ideas. The patient does not have insomnia.       Allergies  Allergen Reactions   Codeine Nausea And Vomiting and Nausea Only     Past Medical History:  Diagnosis Date   Aortic atherosclerosis (HCC)    Arthritis    Bilateral carotid artery disease (HCC)    a.) s/p PTA/stenting 09/11/2021: 9 mm x 7 mm x 4 cm Exact stent to RIGHT   BPH (benign prostatic hyperplasia)    Chronic kidney disease    COPD (chronic obstructive pulmonary disease) (HCC)    Coronary artery disease    a.) PCI 12/11/2010: 99% pRCA (3 x 15 mm Xience V DES); b.) staged PCI 01/05/2011: 80 % pLCx (3.5 x 23 mm Xience V DES); c.) PCI 12/19/2018: 75% ISR pRCA (3 x 16 mm Synergy DES), 85% mRCA (3 x 20 mm Synergy DES), 95% mRCA (3 x 16 mm Synergy), 85% p-mLAD (rotational athrectomy + overlapping 3 x 24 mm and 3 x 28 mm Synergy DES)   DDD (degenerative disc disease), cervical    Diverticulosis    Dyspnea    Full dentures    GERD (gastroesophageal reflux disease)    History of heart artery stent 12/11/2010   a.) TOTAL of 7 coronary stents as of 08/01/2023   Hypercholesteremia    Hypertension    Hypokalemia    Long term current use of clopidogrel     Nephrolithiasis    NSTEMI (non-ST elevated myocardial infarction) (HCC) 12/10/2010   a.) PCI 12/11/2010: 99% pRCA (3 x 15 mm Xience V DES); b.) staged PCI 01/05/2011: 80 % pLCx (3.5 x 23 mm Xience V DES)   On long term clopidogrel  therapy    Pneumonia    Renal colic    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   a.) stage II B (cT2, cN1, cM0) s/p concurrent chemoradiation (weekly CarboTaxol + 70 cGy over 7 weeks; completed 12/2021)   T2DM (type 2 diabetes mellitus) (HCC)      Past Surgical History:  Procedure Laterality Date   CAROTID PTA/STENT INTERVENTION Right 09/11/2021   Procedure: CAROTID PTA/STENT INTERVENTION;  Surgeon: Celso College, MD;  Location: [facility];   Service: Cardiovascular;  Laterality: Right;   CATARACT EXTRACTION, BILATERAL     COLONOSCOPY  04/27/2005   COLONOSCOPY WITH PROPOFOL  N/A 06/08/2015   Procedure: COLONOSCOPY WITH PROPOFOL ;  Surgeon: Marshall Skeeter, MD;  Location: [facility];  Service: Endoscopy;  Laterality: N/A;   CORONARY ANGIOPLASTY WITH STENT PLACEMENT Left 12/11/2010   Procedure: CORONARY ANGIOPLASTY WITH STENT PLACEMENT; Location: [facility]; Surgeon: Thais Fill, MD   CORONARY ANGIOPLASTY WITH STENT PLACEMENT Left 12/19/2018   Procedure: CORONARY ANGIOPLASTY WITH STENT PLACEMENT; Location: [facility]   CORONARY STENT INTERVENTION Left 01/05/2011   Procedure: CORONARY STENT INTERVENTION; Location: [facility]; Surgeon: Thais Fill, MD   CYSTOSCOPY WITH INSERTION OF UROLIFT N/A 02/11/2020   Procedure: CYSTOSCOPY WITH INSERTION OF UROLIFT;  Surgeon: Rea Cambridge, MD;  Location: [facility];  Service: Urology;  Laterality: N/A;   CYSTOSCOPY WITH STENT PLACEMENT Right 09/16/2016   Procedure: CYSTOSCOPY WITH STENT PLACEMENT;  Surgeon: Rea Cambridge, MD;  Location: [facility];  Service: Urology;  Laterality: Right;   EXTRACORPOREAL SHOCK WAVE LITHOTRIPSY Left 11/04/2014   has had 2 previous lithotripsies   EXTRACORPOREAL SHOCK WAVE LITHOTRIPSY  Left 03/24/2015   Procedure: EXTRACORPOREAL SHOCK WAVE LITHOTRIPSY (ESWL);  Surgeon: Rea Cambridge, MD;  Location: [facility];  Service: Urology;  Laterality: Left;   EXTRACORPOREAL SHOCK WAVE LITHOTRIPSY Right 10/04/2016   Procedure: EXTRACORPOREAL SHOCK WAVE LITHOTRIPSY (ESWL);  Surgeon: Rea Cambridge, MD;  Location: [facility];  Service: Urology;  Laterality: Right;   LEFT HEART CATH AND CORONARY ANGIOGRAPHY N/A 12/05/2018   Procedure: LEFT HEART CATH AND CORONARY ANGIOGRAPHY with possible pci and stent;  Surgeon: Antonette Batters, MD;  Location: [facility];  Service: Cardiovascular;  Laterality: N/A;   PORTA CATH INSERTION Right 09/25/2021   Procedure: PORTA CATH  INSERTION;  Surgeon: Celso College, MD;  Location: [facility];  Service: Cardiovascular;  Laterality: Right;   VIDEO BRONCHOSCOPY WITH ENDOBRONCHIAL NAVIGATION N/A 08/02/2023   Procedure: VIDEO BRONCHOSCOPY WITH ENDOBRONCHIAL NAVIGATION;  Surgeon: Erskin Hearing, MD;  Location: [facility];  Service: Thoracic;  Laterality: N/A;   VIDEO BRONCHOSCOPY WITH ENDOBRONCHIAL ULTRASOUND N/A 08/02/2023   Procedure: BRONCHOSCOPY, WITH EBUS;  Surgeon: Erskin Hearing, MD;  Location: [facility];  Service: Thoracic;  Laterality: N/A;    Social History   Socioeconomic History   Marital status: Married    Spouse name: Aden Agreste   Number of children: 2   Years of education: Not on file   Highest education level: 8th grade  Occupational History   Occupation: retired  Tobacco Use   Smoking status: Former    Current packs/day: 0.00    Average packs/day: 1 pack/day for 30.0 years (30.0 ttl pk-yrs)    Types: Cigarettes    Start date: 03/05/1954    Quit date: 03/05/1984    Years since quitting: 39.4   Smokeless tobacco: Current    Types: Chew   Tobacco comments:    Chews tobacco daily.  Vaping Use   Vaping status: Never Used  Substance and Sexual Activity   Alcohol use: No    Alcohol/week: 0.0 standard drinks of alcohol   Drug use: No   Sexual activity: Not Currently  Other Topics Concern   Not on file  Social History Narrative   Lives with Aden Agreste, wife, and son [place].   Social Drivers of Corporate investment banker Strain: Low Risk  (05/01/2023)   Overall Financial Resource Strain (CARDIA)    Difficulty of Paying Living Expenses: Not hard at all  Food Insecurity: No Food Insecurity (05/01/2023)   Hunger Vital Sign    Worried About Running Out of Food in the Last Year: Never true    Ran Out of Food in the Last Year: Never true  Transportation Needs: No Transportation Needs (05/01/2023)   PRAPARE - Administrator, Civil Service (Medical): No    Lack of Transportation (Non-Medical): No   Physical Activity: Insufficiently Active (05/01/2023)   Exercise Vital Sign    Days of Exercise per Week: 1 day    Minutes of Exercise per Session: 90 min  Stress: No Stress Concern Present (05/01/2023)   [employer] of Occupational Health - Occupational Stress Questionnaire    Feeling of Stress : Not at all  Social Connections: Moderately Integrated (05/01/2023)   Social Connection and Isolation Panel [NHANES]    Frequency of Communication with Friends and Family: More than three times a week    Frequency of Social Gatherings with Friends and Family: More than three times a week    Attends Religious Services: More than 4 times per year    Active Member  of Clubs or Organizations: No    Attends Banker Meetings: Never    Marital Status: Married  Catering manager Violence: Not At Risk (05/01/2023)   Humiliation, Afraid, Rape, and Kick questionnaire    Fear of Current or Ex-Partner: No    Emotionally Abused: No    Physically Abused: No    Sexually Abused: No    Family History  Problem Relation Age of Onset   Pulmonary embolism Mother    Transient ischemic attack Mother    Diabetes Mother    Pancreatic cancer Father    Hypertension Father    Diabetes Father    Cirrhosis Brother      Current Outpatient Medications:    atorvastatin  (LIPITOR) 40 MG tablet, TAKE ONE TABLET BY MOUTH DAILY, Disp: 90 tablet, Rfl: 3   celecoxib  (CELEBREX ) 100 MG capsule, Take 100 mg by mouth daily. (Patient not taking: Reported on 07/26/2023), Disp: , Rfl:    cetirizine  (ZYRTEC ) 10 MG tablet, Take 1/2 tablet daily, Disp: 30 tablet, Rfl: 11   clopidogrel  (PLAVIX ) 75 MG tablet, Take 75 mg by mouth daily., Disp: , Rfl:    co-enzyme Q-10 30 MG capsule, Take 30 mg by mouth daily., Disp: , Rfl:    Cranberry-Vitamin C-Probiotic (AZO CRANBERRY PO), Take 2 tablets by mouth daily at 12 noon., Disp: , Rfl:    diclofenac  sodium (VOLTAREN ) 1 % GEL, Apply 2 g topically 4 (four) times daily., Disp: 100  g, Rfl: 3   Krill Oil 1000 MG CAPS, Take by mouth., Disp: , Rfl:    metoprolol  tartrate (LOPRESSOR ) 25 MG tablet, Take 0.5 tablets (12.5 mg total) by mouth 2 (two) times daily., Disp: 60 tablet, Rfl: 1   MULTIPLE VITAMIN PO, Take 1 tablet by mouth daily., Disp: , Rfl:    nitroGLYCERIN  (NITROSTAT ) 0.4 MG SL tablet, Place under the tongue., Disp: , Rfl:    OneTouch Delica Lancets 33G MISC, TEST FASTING GLUCOSE LEVEL EACH MORNING BEFORE BREAKFAST (Patient not taking: Reported on 05/28/2023), Disp: 100 each, Rfl: 4   pantoprazole  (PROTONIX ) 40 MG tablet, Take 40 mg by mouth daily., Disp: , Rfl:    potassium chloride  SA (KLOR-CON  M) 20 MEQ tablet, Take 1 tablet (20 mEq total) by mouth daily., Disp: 7 tablet, Rfl: 0   ramipril  (ALTACE ) 5 MG capsule, Take 1 capsule (5 mg total) by mouth 2 (two) times daily., Disp: 60 capsule, Rfl: 1  Physical exam:  Vitals:   08/07/23 1310  BP: (!) 144/69  Pulse: (!) 103  Resp: 20  Temp: (!) 96.6 F (35.9 C)  TempSrc: Tympanic  SpO2: 96%  Weight: 167 lb (75.8 kg)  Height: 5\' 4"  (1.626 m)   Physical Exam Cardiovascular:     Rate and Rhythm: Normal rate and regular rhythm.     Heart sounds: Normal heart sounds.  Pulmonary:     Effort: Pulmonary effort is normal.     Breath sounds: Normal breath sounds.  Skin:    General: Skin is warm and dry.  Neurological:     Mental Status: He is alert and oriented to person, place, and time.      I have personally reviewed labs listed below:    Latest Ref Rng & Units 07/23/2023    8:48 AM  CMP  Glucose 70 - 99 mg/dL 782   BUN 8 - 23 mg/dL 17   Creatinine 9.56 - 1.24 mg/dL 2.13   Sodium 086 - 578 mmol/L 133   Potassium 3.5 - 5.1  mmol/L 3.3   Chloride 98 - 111 mmol/L 103   CO2 22 - 32 mmol/L 20   Calcium  8.9 - 10.3 mg/dL 8.4   Total Protein 6.5 - 8.1 g/dL 7.4   Total Bilirubin 0.0 - 1.2 mg/dL 0.7   Alkaline Phos 38 - 126 U/L 70   AST 15 - 41 U/L 26   ALT 0 - 44 U/L 42       Latest Ref Rng & Units  07/23/2023    8:48 AM  CBC  WBC 4.0 - 10.5 K/uL 8.4   Hemoglobin 13.0 - 17.0 g/dL 62.1   Hematocrit 30.8 - 52.0 % 42.2   Platelets 150 - 400 K/uL 249    I have personally reviewed Radiology images listed below: No images are attached to the encounter.  DG Chest Port 1 View Result Date: 08/02/2023 CLINICAL DATA:  [id number] S/P bronchoscopy [id number] EXAM: PORTABLE CHEST - 1 VIEW COMPARISON:  Jul 19, 2023, Jul 15, 2023 FINDINGS: Redemonstrated ovoid mass in the right mid lung zone. Subsegmental atelectasis in the right lung base. No pneumothorax or pleural effusion. No cardiomegaly. No acute fracture or destructive lesion. IMPRESSION: Redemonstrated ovoid mass in the right mid lung zone. No acute cardiopulmonary abnormality. No pneumothorax. Electronically Signed   By: Rance Burrows M.D.   On: 08/02/2023 16:16   DG C-Arm 1-60 Min-No Report Result Date: 08/02/2023 Fluoroscopy was utilized by the requesting physician.  No radiographic interpretation.   NM PET Image Restag (PS) Skull Base To Thigh Result Date: 07/19/2023 CLINICAL DATA:  Subsequent treatment strategy for right upper lobe lung cancer. EXAM: NUCLEAR MEDICINE PET SKULL BASE TO THIGH TECHNIQUE: 9.56 mCi F-18 FDG was injected intravenously. Full-ring PET imaging was performed from the skull base to thigh after the radiotracer. CT data was obtained and used for attenuation correction and anatomic localization. Fasting blood glucose: 101 mg/dl COMPARISON:  PET-CT scan 08/29/2021. CTA chest 07/15/2023. Chest abdomen pelvis CT 01/17/2023. FINDINGS: Mediastinal blood pool activity: SUV max 3.2 Liver activity: SUV max 3.8 NECK: No abnormal uptake seen in the neck including along lymph node change of the submandibular, posterior triangle or internal jugular regions. There is some left upper neck muscular paraspinal uptake. Near symmetric uptake of the visualized intracranial compartment. Incidental CT findings: Paranasal sinuses and mastoid air cells  are grossly clear. Mild mucosal thickening along the right maxillary sinus. The parotid glands are preserved. Atrophic left submandibular gland. Small thyroid  gland. Right neck vascular stent. CHEST: Originally there is right upper lobe mass which measured 4.8 x 4.6 cm with a maximum SUV value of 16.3 in June 2023. Subsequently by CT the lesion had decreased in size over time. In May 2024 lesion measured 2.6 x 2.1 cm and in November 2024 2.6 x 2.0 cm. Today this lesion has maximum SUV value of 12.5 and lesion on image 51 of series 6 measures 4.6 by 4.5 cm. There is significant surrounding consolidative lung opacity with bronchial distortion. This extends out to the edge of the pleura with there is some focal pleural thickening. There is also some low-level uptake along consolidation volume loss in the superior segment of the right lower lobe which is bandlike. This has been present on multiple prior examinations could be posttreatment related. There is some uptake within scattered mediastinal nodes. Example right paratracheal in the upper mediastinum has maximum SUV of 3.1. Subcarinal 4.3. These nodes are less than a cm in short axis and are unchanged in size from older CT scan November  2024. Incidental CT findings: Coronary calcifications are seen. No pericardial effusion. The thoracic aorta is normal course and caliber with some calcified plaque. Right IJ chest port with tip extending to the SVC right atrial junction region. Breathing motion. Chronic lung changes identified. This includes centrilobular emphysematous changes, scarring and fibrotic changes. ABDOMEN/PELVIS: Physiologic distribution radiotracer along the parenchymal organs, bowel and renal collecting systems. Incidental CT findings: Enlarged prostate with some calcifications. Preserved contour to the urinary bladder. Bilateral Bosniak 1 renal cysts. No renal or ureteral stones. Grossly the liver, spleen, adrenal glands and pancreas are unremarkable.  Large bowel has a normal course and caliber. Scattered stool. Normal appendix. Few scattered left-sided diverticula. Stomach and small bowel are identified. Moderate calcified plaque along the aorta and branch vessels. No obvious free air or free fluid. SKELETON: No significant abnormal radiotracer uptake along the visualized osseous structures. Some mild degenerative type uptake along the lumbar spine. Incidental CT findings: Curvature of the spine with multifocal degenerative changes. Trace listhesis at L5-S1. Degenerative changes seen of the pelvis and shoulders. IMPRESSION: Increasing size of the right upper lobe mass compared to the study of November 2024. There is also significant uptake in this location worrisome for areas of neoplasm. Surrounding parenchymal opacities with distortion and bronchiectasis are again noted and have been present previously. Several prominent mediastinal lymph nodes have low-level uptake and are nonspecific. These appears similar in size to previous examinations. No additional more remote areas of abnormal uptake at this time. Electronically Signed   By: Adrianna Horde M.D.   On: 07/19/2023 12:05   CT Angio Chest PE W and/or Wo Contrast Result Date: 07/15/2023 CLINICAL DATA:  Worsening shortness of breath. History of lung cancer, pulmonary fibrosis and COPD. EXAM: CT ANGIOGRAPHY CHEST WITH CONTRAST TECHNIQUE: Multidetector CT imaging of the chest was performed using the standard protocol during bolus administration of intravenous contrast. Multiplanar CT image reconstructions and MIPs were obtained to evaluate the vascular anatomy. RADIATION DOSE REDUCTION: This exam was performed according to the departmental dose-optimization program which includes automated exposure control, adjustment of the mA and/or kV according to patient size and/or use of iterative reconstruction technique. CONTRAST:  75mL OMNIPAQUE  IOHEXOL  350 MG/ML SOLN COMPARISON:  Radiograph earlier today.  Chest CT  01/17/2023 FINDINGS: Cardiovascular: There are no filling defects within the pulmonary arteries to suggest pulmonary embolus. Diffuse thoracic aortic atherosclerosis. No dissection or evidence of acute aortic abnormality. The heart is upper normal in size. Coronary artery calcifications or stents. No pericardial effusion. Right chest port in place. Mediastinum/Nodes: Increased subcarinal node currently 15 mm short axis series 4, image 76, previously 10 mm. 9 mm left paratracheal node series 4, image 57. Soft tissue density at the hilum is contiguous with right upper lobe opacity. Decompressed esophagus. Lungs/Pleura: The right upper lobe nodule has increased in size from prior exam, difficult to accurately measure given adjacent opacity. Representative measurement of 4.9 x 4.3 cm, series 5, image 55, previously 2 x 2.6 cm. There is surrounding masslike opacity in the right upper lobe as well as the adjacent superior segment of the right lower lobe with bronchiectasis and architectural distortion, likely post treatment related change. Moderate emphysema. No definite honeycombing. Faint tree-in-bud opacity in the left upper lobe. No pleural effusion. Upper Abdomen: Right renal cyst with peripheral calcification, partially included in the field of view but stable where included. No acute upper abdominal findings. Musculoskeletal: Thoracic spondylosis with anterior spurring. Remote right clavicle fracture. No evidence of focal bone lesion or acute  osseous finding. Stable right axillary subcutaneous nodule, allowing for differences in arm positioning, series 4, image 79. Review of the MIP images confirms the above findings. IMPRESSION: 1. No pulmonary embolus. 2. Interval increase in size of right upper lobe nodule, currently 4.9 x 4.3 cm, previously 2 x 2.6 cm, suspicious for disease progression. Surrounding masslike opacity in the right upper lobe as well as the adjacent superior segment of the right lower lobe with  bronchiectasis and architectural distortion, likely post treatment related change. Recommend oncologic follow-up. PET may be of value. 3. Increased subcarinal lymph node. 4. Faint tree-in-bud opacity in the left upper lobe, likely infectious or inflammatory. Aortic Atherosclerosis (ICD10-I70.0) and Emphysema (ICD10-J43.9). Electronically Signed   By: Chadwick Colonel M.D.   On: 07/15/2023 22:32   DG Chest 2 View Result Date: 07/15/2023 CLINICAL DATA:  Shortness of breath EXAM: CHEST - 2 VIEW COMPARISON:  10/08/2022, 01/17/2023 CT FINDINGS: Cardiac shadow is stable. Right chest wall port is again identified. Chronic consolidation in the right upper lobe is noted no new focal infiltrate is seen. No sizable effusion is noted. Old bilateral clavicular fractures are noted with healing. IMPRESSION: No active cardiopulmonary disease. Electronically Signed   By: Violeta Grey M.D.   On: 07/15/2023 19:10     Assessment and plan- Patient is a 78 y.o. 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   He is s/p 7 cycles of concurrent chemoradiation ending in October 2023.  He is here to discuss PET scan and bronchoscopy results and further management  I have reviewed PET CT scan images independently and discussed findings with the patient and his family.  Patient initially had a CT scan in May 2025 in the ER to rule out PE which incidentally showed increase in the size of his previously treated right upper lobe lung mass from 2 cm to 4.6 cm.  PET scan confirms these findings with an SUV of 12.5.  There were nonspecific mediastinal lymph nodes noted with an SUV of 3 but had not changed in size as compared to November 2024.  Patient underwent bronchoscopy by Dr. Jaclynn Mast and had mediastinal lymph nodes sampled which were negative for malignancy.  Right upper lobe FNA was positive for squamous cell carcinoma.  I personally discussed this case with Dr. Jacalyn Martin from radiation oncology who  will be seeing him next week.  He is likely going to offer him SBRT for 10 fractions instead of another long course radiation.  Also in the absence of local regional lymph node involvement I am holding off on any chemotherapy at this time.  We will see how he responds to radiation alone.  I will plan on repeating CT chest abdomen pelvis with contrast in 3 months from now.  If there is any evidence of progressive or recurrent disease in the future I will consider offering him chemotherapy.  I am also checking PD-L1 on his tumor specimen   Visit Diagnosis 1. Recurrent non-small cell lung cancer (NSCLC) (HCC)   2. Malignant neoplasm of upper lobe of right lung [facility])      Dr. Seretha Dance, MD, MPH [facility] at [facility] [phone] 08/07/2023 4:15 PM

## 2023-08-08 DIAGNOSIS — R0600 Dyspnea, unspecified: Secondary | ICD-10-CM | POA: Diagnosis not present

## 2023-08-08 DIAGNOSIS — J449 Chronic obstructive pulmonary disease, unspecified: Secondary | ICD-10-CM | POA: Diagnosis not present

## 2023-08-08 DIAGNOSIS — J9611 Chronic respiratory failure with hypoxia: Secondary | ICD-10-CM | POA: Diagnosis not present

## 2023-08-08 DIAGNOSIS — R0689 Other abnormalities of breathing: Secondary | ICD-10-CM | POA: Diagnosis not present

## 2023-08-08 DIAGNOSIS — Z87891 Personal history of nicotine dependence: Secondary | ICD-10-CM | POA: Diagnosis not present

## 2023-08-08 DIAGNOSIS — C3411 Malignant neoplasm of upper lobe, right bronchus or lung: Secondary | ICD-10-CM | POA: Diagnosis not present

## 2023-08-12 ENCOUNTER — Other Ambulatory Visit: Payer: Self-pay | Admitting: Internal Medicine

## 2023-08-12 ENCOUNTER — Telehealth: Payer: Self-pay

## 2023-08-12 DIAGNOSIS — H6123 Impacted cerumen, bilateral: Secondary | ICD-10-CM

## 2023-08-12 NOTE — Telephone Encounter (Signed)
 Pt.notified

## 2023-08-12 NOTE — Telephone Encounter (Signed)
 Copied from CRM [id number]. Topic: General - Other >> Aug 12, 2023  8:59 AM Luke Rojas wrote: Reason for CRM: Patient was advised by Dr Bud Care that ears weren't better after ear cleaning and medication OTC to give her a call  and she would refer him to an ear doctor.He wpld like to know if that referral could be placed duet o him still having issues with his ears.

## 2023-08-13 ENCOUNTER — Encounter: Payer: Self-pay | Admitting: Radiation Oncology

## 2023-08-13 ENCOUNTER — Ambulatory Visit
Admission: RE | Admit: 2023-08-13 | Discharge: 2023-08-13 | Disposition: A | Discharge status: Home / Self Care | Source: Ambulatory Visit | Attending: Radiation Oncology | Admitting: Radiation Oncology

## 2023-08-13 VITALS — Resp 14 | Ht 64.0 in | Wt 169.7 lb

## 2023-08-13 DIAGNOSIS — Z87891 Personal history of nicotine dependence: Secondary | ICD-10-CM | POA: Diagnosis not present

## 2023-08-13 DIAGNOSIS — C3411 Malignant neoplasm of upper lobe, right bronchus or lung: Secondary | ICD-10-CM | POA: Insufficient documentation

## 2023-08-13 DIAGNOSIS — R918 Other nonspecific abnormal finding of lung field: Secondary | ICD-10-CM

## 2023-08-13 NOTE — Progress Notes (Signed)
 Radiation Oncology Follow up Note   old patient new area recurrent lung cancer  Name: Luke Rojas   Date:   08/13/2023 MRN:  [id number] DOB: 1945-08-09    This 78 y.o. 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  PET CT scan in May showed hypermetabolic activity in the right upper lobe mass measuring 4.6 x 4.5 cm.  Did have some slight scattered mediastinal nodes although they have been stable in size over time.  Patient underwent bronchoscopy and right upper lung lavage was positive for squamous cell carcinoma and an FA was positive again for squamous cell carcinoma.  FNA from station 4R 10L and lymph node station 7 were all negative for malignancy.  Case was presented at tumor conference he has seen Dr. Randy Buttery.  In the absence of local regional nodal involvement he will not receive systemic treatment.  He is seen today for consideration of salvage treatment.  Patient clinically is doing fairly well he states his dyspnea on exertion is not that significant p.o. intake is good he is having no dysphagia.  He is having no cough or hemoptysis at this time  COMPLICATIONS OF TREATMENT: none  FOLLOW UP COMPLIANCE: keeps appointments   PHYSICAL EXAM:  Resp 14   Ht 5\' 4"  (1.626 m)   Wt 169 lb 11.2 oz (77 kg)   BMI 29.13 kg/m  Well-developed well-nourished patient in NAD. HEENT reveals PERLA,  EOMI, discs not visualized.  Oral cavity is clear. No oral mucosal lesions are identified. Neck is clear without evidence of cervical or supraclavicular adenopathy. Lungs are clear to A&P. Cardiac examination is essentially unremarkable with regular rate and rhythm without murmur rub or thrill. Abdomen is benign with no organomegaly or masses noted. Motor sensory and DTR levels are equal and symmetric in the upper and lower extremities. Cranial nerves II through XII are grossly intact. Proprioception is intact. No peripheral adenopathy or edema is identified. No motor or sensory levels are noted. Crude visual fields are within normal range.  RADIOLOGY RESULTS: CT scans and PET CT scans reviewed compatible with above-stated findings  PLAN: At this time lets go ahead with salvage radiation therapy to his right chest.  Will plan on delivering 60 Gray in 15 fractions using IMRT treatment planning and delivery.  Lesion is rather large for SBRT treatment so I will use the further protracted hypofractionated course of treatment over 3 weeks.  Risks and benefits of treatment including possible development of cough fatigue radiation skin changes possible slight dysphagia alteration of blood counts all were discussed in detail with the patient and his wife.  I have personally set up and ordered CT simulation for later this week.  I would like to take this opportunity to thank you for allowing me to participate in the care of your patient.Glenis Langdon, MD

## 2023-08-14 ENCOUNTER — Other Ambulatory Visit: Payer: Self-pay | Admitting: Internal Medicine

## 2023-08-14 DIAGNOSIS — Z87891 Personal history of nicotine dependence: Secondary | ICD-10-CM | POA: Diagnosis not present

## 2023-08-14 DIAGNOSIS — I152 Hypertension secondary to endocrine disorders: Secondary | ICD-10-CM

## 2023-08-14 DIAGNOSIS — C3411 Malignant neoplasm of upper lobe, right bronchus or lung: Secondary | ICD-10-CM | POA: Diagnosis not present

## 2023-08-14 DIAGNOSIS — I951 Orthostatic hypotension: Secondary | ICD-10-CM

## 2023-08-14 DIAGNOSIS — R2689 Other abnormalities of gait and mobility: Secondary | ICD-10-CM

## 2023-08-16 NOTE — Telephone Encounter (Signed)
 Requested medication (s) are due for refill today: yes  Requested medication (s) are on the active medication list: yes  Last refill:  05/30/23 #60 caps 1 RF  Future visit scheduled: no - sent pt MyChart message to call and make appt  Notes to clinic:  low potassium- was ordered KCL supplementation Requested Prescriptions  Pending Prescriptions Disp Refills   ramipril  (ALTACE ) 5 MG capsule [Pharmacy Med Name: RAMIPRIL  5MG  CAPSULE] 60 capsule 1    Sig: TAKE ONE CAPSULE (5 MG TOTAL) BY MOUTH TWO (TWO) TIMES DAILY.     Cardiovascular:  ACE Inhibitors Failed - 08/16/2023 11:47 AM      Failed - K in normal range and within 180 days    Potassium  Date Value Ref Range Status  07/23/2023 3.3 (L) 3.5 - 5.1 mmol/L Final  10/02/2011 4.2 3.5 - 5.1 mmol/L Final         Failed - Last BP in normal range    BP Readings from Last 1 Encounters:  08/07/23 (!) 144/69         Passed - Cr in normal range and within 180 days    Creatinine  Date Value Ref Range Status  01/22/2023 1.23 0.61 - 1.24 mg/dL Final  16/12/9602 5.40 (H) 0.60 - 1.30 mg/dL Final   Creatinine, Ser  Date Value Ref Range Status  07/23/2023 1.07 0.61 - 1.24 mg/dL Final         Passed - Patient is not pregnant      Passed - Valid encounter within last 6 months    Recent Outpatient Visits           1 week ago Hearing loss of both ears due to cerumen impaction   [facility] Rockney Cid, DO   2 months ago Orthostatic hypotension   [facility] Rockney Cid, DO   2 months ago Orthostatic hypotension   [facility] Rockney Cid, DO   3 months ago Encounter for annual physical exam   [facility], Stan Eans, MD

## 2023-08-22 ENCOUNTER — Encounter: Payer: Self-pay | Admitting: *Deleted

## 2023-08-22 NOTE — Progress Notes (Signed)
 Follow up call made to [facility] client services to check on the status of PDL1 results. At this time, PDL1 results are still pending.

## 2023-08-27 ENCOUNTER — Ambulatory Visit
Admission: RE | Admit: 2023-08-27 | Discharge: 2023-08-27 | Disposition: A | Discharge status: Home / Self Care | Source: Ambulatory Visit | Attending: Radiation Oncology | Admitting: Radiation Oncology

## 2023-08-27 DIAGNOSIS — C3411 Malignant neoplasm of upper lobe, right bronchus or lung: Secondary | ICD-10-CM | POA: Diagnosis not present

## 2023-08-27 DIAGNOSIS — Z87891 Personal history of nicotine dependence: Secondary | ICD-10-CM | POA: Diagnosis not present

## 2023-08-28 DIAGNOSIS — C3491 Malignant neoplasm of unspecified part of right bronchus or lung: Secondary | ICD-10-CM | POA: Diagnosis not present

## 2023-08-28 DIAGNOSIS — R0689 Other abnormalities of breathing: Secondary | ICD-10-CM | POA: Diagnosis not present

## 2023-08-28 DIAGNOSIS — J441 Chronic obstructive pulmonary disease with (acute) exacerbation: Secondary | ICD-10-CM | POA: Diagnosis not present

## 2023-08-28 DIAGNOSIS — Z87891 Personal history of nicotine dependence: Secondary | ICD-10-CM | POA: Diagnosis not present

## 2023-08-28 DIAGNOSIS — R5381 Other malaise: Secondary | ICD-10-CM | POA: Diagnosis not present

## 2023-08-28 DIAGNOSIS — R0600 Dyspnea, unspecified: Secondary | ICD-10-CM | POA: Diagnosis not present

## 2023-08-28 DIAGNOSIS — C3411 Malignant neoplasm of upper lobe, right bronchus or lung: Secondary | ICD-10-CM | POA: Diagnosis not present

## 2023-08-29 ENCOUNTER — Other Ambulatory Visit: Payer: Self-pay | Admitting: *Deleted

## 2023-08-29 DIAGNOSIS — R918 Other nonspecific abnormal finding of lung field: Secondary | ICD-10-CM

## 2023-08-30 NOTE — Progress Notes (Signed)
 Spoke to [employer] client services regarding PDL1 results. PDL1 is still pending at this time. [facility] will follow up regarding when results should be made available since request was made on 6/4. My contact info was given to call back with an update when available.

## 2023-09-04 ENCOUNTER — Encounter: Payer: Self-pay | Admitting: *Deleted

## 2023-09-04 ENCOUNTER — Ambulatory Visit
Admission: RE | Admit: 2023-09-04 | Discharge: 2023-09-04 | Disposition: A | Discharge status: Home / Self Care | Source: Ambulatory Visit | Attending: Radiation Oncology | Admitting: Radiation Oncology

## 2023-09-04 DIAGNOSIS — C3411 Malignant neoplasm of upper lobe, right bronchus or lung: Secondary | ICD-10-CM | POA: Insufficient documentation

## 2023-09-04 NOTE — Progress Notes (Signed)
 Received notification from [facility] that tissue sample [id number] was not sufficient enough for PDL1 testing. New order faxed to [facility] requesting PDL1 on original biopsy sample [id number]. Dr. Melanee made aware.

## 2023-09-09 ENCOUNTER — Other Ambulatory Visit: Payer: Self-pay

## 2023-09-09 ENCOUNTER — Ambulatory Visit
Admission: RE | Admit: 2023-09-09 | Discharge: 2023-09-09 | Disposition: A | Discharge status: Home / Self Care | Source: Ambulatory Visit | Attending: Radiation Oncology | Admitting: Radiation Oncology

## 2023-09-09 ENCOUNTER — Inpatient Hospital Stay

## 2023-09-09 DIAGNOSIS — Z85118 Personal history of other malignant neoplasm of bronchus and lung: Secondary | ICD-10-CM | POA: Insufficient documentation

## 2023-09-09 DIAGNOSIS — R918 Other nonspecific abnormal finding of lung field: Secondary | ICD-10-CM

## 2023-09-09 DIAGNOSIS — Z510 Encounter for antineoplastic radiation therapy: Secondary | ICD-10-CM | POA: Diagnosis not present

## 2023-09-09 DIAGNOSIS — Z87891 Personal history of nicotine dependence: Secondary | ICD-10-CM | POA: Diagnosis not present

## 2023-09-09 DIAGNOSIS — C3411 Malignant neoplasm of upper lobe, right bronchus or lung: Secondary | ICD-10-CM | POA: Diagnosis not present

## 2023-09-09 LAB — RAD ONC ARIA SESSION SUMMARY
Course Elapsed Days: 0
Plan Fractions Treated to Date: 1
Plan Prescribed Dose Per Fraction: 4 Gy
Plan Total Fractions Prescribed: 15
Plan Total Prescribed Dose: 60 Gy
Reference Point Dosage Given to Date: 4 Gy
Reference Point Session Dosage Given: 4 Gy
Session Number: 1

## 2023-09-09 LAB — CBC (CANCER CENTER ONLY)
HCT: 45.7 % (ref 39.0–52.0)
Hemoglobin: 15.4 g/dL (ref 13.0–17.0)
MCH: 30.1 pg (ref 26.0–34.0)
MCHC: 33.7 g/dL (ref 30.0–36.0)
MCV: 89.3 fL (ref 80.0–100.0)
Platelet Count: 211 K/uL (ref 150–400)
RBC: 5.12 MIL/uL (ref 4.22–5.81)
RDW: 15.8 % — ABNORMAL HIGH (ref 11.5–15.5)
WBC Count: 17.1 K/uL — ABNORMAL HIGH (ref 4.0–10.5)
nRBC: 0 % (ref 0.0–0.2)

## 2023-09-10 ENCOUNTER — Ambulatory Visit
Admission: RE | Admit: 2023-09-10 | Discharge: 2023-09-10 | Disposition: A | Discharge status: Home / Self Care | Source: Ambulatory Visit | Attending: Radiation Oncology | Admitting: Radiation Oncology

## 2023-09-10 ENCOUNTER — Other Ambulatory Visit: Payer: Self-pay

## 2023-09-10 DIAGNOSIS — C3411 Malignant neoplasm of upper lobe, right bronchus or lung: Secondary | ICD-10-CM | POA: Diagnosis not present

## 2023-09-10 DIAGNOSIS — Z87891 Personal history of nicotine dependence: Secondary | ICD-10-CM | POA: Diagnosis not present

## 2023-09-10 DIAGNOSIS — Z510 Encounter for antineoplastic radiation therapy: Secondary | ICD-10-CM | POA: Diagnosis not present

## 2023-09-10 LAB — RAD ONC ARIA SESSION SUMMARY
Course Elapsed Days: 1
Plan Fractions Treated to Date: 2
Plan Prescribed Dose Per Fraction: 4 Gy
Plan Total Fractions Prescribed: 15
Plan Total Prescribed Dose: 60 Gy
Reference Point Dosage Given to Date: 8 Gy
Reference Point Session Dosage Given: 4 Gy
Session Number: 2

## 2023-09-11 ENCOUNTER — Ambulatory Visit
Admission: RE | Admit: 2023-09-11 | Discharge: 2023-09-11 | Disposition: A | Discharge status: Home / Self Care | Source: Ambulatory Visit | Attending: Radiation Oncology | Admitting: Radiation Oncology

## 2023-09-11 ENCOUNTER — Other Ambulatory Visit: Payer: Self-pay

## 2023-09-11 DIAGNOSIS — C3411 Malignant neoplasm of upper lobe, right bronchus or lung: Secondary | ICD-10-CM | POA: Diagnosis not present

## 2023-09-11 DIAGNOSIS — Z510 Encounter for antineoplastic radiation therapy: Secondary | ICD-10-CM | POA: Diagnosis not present

## 2023-09-11 DIAGNOSIS — Z87891 Personal history of nicotine dependence: Secondary | ICD-10-CM | POA: Diagnosis not present

## 2023-09-11 LAB — RAD ONC ARIA SESSION SUMMARY
Course Elapsed Days: 2
Plan Fractions Treated to Date: 3
Plan Prescribed Dose Per Fraction: 4 Gy
Plan Total Fractions Prescribed: 15
Plan Total Prescribed Dose: 60 Gy
Reference Point Dosage Given to Date: 12 Gy
Reference Point Session Dosage Given: 4 Gy
Session Number: 3

## 2023-09-12 ENCOUNTER — Ambulatory Visit
Admission: RE | Admit: 2023-09-12 | Discharge: 2023-09-12 | Disposition: A | Discharge status: Home / Self Care | Source: Ambulatory Visit | Attending: Radiation Oncology | Admitting: Radiation Oncology

## 2023-09-12 ENCOUNTER — Other Ambulatory Visit: Payer: Self-pay

## 2023-09-12 DIAGNOSIS — Z87891 Personal history of nicotine dependence: Secondary | ICD-10-CM | POA: Diagnosis not present

## 2023-09-12 DIAGNOSIS — Z510 Encounter for antineoplastic radiation therapy: Secondary | ICD-10-CM | POA: Diagnosis not present

## 2023-09-12 DIAGNOSIS — C3411 Malignant neoplasm of upper lobe, right bronchus or lung: Secondary | ICD-10-CM | POA: Diagnosis not present

## 2023-09-12 LAB — RAD ONC ARIA SESSION SUMMARY
Course Elapsed Days: 3
Plan Fractions Treated to Date: 4
Plan Prescribed Dose Per Fraction: 4 Gy
Plan Total Fractions Prescribed: 15
Plan Total Prescribed Dose: 60 Gy
Reference Point Dosage Given to Date: 16 Gy
Reference Point Session Dosage Given: 4 Gy
Session Number: 4

## 2023-09-13 ENCOUNTER — Ambulatory Visit
Admission: RE | Admit: 2023-09-13 | Discharge: 2023-09-13 | Disposition: A | Discharge status: Home / Self Care | Source: Ambulatory Visit | Attending: Radiation Oncology | Admitting: Radiation Oncology

## 2023-09-13 ENCOUNTER — Other Ambulatory Visit: Payer: Self-pay

## 2023-09-13 DIAGNOSIS — Z87891 Personal history of nicotine dependence: Secondary | ICD-10-CM | POA: Diagnosis not present

## 2023-09-13 DIAGNOSIS — Z510 Encounter for antineoplastic radiation therapy: Secondary | ICD-10-CM | POA: Diagnosis not present

## 2023-09-13 DIAGNOSIS — C3411 Malignant neoplasm of upper lobe, right bronchus or lung: Secondary | ICD-10-CM | POA: Diagnosis not present

## 2023-09-13 LAB — RAD ONC ARIA SESSION SUMMARY
Course Elapsed Days: 4
Plan Fractions Treated to Date: 5
Plan Prescribed Dose Per Fraction: 4 Gy
Plan Total Fractions Prescribed: 15
Plan Total Prescribed Dose: 60 Gy
Reference Point Dosage Given to Date: 20 Gy
Reference Point Session Dosage Given: 4 Gy
Session Number: 5

## 2023-09-14 ENCOUNTER — Emergency Department
Admission: EM | Admit: 2023-09-14 | Discharge: 2023-09-14 | Disposition: A | Discharge status: Home / Self Care | Attending: Emergency Medicine | Admitting: Emergency Medicine

## 2023-09-14 ENCOUNTER — Emergency Department

## 2023-09-14 ENCOUNTER — Other Ambulatory Visit: Payer: Self-pay

## 2023-09-14 DIAGNOSIS — C3491 Malignant neoplasm of unspecified part of right bronchus or lung: Secondary | ICD-10-CM | POA: Insufficient documentation

## 2023-09-14 DIAGNOSIS — N189 Chronic kidney disease, unspecified: Secondary | ICD-10-CM | POA: Diagnosis not present

## 2023-09-14 DIAGNOSIS — R918 Other nonspecific abnormal finding of lung field: Secondary | ICD-10-CM | POA: Diagnosis not present

## 2023-09-14 DIAGNOSIS — E1122 Type 2 diabetes mellitus with diabetic chronic kidney disease: Secondary | ICD-10-CM | POA: Insufficient documentation

## 2023-09-14 DIAGNOSIS — J449 Chronic obstructive pulmonary disease, unspecified: Secondary | ICD-10-CM | POA: Insufficient documentation

## 2023-09-14 DIAGNOSIS — B370 Candidal stomatitis: Secondary | ICD-10-CM | POA: Insufficient documentation

## 2023-09-14 DIAGNOSIS — I129 Hypertensive chronic kidney disease with stage 1 through stage 4 chronic kidney disease, or unspecified chronic kidney disease: Secondary | ICD-10-CM | POA: Diagnosis not present

## 2023-09-14 DIAGNOSIS — R6884 Jaw pain: Secondary | ICD-10-CM | POA: Diagnosis not present

## 2023-09-14 DIAGNOSIS — I2510 Atherosclerotic heart disease of native coronary artery without angina pectoris: Secondary | ICD-10-CM | POA: Insufficient documentation

## 2023-09-14 DIAGNOSIS — M47812 Spondylosis without myelopathy or radiculopathy, cervical region: Secondary | ICD-10-CM | POA: Diagnosis not present

## 2023-09-14 DIAGNOSIS — S42002A Fracture of unspecified part of left clavicle, initial encounter for closed fracture: Secondary | ICD-10-CM | POA: Diagnosis not present

## 2023-09-14 DIAGNOSIS — I10 Essential (primary) hypertension: Secondary | ICD-10-CM | POA: Diagnosis not present

## 2023-09-14 DIAGNOSIS — R0602 Shortness of breath: Secondary | ICD-10-CM | POA: Diagnosis not present

## 2023-09-14 LAB — BASIC METABOLIC PANEL WITH GFR
Anion gap: 9 (ref 5–15)
BUN: 26 mg/dL — ABNORMAL HIGH (ref 8–23)
CO2: 22 mmol/L (ref 22–32)
Calcium: 8.8 mg/dL — ABNORMAL LOW (ref 8.9–10.3)
Chloride: 102 mmol/L (ref 98–111)
Creatinine, Ser: 0.98 mg/dL (ref 0.61–1.24)
GFR, Estimated: >60 mL/min (ref >60)
Glucose, Bld: 221 mg/dL — ABNORMAL HIGH (ref 70–99)
Potassium: 4.4 mmol/L (ref 3.5–5.1)
Sodium: 133 mmol/L — ABNORMAL LOW (ref 135–145)

## 2023-09-14 LAB — CBC
HCT: 46.2 % (ref 39.0–52.0)
Hemoglobin: 15.4 g/dL (ref 13.0–17.0)
MCH: 30.3 pg (ref 26.0–34.0)
MCHC: 33.3 g/dL (ref 30.0–36.0)
MCV: 90.9 fL (ref 80.0–100.0)
Platelets: 152 K/uL (ref 150–400)
RBC: 5.08 MIL/uL (ref 4.22–5.81)
RDW: 15.8 % — ABNORMAL HIGH (ref 11.5–15.5)
WBC: 17 K/uL — ABNORMAL HIGH (ref 4.0–10.5)
nRBC: 0 % (ref 0.0–0.2)

## 2023-09-14 LAB — TROPONIN I (HIGH SENSITIVITY)
Troponin I (High Sensitivity): 11 ng/L (ref <18)
Troponin I (High Sensitivity): 10 ng/L (ref <18)

## 2023-09-14 MED ORDER — HEPARIN SOD (PORK) LOCK FLUSH 100 UNIT/ML IV SOLN
500.0000 [IU] | Freq: Once | INTRAVENOUS | Status: AC
  Administered 2023-09-14: 500 [IU] via INTRAVENOUS
  Filled 2023-09-14: qty 5

## 2023-09-14 MED ORDER — HEPARIN SOD (PORK) LOCK FLUSH 100 UNIT/ML IV SOLN: 500.0000 [IU] | Freq: Once | INTRAVENOUS | Status: DC

## 2023-09-14 MED ORDER — NYSTATIN 100000 UNIT/ML MT SUSP: 5.0000 mL | Freq: Four times a day (QID) | OROMUCOSAL | 0 refills | Status: AC

## 2023-09-14 MED ORDER — NYSTATIN 100000 UNIT/ML MT SUSP
5.0000 mL | OROMUCOSAL | Status: AC
  Administered 2023-09-14: 500000 [IU] via OROMUCOSAL
  Filled 2023-09-14: qty 5

## 2023-09-14 MED ORDER — LIDOCAINE-PRILOCAINE 2.5-2.5 % EX CREA
TOPICAL_CREAM | CUTANEOUS | Status: AC
  Filled 2023-09-14: qty 5

## 2023-09-14 MED ORDER — IOHEXOL 300 MG/ML  SOLN
75.0000 mL | Freq: Once | INTRAMUSCULAR | Status: AC | PRN
  Administered 2023-09-14: 75 mL via INTRAVENOUS

## 2023-09-14 NOTE — ED Provider Notes (Addendum)
 [facility] Provider Note    Event Date/Time   First MD Initiated Contact with Patient 09/14/23 1500     (approximate)   History   Chief Complaint: Otalgia, Neck Pain, and Shoulder Pain   HPI  Luke Rojas is a 78 y.o. male with a history of COPD, CKD, GERD, lung cancer on radiation therapy, diabetes who comes ED complaining of left jaw pain and throat pain radiating down the throat.  No chest pain or trouble breathing, no fever.  Not on chemotherapy, but has been on Decadron  taper for the past 3 weeks.  Also complains of pain in the left ear and difficulty hearing from the left ear.  Notes that he went to have his external ears cleaned out of impacted cerumen recently, and it seemed it was not effective and he was told to use Debrox solution        Past Medical History:  Diagnosis Date   Aortic atherosclerosis (HCC)    Arthritis    Bilateral carotid artery disease (HCC)    a.) s/p PTA/stenting 09/11/2021: 9 mm x 7 mm x 4 cm Exact stent to RIGHT   BPH (benign prostatic hyperplasia)    Chronic kidney disease    COPD (chronic obstructive pulmonary disease) (HCC)    Coronary artery disease    a.) PCI 12/11/2010: 99% pRCA (3 x 15 mm Xience V DES); b.) staged PCI 01/05/2011: 80 % pLCx (3.5 x 23 mm Xience V DES); c.) PCI 12/19/2018: 75% ISR pRCA (3 x 16 mm Synergy DES), 85% mRCA (3 x 20 mm Synergy DES), 95% mRCA (3 x 16 mm Synergy), 85% p-mLAD (rotational athrectomy + overlapping 3 x 24 mm and 3 x 28 mm Synergy DES)   DDD (degenerative disc disease), cervical    Diverticulosis    Dyspnea    Full dentures    GERD (gastroesophageal reflux disease)    History of heart artery stent 12/11/2010   a.) TOTAL of 7 coronary stents as of 08/01/2023   Hypercholesteremia    Hypertension    Hypokalemia    Long term current use of clopidogrel     Nephrolithiasis    NSTEMI (non-ST elevated myocardial infarction) (HCC) 12/10/2010   a.) PCI 12/11/2010: 99% pRCA  (3 x 15 mm Xience V DES); b.) staged PCI 01/05/2011: 80 % pLCx (3.5 x 23 mm Xience V DES)   On long term clopidogrel  therapy    Pneumonia    Renal colic    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   a.) stage II B (cT2, cN1, cM0) s/p concurrent chemoradiation (weekly CarboTaxol + 70 cGy over 7 weeks; completed 12/2021)   T2DM (type 2 diabetes mellitus) [facility])     Current Outpatient Rx   Order #: [id number] Class: Historical Med   Order #: [id number] Class: Normal   Order #: [id number] Class: Normal   Order #: [id number] Class: Historical Med   Order #: [id number] Class: Normal   Order #: [id number] Class: Historical Med   Order #: [id number] Class: Historical Med   Order #: [id number] Class: Historical Med   Order #: [id number] Class: Normal   Order #: [id number] Class: Historical Med   Order #: [id number] Class: Normal   Order #: [id number] Class: Historical Med   Order #: [id number] Class: Historical Med   Order #: [id number] Class: Normal   Order #: [id number] Class: Historical Med   Order #: [id number] Class: Normal   Order #: [id number] Class: Normal    Past Surgical History:  Procedure Laterality Date  CAROTID PTA/STENT INTERVENTION Right 09/11/2021   Procedure: CAROTID PTA/STENT INTERVENTION;  Surgeon: [name], MD;  Location: [facility];  Service: Cardiovascular;  Laterality: Right;   CATARACT EXTRACTION, BILATERAL     COLONOSCOPY  04/27/2005   COLONOSCOPY WITH PROPOFOL  N/A 06/08/2015   Procedure: COLONOSCOPY WITH PROPOFOL ;  Surgeon: Reyes LELON Cota, MD;  Location: [facility];  Service: Endoscopy;  Laterality: N/A;   CORONARY ANGIOPLASTY WITH STENT PLACEMENT Left 12/11/2010   Procedure: CORONARY ANGIOPLASTY WITH STENT PLACEMENT; Location: [facility]; Surgeon: Margie Lovelace, MD   CORONARY ANGIOPLASTY WITH STENT PLACEMENT Left 12/19/2018   Procedure: CORONARY ANGIOPLASTY WITH STENT PLACEMENT; Location: [facility]   CORONARY STENT INTERVENTION Left 01/05/2011    Procedure: CORONARY STENT INTERVENTION; Location: [facility]; Surgeon: Margie Lovelace, MD   CYSTOSCOPY WITH INSERTION OF UROLIFT N/A 02/11/2020   Procedure: CYSTOSCOPY WITH INSERTION OF UROLIFT;  Surgeon: Kassie Ozell SAUNDERS, MD;  Location: [facility] OR[facility];  Service: Urology;  Laterality: N/A;   CYSTOSCOPY WITH STENT PLACEMENT Right 09/16/2016   Procedure: CYSTOSCOPY WITH STENT PLACEMENT;  Surgeon: Kassie Ozell SAUNDERS, MD;  Location: [facility] OR[facility];  Service: Urology;  Laterality: Right;   EXTRACORPOREAL SHOCK WAVE LITHOTRIPSY Left 11/04/2014   has had 2 previous lithotripsies   EXTRACORPOREAL SHOCK WAVE LITHOTRIPSY Left 03/24/2015   Procedure: EXTRACORPOREAL SHOCK WAVE LITHOTRIPSY (ESWL);  Surgeon: Ozell SAUNDERS Kassie, MD;  Location: [facility];  Service: Urology;  Laterality: Left;   EXTRACORPOREAL SHOCK WAVE LITHOTRIPSY Right 10/04/2016   Procedure: EXTRACORPOREAL SHOCK WAVE LITHOTRIPSY (ESWL);  Surgeon: Kassie Ozell SAUNDERS, MD;  Location: [facility];  Service: Urology;  Laterality: Right;   LEFT HEART CATH AND CORONARY ANGIOGRAPHY N/A 12/05/2018   Procedure: LEFT HEART CATH AND CORONARY ANGIOGRAPHY with possible pci and stent;  Surgeon: Lovelace Cara BIRCH, MD;  Location: [facility];  Service: Cardiovascular;  Laterality: N/A;   PORTA CATH INSERTION Right 09/25/2021   Procedure: PORTA CATH INSERTION;  Surgeon: [name], MD;  Location: [facility];  Service: Cardiovascular;  Laterality: Right;   VIDEO BRONCHOSCOPY WITH ENDOBRONCHIAL NAVIGATION N/A 08/02/2023   Procedure: VIDEO BRONCHOSCOPY WITH ENDOBRONCHIAL NAVIGATION;  Surgeon: [name], MD;  Location: [facility];  Service: Thoracic;  Laterality: N/A;   VIDEO BRONCHOSCOPY WITH ENDOBRONCHIAL ULTRASOUND N/A 08/02/2023   Procedure: BRONCHOSCOPY, WITH EBUS;  Surgeon: [name], MD;  Location: [facility];  Service: Thoracic;  Laterality: N/A;    Physical Exam   Triage Vital Signs: ED Triage Vitals [09/14/23 1355]  Encounter Vitals Group     BP  134/81     Girls Systolic BP Percentile      Girls Diastolic BP Percentile      Boys Systolic BP Percentile      Boys Diastolic BP Percentile      Pulse Rate 99     Resp 20     Temp 98.1 F (36.7 C)     Temp Source Oral     SpO2 98 %     Weight      Height      Head Circumference      Peak Flow      Pain Score 8     Pain Loc      Pain Education      Exclude from Growth Chart     Most recent vital signs: Vitals:   09/14/23 2055 09/14/23 2200  BP: (!) 151/82 136/86  Pulse: 93 94  Resp: 20 (!) 21  Temp: 99.1 F (37.3 C)  SpO2: 97% 94%    General: Awake, no distress.  CV:  Good peripheral perfusion.  Regular rate rhythm Resp:  Normal effort.  Abd:  No distention.  Other:  Bilateral TMs with impacted cerumen.  No exudate or edema.  No mastoid tenderness.  There is tenderness at the left TMJ.  There is erythema and pharyngitis with scattered patches of thrush in the oropharynx   ED Results / Procedures / Treatments   Labs (all labs ordered are listed, but only abnormal results are displayed) Labs Reviewed  BASIC METABOLIC PANEL WITH GFR - Abnormal; Notable for the following components:      Result Value   Sodium 133 (*)    Glucose, Bld 221 (*)    BUN 26 (*)    Calcium  8.8 (*)    All other components within normal limits  CBC - Abnormal; Notable for the following components:   WBC 17.0 (*)    RDW 15.8 (*)    All other components within normal limits  TROPONIN I (HIGH SENSITIVITY)  TROPONIN I (HIGH SENSITIVITY)     EKG Interpreted by me Sinus rhythm rate of 97, normal axis intervals QRS ST segments T waves   RADIOLOGY Chest x-ray interpreted by me, shows known right lung mass.  Radiology report reviewed   PROCEDURES:  Procedures   MEDICATIONS ORDERED IN ED: Medications  nystatin  (MYCOSTATIN ) 100000 UNIT/ML suspension 500,000 Units (500,000 Units Mouth/Throat Given 09/14/23 1603)  lidocaine -prilocaine  (EMLA ) cream ( Topical Given 09/14/23 1603)   iohexol  (OMNIPAQUE ) 300 MG/ML solution 75 mL (75 mLs Intravenous Contrast Given 09/14/23 1959)  heparin  lock flush 100 unit/mL (500 Units Intravenous Given 09/14/23 2217)     IMPRESSION / MDM / ASSESSMENT AND PLAN / ED COURSE  I reviewed the triage vital signs and the nursing notes.  DDx: TMJ, left external canal impacted cerumen, otitis media, thrush pharyngitis, parapharyngeal abscess  Patient's presentation is most consistent with acute presentation with potential threat to life or bodily function.  Patient with complicated comorbidities, on long-term steroid therapy related to recurrent lung cancer undergoing radiation therapy who comes with left jaw and throat pain.  Most likely related to thrush and pharyngitis combined with impacted cerumen.  Will obtain CT neck to further evaluate due to leukocytosis, which may also be due to Decadron  use.  Doubt ACS PE dissection or venous occlusion..   Clinical Course as of 09/14/23 2223  Sat Sep 14, 2023  2213 Workup all unremarkable. Will tx for thrush, f/u with current onc team. [PS]    Clinical Course User Index [PS] Viviann Pastor, MD    ----------------------------------------- 10:23 PM on 09/14/2023 ----------------------------------------- CT unremarkable.  Stable for discharge, nystatin  suspension.    FINAL CLINICAL IMPRESSION(S) / ED DIAGNOSES   Final diagnoses:  Thrush, oral  Malignant neoplasm of right lung, unspecified part of lung (HCC)     Rx / DC Orders   ED Discharge Orders          Ordered    nystatin  (MYCOSTATIN ) 100000 UNIT/ML suspension  4 times daily        09/14/23 2223             Note:  This document was prepared using Dragon voice recognition software and may include unintentional dictation errors.   Viviann Pastor, MD 09/14/23 1617    Viviann Pastor, MD 09/14/23 2223

## 2023-09-14 NOTE — ED Triage Notes (Signed)
 Pt to Ed via POV from home. Pt reports left ear/head pain that started yesterday. Pt reports pain come down neck and into shoulder. Pt reports cough present for awhile. Pt currently on radiation for lung cancer. Denies fever or N/V/D. Pt is on blood thinner.

## 2023-09-14 NOTE — ED Notes (Signed)
 2 unusccessful IV attempts bilateral AC; CT called and agrees to use port access, ok per stafford, MD.

## 2023-09-14 NOTE — ED Notes (Signed)
Port de accessed at this time.

## 2023-09-16 ENCOUNTER — Inpatient Hospital Stay

## 2023-09-16 ENCOUNTER — Other Ambulatory Visit: Payer: Self-pay

## 2023-09-16 ENCOUNTER — Ambulatory Visit
Admission: RE | Admit: 2023-09-16 | Discharge: 2023-09-16 | Disposition: A | Discharge status: Home / Self Care | Source: Ambulatory Visit | Attending: Radiation Oncology | Admitting: Radiation Oncology

## 2023-09-16 DIAGNOSIS — C3411 Malignant neoplasm of upper lobe, right bronchus or lung: Secondary | ICD-10-CM | POA: Diagnosis not present

## 2023-09-16 DIAGNOSIS — Z510 Encounter for antineoplastic radiation therapy: Secondary | ICD-10-CM | POA: Diagnosis not present

## 2023-09-16 DIAGNOSIS — R918 Other nonspecific abnormal finding of lung field: Secondary | ICD-10-CM

## 2023-09-16 DIAGNOSIS — Z87891 Personal history of nicotine dependence: Secondary | ICD-10-CM | POA: Diagnosis not present

## 2023-09-16 LAB — CBC (CANCER CENTER ONLY)
HCT: 42.7 % (ref 39.0–52.0)
Hemoglobin: 14.6 g/dL (ref 13.0–17.0)
MCH: 30.5 pg (ref 26.0–34.0)
MCHC: 34.2 g/dL (ref 30.0–36.0)
MCV: 89.3 fL (ref 80.0–100.0)
Platelet Count: 141 K/uL — ABNORMAL LOW (ref 150–400)
RBC: 4.78 MIL/uL (ref 4.22–5.81)
RDW: 15.9 % — ABNORMAL HIGH (ref 11.5–15.5)
WBC Count: 12 K/uL — ABNORMAL HIGH (ref 4.0–10.5)
nRBC: 0 % (ref 0.0–0.2)

## 2023-09-16 LAB — RAD ONC ARIA SESSION SUMMARY
Course Elapsed Days: 7
Plan Fractions Treated to Date: 6
Plan Prescribed Dose Per Fraction: 4 Gy
Plan Total Fractions Prescribed: 15
Plan Total Prescribed Dose: 60 Gy
Reference Point Dosage Given to Date: 24 Gy
Reference Point Session Dosage Given: 4 Gy
Session Number: 6

## 2023-09-17 ENCOUNTER — Other Ambulatory Visit: Payer: Self-pay

## 2023-09-17 ENCOUNTER — Other Ambulatory Visit: Payer: Self-pay | Admitting: *Deleted

## 2023-09-17 ENCOUNTER — Ambulatory Visit
Admission: RE | Admit: 2023-09-17 | Discharge: 2023-09-17 | Disposition: A | Discharge status: Home / Self Care | Source: Ambulatory Visit | Attending: Radiation Oncology | Admitting: Radiation Oncology

## 2023-09-17 DIAGNOSIS — Z510 Encounter for antineoplastic radiation therapy: Secondary | ICD-10-CM | POA: Diagnosis not present

## 2023-09-17 DIAGNOSIS — C3411 Malignant neoplasm of upper lobe, right bronchus or lung: Secondary | ICD-10-CM | POA: Diagnosis not present

## 2023-09-17 DIAGNOSIS — Z87891 Personal history of nicotine dependence: Secondary | ICD-10-CM | POA: Diagnosis not present

## 2023-09-17 LAB — RAD ONC ARIA SESSION SUMMARY
Course Elapsed Days: 8
Plan Fractions Treated to Date: 7
Plan Prescribed Dose Per Fraction: 4 Gy
Plan Total Fractions Prescribed: 15
Plan Total Prescribed Dose: 60 Gy
Reference Point Dosage Given to Date: 28 Gy
Reference Point Session Dosage Given: 4 Gy
Session Number: 7

## 2023-09-17 MED ORDER — FLUCONAZOLE 100 MG PO TABS: 100.0000 mg | ORAL_TABLET | Freq: Every day | ORAL | 0 refills | Status: DC

## 2023-09-18 ENCOUNTER — Other Ambulatory Visit: Payer: Self-pay

## 2023-09-18 ENCOUNTER — Ambulatory Visit
Admission: RE | Admit: 2023-09-18 | Discharge: 2023-09-18 | Disposition: A | Discharge status: Home / Self Care | Source: Ambulatory Visit | Attending: Radiation Oncology | Admitting: Radiation Oncology

## 2023-09-18 DIAGNOSIS — C3411 Malignant neoplasm of upper lobe, right bronchus or lung: Secondary | ICD-10-CM | POA: Diagnosis not present

## 2023-09-18 DIAGNOSIS — Z87891 Personal history of nicotine dependence: Secondary | ICD-10-CM | POA: Diagnosis not present

## 2023-09-18 DIAGNOSIS — Z510 Encounter for antineoplastic radiation therapy: Secondary | ICD-10-CM | POA: Diagnosis not present

## 2023-09-18 LAB — RAD ONC ARIA SESSION SUMMARY
Course Elapsed Days: 9
Plan Fractions Treated to Date: 8
Plan Prescribed Dose Per Fraction: 4 Gy
Plan Total Fractions Prescribed: 15
Plan Total Prescribed Dose: 60 Gy
Reference Point Dosage Given to Date: 32 Gy
Reference Point Session Dosage Given: 4 Gy
Session Number: 8

## 2023-09-19 ENCOUNTER — Ambulatory Visit
Admission: RE | Admit: 2023-09-19 | Discharge: 2023-09-19 | Disposition: A | Discharge status: Home / Self Care | Source: Ambulatory Visit | Attending: Radiation Oncology | Admitting: Radiation Oncology

## 2023-09-19 ENCOUNTER — Other Ambulatory Visit: Payer: Self-pay

## 2023-09-19 DIAGNOSIS — Z87891 Personal history of nicotine dependence: Secondary | ICD-10-CM | POA: Diagnosis not present

## 2023-09-19 DIAGNOSIS — C3411 Malignant neoplasm of upper lobe, right bronchus or lung: Secondary | ICD-10-CM | POA: Diagnosis not present

## 2023-09-19 DIAGNOSIS — Z510 Encounter for antineoplastic radiation therapy: Secondary | ICD-10-CM | POA: Diagnosis not present

## 2023-09-19 LAB — RAD ONC ARIA SESSION SUMMARY
Course Elapsed Days: 10
Plan Fractions Treated to Date: 9
Plan Prescribed Dose Per Fraction: 4 Gy
Plan Total Fractions Prescribed: 15
Plan Total Prescribed Dose: 60 Gy
Reference Point Dosage Given to Date: 36 Gy
Reference Point Session Dosage Given: 4 Gy
Session Number: 9

## 2023-09-20 ENCOUNTER — Ambulatory Visit
Admission: RE | Admit: 2023-09-20 | Discharge: 2023-09-20 | Disposition: A | Discharge status: Home / Self Care | Source: Ambulatory Visit | Attending: Radiation Oncology | Admitting: Radiation Oncology

## 2023-09-20 ENCOUNTER — Other Ambulatory Visit: Payer: Self-pay

## 2023-09-20 DIAGNOSIS — C3411 Malignant neoplasm of upper lobe, right bronchus or lung: Secondary | ICD-10-CM | POA: Diagnosis not present

## 2023-09-20 DIAGNOSIS — Z87891 Personal history of nicotine dependence: Secondary | ICD-10-CM | POA: Diagnosis not present

## 2023-09-20 DIAGNOSIS — Z510 Encounter for antineoplastic radiation therapy: Secondary | ICD-10-CM | POA: Diagnosis not present

## 2023-09-20 LAB — RAD ONC ARIA SESSION SUMMARY
Course Elapsed Days: 11
Plan Fractions Treated to Date: 10
Plan Prescribed Dose Per Fraction: 4 Gy
Plan Total Fractions Prescribed: 15
Plan Total Prescribed Dose: 60 Gy
Reference Point Dosage Given to Date: 40 Gy
Reference Point Session Dosage Given: 4 Gy
Session Number: 10

## 2023-09-23 ENCOUNTER — Inpatient Hospital Stay

## 2023-09-23 ENCOUNTER — Ambulatory Visit
Admission: RE | Admit: 2023-09-23 | Discharge: 2023-09-23 | Disposition: A | Discharge status: Home / Self Care | Source: Ambulatory Visit | Attending: Radiation Oncology | Admitting: Radiation Oncology

## 2023-09-23 ENCOUNTER — Other Ambulatory Visit: Payer: Self-pay

## 2023-09-23 DIAGNOSIS — R918 Other nonspecific abnormal finding of lung field: Secondary | ICD-10-CM

## 2023-09-23 DIAGNOSIS — C3411 Malignant neoplasm of upper lobe, right bronchus or lung: Secondary | ICD-10-CM | POA: Diagnosis not present

## 2023-09-23 DIAGNOSIS — Z87891 Personal history of nicotine dependence: Secondary | ICD-10-CM | POA: Diagnosis not present

## 2023-09-23 DIAGNOSIS — Z510 Encounter for antineoplastic radiation therapy: Secondary | ICD-10-CM | POA: Diagnosis not present

## 2023-09-23 LAB — CBC (CANCER CENTER ONLY)
HCT: 42 % (ref 39.0–52.0)
Hemoglobin: 14.2 g/dL (ref 13.0–17.0)
MCH: 30.2 pg (ref 26.0–34.0)
MCHC: 33.8 g/dL (ref 30.0–36.0)
MCV: 89.4 fL (ref 80.0–100.0)
Platelet Count: 146 K/uL — ABNORMAL LOW (ref 150–400)
RBC: 4.7 MIL/uL (ref 4.22–5.81)
RDW: 15.5 % (ref 11.5–15.5)
WBC Count: 5.2 K/uL (ref 4.0–10.5)
nRBC: 0 % (ref 0.0–0.2)

## 2023-09-23 LAB — RAD ONC ARIA SESSION SUMMARY
Course Elapsed Days: 14
Plan Fractions Treated to Date: 11
Plan Prescribed Dose Per Fraction: 4 Gy
Plan Total Fractions Prescribed: 15
Plan Total Prescribed Dose: 60 Gy
Reference Point Dosage Given to Date: 44 Gy
Reference Point Session Dosage Given: 4 Gy
Session Number: 11

## 2023-09-24 ENCOUNTER — Ambulatory Visit
Admission: RE | Admit: 2023-09-24 | Discharge: 2023-09-24 | Disposition: A | Discharge status: Home / Self Care | Source: Ambulatory Visit | Attending: Radiation Oncology | Admitting: Radiation Oncology

## 2023-09-24 ENCOUNTER — Other Ambulatory Visit: Payer: Self-pay

## 2023-09-24 DIAGNOSIS — C3411 Malignant neoplasm of upper lobe, right bronchus or lung: Secondary | ICD-10-CM | POA: Diagnosis not present

## 2023-09-24 DIAGNOSIS — Z87891 Personal history of nicotine dependence: Secondary | ICD-10-CM | POA: Diagnosis not present

## 2023-09-24 DIAGNOSIS — Z510 Encounter for antineoplastic radiation therapy: Secondary | ICD-10-CM | POA: Diagnosis not present

## 2023-09-24 LAB — RAD ONC ARIA SESSION SUMMARY
Course Elapsed Days: 15
Plan Fractions Treated to Date: 12
Plan Prescribed Dose Per Fraction: 4 Gy
Plan Total Fractions Prescribed: 15
Plan Total Prescribed Dose: 60 Gy
Reference Point Dosage Given to Date: 48 Gy
Reference Point Session Dosage Given: 4 Gy
Session Number: 12

## 2023-09-25 ENCOUNTER — Other Ambulatory Visit: Payer: Self-pay

## 2023-09-25 ENCOUNTER — Ambulatory Visit
Admission: RE | Admit: 2023-09-25 | Discharge: 2023-09-25 | Disposition: A | Discharge status: Home / Self Care | Source: Ambulatory Visit | Attending: Radiation Oncology | Admitting: Radiation Oncology

## 2023-09-25 DIAGNOSIS — C3411 Malignant neoplasm of upper lobe, right bronchus or lung: Secondary | ICD-10-CM | POA: Diagnosis not present

## 2023-09-25 DIAGNOSIS — Z87891 Personal history of nicotine dependence: Secondary | ICD-10-CM | POA: Diagnosis not present

## 2023-09-25 DIAGNOSIS — Z510 Encounter for antineoplastic radiation therapy: Secondary | ICD-10-CM | POA: Diagnosis not present

## 2023-09-25 LAB — RAD ONC ARIA SESSION SUMMARY
Course Elapsed Days: 16
Plan Fractions Treated to Date: 13
Plan Prescribed Dose Per Fraction: 4 Gy
Plan Total Fractions Prescribed: 15
Plan Total Prescribed Dose: 60 Gy
Reference Point Dosage Given to Date: 52 Gy
Reference Point Session Dosage Given: 4 Gy
Session Number: 13

## 2023-09-26 ENCOUNTER — Ambulatory Visit
Admission: RE | Admit: 2023-09-26 | Discharge: 2023-09-26 | Disposition: A | Discharge status: Home / Self Care | Source: Ambulatory Visit | Attending: Radiation Oncology | Admitting: Radiation Oncology

## 2023-09-26 ENCOUNTER — Other Ambulatory Visit: Payer: Self-pay

## 2023-09-26 DIAGNOSIS — C3411 Malignant neoplasm of upper lobe, right bronchus or lung: Secondary | ICD-10-CM | POA: Diagnosis not present

## 2023-09-26 DIAGNOSIS — Z87891 Personal history of nicotine dependence: Secondary | ICD-10-CM | POA: Diagnosis not present

## 2023-09-26 DIAGNOSIS — Z510 Encounter for antineoplastic radiation therapy: Secondary | ICD-10-CM | POA: Diagnosis not present

## 2023-09-26 LAB — RAD ONC ARIA SESSION SUMMARY
Course Elapsed Days: 17
Plan Fractions Treated to Date: 14
Plan Prescribed Dose Per Fraction: 4 Gy
Plan Total Fractions Prescribed: 15
Plan Total Prescribed Dose: 60 Gy
Reference Point Dosage Given to Date: 56 Gy
Reference Point Session Dosage Given: 4 Gy
Session Number: 14

## 2023-09-27 ENCOUNTER — Ambulatory Visit
Admission: RE | Admit: 2023-09-27 | Discharge: 2023-09-27 | Disposition: A | Discharge status: Home / Self Care | Source: Ambulatory Visit | Attending: Radiation Oncology | Admitting: Radiation Oncology

## 2023-09-27 ENCOUNTER — Other Ambulatory Visit: Payer: Self-pay

## 2023-09-27 DIAGNOSIS — Z510 Encounter for antineoplastic radiation therapy: Secondary | ICD-10-CM | POA: Diagnosis not present

## 2023-09-27 DIAGNOSIS — C3411 Malignant neoplasm of upper lobe, right bronchus or lung: Secondary | ICD-10-CM | POA: Diagnosis not present

## 2023-09-27 DIAGNOSIS — Z87891 Personal history of nicotine dependence: Secondary | ICD-10-CM | POA: Diagnosis not present

## 2023-09-27 LAB — RAD ONC ARIA SESSION SUMMARY
Course Elapsed Days: 18
Plan Fractions Treated to Date: 15
Plan Prescribed Dose Per Fraction: 4 Gy
Plan Total Fractions Prescribed: 15
Plan Total Prescribed Dose: 60 Gy
Reference Point Dosage Given to Date: 60 Gy
Reference Point Session Dosage Given: 4 Gy
Session Number: 15

## 2023-09-30 NOTE — Radiation Completion Notes (Signed)
 Patient Name: Luke Rojas, Luke Rojas MRN: [id number] Date of Birth: Dec 31, 1945 Referring Physician: JON EVA, M.D. Date of Service: 2023-09-30 Radiation Oncologist: Marcey Penton, M.D.  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  PET CT scan in May showed hypermetabolic activity in the right upper lobe mass measuring 4.6 x 4.5 cm.  Did have some slight scattered mediastinal nodes although they have been stable in size over time.  Patient underwent bronchoscopy and right upper lung lavage was positive for squamous cell carcinoma and an FA was positive again for squamous cell carcinoma.  FNA from station 4R 10L and lymph node station 7 were all negative for malignancy.  Case was presented at tumor conference he has seen Dr. Melanee.  In the absence of local regional nodal involvement he will not receive systemic treatment.  He is seen today for consideration of salvage treatment.  Patient clinically is doing fairly well he states his dyspnea on exertion is not that significant p.o. intake is good he is having no dysphagia.  He is having no cough or hemoptysis at this time      ==========DELIVERED PLANS==========  First Treatment Date: 2023-09-09 Last Treatment Date: 2023-09-27   Plan Name: Lung_[id number] Site: Lung, Right Technique: IMRT Mode:  Photon Dose Per Fraction: 4 Gy Prescribed Dose (Delivered / Prescribed): 60 Gy / 60 Gy Prescribed Fxs (Delivered / Prescribed): 15 / 15     ==========ON TREATMENT VISIT DATES========== 2023-09-10, 2023-09-17, 2023-09-24     ==========UPCOMING VISITS========== 11/22/2023 [facility] ONC EST PT [name], MD  11/07/2023 [facility]-CT IMAGING CT CHEST ABD/PELVIS W CONTRAST [facility]-CT 2  10/30/2023 [facility] RAD ONCOLOGY FOLLOW UP 30 Chrystal, Marcey, MD        ==========APPENDIX - ON TREATMENT VISIT NOTES==========   See weekly On Treatment Notes in Epic for details in the Media tab (listed as Progress notes on the On Treatment Visit Dates listed above).

## 2023-10-01 ENCOUNTER — Encounter: Payer: Self-pay | Admitting: *Deleted

## 2023-10-01 NOTE — Progress Notes (Signed)
 Message left with Angie at [facility] on 7/28 to get results or status update on PDL1 requested on sample [id number]. Requested results be faxed to [phone] and direct number left for her to call back with any updates.

## 2023-10-04 ENCOUNTER — Other Ambulatory Visit: Payer: Self-pay | Admitting: Physician Assistant

## 2023-10-04 DIAGNOSIS — H6123 Impacted cerumen, bilateral: Secondary | ICD-10-CM | POA: Diagnosis not present

## 2023-10-04 DIAGNOSIS — J329 Chronic sinusitis, unspecified: Secondary | ICD-10-CM

## 2023-10-04 DIAGNOSIS — H93293 Other abnormal auditory perceptions, bilateral: Secondary | ICD-10-CM | POA: Diagnosis not present

## 2023-10-07 NOTE — Telephone Encounter (Signed)
 Requested Prescriptions  Pending Prescriptions Disp Refills   cetirizine  (ZYRTEC ) 10 MG tablet [Pharmacy Med Name: CETIRIZINE  HYDROCHLORIDE 10MG  TABLET] 45 tablet 0    Sig: TAKE ONE HALF (1/2) TABLET BY MOUTH DAILY     Ear, Nose, and Throat:  Antihistamines 2 Passed - 10/07/2023  8:22 AM      Passed - Cr in normal range and within 360 days    Creatinine  Date Value Ref Range Status  01/22/2023 1.23 0.61 - 1.24 mg/dL Final  92/69/7986 8.11 (H) 0.60 - 1.30 mg/dL Final   Creatinine, Ser  Date Value Ref Range Status  09/14/2023 0.98 0.61 - 1.24 mg/dL Final         Passed - Valid encounter within last 12 months    Recent Outpatient Visits           2 months ago Hearing loss of both ears due to cerumen impaction   [facility] Bernardo Fend, DO   4 months ago Orthostatic hypotension   [facility] Bernardo Fend, DO   4 months ago Orthostatic hypotension   [facility] Bernardo Fend, DO   5 months ago Encounter for annual physical exam   [facility], Jon HERO, MD

## 2023-10-09 DIAGNOSIS — J449 Chronic obstructive pulmonary disease, unspecified: Secondary | ICD-10-CM | POA: Diagnosis not present

## 2023-10-09 DIAGNOSIS — R0609 Other forms of dyspnea: Secondary | ICD-10-CM | POA: Diagnosis not present

## 2023-10-09 DIAGNOSIS — C3491 Malignant neoplasm of unspecified part of right bronchus or lung: Secondary | ICD-10-CM | POA: Diagnosis not present

## 2023-10-16 ENCOUNTER — Telehealth: Payer: Self-pay | Admitting: *Deleted

## 2023-10-16 NOTE — Telephone Encounter (Signed)
 Attempt #2 to follow up on PDL1 results ordered on 7/2 on specimen [id number]. LVM with Angie requesting to fax results to [phone]. Contact number left for Angie to call back if needed.

## 2023-10-18 ENCOUNTER — Telehealth: Payer: Self-pay | Admitting: *Deleted

## 2023-10-18 NOTE — Telephone Encounter (Signed)
 Spoke to Angie at [facility] to follow up PDL1 results. Per Angie, report has not been received from [facility] at this time. She will call [facility] to get the report and fax to us . Fax number confirmed with Angie.

## 2023-10-21 ENCOUNTER — Encounter: Payer: Self-pay | Admitting: Oncology

## 2023-10-30 ENCOUNTER — Other Ambulatory Visit: Payer: Self-pay | Admitting: *Deleted

## 2023-10-30 ENCOUNTER — Encounter: Payer: Self-pay | Admitting: Radiation Oncology

## 2023-10-30 ENCOUNTER — Ambulatory Visit
Admission: RE | Admit: 2023-10-30 | Discharge: 2023-10-30 | Disposition: A | Discharge status: Home / Self Care | Source: Ambulatory Visit | Attending: Radiation Oncology | Admitting: Radiation Oncology

## 2023-10-30 VITALS — BP 124/80 | HR 88 | Temp 98.0°F | Resp 16

## 2023-10-30 DIAGNOSIS — R918 Other nonspecific abnormal finding of lung field: Secondary | ICD-10-CM

## 2023-10-30 NOTE — Progress Notes (Signed)
 Radiation Oncology Follow up Note  Name: Luke Rojas   Date:   10/30/2023 MRN:  [id number] DOB: [birth year]    This 78 y.o. 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  REFERRING PROVIDER: [name], MD  HPI: Patient is a 78 year old male now out 1 month having completed salvage radiation therapy for initial stage IIIa non-small cell lung cancer favoring squamous cell carcinoma the right lung.  Seen today in routine follow-up he is doing fairly well.  He still has a mild productive cough no hemoptysis chest tightness no significant change in his pulmonary status..  He has follow-up CT scans ordered in early September.  He is being followed by medical oncology.  Patient does use a nebulizer although was recently prescribed by pulmonolog yencephentrine (Ohtuvayre ) nebulization although this has been declined by his insurance company.  They are working on that.  COMPLICATIONS OF TREATMENT: none  FOLLOW UP COMPLIANCE: keeps appointments   PHYSICAL EXAM:  BP 124/80   Pulse 88   Temp 98 F (36.7 C) (Tympanic)   Resp 16  Well-developed well-nourished patient in NAD. HEENT reveals PERLA, EOMI, discs not visualized.  Oral cavity is clear. No oral mucosal lesions are identified. Neck is clear without evidence of cervical or supraclavicular adenopathy. Lungs are clear to A&P. Cardiac examination is essentially unremarkable with regular rate and rhythm without murmur rub or thrill. Abdomen is benign with no organomegaly or masses noted. Motor sensory and DTR levels are equal and symmetric in the upper and lower extremities. Cranial nerves II through XII are grossly intact. Proprioception is intact. No peripheral adenopathy or edema is identified. No  motor or sensory levels are noted. Crude visual fields are within normal range.  RADIOLOGY RESULTS: CT scan ordered for 3 to 4 months prior to next follow-up  PLAN: Present time patient is stable no significant major side effects from his salvage radiation therapy.  Patient continues close follow-up care with medical oncology as well as pulmonology.  We have asked to see him back in 3 to 4 months we will have a CT scan performed prior to that visit if not already been done.  Patient knows to call with any concerns.  I would like to take this opportunity to thank you for allowing me to participate in the care of your patient.SABRA Marcey Penton, MD

## 2023-10-31 DIAGNOSIS — M7918 Myalgia, other site: Secondary | ICD-10-CM | POA: Diagnosis not present

## 2023-10-31 DIAGNOSIS — M5416 Radiculopathy, lumbar region: Secondary | ICD-10-CM | POA: Diagnosis not present

## 2023-11-07 ENCOUNTER — Ambulatory Visit
Admission: RE | Admit: 2023-11-07 | Discharge: 2023-11-07 | Disposition: A | Discharge status: Home / Self Care | Source: Ambulatory Visit | Attending: Oncology | Admitting: Oncology

## 2023-11-07 DIAGNOSIS — J90 Pleural effusion, not elsewhere classified: Secondary | ICD-10-CM | POA: Diagnosis not present

## 2023-11-07 DIAGNOSIS — I700 Atherosclerosis of aorta: Secondary | ICD-10-CM | POA: Diagnosis not present

## 2023-11-07 DIAGNOSIS — C3411 Malignant neoplasm of upper lobe, right bronchus or lung: Secondary | ICD-10-CM | POA: Insufficient documentation

## 2023-11-07 DIAGNOSIS — C3490 Malignant neoplasm of unspecified part of unspecified bronchus or lung: Secondary | ICD-10-CM | POA: Diagnosis not present

## 2023-11-07 LAB — POCT I-STAT CREATININE: Creatinine, Ser: 1.1 mg/dL (ref 0.61–1.24)

## 2023-11-07 MED ORDER — IOHEXOL 300 MG/ML  SOLN
100.0000 mL | Freq: Once | INTRAMUSCULAR | Status: AC | PRN
  Administered 2023-11-07: 100 mL via INTRAVENOUS

## 2023-11-19 DIAGNOSIS — H16213 Exposure keratoconjunctivitis, bilateral: Secondary | ICD-10-CM | POA: Diagnosis not present

## 2023-11-20 DIAGNOSIS — M5416 Radiculopathy, lumbar region: Secondary | ICD-10-CM | POA: Diagnosis not present

## 2023-11-22 ENCOUNTER — Inpatient Hospital Stay

## 2023-11-22 ENCOUNTER — Inpatient Hospital Stay: Attending: Oncology | Admitting: Oncology

## 2023-11-22 ENCOUNTER — Encounter: Payer: Self-pay | Admitting: Oncology

## 2023-11-22 ENCOUNTER — Other Ambulatory Visit: Payer: Self-pay

## 2023-11-22 DIAGNOSIS — Z452 Encounter for adjustment and management of vascular access device: Secondary | ICD-10-CM | POA: Insufficient documentation

## 2023-11-22 DIAGNOSIS — Z85118 Personal history of other malignant neoplasm of bronchus and lung: Secondary | ICD-10-CM | POA: Diagnosis not present

## 2023-11-22 DIAGNOSIS — Z08 Encounter for follow-up examination after completed treatment for malignant neoplasm: Secondary | ICD-10-CM

## 2023-11-22 DIAGNOSIS — C3411 Malignant neoplasm of upper lobe, right bronchus or lung: Secondary | ICD-10-CM

## 2023-11-22 DIAGNOSIS — C3490 Malignant neoplasm of unspecified part of unspecified bronchus or lung: Secondary | ICD-10-CM

## 2023-11-22 NOTE — Progress Notes (Signed)
 Taking Ramipril  once a day per lung doctor.  Gets to coughing when talking too much; uses nebulizer tries to do it twice daily.

## 2023-11-23 ENCOUNTER — Encounter: Payer: Self-pay | Admitting: Oncology

## 2023-11-23 NOTE — Progress Notes (Signed)
 Hematology/Oncology Consult note [facility]  Telephone:([phone] Fax:([phone]) [phone]  Patient Care Team: [name], MD as PCP - General (Family Medicine) [name], OD as Consulting Physician (Optometry) [name], MD as Consulting Physician (Cardiology) [name], RN as Oncology Nurse Navigator [name], [name], MD (Urology) [name], MD as Consulting Physician (Oncology) [name], MD as Consulting Physician (Pulmonary Disease) [name], MD as Referring Physician (Vascular Surgery) [name], MD as Consulting Physician (Radiation Oncology)   Name of the patient: Luke Rojas  [id number]  [birth year]   Date of visit: 11/23/23  Diagnosis- 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  He quit smoking about 30 years ago but still chews tobacco.  He presented to the ER with symptoms of neck pain which led to an MRI cervical spine as well as CT angio chest.  MRI cervical spine showed degenerative changes along with moderate spinal stenosis and mass effect between C3-C4 and C4-C5.  Foraminal stenosis at C3-C4 C5-C6-C7 nerve levels.  Nonspecific 10 mm T3 vertebral body lesion indeterminate for bone metastases.  CT angio chest showed a 4.5 x 4.9 x 3.7 cm right upper lobe peripheral mass with probable pleural involvement and potential early invasion into right middle lobe.  Borderline enlarged right hilar lymph nodes measuring up to 1.2 cm.   PET CT scan showed hypermetabolic 4.8 cm right upper lobe mass consistent with primary bronchogenic carcinoma and mild asymmetric FDG activity in the right suprahilar region suspicious for right hilar lymph node metastases.   Patient had a CT-guided right upper lobe lung biopsy which was consistent with squamous cell carcinoma.  Completed concurrent chemoradiation with weekly CarboTaxol in September 2023.CT angio chest in May 2025 which was done for worsening shortness of breath showed interval increase in the size of the right upper lobe mass from 2 cm to 4.9 cm.  This was followed by a PET CT scan on 07/19/2023 which showed SUV uptake of 12.5 in the right upper lobe lung mass measuring 4.6 x 4.5 cm.  Some uptake within scattered mediastinal lymph nodes which have remained unchanged since November 2024 with an SUV uptake of 3.1.  Patient underwent bronchoscopy by Dr. Stella Melanee on 08/02/2023.  Right upper lobe lung lavage was positive for squamous cell carcinoma and FNA was positive for squamous cell carcinoma well.  FNA from station 4R 10L and lymph node station 7 negative for malignancy.  Patient was noted to have concern for recurrent disease involving the right upper lobe in June 2025 with increase in the size of the mass from 2 cm to 4.6 cm with an SUV of 12.5.  He underwent SBRT for 10 fractions  Interval history-patient presently feels well.SABRA  He does have occasional cough and he follows up with pulmonary as well as uses his nebulizer regularly  ECOG PS- 2 Pain scale- 0  Review of systems- Review of Systems  Constitutional:  Positive for malaise/fatigue. Negative for chills, fever and weight loss.  HENT:  Negative for congestion, ear discharge and nosebleeds.   Eyes:  Negative for blurred vision.  Respiratory:  Positive for cough. Negative for hemoptysis, sputum production, shortness of breath and wheezing.   Cardiovascular:  Negative for chest pain,  palpitations, orthopnea and claudication.  Gastrointestinal:  Negative for abdominal pain, blood in stool, constipation, diarrhea, heartburn, melena, nausea and vomiting.  Genitourinary:  Negative for dysuria, flank pain, frequency, hematuria and urgency.   Musculoskeletal:  Negative for back pain, joint pain and myalgias.  Skin:  Negative for rash.  Neurological:  Negative for dizziness, tingling, focal weakness, seizures, weakness and headaches.  Endo/Heme/Allergies:  Does not bruise/bleed easily.  Psychiatric/Behavioral:  Negative for depression and suicidal ideas. The patient does not have insomnia.       Allergies  Allergen Reactions   Codeine Nausea And Vomiting and Nausea Only     Past Medical History:  Diagnosis Date   Aortic atherosclerosis (HCC)    Arthritis    Bilateral carotid artery disease (HCC)    a.) s/p PTA/stenting 09/11/2021: 9 mm x 7 mm x 4 cm Exact stent to RIGHT   BPH (benign prostatic hyperplasia)    Chronic kidney disease    COPD (chronic obstructive pulmonary disease) (HCC)    Coronary artery disease    a.) PCI 12/11/2010: 99% pRCA (3 x 15 mm Xience V DES); b.) staged PCI 01/05/2011: 80 % pLCx (3.5 x 23 mm Xience V DES); c.) PCI 12/19/2018: 75% ISR pRCA (3 x 16 mm Synergy DES), 85% mRCA (3 x 20 mm Synergy DES), 95% mRCA (3 x 16 mm Synergy), 85% p-mLAD (rotational athrectomy + overlapping 3 x 24 mm and 3 x 28 mm Synergy DES)   DDD (degenerative disc disease), cervical    Diverticulosis    Dyspnea    Full dentures    GERD (gastroesophageal reflux disease)    History of heart artery stent 12/11/2010   a.) TOTAL of 7 coronary stents as of 08/01/2023   Hypercholesteremia    Hypertension    Hypokalemia    Long term current use of clopidogrel     Nephrolithiasis    NSTEMI (non-ST elevated myocardial infarction) (HCC) 12/10/2010   a.) PCI 12/11/2010: 99% pRCA (3 x 15 mm Xience V DES); b.) staged PCI 01/05/2011: 80 % pLCx (3.5 x 23 mm Xience V DES)   On long term clopidogrel  therapy    Pneumonia    Renal colic    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   a.) stage II B (cT2, cN1, cM0) s/p concurrent chemoradiation (weekly CarboTaxol + 70 cGy over 7 weeks; completed 12/2021)   T2DM  (type 2 diabetes mellitus) (HCC)      Past Surgical History:  Procedure Laterality Date   CAROTID PTA/STENT INTERVENTION Right 09/11/2021   Procedure: CAROTID PTA/STENT INTERVENTION;  Surgeon: [name], MD;  Location: [facility];  Service: Cardiovascular;  Laterality: Right;   CATARACT EXTRACTION, BILATERAL     COLONOSCOPY  04/27/2005   COLONOSCOPY WITH PROPOFOL  N/A 06/08/2015   Procedure: COLONOSCOPY WITH PROPOFOL ;  Surgeon: Reyes LELON Cota, MD;  Location: [facility];  Service: Endoscopy;  Laterality: N/A;   CORONARY ANGIOPLASTY WITH STENT PLACEMENT Left 12/11/2010   Procedure: CORONARY ANGIOPLASTY WITH STENT PLACEMENT; Location: [facility]; Surgeon: Margie Lovelace, MD   CORONARY ANGIOPLASTY WITH STENT PLACEMENT Left 12/19/2018   Procedure: CORONARY ANGIOPLASTY WITH STENT PLACEMENT; Location: [facility]   CORONARY STENT INTERVENTION Left 01/05/2011   Procedure: CORONARY STENT INTERVENTION; Location: [facility]; Surgeon: Margie Lovelace, MD   CYSTOSCOPY WITH INSERTION OF UROLIFT N/A 02/11/2020   Procedure: CYSTOSCOPY WITH INSERTION OF UROLIFT;  Surgeon: Kassie Ozell SAUNDERS, MD;  Location: [facility];  Service: Urology;  Laterality: N/A;  CYSTOSCOPY WITH STENT PLACEMENT Right 09/16/2016   Procedure: CYSTOSCOPY WITH STENT PLACEMENT;  Surgeon: Kassie Ozell SAUNDERS, MD;  Location: [facility];  Service: Urology;  Laterality: Right;   EXTRACORPOREAL SHOCK WAVE LITHOTRIPSY Left 11/04/2014   has had 2 previous lithotripsies   EXTRACORPOREAL SHOCK WAVE LITHOTRIPSY Left 03/24/2015   Procedure: EXTRACORPOREAL SHOCK WAVE LITHOTRIPSY (ESWL);  Surgeon: Ozell SAUNDERS Kassie, MD;  Location: [facility];  Service: Urology;  Laterality: Left;   EXTRACORPOREAL SHOCK WAVE LITHOTRIPSY Right 10/04/2016   Procedure: EXTRACORPOREAL SHOCK WAVE LITHOTRIPSY (ESWL);  Surgeon: Kassie Ozell SAUNDERS, MD;  Location: [facility];  Service: Urology;  Laterality: Right;   LEFT HEART CATH AND CORONARY ANGIOGRAPHY N/A 12/05/2018   Procedure:  LEFT HEART CATH AND CORONARY ANGIOGRAPHY with possible pci and stent;  Surgeon: [name], MD;  Location: [facility];  Service: Cardiovascular;  Laterality: N/A;   PORTA CATH INSERTION Right 09/25/2021   Procedure: PORTA CATH INSERTION;  Surgeon: [name], MD;  Location: [facility];  Service: Cardiovascular;  Laterality: Right;   VIDEO BRONCHOSCOPY WITH ENDOBRONCHIAL NAVIGATION N/A 08/02/2023   Procedure: VIDEO BRONCHOSCOPY WITH ENDOBRONCHIAL NAVIGATION;  Surgeon: [name], MD;  Location: [facility];  Service: Thoracic;  Laterality: N/A;   VIDEO BRONCHOSCOPY WITH ENDOBRONCHIAL ULTRASOUND N/A 08/02/2023   Procedure: BRONCHOSCOPY, WITH EBUS;  Surgeon: [name], MD;  Location: [facility];  Service: Thoracic;  Laterality: N/A;    Social History   Socioeconomic History   Marital status: Married    Spouse name: Shawnee   Number of children: 2   Years of education: Not on file   Highest education level: 8th grade  Occupational History   Occupation: retired  Tobacco Use   Smoking status: Former    Current packs/day: 0.00    Average packs/day: 1 pack/day for 30.0 years (30.0 ttl pk-yrs)    Types: Cigarettes    Start date: 03/05/1954    Quit date: 03/05/1984    Years since quitting: 39.7   Smokeless tobacco: Current    Types: Chew   Tobacco comments:    Chews tobacco daily.  Vaping Use   Vaping status: Never Used  Substance and Sexual Activity   Alcohol use: No    Alcohol/week: 0.0 standard drinks of alcohol   Drug use: No   Sexual activity: Not Currently  Other Topics Concern   Not on file  Social History Narrative   Lives with Shawnee, wife, and son [place].   Social Drivers of Corporate investment banker Strain: Low Risk  (05/01/2023)   Overall Financial Resource Strain (CARDIA)    Difficulty of Paying Living Expenses: Not hard at all  Food Insecurity: No Food Insecurity (05/01/2023)   Hunger Vital Sign    Worried About Running Out of Food in the  Last Year: Never true    Ran Out of Food in the Last Year: Never true  Transportation Needs: No Transportation Needs (05/01/2023)   PRAPARE - Administrator, Civil Service (Medical): No    Lack of Transportation (Non-Medical): No  Physical Activity: Insufficiently Active (05/01/2023)   Exercise Vital Sign    Days of Exercise per Week: 1 day    Minutes of Exercise per Session: 90 min  Stress: No Stress Concern Present (05/01/2023)   [employer] of Occupational Health - Occupational Stress Questionnaire    Feeling of Stress : Not at all  Social Connections: Moderately Integrated (05/01/2023)   Social Connection and Isolation Panel  Frequency of Communication with Friends and Family: More than three times a week    Frequency of Social Gatherings with Friends and Family: More than three times a week    Attends Religious Services: More than 4 times per year    Active Member of [employer] or Organizations: No    Attends Banker Meetings: Never    Marital Status: Married  Catering manager Violence: Not At Risk (05/01/2023)   Humiliation, Afraid, Rape, and Kick questionnaire    Fear of Current or Ex-Partner: No    Emotionally Abused: No    Physically Abused: No    Sexually Abused: No    Family History  Problem Relation Age of Onset   Pulmonary embolism Mother    Transient ischemic attack Mother    Diabetes Mother    Pancreatic cancer Father    Hypertension Father    Diabetes Father    Cirrhosis Brother      Current Outpatient Medications:    atorvastatin  (LIPITOR) 40 MG tablet, TAKE ONE TABLET BY MOUTH DAILY, Disp: 90 tablet, Rfl: 3   cetirizine  (ZYRTEC ) 10 MG tablet, TAKE ONE HALF (1/2) TABLET BY MOUTH DAILY, Disp: 45 tablet, Rfl: 0   clopidogrel  (PLAVIX ) 75 MG tablet, Take 75 mg by mouth daily., Disp: , Rfl:    co-enzyme Q-10 30 MG capsule, Take 30 mg by mouth daily., Disp: , Rfl:    Cranberry-Vitamin C-Probiotic (AZO CRANBERRY PO), Take 2 tablets by  mouth daily at 12 noon., Disp: , Rfl:    diclofenac  sodium (VOLTAREN ) 1 % GEL, Apply 2 g topically 4 (four) times daily., Disp: 100 g, Rfl: 3   Krill Oil 1000 MG CAPS, Take by mouth., Disp: , Rfl:    metoprolol  tartrate (LOPRESSOR ) 25 MG tablet, Take 0.5 tablets (12.5 mg total) by mouth 2 (two) times daily., Disp: 60 tablet, Rfl: 1   MULTIPLE VITAMIN PO, Take 1 tablet by mouth daily., Disp: , Rfl:    nitroGLYCERIN  (NITROSTAT ) 0.4 MG SL tablet, Place under the tongue., Disp: , Rfl:    OneTouch Delica Lancets 33G MISC, TEST FASTING GLUCOSE LEVEL EACH MORNING BEFORE BREAKFAST, Disp: 100 each, Rfl: 4   ramipril  (ALTACE ) 1.25 MG capsule, Take 1.25 mg by mouth daily., Disp: , Rfl:    celecoxib  (CELEBREX ) 100 MG capsule, Take 100 mg by mouth daily. (Patient not taking: Reported on 10/30/2023), Disp: , Rfl:    fluconazole  (DIFLUCAN ) 100 MG tablet, Take 1 tablet (100 mg total) by mouth daily., Disp: 7 tablet, Rfl: 0   pantoprazole  (PROTONIX ) 40 MG tablet, Take 40 mg by mouth daily., Disp: , Rfl:    potassium chloride  SA (KLOR-CON  M) 20 MEQ tablet, Take 1 tablet (20 mEq total) by mouth daily., Disp: 7 tablet, Rfl: 0   ramipril  (ALTACE ) 5 MG capsule, TAKE ONE CAPSULE (5 MG TOTAL) BY MOUTH TWO (TWO) TIMES DAILY., Disp: 180 capsule, Rfl: 1  Physical exam:  Vitals:   11/22/23 1417 11/22/23 1427  BP: (!) 148/99 (!) 141/84  Pulse: 86 85  Resp: 17   Temp: 97.7 F (36.5 C)   TempSrc: Tympanic   SpO2: 98%   Weight: 170 lb 12.8 oz (77.5 kg)   Height: 5' 9 (1.753 m)    Physical Exam Cardiovascular:     Rate and Rhythm: Normal rate and regular rhythm.     Heart sounds: Normal heart sounds.  Pulmonary:     Effort: Pulmonary effort is normal.     Breath sounds: Normal breath sounds.  Skin:    General: Skin is warm and dry.  Neurological:     Mental Status: He is alert and oriented to person, place, and time.      I have personally reviewed labs listed below:    Latest Ref Rng & Units 11/07/2023    11:24 AM  CMP  Creatinine 0.61 - 1.24 mg/dL 8.89       Latest Ref Rng & Units 09/23/2023    4:13 PM  CBC  WBC 4.0 - 10.5 K/uL 5.2   Hemoglobin 13.0 - 17.0 g/dL 85.7   Hematocrit 60.9 - 52.0 % 42.0   Platelets 150 - 400 K/uL 146    I have personally reviewed Radiology images listed below: No images are attached to the encounter.  CT CHEST ABDOMEN PELVIS W CONTRAST Result Date: 11/15/2023 CLINICAL DATA:  Recurrent non-small cell lung cancer restaging * Tracking Code: BO * EXAM: CT CHEST, ABDOMEN, AND PELVIS WITH CONTRAST TECHNIQUE: Multidetector CT imaging of the chest, abdomen and pelvis was performed following the standard protocol during bolus administration of intravenous contrast. RADIATION DOSE REDUCTION: This exam was performed according to the departmental dose-optimization program which includes automated exposure control, adjustment of the mA and/or kV according to patient size and/or use of iterative reconstruction technique. CONTRAST:  OMNIPAQUE  IOHEXOL  300 MG/ML  SOLN COMPARISON:  PET-CT, 07/19/2023 FINDINGS: CT CHEST FINDINGS Cardiovascular: Right chest port catheter. Aortic atherosclerosis. Normal heart size. Three-vessel coronary artery calcifications. No pericardial effusion. Mediastinum/Nodes: Unchanged treated soft tissue about the right hilum. Unchanged enlarged subcarinal lymph node measuring 2.5 x 1.5 cm (series 2, image 27). Thyroid  gland, trachea, and esophagus demonstrate no significant findings. Lungs/Pleura: Slightly diminished size of a treated mass in the right upper lobe measuring 4.3 x 3.7 cm, previously 4.6 x 4.5 cm when measured similarly (series 2, image 20). Similar appearance of fibrotic scarring and bronchiectasis, particularly in the superior segment right lower lobe. 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 Mild centrilobular and paraseptal emphysema. Trace right pleural effusion, increased compared to prior examination. Musculoskeletal:  No chest wall abnormality. No acute osseous findings. CT ABDOMEN PELVIS FINDINGS Hepatobiliary: No solid liver abnormality is seen. No gallstones, gallbladder wall thickening, or biliary dilatation. Pancreas: Unremarkable. No pancreatic ductal dilatation or surrounding inflammatory changes. Spleen: Normal in size without significant abnormality. Adrenals/Urinary Tract: Adrenal glands are unremarkable. Kidneys are normal, without renal calculi, solid lesion, or hydronephrosis. Bladder is unremarkable. Stomach/Bowel: Stomach is within normal limits. Appendix appears normal. No evidence of bowel wall thickening, distention, or inflammatory changes. Vascular/Lymphatic: Aortic atherosclerosis. No enlarged abdominal or pelvic lymph nodes. Reproductive: Prostatomegaly.  Urolift implants. Other: Small fat containing bilateral inguinal hernias. Small fat containing umbilical hernia. No ascites. Musculoskeletal: No acute osseous findings. IMPRESSION: 1. Slightly diminished size of a treated mass in the right upper lobe. 2. Unchanged treated soft tissue about the right hilum. 3. Unchanged enlarged subcarinal lymph node. 4. No evidence of lymphadenopathy or metastatic disease in the abdomen or pelvis. 5. Trace right pleural effusion, increased compared to prior examination. 6. Emphysema. 7. Coronary artery disease. Aortic Atherosclerosis (ICD10-I70.0) and Emphysema (ICD10-J43.9). Electronically Signed   By: Marolyn JONETTA Jaksch M.D.   On: 11/15/2023 21:52     Assessment and plan- Patient is a 78 y.o. male here for a routine surveillance visit for lung cancer  Patient had concurrent chemoradiation for stage II lung cancer back in October 2023.  He had PET avid recurrent disease in the right upper lobe in June 2025  for which he underwent SBRT for 10 fractions.I have reviewed CT chest abdomen pelvis images from 11/07/2023 independently and discussed findings with the patient which does not show any evidence of recurrent or  progressive disease presently.  There has been a decrease in the size of his treated right upper lobe lung mass.  We will have radiation oncology cancel his CT scan for November and I will plan to get a repeat CT sometime in 4 months from now and see him thereafter   Visit Diagnosis 1. Encounter for follow-up surveillance of lung cancer   2. Malignant neoplasm of upper lobe of right lung [facility])      Dr. Annah Skene, MD, MPH [facility] [phone] 11/23/2023 12:59 PM

## 2023-11-24 IMAGING — CT CT HEAD W/O CM
4 series · 16 of 47 positions shown, 18 images · non-contrast
Comparison: CT cervical spine and CTA neck today reported
separately.

CLINICAL DATA: 75-year-old male with new onset neck stiffness and
pain with decreased range of motion. No known injury.



[Series 4: head wo · axial · 0.48mm/px · z∈[-414,-288]mm · 7 of 35 slices shown, 9 images]
[im 5/35  brain]
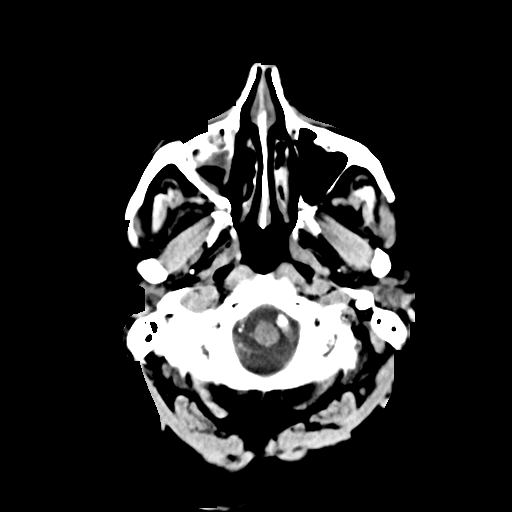
[im 5/35  bone]
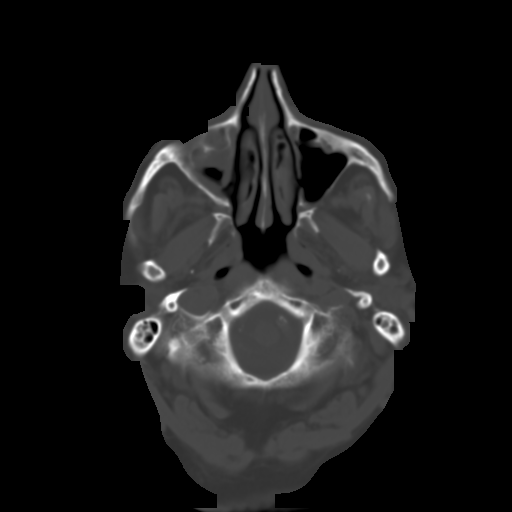
[im 9/35  brain]
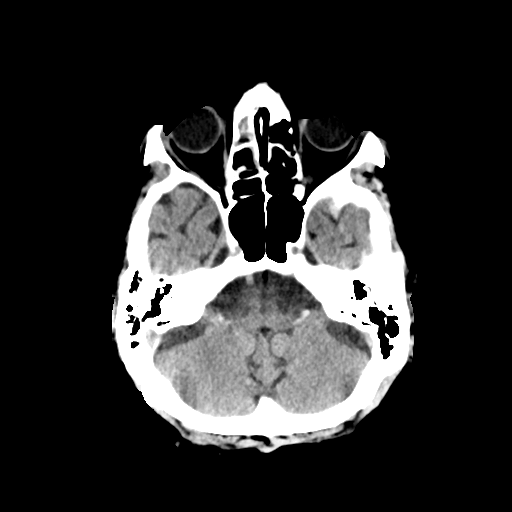
[im 13/35  brain]
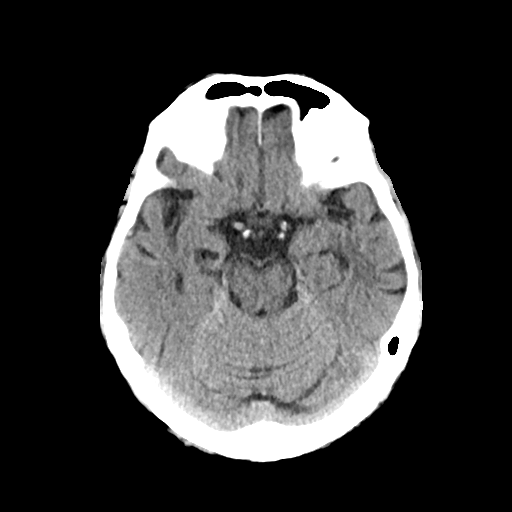
[im 18/35  brain]
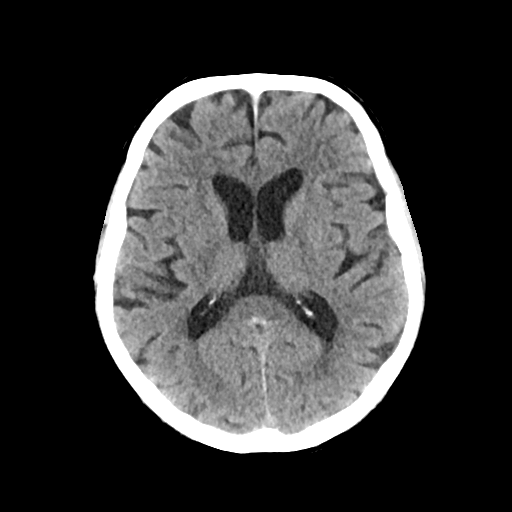
[im 22/35  brain]
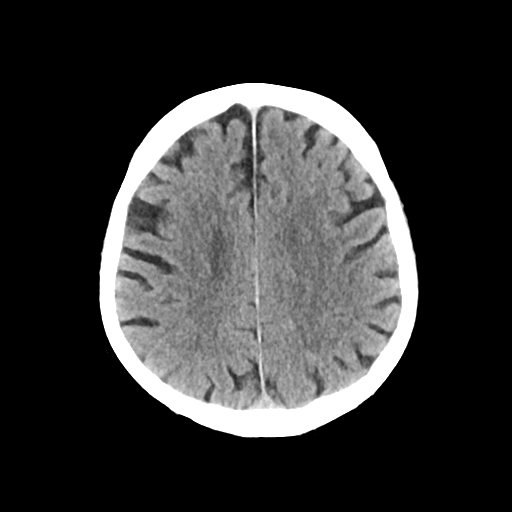
[im 22/35  bone]
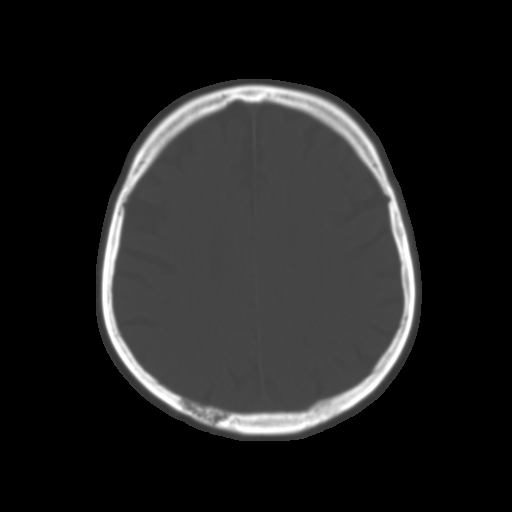
[im 26/35  brain]
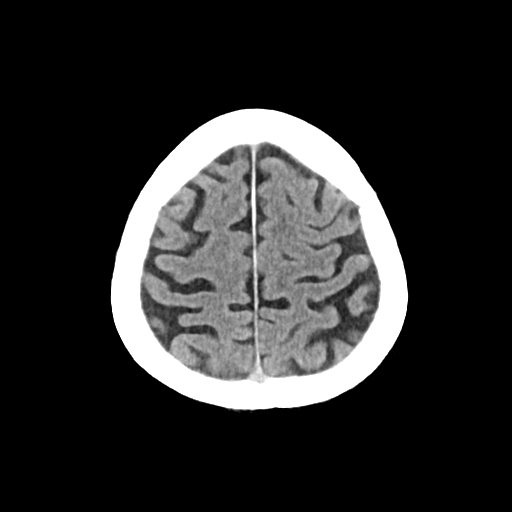
[im 30/35  brain]
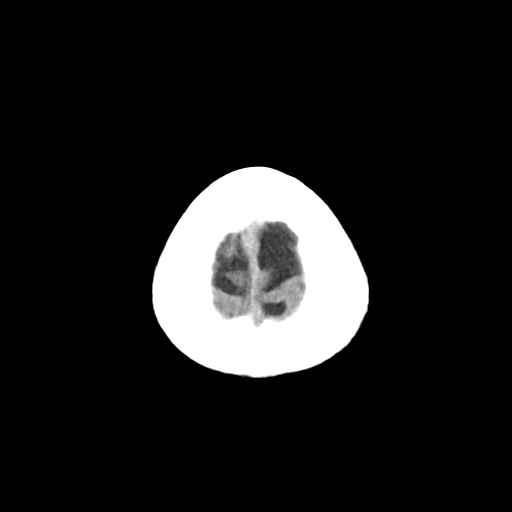

[Series 5: head bone · axial · 0.48mm/px · z∈[-418,-384]mm · 3 of 87 slices shown]
[im 9/87  bone]
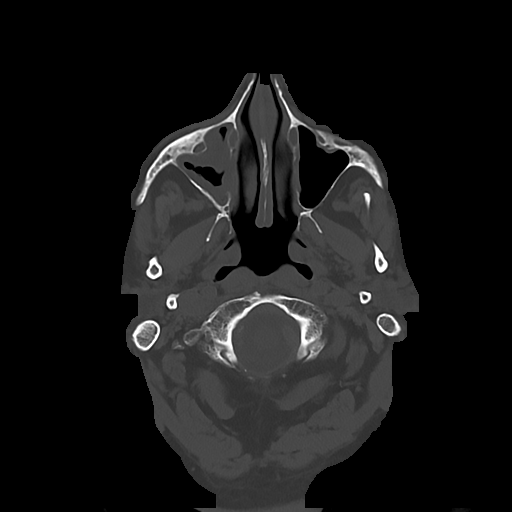
[im 18/87  bone]
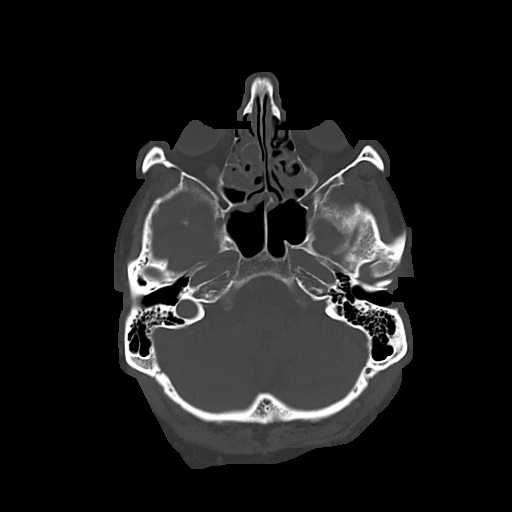
[im 26/87  bone]
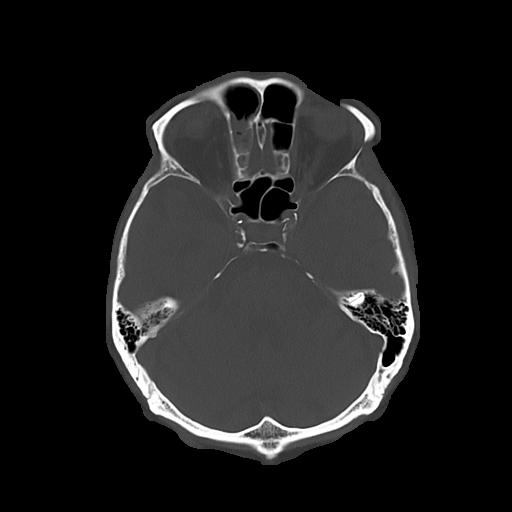

[Series 6: cor soft · coronal · 0.38mm/px · 3 of 76 slices shown]
[im 26/76  brain]
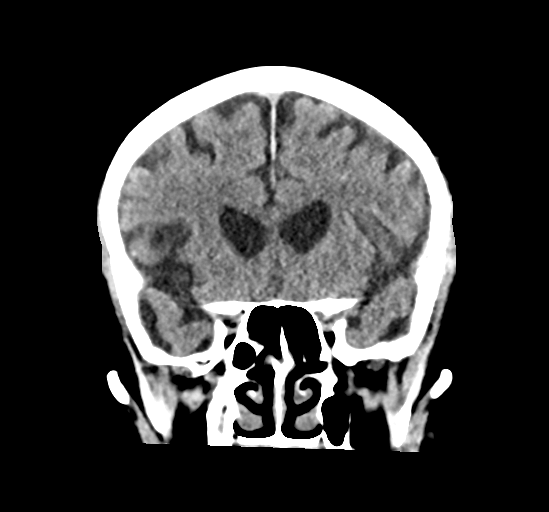
[im 34/76  brain]
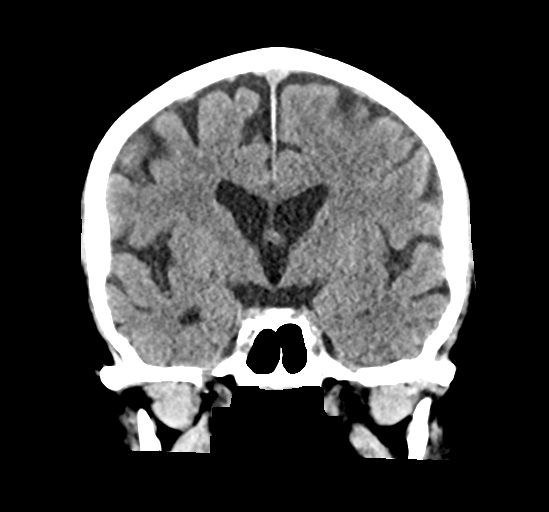
[im 42/76  brain]
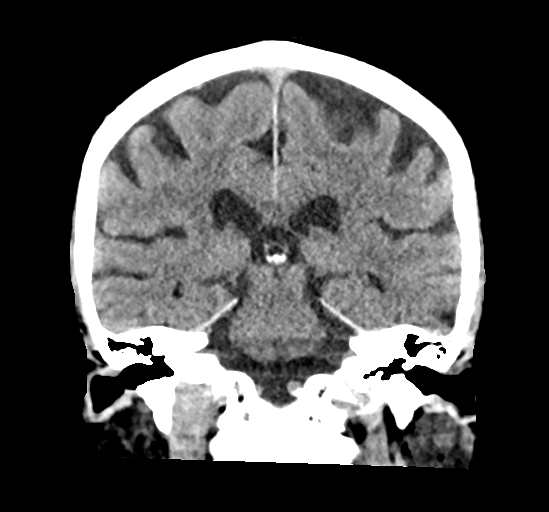

[Series 7: sag soft · sagittal · 0.36mm/px · 3 of 64 slices shown]
[im 22/64  brain]
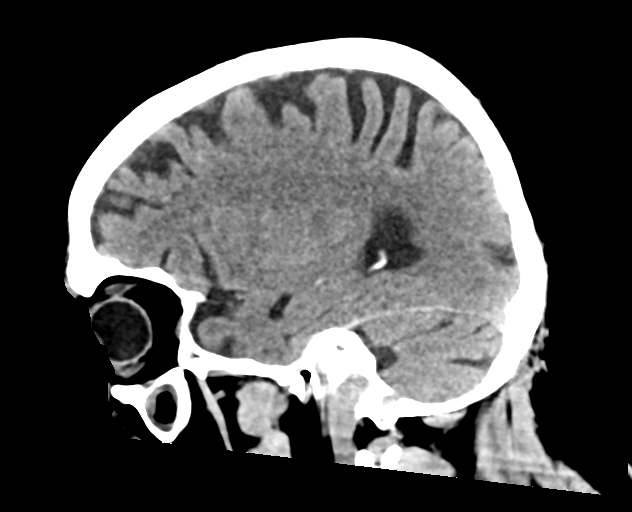
[im 32/64  brain]
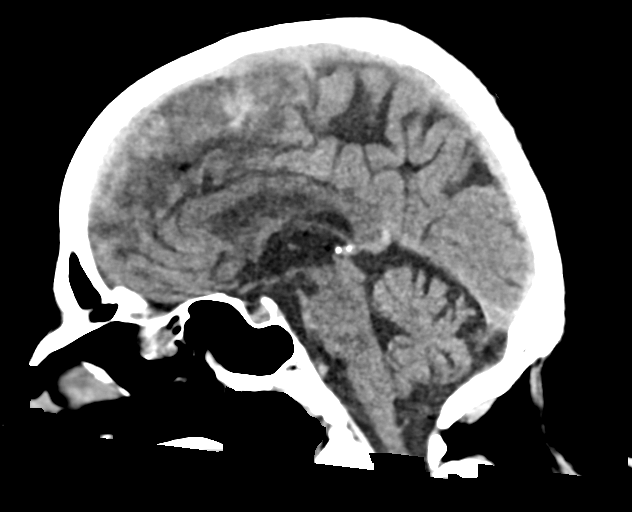
[im 43/64  brain]
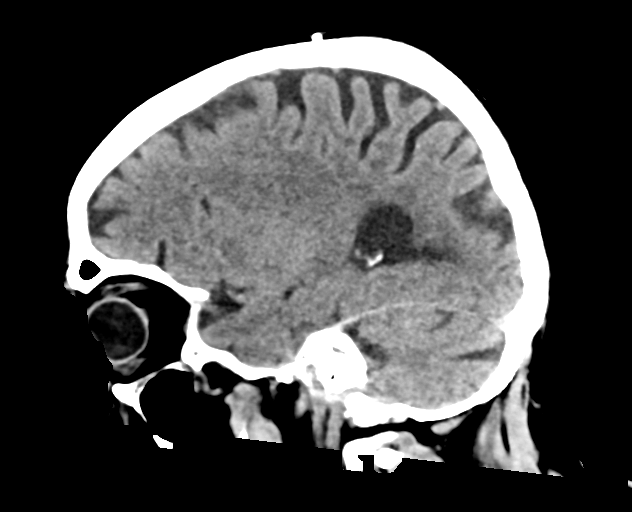

[16 of 47 positions shown; findings below may reference images not displayed]

FINDINGS: Brain: There are few punctate calcifications scattered in the right
hemisphere subarachnoid spaces (such as series 4, images 23 and 24).
These could be postinflammatory or related to distal vessel
atherosclerosis.

No midline shift, ventriculomegaly, mass effect, evidence of mass
lesion, intracranial hemorrhage or evidence of cortically based
acute infarction. Gray-white matter differentiation is within normal
limits for age and no cortical encephalomalacia is identified.

Vascular: Extensive Calcified atherosclerosis at the skull base. No
suspicious intracranial vascular hyperdensity.

Skull: No acute osseous abnormality identified.

Sinuses/Orbits: Moderately opacified bilateral ethmoid air cells,
right frontoethmoidal recess, right maxillary sinus. No sinus fluid
level, but there is mild bubbly opacity. Tympanic cavities and
mastoids appear clear.

Other: Postoperative changes to both globes, otherwise negative
visible orbit and scalp soft tissues.
IMPRESSION: 1. No acute intracranial abnormality.
Largely normal for age non contrast CT appearance of the Brain;
advanced calcified atherosclerosis which might explain occasional
punctate calcifications in the right hemisphere, versus post
infectious sequelae.
2. Moderate bilateral paranasal sinus inflammation.

## 2023-11-24 IMAGING — MR MR CERVICAL SPINE W/O CM
5 series · 35 of 48 positions shown · non-contrast
Comparison: Cervical spine CT, CTA neck, CTA chest today reported
separately.

CLINICAL DATA: 75-year-old male with new onset neck stiffness and
pain with decreased range of motion. No known injury. Advanced
cervical spine degeneration and evidence of spinal stenosis on
cervical spine CT today.

EXAM:
MRI CERVICAL SPINE WITHOUT CONTRAST
TECHNIQUE: Multiplanar, multisequence MR imaging of the cervical spine was
performed. No intravenous contrast was administered.

[Series 9: T2 · sagittal · 3.0mm · 0.62mm/px · 6 of 17 slices shown (1 of 2)]
[im 1/17]
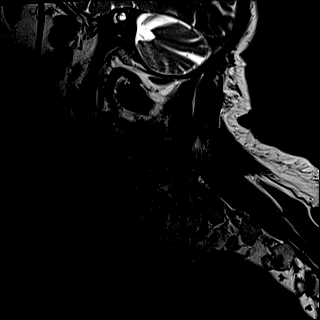
[im 4/17]
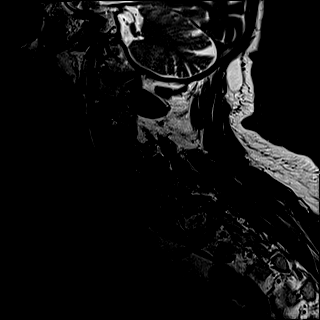
[im 7/17]
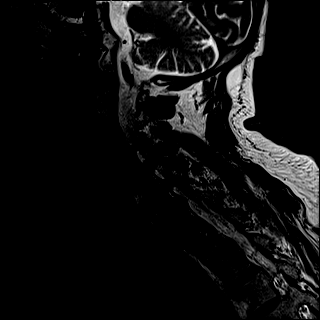
[im 10/17]
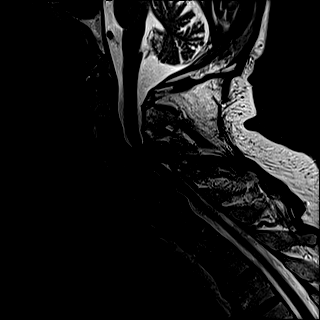
[im 13/17]
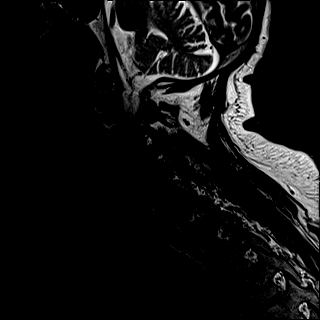
[im 17/17]
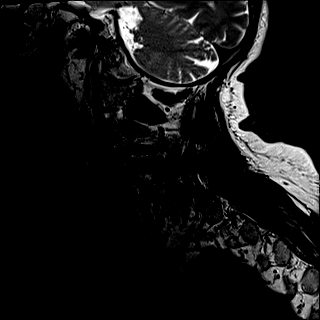

[Series 10: FLAIR · sagittal · 3.0mm · 0.78mm/px · 6 of 17 slices shown]
[im 1/17]
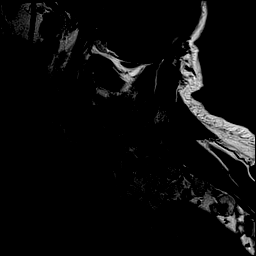
[im 4/17]
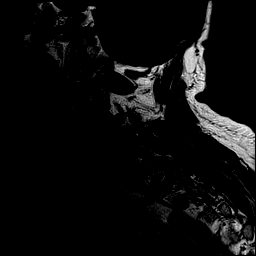
[im 7/17]
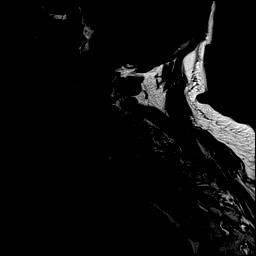
[im 10/17]
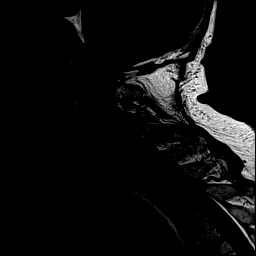
[im 13/17]
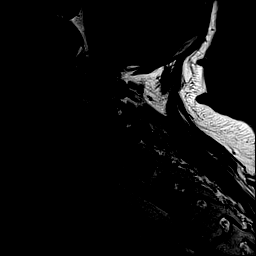
[im 17/17]
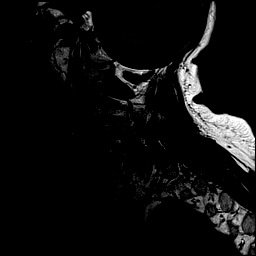

[Series 11: STIR · sagittal · 3.0mm · 0.62mm/px · 6 of 17 slices shown]
[im 1/17]
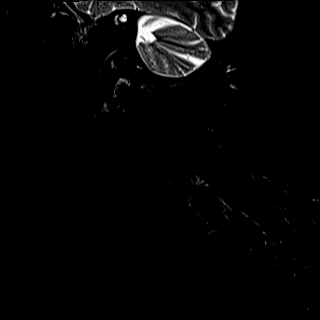
[im 4/17]
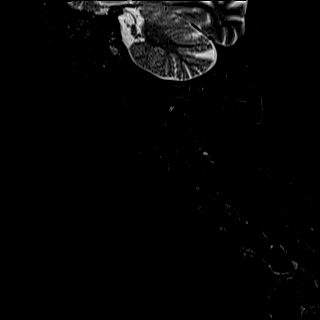
[im 7/17]
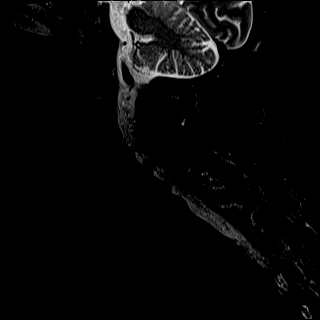
[im 10/17]
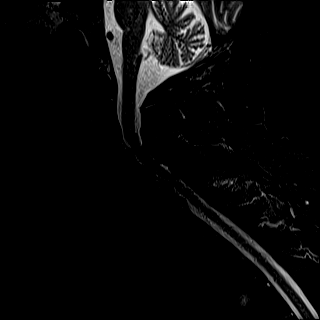
[im 13/17]
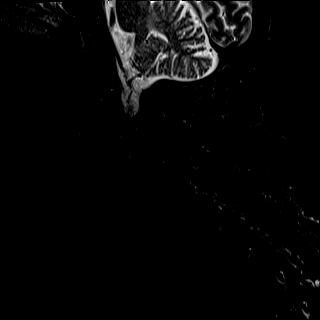
[im 17/17]
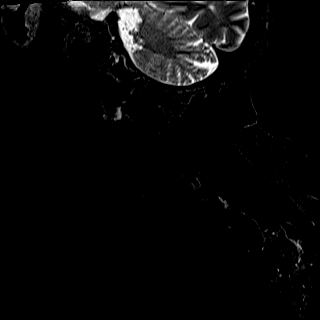

[Series 12: T2 · axial · 3.0mm · 0.70mm/px · z∈[-158,-14]mm · 9 of 45 slices shown (2 of 2)]
[im 1/45]
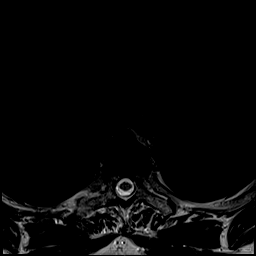
[im 7/45]
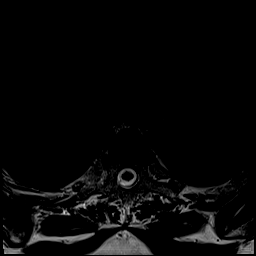
[im 13/45]
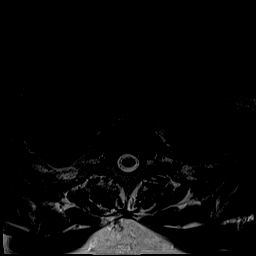
[im 19/45]
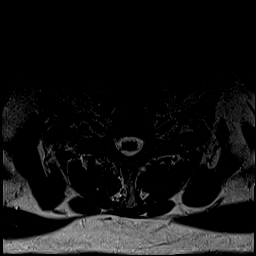
[im 23/45]
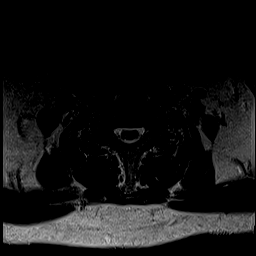
[im 26/45]
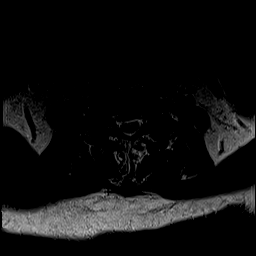
[im 32/45]
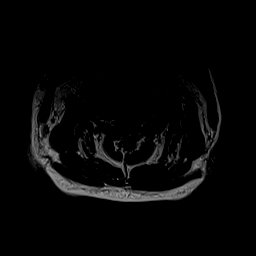
[im 38/45]
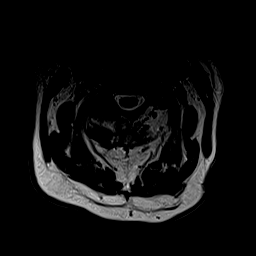
[im 45/45]
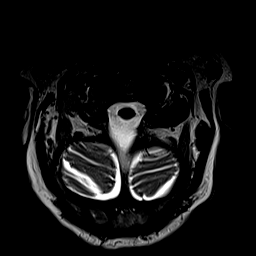

[Series 13: ax mpgr · axial · 3.0mm · 0.35mm/px · z∈[-158,-14]mm · 8 of 45 slices shown]
[im 1/45]
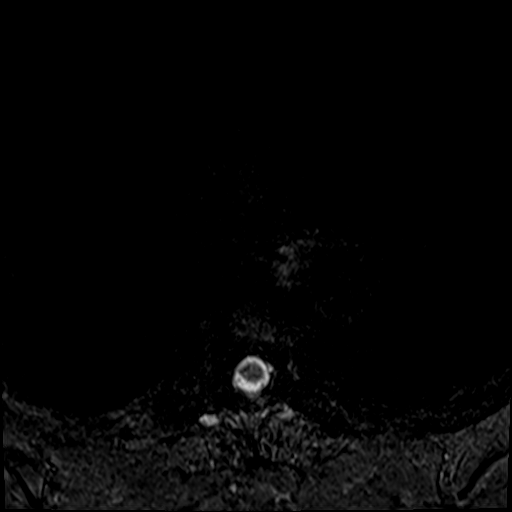
[im 7/45]
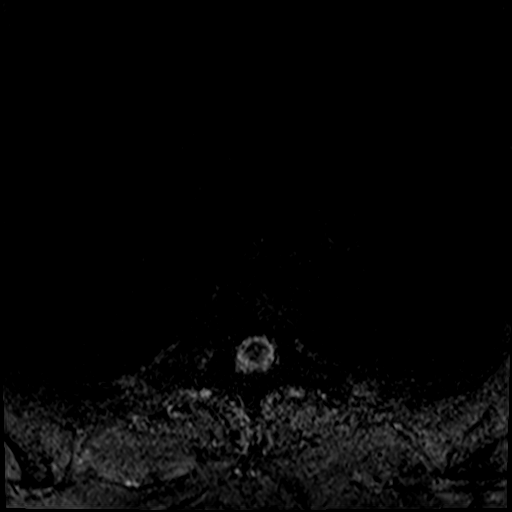
[im 13/45]
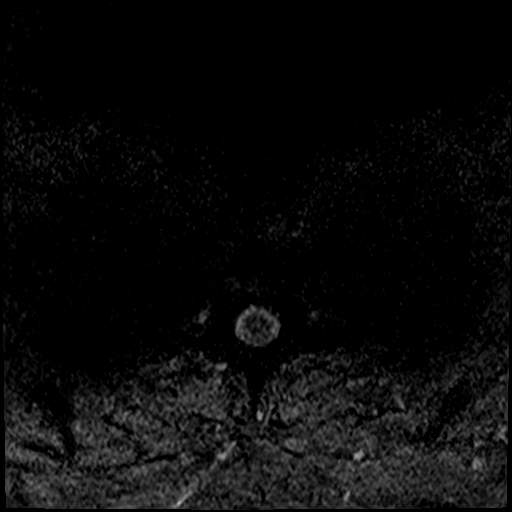
[im 19/45]
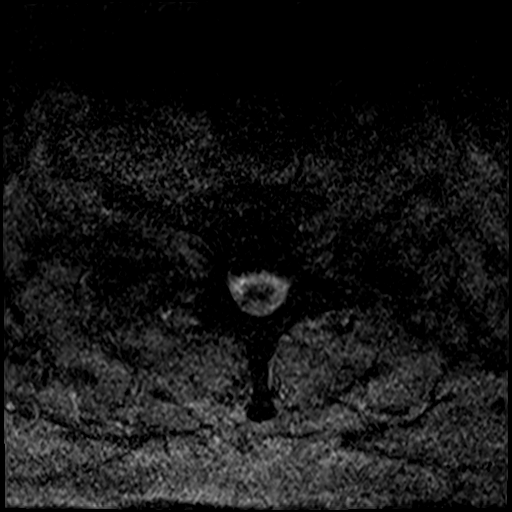
[im 26/45]
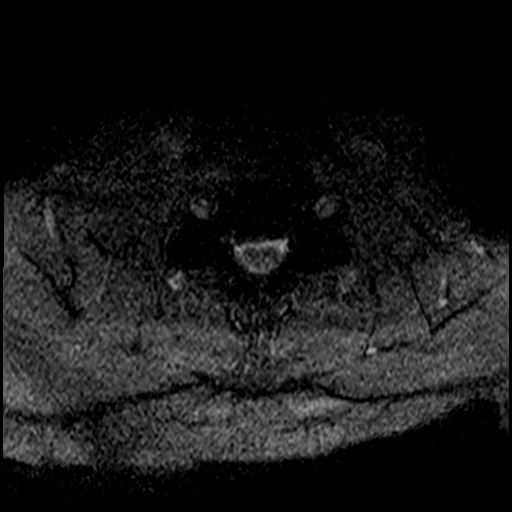
[im 32/45]
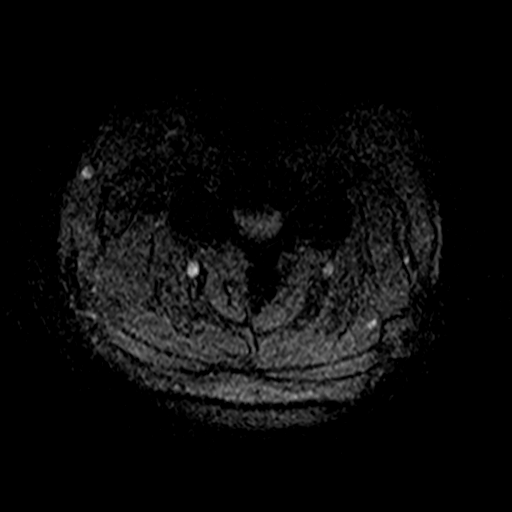
[im 38/45]
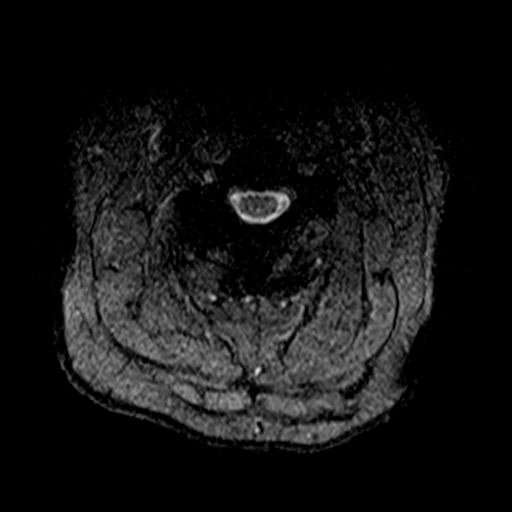
[im 45/45]
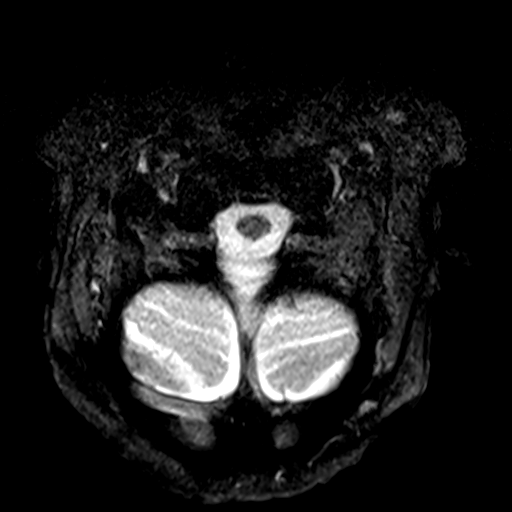

[35 of 48 positions shown; findings below may reference images not displayed]

FINDINGS: Alignment: Stable to the earlier CT, degenerative appearing
spondylolisthesis at C3-C4 and C4-C5.

Vertebrae: Normal background bone marrow signal. Degenerative
endplate marrow signal changes at C5 and C6. However, T3 vertebral
body patchy anterior decreased T1 signal and STIR hyperintense
lesion which demonstrates patchy sclerosis on CT today. This is
indeterminate for bone metastasis in light of the right lung
findings.

No other marrow edema or evidence of acute osseous abnormality.

Cord: No spinal cord signal abnormality despite multilevel
degenerative cord mass effect detailed below. Negative visible upper
thoracic spinal canal and cord.

Posterior Fossa, vertebral arteries, paraspinal tissues:
Cervicomedullary junction is within normal limits. Preserved major
vascular flow voids in the neck, the left vertebral artery appears
dominant. Negative visible neck soft tissues. Chest reported
separately today.

Disc levels:

C2-C3: Moderate facet hypertrophy on the right. Minor disc bulging.
Foraminal endplate spurring mostly on the right. Moderate to severe
right C3 foraminal stenosis.

C3-C4: Moderate degenerative spinal stenosis and spinal cord mass
effect (series 9, image 9 and series 12, image 11). Anterolisthesis
with bulky broad-based posterior disc, also facet and ligament
flavum hypertrophy. Severe bilateral C4 foraminal stenosis.

C4-C5: Mild to moderate multifactorial spinal stenosis and spinal
cord mass effect (series 9, image 9 and series 12, image 14).
Anterolisthesis with bulky posterior disc and also facet and
ligament flavum hypertrophy. Severe bilateral C5 foraminal stenosis.

C5-C6: Severe disc space loss. Circumferential disc osteophyte
complex eccentric to the right. Mild to moderate facet hypertrophy.
Mild spinal stenosis and ventral cord mass effect. Severe bilateral
C6 foraminal stenosis.

C6-C7: Similar danced disc space loss with bulky circumferential
disc osteophyte complex. Broad-based posterior component. Borderline
to mild spinal stenosis. No cord mass effect. Moderate to severe
bilateral C7 foraminal stenosis.

C7-T1:  Moderate facet hypertrophy but no stenosis.
IMPRESSION: 1. Nonspecific 10 mm T3 vertebral body lesion is indeterminate for
bone metastasis in light of the right lung findings today. Recommend
attention on PET-CT. No evidence for cervical spine metastasis on
this noncontrast exam.

2. Advanced cervical spine degeneration with up to Moderate spinal
stenosis AND spinal cord mass effect C3-C4 and C4-C5. Mild spinal
stenosis at C5-C6 and C6-C7. But no associated spinal cord signal
abnormality.

3. Associated severe neural foraminal stenosis at the right C3,
bilateral C4, C5, C6 and C7 nerve levels.

## 2023-11-25 ENCOUNTER — Ambulatory Visit: Payer: Self-pay

## 2023-11-25 ENCOUNTER — Telehealth: Payer: Self-pay | Admitting: *Deleted

## 2023-11-25 ENCOUNTER — Encounter: Payer: Self-pay | Admitting: Oncology

## 2023-11-25 NOTE — Telephone Encounter (Signed)
 The wife called and said she wanted to know that I will go with the cancer.  Around says she did not see any cancer there however she has had the cancer he has had the cancer once over and we have to keep coming to the appointments getting the scans .

## 2023-11-25 NOTE — Telephone Encounter (Signed)
 FYI Only or Action Required?: FYI only for provider.  Patient was last seen in primary care on 08/05/2023 by Bernardo Fend, DO.  Called Nurse Triage reporting Blood Sugar Problem.  Symptoms began today.  Interventions attempted: Rest, hydration, or home remedies.  Symptoms are: unchanged.  Triage Disposition: See Physician Within 24 Hours (overriding Home Care)  Patient/caregiver understands and will follow disposition?: Yes   Copied from CRM [id number]. Topic: Clinical - Red Word Triage >> Nov 25, 2023  4:46 PM Shereese L wrote: [facility] that prompted transfer to Nurse Triage: blood Sugar 208 Reason for Disposition  Blood glucose 70-240 mg/dL (3.9 -86.6 mmol/L)  Answer Assessment - Initial Assessment Questions Additional info: Requesting appointment with pcp for blood glucose trending up. Not on mediations at this time. Scheduled next available acute visit with pcp 11/26/23   1. BLOOD GLUCOSE: What is your blood glucose level?      208 2. ONSET: When did you check the blood glucose?     Today few minutes ago  3. USUAL RANGE: What is your glucose level usually? (e.g., usual fasting morning value, usual evening value)     118 4. KETONES: Do you check for ketones (urine or blood test strips)? If Yes, ask: What does the test show now?      no 5. TYPE 1 or 2:  Do you know what type of diabetes you have?  (e.g., Type 1, Type 2, Gestational; doesn't know)      unsure 6. INSULIN : Do you take insulin ? What type of insulin (s) do you use? What is the mode of delivery? (syringe, pen; injection or pump)?      none 7. DIABETES PILLS: Do you take any pills for your diabetes? If Yes, ask: Have you missed taking any pills recently?     none 8. OTHER SYMPTOMS: Do you have any symptoms? (e.g., fever, frequent urination, difficulty breathing, dizziness, weakness, vomiting)     Chronic sob-evaluated last week at oncology-using nebs-breathing no worse than  usual.  Protocols used: Diabetes - High Blood Sugar-A-AH

## 2023-11-26 ENCOUNTER — Encounter: Payer: Self-pay | Admitting: Family Medicine

## 2023-11-26 ENCOUNTER — Ambulatory Visit (INDEPENDENT_AMBULATORY_CARE_PROVIDER_SITE_OTHER): Admitting: Family Medicine

## 2023-11-26 VITALS — BP 117/78 | HR 96 | Resp 16 | Ht 69.0 in | Wt 168.5 lb

## 2023-11-26 DIAGNOSIS — E1169 Type 2 diabetes mellitus with other specified complication: Secondary | ICD-10-CM | POA: Diagnosis not present

## 2023-11-26 DIAGNOSIS — Z23 Encounter for immunization: Secondary | ICD-10-CM

## 2023-11-26 DIAGNOSIS — C801 Malignant (primary) neoplasm, unspecified: Secondary | ICD-10-CM | POA: Diagnosis not present

## 2023-11-26 DIAGNOSIS — C7801 Secondary malignant neoplasm of right lung: Secondary | ICD-10-CM

## 2023-11-26 DIAGNOSIS — R739 Hyperglycemia, unspecified: Secondary | ICD-10-CM

## 2023-11-26 LAB — POCT GLYCOSYLATED HEMOGLOBIN (HGB A1C): Hemoglobin A1C: 6 % — AB (ref 4.0–5.6)

## 2023-11-26 NOTE — Telephone Encounter (Signed)
 Noted

## 2023-11-26 NOTE — Progress Notes (Signed)
 Established patient visit   Patient: Luke Rojas   DOB: 29-Jun-1945   78 y.o. Male  MRN: [id number] Visit Date: 11/26/2023  Today's healthcare provider: Jon Eva, MD   Chief Complaint  Patient presents with   Diabetes    3 cortisone shot in back sugar levels went up to 200's. But states of drinking a lot of water   Subjective    Diabetes   HPI     Diabetes    Additional comments: 3 cortisone shot in back sugar levels went up to 200's. But states of drinking a lot of water      Last edited by Wilfred Hargis RAMAN, CMA on 11/26/2023 10:53 AM.       Discussed the use of AI scribe software for clinical note transcription with the patient, who gave verbal consent to proceed.  History of Present Illness   [name] is a 78 year old male with diabetes who presents with elevated blood sugar levels following cortisone injections.  Blood sugar levels have risen to over 200 mg/dL following cortisone injections in his back. Previously, his blood sugar was well-controlled at around 118 mg/dL without medication. He has been hydrating to manage his blood sugar, which decreased to 163 mg/dL but rose again to 791 mg/dL. He is not currently on metformin  or any diabetes medication.  He has a history of lung cancer with recurrence treated with radiation. He experiences persistent fatigue and shortness of breath, especially when talking. He uses a nebulizer one to two times daily due to insurance issues with a new medication. He sometimes uses a walker due to leg weakness.  Cortisone shots have alleviated back and leg pain, slightly improving mobility, though he still has difficulty walking. He has resumed blood thinners after a brief discontinuation and bruises easily. He experiences cold feet and uses a cream for redness, which has improved. He lives with his wife and has been staying home more often.         Medications: Outpatient Medications Prior to  Visit  Medication Sig   atorvastatin  (LIPITOR) 40 MG tablet TAKE ONE TABLET BY MOUTH DAILY   celecoxib  (CELEBREX ) 100 MG capsule Take 100 mg by mouth daily.   cetirizine  (ZYRTEC ) 10 MG tablet TAKE ONE HALF (1/2) TABLET BY MOUTH DAILY   clopidogrel  (PLAVIX ) 75 MG tablet Take 75 mg by mouth daily.   co-enzyme Q-10 30 MG capsule Take 30 mg by mouth daily.   Cranberry-Vitamin C-Probiotic (AZO CRANBERRY PO) Take 2 tablets by mouth daily at 12 noon.   diclofenac  sodium (VOLTAREN ) 1 % GEL Apply 2 g topically 4 (four) times daily.   fluconazole  (DIFLUCAN ) 100 MG tablet Take 1 tablet (100 mg total) by mouth daily.   Krill Oil 1000 MG CAPS Take by mouth.   metoprolol  tartrate (LOPRESSOR ) 25 MG tablet Take 0.5 tablets (12.5 mg total) by mouth 2 (two) times daily.   MULTIPLE VITAMIN PO Take 1 tablet by mouth daily.   nitroGLYCERIN  (NITROSTAT ) 0.4 MG SL tablet Place under the tongue.   OneTouch Delica Lancets 33G MISC TEST FASTING GLUCOSE LEVEL EACH MORNING BEFORE BREAKFAST   pantoprazole  (PROTONIX ) 40 MG tablet Take 40 mg by mouth daily.   potassium chloride  SA (KLOR-CON  M) 20 MEQ tablet Take 1 tablet (20 mEq total) by mouth daily.   ramipril  (ALTACE ) 1.25 MG capsule Take 1.25 mg by mouth daily.   ramipril  (ALTACE ) 5 MG capsule TAKE ONE CAPSULE (5 MG TOTAL)  BY MOUTH TWO (TWO) TIMES DAILY.   No facility-administered medications prior to visit.    Review of Systems     Objective    BP 117/78   Pulse 96   Resp 16   Ht 5' 9 (1.753 m)   Wt 168 lb 8 oz (76.4 kg)   SpO2 100%   BMI 24.88 kg/m    Physical Exam Vitals reviewed.  Constitutional:      General: He is not in acute distress.    Appearance: Normal appearance. He is not diaphoretic.  HENT:     Head: Normocephalic and atraumatic.  Eyes:     General: No scleral icterus.    Conjunctiva/sclera: Conjunctivae normal.  Cardiovascular:     Rate and Rhythm: Normal rate and regular rhythm.     Heart sounds: Normal heart sounds. No murmur  heard. Pulmonary:     Effort: Pulmonary effort is normal. No respiratory distress.     Breath sounds: Normal breath sounds. No wheezing or rhonchi.  Musculoskeletal:     Cervical back: Neck supple.     Right lower leg: No edema.     Left lower leg: No edema.  Lymphadenopathy:     Cervical: No cervical adenopathy.  Skin:    General: Skin is warm and dry.     Findings: No rash.  Neurological:     Mental Status: He is alert and oriented to person, place, and time. Mental status is at baseline.  Psychiatric:        Mood and Affect: Mood normal.        Behavior: Behavior normal.      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 Previously well-controlled without medication, typically around 118 mg/dL. Temporary elevation expected to resolve without intervention. - Check A1c today via finger stick - 6.0 well controlled - Perform annual urine test for diabetes - Encourage hydration with water - Monitor blood sugar levels and report if he remains elevated      Relevant Orders   Urine Microalbumin w/creat. ratio   POCT HgB A1C (Completed)   Other Visit Diagnoses       Hyperglycemia                Malignant neoplasm of right lung, in remission Lung cancer previously treated with chemotherapy and radiation. Recent recurrence treated with stronger radiation only, resulting in remission. Reports ongoing fatigue and shortness of breath since treatment. No current evidence of cancer.  Chronic shortness of breath Persistent since recent radiation treatment for lung cancer. Lung sounds are clear, but experiences breathlessness with exertion and talking.  Chronic back and leg pain  Improved following recent corticosteroid injections. Reports improved mobility but still requires occasional use of a walker. Relief from injections is temporary.  General Health Maintenance Flu vaccination for the current season is due. Previous vaccination was for the last season. - Administer high-dose flu shot       Return in about 6 months (around 05/25/2024) for CPE.       Jon Eva, MD  [facility] [phone] (phone) [phone] (fax)  [facility]

## 2023-11-26 NOTE — Assessment & Plan Note (Signed)
 Blood sugar levels elevated to over 200 mg/dL following recent corticosteroid injections. Previously well-controlled without medication, typically around 118 mg/dL. Temporary elevation expected to resolve without intervention. - Check A1c today via finger stick - 6.0 well controlled - Perform annual urine test for diabetes - Encourage hydration with water - Monitor blood sugar levels and report if he remains elevated

## 2023-11-27 LAB — MICROALBUMIN / CREATININE URINE RATIO
Creatinine, Urine: 91.1 mg/dL
Microalb/Creat Ratio: 45 mg/g{creat} — ABNORMAL HIGH (ref 0–29)
Microalbumin, Urine: 40.6 ug/mL

## 2023-11-28 ENCOUNTER — Ambulatory Visit: Payer: Self-pay | Admitting: Family Medicine

## 2023-12-06 ENCOUNTER — Other Ambulatory Visit: Payer: Self-pay | Admitting: Internal Medicine

## 2023-12-06 DIAGNOSIS — R2689 Other abnormalities of gait and mobility: Secondary | ICD-10-CM

## 2023-12-06 DIAGNOSIS — I152 Hypertension secondary to endocrine disorders: Secondary | ICD-10-CM

## 2023-12-06 DIAGNOSIS — I951 Orthostatic hypotension: Secondary | ICD-10-CM

## 2023-12-09 NOTE — Telephone Encounter (Signed)
 Requested Prescriptions  Pending Prescriptions Disp Refills   metoprolol  tartrate (LOPRESSOR ) 25 MG tablet [Pharmacy Med Name: METOPROLOL  TARTRATE 25MG  TABLET] 90 tablet 0    Sig: TAKE HALF A TABLET (12.5 MG TOTAL) BY MOUTH TWO (TWO) TIMES DAILY.     Cardiovascular:  Beta Blockers Passed - 12/09/2023  9:28 AM      Passed - Last BP in normal range    BP Readings from Last 1 Encounters:  11/26/23 117/78         Passed - Last Heart Rate in normal range    Pulse Readings from Last 1 Encounters:  11/26/23 96         Passed - Valid encounter within last 6 months    Recent Outpatient Visits           1 week ago Type 2 diabetes mellitus with other specified complication, without long-term current use of insulin  [facility])   [facility], Jon HERO, MD   4 months ago Hearing loss of both ears due to cerumen impaction   [facility] Bernardo Fend, DO   6 months ago Orthostatic hypotension   [facility] Bernardo Fend, DO   6 months ago Orthostatic hypotension   [facility] Bernardo Fend, DO   7 months ago Encounter for annual physical exam   [facility], Jon HERO, MD

## 2023-12-12 DIAGNOSIS — M7918 Myalgia, other site: Secondary | ICD-10-CM | POA: Diagnosis not present

## 2023-12-12 DIAGNOSIS — M5416 Radiculopathy, lumbar region: Secondary | ICD-10-CM | POA: Diagnosis not present

## 2023-12-25 DIAGNOSIS — J449 Chronic obstructive pulmonary disease, unspecified: Secondary | ICD-10-CM | POA: Diagnosis not present

## 2023-12-25 DIAGNOSIS — J479 Bronchiectasis, uncomplicated: Secondary | ICD-10-CM | POA: Diagnosis not present

## 2023-12-25 DIAGNOSIS — Z87891 Personal history of nicotine dependence: Secondary | ICD-10-CM | POA: Diagnosis not present

## 2023-12-25 DIAGNOSIS — C3490 Malignant neoplasm of unspecified part of unspecified bronchus or lung: Secondary | ICD-10-CM | POA: Diagnosis not present

## 2024-01-13 ENCOUNTER — Other Ambulatory Visit

## 2024-01-15 ENCOUNTER — Other Ambulatory Visit (INDEPENDENT_AMBULATORY_CARE_PROVIDER_SITE_OTHER): Payer: Self-pay | Admitting: Nurse Practitioner

## 2024-01-15 DIAGNOSIS — I6521 Occlusion and stenosis of right carotid artery: Secondary | ICD-10-CM

## 2024-01-17 ENCOUNTER — Encounter (INDEPENDENT_AMBULATORY_CARE_PROVIDER_SITE_OTHER): Payer: Self-pay | Admitting: Vascular Surgery

## 2024-01-17 ENCOUNTER — Other Ambulatory Visit (INDEPENDENT_AMBULATORY_CARE_PROVIDER_SITE_OTHER): Payer: Medicare Other

## 2024-01-17 ENCOUNTER — Ambulatory Visit (INDEPENDENT_AMBULATORY_CARE_PROVIDER_SITE_OTHER): Payer: Medicare Other | Admitting: Vascular Surgery

## 2024-01-17 VITALS — BP 148/83 | HR 81 | Resp 16 | Wt 170.0 lb

## 2024-01-17 DIAGNOSIS — E1169 Type 2 diabetes mellitus with other specified complication: Secondary | ICD-10-CM

## 2024-01-17 DIAGNOSIS — E1159 Type 2 diabetes mellitus with other circulatory complications: Secondary | ICD-10-CM | POA: Diagnosis not present

## 2024-01-17 DIAGNOSIS — I714 Abdominal aortic aneurysm, without rupture, unspecified: Secondary | ICD-10-CM

## 2024-01-17 DIAGNOSIS — I6521 Occlusion and stenosis of right carotid artery: Secondary | ICD-10-CM

## 2024-01-17 DIAGNOSIS — E785 Hyperlipidemia, unspecified: Secondary | ICD-10-CM

## 2024-01-17 DIAGNOSIS — I152 Hypertension secondary to endocrine disorders: Secondary | ICD-10-CM

## 2024-01-17 NOTE — Progress Notes (Signed)
 MRN : [id number]  Luke Rojas is a 78 y.o. (Aug 29, 1945) male who presents with chief complaint of  Chief Complaint  Patient presents with   Follow-up    1 yr carotid follow up  .  History of Present Illness: Patient returns in follow-up of his carotid disease.  He is about 2-1/2 years status post right carotid stent placement.  He is doing well.  He denies any focal neurologic symptoms. Specifically, the patient denies amaurosis fugax, speech or swallowing difficulties, or arm or leg weakness or numbness.  Carotid duplex today shows a widely patent right carotid stent and stable 1 to 39% left ICA stenosis.  Current Outpatient Medications  Medication Sig Dispense Refill   atorvastatin  (LIPITOR) 40 MG tablet TAKE ONE TABLET BY MOUTH DAILY 90 tablet 3   celecoxib  (CELEBREX ) 100 MG capsule Take 100 mg by mouth daily.     cetirizine  (ZYRTEC ) 10 MG tablet TAKE ONE HALF (1/2) TABLET BY MOUTH DAILY 45 tablet 0   clopidogrel  (PLAVIX ) 75 MG tablet Take 75 mg by mouth daily.     co-enzyme Q-10 30 MG capsule Take 30 mg by mouth daily.     Cranberry-Vitamin C-Probiotic (AZO CRANBERRY PO) Take 2 tablets by mouth daily at 12 noon.     diclofenac  sodium (VOLTAREN ) 1 % GEL Apply 2 g topically 4 (four) times daily. 100 g 3   fluconazole  (DIFLUCAN ) 100 MG tablet Take 1 tablet (100 mg total) by mouth daily. 7 tablet 0   Krill Oil 1000 MG CAPS Take by mouth.     metoprolol  tartrate (LOPRESSOR ) 25 MG tablet TAKE HALF A TABLET (12.5 MG TOTAL) BY MOUTH TWO (TWO) TIMES DAILY. 90 tablet 0   MULTIPLE VITAMIN PO Take 1 tablet by mouth daily.     nitroGLYCERIN  (NITROSTAT ) 0.4 MG SL tablet Place under the tongue.     OneTouch Delica Lancets 33G MISC TEST FASTING GLUCOSE LEVEL EACH MORNING BEFORE BREAKFAST 100 each 4   pantoprazole  (PROTONIX ) 40 MG tablet Take 40 mg by mouth daily.     potassium chloride  SA (KLOR-CON  M) 20 MEQ tablet Take 1 tablet (20 mEq total) by mouth daily. 7 tablet 0   ramipril   (ALTACE ) 1.25 MG capsule Take 1.25 mg by mouth daily.     ramipril  (ALTACE ) 5 MG capsule TAKE ONE CAPSULE (5 MG TOTAL) BY MOUTH TWO (TWO) TIMES DAILY. 180 capsule 1   No current facility-administered medications for this visit.    Past Medical History:  Diagnosis Date   Aortic atherosclerosis    Arthritis    Bilateral carotid artery disease    a.) s/p PTA/stenting 09/11/2021: 9 mm x 7 mm x 4 cm Exact stent to RIGHT   BPH (benign prostatic hyperplasia)    Chronic kidney disease    COPD (chronic obstructive pulmonary disease) (HCC)    Coronary artery disease    a.) PCI 12/11/2010: 99% pRCA (3 x 15 mm Xience V DES); b.) staged PCI 01/05/2011: 80 % pLCx (3.5 x 23 mm Xience V DES); c.) PCI 12/19/2018: 75% ISR pRCA (3 x 16 mm Synergy DES), 85% mRCA (3 x 20 mm Synergy DES), 95% mRCA (3 x 16 mm Synergy), 85% p-mLAD (rotational athrectomy + overlapping 3 x 24 mm and 3 x 28 mm Synergy DES)   DDD (degenerative disc disease), cervical    Diverticulosis    Dyspnea    Full dentures    GERD (gastroesophageal reflux disease)    History of heart artery  stent 12/11/2010   a.) TOTAL of 7 coronary stents as of 08/01/2023   Hypercholesteremia    Hypertension    Hypokalemia    Long term current use of clopidogrel     Nephrolithiasis    NSTEMI (non-ST elevated myocardial infarction) (HCC) 12/10/2010   a.) PCI 12/11/2010: 99% pRCA (3 x 15 mm Xience V DES); b.) staged PCI 01/05/2011: 80 % pLCx (3.5 x 23 mm Xience V DES)   On long term clopidogrel  therapy    Pneumonia    Renal colic    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   a.) stage II B (cT2, cN1, cM0) s/p concurrent chemoradiation (weekly CarboTaxol + 70 cGy over 7 weeks; completed 12/2021)   T2DM (type 2 diabetes mellitus) (HCC)     Past Surgical History:  Procedure Laterality Date   CAROTID PTA/STENT INTERVENTION Right 09/11/2021   Procedure: CAROTID PTA/STENT INTERVENTION;  Surgeon: [name], MD;  Location: [facility]  INVASIVE CV LAB;  Service: Cardiovascular;  Laterality: Right;   CATARACT EXTRACTION, BILATERAL     COLONOSCOPY  04/27/2005   COLONOSCOPY WITH PROPOFOL  N/A 06/08/2015   Procedure: COLONOSCOPY WITH PROPOFOL ;  Surgeon: Reyes LELON Cota, MD;  Location: [facility];  Service: Endoscopy;  Laterality: N/A;   CORONARY ANGIOPLASTY WITH STENT PLACEMENT Left 12/11/2010   Procedure: CORONARY ANGIOPLASTY WITH STENT PLACEMENT; Location: [facility]; Surgeon: Margie Lovelace, MD   CORONARY ANGIOPLASTY WITH STENT PLACEMENT Left 12/19/2018   Procedure: CORONARY ANGIOPLASTY WITH STENT PLACEMENT; Location: [facility]   CORONARY STENT INTERVENTION Left 01/05/2011   Procedure: CORONARY STENT INTERVENTION; Location: [facility]; Surgeon: Margie Lovelace, MD   CYSTOSCOPY WITH INSERTION OF UROLIFT N/A 02/11/2020   Procedure: CYSTOSCOPY WITH INSERTION OF UROLIFT;  Surgeon: Kassie Ozell SAUNDERS, MD;  Location: [facility] OR[facility];  Service: Urology;  Laterality: N/A;   CYSTOSCOPY WITH STENT PLACEMENT Right 09/16/2016   Procedure: CYSTOSCOPY WITH STENT PLACEMENT;  Surgeon: Kassie Ozell SAUNDERS, MD;  Location: [facility] OR[facility];  Service: Urology;  Laterality: Right;   EXTRACORPOREAL SHOCK WAVE LITHOTRIPSY Left 11/04/2014   has had 2 previous lithotripsies   EXTRACORPOREAL SHOCK WAVE LITHOTRIPSY Left 03/24/2015   Procedure: EXTRACORPOREAL SHOCK WAVE LITHOTRIPSY (ESWL);  Surgeon: Ozell SAUNDERS Kassie, MD;  Location: [facility] OR[facility];  Service: Urology;  Laterality: Left;   EXTRACORPOREAL SHOCK WAVE LITHOTRIPSY Right 10/04/2016   Procedure: EXTRACORPOREAL SHOCK WAVE LITHOTRIPSY (ESWL);  Surgeon: Kassie Ozell SAUNDERS, MD;  Location: [facility];  Service: Urology;  Laterality: Right;   LEFT HEART CATH AND CORONARY ANGIOGRAPHY N/A 12/05/2018   Procedure: LEFT HEART CATH AND CORONARY ANGIOGRAPHY with possible pci and stent;  Surgeon: Lovelace Cara BIRCH, MD;  Location: [facility] INVASIVE CV LAB;  Service: Cardiovascular;  Laterality: N/A;   PORTA CATH INSERTION Right 09/25/2021   Procedure:  PORTA CATH INSERTION;  Surgeon: [name], MD;  Location: [facility] IN[facility] CV [facility];  Service: Cardiovascular;  Laterality: Right;   VIDEO BRONCHOSCOPY WITH ENDOBRONCHIAL NAVIGATION N/A 08/02/2023   Procedure: VIDEO BRONCHOSCOPY WITH ENDOBRONCHIAL NAVIGATION;  Surgeon: [name], MD;  Location: [facility] ORS;  Service: Thoracic;  Laterality: N/A;   VIDEO BRONCHOSCOPY WITH ENDOBRONCHIAL ULTRASOUND N/A 08/02/2023   Procedure: BRONCHOSCOPY, WITH EBUS;  Surgeon: [name], MD;  Location: [facility] ORS;  Service: Thoracic;  Laterality: N/A;     Social History   Tobacco Use   Smoking status: Former    Current packs/day: 0.00    Average packs/day: 1 pack/day for 30.0 years (30.0 ttl pk-yrs)    Types: Cigarettes  Start date: 03/05/1954    Quit date: 03/05/1984    Years since quitting: 39.8   Smokeless tobacco: Current    Types: Chew   Tobacco comments:    Chews tobacco daily.  Vaping Use   Vaping status: Never Used  Substance Use Topics   Alcohol use: No    Alcohol/week: 0.0 standard drinks of alcohol   Drug use: No      Family History  Problem Relation Age of Onset   Pulmonary embolism Mother    Transient ischemic attack Mother    Diabetes Mother    Pancreatic cancer Father    Hypertension Father    Diabetes Father    Cirrhosis Brother      Allergies  Allergen Reactions   Codeine Nausea And Vomiting and Nausea Only    REVIEW OF SYSTEMS (Negative unless checked)   Constitutional: [] Weight loss  [] Fever  [] Chills Cardiac: [] Chest pain   [] Chest pressure   [] Palpitations   [] Shortness of breath when laying flat   [] Shortness of breath at rest   [x] Shortness of breath with exertion. Vascular:  [] Pain in legs with walking   [] Pain in legs at rest   [] Pain in legs when laying flat   [] Claudication   [] Pain in feet when walking  [] Pain in feet at rest  [] Pain in feet when laying flat   [] History of DVT   [] Phlebitis   [] Swelling in legs   [] Varicose veins   [] Non-healing  ulcers Pulmonary:   [] Uses home oxygen   [x] Productive cough   [] Hemoptysis   [] Wheeze  [x] COPD   [] Asthma Neurologic:  [] Dizziness  [] Blackouts   [] Seizures   [] History of stroke   [] History of TIA  [] Aphasia   [] Temporary blindness   [] Dysphagia   [] Weakness or numbness in arms   [] Weakness or numbness in legs Musculoskeletal:  [x] Arthritis   [] Joint swelling   [x] Joint pain   [] Low back pain Hematologic:  [x] Easy bruising  [] Easy bleeding   [] Hypercoagulable state   [] Anemic  [] Hepatitis Gastrointestinal:  [] Blood in stool   [] Vomiting blood  [] Gastroesophageal reflux/heartburn   [] Abdominal pain Genitourinary:  [] Chronic kidney disease   [] Difficult urination  [] Frequent urination  [] Burning with urination   [] Hematuria Skin:  [] Rashes   [] Ulcers   [] Wounds Psychological:  [] History of anxiety   []  History of major depression.  Physical Examination  Vitals:   01/17/24 0902  BP: (!) 148/83  Pulse: 81  Resp: 16  Weight: 170 lb (77.1 kg)   Body mass index is 25.1 kg/m. Gen:  WD/WN, NAD Head: [place]/AT, No temporalis wasting. Ear/Nose/Throat: Hearing grossly intact, nares w/o erythema or drainage, trachea midline Eyes: Conjunctiva clear. Sclera non-icteric Neck: Supple.  No bruit  Pulmonary:  Good air movement, equal and clear to auscultation bilaterally.  Cardiac: RRR, No JVD Vascular:  Vessel Right Left  Radial Palpable Palpable       Musculoskeletal: M/S 5/5 throughout.  No deformity or atrophy. No edema. Neurologic: CN 2-12 intact. Sensation grossly intact in extremities.  Symmetrical.  Speech is fluent. Motor exam as listed above. Psychiatric: Judgment intact, Mood & affect appropriate for pt's clinical situation. Dermatologic: No rashes or ulcers noted.  No cellulitis or open wounds.     CBC Lab Results  Component Value Date   WBC 5.2 09/23/2023   HGB 14.2 09/23/2023   HCT 42.0 09/23/2023   MCV 89.4 09/23/2023   PLT 146 (L) 09/23/2023    BMET    Component Value  Date/Time   NA 133 (L) 09/14/2023 1358   NA 138 04/30/2023 1136   NA 139 10/02/2011 1051   K 4.4 09/14/2023 1358   K 4.2 10/02/2011 1051   CL 102 09/14/2023 1358   CL 106 10/02/2011 1051   CO2 22 09/14/2023 1358   CO2 27 10/02/2011 1051   GLUCOSE 221 (H) 09/14/2023 1358   GLUCOSE 97 10/02/2011 1051   BUN 26 (H) 09/14/2023 1358   BUN 20 04/30/2023 1136   BUN 21 (H) 10/02/2011 1051   CREATININE 1.10 11/07/2023 1124   CREATININE 1.23 01/22/2023 1052   CREATININE 1.88 (H) 10/02/2011 1051   CALCIUM  8.8 (L) 09/14/2023 1358   CALCIUM  8.4 (L) 10/02/2011 1051   GFRNONAA >60 09/14/2023 1358   GFRNONAA >60 01/22/2023 1052   GFRNONAA 36 (L) 10/02/2011 1051   GFRAA 60 12/04/2019 0810   GFRAA 42 (L) 10/02/2011 1051   CrCl cannot be calculated (Patient's most recent lab result is older than the maximum 21 days allowed.).  COAG Lab Results  Component Value Date   INR 1.0 07/15/2023   INR 1.2 10/09/2022   INR 1.2 10/08/2022    Radiology No results found.   Assessment/Plan Carotid stenosis, right Carotid duplex today shows a widely patent right carotid stent and stable 1 to 39% left ICA stenosis.  Continue Plavix  and Lipitor.  Continue to follow on an annual basis with duplex.  Hypertension associated with diabetes (HCC) blood pressure control important in reducing the progression of atherosclerotic disease. On appropriate oral medications.     T2DM (type 2 diabetes mellitus) (HCC) blood glucose control important in reducing the progression of atherosclerotic disease. Also, involved in wound healing. On appropriate medications.     Hyperlipidemia associated with type 2 diabetes mellitus (HCC) lipid control important in reducing the progression of atherosclerotic disease. Continue statin therapy  Selinda Gu, MD  01/17/2024 9:38 AM    This note was created with Dragon medical transcription system.  Any errors from dictation are purely unintentional

## 2024-01-17 NOTE — Assessment & Plan Note (Signed)
 Carotid duplex today shows a widely patent right carotid stent and stable 1 to 39% left ICA stenosis.  Continue Plavix  and Lipitor.  Continue to follow on an annual basis with duplex.

## 2024-01-21 ENCOUNTER — Other Ambulatory Visit: Payer: Self-pay | Admitting: Family Medicine

## 2024-01-21 DIAGNOSIS — J329 Chronic sinusitis, unspecified: Secondary | ICD-10-CM

## 2024-01-22 ENCOUNTER — Inpatient Hospital Stay

## 2024-01-27 ENCOUNTER — Ambulatory Visit: Admitting: Radiation Oncology

## 2024-01-27 ENCOUNTER — Inpatient Hospital Stay: Attending: Oncology

## 2024-01-27 DIAGNOSIS — Z85118 Personal history of other malignant neoplasm of bronchus and lung: Secondary | ICD-10-CM | POA: Insufficient documentation

## 2024-01-27 DIAGNOSIS — Z452 Encounter for adjustment and management of vascular access device: Secondary | ICD-10-CM | POA: Insufficient documentation

## 2024-02-12 ENCOUNTER — Other Ambulatory Visit: Payer: Self-pay | Admitting: Pulmonary Disease

## 2024-02-12 ENCOUNTER — Telehealth: Payer: Self-pay | Admitting: Radiation Oncology

## 2024-02-12 NOTE — Telephone Encounter (Signed)
 Called pt to confirm CT date/time/location as a reminder - left vm w/appt info and asked pt to call me back if he needed to change anything - LH

## 2024-02-14 ENCOUNTER — Encounter: Payer: Self-pay | Admitting: Pulmonary Disease

## 2024-02-14 ENCOUNTER — Other Ambulatory Visit: Payer: Self-pay

## 2024-02-14 ENCOUNTER — Ambulatory Visit

## 2024-02-14 ENCOUNTER — Ambulatory Visit: Admitting: Anesthesiology

## 2024-02-14 ENCOUNTER — Encounter
Admission: RE | Disposition: A | Discharge status: Home / Self Care | Payer: Self-pay | Source: Home / Self Care | Attending: Pulmonary Disease

## 2024-02-14 ENCOUNTER — Ambulatory Visit
Admission: RE | Admit: 2024-02-14 | Discharge: 2024-02-14 | Disposition: A | Discharge status: Home / Self Care | Attending: Pulmonary Disease | Admitting: Pulmonary Disease

## 2024-02-14 DIAGNOSIS — Z87891 Personal history of nicotine dependence: Secondary | ICD-10-CM | POA: Diagnosis not present

## 2024-02-14 DIAGNOSIS — N189 Chronic kidney disease, unspecified: Secondary | ICD-10-CM | POA: Diagnosis not present

## 2024-02-14 DIAGNOSIS — T17500A Unspecified foreign body in bronchus causing asphyxiation, initial encounter: Secondary | ICD-10-CM | POA: Diagnosis present

## 2024-02-14 DIAGNOSIS — E1122 Type 2 diabetes mellitus with diabetic chronic kidney disease: Secondary | ICD-10-CM | POA: Diagnosis not present

## 2024-02-14 DIAGNOSIS — J449 Chronic obstructive pulmonary disease, unspecified: Secondary | ICD-10-CM | POA: Diagnosis not present

## 2024-02-14 DIAGNOSIS — I129 Hypertensive chronic kidney disease with stage 1 through stage 4 chronic kidney disease, or unspecified chronic kidney disease: Secondary | ICD-10-CM | POA: Diagnosis not present

## 2024-02-14 DIAGNOSIS — W448XXA Other foreign body entering into or through a natural orifice, initial encounter: Secondary | ICD-10-CM | POA: Diagnosis not present

## 2024-02-14 DIAGNOSIS — I700 Atherosclerosis of aorta: Secondary | ICD-10-CM | POA: Diagnosis not present

## 2024-02-14 DIAGNOSIS — E1151 Type 2 diabetes mellitus with diabetic peripheral angiopathy without gangrene: Secondary | ICD-10-CM | POA: Diagnosis not present

## 2024-02-14 DIAGNOSIS — I252 Old myocardial infarction: Secondary | ICD-10-CM | POA: Diagnosis not present

## 2024-02-14 DIAGNOSIS — K219 Gastro-esophageal reflux disease without esophagitis: Secondary | ICD-10-CM | POA: Diagnosis not present

## 2024-02-14 DIAGNOSIS — Z955 Presence of coronary angioplasty implant and graft: Secondary | ICD-10-CM | POA: Diagnosis not present

## 2024-02-14 HISTORY — PX: BRONCHIAL WASHINGS: SHX5105

## 2024-02-14 HISTORY — PX: FLEXIBLE BRONCHOSCOPY: SHX5094

## 2024-02-14 LAB — GLUCOSE, CAPILLARY: Glucose-Capillary: 119 mg/dL — ABNORMAL HIGH (ref 70–99)

## 2024-02-14 SURGERY — BRONCHOSCOPY, FLEXIBLE
Anesthesia: General

## 2024-02-14 MED ORDER — PHENYLEPHRINE 80 MCG/ML (10ML) SYRINGE FOR IV PUSH (FOR BLOOD PRESSURE SUPPORT)
PREFILLED_SYRINGE | INTRAVENOUS | Status: DC | PRN
  Administered 2024-02-14: 80 ug via INTRAVENOUS

## 2024-02-14 MED ORDER — ONDANSETRON HCL 4 MG/2ML IJ SOLN
INTRAMUSCULAR | Status: DC | PRN
  Administered 2024-02-14: 4 mg via INTRAVENOUS

## 2024-02-14 MED ORDER — FENTANYL CITRATE (PF) 100 MCG/2ML IJ SOLN
INTRAMUSCULAR | Status: AC
  Filled 2024-02-14: qty 2

## 2024-02-14 MED ORDER — DEXAMETHASONE SOD PHOSPHATE PF 10 MG/ML IJ SOLN
INTRAMUSCULAR | Status: DC | PRN
  Administered 2024-02-14: 10 mg via INTRAVENOUS

## 2024-02-14 MED ORDER — LIDOCAINE HCL (PF) 2 % IJ SOLN
INTRAMUSCULAR | Status: AC
  Filled 2024-02-14: qty 5

## 2024-02-14 MED ORDER — OXYCODONE HCL 5 MG PO TABS: 5.0000 mg | ORAL_TABLET | Freq: Once | ORAL | Status: DC | PRN

## 2024-02-14 MED ORDER — FENTANYL CITRATE (PF) 100 MCG/2ML IJ SOLN
INTRAMUSCULAR | Status: DC | PRN
  Administered 2024-02-14: 50 ug via INTRAVENOUS

## 2024-02-14 MED ORDER — ONDANSETRON HCL 4 MG/2ML IJ SOLN: 4.0000 mg | Freq: Once | INTRAMUSCULAR | Status: DC | PRN

## 2024-02-14 MED ORDER — ROCURONIUM BROMIDE 100 MG/10ML IV SOLN
INTRAVENOUS | Status: DC | PRN
  Administered 2024-02-14: 30 mg via INTRAVENOUS

## 2024-02-14 MED ORDER — PROPOFOL 10 MG/ML IV BOLUS
INTRAVENOUS | Status: DC | PRN
  Administered 2024-02-14: 150 mg via INTRAVENOUS

## 2024-02-14 MED ORDER — CHLORHEXIDINE GLUCONATE 0.12 % MT SOLN
15.0000 mL | Freq: Once | OROMUCOSAL | Status: AC
  Administered 2024-02-14: 15 mL via OROMUCOSAL

## 2024-02-14 MED ORDER — PHENYLEPHRINE 80 MCG/ML (10ML) SYRINGE FOR IV PUSH (FOR BLOOD PRESSURE SUPPORT)
PREFILLED_SYRINGE | INTRAVENOUS | Status: AC
  Filled 2024-02-14: qty 10

## 2024-02-14 MED ORDER — LACTATED RINGERS IV SOLN: INTRAVENOUS | Status: DC

## 2024-02-14 MED ORDER — CHLORHEXIDINE GLUCONATE 0.12 % MT SOLN
OROMUCOSAL | Status: AC
  Filled 2024-02-14: qty 15

## 2024-02-14 MED ORDER — FENTANYL CITRATE (PF) 100 MCG/2ML IJ SOLN: 25.0000 ug | INTRAMUSCULAR | Status: DC | PRN

## 2024-02-14 MED ORDER — ONDANSETRON HCL 4 MG/2ML IJ SOLN
INTRAMUSCULAR | Status: AC
  Filled 2024-02-14: qty 2

## 2024-02-14 MED ORDER — ROCURONIUM BROMIDE 10 MG/ML (PF) SYRINGE
PREFILLED_SYRINGE | INTRAVENOUS | Status: AC
  Filled 2024-02-14: qty 10

## 2024-02-14 MED ORDER — OXYCODONE HCL 5 MG/5ML PO SOLN: 5.0000 mg | Freq: Once | ORAL | Status: DC | PRN

## 2024-02-14 MED ORDER — ORAL CARE MOUTH RINSE: 15.0000 mL | Freq: Once | OROMUCOSAL | Status: AC

## 2024-02-14 MED ORDER — SUGAMMADEX SODIUM 200 MG/2ML IV SOLN
INTRAVENOUS | Status: DC | PRN
  Administered 2024-02-14: 600 mg via INTRAVENOUS

## 2024-02-14 MED ORDER — LIDOCAINE HCL (CARDIAC) PF 100 MG/5ML IV SOSY
PREFILLED_SYRINGE | INTRAVENOUS | Status: DC | PRN
  Administered 2024-02-14: 50 mg via INTRAVENOUS

## 2024-02-14 MED FILL — Propofol IV Emul 200 MG/20ML (10 MG/ML): INTRAVENOUS | Qty: 20 | Status: AC

## 2024-02-14 NOTE — Anesthesia Preprocedure Evaluation (Addendum)
 Anesthesia Evaluation  Patient identified by MRN, date of birth, ID band Patient awake    Reviewed: Allergy & Precautions, NPO status , Patient's Chart, lab work & pertinent test results  Airway Mallampati: III  TM Distance: >3 FB Neck ROM: full    Dental  (+) Upper Dentures, Lower Dentures   Pulmonary shortness of breath and with exertion, COPD,  COPD inhaler, former smoker   Pulmonary exam normal  + decreased breath sounds      Cardiovascular Exercise Tolerance: Poor hypertension, Pt. on medications + Past MI, + Cardiac Stents and + Peripheral Vascular Disease  Normal cardiovascular exam Rhythm:Regular Rate:Normal     Neuro/Psych negative neurological ROS  negative psych ROS   GI/Hepatic negative GI ROS, Neg liver ROS,GERD  Medicated,,  Endo/Other  diabetes, Gestational    Renal/GU CRFRenal disease     Musculoskeletal   Abdominal  (+) + obese  Peds negative pediatric ROS (+)  Hematology negative hematology ROS (+) Blood dyscrasia, anemia   Anesthesia Other Findings Past Medical History: No date: Aortic atherosclerosis No date: Arthritis No date: Bilateral carotid artery disease     Comment:  a.) s/p PTA/stenting 09/11/2021: 9 mm x 7 mm x 4 cm               Exact stent to RIGHT No date: BPH (benign prostatic hyperplasia) No date: Chronic kidney disease No date: COPD (chronic obstructive pulmonary disease) (HCC) No date: Coronary artery disease     Comment:  a.) PCI 12/11/2010: 99% pRCA (3 x 15 mm Xience V DES);               b.) staged PCI 01/05/2011: 80 % pLCx (3.5 x 23 mm Xience               V DES); c.) PCI 12/19/2018: 75% ISR pRCA (3 x 16 mm               Synergy DES), 85% mRCA (3 x 20 mm Synergy DES), 95% mRCA               (3 x 16 mm Synergy), 85% p-mLAD (rotational athrectomy +               overlapping 3 x 24 mm and 3 x 28 mm Synergy DES) No date: DDD (degenerative disc disease), cervical No date:  Diverticulosis No date: Dyspnea No date: Full dentures No date: GERD (gastroesophageal reflux disease) 12/11/2010: History of heart artery stent     Comment:  a.) TOTAL of 7 coronary stents as of 08/01/2023 No date: Hypercholesteremia No date: Hypertension No date: Hypokalemia No date: Long term current use of clopidogrel  No date: Nephrolithiasis 12/10/2010: NSTEMI (non-ST elevated myocardial infarction) (HCC)     Comment:  a.) PCI 12/11/2010: 99% pRCA (3 x 15 mm Xience V DES);               b.) staged PCI 01/05/2011: 80 % pLCx (3.5 x 23 mm Xience               V DES) No date: On long term clopidogrel  therapy No date: Pneumonia No date: Renal colic 09/2021: 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)     Comment:  a.) stage II B (cT2, cN1, cM0) s/p concurrent               chemoradiation (weekly CarboTaxol + 70 cGy over 7 weeks;  completed 12/2021) No date: T2DM (type 2 diabetes mellitus) (HCC)  Past Surgical History: 09/11/2021: CAROTID PTA/STENT INTERVENTION; Right     Comment:  Procedure: CAROTID PTA/STENT INTERVENTION;  Surgeon:               [name], MD;  Location: [facility] INVASIVE CV [facility];                Service: Cardiovascular;  Laterality: Right; No date: CATARACT EXTRACTION, BILATERAL 04/27/2005: COLONOSCOPY 06/08/2015: COLONOSCOPY WITH PROPOFOL ; N/A     Comment:  Procedure: COLONOSCOPY WITH PROPOFOL ;  Surgeon: Reyes LELON Cota, MD;  Location: [facility];  Service:               Endoscopy;  Laterality: N/A; 12/11/2010: CORONARY ANGIOPLASTY WITH STENT PLACEMENT; Left     Comment:  Procedure: CORONARY ANGIOPLASTY WITH STENT PLACEMENT;               Location: [facility]; Surgeon: Margie Lovelace, MD 12/19/2018: CORONARY ANGIOPLASTY WITH STENT PLACEMENT; Left     Comment:  Procedure: CORONARY ANGIOPLASTY WITH STENT PLACEMENT;               Location: [facility] 01/05/2011: CORONARY STENT INTERVENTION; Left     Comment:  Procedure:  CORONARY STENT INTERVENTION; Location: [facility];               Surgeon: Margie Lovelace, MD 02/11/2020: CYSTOSCOPY WITH INSERTION OF UROLIFT; N/A     Comment:  Procedure: CYSTOSCOPY WITH INSERTION OF UROLIFT;                Surgeon: Kassie Ozell SAUNDERS, MD;  Location: [facility];                Service: Urology;  Laterality: N/A; 09/16/2016: CYSTOSCOPY WITH STENT PLACEMENT; Right     Comment:  Procedure: CYSTOSCOPY WITH STENT PLACEMENT;  Surgeon:               Kassie Ozell SAUNDERS, MD;  Location: [facility];  Service:               Urology;  Laterality: Right; 11/04/2014: EXTRACORPOREAL SHOCK WAVE LITHOTRIPSY; Left     Comment:  has had 2 previous lithotripsies 03/24/2015: EXTRACORPOREAL SHOCK WAVE LITHOTRIPSY; Left     Comment:  Procedure: EXTRACORPOREAL SHOCK WAVE LITHOTRIPSY (ESWL);              Surgeon: Ozell SAUNDERS Kassie, MD;  Location: [facility];                Service: Urology;  Laterality: Left; 10/04/2016: EXTRACORPOREAL SHOCK WAVE LITHOTRIPSY; Right     Comment:  Procedure: EXTRACORPOREAL SHOCK WAVE LITHOTRIPSY (ESWL);              Surgeon: Kassie Ozell SAUNDERS, MD;  Location: [facility];                Service: Urology;  Laterality: Right; 12/05/2018: LEFT HEART CATH AND CORONARY ANGIOGRAPHY; N/A     Comment:  Procedure: LEFT HEART CATH AND CORONARY ANGIOGRAPHY with              possible pci and stent;  Surgeon: Lovelace Cara BIRCH, MD;              Location: [facility];  Service: Cardiovascular;              Laterality: N/A; 09/25/2021: PORTA CATH INSERTION; Right  Comment:  Procedure: PORTA CATH INSERTION;  Surgeon: [name],              MD;  Location: [facility];  Service:               Cardiovascular;  Laterality: Right; 08/02/2023: VIDEO BRONCHOSCOPY WITH ENDOBRONCHIAL NAVIGATION; N/A     Comment:  Procedure: VIDEO BRONCHOSCOPY WITH ENDOBRONCHIAL               NAVIGATION;  Surgeon: [name], MD;  Location:               [facility] OR[facility];  Service: Thoracic;   Laterality: N/A; 08/02/2023: VIDEO BRONCHOSCOPY WITH ENDOBRONCHIAL ULTRASOUND; N/A     Comment:  Procedure: BRONCHOSCOPY, WITH EBUS;  Surgeon: [name], MD;  Location: [facility];  Service: Thoracic;                Laterality: N/A;  BMI    Body Mass Index: 25.70 kg/m      Reproductive/Obstetrics negative OB ROS                              Anesthesia Physical Anesthesia Plan  ASA: 3  Anesthesia Plan: General   Post-op Pain Management:    Induction: Intravenous  PONV Risk Score and Plan: Ondansetron , Dexamethasone , Midazolam  and Treatment may vary due to age or medical condition  Airway Management Planned: Oral ETT  Additional Equipment:   Intra-op Plan:   Post-operative Plan: Extubation in OR  Informed Consent: I have reviewed the patients History and Physical, chart, labs and discussed the procedure including the risks, benefits and alternatives for the proposed anesthesia with the patient or authorized representative who has indicated his/her understanding and acceptance.     Dental Advisory Given  Plan Discussed with: CRNA  Anesthesia Plan Comments:          Anesthesia Quick Evaluation

## 2024-02-14 NOTE — Anesthesia Postprocedure Evaluation (Signed)
 Anesthesia Post Note  Patient: Luke Rojas  Procedure(s) Performed: BRONCHOSCOPY, FLEXIBLE IRRIGATION, BRONCHUS  Patient location during evaluation: PACU Anesthesia Type: General Level of consciousness: awake Pain management: satisfactory to patient Vital Signs Assessment: post-procedure vital signs reviewed and stable Respiratory status: spontaneous breathing Cardiovascular status: stable Anesthetic complications: no   No notable events documented.   Last Vitals:  Vitals:   02/14/24 1056  BP: (!) 151/92  Pulse: 86  Resp: 16  Temp: 36.6 C  SpO2: 98%    Last Pain:  Vitals:   02/14/24 1056  TempSrc: Temporal  PainSc: 0-No pain                 VAN STAVEREN,[place]

## 2024-02-14 NOTE — Procedures (Signed)
°  PROCEDURE: BRONCHOSCOPY Therapeutic Aspiration of Tracheobronchial Tree and bronchoalveolar lavage  PROCEDURE DATE: 02/14/2024  TIME:  NAME:  Luke Rojas  DOB:[birth year]  MRN: [id number] LOC:  [facility]/None    HOSP DAY: @LENGTHOFSTAYDAYS @ CODE STATUS:   Code Status History     Date Active Date Inactive Code Status Order ID Comments User Context   10/08/2022 2300 10/09/2022 1748 Full Code [id number]  Cleatus Delayne GAILS, MD ED   10/15/2021 2214 10/17/2021 1948 Full Code [id number]  Cleatus Delayne GAILS, MD ED   09/11/2021 0911 09/12/2021 1622 Full Code [id number]  [name], MD Inpatient   12/05/2018 0232 12/05/2018 1950 Full Code [id number]  Jenel Lenis, MD Inpatient   11/24/2018 1834 11/25/2018 1800 Full Code [id number]  Laurence Bridegroom, MD Inpatient    Questions for Most Recent Historical Code Status (Order [id number])     Question Answer   By: Other                Indications/Preliminary Diagnosis:   Consent: (Place X beside choice/s below)  The benefits, risks and possible complications of the procedure were        explained to:  __x_ patient  ___ patient's family  ___ other:___________  who verbalized understanding and gave:  ___ verbal  _x__ written  ___ verbal and written  ___ telephone  ___ other:________ consent.      Unable to obtain consent; procedure performed on emergent basis.     Other:       PRESEDATION ASSESSMENT: History and Physical has been performed. Patient meds and allergies have been reviewed. Presedation airway examination has been performed and documented. Baseline vital signs, sedation score, oxygenation status, and cardiac rhythm were reviewed. Patient was deemed to be in satisfactory condition to undergo the procedure.        PROCEDURE DETAILS: Timeout performed and correct patient, name, & ID confirmed. Following prep per Pulmonary policy, appropriate sedation was administered. The Bronchoscope was inserted in to oral cavity with bite block in place.  Therapeutic aspiration of Tracheobronchial tree was performed.  Airway exam proceeded with findings, technical procedures, and specimen collection as noted below. At the end of exam the scope was withdrawn without incident. Impression and Plan as noted below.       TECHNICAL PROCEDURES: (Place X beside choice below)   Procedures  Description    None     Electrocautery     Cryotherapy     Balloon Dilatation     Bronchography     Stent Placement   x  Therapeutic Aspiration bilaterally    Laser/Argon Plasma    Brachytherapy Catheter Placement    Foreign Body Removal         SPECIMENS (Sites): (Place X beside choice below)  Specimens Description   No Specimens Obtained     [place]ashings   x Lavage R[facility]   Biopsies    Fine Needle Aspirates    Brushings    Sputum    FINDINGS: mucus plugging of bronchi bilaterally ESTIMATED BLOOD LOSS: none COMPLICATIONS/RESOLUTION: none      IMPRESSION:POST-PROCEDURE DX:  BAL was done at R[facility] sent for microbiolgoy  Therapeutic aspiration performed bilaterally worse at [facility]   RECOMMENDATION/PLAN:  Await cultures      Halina Picking, M.D.  Pulmonary & Critical Care Medicine  [facility]

## 2024-02-14 NOTE — Anesthesia Procedure Notes (Signed)
 Procedure Name: Intubation Date/Time: 02/14/2024 12:50 PM  Performed by: Trudy Rankin LABOR, CRNAPre-anesthesia Checklist: Patient identified, Patient being monitored, Timeout performed, Emergency Drugs available and Suction available Patient Re-evaluated:Patient Re-evaluated prior to induction Oxygen Delivery Method: Circle System Utilized Preoxygenation: Pre-oxygenation with 100% oxygen Induction Type: IV induction Ventilation: Mask ventilation without difficulty and Oral airway inserted - appropriate to patient size Laryngoscope Size: Mac, 3 and McGrath Grade View: Grade I Tube type: Oral Tube size: 8.5 mm Number of attempts: 1 Airway Equipment and Method: Stylet and Video-laryngoscopy Placement Confirmation: ETT inserted through vocal cords under direct vision, positive ETCO2 and breath sounds checked- equal and bilateral Secured at: 22 cm Tube secured with: Tape Dental Injury: Teeth and Oropharynx as per pre-operative assessment

## 2024-02-14 NOTE — H&P (Signed)
 PULMONOLOGY         Date: 02/14/2024,   MRN# [id number] Luke Rojas [birth year]     AdmissionWeight: 78.9 kg                 CurrentWeight: 78.9 kg  Referring provider: Dr Cleotilde    CHIEF COMPLAINT:   Mucous plugging of bronchi with recurrent pneumonia   HISTORY OF PRESENT ILLNESS   This is a pleasant 78 year old male with a history of osteoarthritis, bilateral carotid disease, CKD COPD degenerative disc disease diverticulosis chronic dyspnea and GERD, history of coronary stenting and lung cancer with chronic dyspnea due to recurrent mucous plugging and bronchitis.  Patient was seen in clinic past week with pulmonology service reports difficulty expectorating thick mucus.  Reports feeling heavy phlegm and inability to expectorate mucus with resultant hypoxemia.  We discussed potential noninvasive therapy however patient already uses nebulizer with both ensifentrine  neb treatment as well as DuoNeb and has tried flutter valve incentive spirometry chest physiotherapy and hypertonic saline without much success.  Today he is here for flexible bronchoscopy for airway inspection, therapeutic aspiration of tracheobronchial tree for mucous plugging and bronchoalveolar lavage for microbiology.  Reviewed risks/complications and benefits with patient, risks include infection, pneumothorax/pneumomediastinum which may require chest tube placement as well as overnight/prolonged hospitalization and possible mechanical ventilation. Other risks include bleeding and very rarely death.  Patient understands risks and wishes to proceed.  Additional questions were answered, and patient is aware that post procedure patient will be going home with family and may experience cough with possible clots on expectoration as well as phlegm which may last few days as well as hoarseness of voice post intubation and mechanical ventilation.    PAST MEDICAL HISTORY   Past Medical History:  Diagnosis Date    Aortic atherosclerosis    Arthritis    Bilateral carotid artery disease    a.) s/p PTA/stenting 09/11/2021: 9 mm x 7 mm x 4 cm Exact stent to RIGHT   BPH (benign prostatic hyperplasia)    Chronic kidney disease    COPD (chronic obstructive pulmonary disease) (HCC)    Coronary artery disease    a.) PCI 12/11/2010: 99% pRCA (3 x 15 mm Xience V DES); b.) staged PCI 01/05/2011: 80 % pLCx (3.5 x 23 mm Xience V DES); c.) PCI 12/19/2018: 75% ISR pRCA (3 x 16 mm Synergy DES), 85% mRCA (3 x 20 mm Synergy DES), 95% mRCA (3 x 16 mm Synergy), 85% p-mLAD (rotational athrectomy + overlapping 3 x 24 mm and 3 x 28 mm Synergy DES)   DDD (degenerative disc disease), cervical    Diverticulosis    Dyspnea    Full dentures    GERD (gastroesophageal reflux disease)    History of heart artery stent 12/11/2010   a.) TOTAL of 7 coronary stents as of 08/01/2023   Hypercholesteremia    Hypertension    Hypokalemia    Long term current use of clopidogrel     Nephrolithiasis    NSTEMI (non-ST elevated myocardial infarction) (HCC) 12/10/2010   a.) PCI 12/11/2010: 99% pRCA (3 x 15 mm Xience V DES); b.) staged PCI 01/05/2011: 80 % pLCx (3.5 x 23 mm Xience V DES)   On long term clopidogrel  therapy    Pneumonia    Renal colic    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   a.) stage II B (cT2, cN1, cM0) s/p concurrent chemoradiation (weekly CarboTaxol + 70 cGy over  7 weeks; completed 12/2021)   T2DM (type 2 diabetes mellitus) (HCC)      SURGICAL HISTORY   Past Surgical History:  Procedure Laterality Date   CAROTID PTA/STENT INTERVENTION Right 09/11/2021   Procedure: CAROTID PTA/STENT INTERVENTION;  Surgeon: [name], MD;  Location: [facility];  Service: Cardiovascular;  Laterality: Right;   CATARACT EXTRACTION, BILATERAL     COLONOSCOPY  04/27/2005   COLONOSCOPY WITH PROPOFOL  N/A 06/08/2015   Procedure: COLONOSCOPY WITH PROPOFOL ;  Surgeon: Reyes LELON Cota, MD;   Location: [facility];  Service: Endoscopy;  Laterality: N/A;   CORONARY ANGIOPLASTY WITH STENT PLACEMENT Left 12/11/2010   Procedure: CORONARY ANGIOPLASTY WITH STENT PLACEMENT; Location: [facility]; Surgeon: Margie Lovelace, MD   CORONARY ANGIOPLASTY WITH STENT PLACEMENT Left 12/19/2018   Procedure: CORONARY ANGIOPLASTY WITH STENT PLACEMENT; Location: [facility]   CORONARY STENT INTERVENTION Left 01/05/2011   Procedure: CORONARY STENT INTERVENTION; Location: [facility]; Surgeon: Margie Lovelace, MD   CYSTOSCOPY WITH INSERTION OF UROLIFT N/A 02/11/2020   Procedure: CYSTOSCOPY WITH INSERTION OF UROLIFT;  Surgeon: Kassie Ozell SAUNDERS, MD;  Location: [facility];  Service: Urology;  Laterality: N/A;   CYSTOSCOPY WITH STENT PLACEMENT Right 09/16/2016   Procedure: CYSTOSCOPY WITH STENT PLACEMENT;  Surgeon: Kassie Ozell SAUNDERS, MD;  Location: [facility];  Service: Urology;  Laterality: Right;   EXTRACORPOREAL SHOCK WAVE LITHOTRIPSY Left 11/04/2014   has had 2 previous lithotripsies   EXTRACORPOREAL SHOCK WAVE LITHOTRIPSY Left 03/24/2015   Procedure: EXTRACORPOREAL SHOCK WAVE LITHOTRIPSY (ESWL);  Surgeon: Ozell SAUNDERS Kassie, MD;  Location: [facility];  Service: Urology;  Laterality: Left;   EXTRACORPOREAL SHOCK WAVE LITHOTRIPSY Right 10/04/2016   Procedure: EXTRACORPOREAL SHOCK WAVE LITHOTRIPSY (ESWL);  Surgeon: Kassie Ozell SAUNDERS, MD;  Location: [facility];  Service: Urology;  Laterality: Right;   LEFT HEART CATH AND CORONARY ANGIOGRAPHY N/A 12/05/2018   Procedure: LEFT HEART CATH AND CORONARY ANGIOGRAPHY with possible pci and stent;  Surgeon: Lovelace Cara BIRCH, MD;  Location: [facility];  Service: Cardiovascular;  Laterality: N/A;   PORTA CATH INSERTION Right 09/25/2021   Procedure: PORTA CATH INSERTION;  Surgeon: [name], MD;  Location: [facility];  Service: Cardiovascular;  Laterality: Right;   VIDEO BRONCHOSCOPY WITH ENDOBRONCHIAL NAVIGATION N/A 08/02/2023   Procedure: VIDEO BRONCHOSCOPY WITH ENDOBRONCHIAL  NAVIGATION;  Surgeon: [name], MD;  Location: [facility];  Service: Thoracic;  Laterality: N/A;   VIDEO BRONCHOSCOPY WITH ENDOBRONCHIAL ULTRASOUND N/A 08/02/2023   Procedure: BRONCHOSCOPY, WITH EBUS;  Surgeon: [name], MD;  Location: [facility];  Service: Thoracic;  Laterality: N/A;     FAMILY HISTORY   Family History  Problem Relation Age of Onset   Pulmonary embolism Mother    Transient ischemic attack Mother    Diabetes Mother    Pancreatic cancer Father    Hypertension Father    Diabetes Father    Cirrhosis Brother      SOCIAL HISTORY   Social History[1]   MEDICATIONS    Home Medication:    Current Medication: Current Medications[2]    ALLERGIES   Codeine     REVIEW OF SYSTEMS    Review of Systems:  Gen:  Denies  fever, sweats, chills weigh loss  HEENT: Denies blurred vision, double vision, ear pain, eye pain, hearing loss, nose bleeds, sore throat Cardiac:  No dizziness, chest pain or heaviness, chest tightness,edema Resp:   reports dyspnea chronically  Gi: Denies swallowing difficulty, stomach pain, nausea or vomiting, diarrhea, constipation, bowel incontinence Gu:  Denies bladder incontinence, burning urine Ext:   Denies Joint pain, stiffness or swelling Skin: Denies  skin rash, easy bruising or bleeding or hives Endoc:  Denies polyuria, polydipsia , polyphagia or weight change Psych:   Denies depression, insomnia or hallucinations   Other:  All other systems negative   VS: BP (!) 151/92   Pulse 86   Temp 97.8 F (36.6 C) (Temporal)   Resp 16   Ht 5' 9 (1.753 m)   Wt 78.9 kg   SpO2 98%   BMI 25.70 kg/m      PHYSICAL EXAM    GENERAL:NAD, no fevers, chills, no weakness no fatigue HEAD: Normocephalic, atraumatic.  EYES: Pupils equal, round, reactive to light. Extraocular muscles intact. No scleral icterus.  MOUTH: Moist mucosal membrane. Dentition intact. No abscess noted.  EAR, NOSE, THROAT: Clear without exudates. No  external lesions.  NECK: Supple. No thyromegaly. No nodules. No JVD.  PULMONARY: decreased breath sounds with mild rhonchi worse at bases bilaterally.  CARDIOVASCULAR: S1 and S2. Regular rate and rhythm. No murmurs, rubs, or gallops. No edema. Pedal pulses 2+ bilaterally.  GASTROINTESTINAL: Soft, nontender, nondistended. No masses. Positive bowel sounds. No hepatosplenomegaly.  MUSCULOSKELETAL: No swelling, clubbing, or edema. Range of motion full in all extremities.  NEUROLOGIC: Cranial nerves II through XII are intact. No gross focal neurological deficits. Sensation intact. Reflexes intact.  SKIN: No ulceration, lesions, rashes, or cyanosis. Skin warm and dry. Turgor intact.  PSYCHIATRIC: Mood, affect within normal limits. The patient is awake, alert and oriented x 3. Insight, judgment intact.       IMAGING   Narrative & Impression  CLINICAL DATA:  Recurrent non-small cell lung cancer restaging * Tracking Code: BO *   EXAM: CT CHEST, ABDOMEN, AND PELVIS WITH CONTRAST   TECHNIQUE: Multidetector CT imaging of the chest, abdomen and pelvis was performed following the standard protocol during bolus administration of intravenous contrast.   RADIATION DOSE REDUCTION: This exam was performed according to the departmental dose-optimization program which includes automated exposure control, adjustment of the mA and/or kV according to patient size and/or use of iterative reconstruction technique.   CONTRAST:  OMNIPAQUE  IOHEXOL  300 MG/ML  SOLN   COMPARISON:  PET-CT, 07/19/2023   FINDINGS: CT CHEST FINDINGS   Cardiovascular: Right chest port catheter. Aortic atherosclerosis. Normal heart size. Three-vessel coronary artery calcifications. No pericardial effusion.   Mediastinum/Nodes: Unchanged treated soft tissue about the right hilum. Unchanged enlarged subcarinal lymph node measuring 2.5 x 1.5 cm (series 2, image 27). Thyroid  gland, trachea, and esophagus demonstrate no  significant findings.   Lungs/Pleura: Slightly diminished size of a treated mass in the right upper lobe measuring 4.3 x 3.7 cm, previously 4.6 x 4.5 cm when measured similarly (series 2, image 20). Similar appearance of fibrotic scarring and bronchiectasis, particularly in the superior segment right lower lobe. 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 Mild centrilobular and paraseptal emphysema. Trace right pleural effusion, increased compared to prior examination.   Musculoskeletal: No chest wall abnormality. No acute osseous findings.   CT ABDOMEN PELVIS FINDINGS   Hepatobiliary: No solid liver abnormality is seen. No gallstones, gallbladder wall thickening, or biliary dilatation.   Pancreas: Unremarkable. No pancreatic ductal dilatation or surrounding inflammatory changes.   Spleen: Normal in size without significant abnormality.   Adrenals/Urinary Tract: Adrenal glands are unremarkable. Kidneys are normal, without renal calculi, solid lesion, or hydronephrosis. Bladder is unremarkable.   Stomach/Bowel: Stomach is within normal limits. Appendix appears normal. No  evidence of bowel wall thickening, distention, or inflammatory changes.   Vascular/Lymphatic: Aortic atherosclerosis. No enlarged abdominal or pelvic lymph nodes.   Reproductive: Prostatomegaly.  Urolift implants.   Other: Small fat containing bilateral inguinal hernias. Small fat containing umbilical hernia. No ascites.   Musculoskeletal: No acute osseous findings.   IMPRESSION: 1. Slightly diminished size of a treated mass in the right upper lobe. 2. Unchanged treated soft tissue about the right hilum. 3. Unchanged enlarged subcarinal lymph node. 4. No evidence of lymphadenopathy or metastatic disease in the abdomen or pelvis. 5. Trace right pleural effusion, increased compared to prior examination. 6. Emphysema. 7. Coronary artery disease.   Aortic Atherosclerosis  (ICD10-I70.0) and Emphysema (ICD10-J43.9).     Electronically Signed   By: Marolyn JONETTA Jaksch M.D.   On: 11/15/2023 21:52      ASSESSMENT/PLAN   Mucus plugging of bronchi bilaterally   Today he is here for flexible bronchoscopy for airway inspection, therapeutic aspiration of tracheobronchial tree for mucous plugging and bronchoalveolar lavage for microbiology.  Reviewed risks/complications and benefits with patient, risks include infection, pneumothorax/pneumomediastinum which may require chest tube placement as well as overnight/prolonged hospitalization and possible mechanical ventilation. Other risks include bleeding and very rarely death.  Patient understands risks and wishes to proceed.  Additional questions were answered, and patient is aware that post procedure patient will be going home with family and may experience cough with possible clots on expectoration as well as phlegm which may last few days as well as hoarseness of voice post intubation and mechanical ventilation.            Thank you for allowing me to participate in the care of this patient.   Patient/Family are satisfied with care plan and all questions have been answered.    Provider disclosure: Patient with at least one acute or chronic illness or injury that poses a threat to life or bodily function and is being managed actively during this encounter.  All of the below services have been performed independently by signing provider:  review of prior documentation from internal and or external health records.  Review of previous and current lab results.  Interview and comprehensive assessment during patient visit today. Review of current and previous chest radiographs/CT scans. Discussion of management and test interpretation with health care team and patient/family.   This document was prepared using Dragon voice recognition software and may include unintentional dictation errors.     Anden Bartolo, M.D.   Division of Pulmonary & Critical Care Medicine             [1]  Social History Tobacco Use   Smoking status: Former    Current packs/day: 0.00    Average packs/day: 1 pack/day for 30.0 years (30.0 ttl pk-yrs)    Types: Cigarettes    Start date: 03/05/1954    Quit date: 03/05/1984    Years since quitting: 39.9   Smokeless tobacco: Current    Types: Chew   Tobacco comments:    Chews tobacco daily.  Vaping Use   Vaping status: Never Used  Substance Use Topics   Alcohol use: No    Alcohol/week: 0.0 standard drinks of alcohol   Drug use: No  [2]  Current Facility-Administered Medications:    lactated ringers  infusion, , Intravenous, Continuous, Vicci Camellia Glatter, MD, Last Rate: 10 mL/hr at 02/14/24 1112, New Bag at 02/14/24 1112

## 2024-02-14 NOTE — Transfer of Care (Signed)
 Immediate Anesthesia Transfer of Care Note  Patient: Luke Rojas  Procedure(s) Performed: BRONCHOSCOPY, FLEXIBLE IRRIGATION, BRONCHUS  Patient Location: PACU  Anesthesia Type:General  Level of Consciousness: awake and alert   Airway & Oxygen Therapy: Patient Spontanous Breathing and Patient connected to face mask oxygen  Post-op Assessment: Report given to RN and Post -op Vital signs reviewed and stable  Post vital signs: Reviewed and stable  Last Vitals:  Vitals Value Taken Time  BP 120/74 1320  Temp 97.2 f 1320  Pulse 8 1320  Resp 20 1320  SpO2 98 1320    Last Pain:  Vitals:   02/14/24 1056  TempSrc: Temporal  PainSc: 0-No pain         Complications: No notable events documented.

## 2024-02-15 ENCOUNTER — Encounter: Payer: Self-pay | Admitting: Pulmonary Disease

## 2024-02-17 ENCOUNTER — Ambulatory Visit
Admission: RE | Admit: 2024-02-17 | Discharge: 2024-02-17 | Disposition: A | Source: Ambulatory Visit | Attending: Oncology

## 2024-02-17 DIAGNOSIS — Z08 Encounter for follow-up examination after completed treatment for malignant neoplasm: Secondary | ICD-10-CM | POA: Insufficient documentation

## 2024-02-17 DIAGNOSIS — C3411 Malignant neoplasm of upper lobe, right bronchus or lung: Secondary | ICD-10-CM | POA: Diagnosis present

## 2024-02-17 DIAGNOSIS — Z85118 Personal history of other malignant neoplasm of bronchus and lung: Secondary | ICD-10-CM | POA: Insufficient documentation

## 2024-02-17 LAB — POCT I-STAT CREATININE: Creatinine, Ser: 1.1 mg/dL (ref 0.61–1.24)

## 2024-02-17 LAB — CULTURE, BAL-QUANTITATIVE W GRAM STAIN: Culture: NO GROWTH

## 2024-02-17 MED ORDER — IOHEXOL 300 MG/ML  SOLN
100.0000 mL | Freq: Once | INTRAMUSCULAR | Status: AC | PRN
  Administered 2024-02-17: 11:00:00 100 mL via INTRAVENOUS

## 2024-02-18 ENCOUNTER — Encounter: Payer: Self-pay | Admitting: Pulmonary Disease

## 2024-02-21 ENCOUNTER — Telehealth: Payer: Self-pay | Admitting: Radiation Oncology

## 2024-02-21 NOTE — Telephone Encounter (Signed)
 Pt wife called to confirm appt date/time for Monday [facility]

## 2024-02-24 ENCOUNTER — Encounter: Payer: Self-pay | Admitting: Radiation Oncology

## 2024-02-24 ENCOUNTER — Telehealth: Payer: Self-pay | Admitting: Radiation Oncology

## 2024-02-24 ENCOUNTER — Ambulatory Visit
Admission: RE | Admit: 2024-02-24 | Discharge: 2024-02-24 | Disposition: A | Discharge status: Home / Self Care | Source: Ambulatory Visit | Attending: Radiation Oncology | Admitting: Radiation Oncology

## 2024-02-24 VITALS — HR 84 | Temp 97.0°F | Resp 18 | Ht 69.0 in | Wt 173.1 lb

## 2024-02-24 DIAGNOSIS — C3411 Malignant neoplasm of upper lobe, right bronchus or lung: Secondary | ICD-10-CM | POA: Insufficient documentation

## 2024-02-24 DIAGNOSIS — R918 Other nonspecific abnormal finding of lung field: Secondary | ICD-10-CM

## 2024-02-24 DIAGNOSIS — Z87891 Personal history of nicotine dependence: Secondary | ICD-10-CM | POA: Insufficient documentation

## 2024-02-24 DIAGNOSIS — Z923 Personal history of irradiation: Secondary | ICD-10-CM | POA: Diagnosis not present

## 2024-02-24 NOTE — Progress Notes (Signed)
 Radiation Oncology Follow up Note  Name: Luke Rojas   Date:   02/24/2024 MRN:  [id number] DOB: [birth year]    This 78 y.o. male presents to the clinic today for 57-month follow-up status post radiation therapy for recurrent non-small cell lung cancer favoring squamous cell carcinoma the right lung and patient now out 2-1/2 years had completed concurrent chemoradiation therapy for stage IIIa non-small cell lung cancer.SABRA  REFERRING PROVIDER: [name], MD  HPI: Patient is a 78 year old male now out 4 months having completed radiation therapy for recurrent non-small cell lung cancer favoring squamous cell carcinoma.  Seen today in routine follow-up he is doing fairly well he recently had a bronchoscopy and pulmonary toiletry by pulmonology for thick secretions which has drastically improved his breathing.  He is not having significant cough hemoptysis or chest tightness at this time.SABRA  He had a recent CT scan of the chest which I have reviewed showing similar changes with radiation fibrosis and scarring no evidence of progressive disease mediastinal adenopathy on my review of his films which have not been officially read by radiology.  Gram stain was negative.  COMPLICATIONS OF TREATMENT: none  FOLLOW UP COMPLIANCE: keeps appointments   PHYSICAL EXAM:  Pulse 84   Temp (!) 97 F (36.1 C)   Resp 18   Ht 5' 9 (1.753 m)   Wt 173 lb 1.6 oz (78.5 kg)   BMI 25.56 kg/m  Well-developed well-nourished patient in NAD. HEENT reveals PERLA, EOMI, discs not visualized.  Oral cavity is clear. No oral mucosal lesions are identified. Neck is clear without evidence of cervical or supraclavicular adenopathy. Lungs are clear to A&P. Cardiac examination is essentially unremarkable with regular rate and rhythm without murmur rub or thrill. Abdomen is benign with no organomegaly or masses noted. Motor sensory and DTR levels are equal and symmetric in the upper and lower extremities. Cranial nerves  II through XII are grossly intact. Proprioception is intact. No peripheral adenopathy or edema is identified. No motor or sensory levels are noted. Crude visual fields are within normal range.  RADIOLOGY RESULTS: CT scan reviewed compared to prior films  PLAN: Present time patient is doing well.  Much improved since his recent bronchoscopy by pulmonology.  His chest on my review seems stable I will review the formal report when that becomes available.  Otherwise have asked to see him back in 6 months with a follow-up CT scan at that time.  He continues close follow-up care with pulmonology.  Patient knows to call with any concerns.  I would like to take this opportunity to thank you for allowing me to participate in the care of your patient.SABRA Marcey Penton, MD

## 2024-02-24 NOTE — Telephone Encounter (Signed)
 Called pt to sched CT - pt confirmed date/time/location - states he will be leaving for vacation on 6/12 and will be out of town for a few days - pt requested appt reminder via mail - LH

## 2024-03-06 ENCOUNTER — Other Ambulatory Visit: Payer: Self-pay | Admitting: Internal Medicine

## 2024-03-06 DIAGNOSIS — R2689 Other abnormalities of gait and mobility: Secondary | ICD-10-CM

## 2024-03-06 DIAGNOSIS — E1159 Type 2 diabetes mellitus with other circulatory complications: Secondary | ICD-10-CM

## 2024-03-06 DIAGNOSIS — I951 Orthostatic hypotension: Secondary | ICD-10-CM

## 2024-03-09 NOTE — Telephone Encounter (Signed)
 Requested medication (s) are due for refill today: na   Requested medication (s) are on the active medication list: yes   Last refill:  12/09/23 #90 0 refills  Future visit scheduled: yes 05/28/24  Notes to clinic:  last ordered by E. Bernardo, DO 12/09/23. Do you want to order Rx or refill until next OV?     Requested Prescriptions  Pending Prescriptions Disp Refills   metoprolol  tartrate (LOPRESSOR ) 25 MG tablet [Pharmacy Med Name: METOPROLOL  TARTRATE 25MG  TABLET] 90 tablet 0    Sig: TAKE HALF A TABLET (12.5 MG TOTAL) BY MOUTH TWO TIMES DAILY.     Cardiovascular:  Beta Blockers Failed - 03/09/2024 12:03 PM      Failed - Last BP in normal range    BP Readings from Last 1 Encounters:  02/14/24 (!) 160/96         Failed - Valid encounter within last 6 months    Recent Outpatient Visits           3 months ago Type 2 diabetes mellitus with other specified complication, without long-term current use of insulin  [facility])    [facility] [place], Jon HERO, MD   7 months ago Hearing loss of both ears due to cerumen impaction   [facility] Bernardo Fend, DO   9 months ago Orthostatic hypotension   [facility] Bernardo Fend, DO   9 months ago Orthostatic hypotension   [facility] Bernardo Fend, DO   10 months ago Encounter for annual physical exam   [facility] [place], Jon HERO, MD              Passed - Last Heart Rate in normal range    Pulse Readings from Last 1 Encounters:  02/24/24 84

## 2024-03-17 ENCOUNTER — Ambulatory Visit: Payer: Self-pay

## 2024-03-17 NOTE — Telephone Encounter (Signed)
FYI - seeing you tomorrow

## 2024-03-17 NOTE — Progress Notes (Unsigned)
" °  ° ° °  Established patient visit   Patient: Luke Rojas   DOB: 10-21-1945   79 y.o. Male  MRN: [id number] Visit Date: 03/18/2024  Today's healthcare provider: Isaiah DELENA Pepper, MD   No chief complaint on file.  Subjective    HPI  Discussed the use of AI scribe software for clinical note transcription with the patient, who gave verbal consent to proceed.  History of Present Illness      Medications: Show/hide medication list[1]  Review of Systems as noted in HPI.  {Insert previous labs (optional):23779} {See past labs  Heme  Chem  Endocrine  Serology  Results Review (optional):1}   Objective    There were no vitals taken for this visit. {Insert last BP/Wt (optional):23777}{See vitals history (optional):1}  Physical Exam   No results found for any visits on 03/18/24.  Assessment & Plan     Problem List Items Addressed This Visit   None   Assessment and Plan Assessment & Plan       No follow-ups on file.       Isaiah DELENA Pepper, MD  [facility] [phone] (phone) [phone] (fax)    [1]  Outpatient Medications Prior to Visit  Medication Sig   atorvastatin  (LIPITOR) 40 MG tablet TAKE ONE TABLET BY MOUTH DAILY   celecoxib  (CELEBREX ) 100 MG capsule Take 100 mg by mouth daily. (Patient not taking: Reported on 02/13/2024)   cetirizine  (ZYRTEC ) 10 MG tablet TAKE ONE HALF (1/2) TABLET BY MOUTH DAILY   clopidogrel  (PLAVIX ) 75 MG tablet Take 75 mg by mouth daily.   co-enzyme Q-10 30 MG capsule Take 30 mg by mouth daily.   Cranberry-Vitamin C-Probiotic (AZO CRANBERRY PO) Take 2 tablets by mouth daily at 12 noon.   dexamethasone  (DECADRON ) 1 MG tablet Take 1 mg by mouth 2 (two) times daily with a meal.   diclofenac  sodium (VOLTAREN ) 1 % GEL Apply 2 g topically 4 (four) times daily.   fluconazole  (DIFLUCAN ) 100 MG tablet Take 1 tablet (100 mg total) by mouth daily.   Krill Oil 1000 MG CAPS Take by mouth.    metoprolol  tartrate (LOPRESSOR ) 25 MG tablet TAKE HALF A TABLET (12.5 MG TOTAL) BY MOUTH TWO (TWO) TIMES DAILY.   MULTIPLE VITAMIN PO Take 1 tablet by mouth daily.   nitroGLYCERIN  (NITROSTAT ) 0.4 MG SL tablet Place under the tongue.   OneTouch Delica Lancets 33G MISC TEST FASTING GLUCOSE LEVEL EACH MORNING BEFORE BREAKFAST   pantoprazole  (PROTONIX ) 40 MG tablet Take 40 mg by mouth daily.   potassium chloride  SA (KLOR-CON  M) 20 MEQ tablet Take 1 tablet (20 mEq total) by mouth daily.   ramipril  (ALTACE ) 1.25 MG capsule Take 1.25 mg by mouth daily.   ramipril  (ALTACE ) 5 MG capsule TAKE ONE CAPSULE (5 MG TOTAL) BY MOUTH TWO (TWO) TIMES DAILY.   No facility-administered medications prior to visit.   "

## 2024-03-17 NOTE — Telephone Encounter (Signed)
" °  FYI Only or Action Required?: FYI only for provider: appointment scheduled on 1/13.  Patient was last seen in primary care on 11/26/2023 by [name], MD.  Called Nurse Triage reporting Headache.  Symptoms began today.  Interventions attempted: Nothing.  Symptoms are: gradually worsening.  Triage Disposition: See Physician Within 24 Hours  Patient/caregiver understands and will follow disposition?: Yes  Copied from CRM #[id number]. Topic: Clinical - Red Word Triage >> Mar 17, 2024  8:30 AM Lonell PEDLAR wrote: Red Word that prompted transfer to Nurse Triage: blood sugar 195 blood pressure 174/106 terrible headache, fatigue Reason for Disposition  [1] MODERATE headache (e.g., interferes with normal activities) AND [2] present > 24 hours AND [3] unexplained  (Exceptions: Pain medicines not tried, typical migraine, or headache part of viral illness.)  Answer Assessment - Initial Assessment Questions 1. LOCATION: Where does it hurt?      general 2. ONSET: When did the headache start? (e.g., minutes, hours, days)      today 3. PATTERN: Does the pain come and go, or has it been constant since it started?     constant 4. SEVERITY: How bad is the pain? and What does it keep you from doing?  (e.g., Scale 1-10; mild, moderate, or severe)     moderate 5. RECURRENT SYMPTOM: Have you ever had headaches before? If Yes, ask: When was the last time? and What happened that time?       6. CAUSE: What do you think is causing the headache?      7. MIGRAINE: Have you been diagnosed with migraine headaches? If Yes, ask: Is this headache similar?       8. HEAD INJURY: Has there been any recent injury to your head?       9. OTHER SYMPTOMS: Do you have any other symptoms? (e.g., fever, stiff neck, eye pain, sore throat, cold symptoms)     Fatigue, mild SOB, BP 174/106  Protocols used: Headache-A-AH  "

## 2024-03-18 ENCOUNTER — Telehealth: Payer: Self-pay | Admitting: Family Medicine

## 2024-03-18 ENCOUNTER — Ambulatory Visit (INDEPENDENT_AMBULATORY_CARE_PROVIDER_SITE_OTHER)

## 2024-03-18 VITALS — BP 135/94 | HR 102 | Temp 96.8°F | Wt 174.9 lb

## 2024-03-18 DIAGNOSIS — E1159 Type 2 diabetes mellitus with other circulatory complications: Secondary | ICD-10-CM

## 2024-03-18 DIAGNOSIS — G44209 Tension-type headache, unspecified, not intractable: Secondary | ICD-10-CM | POA: Diagnosis not present

## 2024-03-18 DIAGNOSIS — I152 Hypertension secondary to endocrine disorders: Secondary | ICD-10-CM | POA: Diagnosis not present

## 2024-03-18 DIAGNOSIS — E1169 Type 2 diabetes mellitus with other specified complication: Secondary | ICD-10-CM | POA: Diagnosis not present

## 2024-03-18 LAB — POCT GLYCOSYLATED HEMOGLOBIN (HGB A1C): Hemoglobin A1C: 7 % — AB (ref 4.0–5.6)

## 2024-03-18 MED ORDER — RAMIPRIL 5 MG PO CAPS: 5.0000 mg | ORAL_CAPSULE | Freq: Every day | ORAL | 3 refills | Status: DC

## 2024-03-18 MED ORDER — METOPROLOL TARTRATE 25 MG PO TABS: 25.0000 mg | ORAL_TABLET | Freq: Two times a day (BID) | ORAL | 3 refills | Status: DC

## 2024-03-18 MED ORDER — RAMIPRIL 2.5 MG PO CAPS: 2.5000 mg | ORAL_CAPSULE | Freq: Every day | ORAL | 3 refills | Status: AC

## 2024-03-18 MED ORDER — METOPROLOL TARTRATE 25 MG PO TABS: 25.0000 mg | ORAL_TABLET | Freq: Two times a day (BID) | ORAL | 3 refills | Status: AC

## 2024-03-18 NOTE — Telephone Encounter (Signed)
 Copied from CRM [id number]. Topic: Clinical - Prescription Issue >> Mar 18, 2024  9:22 AM [facility] wrote: Reason for CRM: [place] w/[place] [phone] Do you want him to have 90 tablets?  But with 2 times daily - requires 180? Pls call or resend

## 2024-03-18 NOTE — Patient Instructions (Addendum)
 Please discuss stopping your roflumilast with your lung doctor.  Please increase metoprolol  to 25mg  (1 tablet) twice daily to help lower your blood pressure and heart rate.

## 2024-03-18 NOTE — Telephone Encounter (Signed)
 This doesn't say what medication the question is about

## 2024-03-18 NOTE — Telephone Encounter (Signed)
 Rx updated and pharmacy has confirmed it was received

## 2024-03-20 ENCOUNTER — Telehealth: Payer: Self-pay | Admitting: Oncology

## 2024-03-20 NOTE — Telephone Encounter (Signed)
 Pt is scheduled for port flush on 1/19 and then port labs/ seeing dr. Melanee on 1/23. I called and lvm with pt to tell him we would be cancelling the 1/19 port flush appt. He does not need to come and have his port accessed twice in one week. I told him we would keep the appts on 1/23. I asked him to call me back If he had any questions.

## 2024-03-23 ENCOUNTER — Inpatient Hospital Stay

## 2024-03-27 ENCOUNTER — Encounter: Payer: Self-pay | Admitting: Oncology

## 2024-03-27 ENCOUNTER — Inpatient Hospital Stay: Attending: Oncology

## 2024-03-27 ENCOUNTER — Inpatient Hospital Stay: Attending: Oncology | Admitting: Oncology

## 2024-03-27 VITALS — BP 132/84 | HR 98 | Temp 96.0°F | Resp 19 | Ht 69.0 in | Wt 172.6 lb

## 2024-03-27 DIAGNOSIS — Z08 Encounter for follow-up examination after completed treatment for malignant neoplasm: Secondary | ICD-10-CM

## 2024-03-27 DIAGNOSIS — C3411 Malignant neoplasm of upper lobe, right bronchus or lung: Secondary | ICD-10-CM | POA: Diagnosis not present

## 2024-03-27 DIAGNOSIS — Z85118 Personal history of other malignant neoplasm of bronchus and lung: Secondary | ICD-10-CM | POA: Diagnosis not present

## 2024-03-27 LAB — CBC WITH DIFFERENTIAL (CANCER CENTER ONLY)
Abs Immature Granulocytes: 0.12 K/uL — ABNORMAL HIGH (ref 0.00–0.07)
Basophils Absolute: 0 K/uL (ref 0.0–0.1)
Basophils Relative: 0 %
Eosinophils Absolute: 0 K/uL (ref 0.0–0.5)
Eosinophils Relative: 0 %
HCT: 44 % (ref 39.0–52.0)
Hemoglobin: 14.9 g/dL (ref 13.0–17.0)
Immature Granulocytes: 1 %
Lymphocytes Relative: 8 %
Lymphs Abs: 0.9 K/uL (ref 0.7–4.0)
MCH: 30.6 pg (ref 26.0–34.0)
MCHC: 33.9 g/dL (ref 30.0–36.0)
MCV: 90.3 fL (ref 80.0–100.0)
Monocytes Absolute: 0.9 K/uL (ref 0.1–1.0)
Monocytes Relative: 8 %
Neutro Abs: 9.5 K/uL — ABNORMAL HIGH (ref 1.7–7.7)
Neutrophils Relative %: 83 %
Platelet Count: 197 K/uL (ref 150–400)
RBC: 4.87 MIL/uL (ref 4.22–5.81)
RDW: 14.9 % (ref 11.5–15.5)
WBC Count: 11.4 K/uL — ABNORMAL HIGH (ref 4.0–10.5)
nRBC: 0 % (ref 0.0–0.2)

## 2024-03-27 LAB — CMP (CANCER CENTER ONLY)
ALT: 35 U/L (ref 0–44)
AST: 24 U/L (ref 15–41)
Albumin: 4.2 g/dL (ref 3.5–5.0)
Alkaline Phosphatase: 63 U/L (ref 38–126)
Anion gap: 14 (ref 5–15)
BUN: 19 mg/dL (ref 8–23)
CO2: 21 mmol/L — ABNORMAL LOW (ref 22–32)
Calcium: 9.7 mg/dL (ref 8.9–10.3)
Chloride: 101 mmol/L (ref 98–111)
Creatinine: 0.99 mg/dL (ref 0.61–1.24)
GFR, Estimated: >60 mL/min
Glucose, Bld: 161 mg/dL — ABNORMAL HIGH (ref 70–99)
Potassium: 3.8 mmol/L (ref 3.5–5.1)
Sodium: 136 mmol/L (ref 135–145)
Total Bilirubin: 0.6 mg/dL (ref 0.0–1.2)
Total Protein: 6.9 g/dL (ref 6.5–8.1)

## 2024-03-27 NOTE — Progress Notes (Signed)
 "    Hematology/Oncology Consult note [facility]  [phone] [phone]  Patient Care Team: [name], MD as PCP - General (Family Medicine) [name], OD as Consulting Physician (Optometry) [name], MD as Consulting Physician (Cardiology) [name], RN as Oncology Nurse Navigator [name], [name], MD (Urology) [name], MD as Consulting Physician (Oncology) [name], MD as Consulting Physician (Pulmonary Disease) [name], MD as Referring Physician (Vascular Surgery) [name], MD as Consulting Physician (Radiation Oncology)   Name of the patient: Luke Rojas  [id number]  Jun 19, 1945   Date of visit: 03/27/24  Diagnosis- 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  He quit smoking about 30 years ago but still chews tobacco.  He presented to the ER with symptoms of neck pain which led to an MRI cervical spine as well as CT angio chest.  MRI cervical spine showed degenerative changes along with moderate spinal stenosis and mass effect between C3-C4 and C4-C5.  Foraminal stenosis at C3-C4 C5-C6-C7 nerve levels.  Nonspecific 10 mm T3 vertebral body lesion indeterminate for bone metastases.  CT angio chest showed a 4.5 x 4.9 x 3.7 cm right upper lobe peripheral mass with probable pleural involvement and potential early invasion into right middle lobe.  Borderline enlarged right hilar lymph nodes measuring up to 1.2 cm.   PET CT scan showed hypermetabolic 4.8 cm right upper lobe mass consistent with primary bronchogenic carcinoma and mild asymmetric FDG activity in the right suprahilar region suspicious for right hilar lymph node metastases.  Patient had a  CT-guided right upper lobe lung biopsy which was consistent with squamous cell carcinoma.  Completed concurrent chemoradiation with weekly CarboTaxol in September 2023.CT angio chest in May 2025 which was done for worsening shortness of breath showed interval increase in the size of the right upper lobe mass from 2 cm to 4.9 cm.  This was followed by a PET CT scan on 07/19/2023 which showed SUV uptake of 12.5 in the right upper lobe lung mass measuring 4.6 x 4.5 cm.  Some uptake within scattered mediastinal lymph nodes which have remained unchanged since November 2024 with an SUV uptake of 3.1.  Patient underwent bronchoscopy by Dr. Genna Melanee on 08/02/2023.  Right upper lobe lung lavage was positive for squamous cell carcinoma and FNA was positive for squamous cell carcinoma well.  FNA from station 4R 10L and lymph node station 7 negative for malignancy.   Patient was noted to have concern for recurrent disease involving the right upper lobe in June 2025 with increase in the size of the mass from 2 cm to 4.6 cm with an SUV of 12.5.  He underwent SBRT for 10 fractions    Interval history- Luke Rojas is a 79 year old male with right upper lobe non-small cell lung cancer, status post chemoradiation and recent re-irradiation, who presents with new left-sided tingling and worsening balance.  He completed initial chemoradiation for right upper lobe non-small cell lung cancer, followed by 15 fractions of higher-dose radiation for suspected local recurrence. Since completing the most recent radiation course, he has experienced persistent decline in overall well-being, worsening balance, and increased reliance on a cane. He ambulates independently but prefers to have a wall nearby for support  due to instability.  He reports new-onset tingling occurring several times daily, localized to the left side and radiating to the back of his neck. The sensation is described as tingling only, without associated  pain. He denies chest pain, shortness of breath, or other acute cardiopulmonary symptoms. He has not discussed these neurological symptoms with his primary care provider or other recent providers.  The patient reports that he was told his recent cardiac evaluation was normal. His last cardiology visit was in August 2024, and he saw his primary care provider in September 2025 and January 2026 for unrelated issues, without mention of his current neurological symptoms.       ECOG PS- 2 Pain scale- 0   Review of systems- Review of Systems  Constitutional:  Positive for malaise/fatigue. Negative for chills, fever and weight loss.  HENT:  Negative for congestion, ear discharge and nosebleeds.   Eyes:  Negative for blurred vision.  Respiratory:  Negative for cough, hemoptysis, sputum production, shortness of breath and wheezing.   Cardiovascular:  Negative for chest pain, palpitations, orthopnea and claudication.  Gastrointestinal:  Negative for abdominal pain, blood in stool, constipation, diarrhea, heartburn, melena, nausea and vomiting.  Genitourinary:  Negative for dysuria, flank pain, frequency, hematuria and urgency.  Musculoskeletal:  Negative for back pain, joint pain and myalgias.  Skin:  Negative for rash.  Neurological:  Positive for sensory change (Tingling sensation in his left arm). Negative for dizziness, tingling, focal weakness, seizures, weakness and headaches.       Gait instability  Endo/Heme/Allergies:  Does not bruise/bleed easily.  Psychiatric/Behavioral:  Negative for depression and suicidal ideas. The patient does not have insomnia.       Allergies[1]   Past Medical History:  Diagnosis Date   Aortic atherosclerosis    Arthritis    Bilateral carotid artery disease    a.) s/p PTA/stenting 09/11/2021: 9 mm x 7 mm x 4 cm Exact stent to RIGHT   BPH (benign prostatic hyperplasia)    Chronic kidney disease    COPD (chronic obstructive pulmonary disease) (HCC)     Coronary artery disease    a.) PCI 12/11/2010: 99% pRCA (3 x 15 mm Xience V DES); b.) staged PCI 01/05/2011: 80 % pLCx (3.5 x 23 mm Xience V DES); c.) PCI 12/19/2018: 75% ISR pRCA (3 x 16 mm Synergy DES), 85% mRCA (3 x 20 mm Synergy DES), 95% mRCA (3 x 16 mm Synergy), 85% p-mLAD (rotational athrectomy + overlapping 3 x 24 mm and 3 x 28 mm Synergy DES)   DDD (degenerative disc disease), cervical    Diverticulosis    Dyspnea    Full dentures    GERD (gastroesophageal reflux disease)    History of heart artery stent 12/11/2010   a.) TOTAL of 7 coronary stents as of 08/01/2023   Hypercholesteremia    Hypertension    Hypokalemia    Long term current use of clopidogrel     Nephrolithiasis    NSTEMI (non-ST elevated myocardial infarction) (HCC) 12/10/2010   a.) PCI 12/11/2010: 99% pRCA (3 x 15 mm Xience V DES); b.) staged PCI 01/05/2011: 80 % pLCx (3.5 x 23 mm Xience V DES)   On long term clopidogrel  therapy    Pneumonia    Renal colic    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   a.) stage II B (cT2, cN1, cM0) s/p concurrent chemoradiation (weekly CarboTaxol + 70 cGy over 7 weeks; completed 12/2021)   T2DM (type  2 diabetes mellitus) (HCC)      Past Surgical History:  Procedure Laterality Date   BRONCHIAL WASHINGS N/A 02/14/2024   Procedure: IRRIGATION, BRONCHUS;  Surgeon: [name], MD;  Location: [facility];  Service: Thoracic;  Laterality: N/A;   CAROTID PTA/STENT INTERVENTION Right 09/11/2021   Procedure: CAROTID PTA/STENT INTERVENTION;  Surgeon: [name], MD;  Location: [facility];  Service: Cardiovascular;  Laterality: Right;   CATARACT EXTRACTION, BILATERAL     COLONOSCOPY  04/27/2005   COLONOSCOPY WITH PROPOFOL  N/A 06/08/2015   Procedure: COLONOSCOPY WITH PROPOFOL ;  Surgeon: Reyes LELON Cota, MD;  Location: [facility];  Service: Endoscopy;  Laterality: N/A;   CORONARY ANGIOPLASTY WITH STENT PLACEMENT Left 12/11/2010   Procedure:  CORONARY ANGIOPLASTY WITH STENT PLACEMENT; Location: [facility]; Surgeon: Margie Lovelace, MD   CORONARY ANGIOPLASTY WITH STENT PLACEMENT Left 12/19/2018   Procedure: CORONARY ANGIOPLASTY WITH STENT PLACEMENT; Location: [facility]   CORONARY STENT INTERVENTION Left 01/05/2011   Procedure: CORONARY STENT INTERVENTION; Location: [facility]; Surgeon: Margie Lovelace, MD   CYSTOSCOPY WITH INSERTION OF UROLIFT N/A 02/11/2020   Procedure: CYSTOSCOPY WITH INSERTION OF UROLIFT;  Surgeon: Kassie Ozell SAUNDERS, MD;  Location: [facility];  Service: Urology;  Laterality: N/A;   CYSTOSCOPY WITH STENT PLACEMENT Right 09/16/2016   Procedure: CYSTOSCOPY WITH STENT PLACEMENT;  Surgeon: Kassie Ozell SAUNDERS, MD;  Location: [facility];  Service: Urology;  Laterality: Right;   EXTRACORPOREAL SHOCK WAVE LITHOTRIPSY Left 11/04/2014   has had 2 previous lithotripsies   EXTRACORPOREAL SHOCK WAVE LITHOTRIPSY Left 03/24/2015   Procedure: EXTRACORPOREAL SHOCK WAVE LITHOTRIPSY (ESWL);  Surgeon: Ozell SAUNDERS Kassie, MD;  Location: [facility];  Service: Urology;  Laterality: Left;   EXTRACORPOREAL SHOCK WAVE LITHOTRIPSY Right 10/04/2016   Procedure: EXTRACORPOREAL SHOCK WAVE LITHOTRIPSY (ESWL);  Surgeon: Kassie Ozell SAUNDERS, MD;  Location: [facility];  Service: Urology;  Laterality: Right;   FLEXIBLE BRONCHOSCOPY N/A 02/14/2024   Procedure: BRONCHOSCOPY, FLEXIBLE;  Surgeon: [name], MD;  Location: [facility];  Service: Thoracic;  Laterality: N/A;   LEFT HEART CATH AND CORONARY ANGIOGRAPHY N/A 12/05/2018   Procedure: LEFT HEART CATH AND CORONARY ANGIOGRAPHY with possible pci and stent;  Surgeon: Lovelace Cara BIRCH, MD;  Location: [facility];  Service: Cardiovascular;  Laterality: N/A;   PORTA CATH INSERTION Right 09/25/2021   Procedure: PORTA CATH INSERTION;  Surgeon: [name], MD;  Location: [facility];  Service: Cardiovascular;  Laterality: Right;   VIDEO BRONCHOSCOPY WITH ENDOBRONCHIAL NAVIGATION N/A 08/02/2023   Procedure: VIDEO  BRONCHOSCOPY WITH ENDOBRONCHIAL NAVIGATION;  Surgeon: [name], MD;  Location: [facility];  Service: Thoracic;  Laterality: N/A;   VIDEO BRONCHOSCOPY WITH ENDOBRONCHIAL ULTRASOUND N/A 08/02/2023   Procedure: BRONCHOSCOPY, WITH EBUS;  Surgeon: [name], MD;  Location: [facility];  Service: Thoracic;  Laterality: N/A;    Social History   Socioeconomic History   Marital status: Married    Spouse name: Shawnee   Number of children: 2   Years of education: Not on file   Highest education level: 8th grade  Occupational History   Occupation: retired  Tobacco Use   Smoking status: Former    Current packs/day: 0.00    Average packs/day: 1 pack/day for 30.0 years (30.0 ttl pk-yrs)    Types: Cigarettes    Start date: 03/05/1954    Quit date: 03/05/1984    Years since quitting: 40.0   Smokeless tobacco: Current    Types: Chew   Tobacco comments:    Chews tobacco daily.  Vaping Use   Vaping status: Never Used  Substance and Sexual Activity   Alcohol use: No    Alcohol/week: 0.0 standard drinks of alcohol   Drug use: No   Sexual activity: Not Currently  Other Topics Concern   Not on file  Social History Narrative   Lives with Shawnee, wife, and son Chad.   Social Drivers of Health   Tobacco Use: High Risk (03/27/2024)   Patient History    Smoking Tobacco Use: Former    Smokeless Tobacco Use: Current    Passive Exposure: Not on file  Financial Resource Strain: Low Risk (05/01/2023)   Overall Financial Resource Strain (CARDIA)    Difficulty of Paying Living Expenses: Not hard at all  Food Insecurity: No Food Insecurity (05/01/2023)   Hunger Vital Sign    Worried About Running Out of Food in the Last Year: Never true    Ran Out of Food in the Last Year: Never true  Transportation Needs: No Transportation Needs (05/01/2023)   PRAPARE - Administrator, Civil Service (Medical): No    Lack of Transportation (Non-Medical): No  Physical Activity: Insufficiently Active  (05/01/2023)   Exercise Vital Sign    Days of Exercise per Week: 1 day    Minutes of Exercise per Session: 90 min  Stress: No Stress Concern Present (05/01/2023)   [employer] of Occupational Health - Occupational Stress Questionnaire    Feeling of Stress : Not at all  Social Connections: Moderately Integrated (05/01/2023)   Social Connection and Isolation Panel    Frequency of Communication with Friends and Family: More than three times a week    Frequency of Social Gatherings with Friends and Family: More than three times a week    Attends Religious Services: More than 4 times per year    Active Member of Clubs or Organizations: No    Attends Banker Meetings: Never    Marital Status: Married  Catering Manager Violence: Not At Risk (05/01/2023)   Humiliation, Afraid, Rape, and Kick questionnaire    Fear of Current or Ex-Partner: No    Emotionally Abused: No    Physically Abused: No    Sexually Abused: No  Depression (PHQ2-9): Low Risk (03/27/2024)   Depression (PHQ2-9)    PHQ-2 Score: 0  Recent Concern: Depression (PHQ2-9) - Medium Risk (03/18/2024)   Depression (PHQ2-9)    PHQ-2 Score: 5  Alcohol Screen: Low Risk (05/28/2023)   Alcohol Screen    Last Alcohol Screening Score (AUDIT): 0  Housing: Low Risk (05/01/2023)   Housing Stability Vital Sign    Unable to Pay for Housing in the Last Year: No    Number of Times Moved in the Last Year: 0    Homeless in the Last Year: No  Utilities: Not At Risk (05/01/2023)   AHC Utilities    Threatened with loss of utilities: No  Health Literacy: Adequate Health Literacy (05/01/2023)   B1300 Health Literacy    Frequency of need for help with medical instructions: Never    Family History  Problem Relation Age of Onset   Pulmonary embolism Mother    Transient ischemic attack Mother    Diabetes Mother    Pancreatic cancer Father    Hypertension Father    Diabetes Father    Cirrhosis Brother     Current  Medications[2]  Physical exam:  Vitals:   03/27/24 1457  BP: 132/84  Pulse: 98  Resp: 19  Temp: (!) 96 F (  35.6 C)  TempSrc: Tympanic  SpO2: 100%  Weight: 172 lb 9.6 oz (78.3 kg)  Height: 5' 9 (1.753 m)   Physical Exam Constitutional:      Comments: Ambulates with a cane appears in no acute distress  Cardiovascular:     Rate and Rhythm: Normal rate and regular rhythm.     Heart sounds: Normal heart sounds.  Pulmonary:     Effort: Pulmonary effort is normal.     Breath sounds: Normal breath sounds.  Skin:    General: Skin is warm and dry.  Neurological:     Mental Status: He is alert and oriented to person, place, and time.      I have personally reviewed labs listed below:    Latest Ref Rng & Units 03/27/2024    2:02 PM  CMP  Glucose 70 - 99 mg/dL 838   BUN 8 - 23 mg/dL 19   Creatinine 9.38 - 1.24 mg/dL 9.00   Sodium 864 - 854 mmol/L 136   Potassium 3.5 - 5.1 mmol/L 3.8   Chloride 98 - 111 mmol/L 101   CO2 22 - 32 mmol/L 21   Calcium  8.9 - 10.3 mg/dL 9.7   Total Protein 6.5 - 8.1 g/dL 6.9   Total Bilirubin 0.0 - 1.2 mg/dL 0.6   Alkaline Phos 38 - 126 U/L 63   AST 15 - 41 U/L 24   ALT 0 - 44 U/L 35       Latest Ref Rng & Units 03/27/2024    2:02 PM  CBC  WBC 4.0 - 10.5 K/uL 11.4   Hemoglobin 13.0 - 17.0 g/dL 85.0   Hematocrit 60.9 - 52.0 % 44.0   Platelets 150 - 400 K/uL 197       Assessment and plan- Patient is a 79 y.o. male with history of stage IIb squamous cell carcinoma of the right upper lobe s/p concurrent chemoradiation.  Recurrent disease noted in June 2025 s/p SBRT.He is here for a routine follow-up visit.  CT chest abdomen and pelvis with contrast in December 2025 showed decrease in the size of the right upper lobe mass.  Evolving radiation changes and no evidence of progressive or metastatic disease elsewhere.  I will plan to get a repeat CT chest abdomen and pelvis with contrast in 4 months and see him 2 weeks post CT scan  Left arm  tingling sensation: Could be musculoskeletal in etiology and nerve impingement.  I am forwarding my note to Dr. Franchot, his pcp who saw him recently for any further evaluation if need be.  CT scan in December 2025 did not show any evidence of osseous abnormality.  Also discussed with the patient that left arm tingling could be potentially cardiac in etiology.  He was seen by cardiology clinic in November 2025 and had echocardiogram in December which did not show any evidence of wall motion abnormality.  Patient understands that if his symptoms persist he needs to speak to his primary care doctor.  He states that his symptoms have been ongoing for the last 2 to 3 months but he forgot to mention to his PCP   Visit Diagnosis 1. Encounter for follow-up surveillance of lung cancer      Dr. Annah Skene, MD, MPH [facility] [phone] 03/27/2024 3:03 PM                   [1]  Allergies Allergen Reactions   Codeine Nausea And Vomiting and  Nausea Only  [2]  Current Outpatient Medications:    albuterol  (PROVENTIL ) (2.5 MG/3ML) 0.083% nebulizer solution, Inhale 2.5 mg into the lungs., Disp: , Rfl:    albuterol  (VENTOLIN  HFA) 108 (90 Base) MCG/ACT inhaler, Inhale 2 puffs into the lungs., Disp: , Rfl:    atorvastatin  (LIPITOR) 40 MG tablet, TAKE ONE TABLET BY MOUTH DAILY, Disp: 90 tablet, Rfl: 3   cetirizine  (ZYRTEC ) 10 MG tablet, TAKE ONE HALF (1/2) TABLET BY MOUTH DAILY, Disp: 45 tablet, Rfl: 0   clopidogrel  (PLAVIX ) 75 MG tablet, Take 75 mg by mouth daily., Disp: , Rfl:    co-enzyme Q-10 30 MG capsule, Take 30 mg by mouth daily., Disp: , Rfl:    Cranberry-Vitamin C-Probiotic (AZO CRANBERRY PO), Take 2 tablets by mouth daily at 12 noon., Disp: , Rfl:    dexamethasone  (DECADRON ) 1 MG tablet, Take 1 mg by mouth 2 (two) times daily with a meal., Disp: , Rfl:    diclofenac  sodium (VOLTAREN ) 1 % GEL, Apply 2 g topically 4 (four) times daily., Disp: 100 g, Rfl: 3    Krill Oil 1000 MG CAPS, Take by mouth., Disp: , Rfl:    metoprolol  tartrate (LOPRESSOR ) 25 MG tablet, Take 1 tablet (25 mg total) by mouth 2 (two) times daily., Disp: 180 tablet, Rfl: 3   MULTIPLE VITAMIN PO, Take 1 tablet by mouth daily., Disp: , Rfl:    nitroGLYCERIN  (NITROSTAT ) 0.4 MG SL tablet, Place under the tongue., Disp: , Rfl:    OneTouch Delica Lancets 33G MISC, TEST FASTING GLUCOSE LEVEL EACH MORNING BEFORE BREAKFAST, Disp: 100 each, Rfl: 4   pantoprazole  (PROTONIX ) 40 MG tablet, Take 40 mg by mouth daily., Disp: , Rfl:    potassium chloride  SA (KLOR-CON  M) 20 MEQ tablet, Take 1 tablet (20 mEq total) by mouth daily., Disp: 7 tablet, Rfl: 0   ramipril  (ALTACE ) 2.5 MG capsule, Take 1 capsule (2.5 mg total) by mouth daily., Disp: 90 capsule, Rfl: 3   Roflumilast 250 MCG TABS, Take 250 mcg by mouth daily at 12 noon., Disp: , Rfl:   "

## 2024-03-27 NOTE — Progress Notes (Signed)
 Patient has some concerns to address during today's visit.

## 2024-04-10 ENCOUNTER — Other Ambulatory Visit

## 2024-04-10 ENCOUNTER — Ambulatory Visit: Admitting: Oncology

## 2024-04-16 ENCOUNTER — Ambulatory Visit: Admitting: Family Medicine

## 2024-05-06 ENCOUNTER — Ambulatory Visit: Payer: Medicare Other

## 2024-05-21 ENCOUNTER — Inpatient Hospital Stay

## 2024-05-25 ENCOUNTER — Inpatient Hospital Stay

## 2024-05-28 ENCOUNTER — Encounter: Admitting: Family Medicine

## 2024-07-17 ENCOUNTER — Ambulatory Visit

## 2024-07-21 ENCOUNTER — Inpatient Hospital Stay

## 2024-07-24 ENCOUNTER — Inpatient Hospital Stay

## 2024-07-24 ENCOUNTER — Inpatient Hospital Stay: Admitting: Oncology

## 2024-08-24 ENCOUNTER — Ambulatory Visit

## 2024-08-31 ENCOUNTER — Ambulatory Visit: Admitting: Radiation Oncology

## 2024-09-21 ENCOUNTER — Inpatient Hospital Stay: Attending: Oncology

## 2025-01-15 ENCOUNTER — Ambulatory Visit (INDEPENDENT_AMBULATORY_CARE_PROVIDER_SITE_OTHER): Admitting: Nurse Practitioner

## 2025-01-15 ENCOUNTER — Encounter (INDEPENDENT_AMBULATORY_CARE_PROVIDER_SITE_OTHER)
